# Patient Record
Sex: Female | Born: 1950
Health system: Southern US, Community
[De-identification: ages and names within clinical notes are randomized; demographics above are authoritative.]

## PROBLEM LIST (undated history)

## (undated) DIAGNOSIS — R197 Diarrhea, unspecified: Secondary | ICD-10-CM

## (undated) DIAGNOSIS — Z95828 Presence of other vascular implants and grafts: Secondary | ICD-10-CM

## (undated) DIAGNOSIS — D509 Iron deficiency anemia, unspecified: Secondary | ICD-10-CM

## (undated) DIAGNOSIS — I700 Atherosclerosis of aorta: Secondary | ICD-10-CM

## (undated) DIAGNOSIS — C539 Malignant neoplasm of cervix uteri, unspecified: Secondary | ICD-10-CM

## (undated) DIAGNOSIS — F329 Major depressive disorder, single episode, unspecified: Secondary | ICD-10-CM

## (undated) DIAGNOSIS — M8580 Other specified disorders of bone density and structure, unspecified site: Secondary | ICD-10-CM

## (undated) DIAGNOSIS — N83209 Unspecified ovarian cyst, unspecified side: Secondary | ICD-10-CM

## (undated) DIAGNOSIS — Z923 Personal history of irradiation: Secondary | ICD-10-CM

## (undated) DIAGNOSIS — E876 Hypokalemia: Secondary | ICD-10-CM

## (undated) DIAGNOSIS — R630 Anorexia: Secondary | ICD-10-CM

## (undated) DIAGNOSIS — Z9221 Personal history of antineoplastic chemotherapy: Secondary | ICD-10-CM

## (undated) DIAGNOSIS — D61818 Other pancytopenia: Secondary | ICD-10-CM

## (undated) HISTORY — PX: OTHER SURGICAL HISTORY: SHX169

## (undated) HISTORY — PX: WISDOM TOOTH EXTRACTION: SHX21

---

## 1999-04-15 ENCOUNTER — Encounter: Admission: RE | Admit: 1999-04-15 | Discharge: 1999-04-15 | Payer: Self-pay | Admitting: Obstetrics & Gynecology

## 1999-05-14 ENCOUNTER — Ambulatory Visit (HOSPITAL_COMMUNITY): Admission: RE | Admit: 1999-05-14 | Discharge: 1999-05-14 | Payer: Self-pay | Admitting: Obstetrics

## 2004-09-02 ENCOUNTER — Encounter (INDEPENDENT_AMBULATORY_CARE_PROVIDER_SITE_OTHER): Payer: Self-pay | Admitting: Internal Medicine

## 2004-09-02 LAB — CONVERTED CEMR LAB: Pap Smear: NORMAL

## 2004-10-09 ENCOUNTER — Ambulatory Visit: Payer: Self-pay | Admitting: *Deleted

## 2004-10-09 ENCOUNTER — Ambulatory Visit: Payer: Self-pay | Admitting: Family Medicine

## 2004-10-14 ENCOUNTER — Ambulatory Visit (HOSPITAL_COMMUNITY): Admission: RE | Admit: 2004-10-14 | Discharge: 2004-10-14 | Payer: Self-pay | Admitting: Family Medicine

## 2004-10-14 DIAGNOSIS — N83209 Unspecified ovarian cyst, unspecified side: Secondary | ICD-10-CM | POA: Insufficient documentation

## 2004-10-16 ENCOUNTER — Ambulatory Visit: Payer: Self-pay | Admitting: Family Medicine

## 2004-10-23 ENCOUNTER — Ambulatory Visit: Payer: Self-pay | Admitting: Family Medicine

## 2004-11-20 ENCOUNTER — Ambulatory Visit: Payer: Self-pay | Admitting: Family Medicine

## 2006-11-08 ENCOUNTER — Emergency Department (HOSPITAL_COMMUNITY): Admission: EM | Admit: 2006-11-08 | Discharge: 2006-11-08 | Payer: Self-pay | Admitting: Emergency Medicine

## 2007-04-15 ENCOUNTER — Ambulatory Visit: Payer: Self-pay | Admitting: Internal Medicine

## 2007-04-15 DIAGNOSIS — A5901 Trichomonal vulvovaginitis: Secondary | ICD-10-CM

## 2007-04-16 ENCOUNTER — Encounter (INDEPENDENT_AMBULATORY_CARE_PROVIDER_SITE_OTHER): Payer: Self-pay | Admitting: Internal Medicine

## 2007-04-16 LAB — CONVERTED CEMR LAB: Chlamydia, Swab/Urine, PCR: NEGATIVE

## 2007-04-22 ENCOUNTER — Encounter (INDEPENDENT_AMBULATORY_CARE_PROVIDER_SITE_OTHER): Payer: Self-pay | Admitting: Internal Medicine

## 2007-07-29 ENCOUNTER — Emergency Department (HOSPITAL_COMMUNITY): Admission: EM | Admit: 2007-07-29 | Discharge: 2007-07-30 | Payer: Self-pay | Admitting: Emergency Medicine

## 2008-05-15 ENCOUNTER — Encounter (INDEPENDENT_AMBULATORY_CARE_PROVIDER_SITE_OTHER): Payer: Self-pay | Admitting: Internal Medicine

## 2008-05-24 ENCOUNTER — Ambulatory Visit: Payer: Self-pay | Admitting: Internal Medicine

## 2008-05-24 DIAGNOSIS — K029 Dental caries, unspecified: Secondary | ICD-10-CM | POA: Insufficient documentation

## 2008-05-25 ENCOUNTER — Encounter (INDEPENDENT_AMBULATORY_CARE_PROVIDER_SITE_OTHER): Payer: Self-pay | Admitting: Internal Medicine

## 2008-10-02 ENCOUNTER — Ambulatory Visit: Payer: Self-pay | Admitting: Internal Medicine

## 2008-10-02 DIAGNOSIS — K921 Melena: Secondary | ICD-10-CM

## 2008-10-02 DIAGNOSIS — M4000 Postural kyphosis, site unspecified: Secondary | ICD-10-CM | POA: Insufficient documentation

## 2008-10-02 LAB — CONVERTED CEMR LAB
ALT: 8 units/L (ref 0–35)
AST: 13 units/L (ref 0–37)
Albumin: 4 g/dL (ref 3.5–5.2)
Alkaline Phosphatase: 69 units/L (ref 39–117)
Basophils Absolute: 0 10*3/uL (ref 0.0–0.1)
Basophils Relative: 1 % (ref 0–1)
Bilirubin Urine: NEGATIVE
Blood in Urine, dipstick: NEGATIVE
Calcium: 8.8 mg/dL (ref 8.4–10.5)
Chlamydia, DNA Probe: NEGATIVE
Chloride: 107 meq/L (ref 96–112)
Eosinophils Absolute: 0.1 10*3/uL (ref 0.0–0.7)
Glucose, Urine, Semiquant: NEGATIVE
HDL: 65 mg/dL (ref >39)
KOH Prep: NEGATIVE
LDL Cholesterol: 101 mg/dL — ABNORMAL HIGH (ref 0–99)
MCHC: 31.7 g/dL (ref 30.0–36.0)
MCV: 92 fL (ref 78.0–100.0)
Neutro Abs: 3.1 10*3/uL (ref 1.7–7.7)
Neutrophils Relative %: 55 % (ref 43–77)
Platelets: 204 10*3/uL (ref 150–400)
Potassium: 3.9 meq/L (ref 3.5–5.3)
Protein, U semiquant: NEGATIVE
Sodium: 143 meq/L (ref 135–145)
Specific Gravity, Urine: 1.015
Total Protein: 6.6 g/dL (ref 6.0–8.3)
WBC Urine, dipstick: NEGATIVE
Whiff Test: NEGATIVE

## 2008-10-10 ENCOUNTER — Encounter (INDEPENDENT_AMBULATORY_CARE_PROVIDER_SITE_OTHER): Payer: Self-pay | Admitting: Internal Medicine

## 2008-10-10 ENCOUNTER — Ambulatory Visit (HOSPITAL_COMMUNITY): Admission: RE | Admit: 2008-10-10 | Discharge: 2008-10-10 | Payer: Self-pay | Admitting: Internal Medicine

## 2008-10-10 DIAGNOSIS — M810 Age-related osteoporosis without current pathological fracture: Secondary | ICD-10-CM | POA: Insufficient documentation

## 2009-02-14 ENCOUNTER — Encounter (INDEPENDENT_AMBULATORY_CARE_PROVIDER_SITE_OTHER): Payer: Self-pay | Admitting: Internal Medicine

## 2009-02-14 ENCOUNTER — Telehealth (INDEPENDENT_AMBULATORY_CARE_PROVIDER_SITE_OTHER): Payer: Self-pay | Admitting: Internal Medicine

## 2012-01-25 ENCOUNTER — Encounter (HOSPITAL_COMMUNITY): Payer: Self-pay | Admitting: *Deleted

## 2012-01-25 ENCOUNTER — Emergency Department (HOSPITAL_COMMUNITY): Payer: No Typology Code available for payment source

## 2012-01-25 ENCOUNTER — Emergency Department (HOSPITAL_COMMUNITY)
Admission: EM | Admit: 2012-01-25 | Discharge: 2012-01-25 | Disposition: A | Discharge status: Home / Self Care | Payer: No Typology Code available for payment source | Attending: Emergency Medicine | Admitting: Emergency Medicine

## 2012-01-25 DIAGNOSIS — S301XXA Contusion of abdominal wall, initial encounter: Secondary | ICD-10-CM | POA: Insufficient documentation

## 2012-01-25 DIAGNOSIS — R51 Headache: Secondary | ICD-10-CM | POA: Insufficient documentation

## 2012-01-25 DIAGNOSIS — R079 Chest pain, unspecified: Secondary | ICD-10-CM | POA: Insufficient documentation

## 2012-01-25 MED ORDER — IOHEXOL 300 MG/ML  SOLN
80.0000 mL | Freq: Once | INTRAMUSCULAR | Status: AC | PRN
  Administered 2012-01-25: 80 mL via INTRAVENOUS

## 2012-01-25 MED ORDER — IOHEXOL 300 MG/ML  SOLN: 80.0000 mL | Freq: Once | INTRAMUSCULAR | Status: DC | PRN

## 2012-01-25 MED ORDER — HYDROCODONE-ACETAMINOPHEN 5-325 MG PO TABS: 1.0000 | ORAL_TABLET | ORAL | Status: AC | PRN

## 2012-01-25 MED ORDER — MORPHINE SULFATE 2 MG/ML IJ SOLN
2.0000 mg | Freq: Once | INTRAMUSCULAR | Status: AC
  Administered 2012-01-25: 2 mg via INTRAVENOUS
  Filled 2012-01-25: qty 1
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
## 2012-01-27 NOTE — ED Provider Notes (Signed)
Medical screening examination/treatment/procedure(s) were performed by non-physician practitioner and as supervising physician I was immediately available for consultation/collaboration.  Delvina Mizzell R. Terisa Belardo, MD 01/27/12 1535 

## 2014-01-10 ENCOUNTER — Emergency Department (INDEPENDENT_AMBULATORY_CARE_PROVIDER_SITE_OTHER)
Admission: EM | Admit: 2014-01-10 | Discharge: 2014-01-10 | Disposition: A | Discharge status: Home / Self Care | Payer: Self-pay | Source: Home / Self Care | Attending: Family Medicine | Admitting: Family Medicine

## 2014-01-10 ENCOUNTER — Encounter (HOSPITAL_COMMUNITY): Payer: Self-pay | Admitting: Emergency Medicine

## 2014-01-10 DIAGNOSIS — Z202 Contact with and (suspected) exposure to infections with a predominantly sexual mode of transmission: Secondary | ICD-10-CM

## 2014-01-10 NOTE — ED Provider Notes (Signed)
Erin Kent is a 63 y.o. female who presents to Urgent Care today for exposure to genital warts. Patient's boyfriend was tested positive for genital warts about one week ago. He was negative for HIV, syphilis, gonorrhea and Chlamydia. The patient is completely asymptomatic but would like evaluation for genital warts. She denies any fevers or chills nausea vomiting or diarrhea. She denies any vaginal discharge or pain. She denies any vaginal lesions. She notes her last Pap smear was over 5 years ago.   History reviewed. No pertinent past medical history. History  Substance Use Topics  . Smoking status: Never Smoker   . Smokeless tobacco: Not on file  . Alcohol Use: No   ROS as above Medications: No current facility-administered medications for this encounter.   Current Outpatient Prescriptions  Medication Sig Dispense Refill  . aspirin 325 MG tablet Take 325 mg by mouth daily as needed. headache      . loratadine (CLARITIN) 10 MG tablet Take 10 mg by mouth daily as needed. allergies      . VITAMIN E PO Take 1 tablet by mouth daily.        Exam:  BP 145/68  Pulse 67  Temp(Src) 98.6 F (37 C) (Oral)  Resp 16  SpO2 100% Gen: Well NAD GYN: Normal external genitalia with no lesions present.  No results found for this or any previous visit (from the past 24 hour(s)). No results found.  Assessment and Plan: 63 y.o. female with exposure to genital warts. Reassured patient. Recommend return if she develops symptoms. Recommend HIV and syphilis testing as well as gonorrhea and Chlamydia testing at the health Department if needed.  Discussed warning signs or symptoms. Please see discharge instructions. Patient expresses understanding.    Gregor Hams, MD 01/10/14 1059

## 2014-01-10 NOTE — ED Notes (Signed)
Pt wants to be screened for STD Reports partner is being treated for genital warts She is asymptomatic Alert w/no signs of acute distress.

## 2014-01-10 NOTE — Discharge Instructions (Signed)
Thank you for coming in today. Return if you develop genital warts.  Followup at the health department for HIV and syphilis testing soon.  I recommend a Pap smear later on this year.   Genital Warts Genital warts are a sexually transmitted infection. They may appear as small bumps on the tissues of the genital area. CAUSES  Genital warts are caused by a virus called human papillomavirus (HPV). HPV is the most common sexually transmitted disease (STD) and infection of the sex organs. This infection is spread by having unprotected sex with an infected person. It can be spread by vaginal, anal, and oral sex. Many people do not know they are infected. They may be infected for years without problems. However, even if they do not have problems, they can unknowingly pass the infection to their sexual partners. SYMPTOMS   Itching and irritation in the genital area.  Warts that bleed.  Painful sexual intercourse. DIAGNOSIS  Warts are usually recognized with the naked eye on the vagina, vulva, perineum, anus, and rectum. Certain tests can also diagnose genital warts, such as:  A Pap test.  A tissue sample (biopsy) exam.  Colposcopy. A magnifying tool is used to examine the vagina and cervix. The HPV cells will change color when certain solutions are used. TREATMENT  Warts can be removed by:  Applying certain chemicals, such as cantharidin or podophyllin.  Liquid nitrogen freezing (cryotherapy).  Immunotherapy with candida or trichophyton injections.  Laser treatment.  Burning with an electrified probe (electrocautery).  Interferon injections.  Surgery. PREVENTION  HPV vaccination can help prevent HPV infections that cause genital warts and that cause cancer of the cervix. It is recommended that the vaccination be given to people between the ages 3 to 60 years old. The vaccine might not work as well or might not work at all if you already have HPV. It should not be given to pregnant  women. HOME CARE INSTRUCTIONS   It is important to follow your caregiver's instructions. The warts will not go away without treatment. Repeat treatments are often needed to get rid of warts. Even after it appears that the warts are gone, the normal tissue underneath often remains infected.  Do not try to treat genital warts with medicine used to treat hand warts. This type of medicine is strong and can burn the skin in the genital area, causing more damage.  Tell your past and current sexual partner(s) that you have genital warts. They may be infected also and need treatment.  Avoid sexual contact while being treated.  Do not touch or scratch the warts. The infection may spread to other parts of your body.  Women with genital warts should have a cervical cancer check (Pap test) at least once a year. This type of cancer is slow-growing and can be cured if found early. Chances of developing cervical cancer are increased with HPV.  Inform your obstetrician about your warts in the event of pregnancy. This virus can be passed to the baby's respiratory tract. Discuss this with your caregiver.  Use a condom during sexual intercourse. Following treatment, the use of condoms will help prevent reinfection.  Ask your caregiver about using over-the-counter anti-itch creams. SEEK MEDICAL CARE IF:   Your treated skin becomes red, swollen, or painful.  You have a fever.  You feel generally ill.  You feel little lumps in and around your genital area.  You are bleeding or have painful sexual intercourse. MAKE SURE YOU:   Understand these instructions.  Will watch your condition.  Will get help right away if you are not doing well or get worse. Document Released: 08/28/2000 Document Revised: 11/23/2011 Document Reviewed: 03/09/2011 [facility] Patient Information 2014 [place], [place].

## 2014-01-10 NOTE — ED Notes (Signed)
Patient denies pain and is resting comfortably.  

## 2014-01-10 NOTE — ED Notes (Signed)
Went to d/c pt but was not in exam room Left w/o d/c instructions.

## 2017-04-17 ENCOUNTER — Emergency Department (HOSPITAL_COMMUNITY)
Admission: EM | Admit: 2017-04-17 | Discharge: 2017-04-18 | Disposition: A | Discharge status: Home / Self Care | Payer: Self-pay | Attending: Emergency Medicine | Admitting: Emergency Medicine

## 2017-04-17 ENCOUNTER — Encounter (HOSPITAL_COMMUNITY): Payer: Self-pay

## 2017-04-17 DIAGNOSIS — R42 Dizziness and giddiness: Secondary | ICD-10-CM

## 2017-04-17 DIAGNOSIS — Z7982 Long term (current) use of aspirin: Secondary | ICD-10-CM | POA: Insufficient documentation

## 2017-04-17 DIAGNOSIS — E860 Dehydration: Secondary | ICD-10-CM | POA: Insufficient documentation

## 2017-04-17 DIAGNOSIS — N390 Urinary tract infection, site not specified: Secondary | ICD-10-CM | POA: Insufficient documentation

## 2017-04-17 LAB — URINALYSIS, ROUTINE W REFLEX MICROSCOPIC
BILIRUBIN URINE: NEGATIVE
Glucose, UA: NEGATIVE mg/dL
KETONES UR: 5 mg/dL — AB
Nitrite: NEGATIVE
PROTEIN: 30 mg/dL — AB
SQUAMOUS EPITHELIAL / LPF: NONE SEEN
Specific Gravity, Urine: 1.014 (ref 1.005–1.030)
pH: 6 (ref 5.0–8.0)

## 2017-04-17 LAB — CBC
HEMATOCRIT: 38.3 % (ref 36.0–46.0)
HEMOGLOBIN: 12.6 g/dL (ref 12.0–15.0)
MCH: 28.4 pg (ref 26.0–34.0)
MCHC: 32.9 g/dL (ref 30.0–36.0)
MCV: 86.5 fL (ref 78.0–100.0)
Platelets: 242 10*3/uL (ref 150–400)
RBC: 4.43 MIL/uL (ref 3.87–5.11)
RDW: 12.4 % (ref 11.5–15.5)
WBC: 15.1 10*3/uL — AB (ref 4.0–10.5)

## 2017-04-17 LAB — BASIC METABOLIC PANEL
Anion gap: 9 (ref 5–15)
BUN: 14 mg/dL (ref 6–20)
CO2: 24 mmol/L (ref 22–32)
Calcium: 9.1 mg/dL (ref 8.9–10.3)
Chloride: 105 mmol/L (ref 101–111)
Creatinine, Ser: 0.81 mg/dL (ref 0.44–1.00)
GFR calc non Af Amer: >60 mL/min (ref >60)
Glucose, Bld: 104 mg/dL — ABNORMAL HIGH (ref 65–99)
POTASSIUM: 4.3 mmol/L (ref 3.5–5.1)
SODIUM: 138 mmol/L (ref 135–145)

## 2017-04-17 LAB — I-STAT TROPONIN, ED: TROPONIN I, POC: 0.01 ng/mL (ref 0.00–0.08)

## 2017-04-17 MED ORDER — CEPHALEXIN 500 MG PO CAPS: 500.0000 mg | ORAL_CAPSULE | Freq: Two times a day (BID) | ORAL | 0 refills | Status: AC

## 2017-04-17 MED ORDER — SODIUM CHLORIDE 0.9 % IV BOLUS (SEPSIS)
500.0000 mL | Freq: Once | INTRAVENOUS | Status: AC
  Administered 2017-04-17: 500 mL via INTRAVENOUS

## 2017-04-17 NOTE — Discharge Instructions (Signed)
As discussed, make sure you stay well-hydrated drinking enough fluids to keep your urine clear. Take your entire course of antibiotics even if feeling better.  Follow-up with the [facility]. Return to the emergency department if you experience any worsening dizziness, nausea, vomiting, bad headache, chest pain, shortness of breath or any other new concerning symptoms in the meantime.

## 2017-04-17 NOTE — ED Notes (Signed)
EKG given to EDP,Tegeler,MD., for review. 

## 2017-04-17 NOTE — ED Triage Notes (Signed)
Pt at work.  Started feeling weak and dizzy.  Felt better sitting down.  No chest pain.  No n/v or other symptoms.

## 2017-04-17 NOTE — ED Provider Notes (Signed)
[facility] DEPT Provider Note   CSN: [id number] Arrival date & time: 04/17/17  1726     History   Chief Complaint Chief Complaint  Patient presents with  . Weakness  . Dizziness    HPI Erin Kent is a 66 y.o. female presenting with sudden onset lightheadedness and presyncope while at work today. She reports that she was standing at the counter when she also and felt very hot she went to turn on the fan to higher setting and suddenly felt light headed and as though she was going to pass out. EMS was called and she refused transport. Advised her to be seen in the emergency department. She brought a note from EMS stating her blood pressure was 90/70, heart rate of 110. She was brought to the emergency department by her manager. She reports diarrhea for a day and has not been drinking as much water as she should. She denies fever, chills, nausea, vomiting, diaphoresis, visual changes, headache, chest pain, shortness of breath or any other symptoms. She denies any recurrent symptoms since the episode. She reports feeling well at this time. Denies any past medical history or history of cardiac disease in the family.  HPI  History reviewed. No pertinent past medical history.  Patient Active Problem List   Diagnosis Date Noted  . OSTEOPOROSIS 10/10/2008  . HEMOCCULT POSITIVE STOOL 10/02/2008  . KYPHOSIS 10/02/2008  . DENTAL CARIES 05/24/2008  . 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 OVARIAN CYST, LEFT 10/14/2004    History reviewed. No pertinent surgical history.  OB History    No data available       Home Medications    Prior to Admission medications   Medication Sig Start Date End Date Taking? Authorizing Provider  aspirin 325 MG tablet Take 325 mg by mouth daily as needed. headache    [provider]  cephALEXin (KEFLEX) 500 MG capsule Take 1 capsule (500 mg total) by mouth 2 (two) times daily. 04/17/17 04/24/17  Avie Echevaria B, PA-C  loratadine (CLARITIN) 10  MG tablet Take 10 mg by mouth daily as needed. allergies    [provider]  VITAMIN E PO Take 1 tablet by mouth daily.    [provider]    Family History History reviewed. No pertinent family history.  Social History Social History  Substance Use Topics  . Smoking status: Never Smoker  . Smokeless tobacco: Never Used  . Alcohol use No     Allergies   Patient has no known allergies.   Review of Systems Review of Systems  Constitutional: Negative for chills and fever.  HENT: Negative for tinnitus and trouble swallowing.   Eyes: Negative for pain and visual disturbance.  Respiratory: Negative for cough, choking, chest tightness, shortness of breath, wheezing and stridor.   Cardiovascular: Negative for chest pain, palpitations and leg swelling.  Gastrointestinal: Positive for diarrhea. Negative for abdominal distention, abdominal pain, blood in stool, nausea and vomiting.  Musculoskeletal: Negative for arthralgias, back pain, gait problem, myalgias, neck pain and neck stiffness.  Skin: Negative for color change, pallor and rash.  Neurological: Positive for dizziness, weakness and light-headedness. Negative for tremors, seizures, syncope, facial asymmetry, speech difficulty, numbness and headaches.       She describes the dizziness as more lightheaded as though she was going to pass out.     Physical Exam Updated Vital Signs BP 115/76 (BP Location: Left Arm)   Pulse 75   Temp 98.9 F (37.2 C) (Oral)   Resp 12  Ht 5\' 2"  (1.575 m)   Wt 46.7 kg (103 lb)   SpO2 100%   BMI 18.84 kg/m   Physical Exam  Constitutional: She is oriented to person, place, and time. She appears well-developed and well-nourished. No distress.  She was afebrile, cachectic, nontoxic appearing lying comfortably in bed in no acute distress.  HENT:  Head: Normocephalic and atraumatic.  Right Ear: External ear normal.  Left Ear: External ear normal.  Nose: Nose normal.    Mouth/Throat: Oropharynx is clear and moist. No oropharyngeal exudate.  Eyes: Pupils are equal, round, and reactive to light. Conjunctivae and EOM are normal. Right eye exhibits no discharge. Left eye exhibits no discharge.  Neck: Normal range of motion. Neck supple.  Cardiovascular: Normal rate, regular rhythm, normal heart sounds and intact distal pulses.   No murmur heard. Pulmonary/Chest: Effort normal and breath sounds normal. No stridor. No respiratory distress. She has no wheezes. She has no rales.  Abdominal: Soft. She exhibits no distension. There is no tenderness. There is no guarding.  Musculoskeletal: Normal range of motion. She exhibits no edema, tenderness or deformity.  Neurological: She is alert and oriented to person, place, and time. No cranial nerve deficit or sensory deficit. She exhibits normal muscle tone. Coordination normal.  Neurologic Exam:  - Mental status: Patient is alert and cooperative. Fluent speech and words are clear. Coherent thought processes and insight is good. Patient is oriented x 4 to person, place, time and event.  - Cranial nerves:  CN III, IV, VI: pupils equally round, reactive to light both direct and conscensual. Full extra-ocular movement. CN V: motor temporalis and masseter strength intact. CN VII : muscles of facial expression intact. CN X :  midline uvula. XI strength of sternocleidomastoid and trapezius muscles 5/5, XII: tongue is midline when protruded. - Motor: No involuntary movements. Muscle tone and bulk normal throughout. Muscle strength is 5/5 in bilateral shoulder abduction, elbow flexion and extension, grip, hip extension, flexion, leg flexion and extension, ankle dorsiflexion and plantar flexion.  - Sensory: Proprioception, light tough sensation intact in all extremities.  - Cerebellar: rapid alternating movements and point to point movement intact in upper and lower extremities. Normal stance and gait.  Skin: Skin is warm and dry. No rash  noted. She is not diaphoretic. No erythema. No pallor.  Psychiatric: She has a normal mood and affect.  Nursing note and vitals reviewed.    ED Treatments / Results  Labs (all labs ordered are listed, but only abnormal results are displayed) Labs Reviewed  BASIC METABOLIC PANEL - Abnormal; Notable for the following:       Result Value   Glucose, Bld 104 (*)    All other components within normal limits  CBC - Abnormal; Notable for the following:    WBC 15.1 (*)    All other components within normal limits  URINALYSIS, ROUTINE W REFLEX MICROSCOPIC - Abnormal; Notable for the following:    APPearance CLOUDY (*)    Hgb urine dipstick LARGE (*)    Ketones, ur 5 (*)    Protein, ur 30 (*)    Leukocytes, UA LARGE (*)    Bacteria, UA RARE (*)    All other components within normal limits  URINE CULTURE  I-STAT TROPONIN, ED  CBG MONITORING, ED    EKG  EKG Interpretation  Date/Time:  Saturday April 17 2017 20:58:55 EDT Ventricular Rate:  75 PR Interval:    QRS Duration: 92 QT Interval:  391 QTC Calculation: 437  R Axis:   -53 Text Interpretation:  Sinus rhythm Borderline prolonged PR interval LAD, consider left anterior fascicular block When compared to prior, no significant changes seen.  No STEMI Confirmed by Antony Blackbird [phone]) on 04/17/2017 9:11:02 PM       Radiology No results found.  Procedures Procedures (including critical care time)  Medications Ordered in ED Medications  sodium chloride 0.9 % bolus 500 mL (0 mLs Intravenous Stopped 04/17/17 2346)     Initial Impression / Assessment and Plan / ED Course  I have reviewed the triage vital signs and the nursing notes.  Pertinent labs & imaging results that were available during my care of the patient were reviewed by me and considered in my medical decision making (see chart for details).    Patient presenting with presyncope while at work today. Denies any past medical history. She has been feeling well since  the episode without any recurrence. No headaches, visual disturbances, nausea/ vomiting. Normal neuro exam  EKG unchanged from prior tracing, negative troponin. Heart score: 2, no chest pain or shortness of breath.  Given IV fluids, will reassess.  On reassessment, patient reported significant improvement. Patient did not experience any symptoms and improved throughout her stay in the emergency department. Urine with signs of infection and dehydration. Workup otherwise unremarkable.  Will discharge home with Keflex and close follow-up with the wellness Center. Patient was well-appearing, afebrile, nontoxic and ambulatory in the emergency department without difficulty. Patient was ready go home in stable prior to discharge.  Discussed strict return precautions and advised to return to the emergency department if experiencing any new or worsening symptoms. Instructions were understood and patient agreed with discharge plan.  Final Clinical Impressions(s) / ED Diagnoses   Final diagnoses:  Dehydration  Lightheadedness  Lower urinary tract infectious disease    New Prescriptions Discharge Medication List as of 04/17/2017 11:33 PM    START taking these medications   Details  cephALEXin (KEFLEX) 500 MG capsule Take 1 capsule (500 mg total) by mouth 2 (two) times daily., Starting Sat 04/17/2017, Until Sat 04/24/2017, Print         Emeline General, PA-C 04/18/17 0300    Tegeler, Gwenyth Allegra, MD 04/18/17 [phone]

## 2017-04-19 LAB — URINE CULTURE

## 2018-03-13 ENCOUNTER — Encounter (HOSPITAL_COMMUNITY): Payer: Self-pay | Admitting: Emergency Medicine

## 2018-03-13 ENCOUNTER — Inpatient Hospital Stay (HOSPITAL_COMMUNITY)
Admission: EM | Admit: 2018-03-13 | Discharge: 2018-03-13 | Disposition: A | Discharge status: Home / Self Care | Payer: Medicaid Other | Attending: Obstetrics and Gynecology | Admitting: Obstetrics and Gynecology

## 2018-03-13 ENCOUNTER — Emergency Department (HOSPITAL_COMMUNITY): Payer: Medicaid Other

## 2018-03-13 DIAGNOSIS — N950 Postmenopausal bleeding: Secondary | ICD-10-CM | POA: Diagnosis present

## 2018-03-13 DIAGNOSIS — D62 Acute posthemorrhagic anemia: Secondary | ICD-10-CM

## 2018-03-13 LAB — COMPREHENSIVE METABOLIC PANEL
ALT: 8 U/L (ref 0–44)
AST: 19 U/L (ref 15–41)
Albumin: 3.8 g/dL (ref 3.5–5.0)
Alkaline Phosphatase: 57 U/L (ref 38–126)
Anion gap: 8 (ref 5–15)
BUN: 9 mg/dL (ref 8–23)
CO2: 28 mmol/L (ref 22–32)
Calcium: 9.2 mg/dL (ref 8.9–10.3)
Chloride: 105 mmol/L (ref 98–111)
Creatinine, Ser: 0.71 mg/dL (ref 0.44–1.00)
GFR calc non Af Amer: >60 mL/min (ref >60)
Glucose, Bld: 121 mg/dL — ABNORMAL HIGH (ref 70–99)
Potassium: 3.4 mmol/L — ABNORMAL LOW (ref 3.5–5.1)
SODIUM: 141 mmol/L (ref 135–145)
Total Bilirubin: 0.6 mg/dL (ref 0.3–1.2)
Total Protein: 7.4 g/dL (ref 6.5–8.1)

## 2018-03-13 LAB — CBC WITH DIFFERENTIAL/PLATELET
BASOS PCT: 1 %
Basophils Absolute: 0.1 10*3/uL (ref 0.0–0.1)
Eosinophils Absolute: 0.2 10*3/uL (ref 0.0–0.7)
Eosinophils Relative: 2 %
HEMATOCRIT: 32.7 % — AB (ref 36.0–46.0)
HEMOGLOBIN: 10.3 g/dL — AB (ref 12.0–15.0)
Lymphocytes Relative: 21 %
Lymphs Abs: 2.4 10*3/uL (ref 0.7–4.0)
MCH: 27 pg (ref 26.0–34.0)
MCHC: 31.5 g/dL (ref 30.0–36.0)
MCV: 85.6 fL (ref 78.0–100.0)
MONOS PCT: 7 %
Monocytes Absolute: 0.8 10*3/uL (ref 0.1–1.0)
NEUTROS ABS: 8.1 10*3/uL — AB (ref 1.7–7.7)
NEUTROS PCT: 69 %
Platelets: 317 10*3/uL (ref 150–400)
RBC: 3.82 MIL/uL — ABNORMAL LOW (ref 3.87–5.11)
RDW: 13.4 % (ref 11.5–15.5)
WBC: 11.5 10*3/uL — ABNORMAL HIGH (ref 4.0–10.5)

## 2018-03-13 LAB — TYPE AND SCREEN
ABO/RH(D): O POS
Antibody Screen: NEGATIVE

## 2018-03-13 LAB — ABO/RH: ABO/RH(D): O POS

## 2018-03-13 LAB — PROTIME-INR
INR: 0.91
Prothrombin Time: 12.2 seconds (ref 11.4–15.2)

## 2018-03-13 MED ORDER — SODIUM CHLORIDE 0.9 % IV BOLUS: 1000.0000 mL | Freq: Once | INTRAVENOUS | Status: DC

## 2018-03-13 MED ORDER — SODIUM CHLORIDE 0.9 % IV BOLUS
1000.0000 mL | Freq: Once | INTRAVENOUS | Status: AC
  Administered 2018-03-13: 1000 mL via INTRAVENOUS

## 2018-03-13 MED ORDER — SODIUM CHLORIDE 0.9 % IV SOLN
INTRAVENOUS | Status: DC
  Administered 2018-03-13: 13:00:00 via INTRAVENOUS

## 2018-03-13 MED ORDER — MEGESTROL ACETATE 40 MG PO TABS: ORAL_TABLET | ORAL | 0 refills | Status: DC

## 2018-03-13 MED ORDER — MEGESTROL ACETATE 40 MG PO TABS
160.0000 mg | ORAL_TABLET | Freq: Every day | ORAL | Status: DC
  Administered 2018-03-13: 160 mg via ORAL
  Filled 2018-03-13: qty 4

## 2018-03-13 MED ORDER — MEGESTROL ACETATE 40 MG PO TABS: 40.0000 mg | ORAL_TABLET | Freq: Every day | ORAL | Status: DC

## 2018-03-13 NOTE — Discharge Instructions (Signed)

## 2018-03-13 NOTE — MAU Provider Note (Signed)
Chief Complaint: Vaginal Bleeding   SUBJECTIVE HPI: Erin Kent is a 67 y.o. G0P0000 who presents to MAU from ED for postmenopausal vaginal bleeding.  Patient states that today while she was at church she felt like she had to go to the bathroom.  When she went to the bathroom she had a large amount of bleeding.  Patient denies any trauma.  She is not sexually active.  She denies being on blood thinners.  States that she has never had any postmenopausal bleeding.  Believes her last menstrual period was in her 62s.  Denies any fevers, chills, pain, dysuria or vaginal discharge.  Since bleeding started it has been heavy with large clots.  Patient has soaked through her clothes and pads.  While in the prior ED patient received fluid bolus of 2 L.  Blood work and ultrasound also completed.  Patient denies having a OB/GYN doctor.  Past Medical History:  Diagnosis Date  . Medical history non-contributory    OB History  Gravida Para Term Preterm AB Living  0 0 0 0 0 0  SAB TAB Ectopic Multiple Live Births  0 0 0 0 0   Past Surgical History:  Procedure Laterality Date  . NO PAST SURGERIES     Social History   Socioeconomic History  . Marital status: Single    Spouse name: Not on file  . Number of children: Not on file  . Years of education: Not on file  . Highest education level: Not on file  Occupational History  . Not on file  Social Needs  . Financial resource strain: Not on file  . Food insecurity:    Worry: Not on file    Inability: Not on file  . Transportation needs:    Medical: Not on file    Non-medical: Not on file  Tobacco Use  . Smoking status: Never Smoker  . Smokeless tobacco: Never Used  Substance and Sexual Activity  . Alcohol use: No  . Drug use: No  . Sexual activity: Not on file  Lifestyle  . Physical activity:    Days per week: Not on file    Minutes per session: Not on file  . Stress: Not on file  Relationships  . Social connections:    Talks on  phone: Not on file    Gets together: Not on file    Attends religious service: Not on file    Active member of club or organization: Not on file    Attends meetings of clubs or organizations: Not on file    Relationship status: Not on file  . Intimate partner violence:    Fear of current or ex partner: Not on file    Emotionally abused: Not on file    Physically abused: Not on file    Forced sexual activity: Not on file  Other Topics Concern  . Not on file  Social History Narrative  . Not on file   No current facility-administered medications on file prior to encounter.    Current Outpatient Medications on File Prior to Encounter  Medication Sig Dispense Refill  . Multiple Vitamins-Minerals (CENTRUM SILVER ULTRA WOMENS PO) Take 1 tablet by mouth daily.     No Known Allergies  I have reviewed the past Medical Hx, Surgical Hx, Social Hx, Allergies and Medications.   REVIEW OF SYSTEMS All systems reviewed and are negative for acute change except as noted in the HPI.   OBJECTIVE BP 133/73   Pulse 80  Temp 98.2 F (36.8 C) (Oral)   Resp 18   SpO2 100%    PHYSICAL EXAM Constitutional: Well-developed, well-nourished female in no acute distress.  Cardiovascular: normal rate and rhythm, pulses intact Respiratory: normal rate and effort.  GI: Abd soft, non-tender, non-distended. MS: Extremities nontender, no edema, normal ROM Neurologic: Alert and oriented x 4. No focal deficits Pelvic/SPECULUM EXAM: atrophy of vaginal tissue. Large 7cm clot at vaginal introitus. Unable to tolerate pediatric speculum exam. Able to advance some and see clot in vagina but patient unable to finish exam. Cervix not visualized. Psych: normal mood and affect  LAB RESULTS Results for orders placed or performed during the hospital encounter of 03/13/18 (from the past 24 hour(s))  Comprehensive metabolic panel     Status: Abnormal   Collection Time: 03/13/18 11:25 AM  Result Value Ref Range    Sodium 141 135 - 145 mmol/L   Potassium 3.4 (L) 3.5 - 5.1 mmol/L   Chloride 105 98 - 111 mmol/L   CO2 28 22 - 32 mmol/L   Glucose, Bld 121 (H) 70 - 99 mg/dL   BUN 9 8 - 23 mg/dL   Creatinine, Ser 0.71 0.44 - 1.00 mg/dL   Calcium 9.2 8.9 - 10.3 mg/dL   Total Protein 7.4 6.5 - 8.1 g/dL   Albumin 3.8 3.5 - 5.0 g/dL   AST 19 15 - 41 U/L   ALT 8 0 - 44 U/L   Alkaline Phosphatase 57 38 - 126 U/L   Total Bilirubin 0.6 0.3 - 1.2 mg/dL   GFR calc non Af Amer >60 >60 mL/min   GFR calc Af Amer >60 >60 mL/min   Anion gap 8 5 - 15  CBC WITH DIFFERENTIAL     Status: Abnormal   Collection Time: 03/13/18 11:25 AM  Result Value Ref Range   WBC 11.5 (H) 4.0 - 10.5 K/uL   RBC 3.82 (L) 3.87 - 5.11 MIL/uL   Hemoglobin 10.3 (L) 12.0 - 15.0 g/dL   HCT 32.7 (L) 36.0 - 46.0 %   MCV 85.6 78.0 - 100.0 fL   MCH 27.0 26.0 - 34.0 pg   MCHC 31.5 30.0 - 36.0 g/dL   RDW 13.4 11.5 - 15.5 %   Platelets 317 150 - 400 K/uL   Neutrophils Relative % 69 %   Neutro Abs 8.1 (H) 1.7 - 7.7 K/uL   Lymphocytes Relative 21 %   Lymphs Abs 2.4 0.7 - 4.0 K/uL   Monocytes Relative 7 %   Monocytes Absolute 0.8 0.1 - 1.0 K/uL   Eosinophils Relative 2 %   Eosinophils Absolute 0.2 0.0 - 0.7 K/uL   Basophils Relative 1 %   Basophils Absolute 0.1 0.0 - 0.1 K/uL  Protime-INR     Status: None   Collection Time: 03/13/18 11:25 AM  Result Value Ref Range   Prothrombin Time 12.2 11.4 - 15.2 seconds   INR 0.91   Type and screen [place]     Status: None   Collection Time: 03/13/18 11:26 AM  Result Value Ref Range   ABO/RH(D) O POS    Antibody Screen NEG    Sample Expiration      03/16/2018 Performed at [facility], [place] [address], [place], [place] [address]     IMAGING US Transvaginal Non-ob  Result Date: 03/13/2018 CLINICAL DATA:  Patient with postmenopausal bleeding.  Pain. EXAM: TRANSABDOMINAL AND TRANSVAGINAL ULTRASOUND OF PELVIS TECHNIQUE: Both transabdominal and  transvaginal ultrasound examinations of the  pelvis were performed. Transabdominal technique was performed for global imaging of the pelvis including uterus, ovaries, adnexal regions, and pelvic cul-de-sac. It was necessary to proceed with endovaginal exam following the transabdominal exam to visualize the endometrium. COMPARISON:  01/25/2012 CT CAP FINDINGS: Uterus Measurements: 9.2 x 3.9 x 4.6 cm. No fibroids or other mass visualized. Endometrium The endometrium is not well visualized. On transabdominal imaging, there is suggestion of mobile echogenic foci within the endometrial cavity, suggestive of gas. Right ovary Not visualized. Left ovary Not visualized. Other findings No abnormal free fluid. IMPRESSION: There are mobile echogenic foci within the endometrial cavity suggestive of gas. This is nonspecific in etiology however may be secondary to endometritis. Correlate for history of recent instrumentation. This obscures visualization of the endometrial tissue and therefore a follow-up pelvic ultrasound in 2-4 weeks is recommended after resolution of the acute symptomatology if symptoms persist to further evaluate the endometrium. Electronically Signed   By: Lovey Newcomer M.D.   On: 03/13/2018 14:15   US Pelvis Complete  Result Date: 03/13/2018 CLINICAL DATA:  Patient with postmenopausal bleeding.  Pain. EXAM: TRANSABDOMINAL AND TRANSVAGINAL ULTRASOUND OF PELVIS TECHNIQUE: Both transabdominal and transvaginal ultrasound examinations of the pelvis were performed. Transabdominal technique was performed for global imaging of the pelvis including uterus, ovaries, adnexal regions, and pelvic cul-de-sac. It was necessary to proceed with endovaginal exam following the transabdominal exam to visualize the endometrium. COMPARISON:  01/25/2012 CT CAP FINDINGS: Uterus Measurements: 9.2 x 3.9 x 4.6 cm. No fibroids or other mass visualized. Endometrium The endometrium is not well visualized. On transabdominal imaging, there  is suggestion of mobile echogenic foci within the endometrial cavity, suggestive of gas. Right ovary Not visualized. Left ovary Not visualized. Other findings No abnormal free fluid. IMPRESSION: There are mobile echogenic foci within the endometrial cavity suggestive of gas. This is nonspecific in etiology however may be secondary to endometritis. Correlate for history of recent instrumentation. This obscures visualization of the endometrial tissue and therefore a follow-up pelvic ultrasound in 2-4 weeks is recommended after resolution of the acute symptomatology if symptoms persist to further evaluate the endometrium. Electronically Signed   By: Lovey Newcomer M.D.   On: 03/13/2018 14:15    MAU Management/MDM: Vitals and nursing notes reviewed  VSS. Patient hemodynamically stable. Unable to perform speculum exam due to patient discomfort. Clots appreciated in vaginal introitus.  It is post 2 L normal saline.  Blood work reviewed which showed possible acute posthemorrhagic anemia.  Hemoglobin 10.3 down from 12.6 11 months ago.  PT/INR normal.  CMP unremarkable.  Mild leukocytosis not concerning for infection. [place] without acute pathology. Unable to fully view endometrium so patient needs follow-up US.   We will treat patient's postmenopausal bleeding with large dose of Megace here in the ED.  Patient given Rx for Megace to continue as prescribed.  Patient encouraged to follow-up as outpatient for work-up of postmenopausal bleeding to rule out endometrial cancer.  Plan of care reviewed with patient, including labs and tests ordered and medical treatment.  Consult Dr. Rip Harbour whom agrees with plan.  Treatments in MAU included Megace.   Orders Placed This Encounter  Procedures  . US Pelvis Complete  . US Transvaginal Non-OB  . Comprehensive metabolic panel  . CBC WITH DIFFERENTIAL  . Protime-INR  . Urinalysis, Routine w reflex microscopic  . Diet NPO time specified  . Orthostatic Vital signs  . Pelvic  cart  . Consult to obstetrics / gynecology  . Pulse oximetry, continuous  .  Type and screen [place]  . ABO/Rh  . Discharge patient Discharge disposition: 01-Home or Self Care; Discharge patient date: 03/13/2018    Meds ordered this encounter  Medications  . AND Linked Order Group   . sodium chloride 0.9 % bolus 1,000 mL   . 0.9 %  sodium chloride infusion  . sodium chloride 0.9 % bolus 1,000 mL  . DISCONTD: megestrol (MEGACE) tablet 40 mg  . megestrol (MEGACE) 40 MG tablet    Sig: Take 1 tablet (40 mg total) by mouth 3 (three) times daily for 5 days, THEN 1 tablet (40 mg total) 2 (two) times daily for 5 days, THEN 1 tablet (40 mg total) daily for 20 days.    Dispense:  45 tablet    Refill:  0  . megestrol (MEGACE) tablet 160 mg    ASSESSMENT 1. Post-menopausal bleeding   2. Acute post-hemorrhagic anemia     PLAN Discharge home in stable condition. Pt discharged with strict return precautions. Rx for Megace Information given to schedule appointment in clinic for follow-up Handout given  [place] for [facility] Schedule an appointment as soon as possible for a visit.   Specialty:  Obstetrics and Gynecology Why:  For visit for work-up of bleeding Contact information: [place] [phone]          Allergies as of 03/13/2018   No Known Allergies     Medication List    TAKE these medications   CENTRUM SILVER ULTRA WOMENS PO Take 1 tablet by mouth daily.   megestrol 40 MG tablet Commonly known as:  MEGACE Take 1 tablet (40 mg total) by mouth 3 (three) times daily for 5 days, THEN 1 tablet (40 mg total) 2 (two) times daily for 5 days, THEN 1 tablet (40 mg total) daily for 20 days. Start taking on:  03/13/2018       Luiz Blare, DO OB Fellow [facility], [facility] 03/13/2018, 5:30 PM

## 2018-03-13 NOTE — ED Provider Notes (Signed)
[place] DEPT Provider Note   CSN: [id number] Arrival date & time: 03/13/18  1039     History   Chief Complaint Chief Complaint  Patient presents with  . Vaginal Bleeding    HPI Erin Kent is a 67 y.o. female.  Pt presents to the ED today with vaginal bleeding.  Pt said it started suddenly while at church.  The pt denies any trauma.  She is not sexually active.  She is not on blood thinners.  She has not had any other post-menopausal bleeding.  She denies any pain.     History reviewed. No pertinent past medical history.  Patient Active Problem List   Diagnosis Date Noted  . OSTEOPOROSIS 10/10/2008  . HEMOCCULT POSITIVE STOOL 10/02/2008  . KYPHOSIS 10/02/2008  . DENTAL CARIES 05/24/2008  . 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 OVARIAN CYST, LEFT 10/14/2004    History reviewed. No pertinent surgical history.   OB History   None      Home Medications    Prior to Admission medications   Medication Sig Start Date End Date Taking? Authorizing Provider  Multiple Vitamins-Minerals (CENTRUM SILVER ULTRA WOMENS PO) Take 1 tablet by mouth daily.   Yes [provider]    Family History No family history on file.  Social History Social History   Tobacco Use  . Smoking status: Never Smoker  . Smokeless tobacco: Never Used  Substance Use Topics  . Alcohol use: No  . Drug use: No     Allergies   Patient has no known allergies.   Review of Systems Review of Systems  Genitourinary: Positive for vaginal bleeding.  All other systems reviewed and are negative.    Physical Exam Updated Vital Signs BP 137/74   Pulse 73   Temp 98.2 F (36.8 C) (Oral)   Resp 19   SpO2 100%   Physical Exam  Constitutional: She is oriented to person, place, and time. She appears well-developed and well-nourished.  HENT:  Head: Normocephalic and atraumatic.  Right Ear: External ear normal.  Left Ear: External ear normal.    Nose: Nose normal.  Mouth/Throat: Oropharynx is clear and moist.  Eyes: Pupils are equal, round, and reactive to light. Conjunctivae and EOM are normal.  Neck: Normal range of motion. Neck supple.  Cardiovascular: Normal rate, regular rhythm, normal heart sounds and intact distal pulses.  Pulmonary/Chest: Effort normal and breath sounds normal.  Abdominal: Soft. Bowel sounds are normal.  Genitourinary: There is bleeding in the vagina.  Genitourinary Comments: Pt unable to tolerate speculum exam even with the smallest speculum, so I was unable to see very well.  Large bloody clots removed from vagina.  Musculoskeletal: Normal range of motion.  Neurological: She is alert and oriented to person, place, and time.  Skin: Skin is warm. Capillary refill takes less than 2 seconds.  Psychiatric: She has a normal mood and affect. Her behavior is normal. Judgment and thought content normal.  Nursing note and vitals reviewed.    ED Treatments / Results  Labs (all labs ordered are listed, but only abnormal results are displayed) Labs Reviewed  COMPREHENSIVE METABOLIC PANEL - Abnormal; Notable for the following components:      Result Value   Potassium 3.4 (*)    Glucose, Bld 121 (*)    All other components within normal limits  CBC WITH DIFFERENTIAL/PLATELET - Abnormal; Notable for the following components:   WBC 11.5 (*)    RBC 3.82 (*)  Hemoglobin 10.3 (*)    HCT 32.7 (*)    Neutro Abs 8.1 (*)    All other components within normal limits  PROTIME-INR  TYPE AND SCREEN  ABO/RH    EKG None  Radiology US Transvaginal Non-ob  Result Date: 03/13/2018 CLINICAL DATA:  Patient with postmenopausal bleeding.  Pain. EXAM: TRANSABDOMINAL AND TRANSVAGINAL ULTRASOUND OF PELVIS TECHNIQUE: Both transabdominal and transvaginal ultrasound examinations of the pelvis were performed. Transabdominal technique was performed for global imaging of the pelvis including uterus, ovaries, adnexal regions, and  pelvic cul-de-sac. It was necessary to proceed with endovaginal exam following the transabdominal exam to visualize the endometrium. COMPARISON:  01/25/2012 CT CAP FINDINGS: Uterus Measurements: 9.2 x 3.9 x 4.6 cm. No fibroids or other mass visualized. Endometrium The endometrium is not well visualized. On transabdominal imaging, there is suggestion of mobile echogenic foci within the endometrial cavity, suggestive of gas. Right ovary Not visualized. Left ovary Not visualized. Other findings No abnormal free fluid. IMPRESSION: There are mobile echogenic foci within the endometrial cavity suggestive of gas. This is nonspecific in etiology however may be secondary to endometritis. Correlate for history of recent instrumentation. This obscures visualization of the endometrial tissue and therefore a follow-up pelvic ultrasound in 2-4 weeks is recommended after resolution of the acute symptomatology if symptoms persist to further evaluate the endometrium. Electronically Signed   By: Lovey Newcomer M.D.   On: 03/13/2018 14:15   US Pelvis Complete  Result Date: 03/13/2018 CLINICAL DATA:  Patient with postmenopausal bleeding.  Pain. EXAM: TRANSABDOMINAL AND TRANSVAGINAL ULTRASOUND OF PELVIS TECHNIQUE: Both transabdominal and transvaginal ultrasound examinations of the pelvis were performed. Transabdominal technique was performed for global imaging of the pelvis including uterus, ovaries, adnexal regions, and pelvic cul-de-sac. It was necessary to proceed with endovaginal exam following the transabdominal exam to visualize the endometrium. COMPARISON:  01/25/2012 CT CAP FINDINGS: Uterus Measurements: 9.2 x 3.9 x 4.6 cm. No fibroids or other mass visualized. Endometrium The endometrium is not well visualized. On transabdominal imaging, there is suggestion of mobile echogenic foci within the endometrial cavity, suggestive of gas. Right ovary Not visualized. Left ovary Not visualized. Other findings No abnormal free fluid.  IMPRESSION: There are mobile echogenic foci within the endometrial cavity suggestive of gas. This is nonspecific in etiology however may be secondary to endometritis. Correlate for history of recent instrumentation. This obscures visualization of the endometrial tissue and therefore a follow-up pelvic ultrasound in 2-4 weeks is recommended after resolution of the acute symptomatology if symptoms persist to further evaluate the endometrium. Electronically Signed   By: Lovey Newcomer M.D.   On: 03/13/2018 14:15    Procedures Procedures (including critical care time)  Medications Ordered in ED Medications  sodium chloride 0.9 % bolus 1,000 mL (0 mLs Intravenous Stopped 03/13/18 1240)    And  0.9 %  sodium chloride infusion ( Intravenous New Bag/Given 03/13/18 1241)  sodium chloride 0.9 % bolus 1,000 mL (has no administration in time range)     Initial Impression / Assessment and Plan / ED Course  I have reviewed the triage vital signs and the nursing notes.  Pertinent labs & imaging results that were available during my care of the patient were reviewed by me and considered in my medical decision making (see chart for details).    Pt d/w Dr. Laurena Bering (obgyn) who accepted pt for transfer to [facility] for him to eval as pt continues to pass large clots.   Final Clinical Impressions(s) / ED Diagnoses  Final diagnoses:  Post-menopausal bleeding  Acute post-hemorrhagic anemia    ED Discharge Orders    None       Isla Pence, MD 03/13/18 [phone]

## 2018-03-13 NOTE — ED Triage Notes (Signed)
Patient here from home with complaints sudden onset of vaginal bleeding that started this morning while at church.

## 2018-03-16 ENCOUNTER — Encounter: Payer: Self-pay | Admitting: Obstetrics and Gynecology

## 2018-03-16 ENCOUNTER — Ambulatory Visit (INDEPENDENT_AMBULATORY_CARE_PROVIDER_SITE_OTHER): Payer: Medicaid Other | Admitting: Obstetrics and Gynecology

## 2018-03-16 VITALS — BP 121/79 | HR 92 | Wt 86.1 lb

## 2018-03-16 DIAGNOSIS — N950 Postmenopausal bleeding: Secondary | ICD-10-CM | POA: Diagnosis present

## 2018-03-16 NOTE — Patient Instructions (Signed)
Please contact Etheleen Sia on Monday [phone] to enroll in [facility]

## 2018-03-16 NOTE — Progress Notes (Signed)
67 yo G0 here for the evaluation of postmenopausal vaginal bleeding. Patient reports experiencing vaginal bleeding on 6/30 while in church, soaking through her clothes with passage of clots. She is not sexually active. She has been menopausal for at least 10 years. She denies the use of blood thinners or trauma. She admits that her last pap smear was years ago. She is without any other complaints. She denies any pelvic pain.  Past Medical History:  Diagnosis Date  . Medical history non-contributory    Past Surgical History:  Procedure Laterality Date  . NO PAST SURGERIES     No family history on file. Social History   Tobacco Use  . Smoking status: Never Smoker  . Smokeless tobacco: Never Used  Substance Use Topics  . Alcohol use: No  . Drug use: No   ROS See pertinent in HPI  Blood pressure 121/79, pulse 92, weight 86 lb 1.6 oz (39.1 kg).  GENERAL: Well-developed, well-nourished female in no acute distress.  ABDOMEN: Soft, nontender, nondistended. No organomegaly. PELVIC: Normal external female genitalia. Vagina is atrophic.  Foul smelling discharge. Patient is unable to tolerate exam. Cervix could not be visualized. Patient did not tolerate bi-manual exam but friable cervical mass was appreciated as there was blood and tissue fragments on glove EXTREMITIES: No cyanosis, clubbing, or edema, 2+ distal pulses.  03/13/2018 ultrasound US Transvaginal Non-ob  Result Date: 03/13/2018 CLINICAL DATA:  Patient with postmenopausal bleeding.  Pain. EXAM: TRANSABDOMINAL AND TRANSVAGINAL ULTRASOUND OF PELVIS TECHNIQUE: Both transabdominal and transvaginal ultrasound examinations of the pelvis were performed. Transabdominal technique was performed for global imaging of the pelvis including uterus, ovaries, adnexal regions, and pelvic cul-de-sac. It was necessary to proceed with endovaginal exam following the transabdominal exam to visualize the endometrium. COMPARISON:  01/25/2012 CT CAP FINDINGS:  Uterus Measurements: 9.2 x 3.9 x 4.6 cm. No fibroids or other mass visualized. Endometrium The endometrium is not well visualized. On transabdominal imaging, there is suggestion of mobile echogenic foci within the endometrial cavity, suggestive of gas. Right ovary Not visualized. Left ovary Not visualized. Other findings No abnormal free fluid. IMPRESSION: There are mobile echogenic foci within the endometrial cavity suggestive of gas. This is nonspecific in etiology however may be secondary to endometritis. Correlate for history of recent instrumentation. This obscures visualization of the endometrial tissue and therefore a follow-up pelvic ultrasound in 2-4 weeks is recommended after resolution of the acute symptomatology if symptoms persist to further evaluate the endometrium. Electronically Signed   By: Lovey Newcomer M.D.   On: 03/13/2018 14:15   US Pelvis Complete  Result Date: 03/13/2018 CLINICAL DATA:  Patient with postmenopausal bleeding.  Pain. EXAM: TRANSABDOMINAL AND TRANSVAGINAL ULTRASOUND OF PELVIS TECHNIQUE: Both transabdominal and transvaginal ultrasound examinations of the pelvis were performed. Transabdominal technique was performed for global imaging of the pelvis including uterus, ovaries, adnexal regions, and pelvic cul-de-sac. It was necessary to proceed with endovaginal exam following the transabdominal exam to visualize the endometrium. COMPARISON:  01/25/2012 CT CAP FINDINGS: Uterus Measurements: 9.2 x 3.9 x 4.6 cm. No fibroids or other mass visualized. Endometrium The endometrium is not well visualized. On transabdominal imaging, there is suggestion of mobile echogenic foci within the endometrial cavity, suggestive of gas. Right ovary Not visualized. Left ovary Not visualized. Other findings No abnormal free fluid. IMPRESSION: There are mobile echogenic foci within the endometrial cavity suggestive of gas. This is nonspecific in etiology however may be secondary to endometritis. Correlate  for history of recent instrumentation. This obscures visualization  of the endometrial tissue and therefore a follow-up pelvic ultrasound in 2-4 weeks is recommended after resolution of the acute symptomatology if symptoms persist to further evaluate the endometrium. Electronically Signed   By: Lovey Newcomer M.D.   On: 03/13/2018 14:15    A/P 67 yo with postmenopausal vaginal bleeding - Expressed concerns regarding cervical malignancy  - Plan for exam under anesthesia for pap smear, cervical biopsy and endometrial biopsy if indicated - patient will meet with [facility] team pre-op for potential enrollment - Patient verbalized understanding and all questions were answered

## 2018-03-21 ENCOUNTER — Other Ambulatory Visit: Payer: Self-pay | Admitting: Obstetrics and Gynecology

## 2018-03-21 DIAGNOSIS — Z1231 Encounter for screening mammogram for malignant neoplasm of breast: Secondary | ICD-10-CM

## 2018-03-24 ENCOUNTER — Ambulatory Visit (HOSPITAL_COMMUNITY): Payer: Self-pay

## 2018-03-24 ENCOUNTER — Ambulatory Visit
Admission: RE | Admit: 2018-03-24 | Discharge: 2018-03-24 | Disposition: A | Discharge status: Home / Self Care | Payer: No Typology Code available for payment source | Source: Ambulatory Visit | Attending: Obstetrics and Gynecology | Admitting: Obstetrics and Gynecology

## 2018-03-24 ENCOUNTER — Encounter (HOSPITAL_COMMUNITY): Payer: Self-pay

## 2018-03-24 ENCOUNTER — Ambulatory Visit (HOSPITAL_COMMUNITY)
Admission: RE | Admit: 2018-03-24 | Discharge: 2018-03-24 | Disposition: A | Discharge status: Home / Self Care | Payer: Self-pay | Source: Ambulatory Visit | Attending: Obstetrics and Gynecology | Admitting: Obstetrics and Gynecology

## 2018-03-24 VITALS — BP 110/68 | Ht 61.5 in

## 2018-03-24 DIAGNOSIS — Z1231 Encounter for screening mammogram for malignant neoplasm of breast: Secondary | ICD-10-CM

## 2018-03-24 DIAGNOSIS — Z1239 Encounter for other screening for malignant neoplasm of breast: Secondary | ICD-10-CM

## 2018-03-24 NOTE — Progress Notes (Signed)
Patient referred to [facility] by the [facility] due to cervical mass.  Pap Smear: Pap smear not completed today. Last Pap smear was 09/02/2004 and normal. Per patient has no history of an abnormal Pap smear. Pap smear was attempted at the [facility] for [facility] [place] on 03/16/2018 and patient was unable to tolerate Pap smear due to vaginal atrophy. Referred patient to the [facility] for follow-up. A Pap smear and cervical biopsy are recommended under anesthesia. Last Pap smear result is in Epic.  Physical exam: Breasts Breasts symmetrical. No skin abnormalities bilateral breasts. No nipple retraction bilateral breasts. No nipple discharge bilateral breasts. No lymphadenopathy. No lumps palpated bilateral breasts. No complaints of pain or tenderness on exam. Referred patient to the [facility] for a screening mammogram. Appointment scheduled for Thursday, March 24, 2018 at 1020.        Pelvic/Bimanual No Pap smear completed today since Pap smear and cervical biopsy are recommended under anesthesia.  Smoking History: Patient has never smoked.  Patient Navigation: Patient education provided. Access to services provided for patient through [facility] program.   Colorectal Cancer Screening: Per patient has never had a colonoscopy completed. No complaints today. FIT Test given to patient to complete and return to [facility].  Breast and Cervical Cancer Risk Assessment: Patient has no family history of breast cancer, known genetic mutations, or radiation treatment to the chest before age 18. Patient has no history of cervical dysplasia, immunocompromised, or DES exposure in-utero.  Risk Assessment    Risk Scores      03/24/2018   Last edited by: Armond Hang, LPN   5-year risk: 1.8 %   Lifetime risk: 6.9 %

## 2018-03-24 NOTE — Patient Instructions (Signed)
Explained breast self awareness with Kasheena Sen. Pap smear was attempted at the [facility] on 03/16/2018 and patient was unable to tolerate Pap smear due to vaginal atrophy. Referred patient to the [facility] for follow-up. A Pap smear and cervical biopsy are recommended under anesthesia. Referred patient to the [facility] for a screening mammogram. Appointment scheduled for Thursday, March 24, 2018 at 1020. Let patient know the [facility] will follow up with her within the next couple weeks with results of mammogram by letter or phone. Kyiah Allemand verbalized understanding.  Winifred Bodiford, Arvil Chaco, RN 10:35 AM

## 2018-03-25 ENCOUNTER — Encounter: Payer: Self-pay | Admitting: *Deleted

## 2018-03-25 ENCOUNTER — Encounter (HOSPITAL_COMMUNITY): Payer: Self-pay | Admitting: *Deleted

## 2018-03-28 ENCOUNTER — Encounter (HOSPITAL_COMMUNITY): Payer: Self-pay | Admitting: *Deleted

## 2018-03-28 ENCOUNTER — Encounter (HOSPITAL_COMMUNITY): Payer: Self-pay

## 2018-03-28 ENCOUNTER — Other Ambulatory Visit: Payer: Self-pay

## 2018-04-04 ENCOUNTER — Other Ambulatory Visit: Payer: Self-pay | Admitting: Obstetrics and Gynecology

## 2018-04-12 ENCOUNTER — Ambulatory Visit (HOSPITAL_COMMUNITY): Payer: Medicaid Other | Admitting: Registered Nurse

## 2018-04-12 ENCOUNTER — Telehealth: Payer: Self-pay

## 2018-04-12 ENCOUNTER — Ambulatory Visit (HOSPITAL_COMMUNITY)
Admission: RE | Admit: 2018-04-12 | Discharge: 2018-04-12 | Disposition: A | Discharge status: Home / Self Care | Payer: Medicaid Other | Source: Ambulatory Visit | Attending: Obstetrics and Gynecology | Admitting: Obstetrics and Gynecology

## 2018-04-12 ENCOUNTER — Encounter (HOSPITAL_COMMUNITY): Payer: Self-pay | Admitting: Anesthesiology

## 2018-04-12 ENCOUNTER — Encounter (HOSPITAL_COMMUNITY)
Admission: RE | Disposition: A | Discharge status: Home / Self Care | Payer: Self-pay | Source: Ambulatory Visit | Attending: Obstetrics and Gynecology

## 2018-04-12 DIAGNOSIS — N888 Other specified noninflammatory disorders of cervix uteri: Secondary | ICD-10-CM | POA: Diagnosis present

## 2018-04-12 DIAGNOSIS — C539 Malignant neoplasm of cervix uteri, unspecified: Secondary | ICD-10-CM | POA: Diagnosis not present

## 2018-04-12 DIAGNOSIS — N950 Postmenopausal bleeding: Secondary | ICD-10-CM | POA: Diagnosis present

## 2018-04-12 DIAGNOSIS — M810 Age-related osteoporosis without current pathological fracture: Secondary | ICD-10-CM | POA: Diagnosis not present

## 2018-04-12 LAB — TYPE AND SCREEN
ABO/RH(D): O POS
Antibody Screen: NEGATIVE

## 2018-04-12 LAB — CBC
HCT: 27.8 % — ABNORMAL LOW (ref 36.0–46.0)
Hemoglobin: 8.8 g/dL — ABNORMAL LOW (ref 12.0–15.0)
MCH: 24.9 pg — AB (ref 26.0–34.0)
MCHC: 31.7 g/dL (ref 30.0–36.0)
MCV: 78.8 fL (ref 78.0–100.0)
PLATELETS: 311 10*3/uL (ref 150–400)
RBC: 3.53 MIL/uL — ABNORMAL LOW (ref 3.87–5.11)
RDW: 14.7 % (ref 11.5–15.5)
WBC: 8.1 10*3/uL (ref 4.0–10.5)

## 2018-04-12 LAB — ABO/RH: ABO/RH(D): O POS

## 2018-04-12 SURGERY — EXAM UNDER ANESTHESIA
Anesthesia: General | Site: Vagina

## 2018-04-12 MED ORDER — IBUPROFEN 600 MG PO TABS: 600.0000 mg | ORAL_TABLET | Freq: Four times a day (QID) | ORAL | 3 refills | Status: DC | PRN

## 2018-04-12 MED ORDER — FENTANYL CITRATE (PF) 100 MCG/2ML IJ SOLN
INTRAMUSCULAR | Status: DC | PRN
  Administered 2018-04-12 (×2): 50 ug via INTRAVENOUS

## 2018-04-12 MED ORDER — ONDANSETRON HCL 4 MG/2ML IJ SOLN
INTRAMUSCULAR | Status: DC | PRN
  Administered 2018-04-12: 4 mg via INTRAVENOUS

## 2018-04-12 MED ORDER — FENTANYL CITRATE (PF) 100 MCG/2ML IJ SOLN
INTRAMUSCULAR | Status: AC
  Filled 2018-04-12: qty 2

## 2018-04-12 MED ORDER — PROPOFOL 10 MG/ML IV BOLUS
INTRAVENOUS | Status: AC
  Filled 2018-04-12: qty 20

## 2018-04-12 MED ORDER — OXYCODONE-ACETAMINOPHEN 5-325 MG PO TABS: 1.0000 | ORAL_TABLET | Freq: Four times a day (QID) | ORAL | 0 refills | Status: DC | PRN

## 2018-04-12 MED ORDER — DEXAMETHASONE SODIUM PHOSPHATE 10 MG/ML IJ SOLN
INTRAMUSCULAR | Status: DC | PRN
  Administered 2018-04-12: 5 mg via INTRAVENOUS

## 2018-04-12 MED ORDER — PROPOFOL 10 MG/ML IV BOLUS
INTRAVENOUS | Status: DC | PRN
  Administered 2018-04-12: 100 mg via INTRAVENOUS

## 2018-04-12 MED ORDER — FERRIC SUBSULFATE 259 MG/GM EX SOLN
CUTANEOUS | Status: AC
  Filled 2018-04-12: qty 8

## 2018-04-12 MED ORDER — LACTATED RINGERS IV SOLN
INTRAVENOUS | Status: DC
  Administered 2018-04-12: 10:00:00 via INTRAVENOUS

## 2018-04-12 MED ORDER — LIDOCAINE 2% (20 MG/ML) 5 ML SYRINGE
INTRAMUSCULAR | Status: DC | PRN
  Administered 2018-04-12: 40 mg via INTRAVENOUS

## 2018-04-12 MED ORDER — FERRIC SUBSULFATE 259 MG/GM EX SOLN
CUTANEOUS | Status: DC | PRN
  Administered 2018-04-12: 1 via TOPICAL

## 2018-04-12 MED ORDER — ONDANSETRON HCL 4 MG/2ML IJ SOLN
INTRAMUSCULAR | Status: AC
  Filled 2018-04-12: qty 2

## 2018-04-12 MED ORDER — LIDOCAINE HCL (CARDIAC) PF 100 MG/5ML IV SOSY
PREFILLED_SYRINGE | INTRAVENOUS | Status: AC
  Filled 2018-04-12: qty 5

## 2018-04-12 MED ORDER — MIDAZOLAM HCL 2 MG/2ML IJ SOLN
INTRAMUSCULAR | Status: AC
  Filled 2018-04-12: qty 2

## 2018-04-12 MED ORDER — FERROUS SULFATE 325 (65 FE) MG PO TABS: 325.0000 mg | ORAL_TABLET | Freq: Two times a day (BID) | ORAL | 1 refills | Status: DC

## 2018-04-12 MED ORDER — MIDAZOLAM HCL 5 MG/5ML IJ SOLN
INTRAMUSCULAR | Status: DC | PRN
  Administered 2018-04-12 (×2): 1 mg via INTRAVENOUS

## 2018-04-12 MED ORDER — DOCUSATE SODIUM 100 MG PO CAPS: 100.0000 mg | ORAL_CAPSULE | Freq: Two times a day (BID) | ORAL | 2 refills | Status: DC

## 2018-04-12 MED ORDER — DEXAMETHASONE SODIUM PHOSPHATE 10 MG/ML IJ SOLN
INTRAMUSCULAR | Status: AC
  Filled 2018-04-12: qty 1

## 2018-04-12 SURGICAL SUPPLY — 8 items
COVER BACK TABLE 60X90IN (DRAPES) ×3 IMPLANT
GLOVE BIO SURGEON STRL SZ 6 (GLOVE) ×3 IMPLANT
GLOVE BIOGEL PI IND STRL 6.5 (GLOVE) ×2 IMPLANT
GLOVE BIOGEL PI INDICATOR 6.5 (GLOVE) ×4
GOWN STRL REUS W/TWL LRG LVL3 (GOWN DISPOSABLE) ×6 IMPLANT
PACK VAGINAL MINOR WOMEN LF (CUSTOM PROCEDURE TRAY) ×2 IMPLANT
SCOPETTES 8  STERILE (MISCELLANEOUS) ×4
SCOPETTES 8 STERILE (MISCELLANEOUS) IMPLANT

## 2018-04-12 NOTE — Anesthesia Procedure Notes (Signed)
Procedure Name: LMA Insertion Date/Time: 04/12/2018 10:42 AM Performed by: Talbot Grumbling, CRNA Pre-anesthesia Checklist: Patient identified, Emergency Drugs available, Suction available and Patient being monitored Patient Re-evaluated:Patient Re-evaluated prior to induction Oxygen Delivery Method: Circle system utilized Preoxygenation: Pre-oxygenation with 100% oxygen Induction Type: IV induction Ventilation: Mask ventilation without difficulty LMA: LMA inserted LMA Size: 3.0 Number of attempts: 1 Placement Confirmation: positive ETCO2 and breath sounds checked- equal and bilateral Tube secured with: Tape Dental Injury: Teeth and Oropharynx as per pre-operative assessment

## 2018-04-12 NOTE — Telephone Encounter (Signed)
-----   Message from [name], MD sent at 04/12/2018 11:22 AM EDT ----- Please refer this patient to [facility]. Pap smear and cervical mass biopsy done on 04/12/2018. She has a large cervical mass taking over the entire circumference of the vagina, very friable  Thanks  Peggy

## 2018-04-12 NOTE — Transfer of Care (Signed)
Immediate Anesthesia Transfer of Care Note  Patient: Erin Kent  Procedure(s) Performed: EXAM UNDER ANESTHESIA WITH CERVICAL MASS BIOPSY AND PAP SMEAR (N/A Vagina )  Patient Location: PACU  Anesthesia Type:General  Level of Consciousness: sedated  Airway & Oxygen Therapy: Patient Spontanous Breathing and Patient connected to nasal cannula oxygen  Post-op Assessment: Report given to RN and Post -op Vital signs reviewed and stable  Post vital signs: Reviewed and stable  Last Vitals:  Vitals Value Taken Time  BP 122/73 04/12/2018 11:15 AM  Temp    Pulse 72 04/12/2018 11:17 AM  Resp 9 04/12/2018 11:17 AM  SpO2 100 % 04/12/2018 11:17 AM  Vitals shown include unvalidated device data.  Last Pain:  Vitals:   04/12/18 1000  TempSrc: Oral      Patients Stated Pain Goal: 4 (18/33/58 2518)  Complications: No apparent anesthesia complications

## 2018-04-12 NOTE — Telephone Encounter (Signed)
Patient needs to be referred to gyn oncology. Referrals has been placed but office has not been called due to being close at this time.

## 2018-04-12 NOTE — Anesthesia Preprocedure Evaluation (Signed)
Anesthesia Evaluation  Patient identified by MRN, date of birth, ID band Patient awake    Reviewed: Allergy & Precautions, NPO status , Patient's Chart, lab work & pertinent test results  Airway Mallampati: I       Dental no notable dental hx. (+) Teeth Intact   Pulmonary neg pulmonary ROS,    Pulmonary exam normal breath sounds clear to auscultation       Cardiovascular negative cardio ROS Normal cardiovascular exam Rhythm:Regular Rate:Normal     Neuro/Psych negative neurological ROS  negative psych ROS   GI/Hepatic negative GI ROS, Neg liver ROS,   Endo/Other  negative endocrine ROS  Renal/GU negative Renal ROS  negative genitourinary   Musculoskeletal negative musculoskeletal ROS (+)   Abdominal Normal abdominal exam  (+)   Peds negative pediatric ROS (+)  Hematology negative hematology ROS (+)   Anesthesia Other Findings   Reproductive/Obstetrics negative OB ROS                             Anesthesia Physical Anesthesia Plan  ASA: I  Anesthesia Plan: General   Post-op Pain Management:    Induction: Intravenous  PONV Risk Score and Plan: 4 or greater and Ondansetron, Dexamethasone and Treatment may vary due to age or medical condition  Airway Management Planned: LMA  Additional Equipment:   Intra-op Plan:   Post-operative Plan: Extubation in OR  Informed Consent: I have reviewed the patients History and Physical, chart, labs and discussed the procedure including the risks, benefits and alternatives for the proposed anesthesia with the patient or authorized representative who has indicated his/her understanding and acceptance.   Dental advisory given  Plan Discussed with: CRNA and Surgeon  Anesthesia Plan Comments:         Anesthesia Quick Evaluation

## 2018-04-12 NOTE — H&P (Signed)
Erin Kent is an 67 y.o. female G0 postmenopausal who presented in the office for the evaluation of postmenopausal vaginal bleeding. Patient described the bleeding as heavy, soaking through her clothes. She was unable to tolerate a pelvic exam with speculum in the office. Bimanual exam raised concerns for the presence of a cervical mass. Patient here for exam under anesthesia. Patient admits that she has not had a gynecologic exam in years. Patient is currently without any complaints and denies any abdominal/pelvic pain  Pertinent Gynecological History: Menses: post-menopausal Bleeding: post menopausal bleeding Contraception: none DES exposure: denies Blood transfusions: none Sexually transmitted diseases: no past history Last mammogram: normal Date: 03/24/2018 Last pap: normal Date: 2005 OB History: G0   Menstrual History: No LMP recorded. Patient is postmenopausal.    Past Medical History:  Diagnosis Date  . Osteoporosis   . Postmenopausal bleeding     Past Surgical History:  Procedure Laterality Date  . WISDOM TOOTH EXTRACTION      Family History  Problem Relation Age of Onset  . Hypertension Mother     Social History:  reports that she has never smoked. She has never used smokeless tobacco. She reports that she does not drink alcohol or use drugs.  Allergies: No Known Allergies  Medications Prior to Admission  Medication Sig Dispense Refill Last Dose  . megestrol (MEGACE) 40 MG tablet Take 1 tablet (40 mg total) by mouth 3 (three) times daily for 5 days, THEN 1 tablet (40 mg total) 2 (two) times daily for 5 days, THEN 1 tablet (40 mg total) daily for 20 days. 45 tablet 0 04/11/2018 at Unknown time  . Multiple Vitamins-Minerals (CENTRUM SILVER ULTRA WOMENS PO) Take 1 tablet by mouth daily.   Past Week at Unknown time    ROS See pertinent in HPI Blood pressure 126/73, pulse 79, temperature (!) 97.5 F (36.4 C), temperature source Oral, resp. rate 16, height 5' 1.5"  (1.562 m), weight 90 lb (40.8 kg), SpO2 100 %. Physical Exam GENERAL: Well-developed, well-nourished female in no acute distress.  HEENT: Normocephalic, atraumatic. Sclerae anicteric.  NECK: Supple. Normal thyroid.  LUNGS: Clear to auscultation bilaterally.  HEART: Regular rate and rhythm. ABDOMEN: Soft, nontender, nondistended. No organomegaly. PELVIC: Deferred to OR EXTREMITIES: No cyanosis, clubbing, or edema, 2+ distal pulses.  Results for orders placed or performed during the hospital encounter of 04/12/18 (from the past 24 hour(s))  Type and screen     Status: None (Preliminary result)   Collection Time: 04/12/18  9:40 AM  Result Value Ref Range   ABO/RH(D) O POS    Antibody Screen PENDING    Sample Expiration      04/15/2018 Performed at [facility], [address] [place], [place] [address]   CBC     Status: Abnormal   Collection Time: 04/12/18  9:45 AM  Result Value Ref Range   WBC 8.1 4.0 - 10.5 K/uL   RBC 3.53 (L) 3.87 - 5.11 MIL/uL   Hemoglobin 8.8 (L) 12.0 - 15.0 g/dL   HCT 27.8 (L) 36.0 - 46.0 %   MCV 78.8 78.0 - 100.0 fL   MCH 24.9 (L) 26.0 - 34.0 pg   MCHC 31.7 30.0 - 36.0 g/dL   RDW 14.7 11.5 - 15.5 %   Platelets 311 150 - 400 K/uL    No results found. FINDINGS: Uterus  Measurements: 9.2 x 3.9 x 4.6 cm. No fibroids or other mass visualized.  Endometrium  The endometrium is not well visualized. On  transabdominal imaging, there is suggestion of mobile echogenic foci within the endometrial cavity, suggestive of gas.  Right ovary  Not visualized.  Left ovary  Not visualized.  Other findings  No abnormal free fluid.  IMPRESSION: There are mobile echogenic foci within the endometrial cavity suggestive of gas. This is nonspecific in etiology however may be secondary to endometritis. Correlate for history of recent instrumentation. This obscures visualization of the endometrial tissue and therefore a follow-up pelvic ultrasound  in 2-4 weeks is recommended after resolution of the acute symptomatology if symptoms persist to further evaluate the endometrium.   Electronically Signed   By: Lovey Newcomer M.D.   On: 03/13/2018 14:15  Assessment/Plan: 67 yo G0 with an episode of postmenopausal vaginal bleeding here for exam under anesthesia - Discussed plan for pap smear, possible cervical biospy and/or endometrial biopsy - Risks, benefits and alternatives were explained including but not limited to risks of bleeding, infection, uterine perforation and damage to adjacent organs. Patient verbalized understanding and all questions were answered  Erin Kent 04/12/2018, 10:24 AM

## 2018-04-12 NOTE — Op Note (Signed)
PREOPERATIVE DIAGNOSIS:  Postmenopausal vaginal bleeding. POSTOPERATIVE DIAGNOSIS: The same PROCEDURE: Exam under anesthesia, pap smear, cervical mass biopsy SURGEON:  Dr. [name]   INDICATIONS: 67 y.o. yo [id number] with PMB here for exam under anesthesia.Risks of surgery were discussed with the patient including but not limited to: bleeding which may require transfusion; infection which may require antibiotics; injury to uterus or surrounding organs; need for additional procedures including laparotomy or laparoscopy; and other postoperative/anesthesia complications. Written informed consent was obtained.    FINDINGS:  An 8-week size midline uterus.  No adnexal mass palpable on exam. Normal cervix not visualized. Friable mass seen in involving the vagina at the level of the cervix obliterating the anterior and posterior fornix.  ANESTHESIA:   General INTRAVENOUS FLUIDS:  300 ml of LR ESTIMATED BLOOD LOSS: 20 ml. SPECIMENS: pap smear, cervical mass biopsy COMPLICATIONS:  None immediate.  PROCEDURE DETAILS:  The patient was taken to the operating room where general anesthesia was administered and was found to be adequate.  After an adequate timeout was performed, she was placed in the dorsal lithotomy position and examined; then prepped and draped in the sterile manner.   A petterson speculum was then placed in the patient's vagina with the above findings. A pap smear was performed. Cervical mass biopsies obtained. Monsel was applied over the surface of the mass along with pressure to ensure hemostasis. The vaginal speculum was removed after noting good hemostasis.  The patient tolerated the procedure well and was taken to the recovery area awake, extubated and in stable condition.

## 2018-04-12 NOTE — Telephone Encounter (Signed)
Referral has been placed but appointment needs to be schedule.

## 2018-04-12 NOTE — Anesthesia Postprocedure Evaluation (Signed)
Anesthesia Post Note  Patient: Erin Kent  Procedure(s) Performed: EXAM UNDER ANESTHESIA WITH CERVICAL MASS BIOPSY AND PAP SMEAR (N/A Vagina )     Patient location during evaluation: PACU Anesthesia Type: General Level of consciousness: sedated and awake Pain management: pain level controlled Vital Signs Assessment: post-procedure vital signs reviewed and stable Respiratory status: spontaneous breathing Cardiovascular status: stable Postop Assessment: no apparent nausea or vomiting Anesthetic complications: no    Last Vitals:  Vitals:   04/12/18 1215 04/12/18 1230  BP: 127/65 119/63  Pulse: 81 65  Resp: 14 15  Temp:    SpO2: 100% 100%    Last Pain:  Vitals:   04/12/18 1145  TempSrc:   PainSc: Asleep   Pain Goal: Patients Stated Pain Goal: 4 (04/12/18 1000)               Rickard Kennerly JR,JOHN Mateo Flow

## 2018-04-12 NOTE — Discharge Instructions (Signed)
Cervical Biopsy, Care After Refer to this sheet in the next few weeks. These instructions provide you with information about caring for yourself after your procedure. Your health care provider may also give you more specific instructions. Your treatment has been planned according to current medical practices, but problems sometimes occur. Call your health care provider if you have any problems or questions after your procedure. What can I expect after the procedure? After the procedure, it is common to have:  Cramping or mild pain for a few days.  Slight bleeding from the vagina for a few days.  Dark-colored vaginal discharge for a few days.  Follow these instructions at home:  Take over-the-counter and prescription medicines only as told by your health care provider.  Return to your normal activities as told by your health care provider. Ask your health care provider what activities are safe for you.  Use a sanitary napkin until bleeding and discharge stop.  Do not use tampons until your health care provider approves.  Do not douche until your health care provider approves.  Do not have sex until your health care provider approves.  Keep all follow-up visits as told by your health care provider. This is important. Contact a health care provider if:  You have a fever or chills.  You have bad-smelling vaginal discharge.  You have itching or irritation around the vagina.  You have lower abdominal pain. Get help right away if:  You develop heavy vaginal bleeding that soaks more than one sanitary pad an hour.  You faint.  You have very bad lower abdominal pain. This information is not intended to replace advice given to you by your health care provider. Make sure you discuss any questions you have with your health care provider. Document Released: 05/22/2015 Document Revised: 02/06/2016 Document Reviewed: 01/16/2015 Elsevier Interactive Patient Education  2018 [place] [facility] Care Instructions  Activity: Get plenty of rest for the remainder of the day. A responsible individual must stay with you for 24 hours following the procedure.  For the next 24 hours, DO NOT: -Drive a car -Paediatric nurse -Drink alcoholic beverages -Take any medication unless instructed by your physician -Make any legal decisions or sign important papers.  Meals: Start with liquid foods such as gelatin or soup. Progress to regular foods as tolerated. Avoid greasy, spicy, heavy foods. If nausea and/or vomiting occur, drink only clear liquids until the nausea and/or vomiting subsides. Call your physician if vomiting continues.  Special Instructions/Symptoms: Your throat may feel dry or sore from the anesthesia or the breathing tube placed in your throat during surgery. If this causes discomfort, gargle with warm salt water. The discomfort should disappear within 24 hours.  If you had a scopolamine patch placed behind your ear for the management of post- operative nausea and/or vomiting:  1. The medication in the patch is effective for 72 hours, after which it should be removed.  Wrap patch in a tissue and discard in the trash. Wash hands thoroughly with soap and water. 2. You may remove the patch earlier than 72 hours if you experience unpleasant side effects which may include dry mouth, dizziness or visual disturbances. 3. Avoid touching the patch. Wash your hands with soap and water after contact with the patch.

## 2018-04-13 ENCOUNTER — Telehealth: Payer: Self-pay

## 2018-04-13 LAB — CYTOLOGY - PAP

## 2018-04-13 NOTE — Telephone Encounter (Signed)
Called Pt to advise of Biopsy,which shows she has Cervix Cancer. Advised pt that we are in the process of referring her to [facility] they will answer all question regarding Tx. Pt verbalized understanding.

## 2018-04-14 ENCOUNTER — Telehealth: Payer: Self-pay | Admitting: *Deleted

## 2018-04-14 ENCOUNTER — Telehealth: Payer: Self-pay

## 2018-04-14 NOTE — Telephone Encounter (Signed)
Called pt to advise of GYN Oncology appt on 04/18/18 at 11am & for her to arrive at 10:30am. No answer ,left VM & the centers contact# for questions. Also Gave name of provider that she will be seeing.

## 2018-04-14 NOTE — Telephone Encounter (Signed)
Patient and family called regarding her appt on 8/5. Patient and family stated that "She has not been able to have a pelvic exam without some type of anesthesia."  Explained to the patient/family I will notify the MD.

## 2018-04-18 ENCOUNTER — Inpatient Hospital Stay: Payer: Medicaid Other | Attending: Obstetrics | Admitting: Obstetrics

## 2018-04-18 ENCOUNTER — Encounter: Payer: Self-pay | Admitting: Oncology

## 2018-04-18 ENCOUNTER — Encounter: Payer: Self-pay | Admitting: Obstetrics

## 2018-04-18 VITALS — BP 127/76 | HR 76 | Temp 97.9°F | Resp 20 | Ht 61.5 in | Wt 88.8 lb

## 2018-04-18 DIAGNOSIS — C539 Malignant neoplasm of cervix uteri, unspecified: Secondary | ICD-10-CM | POA: Diagnosis present

## 2018-04-18 NOTE — Patient Instructions (Addendum)
Plan for an examination under anesthesia at [facility] with Dr. Gerarda Fraction on April 28, 2018.  You will have a PET scan in between that time.  If your insurance does not approve the PET scan, we may have to order a CT scan.  You will receive a phone call from the pre-surgical nurse to discuss the instructions.  Please call for any questions or concerns.  Follow up appointment with Dr. Gerarda Fraction on May 02, 2018.

## 2018-04-19 ENCOUNTER — Ambulatory Visit: Payer: No Typology Code available for payment source | Admitting: Obstetrics

## 2018-04-19 ENCOUNTER — Telehealth: Payer: Self-pay | Admitting: Oncology

## 2018-04-19 NOTE — Telephone Encounter (Signed)
Called Tomorrow and spoke to her friend Sales promotion account executive.  Advised her that Zareah's exam with Dr. Gerarda Fraction is being planned for 8/15 and that we will call her back with the exact time.  She verbalized agreement and said they will be dropping of a form today that Bralee needs signed for her work.

## 2018-04-20 NOTE — H&P (View-Only) (Signed)
[place] at [facility] Note: New Patient FIRST VISIT   Consult was requested by Dr. [name] for a cervical biopsy revealing squamous cell carcinoma.  Chief Complaint  Patient presents with  . Malignant neoplasm of cervix, unspecified site [facility])    HPI: [name]  is a nice 67 y.o. P0  The patient presented to Dr. Elly Modena with postmenopausal bleeding.  She is accompanied today by a church friend who she rents a room from.  The friend states the patient came to her in June stating she was bleeding and so appointments were made for follow-up at that time.  At one point she was passing large clots.  The patient has not had a gynecologic exam for many years.  It is difficult to get some of the history from the patient and so both the patient and her friend explained some of the course thus far to me.  The patient has a difficult time with office examination and so she was taken by Dr. Elly Modena for exam under anesthesia Pap smear and was found to have a cervical mass which was biopsied 04/12/2018.  Unfortunately the biopsy shows invasive squamous cell carcinoma and so the patient is referred for recommendations and continued management.  The patient's main complaints relate to the bleeding episode.  She denies pain.  In addition to the biopsy and ultrasound was obtained although the patient states it was quite uncomfortable this revealed a normal uterus although on transabdominal question of echogenic foci within the endometrial cavity it was difficult to complete the exam given the patient's discomfort.  Imported EPIC Oncologic History:   No history exists.    ECOG PERFORMANCE STATUS: 1 - Symptomatic but completely ambulatory  Measurement of disease: .   Radiology: . No results found.  . 03/13/18 TA/TVUS - Uterus Measurements: 9.2 x 3.9 x 4.6 cm. No fibroids or other mass visualized. Endometrium The endometrium is not well  visualized. On transabdominal imaging, there is suggestion of mobile echogenic foci within the endometrial cavity, suggestive of gas. Right ovary Not visualized. Left ovary Not visualized. Other findings No abnormal free fluid. IMPRESSION: There are mobile echogenic foci within the endometrial cavity suggestive of gas. This is nonspecific in etiology however may be secondary to endometritis. Correlate for history of recent instrumentation. This obscures visualization of the endometrial tissue and therefore a follow-up pelvic ultrasound in 2-4 weeks is recommended after resolution of the acute symptomatology if symptoms persist to further evaluate the endometrium.  Outpatient Encounter Medications as of 04/18/2018  Medication Sig  . acetaminophen (TYLENOL) 325 MG tablet Take 650 mg by mouth every 6 (six) hours as needed.  Marland Kitchen ibuprofen (ADVIL,MOTRIN) 600 MG tablet Take 1 tablet (600 mg total) by mouth every 6 (six) hours as needed.  . Multiple Vitamins-Minerals (CENTRUM SILVER ULTRA WOMENS PO) Take 1 tablet by mouth daily.  . ferrous sulfate (FERROUSUL) 325 (65 FE) MG tablet Take 1 tablet (325 mg total) by mouth 2 (two) times daily.  Marland Kitchen oxyCODONE-acetaminophen (PERCOCET/ROXICET) 5-325 MG tablet Take 1-2 tablets by mouth every 6 (six) hours as needed.  . [DISCONTINUED] docusate sodium (COLACE) 100 MG capsule Take 1 capsule (100 mg total) by mouth 2 (two) times daily.   No facility-administered encounter medications on file as of 04/18/2018.    No Known Allergies  Past Medical History:  Diagnosis Date  . Cervical cancer ([place])   . Iron deficiency anemia   . Osteoporosis  Patient states that she does not have this condition   Past Surgical History:  Procedure Laterality Date  . EUA/ PAPSMEAR/  CERVICAL MASS BIOPSY  04-12-2018   dr peggy constant @[facility]   . WISDOM TOOTH EXTRACTION          Past Gynecological History:   GYNECOLOGIC HISTORY:  No LMP recorded. Patient is postmenopausal. . Menarche: 67  years old . P 0 . LMP 89 is old . Contraceptive none . HRT none  . Last Pap many years ago Family Hx:  Family History  Problem Relation Age of Onset  . Hypertension Mother    Social Hx:  Marland Kitchen Tobacco use: Never . Alcohol use: None . Illicit Drug use: None . Illicit IV Drug use: None    Review of Systems: Review of Systems  Genitourinary: Positive for vaginal bleeding.   All other systems reviewed and are negative.  Vitals: Blood pressure 127/76, pulse 76, temperature 97.9 F (36.6 C), temperature source Oral, resp. rate 20, height 5' 1.5" (1.562 m), weight 88 lb 12.8 oz (40.3 kg), SpO2 100 %. Vitals:   04/18/18 1209  Weight: 88 lb 12.8 oz (40.3 kg)  Height: 5' 1.5" (1.562 m)    Body mass index is 16.51 kg/m.   Physical Exam: General :  Thin well developed, 67 y.o., female in no apparent distress HEENT:  Normocephalic/atraumatic, symmetric, EOMI, eyelids normal Neck:   Supple, no masses.  Lymphatics:  No cervical/ submandibular/ supraclavicular/ infraclavicular/ inguinal adenopathy Respiratory:  Respirations unlabored, no use of accessory muscles CV:   Deferred Breast:  Deferred Musculoskeletal: No CVA tenderness, normal muscle strength. Abdomen:   Soft, non-tender and nondistended. No evidence of hernia. No masses. Extremities:  No lymphedema, no erythema, non-tender. Skin:   Normal inspection Neuro/Psych:  No focal motor deficit, no abnormal mental status. Normal gait. Normal affect. Alert and oriented to person, place, and time  Genito Urinary: Vulva: Normal external female genitalia.  Bladder/urethra: Urethral meatus normal in size and location. No lesions or   masses, well supported bladder Refused speculum exam and bimanual exam:  Rectovaginal:  Good tone, no cul de sac nodularity.  Palpable mass of the presumed cervix 5 cm with questionable left parametrial involvement   Assessment  Suspect stage 2B SCCa of the cervix  Plan  1. Data reviewed ? I reviewed  the radiology reports and noted we will need additional imaging ? I placed an order for a CT and PET scan ? I reviewed her referring doctor's office notes and I have summarized in the HPI ? History was obtained from both the patient and her accompanying friend due to some difficulty with recall on the patient's part ? We reviewed the worrisome pathology report and my exam findings 2. Management o We discussed in detail management with surgery and cases in which that would be suitable versus management with chemoradiation and briefly the expected course of treatment should we recommend this path o My suspicion is there is parametrial involvement and patient will likely be delegated to upfront chemoradiation o However I am unable to get an adequate exam in the office today due to the patient's discomfort o Therefore I counseled the patient and her friend that I think the best next course of action besides imaging will be an exam under anesthesia o Ideally I would like to do the exam under anesthesia with our radiation oncologist however per my office on the day we have the patient scheduled the radiation oncologist is on vacation but  we will share with him our findings. 3. In the meantime of ordered imaging with both CT and PET scan hopefully we get both in order to expedite her management following the exam under anesthesia 4. She is to report any heavy vaginal bleeding or hemorrhage to [place] there is always a chance she could require emergent treatment 5. Return to clinic following exam under anesthesia and imaging studies for further treatment planning much of this may be done outside of her office visit and already be set up by the time she returns to see me  Face to face time with patient was 60 minutes. Over 50% of this time was spent on counseling and coordination of care.  Isabel Caprice, MD  04/23/2018, 1:53 PM    Cc: [name], MD (Referring Ob/Gyn) Patient, [name] (PCP)

## 2018-04-20 NOTE — Progress Notes (Signed)
[place] at [facility] Note: New Patient FIRST VISIT   Consult was requested by Dr. [name] for a cervical biopsy revealing squamous cell carcinoma.  Chief Complaint  Patient presents with  . Malignant neoplasm of cervix, unspecified site [facility])    HPI: [name]  is a nice 67 y.o. P0  The patient presented to Dr. Elly Modena with postmenopausal bleeding.  She is accompanied today by a church friend who she rents a room from.  The friend states the patient came to her in June stating she was bleeding and so appointments were made for follow-up at that time.  At one point she was passing large clots.  The patient has not had a gynecologic exam for many years.  It is difficult to get some of the history from the patient and so both the patient and her friend explained some of the course thus far to me.  The patient has a difficult time with office examination and so she was taken by Dr. Elly Modena for exam under anesthesia Pap smear and was found to have a cervical mass which was biopsied 04/12/2018.  Unfortunately the biopsy shows invasive squamous cell carcinoma and so the patient is referred for recommendations and continued management.  The patient's main complaints relate to the bleeding episode.  She denies pain.  In addition to the biopsy and ultrasound was obtained although the patient states it was quite uncomfortable this revealed a normal uterus although on transabdominal question of echogenic foci within the endometrial cavity it was difficult to complete the exam given the patient's discomfort.  Imported EPIC Oncologic History:   No history exists.    ECOG PERFORMANCE STATUS: 1 - Symptomatic but completely ambulatory  Measurement of disease: .   Radiology: . No results found.  . 03/13/18 TA/TVUS - Uterus Measurements: 9.2 x 3.9 x 4.6 cm. No fibroids or other mass visualized. Endometrium The endometrium is not well  visualized. On transabdominal imaging, there is suggestion of mobile echogenic foci within the endometrial cavity, suggestive of gas. Right ovary Not visualized. Left ovary Not visualized. Other findings No abnormal free fluid. IMPRESSION: There are mobile echogenic foci within the endometrial cavity suggestive of gas. This is nonspecific in etiology however may be secondary to endometritis. Correlate for history of recent instrumentation. This obscures visualization of the endometrial tissue and therefore a follow-up pelvic ultrasound in 2-4 weeks is recommended after resolution of the acute symptomatology if symptoms persist to further evaluate the endometrium.  Outpatient Encounter Medications as of 04/18/2018  Medication Sig  . acetaminophen (TYLENOL) 325 MG tablet Take 650 mg by mouth every 6 (six) hours as needed.  Marland Kitchen ibuprofen (ADVIL,MOTRIN) 600 MG tablet Take 1 tablet (600 mg total) by mouth every 6 (six) hours as needed.  . Multiple Vitamins-Minerals (CENTRUM SILVER ULTRA WOMENS PO) Take 1 tablet by mouth daily.  . ferrous sulfate (FERROUSUL) 325 (65 FE) MG tablet Take 1 tablet (325 mg total) by mouth 2 (two) times daily.  Marland Kitchen oxyCODONE-acetaminophen (PERCOCET/ROXICET) 5-325 MG tablet Take 1-2 tablets by mouth every 6 (six) hours as needed.  . [DISCONTINUED] docusate sodium (COLACE) 100 MG capsule Take 1 capsule (100 mg total) by mouth 2 (two) times daily.   No facility-administered encounter medications on file as of 04/18/2018.    No Known Allergies  Past Medical History:  Diagnosis Date  . Cervical cancer ([place])   . Iron deficiency anemia   . Osteoporosis  Patient states that she does not have this condition   Past Surgical History:  Procedure Laterality Date  . EUA/ PAPSMEAR/  CERVICAL MASS BIOPSY  04-12-2018   dr peggy constant @[facility]   . WISDOM TOOTH EXTRACTION          Past Gynecological History:   GYNECOLOGIC HISTORY:  No LMP recorded. Patient is postmenopausal. . Menarche: 67  years old . P 0 . LMP 11 is old . Contraceptive none . HRT none  . Last Pap many years ago Family Hx:  Family History  Problem Relation Age of Onset  . Hypertension Mother    Social Hx:  Marland Kitchen Tobacco use: Never . Alcohol use: None . Illicit Drug use: None . Illicit IV Drug use: None    Review of Systems: Review of Systems  Genitourinary: Positive for vaginal bleeding.   All other systems reviewed and are negative.  Vitals: Blood pressure 127/76, pulse 76, temperature 97.9 F (36.6 C), temperature source Oral, resp. rate 20, height 5' 1.5" (1.562 m), weight 88 lb 12.8 oz (40.3 kg), SpO2 100 %. Vitals:   04/18/18 1209  Weight: 88 lb 12.8 oz (40.3 kg)  Height: 5' 1.5" (1.562 m)    Body mass index is 16.51 kg/m.   Physical Exam: General :  Thin well developed, 67 y.o., female in no apparent distress HEENT:  Normocephalic/atraumatic, symmetric, EOMI, eyelids normal Neck:   Supple, no masses.  Lymphatics:  No cervical/ submandibular/ supraclavicular/ infraclavicular/ inguinal adenopathy Respiratory:  Respirations unlabored, no use of accessory muscles CV:   Deferred Breast:  Deferred Musculoskeletal: No CVA tenderness, normal muscle strength. Abdomen:   Soft, non-tender and nondistended. No evidence of hernia. No masses. Extremities:  No lymphedema, no erythema, non-tender. Skin:   Normal inspection Neuro/Psych:  No focal motor deficit, no abnormal mental status. Normal gait. Normal affect. Alert and oriented to person, place, and time  Genito Urinary: Vulva: Normal external female genitalia.  Bladder/urethra: Urethral meatus normal in size and location. No lesions or   masses, well supported bladder Refused speculum exam and bimanual exam:  Rectovaginal:  Good tone, no cul de sac nodularity.  Palpable mass of the presumed cervix 5 cm with questionable left parametrial involvement   Assessment  Suspect stage 2B SCCa of the cervix  Plan  1. Data reviewed ? I reviewed  the radiology reports and noted we will need additional imaging ? I placed an order for a CT and PET scan ? I reviewed her referring doctor's office notes and I have summarized in the HPI ? History was obtained from both the patient and her accompanying friend due to some difficulty with recall on the patient's part ? We reviewed the worrisome pathology report and my exam findings 2. Management o We discussed in detail management with surgery and cases in which that would be suitable versus management with chemoradiation and briefly the expected course of treatment should we recommend this path o My suspicion is there is parametrial involvement and patient will likely be delegated to upfront chemoradiation o However I am unable to get an adequate exam in the office today due to the patient's discomfort o Therefore I counseled the patient and her friend that I think the best next course of action besides imaging will be an exam under anesthesia o Ideally I would like to do the exam under anesthesia with our radiation oncologist however per my office on the day we have the patient scheduled the radiation oncologist is on vacation but  we will share with him our findings. 3. In the meantime of ordered imaging with both CT and PET scan hopefully we get both in order to expedite her management following the exam under anesthesia 4. She is to report any heavy vaginal bleeding or hemorrhage to [place] there is always a chance she could require emergent treatment 5. Return to clinic following exam under anesthesia and imaging studies for further treatment planning much of this may be done outside of her office visit and already be set up by the time she returns to see me  Face to face time with patient was 60 minutes. Over 50% of this time was spent on counseling and coordination of care.  Isabel Caprice, MD  04/23/2018, 1:53 PM    Cc: [name], MD (Referring Ob/Gyn) Patient, [name] (PCP)

## 2018-04-21 ENCOUNTER — Telehealth: Payer: Self-pay | Admitting: *Deleted

## 2018-04-21 ENCOUNTER — Other Ambulatory Visit: Payer: Self-pay

## 2018-04-21 ENCOUNTER — Encounter (HOSPITAL_BASED_OUTPATIENT_CLINIC_OR_DEPARTMENT_OTHER): Payer: Self-pay | Admitting: *Deleted

## 2018-04-21 NOTE — Progress Notes (Addendum)
Spoke w/ pt and her roommate, fannie bennett (whom does all the paper work), via phone for pre-op interview.  Npo after mn.  Arrive at 0530.  CBC result dated 04-12-2018 w/ chart and in epic.

## 2018-04-21 NOTE — Telephone Encounter (Signed)
Erin Kent from [facility] office called needing addition information for the duration/frequency. Erin Kent APP fill out the paper work and faxed to the ofice. Several attempted made to contact Erin Kent at the office at [phone].

## 2018-04-23 ENCOUNTER — Encounter: Payer: Self-pay | Admitting: Obstetrics

## 2018-04-23 DIAGNOSIS — C539 Malignant neoplasm of cervix uteri, unspecified: Secondary | ICD-10-CM | POA: Insufficient documentation

## 2018-04-25 ENCOUNTER — Ambulatory Visit (HOSPITAL_COMMUNITY)
Admission: RE | Admit: 2018-04-25 | Discharge: 2018-04-25 | Disposition: A | Discharge status: Home / Self Care | Payer: Medicaid Other | Source: Ambulatory Visit | Attending: Obstetrics | Admitting: Obstetrics

## 2018-04-25 DIAGNOSIS — R599 Enlarged lymph nodes, unspecified: Secondary | ICD-10-CM | POA: Diagnosis not present

## 2018-04-25 DIAGNOSIS — C539 Malignant neoplasm of cervix uteri, unspecified: Secondary | ICD-10-CM | POA: Insufficient documentation

## 2018-04-25 DIAGNOSIS — I700 Atherosclerosis of aorta: Secondary | ICD-10-CM | POA: Insufficient documentation

## 2018-04-25 LAB — GLUCOSE, CAPILLARY: Glucose-Capillary: 93 mg/dL (ref 70–99)

## 2018-04-25 MED ORDER — FLUDEOXYGLUCOSE F - 18 (FDG) INJECTION
5.0200 | Freq: Once | INTRAVENOUS | Status: AC | PRN
  Administered 2018-04-25: 5.02 via INTRAVENOUS

## 2018-04-27 ENCOUNTER — Encounter: Payer: Self-pay | Admitting: Oncology

## 2018-04-27 ENCOUNTER — Other Ambulatory Visit: Payer: Self-pay | Admitting: Obstetrics

## 2018-04-27 DIAGNOSIS — C539 Malignant neoplasm of cervix uteri, unspecified: Secondary | ICD-10-CM

## 2018-04-28 ENCOUNTER — Ambulatory Visit (HOSPITAL_BASED_OUTPATIENT_CLINIC_OR_DEPARTMENT_OTHER): Payer: Medicaid Other | Admitting: Anesthesiology

## 2018-04-28 ENCOUNTER — Encounter (HOSPITAL_BASED_OUTPATIENT_CLINIC_OR_DEPARTMENT_OTHER): Payer: Self-pay | Admitting: *Deleted

## 2018-04-28 ENCOUNTER — Ambulatory Visit (HOSPITAL_BASED_OUTPATIENT_CLINIC_OR_DEPARTMENT_OTHER)
Admission: RE | Admit: 2018-04-28 | Discharge: 2018-04-28 | Disposition: A | Discharge status: Home / Self Care | Payer: Medicaid Other | Source: Ambulatory Visit | Attending: Obstetrics | Admitting: Obstetrics

## 2018-04-28 ENCOUNTER — Telehealth: Payer: Self-pay | Admitting: Oncology

## 2018-04-28 ENCOUNTER — Encounter: Payer: Self-pay | Admitting: Radiation Oncology

## 2018-04-28 ENCOUNTER — Encounter (HOSPITAL_BASED_OUTPATIENT_CLINIC_OR_DEPARTMENT_OTHER)
Admission: RE | Disposition: A | Discharge status: Home / Self Care | Payer: Self-pay | Source: Ambulatory Visit | Attending: Obstetrics

## 2018-04-28 DIAGNOSIS — C538 Malignant neoplasm of overlapping sites of cervix uteri: Secondary | ICD-10-CM | POA: Diagnosis not present

## 2018-04-28 DIAGNOSIS — Z8249 Family history of ischemic heart disease and other diseases of the circulatory system: Secondary | ICD-10-CM | POA: Diagnosis not present

## 2018-04-28 DIAGNOSIS — Z79891 Long term (current) use of opiate analgesic: Secondary | ICD-10-CM | POA: Diagnosis not present

## 2018-04-28 DIAGNOSIS — C539 Malignant neoplasm of cervix uteri, unspecified: Secondary | ICD-10-CM | POA: Insufficient documentation

## 2018-04-28 DIAGNOSIS — D509 Iron deficiency anemia, unspecified: Secondary | ICD-10-CM | POA: Insufficient documentation

## 2018-04-28 DIAGNOSIS — D4959 Neoplasm of unspecified behavior of other genitourinary organ: Secondary | ICD-10-CM | POA: Diagnosis present

## 2018-04-28 HISTORY — PX: CYSTOSCOPY: SHX5120

## 2018-04-28 HISTORY — DX: Iron deficiency anemia, unspecified: D50.9

## 2018-04-28 SURGERY — EXAM UNDER ANESTHESIA
Anesthesia: General | Site: Vagina

## 2018-04-28 MED ORDER — MIDAZOLAM HCL 2 MG/2ML IJ SOLN
INTRAMUSCULAR | Status: AC
  Filled 2018-04-28: qty 2

## 2018-04-28 MED ORDER — HYDROMORPHONE HCL 1 MG/ML IJ SOLN
INTRAMUSCULAR | Status: AC
  Filled 2018-04-28: qty 1

## 2018-04-28 MED ORDER — IBUPROFEN 200 MG PO TABS
200.0000 mg | ORAL_TABLET | Freq: Four times a day (QID) | ORAL | Status: DC | PRN
  Filled 2018-04-28: qty 2

## 2018-04-28 MED ORDER — DEXAMETHASONE SODIUM PHOSPHATE 10 MG/ML IJ SOLN
INTRAMUSCULAR | Status: DC | PRN
  Administered 2018-04-28: 10 mg via INTRAVENOUS

## 2018-04-28 MED ORDER — IBUPROFEN 100 MG/5ML PO SUSP
200.0000 mg | Freq: Four times a day (QID) | ORAL | Status: DC | PRN
  Filled 2018-04-28: qty 30

## 2018-04-28 MED ORDER — PROMETHAZINE HCL 25 MG/ML IJ SOLN
6.2500 mg | INTRAMUSCULAR | Status: DC | PRN
  Filled 2018-04-28: qty 1

## 2018-04-28 MED ORDER — FENTANYL CITRATE (PF) 100 MCG/2ML IJ SOLN
INTRAMUSCULAR | Status: DC | PRN
  Administered 2018-04-28: 50 ug via INTRAVENOUS

## 2018-04-28 MED ORDER — HYDROMORPHONE HCL 1 MG/ML IJ SOLN
0.2500 mg | INTRAMUSCULAR | Status: DC | PRN
  Administered 2018-04-28 (×2): 0.5 mg via INTRAVENOUS
  Filled 2018-04-28: qty 0.5

## 2018-04-28 MED ORDER — PROPOFOL 10 MG/ML IV BOLUS
INTRAVENOUS | Status: DC | PRN
  Administered 2018-04-28: 80 mg via INTRAVENOUS

## 2018-04-28 MED ORDER — MEPERIDINE HCL 25 MG/ML IJ SOLN
6.2500 mg | INTRAMUSCULAR | Status: DC | PRN
  Filled 2018-04-28: qty 1

## 2018-04-28 MED ORDER — EPHEDRINE SULFATE-NACL 50-0.9 MG/10ML-% IV SOSY
PREFILLED_SYRINGE | INTRAVENOUS | Status: DC | PRN
  Administered 2018-04-28: 10 mg via INTRAVENOUS

## 2018-04-28 MED ORDER — OXYCODONE HCL 5 MG PO TABS
5.0000 mg | ORAL_TABLET | Freq: Once | ORAL | Status: DC | PRN
  Filled 2018-04-28: qty 1

## 2018-04-28 MED ORDER — WHITE PETROLATUM EX OINT
TOPICAL_OINTMENT | CUTANEOUS | Status: AC
  Filled 2018-04-28: qty 5

## 2018-04-28 MED ORDER — FENTANYL CITRATE (PF) 100 MCG/2ML IJ SOLN
INTRAMUSCULAR | Status: AC
Start: 2018-04-28 — End: ?
  Filled 2018-04-28: qty 2

## 2018-04-28 MED ORDER — FERRIC SUBSULFATE 259 MG/GM EX SOLN
CUTANEOUS | Status: DC | PRN
  Administered 2018-04-28: 1

## 2018-04-28 MED ORDER — LIDOCAINE 2% (20 MG/ML) 5 ML SYRINGE
INTRAMUSCULAR | Status: DC | PRN
  Administered 2018-04-28: 40 mg via INTRAVENOUS

## 2018-04-28 MED ORDER — OXYCODONE HCL 5 MG/5ML PO SOLN
5.0000 mg | Freq: Once | ORAL | Status: DC | PRN
  Filled 2018-04-28: qty 5

## 2018-04-28 MED ORDER — ONDANSETRON HCL 4 MG/2ML IJ SOLN
INTRAMUSCULAR | Status: DC | PRN
  Administered 2018-04-28: 4 mg via INTRAVENOUS

## 2018-04-28 MED ORDER — PROPOFOL 10 MG/ML IV BOLUS
INTRAVENOUS | Status: AC
  Filled 2018-04-28: qty 20

## 2018-04-28 MED ORDER — EPHEDRINE 5 MG/ML INJ
INTRAVENOUS | Status: AC
  Filled 2018-04-28: qty 10

## 2018-04-28 MED ORDER — LACTATED RINGERS IV SOLN
INTRAVENOUS | Status: DC
  Administered 2018-04-28: 1000 mL via INTRAVENOUS
  Filled 2018-04-28: qty 1000

## 2018-04-28 MED ORDER — FENTANYL CITRATE (PF) 100 MCG/2ML IJ SOLN
25.0000 ug | INTRAMUSCULAR | Status: DC | PRN
  Filled 2018-04-28: qty 1

## 2018-04-28 MED ORDER — MIDAZOLAM HCL 2 MG/2ML IJ SOLN
INTRAMUSCULAR | Status: DC | PRN
  Administered 2018-04-28: 1 mg via INTRAVENOUS

## 2018-04-28 MED ORDER — ONDANSETRON HCL 4 MG/2ML IJ SOLN
INTRAMUSCULAR | Status: AC
  Filled 2018-04-28: qty 2

## 2018-04-28 MED ORDER — DEXAMETHASONE SODIUM PHOSPHATE 10 MG/ML IJ SOLN
INTRAMUSCULAR | Status: AC
  Filled 2018-04-28: qty 1

## 2018-04-28 MED ORDER — BUPIVACAINE HCL 0.25 % IJ SOLN
INTRAMUSCULAR | Status: DC | PRN
  Administered 2018-04-28: 1 mL

## 2018-04-28 MED ORDER — ONDANSETRON HCL 4 MG/2ML IJ SOLN
4.0000 mg | Freq: Once | INTRAMUSCULAR | Status: DC | PRN
  Filled 2018-04-28: qty 2

## 2018-04-28 SURGICAL SUPPLY — 32 items
APL SWBSTK 6 STRL LF DISP (MISCELLANEOUS) ×2
APPLICATOR COTTON TIP 6 STRL (MISCELLANEOUS) IMPLANT
APPLICATOR COTTON TIP 6IN STRL (MISCELLANEOUS) ×4
BLADE SURG 11 STRL SS (BLADE) IMPLANT
BLADE SURG 15 STRL LF DISP TIS (BLADE) IMPLANT
BLADE SURG 15 STRL SS (BLADE)
CANISTER SUCT 3000ML PPV (MISCELLANEOUS) ×4 IMPLANT
CATH ROBINSON RED A/P 14FR (CATHETERS) ×2 IMPLANT
GLOVE BIO SURGEON STRL SZ 6 (GLOVE) ×8 IMPLANT
GOWN STRL REUS W/TWL LRG LVL3 (GOWN DISPOSABLE) ×4 IMPLANT
KIT TURNOVER CYSTO (KITS) ×4 IMPLANT
LEGGING LITHOTOMY PAIR STRL (DRAPES) IMPLANT
NDL HYPO 25X1 1.5 SAFETY (NEEDLE) IMPLANT
NEEDLE HYPO 25X1 1.5 SAFETY (NEEDLE) IMPLANT
NS IRRIG 500ML POUR BTL (IV SOLUTION) ×4 IMPLANT
PACK VAGINAL WOMENS (CUSTOM PROCEDURE TRAY) ×4 IMPLANT
PAD OB MATERNITY 4.3X12.25 (PERSONAL CARE ITEMS) ×4 IMPLANT
SCOPETTES 8  STERILE (MISCELLANEOUS) ×2
SCOPETTES 8 STERILE (MISCELLANEOUS) IMPLANT
SET IRRIG Y TYPE TUR BLADDER L (SET/KITS/TRAYS/PACK) ×2 IMPLANT
SUT VIC AB 2-0 CT2 27 (SUTURE) IMPLANT
SUT VIC AB 2-0 SH 27 (SUTURE)
SUT VIC AB 2-0 SH 27XBRD (SUTURE) IMPLANT
SUT VIC AB 3-0 PS2 18 (SUTURE)
SUT VIC AB 3-0 PS2 18XBRD (SUTURE) IMPLANT
SUT VICRYL 2 0 18  UND BR (SUTURE)
SUT VICRYL 2 0 18 UND BR (SUTURE) IMPLANT
SWAB OB GYN 8IN STERILE 2PK (MISCELLANEOUS) IMPLANT
SYR BULB IRRIGATION 50ML (SYRINGE) ×4 IMPLANT
TOWEL OR 17X24 6PK STRL BLUE (TOWEL DISPOSABLE) ×6 IMPLANT
UNDERPAD 30X30 (UNDERPADS AND DIAPERS) ×4 IMPLANT
WATER STERILE IRR 500ML POUR (IV SOLUTION) ×4 IMPLANT

## 2018-04-28 NOTE — Transfer of Care (Signed)
Immediate Anesthesia Transfer of Care Note  Patient: Erin Kent  Procedure(s) Performed: Procedure(s) (LRB): EXAM UNDER ANESTHESIA (N/A) CYSTOSCOPY (N/A)  Patient Location: PACU  Anesthesia Type: General  Level of Consciousness: awake, oriented, sedated and patient cooperative  Airway & Oxygen Therapy: Patient Spontanous Breathing and Patient connected to face mask oxygen  Post-op Assessment: Report given to PACU RN and Post -op Vital signs reviewed and stable  Post vital signs: Reviewed and stable  Complications: No apparent anesthesia complications  Last Vitals:  Vitals Value Taken Time  BP 120/65 04/28/2018  8:23 AM  Temp    Pulse 73 04/28/2018  8:25 AM  Resp 17 04/28/2018  8:25 AM  SpO2 100 % 04/28/2018  8:25 AM  Vitals shown include unvalidated device data.  Last Pain:  Vitals:   04/28/18 0529  TempSrc: Oral

## 2018-04-28 NOTE — Anesthesia Postprocedure Evaluation (Signed)
Anesthesia Post Note  Patient: Santiago Graf Mas  Procedure(s) Performed: EXAM UNDER ANESTHESIA (N/A Vagina ) CYSTOSCOPY (N/A Bladder)     Patient location during evaluation: PACU Anesthesia Type: General Level of consciousness: awake and alert Pain management: pain level controlled Vital Signs Assessment: post-procedure vital signs reviewed and stable Respiratory status: spontaneous breathing, nonlabored ventilation and respiratory function stable Cardiovascular status: blood pressure returned to baseline and stable Postop Assessment: no apparent nausea or vomiting Anesthetic complications: no    Last Vitals:  Vitals:   04/28/18 0845 04/28/18 0900  BP: 117/65 121/61  Pulse: 71 71  Resp: 18 17  Temp:    SpO2: 100% 100%    Last Pain:  Vitals:   04/28/18 0900  TempSrc:   PainSc: Asleep                 Lynda Rainwater

## 2018-04-28 NOTE — Discharge Instructions (Signed)
°  Post Anesthesia Home Care Instructions  Activity: Get plenty of rest for the remainder of the day. A responsible individual must stay with you for 24 hours following the procedure.  For the next 24 hours, DO NOT: -Drive a car -Paediatric nurse -Drink alcoholic beverages -Take any medication unless instructed by your physician -Make any legal decisions or sign important papers.  Meals: Start with liquid foods such as gelatin or soup. Progress to regular foods as tolerated. Avoid greasy, spicy, heavy foods. If nausea and/or vomiting occur, drink only clear liquids until the nausea and/or vomiting subsides. Call your physician if vomiting continues.  Special Instructions/Symptoms: Your throat may feel dry or sore from the anesthesia or the breathing tube placed in your throat during surgery. If this causes discomfort, gargle with warm salt water. The discomfort should disappear within 24 hours.  If you had a scopolamine patch placed behind your ear for the management of post- operative nausea and/or vomiting:  1. The medication in the patch is effective for 72 hours, after which it should be removed.  Wrap patch in a tissue and discard in the trash. Wash hands thoroughly with soap and water. 2. You may remove the patch earlier than 72 hours if you experience unpleasant side effects which may include dry mouth, dizziness or visual disturbances. 3. Avoid touching the patch. Wash your hands with soap and water after contact with the patch.    The following instructions have been prepared to help you care for yourself upon your return home.   Personal hygiene:  Use sanitary pads for vaginal drainage, not tampons.  Shower the day after your procedure.  NO tub baths, pools or Jacuzzis for 2-3 weeks.  Wipe front to back after using the bathroom.  Activity and limitations:  Do NOT drive or operate any equipment for 24 hours. The effects of anesthesia are still present and drowsiness may  result.  Do NOT rest in bed all day.  Walking is encouraged.  Walk up and down stairs slowly.  You may resume your normal activity in one to two days or as indicated by your physician.  Sexual activity: NO intercourse for at least 2 weeks after the procedure, or as indicated by your physician.  Diet: Eat a light meal as desired this evening. You may resume your usual diet tomorrow.  Return to work: You may resume your work activities in one to two days or as indicated by your doctor.  What to expect after your surgery: Expect to have vaginal bleeding/discharge for 2-3 days and spotting for up to 10 days. It is not unusual to have soreness for up to 1-2 weeks. You may have a slight burning sensation when you urinate for the first day. Mild cramps may continue for a couple of days. You may have a regular period in 2-6 weeks.  Call your doctor for any of the following:  Excessive vaginal bleeding, saturating and changing one pad every hour.  Inability to urinate 6 hours after discharge from hospital.  Pain not relieved by pain medication.  Fever of 100.4 F or greater.  Unusual vaginal discharge or odor.

## 2018-04-28 NOTE — Anesthesia Procedure Notes (Signed)
Procedure Name: LMA Insertion Date/Time: 04/28/2018 7:36 AM Performed by: Suan Halter, CRNA Pre-anesthesia Checklist: Patient identified, Emergency Drugs available, Suction available and Patient being monitored Patient Re-evaluated:Patient Re-evaluated prior to induction Oxygen Delivery Method: Circle system utilized Preoxygenation: Pre-oxygenation with 100% oxygen Induction Type: IV induction Ventilation: Mask ventilation without difficulty LMA: LMA inserted LMA Size: 4.0 and 3.0 Number of attempts: 1 Airway Equipment and Method: Bite block Placement Confirmation: positive ETCO2 Tube secured with: Tape Dental Injury: Teeth and Oropharynx as per pre-operative assessment

## 2018-04-28 NOTE — Op Note (Addendum)
OPERATIVE NOTE 04/28/18   Surgeon: Mart Piggs, MD  Assistants: none  Anesthesia: General LMA anesthesia  Pre-operative Diagnosis:  At least Stage 2B SCCa Cervical Cancer  Post-operative Diagnosis: At least clinical Stage 3A SCCa Cervical Cancer  Operation:  Exam under anesthesia due to intolerance for vaginal exam in office Cystoscopy due to concern for extension to posterior bladder (from anterior vaginal wall lesion)  Operative Findings:   1. Parametrial involvement on right  2. Right sided subvaginal tumor burden ~3cm. This approximates the right ureteral insertion into the bladder based on my exam during cystoscopy. 3. Extension of disease down upper half of vagina on lateral and posterior walls. 4. Extension of disease down to lower third of anterior vagina (versus skip lesion) thus making her Stage 3A 5. Cystoscopy revealed patent bilateral ureteral orifices and no bladder mucosa invasion or areas of concern. 6. Rectal exam grossly no disease.  Estimated Blood Loss:  less than 50 mL       Specimens: none          Complications:  None; patient tolerated the procedure well.         Disposition: PACU - hemodynamically stable.  Procedure Details:  The patient was then taken to the operating room and placed in the supine position with SCD hose on. General anesthesia was then induced without difficulty. She was then placed in the dorsolithotomy position. The perineum was prepped with Betadine. The vagina was prepped with Betadine.  There was some bleeding after the prep.  The patient was then draped after the prep was dried.   Timeout was performed the patient, procedure, antibiotic, allergy, and length of procedure.   Palpation of the vaginal wall revealed extension of disease down at least to the upper half of the vagina on most surfaces and then the lower third of the vagina on the anterior surface.  Given my concern for possible bladder wall invasion with the  anterior lesion I proceeded with cystoscopy.  After the cystoscopy was set up by the operating room staff the urethra was entered with the cystoscope.  Clear urine was present in the bladder.  Some fluid was inserted to provide better visualization.  Bilateral ureteral orifice ease were visualized.  The left had immediate efflux.  However the right was somewhat sluggish.  After manipulation per vagina with anticipated stimulation of the ureter I did note that that sub-vaginal lesion felt very close to the estimated insertion site of the distal right ureter into the bladder.  However after manipulation there was brisk efflux of urine seen from the right orifice.  Examination of the trigone revealed no areas of concern, no bullous changes.  Upon exit there was no obvious disease in the urethra.  Bimanual exam performed with findings as above most significant findings being the necrotic tumor that extended down the vagina and then on the patient's right a sub-vaginal lesion.  On rectovaginal the rectal wall was smooth but this right-sided sub-vaginal lesion feels to be about 3 cm.  It is presumed there is and feels like there is some parametrial involvement on the right it is difficult with this a vaginal lesion to be sure.  The left parametria and sidewall feels free of disease.  The vaginal canal was cleansed of any residual blood clot.  No significant hemorrhage seen. Monsel's was then applied.   Counts were correct x2. The patient tolerated the procedure well and was taken recovery room in stable condition.

## 2018-04-28 NOTE — Interval H&P Note (Signed)
History and Physical Interval Note:  04/28/2018 7:24 AM  Erin Kent  has presented today for surgery, with the diagnosis of CERVICAL CANCER  The various methods of treatment have been discussed with the patient and family. After consideration of risks, benefits and other options for treatment, the patient has consented to  Procedure(s): EXAM UNDER ANESTHESIA (N/A) as a surgical intervention .  The patient's history has been reviewed, patient examined, no change in status, stable for surgery.  I have reviewed the patient's chart and labs.  Questions were answered to the patient's satisfaction.     Isabel Caprice

## 2018-04-28 NOTE — Telephone Encounter (Signed)
Called [facility] and discussed check in process for patient's appointments on Wednesday and Thursday.  Advised her that I will meet them in the lobby on Wednesday and show them where the radiation department is.  She verbalized understanding and agreement.

## 2018-04-28 NOTE — Progress Notes (Signed)
GYN Location of Tumor / Histology: Stage 2B SCCa Cervical Cancer  Erin Kent presented with symptoms of: Per Dr. Gerarda Fraction note 04/18/2018: "The friend states the patient came to her in June stating she was bleeding and so appointments were made for follow-up at that time.  At one point she was passing large clots.  The patient has not had a gynecologic exam for many years.  It is difficult to get some of the history from the patient and so both the patient and her friend explained some of the course thus far to me.  The patient has a difficult time with office examination and so she was taken by Dr. Elly Modena for exam under anesthesia Pap smear and was found to have a cervical mass which was biopsied 04/12/2018.  Unfortunately the biopsy shows invasive squamous cell carcinoma and so the patient is referred for recommendations and continued management"   PET: Large cervical mass may invade into the myometrium in down into the vagina.  There is a malignant left external iliac node measuring 1.4 cm in short.  2. Accentuated activity in the cecum and ascending colon is likely physiologic given that it has no CT correlate. Correlation with the patient's colon cancer screening history is recommended. If screening is not up-to-date, appropriate screening should be considered.  Biopsies revealed:  04/12/2018 Diagnosis Cervix, biopsy, mass - INVASIVE SQUAMOUS CELL CARCINOMA - SEE COMMENT  Pap Smear 04/12/2018 Adequacy Satisfactory but limited for evaluation see comment. Partially obscuring inflammation is present.Abnormal    Diagnosis - SQUAMOUS CELL CARCINOMAAbnormal    Diagnosis - SEE COMMENTAbnormal    Material Submitted CervicoVaginal Pap [ThinPrep Imaged]Abnormal      Past/Anticipated interventions by Gyn/Onc surgery, if any:  04/28/2018 Surgeon: Dr. Precious Haws Exam under anesthesia due to intolerance for vaginal exam in office. Cystoscopy due to concern for extension to posterior bladder  (from anterior vaginal wall lesion)  Operative Findings:   1. Parametrial involvement on right  2. Right sided subvaginal tumor burden ~3cm. This approximates the right ureteral insertion into the bladder based on my exam during cystoscopy. 3. Extension of disease down upper half of vagina on lateral and posterior walls. 4. Extension of disease down to lower third of anterior vagina (versus skip lesion) thus making her Stage 3A 5. Cystoscopy revealed patent bilateral ureteral orifices and no bladder mucosa invasion or areas of concern. 6. Rectal exam grossly no disease.  Past/Anticipated interventions by medical oncology, if any: Dr. Alvy Bimler 05/05/2018  Weight changes, if any: No, has lost about 20 pounds over the last year.  Has had a poor diet, eating junk food, loss of appetite.  Bowel/Bladder complaints, if any: No  Nausea/Vomiting, if any: No  Pain issues, if any:  No  BP 116/76 (Patient Position: Sitting)   Pulse 67   Temp 98.3 F (36.8 C) (Oral)   Resp 18   Ht 5\' 1"  (1.549 m)   Wt 81 lb 6.4 oz (36.9 kg)   SpO2 100%   BMI 15.38 kg/m   Wt Readings from Last 3 Encounters:  05/04/18 81 lb 6.4 oz (36.9 kg)  04/28/18 84 lb 1.6 oz (38.1 kg)  04/18/18 88 lb 12.8 oz (40.3 kg)   SAFETY ISSUES:  Prior radiation? No  Pacemaker/ICD? No  Possible current pregnancy? No  Is the patient on methotrexate? No  Current Complaints / other details:

## 2018-04-28 NOTE — Anesthesia Preprocedure Evaluation (Signed)
Anesthesia Evaluation  Patient identified by MRN, date of birth, ID band Patient awake    Reviewed: Allergy & Precautions, NPO status , Patient's Chart, lab work & pertinent test results  Airway Mallampati: I       Dental no notable dental hx. (+) Teeth Intact   Pulmonary neg pulmonary ROS,    Pulmonary exam normal breath sounds clear to auscultation       Cardiovascular negative cardio ROS Normal cardiovascular exam Rhythm:Regular Rate:Normal     Neuro/Psych negative neurological ROS  negative psych ROS   GI/Hepatic negative GI ROS, Neg liver ROS,   Endo/Other  negative endocrine ROS  Renal/GU negative Renal ROS  negative genitourinary   Musculoskeletal negative musculoskeletal ROS (+)   Abdominal Normal abdominal exam  (+)   Peds negative pediatric ROS (+)  Hematology negative hematology ROS (+) anemia ,   Anesthesia Other Findings Cervical Cancer  Reproductive/Obstetrics negative OB ROS                             Anesthesia Physical  Anesthesia Plan  ASA: III  Anesthesia Plan: General   Post-op Pain Management:    Induction: Intravenous  PONV Risk Score and Plan: 3 and Ondansetron, Dexamethasone, Treatment may vary due to age or medical condition and Midazolam  Airway Management Planned: LMA  Additional Equipment:   Intra-op Plan:   Post-operative Plan: Extubation in OR  Informed Consent: I have reviewed the patients History and Physical, chart, labs and discussed the procedure including the risks, benefits and alternatives for the proposed anesthesia with the patient or authorized representative who has indicated his/her understanding and acceptance.   Dental advisory given  Plan Discussed with: CRNA and Surgeon  Anesthesia Plan Comments:         Anesthesia Quick Evaluation

## 2018-04-29 ENCOUNTER — Encounter (HOSPITAL_BASED_OUTPATIENT_CLINIC_OR_DEPARTMENT_OTHER): Payer: Self-pay | Admitting: Obstetrics

## 2018-05-02 ENCOUNTER — Inpatient Hospital Stay: Payer: Medicaid Other | Admitting: Obstetrics

## 2018-05-04 ENCOUNTER — Encounter: Payer: Self-pay | Admitting: Radiation Oncology

## 2018-05-04 ENCOUNTER — Ambulatory Visit
Admission: RE | Admit: 2018-05-04 | Discharge: 2018-05-04 | Disposition: A | Discharge status: Home / Self Care | Payer: Medicaid Other | Source: Ambulatory Visit | Attending: Radiation Oncology | Admitting: Radiation Oncology

## 2018-05-04 ENCOUNTER — Encounter: Payer: Self-pay | Admitting: Oncology

## 2018-05-04 ENCOUNTER — Other Ambulatory Visit: Payer: Self-pay

## 2018-05-04 VITALS — BP 116/76 | HR 67 | Temp 98.3°F | Resp 18 | Ht 61.0 in | Wt 81.4 lb

## 2018-05-04 DIAGNOSIS — C539 Malignant neoplasm of cervix uteri, unspecified: Secondary | ICD-10-CM | POA: Insufficient documentation

## 2018-05-04 NOTE — Progress Notes (Signed)
Radiation Oncology         [phone] ________________________________  Initial Outpatient Consultation  Name: Erin Kent MRN: [id number]  Date: 05/04/2018  DOB: 1950-10-02  LX:[id number], [name]  Isabel Caprice, MD   REFERRING PHYSICIAN: Isabel Caprice, MD  DIAGNOSIS: FIGO Stage IIIC1, invasive squamous cell carcinoma to cervix (FIGO 2018 staging)   HISTORY OF PRESENT ILLNESS::Erin Kent is a 67 y.o. female who is presenting to the office today for evaluation of newly diagnosed cervical cancer. She is accompanied by her roommate, [name]. She initially presented with vaginal bleeding and was evaluated in the Emergency Department on 03/13/2018. She notes that she had trace vaginal bleeding prior to this episode. She is currently having to use a pad to collect the drainage from her vagina. She denies back pain or abdominal pain. She has lost 10 pounds, however, she hasn't weighed herself in the past year. The patient has a lack of appetite and her roommate ensures that she takes a multivitamin, iron supplements, and ensure. 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 She lives in the [place] area.   She has underwent a routine pap smear on 04/12/2018 that showed: squamous cell carcinoma. On pathology it was noted: Cervix, biopsy, mass with invasive squamous cell carcinoma.   She then continued to a PET scan on 04/25/2018 that showed: Large cervical mass may invade into the myometrium in down into the vagina, maximum SUV 21.3. There is a malignant left external iliac node measuring 1.4 cm in short axis with maximum SUV 14.1. Accentuated activity in the cecum and ascending colon is likely physiologic given that it has no CT correlate. Correlation with the patient's colon cancer screening history is recommended. Aortic Atherosclerosis (ICD10-I70.0).  On review of systems, she reports mild vaginal discharge s/p recent procedure. she denies HA, double vision, and  any other symptoms. She denies family hx of breast or ovarian cancer. She doesn't have a PCP at this time, however, her roommate [name] is in the process of obtaining a PCP for her.   PREVIOUS RADIATION THERAPY: No  PAST MEDICAL HISTORY:  has a past medical history of Cervical cancer (HCC) and Iron deficiency anemia.    PAST SURGICAL HISTORY: Past Surgical History:  Procedure Laterality Date  . CYSTOSCOPY N/A 04/28/2018   Procedure: CYSTOSCOPY;  Surgeon: Isabel Caprice, MD;  Location: [facility];  Service: Gynecology;  Laterality: N/A;  . EUA/ PAPSMEAR/  CERVICAL MASS BIOPSY  04-12-2018   dr peggy constant @[facility]   . WISDOM TOOTH EXTRACTION      FAMILY HISTORY: family history includes Hypertension in her mother.  SOCIAL HISTORY:  reports that she has never smoked. She has never used smokeless tobacco. She reports that she does not drink alcohol or use drugs.  ALLERGIES: Patient has no known allergies.  MEDICATIONS:  Current Outpatient Medications  Medication Sig Dispense Refill  . acetaminophen (TYLENOL) 325 MG tablet Take 650 mg by mouth every 6 (six) hours as needed.    . ferrous sulfate (FERROUSUL) 325 (65 FE) MG tablet Take 1 tablet (325 mg total) by mouth 2 (two) times daily. 60 tablet 1  . ibuprofen (ADVIL,MOTRIN) 600 MG tablet Take 1 tablet (600 mg total) by mouth every 6 (six) hours as needed. 60 tablet 3  . Multiple Vitamins-Minerals (CENTRUM SILVER ULTRA WOMENS PO) Take 1 tablet by mouth daily.    Marland Kitchen oxyCODONE-acetaminophen (PERCOCET/ROXICET) 5-325 MG tablet Take 1-2 tablets  by mouth every 6 (six) hours as needed. (Patient not taking: Reported on 05/04/2018) 20 tablet 0   No current facility-administered medications for this encounter.     REVIEW OF SYSTEMS:  A 10+ POINT REVIEW OF SYSTEMS WAS OBTAINED including neurology, dermatology, psychiatry, cardiac, respiratory, lymph, extremities, GI, GU, musculoskeletal, constitutional, reproductive, HEENT. All pertinent  positives are noted in the HPI. All others are negative.    PHYSICAL EXAM:  height is 5\' 1"  (1.549 m) and weight is 81 lb 6.4 oz (36.9 kg). Her oral temperature is 98.3 F (36.8 C). Her blood pressure is 116/76 and her pulse is 67. Her respiration is 18 and oxygen saturation is 100%.   General: Alert and oriented, in no acute distress. Emaciated appearance.  HEENT: Head is normocephalic. Extraocular movements are intact. Oropharynx is clear. Neck: Neck is supple, no palpable cervical or supraclavicular lymphadenopathy. Heart: Regular in rate and rhythm with no murmurs, rubs, or gallops. Chest: Clear to auscultation bilaterally, with no rhonchi, wheezes, or rales. Abdomen: Soft, nontender, nondistended, with no rigidity or guarding. Extremities: No cyanosis or edema. Lymphatics: see Neck Exam Skin: No concerning lesions. Musculoskeletal: symmetric strength and muscle tone throughout. Neurologic: Cranial nerves II through XII are grossly intact. No obvious focalities. Speech is fluent. Coordination is intact. Psychiatric: Judgment and insight are intact. Affect is appropriate. Pelvic: The patient wasn't able to tolerate a pelvic exam due to pain. I was able to place an index finger in the vaginal vault and palpable tumor extended along the anterior vaginal wall to approximately the lower 1/3 of the vagina. No palpable inguinal adenopathy.   Exam under anesthesia by Dr. Gerarda Fraction revealed: Vulva: Normal external female genitalia.  Bladder/urethra: Urethral meatus normal in size and location. No lesions or masses, well supported bladder   Operative Findings:   1. Parametrial involvement on right  2. Right sided subvaginal tumor burden ~3cm. This approximates the right ureteral insertion into the bladder based on my exam during cystoscopy. 3. Extension of disease down upper half of vagina on lateral and posterior walls. 4. Extension of disease down to lower third of anterior vagina (versus skip  lesion) thus making her Stage 3A 5. Cystoscopy revealed patent bilateral ureteral orifices and no bladder mucosa invasion or areas of concern. Rectal exam grossly no disease.    ECOG = 1  0 - Asymptomatic (Fully active, able to carry on all predisease activities without restriction)  1 - Symptomatic but completely ambulatory (Restricted in physically strenuous activity but ambulatory and able to carry out work of a light or sedentary nature. For example, light housework, office work)  2 - Symptomatic, <50% in bed during the day (Ambulatory and capable of all self care but unable to carry out any work activities. Up and about more than 50% of waking hours)  3 - Symptomatic, >50% in bed, but not bedbound (Capable of only limited self-care, confined to bed or chair 50% or more of waking hours)  4 - Bedbound (Completely disabled. Cannot carry on any self-care. Totally confined to bed or chair)  5 - Death   Eustace Pen MM, Creech RH, Tormey DC, et al. [phone]). "Toxicity and response criteria of the [facility]". [place] Oncol. 5 (6): 649-55  LABORATORY DATA:  Lab Results  Component Value Date   WBC 8.1 04/12/2018   HGB 8.8 (L) 04/12/2018   HCT 27.8 (L) 04/12/2018   MCV 78.8 04/12/2018   PLT 311 04/12/2018   NEUTROABS 8.1 (H) 03/13/2018  Lab Results  Component Value Date   NA 141 03/13/2018   K 3.4 (L) 03/13/2018   CL 105 03/13/2018   CO2 28 03/13/2018   GLUCOSE 121 (H) 03/13/2018   CREATININE 0.71 03/13/2018   CALCIUM 9.2 03/13/2018      RADIOGRAPHY: Nm Pet Image Initial (pi) Skull Base To Thigh  Result Date: 04/25/2018 CLINICAL DATA:  Initial treatment strategy for cervical cancer. EXAM: NUCLEAR MEDICINE PET SKULL BASE TO THIGH TECHNIQUE: 5.0 mCi F-18 FDG was injected intravenously. Full-ring PET imaging was performed from the skull base to thigh after the radiotracer. CT data was obtained and used for attenuation correction and anatomic localization.  Fasting blood glucose: 93 mg/dl COMPARISON:  1.9 CT from 01/25/2012 and pelvic ultrasound from 03/13/2018 FINDINGS: Mediastinal blood pool activity: SUV max 2.1 NECK: Subtle asymmetry of the tonsillar tissues, with the maximum SUV on the right and 4.5 and on the left at 2.9. This is most likely to be incidental given the overall clinical scenario. Incidental CT findings: none CHEST: No significant abnormal hypermetabolic activity in this region. Incidental CT findings: Mild descending aortic atherosclerotic calcification. Minimal biapical pleuroparenchymal scarring. Linear subsegmental atelectasis or scarring in the left lower lobe. ABDOMEN/PELVIS: Prominent hypermetabolic activity associated with the cervical mass which appears to extend down towards vagina and vaginal vestibule, and possibly invade up into the myometrium. The upper portion of the mass has a maximum SUV of 21.3 and the lower portion 21.1. A left external iliac node measuring 1.4 cm in short axis on image 154/4 has a maximum SUV of 14.1, compatible with malignant involvement. There high activity in the cecum and ascending colon without definite CT correlate, maximum SUV 14.9, probably physiologic. Incidental CT findings: Aortoiliac atherosclerotic vascular disease. SKELETON: No significant abnormal hypermetabolic activity in this region. Incidental CT findings: none IMPRESSION: 1. Large cervical mass may invade into the myometrium in down into the vagina, maximum SUV 21.3. There is a malignant left external iliac node measuring 1.4 cm in short axis with maximum SUV 14.1. 2. Accentuated activity in the cecum and ascending colon is likely physiologic given that it has no CT correlate. Correlation with the patient's colon cancer screening history is recommended. If screening is not up-to-date, appropriate screening should be considered. 3.  Aortic Atherosclerosis (ICD10-I70.0). Electronically Signed   By: Van Clines M.D.   On: 04/25/2018 17:46        IMPRESSION: FIGO Stage IIIC1 Invasive squamous cell carcinoma to cervix.    Patient will be a good candidate for definitive course of radiation therapy along with radio sensitizing chemotherapy, radiation therapy would consist of 6 weeks external beam radiation therapy and 5 intracavitary brachytherapy treatments.   Today, I talked to the patient and caregiver about the findings and work-up thus far.  We discussed the natural history of cervical cancer and general treatment, highlighting the role of radiotherapy in the management.  We discussed the available radiation techniques, and focused on the details of logistics and delivery.  We reviewed the anticipated acute and late sequelae associated with radiation in this setting.  The patient was encouraged to ask questions that I answered to the best of my ability.  A patient consent form was discussed and signed.  We retained a copy for our records.  The patient would like to proceed with radiation and will be scheduled for CT simulation.   PLAN: Simulation appointment on tomorrow, 05/05/2018 at 3 PM.  Anticipate the patient will start her radiation and chemotherapy the week  of September 3.     ------------------------------------------------  Blair Promise, PhD, MD      This document serves as a record of services personally performed by Gery Pray, MD. It was created on his behalf by [facility], a trained medical scribe. The creation of this record is based on the scribe's personal observations and the provider's statements to them. This document has been checked and approved by the attending provider.

## 2018-05-05 ENCOUNTER — Telehealth: Payer: Self-pay | Admitting: Hematology and Oncology

## 2018-05-05 ENCOUNTER — Inpatient Hospital Stay (HOSPITAL_BASED_OUTPATIENT_CLINIC_OR_DEPARTMENT_OTHER): Payer: Medicaid Other | Admitting: Hematology and Oncology

## 2018-05-05 ENCOUNTER — Encounter: Payer: Self-pay | Admitting: Hematology and Oncology

## 2018-05-05 ENCOUNTER — Ambulatory Visit
Admission: RE | Admit: 2018-05-05 | Discharge: 2018-05-05 | Disposition: A | Discharge status: Home / Self Care | Payer: Medicaid Other | Source: Ambulatory Visit | Attending: Radiation Oncology | Admitting: Radiation Oncology

## 2018-05-05 VITALS — BP 110/70 | HR 86 | Temp 97.7°F | Resp 18 | Ht 61.0 in | Wt 82.1 lb

## 2018-05-05 DIAGNOSIS — R64 Cachexia: Secondary | ICD-10-CM

## 2018-05-05 DIAGNOSIS — D509 Iron deficiency anemia, unspecified: Secondary | ICD-10-CM | POA: Insufficient documentation

## 2018-05-05 DIAGNOSIS — C539 Malignant neoplasm of cervix uteri, unspecified: Secondary | ICD-10-CM

## 2018-05-05 DIAGNOSIS — D500 Iron deficiency anemia secondary to blood loss (chronic): Secondary | ICD-10-CM

## 2018-05-05 DIAGNOSIS — Z510 Encounter for antineoplastic radiation therapy: Secondary | ICD-10-CM | POA: Diagnosis present

## 2018-05-05 NOTE — Assessment & Plan Note (Signed)
She looks malnourished We discussed the importance of frequent small meals and nutritional supplement as tolerated

## 2018-05-05 NOTE — Progress Notes (Signed)
START OFF PATHWAY REGIMEN - Other Dx   [id number]:Cisplatin 40 mg/m2 IV D1 q7 Days + RT:   A cycle is every 7 days:     Cisplatin   **Always confirm dose/schedule in your pharmacy ordering system**  Patient Characteristics: Intent of Therapy: Curative Intent, Discussed with Patient 

## 2018-05-05 NOTE — Assessment & Plan Note (Signed)
I explained to the patient the treatment is for curative intent I explained to her why her disease is not resectable I recommend port placement, chemo education class and chemotherapy consent next week I recommend weekly cisplatin along with radiation therapy x5 weeks to start next week if possible

## 2018-05-05 NOTE — Progress Notes (Signed)
[place] CONSULT NOTE  Patient Care Team: Patient, [name] as PCP - General (General Practice)  ASSESSMENT & PLAN:  Malignant neoplasm of cervix ([place]) I explained to the patient the treatment is for curative intent I explained to her why her disease is not resectable I recommend port placement, chemo education class and chemotherapy consent next week I recommend weekly cisplatin along with radiation therapy x5 weeks to start next week if possible   Iron deficiency anemia She has signs of severe iron deficiency anemia and is taking oral iron supplement I will check iron studies next week and if is not improving, I will prescribe intravenous iron infusion  Cachexia ([name]) She looks malnourished We discussed the importance of frequent small meals and nutritional supplement as tolerated   Orders Placed This Encounter  Procedures  . IR IMAGING GUIDED PORT INSERTION    Standing Status:   Future    Standing Expiration Date:   07/06/2019    Order Specific Question:   Reason for Exam (SYMPTOM  OR DIAGNOSIS REQUIRED)    Answer:   need port for chemo    Order Specific Question:   Preferred Imaging Location?    Answer:   [facility]  . Comprehensive metabolic panel    Standing Status:   Standing    Number of Occurrences:   22    Standing Expiration Date:   05/06/2019  . CBC with Differential/Platelet    Standing Status:   Standing    Number of Occurrences:   22    Standing Expiration Date:   05/06/2019  . Magnesium    Standing Status:   Standing    Number of Occurrences:   22    Standing Expiration Date:   05/06/2019  . Iron and TIBC    Standing Status:   Future    Standing Expiration Date:   06/09/2019  . Ferritin    Standing Status:   Future    Standing Expiration Date:   06/09/2019  . HIV antibody (with reflex)    Standing Status:   Future    Standing Expiration Date:   05/05/2019     CHIEF COMPLAINTS/PURPOSE OF CONSULTATION:  Locally advanced  cervical cancer, for further management  HISTORY OF PRESENTING ILLNESS:  Erin Kent 67 y.o. female is here because of recent diagnosis of cervical cancer.  Her caregiver is present today Her symptoms began due to postmenopausal bleeding leading to multiple investigations and current diagnosis  I have reviewed her chart and materials related to her cancer extensively and collaborated history with the patient. Summary of oncologic history is as follows:   Malignant neoplasm of cervix ([name])   03/13/2018 Imaging    US pelvis There are mobile echogenic foci within the endometrial cavity suggestive of gas. This is nonspecific in etiology however may be secondary to endometritis. Correlate for history of recent instrumentation. This obscures visualization of the endometrial tissue and therefore a follow-up pelvic ultrasound in 2-4 weeks is recommended after resolution of the acute symptomatology if symptoms persist to further evaluate the endometrium.     03/13/2018 Initial Diagnosis    She initially presented with PMB    04/12/2018 Pathology Results    Cervix, biopsy, mass - INVASIVE SQUAMOUS CELL CARCINOMA - SEE COMMENT    04/12/2018 Surgery    PREOPERATIVE DIAGNOSIS:  Postmenopausal vaginal bleeding. POSTOPERATIVE DIAGNOSIS: The same PROCEDURE: Exam under anesthesia, pap smear, cervical mass biopsy SURGEON:  Dr. [name]  INDICATIONS: 66  y.o. yo [id number] with PMB here for exam under anesthesia.Risks of surgery were discussed with the patient including but not limited to: bleeding which may require transfusion; infection which may require antibiotics; injury to uterus or surrounding organs; need for additional procedures including laparotomy or laparoscopy; and other postoperative/anesthesia complications. Written informed consent was obtained.    FINDINGS:  An 8-week size midline uterus.  No adnexal mass palpable on exam. Normal cervix not visualized. Friable mass seen in involving  the vagina at the level of the cervix obliterating the anterior and posterior fornix.  ANESTHESIA:   General INTRAVENOUS FLUIDS:  300 ml of LR ESTIMATED BLOOD LOSS: 20 ml. SPECIMENS: pap smear, cervical mass biopsy COMPLICATIONS:  None immediate.     04/25/2018 PET scan    1. Large cervical mass may invade into the myometrium in down into the vagina, maximum SUV 21.3. There is a malignant left external iliac node measuring 1.4 cm in short axis with maximum SUV 14.1. 2. Accentuated activity in the cecum and ascending colon is likely physiologic given that it has no CT correlate. Correlation with the patient's colon cancer screening history is recommended. If screening is not up-to-date, appropriate screening should be considered. 3.  Aortic Atherosclerosis (ICD10-I70.0).     04/28/2018 Surgery    Pre-operative Diagnosis:  At least Stage 2B SCCa Cervical Cancer  Post-operative Diagnosis: At least clinical Stage 3A SCCa Cervical Cancer  Operation:  Exam under anesthesia due to intolerance for vaginal exam in office Cystoscopy due to concern for extension to posterior bladder (from anterior vaginal wall lesion)  Operative Findings:   1. Parametrial involvement on right  2. Right sided subvaginal tumor burden ~3cm. This approximates the right ureteral insertion into the bladder based on my exam during cystoscopy. 3. Extension of disease down upper half of vagina on lateral and posterior walls. 4. Extension of disease down to lower third of anterior vagina (versus skip lesion) thus making her Stage 3A 5. Cystoscopy revealed patent bilateral ureteral orifices and no bladder mucosa invasion or areas of concern. 6. Rectal exam grossly no disease.     05/04/2018 Cancer Staging    Staging form: Cervix Uteri, AJCC 8th Edition - Clinical: Stage IIIB (cT3b, cN1, cM0) - Signed by [name], MD on 05/04/2018    She denies further bleeding.  Denies pain.  She has lost a lot of weight since  the last few months. Her baseline weight is approximately 100 pounds Her appetite is improving.  Denies recent changes in bowel habits She denies dizziness due to recent anemia  MEDICAL HISTORY:  Past Medical History:  Diagnosis Date  . Cervical cancer ([place])   . Iron deficiency anemia     SURGICAL HISTORY: Past Surgical History:  Procedure Laterality Date  . CYSTOSCOPY N/A 04/28/2018   Procedure: CYSTOSCOPY;  Surgeon: Isabel Caprice, MD;  Location: [facility];  Service: Gynecology;  Laterality: N/A;  . EUA/ PAPSMEAR/  CERVICAL MASS BIOPSY  04-12-2018   dr peggy constant @[facility]   . WISDOM TOOTH EXTRACTION      SOCIAL HISTORY: Social History   Socioeconomic History  . Marital status: Single    Spouse name: Not on file  . Number of children: 0  . Years of education: Not on file  . Highest education level: Not on file  Occupational History  . Not on file  Social Needs  . Financial resource strain: Not on file  . Food insecurity:    Worry: Not on file  Inability: Not on file  . Transportation needs:    Medical: Not on file    Non-medical: Not on file  Tobacco Use  . Smoking status: Never Smoker  . Smokeless tobacco: Never Used  Substance and Sexual Activity  . Alcohol use: No  . Drug use: No  . Sexual activity: Not Currently    Birth control/protection: None  Lifestyle  . Physical activity:    Days per week: Not on file    Minutes per session: Not on file  . Stress: Not on file  Relationships  . Social connections:    Talks on phone: Not on file    Gets together: Not on file    Attends religious service: Not on file    Active member of club or organization: Not on file    Attends meetings of clubs or organizations: Not on file    Relationship status: Not on file  . Intimate partner violence:    Fear of current or ex partner: Not on file    Emotionally abused: Not on file    Physically abused: Not on file    Forced sexual activity: Not on file   Other Topics Concern  . Not on file  Social History Narrative  . Not on file    FAMILY HISTORY: Family History  Problem Relation Age of Onset  . Hypertension Mother     ALLERGIES:  has No Known Allergies.  MEDICATIONS:  Current Outpatient Medications  Medication Sig Dispense Refill  . acetaminophen (TYLENOL) 325 MG tablet Take 650 mg by mouth every 6 (six) hours as needed.    . ferrous sulfate (FERROUSUL) 325 (65 FE) MG tablet Take 1 tablet (325 mg total) by mouth 2 (two) times daily. 60 tablet 1  . ibuprofen (ADVIL,MOTRIN) 600 MG tablet Take 1 tablet (600 mg total) by mouth every 6 (six) hours as needed. 60 tablet 3  . Multiple Vitamins-Minerals (CENTRUM SILVER ULTRA WOMENS PO) Take 1 tablet by mouth daily.    Marland Kitchen oxyCODONE-acetaminophen (PERCOCET/ROXICET) 5-325 MG tablet Take 1-2 tablets by mouth every 6 (six) hours as needed. (Patient not taking: Reported on 05/04/2018) 20 tablet 0   No current facility-administered medications for this visit.     REVIEW OF SYSTEMS:   Constitutional: Denies fevers, chills or abnormal night sweats Eyes: Denies blurriness of vision, double vision or watery eyes Ears, nose, mouth, throat, and face: Denies mucositis or sore throat Respiratory: Denies cough, dyspnea or wheezes Cardiovascular: Denies palpitation, chest discomfort or lower extremity swelling Gastrointestinal:  Denies nausea, heartburn or change in bowel habits Skin: Denies abnormal skin rashes Lymphatics: Denies new lymphadenopathy or easy bruising  Neurological:Denies numbness, tingling or new weaknesses Behavioral/Psych: Mood is stable, no new changes  All other systems were reviewed with the patient and are negative.  PHYSICAL EXAMINATION: ECOG PERFORMANCE STATUS: 1 - Symptomatic but completely ambulatory  Vitals:   05/05/18 1234  BP: 110/70  Pulse: 86  Resp: 18  Temp: 97.7 F (36.5 C)  SpO2: 100%   Filed Weights   05/05/18 1234  Weight: 82 lb 1.6 oz (37.2 kg)     GENERAL:alert, no distress and comfortable.  She looks thin and cachectic SKIN: skin color, texture, turgor are normal, no rashes or significant lesions EYES: normal, conjunctiva are pink and non-injected, sclera clear OROPHARYNX:no exudate, no erythema and lips, buccal mucosa, and tongue normal  NECK: supple, thyroid normal size, non-tender, without nodularity LYMPH:  no palpable lymphadenopathy in the cervical, axillary or inguinal LUNGS:  clear to auscultation and percussion with normal breathing effort HEART: regular rate & rhythm and no murmurs and no lower extremity edema ABDOMEN:abdomen soft, non-tender and normal bowel sounds Musculoskeletal:no cyanosis of digits and no clubbing  PSYCH: alert & oriented x 3 with fluent speech NEURO: no focal motor/sensory deficits  LABORATORY DATA:  I have reviewed the data as listed Lab Results  Component Value Date   WBC 8.1 04/12/2018   HGB 8.8 (L) 04/12/2018   HCT 27.8 (L) 04/12/2018   MCV 78.8 04/12/2018   PLT 311 04/12/2018   Recent Labs    03/13/18 1125  NA 141  K 3.4*  CL 105  CO2 28  GLUCOSE 121*  BUN 9  CREATININE 0.71  CALCIUM 9.2  GFRNONAA >60  GFRAA >60  PROT 7.4  ALBUMIN 3.8  AST 19  ALT 8  ALKPHOS 57  BILITOT 0.6    RADIOGRAPHIC STUDIES: I have personally reviewed the radiological images as listed and agreed with the findings in the report. Nm Pet Image Initial (pi) Skull Base To Thigh  Result Date: 04/25/2018 CLINICAL DATA:  Initial treatment strategy for cervical cancer. EXAM: NUCLEAR MEDICINE PET SKULL BASE TO THIGH TECHNIQUE: 5.0 mCi F-18 FDG was injected intravenously. Full-ring PET imaging was performed from the skull base to thigh after the radiotracer. CT data was obtained and used for attenuation correction and anatomic localization. Fasting blood glucose: 93 mg/dl COMPARISON:  1.9 CT from 01/25/2012 and pelvic ultrasound from 03/13/2018 FINDINGS: Mediastinal blood pool activity: SUV max 2.1 NECK:  Subtle asymmetry of the tonsillar tissues, with the maximum SUV on the right and 4.5 and on the left at 2.9. This is most likely to be incidental given the overall clinical scenario. Incidental CT findings: none CHEST: No significant abnormal hypermetabolic activity in this region. Incidental CT findings: Mild descending aortic atherosclerotic calcification. Minimal biapical pleuroparenchymal scarring. Linear subsegmental atelectasis or scarring in the left lower lobe. ABDOMEN/PELVIS: Prominent hypermetabolic activity associated with the cervical mass which appears to extend down towards vagina and vaginal vestibule, and possibly invade up into the myometrium. The upper portion of the mass has a maximum SUV of 21.3 and the lower portion 21.1. A left external iliac node measuring 1.4 cm in short axis on image 154/4 has a maximum SUV of 14.1, compatible with malignant involvement. There high activity in the cecum and ascending colon without definite CT correlate, maximum SUV 14.9, probably physiologic. Incidental CT findings: Aortoiliac atherosclerotic vascular disease. SKELETON: No significant abnormal hypermetabolic activity in this region. Incidental CT findings: none IMPRESSION: 1. Large cervical mass may invade into the myometrium in down into the vagina, maximum SUV 21.3. There is a malignant left external iliac node measuring 1.4 cm in short axis with maximum SUV 14.1. 2. Accentuated activity in the cecum and ascending colon is likely physiologic given that it has no CT correlate. Correlation with the patient's colon cancer screening history is recommended. If screening is not up-to-date, appropriate screening should be considered. 3.  Aortic Atherosclerosis (ICD10-I70.0). Electronically Signed   By: Van Clines M.D.   On: 04/25/2018 17:46    I spent 55 minutes counseling the patient face to face. The total time spent in the appointment was 60 minutes and more than 50% was on counseling.  All  questions were answered. The patient knows to call the clinic with any problems, questions or concerns.  [name], MD 05/05/2018 2:03 PM

## 2018-05-05 NOTE — Telephone Encounter (Signed)
Waiting for approval for 8/30

## 2018-05-05 NOTE — Assessment & Plan Note (Signed)
She has signs of severe iron deficiency anemia and is taking oral iron supplement I will check iron studies next week and if is not improving, I will prescribe intravenous iron infusion

## 2018-05-05 NOTE — Progress Notes (Signed)
  Radiation Oncology         [phone] ________________________________  Name: Erin Kent MRN: [id number]  Date: 05/05/2018  DOB: [birth year]  SIMULATION AND TREATMENT PLANNING NOTE    ICD-10-CM   1. Malignant neoplasm of cervix, unspecified site [facility]) C53.9     DIAGNOSIS:  Stage IIIC1, invasive squamous cell carcinoma to cervix (FIGO 2018 Staging)  NARRATIVE:  The patient was brought to the [place].  Identity was confirmed.  All relevant records and images related to the planned course of therapy were reviewed.  The patient freely provided informed written consent to proceed with treatment after reviewing the details related to the planned course of therapy. The consent form was witnessed and verified by the simulation staff.  Then, the patient was set-up in a stable reproducible  supine position for radiation therapy.  CT images were obtained.  Surface markings were placed.  The CT images were loaded into the planning software.  Then the target and avoidance structures were contoured.  Treatment planning then occurred.  The radiation prescription was entered and confirmed.  Then, I designed and supervised the construction of a total of 2 medically necessary complex treatment devices.  I have requested : Intensity Modulated Radiotherapy (IMRT) is medically necessary for this case for the following reason:  Small bowel sparing. I have ordered: dose calc.  PLAN:  The patient will receive 45 Gy in 25 fractions. She will then proceed to a boost to the right parametrial area and PET positive left external iliac for 9 Gy in 5 fractions. Then she'll proceed with 5 intracavitary brachytherapy treatments. In addition, the patient will receive radiosensitizing chemotherapy during her IMRT treatments.   -----------------------------------  Blair Promise, PhD, MD  This document serves as a record of services personally performed by Gery Pray, MD. It was created on his behalf  by Wilburn Mylar, a trained medical scribe. The creation of this record is based on the scribe's personal observations and the provider's statements to them. This document has been checked and approved by the attending provider.

## 2018-05-10 ENCOUNTER — Inpatient Hospital Stay: Payer: Medicaid Other

## 2018-05-10 ENCOUNTER — Inpatient Hospital Stay (HOSPITAL_BASED_OUTPATIENT_CLINIC_OR_DEPARTMENT_OTHER): Payer: Medicaid Other | Admitting: Hematology and Oncology

## 2018-05-10 ENCOUNTER — Other Ambulatory Visit: Payer: Self-pay | Admitting: Radiology

## 2018-05-10 ENCOUNTER — Telehealth: Payer: Self-pay | Admitting: Hematology and Oncology

## 2018-05-10 VITALS — BP 112/65 | HR 83 | Temp 98.5°F | Resp 18 | Ht 61.0 in | Wt 85.2 lb

## 2018-05-10 DIAGNOSIS — D500 Iron deficiency anemia secondary to blood loss (chronic): Secondary | ICD-10-CM

## 2018-05-10 DIAGNOSIS — R64 Cachexia: Secondary | ICD-10-CM | POA: Diagnosis not present

## 2018-05-10 DIAGNOSIS — C539 Malignant neoplasm of cervix uteri, unspecified: Secondary | ICD-10-CM | POA: Diagnosis not present

## 2018-05-10 LAB — CBC WITH DIFFERENTIAL/PLATELET
Basophils Absolute: 0 10*3/uL (ref 0.0–0.1)
Basophils Relative: 0 %
EOS ABS: 0.1 10*3/uL (ref 0.0–0.5)
Eosinophils Relative: 1 %
HEMATOCRIT: 30.8 % — AB (ref 34.8–46.6)
HEMOGLOBIN: 9 g/dL — AB (ref 11.6–15.9)
Lymphocytes Relative: 17 %
Lymphs Abs: 1.5 10*3/uL (ref 0.9–3.3)
MCH: 23.4 pg — AB (ref 25.1–34.0)
MCHC: 29.2 g/dL — ABNORMAL LOW (ref 31.5–36.0)
MCV: 80.2 fL (ref 79.5–101.0)
Monocytes Absolute: 0.8 10*3/uL (ref 0.1–0.9)
Monocytes Relative: 9 %
NEUTROS ABS: 6.7 10*3/uL — AB (ref 1.5–6.5)
NEUTROS PCT: 73 %
Platelets: 261 10*3/uL (ref 145–400)
RBC: 3.84 MIL/uL (ref 3.70–5.45)
RDW: 16.4 % — ABNORMAL HIGH (ref 11.2–14.5)
WBC: 9.3 10*3/uL (ref 3.9–10.3)

## 2018-05-10 LAB — COMPREHENSIVE METABOLIC PANEL
ALT: 7 U/L (ref 0–44)
ANION GAP: 6 (ref 5–15)
AST: 10 U/L — ABNORMAL LOW (ref 15–41)
Albumin: 3.4 g/dL — ABNORMAL LOW (ref 3.5–5.0)
Alkaline Phosphatase: 78 U/L (ref 38–126)
BUN: 17 mg/dL (ref 8–23)
CHLORIDE: 104 mmol/L (ref 98–111)
CO2: 29 mmol/L (ref 22–32)
Calcium: 9.1 mg/dL (ref 8.9–10.3)
Creatinine, Ser: 0.75 mg/dL (ref 0.44–1.00)
Glucose, Bld: 97 mg/dL (ref 70–99)
POTASSIUM: 4.6 mmol/L (ref 3.5–5.1)
SODIUM: 139 mmol/L (ref 135–145)
Total Bilirubin: <0.2 mg/dL — ABNORMAL LOW (ref 0.3–1.2)
Total Protein: 7.2 g/dL (ref 6.5–8.1)

## 2018-05-10 LAB — IRON AND TIBC
IRON: 20 ug/dL — AB (ref 41–142)
SATURATION RATIOS: 5 % — AB (ref 21–57)
TIBC: 414 ug/dL (ref 236–444)
UIBC: 393 ug/dL

## 2018-05-10 LAB — MAGNESIUM: MAGNESIUM: 2.1 mg/dL (ref 1.7–2.4)

## 2018-05-10 LAB — FERRITIN: FERRITIN: 7 ng/mL — AB (ref 11–307)

## 2018-05-10 MED ORDER — LIDOCAINE-PRILOCAINE 2.5-2.5 % EX CREA: TOPICAL_CREAM | CUTANEOUS | 3 refills | Status: DC

## 2018-05-10 MED ORDER — ONDANSETRON HCL 8 MG PO TABS: 8.0000 mg | ORAL_TABLET | Freq: Three times a day (TID) | ORAL | 1 refills | Status: DC | PRN

## 2018-05-10 MED ORDER — PROCHLORPERAZINE MALEATE 10 MG PO TABS: 10.0000 mg | ORAL_TABLET | Freq: Four times a day (QID) | ORAL | 1 refills | Status: DC | PRN

## 2018-05-10 NOTE — Telephone Encounter (Signed)
Gave Pt avs and calendar of upcoming appts.

## 2018-05-11 ENCOUNTER — Telehealth: Payer: Self-pay

## 2018-05-11 ENCOUNTER — Other Ambulatory Visit: Payer: Self-pay | Admitting: Radiology

## 2018-05-11 ENCOUNTER — Encounter: Payer: Self-pay | Admitting: Hematology and Oncology

## 2018-05-11 LAB — HIV ANTIBODY (ROUTINE TESTING W REFLEX): HIV SCREEN 4TH GENERATION: NONREACTIVE

## 2018-05-11 NOTE — Progress Notes (Signed)
[facility] OFFICE PROGRESS NOTE  Erin Kent Care Team: Erin Kent, [name] as PCP - General (General Practice)  ASSESSMENT & PLAN:  Malignant neoplasm of cervix ([place]) We discussed the role of concurrent chemoradiation therapy We will start treatment this week  We discussed the role of chemotherapy. The intent is of curative intent.  We discussed some of the risks, benefits, side-effects of cisplatin and its role as chemo sensitizing agent. The plan for weekly cisplatin for x5 doses along with radiation treatment.  Some of the short term side-effects included, though not limited to, including weight loss, life threatening infections, risk of allergic reactions, need for transfusions of blood products, nausea, vomiting, change in bowel habits, loss of hair, admission to hospital for various reasons, and risks of death.   Long term side-effects are also discussed including risks of infertility, permanent damage to nerve function, hearing loss, chronic fatigue, kidney damage with possibility needing hemodialysis, and rare secondary malignancy including bone marrow disorders.  The Erin Kent is aware that the response rates discussed earlier is not guaranteed.  After a long discussion, Erin Kent made an informed decision to proceed with the prescribed plan of care.   Erin Kent education material was dispensed. I will see her on a weekly basis for toxicity review    Iron deficiency anemia She has confirmed iron deficiency anemia I recommend intravenous iron replacement therapy The most likely cause of her anemia is due to chronic blood loss/malabsorption syndrome. We discussed some of the risks, benefits, and alternatives of intravenous iron infusions. The Erin Kent is symptomatic from anemia and the iron level is critically low. She tolerated oral iron supplement poorly and desires to achieved higher levels of iron faster for adequate hematopoesis. Some of the side-effects to be expected  including risks of infusion reactions, phlebitis, headaches, nausea and fatigue.  The Erin Kent is willing to proceed. Erin Kent education material was dispensed.  Goal is to keep ferritin level greater than 50 and normalization of blood count   Cachexia ([place]) She is doing well with additional nutritional supplement She has gained some weight I congratulated her effort and recommend she continues the same   No orders of the defined types were placed in this encounter.   INTERVAL HISTORY: Please see below for problem oriented charting. She returns for further follow-up She has gained some weight since the last time I saw her Denies recent infection, fever or chills Denies recent vaginal bleeding Denies shortness of breath No recent pelvic pain  SUMMARY OF ONCOLOGIC HISTORY:   Malignant neoplasm of cervix ([place])   03/13/2018 Imaging    US pelvis There are mobile echogenic foci within the endometrial cavity suggestive of gas. This is nonspecific in etiology however may be secondary to endometritis. Correlate for history of recent instrumentation. This obscures visualization of the endometrial tissue and therefore a follow-up pelvic ultrasound in 2-4 weeks is recommended after resolution of the acute symptomatology if symptoms persist to further evaluate the endometrium.     03/13/2018 Initial Diagnosis    She initially presented with PMB    04/12/2018 Pathology Results    Cervix, biopsy, mass - INVASIVE SQUAMOUS CELL CARCINOMA - SEE COMMENT    04/12/2018 Surgery    PREOPERATIVE DIAGNOSIS:  Postmenopausal vaginal bleeding. POSTOPERATIVE DIAGNOSIS: The same PROCEDURE: Exam under anesthesia, pap smear, cervical mass biopsy SURGEON:  Dr. [name]  INDICATIONS: 67 y.o. yo [id number] with PMB here for exam under anesthesia.Risks of surgery were discussed with the Erin Kent including but not limited  to: bleeding which may require transfusion; infection which may require antibiotics;  injury to uterus or surrounding organs; need for additional procedures including laparotomy or laparoscopy; and other postoperative/anesthesia complications. Written informed consent was obtained.    FINDINGS:  An 8-week size midline uterus.  No adnexal mass palpable on exam. Normal cervix not visualized. Friable mass seen in involving the vagina at the level of the cervix obliterating the anterior and posterior fornix.  ANESTHESIA:   General INTRAVENOUS FLUIDS:  300 ml of LR ESTIMATED BLOOD LOSS: 20 ml. SPECIMENS: pap smear, cervical mass biopsy COMPLICATIONS:  None immediate.     04/25/2018 PET scan    1. Large cervical mass may invade into the myometrium in down into the vagina, maximum SUV 21.3. There is a malignant left external iliac node measuring 1.4 cm in short axis with maximum SUV 14.1. 2. Accentuated activity in the cecum and ascending colon is likely physiologic given that it has no CT correlate. Correlation with the Erin Kent's colon cancer screening history is recommended. If screening is not up-to-date, appropriate screening should be considered. 3.  Aortic Atherosclerosis (ICD10-I70.0).     04/28/2018 Surgery    Pre-operative Diagnosis:  At least Stage 2B SCCa Cervical Cancer  Post-operative Diagnosis: At least clinical Stage 3A SCCa Cervical Cancer  Operation:  Exam under anesthesia due to intolerance for vaginal exam in office Cystoscopy due to concern for extension to posterior bladder (from anterior vaginal wall lesion)  Operative Findings:   1. Parametrial involvement on right  2. Right sided subvaginal tumor burden ~3cm. This approximates the right ureteral insertion into the bladder based on my exam during cystoscopy. 3. Extension of disease down upper half of vagina on lateral and posterior walls. 4. Extension of disease down to lower third of anterior vagina (versus skip lesion) thus making her Stage 3A 5. Cystoscopy revealed patent bilateral ureteral  orifices and no bladder mucosa invasion or areas of concern. 6. Rectal exam grossly no disease.     05/04/2018 Cancer Staging    Staging form: Cervix Uteri, AJCC 8th Edition - Clinical: Stage IIIB (cT3b, cN1, cM0) - Signed by [name], MD on 05/04/2018     REVIEW OF SYSTEMS:   Constitutional: Denies fevers, chills or abnormal weight loss Eyes: Denies blurriness of vision Ears, nose, mouth, throat, and face: Denies mucositis or sore throat Respiratory: Denies cough, dyspnea or wheezes Cardiovascular: Denies palpitation, chest discomfort or lower extremity swelling Gastrointestinal:  Denies nausea, heartburn or change in bowel habits Skin: Denies abnormal skin rashes Lymphatics: Denies new lymphadenopathy or easy bruising Neurological:Denies numbness, tingling or new weaknesses Behavioral/Psych: Mood is stable, no new changes  All other systems were reviewed with the Erin Kent and are negative.  I have reviewed the past medical history, past surgical history, social history and family history with the Erin Kent and they are unchanged from previous note.  ALLERGIES:  has No Known Allergies.  MEDICATIONS:  Current Outpatient Medications  Medication Sig Dispense Refill  . acetaminophen (TYLENOL) 325 MG tablet Take 650 mg by mouth every 6 (six) hours as needed.    . ferrous sulfate (FERROUSUL) 325 (65 FE) MG tablet Take 1 tablet (325 mg total) by mouth 2 (two) times daily. 60 tablet 1  . ibuprofen (ADVIL,MOTRIN) 600 MG tablet Take 1 tablet (600 mg total) by mouth every 6 (six) hours as needed. 60 tablet 3  . lidocaine-prilocaine (EMLA) cream Apply to affected area once 30 g 3  . Multiple Vitamins-Minerals (CENTRUM SILVER ULTRA WOMENS PO)  Take 1 tablet by mouth daily.    . ondansetron (ZOFRAN) 8 MG tablet Take 1 tablet (8 mg total) by mouth every 8 (eight) hours as needed. Start on the third day after chemotherapy. 30 tablet 1  . oxyCODONE-acetaminophen (PERCOCET/ROXICET) 5-325 MG tablet  Take 1-2 tablets by mouth every 6 (six) hours as needed. (Erin Kent not taking: Reported on 05/04/2018) 20 tablet 0  . prochlorperazine (COMPAZINE) 10 MG tablet Take 1 tablet (10 mg total) by mouth every 6 (six) hours as needed (Nausea or vomiting). 30 tablet 1   No current facility-administered medications for this visit.     PHYSICAL EXAMINATION: ECOG PERFORMANCE STATUS: 1 - Symptomatic but completely ambulatory  Vitals:   05/10/18 0910  BP: 112/65  Pulse: 83  Resp: 18  Temp: 98.5 F (36.9 C)  SpO2: 100%   Filed Weights   05/10/18 0910  Weight: 85 lb 3.2 oz (38.6 kg)    GENERAL:alert, no distress and comfortable.  She looks thin and cachectic SKIN: skin color is pale, texture, turgor are normal, no rashes or significant lesions EYES: normal, Conjunctiva are pale and non-injected, sclera clear OROPHARYNX:no exudate, no erythema and lips, buccal mucosa, and tongue normal  NECK: supple, thyroid normal size, non-tender, without nodularity LYMPH:  no palpable lymphadenopathy in the cervical, axillary or inguinal LUNGS: clear to auscultation and percussion with normal breathing effort HEART: regular rate & rhythm and no murmurs and no lower extremity edema ABDOMEN:abdomen soft, non-tender and normal bowel sounds Musculoskeletal:no cyanosis of digits and no clubbing  NEURO: alert & oriented x 3 with fluent speech, no focal motor/sensory deficits  LABORATORY DATA:  I have reviewed the data as listed    Component Value Date/Time   NA 139 05/10/2018 0842   K 4.6 05/10/2018 0842   CL 104 05/10/2018 0842   CO2 29 05/10/2018 0842   GLUCOSE 97 05/10/2018 0842   BUN 17 05/10/2018 0842   CREATININE 0.75 05/10/2018 0842   CALCIUM 9.1 05/10/2018 0842   PROT 7.2 05/10/2018 0842   ALBUMIN 3.4 (L) 05/10/2018 0842   AST 10 (L) 05/10/2018 0842   ALT 7 05/10/2018 0842   ALKPHOS 78 05/10/2018 0842   BILITOT <0.2 (L) 05/10/2018 0842   GFRNONAA >60 05/10/2018 0842   GFRAA >60 05/10/2018  0842    No results found for: SPEP, UPEP  Lab Results  Component Value Date   WBC 9.3 05/10/2018   NEUTROABS 6.7 (H) 05/10/2018   HGB 9.0 (L) 05/10/2018   HCT 30.8 (L) 05/10/2018   MCV 80.2 05/10/2018   PLT 261 05/10/2018      Chemistry      Component Value Date/Time   NA 139 05/10/2018 0842   K 4.6 05/10/2018 0842   CL 104 05/10/2018 0842   CO2 29 05/10/2018 0842   BUN 17 05/10/2018 0842   CREATININE 0.75 05/10/2018 0842      Component Value Date/Time   CALCIUM 9.1 05/10/2018 0842   ALKPHOS 78 05/10/2018 0842   AST 10 (L) 05/10/2018 0842   ALT 7 05/10/2018 0842   BILITOT <0.2 (L) 05/10/2018 7619       RADIOGRAPHIC STUDIES: I have personally reviewed the radiological images as listed and agreed with the findings in the report. Nm Pet Image Initial (pi) Skull Base To Thigh  Result Date: 04/25/2018 CLINICAL DATA:  Initial treatment strategy for cervical cancer. EXAM: NUCLEAR MEDICINE PET SKULL BASE TO THIGH TECHNIQUE: 5.0 mCi F-18 FDG was injected intravenously. Full-ring PET imaging was  performed from the skull base to thigh after the radiotracer. CT data was obtained and used for attenuation correction and anatomic localization. Fasting blood glucose: 93 mg/dl COMPARISON:  1.9 CT from 01/25/2012 and pelvic ultrasound from 03/13/2018 FINDINGS: Mediastinal blood pool activity: SUV max 2.1 NECK: Subtle asymmetry of the tonsillar tissues, with the maximum SUV on the right and 4.5 and on the left at 2.9. This is most likely to be incidental given the overall clinical scenario. Incidental CT findings: none CHEST: No significant abnormal hypermetabolic activity in this region. Incidental CT findings: Mild descending aortic atherosclerotic calcification. Minimal biapical pleuroparenchymal scarring. Linear subsegmental atelectasis or scarring in the left lower lobe. ABDOMEN/PELVIS: Prominent hypermetabolic activity associated with the cervical mass which appears to extend down towards  vagina and vaginal vestibule, and possibly invade up into the myometrium. The upper portion of the mass has a maximum SUV of 21.3 and the lower portion 21.1. A left external iliac node measuring 1.4 cm in short axis on image 154/4 has a maximum SUV of 14.1, compatible with malignant involvement. There high activity in the cecum and ascending colon without definite CT correlate, maximum SUV 14.9, probably physiologic. Incidental CT findings: Aortoiliac atherosclerotic vascular disease. SKELETON: No significant abnormal hypermetabolic activity in this region. Incidental CT findings: none IMPRESSION: 1. Large cervical mass may invade into the myometrium in down into the vagina, maximum SUV 21.3. There is a malignant left external iliac node measuring 1.4 cm in short axis with maximum SUV 14.1. 2. Accentuated activity in the cecum and ascending colon is likely physiologic given that it has no CT correlate. Correlation with the Erin Kent's colon cancer screening history is recommended. If screening is not up-to-date, appropriate screening should be considered. 3.  Aortic Atherosclerosis (ICD10-I70.0). Electronically Signed   By: Van Clines M.D.   On: 04/25/2018 17:46    All questions were answered. The Erin Kent knows to call the clinic with any problems, questions or concerns. No barriers to learning was detected.  I spent 30 minutes counseling the Erin Kent face to face. The total time spent in the appointment was 40 minutes and more than 50% was on counseling and review of test results  [name], MD 05/11/2018 1:43 PM

## 2018-05-11 NOTE — Assessment & Plan Note (Signed)
She is doing well with additional nutritional supplement She has gained some weight I congratulated her effort and recommend she continues the same

## 2018-05-11 NOTE — Telephone Encounter (Signed)
Pt and caregiver arrived to clinic to inquire if metal fudicial would be a concern with upcoming port placement and chemotherapy infusion. Pt reported that fudicial had been left in at end of CT simulation with the intent of fudicial "falling out on its own". Pt and caregiver reassured by this RN that fudicial would not impair either the port placement or chemotherapy and that as it is small, may have already fallen out unnoticed. Pt and caregiver with much relief. Pt and caregiver verbalized understanding and agreement. Loma Sousa, RN BSN

## 2018-05-11 NOTE — Assessment & Plan Note (Signed)
She has confirmed iron deficiency anemia I recommend intravenous iron replacement therapy The most likely cause of her anemia is due to chronic blood loss/malabsorption syndrome. We discussed some of the risks, benefits, and alternatives of intravenous iron infusions. The patient is symptomatic from anemia and the iron level is critically low. She tolerated oral iron supplement poorly and desires to achieved higher levels of iron faster for adequate hematopoesis. Some of the side-effects to be expected including risks of infusion reactions, phlebitis, headaches, nausea and fatigue.  The patient is willing to proceed. Patient education material was dispensed.  Goal is to keep ferritin level greater than 50 and normalization of blood count

## 2018-05-11 NOTE — Assessment & Plan Note (Signed)
We discussed the role of concurrent chemoradiation therapy We will start treatment this week  We discussed the role of chemotherapy. The intent is of curative intent.  We discussed some of the risks, benefits, side-effects of cisplatin and its role as chemo sensitizing agent. The plan for weekly cisplatin for x5 doses along with radiation treatment.  Some of the short term side-effects included, though not limited to, including weight loss, life threatening infections, risk of allergic reactions, need for transfusions of blood products, nausea, vomiting, change in bowel habits, loss of hair, admission to hospital for various reasons, and risks of death.   Long term side-effects are also discussed including risks of infertility, permanent damage to nerve function, hearing loss, chronic fatigue, kidney damage with possibility needing hemodialysis, and rare secondary malignancy including bone marrow disorders.  The patient is aware that the response rates discussed earlier is not guaranteed.  After a long discussion, patient made an informed decision to proceed with the prescribed plan of care.   Patient education material was dispensed. I will see her on a weekly basis for toxicity review

## 2018-05-12 ENCOUNTER — Ambulatory Visit (HOSPITAL_COMMUNITY)
Admission: RE | Admit: 2018-05-12 | Discharge: 2018-05-12 | Disposition: A | Discharge status: Home / Self Care | Payer: Medicaid Other | Source: Ambulatory Visit | Attending: Radiation Oncology | Admitting: Radiation Oncology

## 2018-05-12 ENCOUNTER — Other Ambulatory Visit: Payer: Self-pay

## 2018-05-12 ENCOUNTER — Encounter (HOSPITAL_COMMUNITY): Payer: Self-pay

## 2018-05-12 ENCOUNTER — Ambulatory Visit (HOSPITAL_COMMUNITY)
Admission: RE | Admit: 2018-05-12 | Discharge: 2018-05-12 | Disposition: A | Discharge status: Home / Self Care | Payer: Medicaid Other | Source: Ambulatory Visit | Attending: Hematology and Oncology | Admitting: Hematology and Oncology

## 2018-05-12 DIAGNOSIS — N950 Postmenopausal bleeding: Secondary | ICD-10-CM | POA: Insufficient documentation

## 2018-05-12 DIAGNOSIS — C539 Malignant neoplasm of cervix uteri, unspecified: Secondary | ICD-10-CM | POA: Insufficient documentation

## 2018-05-12 DIAGNOSIS — Z510 Encounter for antineoplastic radiation therapy: Secondary | ICD-10-CM | POA: Diagnosis not present

## 2018-05-12 HISTORY — PX: IR IMAGING GUIDED PORT INSERTION: IMG5740

## 2018-05-12 LAB — CBC WITH DIFFERENTIAL/PLATELET
BASOS ABS: 0 10*3/uL (ref 0.0–0.1)
BASOS PCT: 0 %
EOS ABS: 0.1 10*3/uL (ref 0.0–0.7)
Eosinophils Relative: 2 %
HEMATOCRIT: 30.3 % — AB (ref 36.0–46.0)
Hemoglobin: 9.1 g/dL — ABNORMAL LOW (ref 12.0–15.0)
Lymphocytes Relative: 30 %
Lymphs Abs: 2.7 10*3/uL (ref 0.7–4.0)
MCH: 23.8 pg — ABNORMAL LOW (ref 26.0–34.0)
MCHC: 30 g/dL (ref 30.0–36.0)
MCV: 79.1 fL (ref 78.0–100.0)
MONO ABS: 0.6 10*3/uL (ref 0.1–1.0)
Monocytes Relative: 6 %
NEUTROS ABS: 5.5 10*3/uL (ref 1.7–7.7)
Neutrophils Relative %: 62 %
PLATELETS: 279 10*3/uL (ref 150–400)
RBC: 3.83 MIL/uL — ABNORMAL LOW (ref 3.87–5.11)
RDW: 16.6 % — AB (ref 11.5–15.5)
WBC: 8.9 10*3/uL (ref 4.0–10.5)

## 2018-05-12 LAB — PROTIME-INR
INR: 0.88
PROTHROMBIN TIME: 11.9 s (ref 11.4–15.2)

## 2018-05-12 MED ORDER — SODIUM CHLORIDE 0.9 % IV SOLN
INTRAVENOUS | Status: DC
  Administered 2018-05-12: 12:00:00 via INTRAVENOUS

## 2018-05-12 MED ORDER — LIDOCAINE HCL 1 % IJ SOLN
INTRAMUSCULAR | Status: AC | PRN
  Administered 2018-05-12: 20 mL

## 2018-05-12 MED ORDER — CEFAZOLIN SODIUM-DEXTROSE 2-4 GM/100ML-% IV SOLN
INTRAVENOUS | Status: AC
  Administered 2018-05-12: 2 g via INTRAVENOUS
  Filled 2018-05-12: qty 100

## 2018-05-12 MED ORDER — HEPARIN SOD (PORK) LOCK FLUSH 100 UNIT/ML IV SOLN
INTRAVENOUS | Status: AC | PRN
  Administered 2018-05-12: 500 [IU] via INTRAVENOUS

## 2018-05-12 MED ORDER — CEFAZOLIN SODIUM-DEXTROSE 2-4 GM/100ML-% IV SOLN
2.0000 g | INTRAVENOUS | Status: AC
  Administered 2018-05-12: 2 g via INTRAVENOUS

## 2018-05-12 MED ORDER — LIDOCAINE HCL 1 % IJ SOLN
INTRAMUSCULAR | Status: AC
  Filled 2018-05-12: qty 20

## 2018-05-12 MED ORDER — HEPARIN SOD (PORK) LOCK FLUSH 100 UNIT/ML IV SOLN
INTRAVENOUS | Status: AC
  Filled 2018-05-12: qty 5

## 2018-05-12 MED ORDER — FENTANYL CITRATE (PF) 100 MCG/2ML IJ SOLN
INTRAMUSCULAR | Status: AC | PRN
  Administered 2018-05-12 (×2): 50 ug via INTRAVENOUS

## 2018-05-12 MED ORDER — MIDAZOLAM HCL 2 MG/2ML IJ SOLN
INTRAMUSCULAR | Status: AC
  Filled 2018-05-12: qty 4

## 2018-05-12 MED ORDER — FENTANYL CITRATE (PF) 100 MCG/2ML IJ SOLN
INTRAMUSCULAR | Status: AC
  Filled 2018-05-12: qty 2

## 2018-05-12 MED ORDER — MIDAZOLAM HCL 2 MG/2ML IJ SOLN
INTRAMUSCULAR | Status: AC | PRN
  Administered 2018-05-12: 0.5 mg via INTRAVENOUS
  Administered 2018-05-12: 1 mg via INTRAVENOUS
  Administered 2018-05-12: 0.5 mg via INTRAVENOUS

## 2018-05-12 NOTE — Discharge Instructions (Signed)
Don't use EMLA cream until port has healed. EMLA cream will dissolve the skin glue     Implanted [facility] Guide An implanted port is a type of central line that is placed under the skin. Central lines are used to provide IV access when treatment or nutrition needs to be given through a persons veins. Implanted ports are used for long-term IV access. An implanted port may be placed because:  You need IV medicine that would be irritating to the small veins in your hands or arms.  You need long-term IV medicines, such as antibiotics.  You need IV nutrition for a long period.  You need frequent blood draws for lab tests.  You need dialysis.  Implanted ports are usually placed in the chest area, but they can also be placed in the upper arm, the abdomen, or the leg. An implanted port has two main parts:  Reservoir. The reservoir is round and will appear as a small, raised area under your skin. The reservoir is the part where a needle is inserted to give medicines or draw blood.  Catheter. The catheter is a thin, flexible tube that extends from the reservoir. The catheter is placed into a large vein. Medicine that is inserted into the reservoir goes into the catheter and then into the vein.  How will I care for my incision site? Do not get the incision site wet. Bathe or shower as directed by your health care provider. How is my port accessed? Special steps must be taken to access the port:  Before the port is accessed, a numbing cream can be placed on the skin. This helps numb the skin over the port site.  Your health care provider uses a sterile technique to access the port. ? Your health care provider must put on a mask and sterile gloves. ? The skin over your port is cleaned carefully with an antiseptic and allowed to dry. ? The port is gently pinched between sterile gloves, and a needle is inserted into the port.  Only "non-coring" port needles should be used to access the port.  Once the port is accessed, a blood return should be checked. This helps ensure that the port is in the vein and is not clogged.  If your port needs to remain accessed for a constant infusion, a clear (transparent) bandage will be placed over the needle site. The bandage and needle will need to be changed every week, or as directed by your health care provider.  Keep the bandage covering the needle clean and dry. Do not get it wet. Follow your health care providers instructions on how to take a shower or bath while the port is accessed.  If your port does not need to stay accessed, no bandage is needed over the port.  What is flushing? Flushing helps keep the port from getting clogged. Follow your health care providers instructions on how and when to flush the port. Ports are usually flushed with saline solution or a medicine called heparin. The need for flushing will depend on how the port is used.  If the port is used for intermittent medicines or blood draws, the port will need to be flushed: ? After medicines have been given. ? After blood has been drawn. ? As part of routine maintenance.  If a constant infusion is running, the port may not need to be flushed.  How long will my port stay implanted? The port can stay in for as long as your health  care provider thinks it is needed. When it is time for the port to come out, surgery will be done to remove it. The procedure is similar to the one performed when the port was put in. When should I seek immediate medical care? When you have an implanted port, you should seek immediate medical care if:  You notice a bad smell coming from the incision site.  You have swelling, redness, or drainage at the incision site.  You have more swelling or pain at the port site or the surrounding area.  You have a fever that is not controlled with medicine.  This information is not intended to replace advice given to you by your health care provider.  Make sure you discuss any questions you have with your health care provider. Document Released: 08/31/2005 Document Revised: 02/06/2016 Document Reviewed: 05/08/2013 Elsevier Interactive Patient Education  2017 [place].     Moderate Conscious Sedation, Adult, Care After These instructions provide you with information about caring for yourself after your procedure. Your health care provider may also give you more specific instructions. Your treatment has been planned according to current medical practices, but problems sometimes occur. Call your health care provider if you have any problems or questions after your procedure. What can I expect after the procedure? After your procedure, it is common:  To feel sleepy for several hours.  To feel clumsy and have poor balance for several hours.  To have poor judgment for several hours.  To vomit if you eat too soon.  Follow these instructions at home: For at least 24 hours after the procedure:   Do not: ? Participate in activities where you could fall or become injured. ? Drive. ? Use heavy machinery. ? Drink alcohol. ? Take sleeping pills or medicines that cause drowsiness. ? Make important decisions or sign legal documents. ? Take care of children on your own.  Rest. Eating and drinking  Follow the diet recommended by your health care provider.  If you vomit: ? Drink water, juice, or soup when you can drink without vomiting. ? Make sure you have little or no nausea before eating solid foods. General instructions  Have a responsible adult stay with you until you are awake and alert.  Take over-the-counter and prescription medicines only as told by your health care provider.  If you smoke, do not smoke without supervision.  Keep all follow-up visits as told by your health care provider. This is important. Contact a health care provider if:  You keep feeling nauseous or you keep vomiting.  You feel  light-headed.  You develop a rash.  You have a fever. Get help right away if:  You have trouble breathing. This information is not intended to replace advice given to you by your health care provider. Make sure you discuss any questions you have with your health care provider. Document Released: 06/21/2013 Document Revised: 02/03/2016 Document Reviewed: 12/21/2015 Elsevier Interactive Patient Education  [employer].

## 2018-05-12 NOTE — Progress Notes (Signed)
Dr. Barbie Banner notified that pt had water to drink intermittently from midnight until 8am. States we are ok to proceed on time.

## 2018-05-12 NOTE — H&P (Signed)
Chief Complaint: Patient was seen in consultation today for malignant neoplasm of the cervix.   Referring Physician(s): [name]  Supervising Physician: Marybelle Killings  Patient Status: [facility] - Out-pt  History of Present Illness: Erin Kent is a 67 y.o. female with a past medical history of iron deficiency anemia and cervical cancer. A few months ago, she presented with postmenopausal bleeding. She underwent a cervical biopsy 04/12/2018 which revealed invasive squamous cell carcinoma. She is planning on starting chemoradiation therapy in the next few weeks.  IR requested by Dr. Alvy Bimler for possible image-guided Port-a-cath insertion. Patient awake and alert laying in bed with no complaints at this time. Accompanied by caregiver/friend. Denies fever, chills, chest pain, dyspnea, abdominal pain, dizziness, or headache.   Past Medical History:  Diagnosis Date  . Cervical cancer ([place])   . Iron deficiency anemia     Past Surgical History:  Procedure Laterality Date  . CYSTOSCOPY N/A 04/28/2018   Procedure: CYSTOSCOPY;  Surgeon: Isabel Caprice, MD;  Location: [facility];  Service: Gynecology;  Laterality: N/A;  . EUA/ PAPSMEAR/  CERVICAL MASS BIOPSY  04-12-2018   dr peggy constant @[facility]   . WISDOM TOOTH EXTRACTION      Allergies: Patient has no known allergies.  Medications: Prior to Admission medications   Medication Sig Start Date End Date Taking? Authorizing Provider  acetaminophen (TYLENOL) 325 MG tablet Take 650 mg by mouth every 6 (six) hours as needed.    [provider]  ferrous sulfate (FERROUSUL) 325 (65 FE) MG tablet Take 1 tablet (325 mg total) by mouth 2 (two) times daily. 04/12/18   Constant, Peggy, MD  ibuprofen (ADVIL,MOTRIN) 600 MG tablet Take 1 tablet (600 mg total) by mouth every 6 (six) hours as needed. 04/12/18   Constant, Peggy, MD  lidocaine-prilocaine (EMLA) cream Apply to affected area once 05/10/18   [name], MD  Multiple  Vitamins-Minerals (CENTRUM SILVER ULTRA WOMENS PO) Take 1 tablet by mouth daily.    [provider]  ondansetron (ZOFRAN) 8 MG tablet Take 1 tablet (8 mg total) by mouth every 8 (eight) hours as needed. Start on the third day after chemotherapy. 05/10/18   [name], MD  oxyCODONE-acetaminophen (PERCOCET/ROXICET) 5-325 MG tablet Take 1-2 tablets by mouth every 6 (six) hours as needed. Patient not taking: Reported on 05/04/2018 04/12/18   Constant, Peggy, MD  prochlorperazine (COMPAZINE) 10 MG tablet Take 1 tablet (10 mg total) by mouth every 6 (six) hours as needed (Nausea or vomiting). 05/10/18   [name], MD     Family History  Problem Relation Age of Onset  . Hypertension Mother     Social History   Socioeconomic History  . Marital status: Single    Spouse name: Not on file  . Number of children: 0  . Years of education: Not on file  . Highest education level: Not on file  Occupational History  . Not on file  Social Needs  . Financial resource strain: Not on file  . Food insecurity:    Worry: Not on file    Inability: Not on file  . Transportation needs:    Medical: Not on file    Non-medical: Not on file  Tobacco Use  . Smoking status: Never Smoker  . Smokeless tobacco: Never Used  Substance and Sexual Activity  . Alcohol use: No  . Drug use: No  . Sexual activity: Not Currently    Birth control/protection: None  Lifestyle  . Physical activity:  Days per week: Not on file    Minutes per session: Not on file  . Stress: Not on file  Relationships  . Social connections:    Talks on phone: Not on file    Gets together: Not on file    Attends religious service: Not on file    Active member of club or organization: Not on file    Attends meetings of clubs or organizations: Not on file    Relationship status: Not on file  Other Topics Concern  . Not on file  Social History Narrative  . Not on file     Review of Systems: A 12 point ROS discussed and  pertinent positives are indicated in the HPI above.  All other systems are negative.  Review of Systems  Constitutional: Negative for chills and fever.  Respiratory: Negative for shortness of breath and wheezing.   Cardiovascular: Negative for chest pain and palpitations.  Gastrointestinal: Negative for abdominal pain.  Neurological: Negative for dizziness and headaches.  Psychiatric/Behavioral: Negative for behavioral problems and confusion.    Vital Signs: BP 122/70 (BP Location: Right Arm)   Pulse 75   Temp 97.8 F (36.6 C) (Oral)   Resp 18   Ht 5\' 1"  (1.549 m)   Wt 85 lb (38.6 kg)   SpO2 97%   BMI 16.06 kg/m   Physical Exam  Constitutional: She is oriented to person, place, and time. She appears well-developed and well-nourished. No distress.  Cardiovascular: Normal rate, regular rhythm and normal heart sounds.  No murmur heard. Pulmonary/Chest: Effort normal and breath sounds normal. No respiratory distress. She has no wheezes.  Neurological: She is alert and oriented to person, place, and time.  Skin: Skin is warm and dry.  Psychiatric: She has a normal mood and affect. Her behavior is normal. Judgment and thought content normal.  Nursing note and vitals reviewed.    MD Evaluation Airway: WNL Heart: WNL Abdomen: WNL Chest/ Lungs: WNL ASA  Classification: 3 Mallampati/Airway Score: One   Imaging: Nm Pet Image Initial (pi) Skull Base To Thigh  Result Date: 04/25/2018 CLINICAL DATA:  Initial treatment strategy for cervical cancer. EXAM: NUCLEAR MEDICINE PET SKULL BASE TO THIGH TECHNIQUE: 5.0 mCi F-18 FDG was injected intravenously. Full-ring PET imaging was performed from the skull base to thigh after the radiotracer. CT data was obtained and used for attenuation correction and anatomic localization. Fasting blood glucose: 93 mg/dl COMPARISON:  1.9 CT from 01/25/2012 and pelvic ultrasound from 03/13/2018 FINDINGS: Mediastinal blood pool activity: SUV max 2.1 NECK:  Subtle asymmetry of the tonsillar tissues, with the maximum SUV on the right and 4.5 and on the left at 2.9. This is most likely to be incidental given the overall clinical scenario. Incidental CT findings: none CHEST: No significant abnormal hypermetabolic activity in this region. Incidental CT findings: Mild descending aortic atherosclerotic calcification. Minimal biapical pleuroparenchymal scarring. Linear subsegmental atelectasis or scarring in the left lower lobe. ABDOMEN/PELVIS: Prominent hypermetabolic activity associated with the cervical mass which appears to extend down towards vagina and vaginal vestibule, and possibly invade up into the myometrium. The upper portion of the mass has a maximum SUV of 21.3 and the lower portion 21.1. A left external iliac node measuring 1.4 cm in short axis on image 154/4 has a maximum SUV of 14.1, compatible with malignant involvement. There high activity in the cecum and ascending colon without definite CT correlate, maximum SUV 14.9, probably physiologic. Incidental CT findings: Aortoiliac atherosclerotic vascular disease. SKELETON: No significant  abnormal hypermetabolic activity in this region. Incidental CT findings: none IMPRESSION: 1. Large cervical mass may invade into the myometrium in down into the vagina, maximum SUV 21.3. There is a malignant left external iliac node measuring 1.4 cm in short axis with maximum SUV 14.1. 2. Accentuated activity in the cecum and ascending colon is likely physiologic given that it has no CT correlate. Correlation with the patient's colon cancer screening history is recommended. If screening is not up-to-date, appropriate screening should be considered. 3.  Aortic Atherosclerosis (ICD10-I70.0). Electronically Signed   By: Van Clines M.D.   On: 04/25/2018 17:46    Labs:  CBC: Recent Labs    03/13/18 1125 04/12/18 0945 05/10/18 0842 05/12/18 1130  WBC 11.5* 8.1 9.3 8.9  HGB 10.3* 8.8* 9.0* 9.1*  HCT 32.7* 27.8*  30.8* 30.3*  PLT 317 311 261 279    COAGS: Recent Labs    03/13/18 1125 05/12/18 1130  INR 0.91 0.88    BMP: Recent Labs    03/13/18 1125 05/10/18 0842  NA 141 139  K 3.4* 4.6  CL 105 104  CO2 28 29  GLUCOSE 121* 97  BUN 9 17  CALCIUM 9.2 9.1  CREATININE 0.71 0.75  GFRNONAA >60 >60  GFRAA >60 >60    LIVER FUNCTION TESTS: Recent Labs    03/13/18 1125 05/10/18 0842  BILITOT 0.6 <0.2*  AST 19 10*  ALT 8 7  ALKPHOS 57 78  PROT 7.4 7.2  ALBUMIN 3.8 3.4*    TUMOR MARKERS: No results for input(s): AFPTM, CEA, CA199, CHROMGRNA in the last 8760 hours.  Assessment and Plan:  Malignant neoplasm of the cervix. Plan for image-guided Port-a-cath insertion today with Dr. Barbie Banner. Patient is NPO. Denies fever and WBCs WNL. She does not take blood thinners. INR 0.88 seconds today.  Risks and benefits of image guided port-a-catheter placement was discussed with the patient including, but not limited to bleeding, infection, pneumothorax, or fibrin sheath development and need for additional procedures. All of the patient's questions were answered, patient is agreeable to proceed. Consent signed and in chart.   Thank you for this interesting consult.  I greatly enjoyed meeting Erin Kent and look forward to participating in their care.  A copy of this report was sent to the requesting provider on this date.  Electronically Signed: Earley Abide, PA-C 05/12/2018, 12:30 PM   I spent a total of 15 Minutes in face to face in clinical consultation, greater than 50% of which was counseling/coordinating care for malignant neoplasm of cervix.

## 2018-05-12 NOTE — Procedures (Signed)
RIJV PAC SVC RA EBL 0 Comp 0 

## 2018-05-13 ENCOUNTER — Inpatient Hospital Stay: Payer: Medicaid Other

## 2018-05-13 VITALS — BP 126/65 | HR 75 | Temp 97.8°F | Resp 17 | Wt 86.8 lb

## 2018-05-13 DIAGNOSIS — C539 Malignant neoplasm of cervix uteri, unspecified: Secondary | ICD-10-CM

## 2018-05-13 MED ORDER — SODIUM CHLORIDE 0.9 % IV SOLN
Freq: Once | INTRAVENOUS | Status: AC
  Administered 2018-05-13: 12:00:00 via INTRAVENOUS
  Filled 2018-05-13: qty 5

## 2018-05-13 MED ORDER — SODIUM CHLORIDE 0.9 % IV SOLN
40.0000 mg/m2 | Freq: Once | INTRAVENOUS | Status: AC
  Administered 2018-05-13: 51 mg via INTRAVENOUS
  Filled 2018-05-13: qty 51

## 2018-05-13 MED ORDER — PALONOSETRON HCL INJECTION 0.25 MG/5ML
INTRAVENOUS | Status: AC
  Filled 2018-05-13: qty 5

## 2018-05-13 MED ORDER — SODIUM CHLORIDE 0.9 % IV SOLN
510.0000 mg | Freq: Once | INTRAVENOUS | Status: AC
  Administered 2018-05-13: 510 mg via INTRAVENOUS
  Filled 2018-05-13: qty 17

## 2018-05-13 MED ORDER — SODIUM CHLORIDE 0.9% FLUSH
10.0000 mL | INTRAVENOUS | Status: DC | PRN
  Administered 2018-05-13: 10 mL
  Filled 2018-05-13: qty 10

## 2018-05-13 MED ORDER — HEPARIN SOD (PORK) LOCK FLUSH 100 UNIT/ML IV SOLN
500.0000 [IU] | Freq: Once | INTRAVENOUS | Status: AC | PRN
  Administered 2018-05-13: 500 [IU]
  Filled 2018-05-13: qty 5

## 2018-05-13 MED ORDER — PALONOSETRON HCL INJECTION 0.25 MG/5ML
0.2500 mg | Freq: Once | INTRAVENOUS | Status: AC
  Administered 2018-05-13: 0.25 mg via INTRAVENOUS

## 2018-05-13 MED ORDER — POTASSIUM CHLORIDE 2 MEQ/ML IV SOLN
Freq: Once | INTRAVENOUS | Status: AC
  Administered 2018-05-13: 10:00:00 via INTRAVENOUS
  Filled 2018-05-13: qty 10

## 2018-05-13 MED ORDER — SODIUM CHLORIDE 0.9 % IV SOLN
Freq: Once | INTRAVENOUS | Status: AC
  Administered 2018-05-13: 09:00:00 via INTRAVENOUS
  Filled 2018-05-13: qty 250

## 2018-05-13 NOTE — Patient Instructions (Signed)
[facility] Discharge Instructions for Patients Receiving Chemotherapy  Today you received the following chemotherapy agents Cisplatin  To help prevent nausea and vomiting after your treatment, we encourage you to take your nausea medication as prescribed.   If you develop nausea and vomiting that is not controlled by your nausea medication, call the clinic.   BELOW ARE SYMPTOMS THAT SHOULD BE REPORTED IMMEDIATELY:  *FEVER GREATER THAN 100.5 F  *CHILLS WITH OR WITHOUT FEVER  NAUSEA AND VOMITING THAT IS NOT CONTROLLED WITH YOUR NAUSEA MEDICATION  *UNUSUAL SHORTNESS OF BREATH  *UNUSUAL BRUISING OR BLEEDING  TENDERNESS IN MOUTH AND THROAT WITH OR WITHOUT PRESENCE OF ULCERS  *URINARY PROBLEMS  *BOWEL PROBLEMS  UNUSUAL RASH Items with * indicate a potential emergency and should be followed up as soon as possible.  Feel free to call the clinic should you have any questions or concerns. The clinic phone number is [phone].  Please show the CHEMO ALERT CARD at check-in to the Emergency Department and triage nurse.  Cisplatin injection What is this medicine? CISPLATIN (SIS pla tin) is a chemotherapy drug. It targets fast dividing cells, like cancer cells, and causes these cells to die. This medicine is used to treat many types of cancer like bladder, ovarian, and testicular cancers. This medicine may be used for other purposes; ask your health care provider or pharmacist if you have questions. COMMON BRAND NAME(S): Platinol, Platinol -AQ What should I tell my health care provider before I take this medicine? They need to know if you have any of these conditions: -blood disorders -hearing problems -kidney disease -recent or ongoing radiation therapy -an unusual or allergic reaction to cisplatin, carboplatin, other chemotherapy, other medicines, foods, dyes, or preservatives -pregnant or trying to get pregnant -breast-feeding How should I use this  medicine? This drug is given as an infusion into a vein. It is administered in a hospital or clinic by a specially trained health care professional. Talk to your pediatrician regarding the use of this medicine in children. Special care may be needed. Overdosage: If you think you have taken too much of this medicine contact a poison control center or emergency room at once. NOTE: This medicine is only for you. Do not share this medicine with others. What if I miss a dose? It is important not to miss a dose. Call your doctor or health care professional if you are unable to keep an appointment. What may interact with this medicine? -dofetilide -foscarnet -medicines for seizures -medicines to increase blood counts like filgrastim, pegfilgrastim, sargramostim -probenecid -pyridoxine used with altretamine -rituximab -some antibiotics like amikacin, gentamicin, neomycin, polymyxin B, streptomycin, tobramycin -sulfinpyrazone -vaccines -zalcitabine Talk to your doctor or health care professional before taking any of these medicines: -acetaminophen -aspirin -ibuprofen -ketoprofen -naproxen This list may not describe all possible interactions. Give your health care provider a list of all the medicines, herbs, non-prescription drugs, or dietary supplements you use. Also tell them if you smoke, drink alcohol, or use illegal drugs. Some items may interact with your medicine. What should I watch for while using this medicine? Your condition will be monitored carefully while you are receiving this medicine. You will need important blood work done while you are taking this medicine. This drug may make you feel generally unwell. This is not uncommon, as chemotherapy can affect healthy cells as well as cancer cells. Report any side effects. Continue your course of treatment even though you feel ill unless your doctor tells you to stop.   In some cases, you may be given additional medicines to help with side  effects. Follow all directions for their use. Call your doctor or health care professional for advice if you get a fever, chills or sore throat, or other symptoms of a cold or flu. Do not treat yourself. This drug decreases your body's ability to fight infections. Try to avoid being around people who are sick. This medicine may increase your risk to bruise or bleed. Call your doctor or health care professional if you notice any unusual bleeding. Be careful brushing and flossing your teeth or using a toothpick because you may get an infection or bleed more easily. If you have any dental work done, tell your dentist you are receiving this medicine. Avoid taking products that contain aspirin, acetaminophen, ibuprofen, naproxen, or ketoprofen unless instructed by your doctor. These medicines may hide a fever. Do not become pregnant while taking this medicine. Women should inform their doctor if they wish to become pregnant or think they might be pregnant. There is a potential for serious side effects to an unborn child. Talk to your health care professional or pharmacist for more information. Do not breast-feed an infant while taking this medicine. Drink fluids as directed while you are taking this medicine. This will help protect your kidneys. Call your doctor or health care professional if you get diarrhea. Do not treat yourself. What side effects may I notice from receiving this medicine? Side effects that you should report to your doctor or health care professional as soon as possible: -allergic reactions like skin rash, itching or hives, swelling of the face, lips, or tongue -signs of infection - fever or chills, cough, sore throat, pain or difficulty passing urine -signs of decreased platelets or bleeding - bruising, pinpoint red spots on the skin, black, tarry stools, nosebleeds -signs of decreased red blood cells - unusually weak or tired, fainting spells, lightheadedness -breathing  problems -changes in hearing -gout pain -low blood counts - This drug may decrease the number of white blood cells, red blood cells and platelets. You may be at increased risk for infections and bleeding. -nausea and vomiting -pain, swelling, redness or irritation at the injection site -pain, tingling, numbness in the hands or feet -problems with balance, movement -trouble passing urine or change in the amount of urine Side effects that usually do not require medical attention (report to your doctor or health care professional if they continue or are bothersome): -changes in vision -loss of appetite -metallic taste in the mouth or changes in taste This list may not describe all possible side effects. Call your doctor for medical advice about side effects. You may report side effects to FDA at 1-800-FDA-1088. Where should I keep my medicine? This drug is given in a hospital or clinic and will not be stored at home. NOTE: This sheet is a summary. It may not cover all possible information. If you have questions about this medicine, talk to your doctor, pharmacist, or health care provider.  2018 Elsevier/Gold Standard (2007-12-06 14:40:54)  

## 2018-05-17 ENCOUNTER — Encounter: Payer: Self-pay | Admitting: Oncology

## 2018-05-17 ENCOUNTER — Ambulatory Visit
Admission: RE | Admit: 2018-05-17 | Discharge: 2018-05-17 | Disposition: A | Discharge status: Home / Self Care | Payer: Medicaid Other | Source: Ambulatory Visit | Attending: Radiation Oncology | Admitting: Radiation Oncology

## 2018-05-17 DIAGNOSIS — C539 Malignant neoplasm of cervix uteri, unspecified: Secondary | ICD-10-CM | POA: Insufficient documentation

## 2018-05-17 DIAGNOSIS — Z510 Encounter for antineoplastic radiation therapy: Secondary | ICD-10-CM | POA: Diagnosis present

## 2018-05-17 NOTE — Progress Notes (Signed)
  Radiation Oncology         [phone] ________________________________  Name: Erin Kent MRN: [id number]  Date: 05/17/2018  DOB: July 14, 1951  Simulation Verification Note    ICD-10-CM   1. Malignant neoplasm of cervix, unspecified site [facility]) C53.9     Status: outpatient  NARRATIVE: The patient was brought to the treatment unit and placed in the planned treatment position. The clinical setup was verified. Then port films were obtained and uploaded to the radiation oncology medical record software.  The treatment beams were carefully compared against the planned radiation fields. The position location and shape of the radiation fields was reviewed. They targeted volume of tissue appears to be appropriately covered by the radiation beams. Organs at risk appear to be excluded as planned.  Based on my personal review, I approved the simulation verification. The patient's treatment will proceed as planned.  -----------------------------------  Blair Promise, PhD, MD

## 2018-05-18 ENCOUNTER — Inpatient Hospital Stay: Payer: Medicaid Other | Attending: Obstetrics

## 2018-05-18 ENCOUNTER — Ambulatory Visit
Admission: RE | Admit: 2018-05-18 | Discharge: 2018-05-18 | Disposition: A | Discharge status: Home / Self Care | Payer: Medicaid Other | Source: Ambulatory Visit | Attending: Radiation Oncology | Admitting: Radiation Oncology

## 2018-05-18 ENCOUNTER — Telehealth: Payer: Self-pay | Admitting: *Deleted

## 2018-05-18 ENCOUNTER — Ambulatory Visit (HOSPITAL_COMMUNITY): Payer: Medicaid Other

## 2018-05-18 DIAGNOSIS — I700 Atherosclerosis of aorta: Secondary | ICD-10-CM | POA: Insufficient documentation

## 2018-05-18 DIAGNOSIS — R6881 Early satiety: Secondary | ICD-10-CM | POA: Insufficient documentation

## 2018-05-18 DIAGNOSIS — C539 Malignant neoplasm of cervix uteri, unspecified: Secondary | ICD-10-CM | POA: Diagnosis not present

## 2018-05-18 DIAGNOSIS — D509 Iron deficiency anemia, unspecified: Secondary | ICD-10-CM | POA: Insufficient documentation

## 2018-05-18 DIAGNOSIS — F321 Major depressive disorder, single episode, moderate: Secondary | ICD-10-CM | POA: Insufficient documentation

## 2018-05-18 DIAGNOSIS — D61818 Other pancytopenia: Secondary | ICD-10-CM | POA: Insufficient documentation

## 2018-05-18 DIAGNOSIS — R400 Somnolence: Secondary | ICD-10-CM | POA: Insufficient documentation

## 2018-05-18 DIAGNOSIS — Z79899 Other long term (current) drug therapy: Secondary | ICD-10-CM | POA: Diagnosis not present

## 2018-05-18 DIAGNOSIS — E860 Dehydration: Secondary | ICD-10-CM | POA: Insufficient documentation

## 2018-05-18 DIAGNOSIS — R197 Diarrhea, unspecified: Secondary | ICD-10-CM | POA: Diagnosis not present

## 2018-05-18 DIAGNOSIS — Z510 Encounter for antineoplastic radiation therapy: Secondary | ICD-10-CM | POA: Diagnosis not present

## 2018-05-18 DIAGNOSIS — K5900 Constipation, unspecified: Secondary | ICD-10-CM | POA: Insufficient documentation

## 2018-05-18 DIAGNOSIS — R64 Cachexia: Secondary | ICD-10-CM | POA: Insufficient documentation

## 2018-05-18 DIAGNOSIS — M810 Age-related osteoporosis without current pathological fracture: Secondary | ICD-10-CM | POA: Diagnosis not present

## 2018-05-18 DIAGNOSIS — E876 Hypokalemia: Secondary | ICD-10-CM | POA: Diagnosis not present

## 2018-05-18 DIAGNOSIS — Z5111 Encounter for antineoplastic chemotherapy: Secondary | ICD-10-CM | POA: Diagnosis present

## 2018-05-18 LAB — CBC WITH DIFFERENTIAL/PLATELET
Basophils Absolute: 0 10*3/uL (ref 0.0–0.1)
Basophils Relative: 0 %
EOS PCT: 1 %
Eosinophils Absolute: 0.1 10*3/uL (ref 0.0–0.5)
HEMATOCRIT: 31.3 % — AB (ref 34.8–46.6)
Hemoglobin: 9.4 g/dL — ABNORMAL LOW (ref 11.6–15.9)
LYMPHS ABS: 1.9 10*3/uL (ref 0.9–3.3)
Lymphocytes Relative: 22 %
MCH: 23.5 pg — AB (ref 25.1–34.0)
MCHC: 30 g/dL — ABNORMAL LOW (ref 31.5–36.0)
MCV: 78.3 fL — ABNORMAL LOW (ref 79.5–101.0)
Monocytes Absolute: 0.8 10*3/uL (ref 0.1–0.9)
Monocytes Relative: 10 %
NEUTROS ABS: 5.8 10*3/uL (ref 1.5–6.5)
Neutrophils Relative %: 67 %
PLATELETS: 249 10*3/uL (ref 145–400)
RBC: 4 MIL/uL (ref 3.70–5.45)
RDW: 16.7 % — ABNORMAL HIGH (ref 11.2–14.5)
WBC: 8.6 10*3/uL (ref 3.9–10.3)

## 2018-05-18 LAB — COMPREHENSIVE METABOLIC PANEL
ALT: 27 U/L (ref 0–44)
AST: 11 U/L — ABNORMAL LOW (ref 15–41)
Albumin: 3.5 g/dL (ref 3.5–5.0)
Alkaline Phosphatase: 83 U/L (ref 38–126)
Anion gap: 9 (ref 5–15)
BUN: 24 mg/dL — ABNORMAL HIGH (ref 8–23)
CO2: 28 mmol/L (ref 22–32)
Calcium: 9.3 mg/dL (ref 8.9–10.3)
Chloride: 102 mmol/L (ref 98–111)
Creatinine, Ser: 0.78 mg/dL (ref 0.44–1.00)
GFR calc Af Amer: >60 mL/min (ref >60)
GFR calc non Af Amer: >60 mL/min (ref >60)
Glucose, Bld: 114 mg/dL — ABNORMAL HIGH (ref 70–99)
Potassium: 4.3 mmol/L (ref 3.5–5.1)
Sodium: 139 mmol/L (ref 135–145)
Total Bilirubin: <0.2 mg/dL — ABNORMAL LOW (ref 0.3–1.2)
Total Protein: 7.2 g/dL (ref 6.5–8.1)

## 2018-05-18 LAB — MAGNESIUM: Magnesium: 2.2 mg/dL (ref 1.7–2.4)

## 2018-05-18 NOTE — Telephone Encounter (Signed)
-----   Message from Thomasene Lot, RN sent at 05/13/2018  4:20 PM EDT ----- Regarding: Dr Alvy Bimler 1st tx f/u call 1st tx f/u call

## 2018-05-18 NOTE — Telephone Encounter (Signed)
Called & left message for pt to return call to discuss how she did with her chemotherapy.

## 2018-05-18 NOTE — Telephone Encounter (Signed)
Pt returned call & states she did well, no n/v, no diarrhea, but bowels a little sluggish.  Discussed increased fluids & roughage foods.

## 2018-05-19 ENCOUNTER — Ambulatory Visit
Admission: RE | Admit: 2018-05-19 | Discharge: 2018-05-19 | Disposition: A | Discharge status: Home / Self Care | Payer: Medicaid Other | Source: Ambulatory Visit | Attending: Radiation Oncology | Admitting: Radiation Oncology

## 2018-05-19 ENCOUNTER — Telehealth: Payer: Self-pay

## 2018-05-19 ENCOUNTER — Inpatient Hospital Stay (HOSPITAL_BASED_OUTPATIENT_CLINIC_OR_DEPARTMENT_OTHER): Payer: Medicaid Other | Admitting: Hematology and Oncology

## 2018-05-19 ENCOUNTER — Encounter: Payer: Self-pay | Admitting: Hematology and Oncology

## 2018-05-19 DIAGNOSIS — Z79899 Other long term (current) drug therapy: Secondary | ICD-10-CM | POA: Diagnosis not present

## 2018-05-19 DIAGNOSIS — K5900 Constipation, unspecified: Secondary | ICD-10-CM

## 2018-05-19 DIAGNOSIS — D500 Iron deficiency anemia secondary to blood loss (chronic): Secondary | ICD-10-CM

## 2018-05-19 DIAGNOSIS — Z5111 Encounter for antineoplastic chemotherapy: Secondary | ICD-10-CM

## 2018-05-19 DIAGNOSIS — R64 Cachexia: Secondary | ICD-10-CM

## 2018-05-19 DIAGNOSIS — D509 Iron deficiency anemia, unspecified: Secondary | ICD-10-CM

## 2018-05-19 DIAGNOSIS — Z510 Encounter for antineoplastic radiation therapy: Secondary | ICD-10-CM | POA: Diagnosis not present

## 2018-05-19 DIAGNOSIS — C539 Malignant neoplasm of cervix uteri, unspecified: Secondary | ICD-10-CM | POA: Diagnosis not present

## 2018-05-19 DIAGNOSIS — K5909 Other constipation: Secondary | ICD-10-CM | POA: Insufficient documentation

## 2018-05-19 DIAGNOSIS — I700 Atherosclerosis of aorta: Secondary | ICD-10-CM

## 2018-05-19 NOTE — Assessment & Plan Note (Signed)
She had recent constipation, resolved with laxative I encouraged her to continue the same

## 2018-05-19 NOTE — Telephone Encounter (Signed)
Caregiver stopped into clinic to review pt's labs with this RN. Caregiver most concerned about BUN. Caregiver states that pt is not "getting even 5 glasses of water, much less 8-10 a day". Conveyed to caregiver that this RN would follow up with pt on Tuesday during pt's undertreatment visit with Dr. Sondra Come and encourage pt to increase fluid intake. This RN conveyed to caregiver that Dr. Sondra Come would also be made aware of need for reinforcement with pt re: fluid intake. Caregiver verbalized understanding and agreement. Loma Sousa, RN BSN

## 2018-05-19 NOTE — Assessment & Plan Note (Signed)
She tolerated treatment well except for significant fatigue and mild constipation She has lost some weight due to reduced oral intake of food I will proceed with treatment tomorrow as schedule without dose adjustment and plan to see her every week for supportive care

## 2018-05-19 NOTE — Assessment & Plan Note (Signed)
She is doing well with additional nutritional supplement but lost some weight last week due to lack of motivation Encourage frequent small meals as tolerated.

## 2018-05-19 NOTE — Progress Notes (Signed)
[facility] OFFICE PROGRESS NOTE  Patient Care Team: Patient, [name] as PCP - General (General Practice)  ASSESSMENT & PLAN:  Malignant neoplasm of cervix ([place]) She tolerated treatment well except for significant fatigue and mild constipation She has lost some weight due to reduced oral intake of food I will proceed with treatment tomorrow as schedule without dose adjustment and plan to see her every week for supportive care  Iron deficiency anemia She has persistent iron deficiency anemia and has received 1 dose of intravenous iron last week We will proceed with second dose tomorrow  Cachexia ([place]) She is doing well with additional nutritional supplement but lost some weight last week due to lack of motivation Encourage frequent small meals as tolerated.  Other constipation She had recent constipation, resolved with laxative I encouraged her to continue the same   No orders of the defined types were placed in this encounter.   INTERVAL HISTORY: Please see below for problem oriented charting. She returns for further follow-up with her caregiver She received first dose of chemotherapy last week and has started radiation therapy She has lost some weight due to lack of motivation to eat frequently Denies nausea She has some mild constipation, resolved with laxatives Denies peripheral neuropathy No further vaginal bleeding  SUMMARY OF ONCOLOGIC HISTORY:   Malignant neoplasm of cervix ([place])   03/13/2018 Imaging    US pelvis There are mobile echogenic foci within the endometrial cavity suggestive of gas. This is nonspecific in etiology however may be secondary to endometritis. Correlate for history of recent instrumentation. This obscures visualization of the endometrial tissue and therefore a follow-up pelvic ultrasound in 2-4 weeks is recommended after resolution of the acute symptomatology if symptoms persist to further evaluate the endometrium.      03/13/2018 Initial Diagnosis    She initially presented with PMB    04/12/2018 Pathology Results    Cervix, biopsy, mass - INVASIVE SQUAMOUS CELL CARCINOMA - SEE COMMENT    04/12/2018 Surgery    PREOPERATIVE DIAGNOSIS:  Postmenopausal vaginal bleeding. POSTOPERATIVE DIAGNOSIS: The same PROCEDURE: Exam under anesthesia, pap smear, cervical mass biopsy SURGEON:  Dr. [name]  INDICATIONS: 67 y.o. yo [id number] with PMB here for exam under anesthesia.Risks of surgery were discussed with the patient including but not limited to: bleeding which may require transfusion; infection which may require antibiotics; injury to uterus or surrounding organs; need for additional procedures including laparotomy or laparoscopy; and other postoperative/anesthesia complications. Written informed consent was obtained.    FINDINGS:  An 8-week size midline uterus.  No adnexal mass palpable on exam. Normal cervix not visualized. Friable mass seen in involving the vagina at the level of the cervix obliterating the anterior and posterior fornix.  ANESTHESIA:   General INTRAVENOUS FLUIDS:  300 ml of LR ESTIMATED BLOOD LOSS: 20 ml. SPECIMENS: pap smear, cervical mass biopsy COMPLICATIONS:  None immediate.     04/25/2018 PET scan    1. Large cervical mass may invade into the myometrium in down into the vagina, maximum SUV 21.3. There is a malignant left external iliac node measuring 1.4 cm in short axis with maximum SUV 14.1. 2. Accentuated activity in the cecum and ascending colon is likely physiologic given that it has no CT correlate. Correlation with the patient's colon cancer screening history is recommended. If screening is not up-to-date, appropriate screening should be considered. 3.  Aortic Atherosclerosis (ICD10-I70.0).     04/28/2018 Surgery    Pre-operative Diagnosis:  At least  Stage 2B SCCa Cervical Cancer  Post-operative Diagnosis: At least clinical Stage 3A SCCa Cervical Cancer  Operation:   Exam under anesthesia due to intolerance for vaginal exam in office Cystoscopy due to concern for extension to posterior bladder (from anterior vaginal wall lesion)  Operative Findings:   1. Parametrial involvement on right  2. Right sided subvaginal tumor burden ~3cm. This approximates the right ureteral insertion into the bladder based on my exam during cystoscopy. 3. Extension of disease down upper half of vagina on lateral and posterior walls. 4. Extension of disease down to lower third of anterior vagina (versus skip lesion) thus making her Stage 3A 5. Cystoscopy revealed patent bilateral ureteral orifices and no bladder mucosa invasion or areas of concern. 6. Rectal exam grossly no disease.     05/04/2018 Cancer Staging    Staging form: Cervix Uteri, AJCC 8th Edition - Clinical: Stage IIIB (cT3b, cN1, cM0) - Signed by [name], MD on 05/04/2018    05/12/2018 Procedure    Successful 8 French right internal jugular vein power port placement with its tip at the SVC/RA junction     REVIEW OF SYSTEMS:   Constitutional: Denies fevers, chills or abnormal weight loss Eyes: Denies blurriness of vision Ears, nose, mouth, throat, and face: Denies mucositis or sore throat Respiratory: Denies cough, dyspnea or wheezes Cardiovascular: Denies palpitation, chest discomfort or lower extremity swelling Skin: Denies abnormal skin rashes Lymphatics: Denies new lymphadenopathy or easy bruising Neurological:Denies numbness, tingling or new weaknesses Behavioral/Psych: Mood is stable, no new changes  All other systems were reviewed with the patient and are negative.  I have reviewed the past medical history, past surgical history, social history and family history with the patient and they are unchanged from previous note.  ALLERGIES:  has No Known Allergies.  MEDICATIONS:  Current Outpatient Medications  Medication Sig Dispense Refill  . acetaminophen (TYLENOL) 325 MG tablet Take 650 mg  by mouth every 6 (six) hours as needed.    . ferrous sulfate (FERROUSUL) 325 (65 FE) MG tablet Take 1 tablet (325 mg total) by mouth 2 (two) times daily. 60 tablet 1  . ibuprofen (ADVIL,MOTRIN) 600 MG tablet Take 1 tablet (600 mg total) by mouth every 6 (six) hours as needed. 60 tablet 3  . lidocaine-prilocaine (EMLA) cream Apply to affected area once 30 g 3  . Multiple Vitamins-Minerals (CENTRUM SILVER ULTRA WOMENS PO) Take 1 tablet by mouth daily.    . ondansetron (ZOFRAN) 8 MG tablet Take 1 tablet (8 mg total) by mouth every 8 (eight) hours as needed. Start on the third day after chemotherapy. 30 tablet 1  . oxyCODONE-acetaminophen (PERCOCET/ROXICET) 5-325 MG tablet Take 1-2 tablets by mouth every 6 (six) hours as needed. (Patient not taking: Reported on 05/04/2018) 20 tablet 0  . prochlorperazine (COMPAZINE) 10 MG tablet Take 1 tablet (10 mg total) by mouth every 6 (six) hours as needed (Nausea or vomiting). 30 tablet 1   No current facility-administered medications for this visit.     PHYSICAL EXAMINATION: ECOG PERFORMANCE STATUS: 1 - Symptomatic but completely ambulatory  Vitals:   05/19/18 1535  BP: 111/71  Pulse: 95  Resp: 18  Temp: 98.9 F (37.2 C)  SpO2: 100%   Filed Weights   05/19/18 1535  Weight: 83 lb 3.2 oz (37.7 kg)    GENERAL:alert, no distress and comfortable.  She looks thin and cachectic SKIN: skin color, texture, turgor are normal, no rashes or significant lesions EYES: normal, Conjunctiva are pink and  non-injected, sclera clear OROPHARYNX:no exudate, no erythema and lips, buccal mucosa, and tongue normal  NECK: supple, thyroid normal size, non-tender, without nodularity LYMPH:  no palpable lymphadenopathy in the cervical, axillary or inguinal LUNGS: clear to auscultation and percussion with normal breathing effort HEART: regular rate & rhythm and no murmurs and no lower extremity edema ABDOMEN:abdomen soft, non-tender and normal bowel  sounds Musculoskeletal:no cyanosis of digits and no clubbing  NEURO: alert & oriented x 3 with fluent speech, no focal motor/sensory deficits  LABORATORY DATA:  I have reviewed the data as listed    Component Value Date/Time   NA 139 05/18/2018 1545   K 4.3 05/18/2018 1545   CL 102 05/18/2018 1545   CO2 28 05/18/2018 1545   GLUCOSE 114 (H) 05/18/2018 1545   BUN 24 (H) 05/18/2018 1545   CREATININE 0.78 05/18/2018 1545   CALCIUM 9.3 05/18/2018 1545   PROT 7.2 05/18/2018 1545   ALBUMIN 3.5 05/18/2018 1545   AST 11 (L) 05/18/2018 1545   ALT 27 05/18/2018 1545   ALKPHOS 83 05/18/2018 1545   BILITOT <0.2 (L) 05/18/2018 1545   GFRNONAA >60 05/18/2018 1545   GFRAA >60 05/18/2018 1545    No results found for: SPEP, UPEP  Lab Results  Component Value Date   WBC 8.6 05/18/2018   NEUTROABS 5.8 05/18/2018   HGB 9.4 (L) 05/18/2018   HCT 31.3 (L) 05/18/2018   MCV 78.3 (L) 05/18/2018   PLT 249 05/18/2018      Chemistry      Component Value Date/Time   NA 139 05/18/2018 1545   K 4.3 05/18/2018 1545   CL 102 05/18/2018 1545   CO2 28 05/18/2018 1545   BUN 24 (H) 05/18/2018 1545   CREATININE 0.78 05/18/2018 1545      Component Value Date/Time   CALCIUM 9.3 05/18/2018 1545   ALKPHOS 83 05/18/2018 1545   AST 11 (L) 05/18/2018 1545   ALT 27 05/18/2018 1545   BILITOT <0.2 (L) 05/18/2018 1545       RADIOGRAPHIC STUDIES: I have personally reviewed the radiological images as listed and agreed with the findings in the report. Nm Pet Image Initial (pi) Skull Base To Thigh  Result Date: 04/25/2018 CLINICAL DATA:  Initial treatment strategy for cervical cancer. EXAM: NUCLEAR MEDICINE PET SKULL BASE TO THIGH TECHNIQUE: 5.0 mCi F-18 FDG was injected intravenously. Full-ring PET imaging was performed from the skull base to thigh after the radiotracer. CT data was obtained and used for attenuation correction and anatomic localization. Fasting blood glucose: 93 mg/dl COMPARISON:  1.9 CT  from 01/25/2012 and pelvic ultrasound from 03/13/2018 FINDINGS: Mediastinal blood pool activity: SUV max 2.1 NECK: Subtle asymmetry of the tonsillar tissues, with the maximum SUV on the right and 4.5 and on the left at 2.9. This is most likely to be incidental given the overall clinical scenario. Incidental CT findings: none CHEST: No significant abnormal hypermetabolic activity in this region. Incidental CT findings: Mild descending aortic atherosclerotic calcification. Minimal biapical pleuroparenchymal scarring. Linear subsegmental atelectasis or scarring in the left lower lobe. ABDOMEN/PELVIS: Prominent hypermetabolic activity associated with the cervical mass which appears to extend down towards vagina and vaginal vestibule, and possibly invade up into the myometrium. The upper portion of the mass has a maximum SUV of 21.3 and the lower portion 21.1. A left external iliac node measuring 1.4 cm in short axis on image 154/4 has a maximum SUV of 14.1, compatible with malignant involvement. There high activity in the cecum and  ascending colon without definite CT correlate, maximum SUV 14.9, probably physiologic. Incidental CT findings: Aortoiliac atherosclerotic vascular disease. SKELETON: No significant abnormal hypermetabolic activity in this region. Incidental CT findings: none IMPRESSION: 1. Large cervical mass may invade into the myometrium in down into the vagina, maximum SUV 21.3. There is a malignant left external iliac node measuring 1.4 cm in short axis with maximum SUV 14.1. 2. Accentuated activity in the cecum and ascending colon is likely physiologic given that it has no CT correlate. Correlation with the patient's colon cancer screening history is recommended. If screening is not up-to-date, appropriate screening should be considered. 3.  Aortic Atherosclerosis (ICD10-I70.0). Electronically Signed   By: Van Clines M.D.   On: 04/25/2018 17:46   Ir Imaging Guided Port Insertion  Result Date:  05/12/2018 CLINICAL DATA:  Cervical cancer EXAM: TUNNEL POWER PORT PLACEMENT WITH SUBCUTANEOUS POCKET UTILIZING ULTRASOUND & FLOUROSCOPY FLUOROSCOPY TIME:  54 seconds.  Three mGy. MEDICATIONS AND MEDICAL HISTORY: Versed 2 mg, Fentanyl 100 mcg. Additional Medications: Ancef 2 g. Antibiotics were given within 2 hours of the procedure. ANESTHESIA/SEDATION: Moderate sedation time: 34 minutes. Nursing monitored the the patient during the procedure. PROCEDURE: After written informed consent was obtained, patient was placed in the supine position on angiographic table. The right neck and chest was prepped and draped in a sterile fashion. Lidocaine was utilized for local anesthesia. The right jugular vein was noted to be patent initially with ultrasound. Under sonographic guidance, a micropuncture needle was inserted into the right IJ vein (Ultrasound and fluoroscopic image documentation was performed). The needle was removed over an 018 wire which was exchanged for a Amplatz. This was advanced into the IVC. An 8-French dilator was advanced over the Amplatz. A small incision was made in the right upper chest over the anterior right second rib. Utilizing blunt dissection, a subcutaneous pocket was created in the caudal direction. The pocket was irrigated with a copious amount of sterile normal saline. The port catheter was tunneled from the chest incision, and out the neck incision. The reservoir was inserted into the subcutaneous pocket and secured with two 3-0 Ethilon stitches. A peel-away sheath was advanced over the Amplatz wire. The port catheter was cut to measure length and inserted through the peel-away sheath. The peel-away sheath was removed. The chest incision was closed with 3-0 Vicryl interrupted stitches for the subcutaneous tissue and a running of 4-0 Vicryl subcuticular stitch for the skin. The neck incision was closed with a 4-0 Vicryl subcuticular stitch. Derma-bond was applied to both surgical incisions.  The port reservoir was flushed and instilled with heparinized saline. No complications. FINDINGS: A right IJ vein Port-A-Cath is in place with its tip at the cavoatrial junction. COMPLICATIONS: None IMPRESSION: Successful 8 French right internal jugular vein power port placement with its tip at the SVC/RA junction. Electronically Signed   By: Marybelle Killings M.D.   On: 05/12/2018 15:08    All questions were answered. The patient knows to call the clinic with any problems, questions or concerns. No barriers to learning was detected.  I spent 15 minutes counseling the patient face to face. The total time spent in the appointment was 20 minutes and more than 50% was on counseling and review of test results  [name], MD 05/19/2018 3:57 PM

## 2018-05-19 NOTE — Assessment & Plan Note (Signed)
She has persistent iron deficiency anemia and has received 1 dose of intravenous iron last week We will proceed with second dose tomorrow

## 2018-05-20 ENCOUNTER — Inpatient Hospital Stay: Payer: Medicaid Other

## 2018-05-20 ENCOUNTER — Ambulatory Visit: Payer: Medicaid Other | Admitting: Obstetrics

## 2018-05-20 ENCOUNTER — Ambulatory Visit
Admission: RE | Admit: 2018-05-20 | Discharge: 2018-05-20 | Disposition: A | Discharge status: Home / Self Care | Payer: Medicaid Other | Source: Ambulatory Visit | Attending: Radiation Oncology | Admitting: Radiation Oncology

## 2018-05-20 VITALS — BP 113/62 | HR 88 | Temp 98.2°F | Resp 18

## 2018-05-20 DIAGNOSIS — Z510 Encounter for antineoplastic radiation therapy: Secondary | ICD-10-CM | POA: Diagnosis not present

## 2018-05-20 DIAGNOSIS — Z5111 Encounter for antineoplastic chemotherapy: Secondary | ICD-10-CM | POA: Diagnosis not present

## 2018-05-20 DIAGNOSIS — C539 Malignant neoplasm of cervix uteri, unspecified: Secondary | ICD-10-CM

## 2018-05-20 MED ORDER — SODIUM CHLORIDE 0.9 % IV SOLN
Freq: Once | INTRAVENOUS | Status: AC
  Administered 2018-05-20: 12:00:00 via INTRAVENOUS
  Filled 2018-05-20: qty 5

## 2018-05-20 MED ORDER — PALONOSETRON HCL INJECTION 0.25 MG/5ML
0.2500 mg | Freq: Once | INTRAVENOUS | Status: AC
  Administered 2018-05-20: 0.25 mg via INTRAVENOUS

## 2018-05-20 MED ORDER — HEPARIN SOD (PORK) LOCK FLUSH 100 UNIT/ML IV SOLN
500.0000 [IU] | Freq: Once | INTRAVENOUS | Status: AC | PRN
  Administered 2018-05-20: 500 [IU]
  Filled 2018-05-20: qty 5

## 2018-05-20 MED ORDER — PALONOSETRON HCL INJECTION 0.25 MG/5ML
INTRAVENOUS | Status: AC
  Filled 2018-05-20: qty 5

## 2018-05-20 MED ORDER — POTASSIUM CHLORIDE 2 MEQ/ML IV SOLN
Freq: Once | INTRAVENOUS | Status: AC
  Administered 2018-05-20: 10:00:00 via INTRAVENOUS
  Filled 2018-05-20: qty 10

## 2018-05-20 MED ORDER — SODIUM CHLORIDE 0.9 % IV SOLN
Freq: Once | INTRAVENOUS | Status: AC
  Administered 2018-05-20: 10:00:00 via INTRAVENOUS
  Filled 2018-05-20: qty 250

## 2018-05-20 MED ORDER — SODIUM CHLORIDE 0.9 % IV SOLN
40.0000 mg/m2 | Freq: Once | INTRAVENOUS | Status: AC
  Administered 2018-05-20: 51 mg via INTRAVENOUS
  Filled 2018-05-20: qty 51

## 2018-05-20 MED ORDER — SODIUM CHLORIDE 0.9% FLUSH
10.0000 mL | INTRAVENOUS | Status: DC | PRN
  Administered 2018-05-20: 10 mL
  Filled 2018-05-20: qty 10

## 2018-05-20 NOTE — Patient Instructions (Signed)
[facility] Discharge Instructions for Patients Receiving Chemotherapy  Today you received the following chemotherapy agents Cisplatin  To help prevent nausea and vomiting after your treatment, we encourage you to take your nausea medication as prescribed.   If you develop nausea and vomiting that is not controlled by your nausea medication, call the clinic.   BELOW ARE SYMPTOMS THAT SHOULD BE REPORTED IMMEDIATELY:  *FEVER GREATER THAN 100.5 F  *CHILLS WITH OR WITHOUT FEVER  NAUSEA AND VOMITING THAT IS NOT CONTROLLED WITH YOUR NAUSEA MEDICATION  *UNUSUAL SHORTNESS OF BREATH  *UNUSUAL BRUISING OR BLEEDING  TENDERNESS IN MOUTH AND THROAT WITH OR WITHOUT PRESENCE OF ULCERS  *URINARY PROBLEMS  *BOWEL PROBLEMS  UNUSUAL RASH Items with * indicate a potential emergency and should be followed up as soon as possible.  Feel free to call the clinic should you have any questions or concerns. The clinic phone number is [phone].  Please show the CHEMO ALERT CARD at check-in to the Emergency Department and triage nurse.  Cisplatin injection What is this medicine? CISPLATIN (SIS pla tin) is a chemotherapy drug. It targets fast dividing cells, like cancer cells, and causes these cells to die. This medicine is used to treat many types of cancer like bladder, ovarian, and testicular cancers. This medicine may be used for other purposes; ask your health care provider or pharmacist if you have questions. COMMON BRAND NAME(S): Platinol, Platinol -AQ What should I tell my health care provider before I take this medicine? They need to know if you have any of these conditions: -blood disorders -hearing problems -kidney disease -recent or ongoing radiation therapy -an unusual or allergic reaction to cisplatin, carboplatin, other chemotherapy, other medicines, foods, dyes, or preservatives -pregnant or trying to get pregnant -breast-feeding How should I use this  medicine? This drug is given as an infusion into a vein. It is administered in a hospital or clinic by a specially trained health care professional. Talk to your pediatrician regarding the use of this medicine in children. Special care may be needed. Overdosage: If you think you have taken too much of this medicine contact a poison control center or emergency room at once. NOTE: This medicine is only for you. Do not share this medicine with others. What if I miss a dose? It is important not to miss a dose. Call your doctor or health care professional if you are unable to keep an appointment. What may interact with this medicine? -dofetilide -foscarnet -medicines for seizures -medicines to increase blood counts like filgrastim, pegfilgrastim, sargramostim -probenecid -pyridoxine used with altretamine -rituximab -some antibiotics like amikacin, gentamicin, neomycin, polymyxin B, streptomycin, tobramycin -sulfinpyrazone -vaccines -zalcitabine Talk to your doctor or health care professional before taking any of these medicines: -acetaminophen -aspirin -ibuprofen -ketoprofen -naproxen This list may not describe all possible interactions. Give your health care provider a list of all the medicines, herbs, non-prescription drugs, or dietary supplements you use. Also tell them if you smoke, drink alcohol, or use illegal drugs. Some items may interact with your medicine. What should I watch for while using this medicine? Your condition will be monitored carefully while you are receiving this medicine. You will need important blood work done while you are taking this medicine. This drug may make you feel generally unwell. This is not uncommon, as chemotherapy can affect healthy cells as well as cancer cells. Report any side effects. Continue your course of treatment even though you feel ill unless your doctor tells you to stop.   In some cases, you may be given additional medicines to help with side  effects. Follow all directions for their use. Call your doctor or health care professional for advice if you get a fever, chills or sore throat, or other symptoms of a cold or flu. Do not treat yourself. This drug decreases your body's ability to fight infections. Try to avoid being around people who are sick. This medicine may increase your risk to bruise or bleed. Call your doctor or health care professional if you notice any unusual bleeding. Be careful brushing and flossing your teeth or using a toothpick because you may get an infection or bleed more easily. If you have any dental work done, tell your dentist you are receiving this medicine. Avoid taking products that contain aspirin, acetaminophen, ibuprofen, naproxen, or ketoprofen unless instructed by your doctor. These medicines may hide a fever. Do not become pregnant while taking this medicine. Women should inform their doctor if they wish to become pregnant or think they might be pregnant. There is a potential for serious side effects to an unborn child. Talk to your health care professional or pharmacist for more information. Do not breast-feed an infant while taking this medicine. Drink fluids as directed while you are taking this medicine. This will help protect your kidneys. Call your doctor or health care professional if you get diarrhea. Do not treat yourself. What side effects may I notice from receiving this medicine? Side effects that you should report to your doctor or health care professional as soon as possible: -allergic reactions like skin rash, itching or hives, swelling of the face, lips, or tongue -signs of infection - fever or chills, cough, sore throat, pain or difficulty passing urine -signs of decreased platelets or bleeding - bruising, pinpoint red spots on the skin, black, tarry stools, nosebleeds -signs of decreased red blood cells - unusually weak or tired, fainting spells, lightheadedness -breathing  problems -changes in hearing -gout pain -low blood counts - This drug may decrease the number of white blood cells, red blood cells and platelets. You may be at increased risk for infections and bleeding. -nausea and vomiting -pain, swelling, redness or irritation at the injection site -pain, tingling, numbness in the hands or feet -problems with balance, movement -trouble passing urine or change in the amount of urine Side effects that usually do not require medical attention (report to your doctor or health care professional if they continue or are bothersome): -changes in vision -loss of appetite -metallic taste in the mouth or changes in taste This list may not describe all possible side effects. Call your doctor for medical advice about side effects. You may report side effects to FDA at 1-800-FDA-1088. Where should I keep my medicine? This drug is given in a hospital or clinic and will not be stored at home. NOTE: This sheet is a summary. It may not cover all possible information. If you have questions about this medicine, talk to your doctor, pharmacist, or health care provider.  2018 Elsevier/Gold Standard (2007-12-06 14:40:54)  

## 2018-05-23 ENCOUNTER — Ambulatory Visit
Admission: RE | Admit: 2018-05-23 | Discharge: 2018-05-23 | Disposition: A | Discharge status: Home / Self Care | Payer: Medicaid Other | Source: Ambulatory Visit | Attending: Radiation Oncology | Admitting: Radiation Oncology

## 2018-05-23 DIAGNOSIS — Z510 Encounter for antineoplastic radiation therapy: Secondary | ICD-10-CM | POA: Diagnosis not present

## 2018-05-24 ENCOUNTER — Ambulatory Visit
Admission: RE | Admit: 2018-05-24 | Discharge: 2018-05-24 | Disposition: A | Discharge status: Home / Self Care | Payer: Medicaid Other | Source: Ambulatory Visit | Attending: Radiation Oncology | Admitting: Radiation Oncology

## 2018-05-24 DIAGNOSIS — Z510 Encounter for antineoplastic radiation therapy: Secondary | ICD-10-CM | POA: Diagnosis not present

## 2018-05-25 ENCOUNTER — Inpatient Hospital Stay: Payer: Medicaid Other

## 2018-05-25 ENCOUNTER — Ambulatory Visit
Admission: RE | Admit: 2018-05-25 | Discharge: 2018-05-25 | Disposition: A | Discharge status: Home / Self Care | Payer: Medicaid Other | Source: Ambulatory Visit | Attending: Radiation Oncology | Admitting: Radiation Oncology

## 2018-05-25 DIAGNOSIS — C539 Malignant neoplasm of cervix uteri, unspecified: Secondary | ICD-10-CM

## 2018-05-25 DIAGNOSIS — Z510 Encounter for antineoplastic radiation therapy: Secondary | ICD-10-CM | POA: Diagnosis not present

## 2018-05-25 DIAGNOSIS — Z5111 Encounter for antineoplastic chemotherapy: Secondary | ICD-10-CM | POA: Diagnosis not present

## 2018-05-25 LAB — CBC WITH DIFFERENTIAL/PLATELET
BASOS ABS: 0 10*3/uL (ref 0.0–0.1)
Basophils Relative: 0 %
EOS ABS: 0.1 10*3/uL (ref 0.0–0.5)
EOS PCT: 3 %
HCT: 30.7 % — ABNORMAL LOW (ref 34.8–46.6)
Hemoglobin: 9.6 g/dL — ABNORMAL LOW (ref 11.6–15.9)
Lymphocytes Relative: 9 %
Lymphs Abs: 0.5 10*3/uL — ABNORMAL LOW (ref 0.9–3.3)
MCH: 23.9 pg — ABNORMAL LOW (ref 25.1–34.0)
MCHC: 31.3 g/dL — AB (ref 31.5–36.0)
MCV: 76.2 fL — ABNORMAL LOW (ref 79.5–101.0)
MONO ABS: 0.4 10*3/uL (ref 0.1–0.9)
MONOS PCT: 8 %
NEUTROS ABS: 4.2 10*3/uL (ref 1.5–6.5)
Neutrophils Relative %: 80 %
PLATELETS: 185 10*3/uL (ref 145–400)
RBC: 4.03 MIL/uL (ref 3.70–5.45)
RDW: 18.8 % — AB (ref 11.2–14.5)
WBC: 5.3 10*3/uL (ref 3.9–10.3)

## 2018-05-25 LAB — COMPREHENSIVE METABOLIC PANEL
ALBUMIN: 3.6 g/dL (ref 3.5–5.0)
ALT: 10 U/L (ref 0–44)
ANION GAP: 11 (ref 5–15)
AST: 11 U/L — AB (ref 15–41)
Alkaline Phosphatase: 80 U/L (ref 38–126)
BUN: 15 mg/dL (ref 8–23)
CALCIUM: 9.1 mg/dL (ref 8.9–10.3)
CHLORIDE: 102 mmol/L (ref 98–111)
CO2: 24 mmol/L (ref 22–32)
Creatinine, Ser: 0.8 mg/dL (ref 0.44–1.00)
GFR calc Af Amer: >60 mL/min (ref >60)
GFR calc non Af Amer: >60 mL/min (ref >60)
GLUCOSE: 106 mg/dL — AB (ref 70–99)
POTASSIUM: 3.1 mmol/L — AB (ref 3.5–5.1)
Sodium: 137 mmol/L (ref 135–145)
Total Bilirubin: <0.2 mg/dL — ABNORMAL LOW (ref 0.3–1.2)
Total Protein: 7.1 g/dL (ref 6.5–8.1)

## 2018-05-25 LAB — MAGNESIUM: MAGNESIUM: 1.9 mg/dL (ref 1.7–2.4)

## 2018-05-26 ENCOUNTER — Inpatient Hospital Stay (HOSPITAL_BASED_OUTPATIENT_CLINIC_OR_DEPARTMENT_OTHER): Payer: Medicaid Other | Admitting: Hematology and Oncology

## 2018-05-26 ENCOUNTER — Ambulatory Visit
Admission: RE | Admit: 2018-05-26 | Discharge: 2018-05-26 | Disposition: A | Discharge status: Home / Self Care | Payer: Medicaid Other | Source: Ambulatory Visit | Attending: Radiation Oncology | Admitting: Radiation Oncology

## 2018-05-26 ENCOUNTER — Encounter: Payer: Self-pay | Admitting: Hematology and Oncology

## 2018-05-26 DIAGNOSIS — D509 Iron deficiency anemia, unspecified: Secondary | ICD-10-CM

## 2018-05-26 DIAGNOSIS — Z79899 Other long term (current) drug therapy: Secondary | ICD-10-CM | POA: Diagnosis not present

## 2018-05-26 DIAGNOSIS — C539 Malignant neoplasm of cervix uteri, unspecified: Secondary | ICD-10-CM

## 2018-05-26 DIAGNOSIS — R64 Cachexia: Secondary | ICD-10-CM

## 2018-05-26 DIAGNOSIS — Z510 Encounter for antineoplastic radiation therapy: Secondary | ICD-10-CM | POA: Diagnosis not present

## 2018-05-26 DIAGNOSIS — D500 Iron deficiency anemia secondary to blood loss (chronic): Secondary | ICD-10-CM

## 2018-05-26 DIAGNOSIS — E876 Hypokalemia: Secondary | ICD-10-CM | POA: Insufficient documentation

## 2018-05-26 DIAGNOSIS — R197 Diarrhea, unspecified: Secondary | ICD-10-CM

## 2018-05-26 DIAGNOSIS — Z5111 Encounter for antineoplastic chemotherapy: Secondary | ICD-10-CM

## 2018-05-26 DIAGNOSIS — K5900 Constipation, unspecified: Secondary | ICD-10-CM

## 2018-05-26 DIAGNOSIS — I700 Atherosclerosis of aorta: Secondary | ICD-10-CM

## 2018-05-26 NOTE — Assessment & Plan Note (Signed)
She tolerated treatment well except for mild diarrhea and poor oral intake. She has lost some weight due to reduced oral intake of food I will proceed with treatment tomorrow as schedule without dose adjustment and plan to see her every week for supportive care

## 2018-05-26 NOTE — Progress Notes (Signed)
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  The type of ins pt has her treatment should be paid at 100%.  I informed her of the [place] and [employer].  Pt is not working right now so she provided a letter of support and was approved for the $400 [place] and $400 [employer] grants.  She has my card for any questions or concerns she may have in the future.

## 2018-05-26 NOTE — Progress Notes (Signed)
[name] OFFICE PROGRESS NOTE  Patient Care Team: Patient, [name] as PCP - General (General Practice)  ASSESSMENT & PLAN:  Malignant neoplasm of cervix ([place]) She tolerated treatment well except for mild diarrhea and poor oral intake. She has lost some weight due to reduced oral intake of food I will proceed with treatment tomorrow as schedule without dose adjustment and plan to see her every week for supportive care  Iron deficiency anemia She has received recent intravenous iron infusion with improvement of her blood count.  We will continue close observation for now  Acute hypokalemia This is likely due to poor oral intake, recent diarrhea and side effects of chemotherapy Hypokalemia is mild I recommend potassium rich diet only instead of potassium replacement therapy She is given patient education handout on this  Cachexia ([place]) Encourage frequent small meals as tolerated.   No orders of the defined types were placed in this encounter.   INTERVAL HISTORY: Please see below for problem oriented charting. She returns for further follow-up She has lost some weight since last time I saw her She attributed that to recent diarrhea which is self-limiting She complained of mild vaginal discharge but denies pain No nausea, vomiting or mucositis Denies peripheral neuropathy.  SUMMARY OF ONCOLOGIC HISTORY:   Malignant neoplasm of cervix ([place])   03/13/2018 Imaging    US pelvis There are mobile echogenic foci within the endometrial cavity suggestive of gas. This is nonspecific in etiology however may be secondary to endometritis. Correlate for history of recent instrumentation. This obscures visualization of the endometrial tissue and therefore a follow-up pelvic ultrasound in 2-4 weeks is recommended after resolution of the acute symptomatology if symptoms persist to further evaluate the endometrium.     03/13/2018 Initial Diagnosis    She initially presented  with PMB    04/12/2018 Pathology Results    Cervix, biopsy, mass - INVASIVE SQUAMOUS CELL CARCINOMA - SEE COMMENT    04/12/2018 Surgery    PREOPERATIVE DIAGNOSIS:  Postmenopausal vaginal bleeding. POSTOPERATIVE DIAGNOSIS: The same PROCEDURE: Exam under anesthesia, pap smear, cervical mass biopsy SURGEON:  Dr. [name]  INDICATIONS: 67 y.o. yo [id number] with PMB here for exam under anesthesia.Risks of surgery were discussed with the patient including but not limited to: bleeding which may require transfusion; infection which may require antibiotics; injury to uterus or surrounding organs; need for additional procedures including laparotomy or laparoscopy; and other postoperative/anesthesia complications. Written informed consent was obtained.    FINDINGS:  An 8-week size midline uterus.  No adnexal mass palpable on exam. Normal cervix not visualized. Friable mass seen in involving the vagina at the level of the cervix obliterating the anterior and posterior fornix.  ANESTHESIA:   General INTRAVENOUS FLUIDS:  300 ml of LR ESTIMATED BLOOD LOSS: 20 ml. SPECIMENS: pap smear, cervical mass biopsy COMPLICATIONS:  None immediate.     04/25/2018 PET scan    1. Large cervical mass may invade into the myometrium in down into the vagina, maximum SUV 21.3. There is a malignant left external iliac node measuring 1.4 cm in short axis with maximum SUV 14.1. 2. Accentuated activity in the cecum and ascending colon is likely physiologic given that it has no CT correlate. Correlation with the patient's colon cancer screening history is recommended. If screening is not up-to-date, appropriate screening should be considered. 3.  Aortic Atherosclerosis (ICD10-I70.0).     04/28/2018 Surgery    Pre-operative Diagnosis:  At least Stage 2B SCCa Cervical Cancer  Post-operative Diagnosis: At least clinical Stage 3A SCCa Cervical Cancer  Operation:  Exam under anesthesia due to intolerance for vaginal  exam in office Cystoscopy due to concern for extension to posterior bladder (from anterior vaginal wall lesion)  Operative Findings:   1. Parametrial involvement on right  2. Right sided subvaginal tumor burden ~3cm. This approximates the right ureteral insertion into the bladder based on my exam during cystoscopy. 3. Extension of disease down upper half of vagina on lateral and posterior walls. 4. Extension of disease down to lower third of anterior vagina (versus skip lesion) thus making her Stage 3A 5. Cystoscopy revealed patent bilateral ureteral orifices and no bladder mucosa invasion or areas of concern. 6. Rectal exam grossly no disease.     05/04/2018 Cancer Staging    Staging form: Cervix Uteri, AJCC 8th Edition - Clinical: Stage IIIB (cT3b, cN1, cM0) - Signed by [name], MD on 05/04/2018    05/12/2018 Procedure    Successful 8 French right internal jugular vein power port placement with its tip at the SVC/RA junction     REVIEW OF SYSTEMS:   Constitutional: Denies fevers, chills  Eyes: Denies blurriness of vision Ears, nose, mouth, throat, and face: Denies mucositis or sore throat Respiratory: Denies cough, dyspnea or wheezes Cardiovascular: Denies palpitation, chest discomfort or lower extremity swelling Skin: Denies abnormal skin rashes Lymphatics: Denies new lymphadenopathy or easy bruising Neurological:Denies numbness, tingling or new weaknesses Behavioral/Psych: Mood is stable, no new changes  All other systems were reviewed with the patient and are negative.  I have reviewed the past medical history, past surgical history, social history and family history with the patient and they are unchanged from previous note.  ALLERGIES:  has No Known Allergies.  MEDICATIONS:  Current Outpatient Medications  Medication Sig Dispense Refill  . acetaminophen (TYLENOL) 325 MG tablet Take 650 mg by mouth every 6 (six) hours as needed.    . ferrous sulfate (FERROUSUL) 325  (65 FE) MG tablet Take 1 tablet (325 mg total) by mouth 2 (two) times daily. 60 tablet 1  . ibuprofen (ADVIL,MOTRIN) 600 MG tablet Take 1 tablet (600 mg total) by mouth every 6 (six) hours as needed. 60 tablet 3  . lidocaine-prilocaine (EMLA) cream Apply to affected area once 30 g 3  . Multiple Vitamins-Minerals (CENTRUM SILVER ULTRA WOMENS PO) Take 1 tablet by mouth daily.    . ondansetron (ZOFRAN) 8 MG tablet Take 1 tablet (8 mg total) by mouth every 8 (eight) hours as needed. Start on the third day after chemotherapy. 30 tablet 1  . oxyCODONE-acetaminophen (PERCOCET/ROXICET) 5-325 MG tablet Take 1-2 tablets by mouth every 6 (six) hours as needed. (Patient not taking: Reported on 05/04/2018) 20 tablet 0  . prochlorperazine (COMPAZINE) 10 MG tablet Take 1 tablet (10 mg total) by mouth every 6 (six) hours as needed (Nausea or vomiting). 30 tablet 1   No current facility-administered medications for this visit.     PHYSICAL EXAMINATION: ECOG PERFORMANCE STATUS: 1 - Symptomatic but completely ambulatory  Vitals:   05/26/18 1532  BP: 111/66  Pulse: 88  Resp: 18  Temp: 98.6 F (37 C)  SpO2: 100%   Filed Weights   05/26/18 1532  Weight: 82 lb 12.8 oz (37.6 kg)    GENERAL:alert, no distress and comfortable.  She looks thin and cachectic SKIN: skin color, texture, turgor are normal, no rashes or significant lesions EYES: normal, Conjunctiva are pink and non-injected, sclera clear OROPHARYNX:no exudate, no erythema and lips,  buccal mucosa, and tongue normal  NECK: supple, thyroid normal size, non-tender, without nodularity LYMPH:  no palpable lymphadenopathy in the cervical, axillary or inguinal LUNGS: clear to auscultation and percussion with normal breathing effort HEART: regular rate & rhythm and no murmurs and no lower extremity edema ABDOMEN:abdomen soft, non-tender and normal bowel sounds Musculoskeletal:no cyanosis of digits and no clubbing  NEURO: alert & oriented x 3 with fluent  speech, no focal motor/sensory deficits  LABORATORY DATA:  I have reviewed the data as listed    Component Value Date/Time   NA 137 05/25/2018 1542   K 3.1 (L) 05/25/2018 1542   CL 102 05/25/2018 1542   CO2 24 05/25/2018 1542   GLUCOSE 106 (H) 05/25/2018 1542   BUN 15 05/25/2018 1542   CREATININE 0.80 05/25/2018 1542   CALCIUM 9.1 05/25/2018 1542   PROT 7.1 05/25/2018 1542   ALBUMIN 3.6 05/25/2018 1542   AST 11 (L) 05/25/2018 1542   ALT 10 05/25/2018 1542   ALKPHOS 80 05/25/2018 1542   BILITOT <0.2 (L) 05/25/2018 1542   GFRNONAA >60 05/25/2018 1542   GFRAA >60 05/25/2018 1542    No results found for: SPEP, UPEP  Lab Results  Component Value Date   WBC 5.3 05/25/2018   NEUTROABS 4.2 05/25/2018   HGB 9.6 (L) 05/25/2018   HCT 30.7 (L) 05/25/2018   MCV 76.2 (L) 05/25/2018   PLT 185 05/25/2018      Chemistry      Component Value Date/Time   NA 137 05/25/2018 1542   K 3.1 (L) 05/25/2018 1542   CL 102 05/25/2018 1542   CO2 24 05/25/2018 1542   BUN 15 05/25/2018 1542   CREATININE 0.80 05/25/2018 1542      Component Value Date/Time   CALCIUM 9.1 05/25/2018 1542   ALKPHOS 80 05/25/2018 1542   AST 11 (L) 05/25/2018 1542   ALT 10 05/25/2018 1542   BILITOT <0.2 (L) 05/25/2018 1542       RADIOGRAPHIC STUDIES: I have personally reviewed the radiological images as listed and agreed with the findings in the report. Ir Imaging Guided Port Insertion  Result Date: 05/12/2018 CLINICAL DATA:  Cervical cancer EXAM: TUNNEL POWER PORT PLACEMENT WITH SUBCUTANEOUS POCKET UTILIZING ULTRASOUND & FLOUROSCOPY FLUOROSCOPY TIME:  54 seconds.  Three mGy. MEDICATIONS AND MEDICAL HISTORY: Versed 2 mg, Fentanyl 100 mcg. Additional Medications: Ancef 2 g. Antibiotics were given within 2 hours of the procedure. ANESTHESIA/SEDATION: Moderate sedation time: 34 minutes. Nursing monitored the the patient during the procedure. PROCEDURE: After written informed consent was obtained, patient was placed  in the supine position on angiographic table. The right neck and chest was prepped and draped in a sterile fashion. Lidocaine was utilized for local anesthesia. The right jugular vein was noted to be patent initially with ultrasound. Under sonographic guidance, a micropuncture needle was inserted into the right IJ vein (Ultrasound and fluoroscopic image documentation was performed). The needle was removed over an 018 wire which was exchanged for a Amplatz. This was advanced into the IVC. An 8-French dilator was advanced over the Amplatz. A small incision was made in the right upper chest over the anterior right second rib. Utilizing blunt dissection, a subcutaneous pocket was created in the caudal direction. The pocket was irrigated with a copious amount of sterile normal saline. The port catheter was tunneled from the chest incision, and out the neck incision. The reservoir was inserted into the subcutaneous pocket and secured with two 3-0 Ethilon stitches. A peel-away sheath  was advanced over the Amplatz wire. The port catheter was cut to measure length and inserted through the peel-away sheath. The peel-away sheath was removed. The chest incision was closed with 3-0 Vicryl interrupted stitches for the subcutaneous tissue and a running of 4-0 Vicryl subcuticular stitch for the skin. The neck incision was closed with a 4-0 Vicryl subcuticular stitch. Derma-bond was applied to both surgical incisions. The port reservoir was flushed and instilled with heparinized saline. No complications. FINDINGS: A right IJ vein Port-A-Cath is in place with its tip at the cavoatrial junction. COMPLICATIONS: None IMPRESSION: Successful 8 French right internal jugular vein power port placement with its tip at the SVC/RA junction. Electronically Signed   By: Marybelle Killings M.D.   On: 05/12/2018 15:08    All questions were answered. The patient knows to call the clinic with any problems, questions or concerns. No barriers to learning  was detected.  I spent 15 minutes counseling the patient face to face. The total time spent in the appointment was 20 minutes and more than 50% was on counseling and review of test results  [name], MD 05/26/2018 3:50 PM

## 2018-05-26 NOTE — Assessment & Plan Note (Signed)
Encourage frequent small meals as tolerated.

## 2018-05-26 NOTE — Assessment & Plan Note (Signed)
She has received recent intravenous iron infusion with improvement of her blood count.  We will continue close observation for now

## 2018-05-26 NOTE — Assessment & Plan Note (Signed)
This is likely due to poor oral intake, recent diarrhea and side effects of chemotherapy Hypokalemia is mild I recommend potassium rich diet only instead of potassium replacement therapy She is given patient education handout on this

## 2018-05-27 ENCOUNTER — Ambulatory Visit
Admission: RE | Admit: 2018-05-27 | Discharge: 2018-05-27 | Disposition: A | Discharge status: Home / Self Care | Payer: Medicaid Other | Source: Ambulatory Visit | Attending: Radiation Oncology | Admitting: Radiation Oncology

## 2018-05-27 ENCOUNTER — Inpatient Hospital Stay: Payer: Medicaid Other

## 2018-05-27 VITALS — BP 115/73 | HR 82 | Temp 98.0°F | Resp 17 | Wt 82.8 lb

## 2018-05-27 DIAGNOSIS — Z510 Encounter for antineoplastic radiation therapy: Secondary | ICD-10-CM | POA: Diagnosis not present

## 2018-05-27 DIAGNOSIS — C539 Malignant neoplasm of cervix uteri, unspecified: Secondary | ICD-10-CM

## 2018-05-27 DIAGNOSIS — Z5111 Encounter for antineoplastic chemotherapy: Secondary | ICD-10-CM | POA: Diagnosis not present

## 2018-05-27 MED ORDER — HEPARIN SOD (PORK) LOCK FLUSH 100 UNIT/ML IV SOLN
500.0000 [IU] | Freq: Once | INTRAVENOUS | Status: AC | PRN
  Administered 2018-05-27: 500 [IU]
  Filled 2018-05-27: qty 5

## 2018-05-27 MED ORDER — PALONOSETRON HCL INJECTION 0.25 MG/5ML
INTRAVENOUS | Status: AC
  Filled 2018-05-27: qty 5

## 2018-05-27 MED ORDER — SODIUM CHLORIDE 0.9 % IV SOLN
40.0000 mg/m2 | Freq: Once | INTRAVENOUS | Status: AC
  Administered 2018-05-27: 51 mg via INTRAVENOUS
  Filled 2018-05-27: qty 51

## 2018-05-27 MED ORDER — POTASSIUM CHLORIDE 2 MEQ/ML IV SOLN
Freq: Once | INTRAVENOUS | Status: AC
  Administered 2018-05-27: 10:00:00 via INTRAVENOUS
  Filled 2018-05-27: qty 10

## 2018-05-27 MED ORDER — SODIUM CHLORIDE 0.9 % IV SOLN
Freq: Once | INTRAVENOUS | Status: AC
  Administered 2018-05-27: 09:00:00 via INTRAVENOUS
  Filled 2018-05-27: qty 250

## 2018-05-27 MED ORDER — SODIUM CHLORIDE 0.9 % IV SOLN
Freq: Once | INTRAVENOUS | Status: AC
  Administered 2018-05-27: 12:00:00 via INTRAVENOUS
  Filled 2018-05-27: qty 5

## 2018-05-27 MED ORDER — SODIUM CHLORIDE 0.9% FLUSH
10.0000 mL | INTRAVENOUS | Status: DC | PRN
  Administered 2018-05-27: 10 mL
  Filled 2018-05-27: qty 10

## 2018-05-27 MED ORDER — PALONOSETRON HCL INJECTION 0.25 MG/5ML
0.2500 mg | Freq: Once | INTRAVENOUS | Status: AC
  Administered 2018-05-27: 0.25 mg via INTRAVENOUS

## 2018-05-27 NOTE — Patient Instructions (Signed)
[facility] Discharge Instructions for Patients Receiving Chemotherapy  Today you received the following chemotherapy agents Cisplatin  To help prevent nausea and vomiting after your treatment, we encourage you to take your nausea medication as prescribed.   If you develop nausea and vomiting that is not controlled by your nausea medication, call the clinic.   BELOW ARE SYMPTOMS THAT SHOULD BE REPORTED IMMEDIATELY:  *FEVER GREATER THAN 100.5 F  *CHILLS WITH OR WITHOUT FEVER  NAUSEA AND VOMITING THAT IS NOT CONTROLLED WITH YOUR NAUSEA MEDICATION  *UNUSUAL SHORTNESS OF BREATH  *UNUSUAL BRUISING OR BLEEDING  TENDERNESS IN MOUTH AND THROAT WITH OR WITHOUT PRESENCE OF ULCERS  *URINARY PROBLEMS  *BOWEL PROBLEMS  UNUSUAL RASH Items with * indicate a potential emergency and should be followed up as soon as possible.  Feel free to call the clinic should you have any questions or concerns. The clinic phone number is [phone].  Please show the CHEMO ALERT CARD at check-in to the Emergency Department and triage nurse.  Cisplatin injection What is this medicine? CISPLATIN (SIS pla tin) is a chemotherapy drug. It targets fast dividing cells, like cancer cells, and causes these cells to die. This medicine is used to treat many types of cancer like bladder, ovarian, and testicular cancers. This medicine may be used for other purposes; ask your health care provider or pharmacist if you have questions. COMMON BRAND NAME(S): Platinol, Platinol -AQ What should I tell my health care provider before I take this medicine? They need to know if you have any of these conditions: -blood disorders -hearing problems -kidney disease -recent or ongoing radiation therapy -an unusual or allergic reaction to cisplatin, carboplatin, other chemotherapy, other medicines, foods, dyes, or preservatives -pregnant or trying to get pregnant -breast-feeding How should I use this  medicine? This drug is given as an infusion into a vein. It is administered in a hospital or clinic by a specially trained health care professional. Talk to your pediatrician regarding the use of this medicine in children. Special care may be needed. Overdosage: If you think you have taken too much of this medicine contact a poison control center or emergency room at once. NOTE: This medicine is only for you. Do not share this medicine with others. What if I miss a dose? It is important not to miss a dose. Call your doctor or health care professional if you are unable to keep an appointment. What may interact with this medicine? -dofetilide -foscarnet -medicines for seizures -medicines to increase blood counts like filgrastim, pegfilgrastim, sargramostim -probenecid -pyridoxine used with altretamine -rituximab -some antibiotics like amikacin, gentamicin, neomycin, polymyxin B, streptomycin, tobramycin -sulfinpyrazone -vaccines -zalcitabine Talk to your doctor or health care professional before taking any of these medicines: -acetaminophen -aspirin -ibuprofen -ketoprofen -naproxen This list may not describe all possible interactions. Give your health care provider a list of all the medicines, herbs, non-prescription drugs, or dietary supplements you use. Also tell them if you smoke, drink alcohol, or use illegal drugs. Some items may interact with your medicine. What should I watch for while using this medicine? Your condition will be monitored carefully while you are receiving this medicine. You will need important blood work done while you are taking this medicine. This drug may make you feel generally unwell. This is not uncommon, as chemotherapy can affect healthy cells as well as cancer cells. Report any side effects. Continue your course of treatment even though you feel ill unless your doctor tells you to stop.   In some cases, you may be given additional medicines to help with side  effects. Follow all directions for their use. Call your doctor or health care professional for advice if you get a fever, chills or sore throat, or other symptoms of a cold or flu. Do not treat yourself. This drug decreases your body's ability to fight infections. Try to avoid being around people who are sick. This medicine may increase your risk to bruise or bleed. Call your doctor or health care professional if you notice any unusual bleeding. Be careful brushing and flossing your teeth or using a toothpick because you may get an infection or bleed more easily. If you have any dental work done, tell your dentist you are receiving this medicine. Avoid taking products that contain aspirin, acetaminophen, ibuprofen, naproxen, or ketoprofen unless instructed by your doctor. These medicines may hide a fever. Do not become pregnant while taking this medicine. Women should inform their doctor if they wish to become pregnant or think they might be pregnant. There is a potential for serious side effects to an unborn child. Talk to your health care professional or pharmacist for more information. Do not breast-feed an infant while taking this medicine. Drink fluids as directed while you are taking this medicine. This will help protect your kidneys. Call your doctor or health care professional if you get diarrhea. Do not treat yourself. What side effects may I notice from receiving this medicine? Side effects that you should report to your doctor or health care professional as soon as possible: -allergic reactions like skin rash, itching or hives, swelling of the face, lips, or tongue -signs of infection - fever or chills, cough, sore throat, pain or difficulty passing urine -signs of decreased platelets or bleeding - bruising, pinpoint red spots on the skin, black, tarry stools, nosebleeds -signs of decreased red blood cells - unusually weak or tired, fainting spells, lightheadedness -breathing  problems -changes in hearing -gout pain -low blood counts - This drug may decrease the number of white blood cells, red blood cells and platelets. You may be at increased risk for infections and bleeding. -nausea and vomiting -pain, swelling, redness or irritation at the injection site -pain, tingling, numbness in the hands or feet -problems with balance, movement -trouble passing urine or change in the amount of urine Side effects that usually do not require medical attention (report to your doctor or health care professional if they continue or are bothersome): -changes in vision -loss of appetite -metallic taste in the mouth or changes in taste This list may not describe all possible side effects. Call your doctor for medical advice about side effects. You may report side effects to FDA at 1-800-FDA-1088. Where should I keep my medicine? This drug is given in a hospital or clinic and will not be stored at home. NOTE: This sheet is a summary. It may not cover all possible information. If you have questions about this medicine, talk to your doctor, pharmacist, or health care provider.  2018 Elsevier/Gold Standard (2007-12-06 14:40:54)  

## 2018-05-29 ENCOUNTER — Other Ambulatory Visit: Payer: Self-pay

## 2018-05-29 ENCOUNTER — Encounter (HOSPITAL_COMMUNITY): Payer: Self-pay | Admitting: Emergency Medicine

## 2018-05-29 ENCOUNTER — Emergency Department (HOSPITAL_COMMUNITY)
Admission: EM | Admit: 2018-05-29 | Discharge: 2018-05-29 | Disposition: A | Discharge status: Home / Self Care | Payer: Medicaid Other | Attending: Emergency Medicine | Admitting: Emergency Medicine

## 2018-05-29 DIAGNOSIS — R5381 Other malaise: Secondary | ICD-10-CM | POA: Diagnosis not present

## 2018-05-29 DIAGNOSIS — R197 Diarrhea, unspecified: Secondary | ICD-10-CM | POA: Diagnosis present

## 2018-05-29 DIAGNOSIS — C539 Malignant neoplasm of cervix uteri, unspecified: Secondary | ICD-10-CM

## 2018-05-29 LAB — COMPREHENSIVE METABOLIC PANEL
ALBUMIN: 3.1 g/dL — AB (ref 3.5–5.0)
ALT: 14 U/L (ref 0–44)
AST: 15 U/L (ref 15–41)
Alkaline Phosphatase: 48 U/L (ref 38–126)
Anion gap: 7 (ref 5–15)
BUN: 13 mg/dL (ref 8–23)
CHLORIDE: 104 mmol/L (ref 98–111)
CO2: 26 mmol/L (ref 22–32)
CREATININE: 0.58 mg/dL (ref 0.44–1.00)
Calcium: 8.2 mg/dL — ABNORMAL LOW (ref 8.9–10.3)
GFR calc Af Amer: >60 mL/min (ref >60)
GFR calc non Af Amer: >60 mL/min (ref >60)
GLUCOSE: 87 mg/dL (ref 70–99)
POTASSIUM: 3.3 mmol/L — AB (ref 3.5–5.1)
Sodium: 137 mmol/L (ref 135–145)
Total Bilirubin: 0.4 mg/dL (ref 0.3–1.2)
Total Protein: 5.8 g/dL — ABNORMAL LOW (ref 6.5–8.1)

## 2018-05-29 LAB — CBC WITH DIFFERENTIAL/PLATELET
Basophils Absolute: 0 10*3/uL (ref 0.0–0.1)
Basophils Relative: 0 %
EOS ABS: 0.1 10*3/uL (ref 0.0–0.7)
Eosinophils Relative: 3 %
HEMATOCRIT: 25.4 % — AB (ref 36.0–46.0)
HEMOGLOBIN: 8 g/dL — AB (ref 12.0–15.0)
LYMPHS ABS: 0.5 10*3/uL — AB (ref 0.7–4.0)
Lymphocytes Relative: 13 %
MCH: 25 pg — AB (ref 26.0–34.0)
MCHC: 31.5 g/dL (ref 30.0–36.0)
MCV: 79.4 fL (ref 78.0–100.0)
MONOS PCT: 10 %
Monocytes Absolute: 0.3 10*3/uL (ref 0.1–1.0)
NEUTROS ABS: 2.6 10*3/uL (ref 1.7–7.7)
NEUTROS PCT: 74 %
Platelets: 128 10*3/uL — ABNORMAL LOW (ref 150–400)
RBC: 3.2 MIL/uL — ABNORMAL LOW (ref 3.87–5.11)
RDW: 19 % — ABNORMAL HIGH (ref 11.5–15.5)
WBC: 3.5 10*3/uL — ABNORMAL LOW (ref 4.0–10.5)

## 2018-05-29 MED ORDER — HEPARIN SOD (PORK) LOCK FLUSH 100 UNIT/ML IV SOLN
500.0000 [IU] | Freq: Once | INTRAVENOUS | Status: AC
  Administered 2018-05-29: 500 [IU]
  Filled 2018-05-29: qty 5

## 2018-05-29 MED ORDER — BOOST / RESOURCE BREEZE PO LIQD CUSTOM
1.0000 | Freq: Three times a day (TID) | ORAL | Status: DC
  Administered 2018-05-29: 1 via ORAL
  Filled 2018-05-29 (×2): qty 1

## 2018-05-29 MED ORDER — PRO-STAT SUGAR FREE PO LIQD
30.0000 mL | Freq: Two times a day (BID) | ORAL | Status: DC
  Administered 2018-05-29: 30 mL via ORAL
  Filled 2018-05-29: qty 30

## 2018-05-29 MED ORDER — SODIUM CHLORIDE 0.9 % IV BOLUS
2000.0000 mL | Freq: Once | INTRAVENOUS | Status: AC
  Administered 2018-05-29: 2000 mL via INTRAVENOUS

## 2018-05-29 NOTE — ED Triage Notes (Signed)
Pt c/o poor appetite and diarrhea x several days. Pt with recent chemo and radiation.

## 2018-05-29 NOTE — Discharge Instructions (Addendum)
Make sure you are getting plenty of rest and try to drink 1 to 2 L of water each day.  To help your nutritional status try to drink 2 or 3 cans of Ensure or boost every day.  Follow-up with your primary care doctor and oncologist, as needed.

## 2018-05-29 NOTE — ED Provider Notes (Signed)
[place] DEPT Provider Note   CSN: [id number] Arrival date & time: 05/29/18  8315     History   Chief Complaint Chief Complaint  Patient presents with  . Diarrhea    HPI Erin Kent is a 67 y.o. female.  HPI   She is here for evaluation of diarrhea, with recent chemotherapy infusion, 2 days ago.  She is being treated for metastatic cervical cancer.  She has had poor oral intake and diarrhea with prior episodes of chemotherapy.  This is been complicated by hypokalemia.  She has received iron infusions for anemia.  Symptoms are ongoing, and she does not have any exacerbation.  She is here with her caregiver who states she has trying to get the patient to eat and drink but the patient tends to avoid it because it causes more diarrhea.  Patient also stopped taking antidiarrheal medication, and does not like protein supplements which she has tried.  She has not had any fever, chills, cough, shortness of breath, chest pain, focal weakness or paresthesia.  There are no other no modifying factors.  Past Medical History:  Diagnosis Date  . Cervical cancer ([place])   . Iron deficiency anemia     Patient Active Problem List   Diagnosis Date Noted  . Acute hypokalemia 05/26/2018  . Other constipation 05/19/2018  . Iron deficiency anemia 05/05/2018  . Cachexia ([place]) 05/05/2018  . Vaginal tumor   . Malignant neoplasm of cervix ([place]) 04/23/2018  . OSTEOPOROSIS 10/10/2008  . HEMOCCULT POSITIVE STOOL 10/02/2008  . KYPHOSIS 10/02/2008  . DENTAL CARIES 05/24/2008  . 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 OVARIAN CYST, LEFT 10/14/2004    Past Surgical History:  Procedure Laterality Date  . CYSTOSCOPY N/A 04/28/2018   Procedure: CYSTOSCOPY;  Surgeon: Isabel Caprice, MD;  Location: [facility];  Service: Gynecology;  Laterality: N/A;  . EUA/ PAPSMEAR/  CERVICAL MASS BIOPSY  04-12-2018   dr peggy constant @[facility]   . IR IMAGING GUIDED PORT  INSERTION  05/12/2018  . WISDOM TOOTH EXTRACTION       OB History    Gravida  0   Para  0   Term  0   Preterm  0   AB  0   Living  0     SAB  0   TAB  0   Ectopic  0   Multiple  0   Live Births  0            Home Medications    Prior to Admission medications   Medication Sig Start Date End Date Taking? Authorizing Provider  lidocaine-prilocaine (EMLA) cream Apply to affected area once Patient taking differently: Apply 1 application topically as needed (for port acces).  05/10/18  Yes Gorsuch, Ni, MD  loperamide (IMODIUM A-D) 2 MG tablet Take 2 mg by mouth as needed for diarrhea or loose stools.   Yes [provider]  ondansetron (ZOFRAN) 8 MG tablet Take 1 tablet (8 mg total) by mouth every 8 (eight) hours as needed. Start on the third day after chemotherapy. 05/10/18  Yes [name], MD  prochlorperazine (COMPAZINE) 10 MG tablet Take 1 tablet (10 mg total) by mouth every 6 (six) hours as needed (Nausea or vomiting). 05/10/18  Yes Gorsuch, Ni, MD  ferrous sulfate (FERROUSUL) 325 (65 FE) MG tablet Take 1 tablet (325 mg total) by mouth 2 (two) times daily. Patient not taking: Reported on 05/29/2018 04/12/18   Constant, Vickii Chafe, MD  ibuprofen (ADVIL,MOTRIN) 600 MG tablet Take 1 tablet (600 mg total) by mouth every 6 (six) hours as needed. Patient not taking: Reported on 05/29/2018 04/12/18   Constant, Peggy, MD  oxyCODONE-acetaminophen (PERCOCET/ROXICET) 5-325 MG tablet Take 1-2 tablets by mouth every 6 (six) hours as needed. Patient not taking: Reported on 05/04/2018 04/12/18   Constant, Peggy, MD    Family History Family History  Problem Relation Age of Onset  . Hypertension Mother     Social History Social History   Tobacco Use  . Smoking status: Never Smoker  . Smokeless tobacco: Never Used  Substance Use Topics  . Alcohol use: No  . Drug use: No     Allergies   Patient has no known allergies.   Review of Systems Review of Systems  All other  systems reviewed and are negative.    Physical Exam Updated Vital Signs BP 126/65   Pulse 71   Temp 98.4 F (36.9 C) (Oral)   Resp 17   SpO2 100%   Physical Exam  Constitutional: She is oriented to person, place, and time. She appears well-developed.  Frail, appears older than stated age.  HENT:  Head: Normocephalic and atraumatic.  Eyes: Pupils are equal, round, and reactive to light. Conjunctivae and EOM are normal.  Neck: Normal range of motion and phonation normal. Neck supple.  Cardiovascular: Normal rate and regular rhythm.  Pulmonary/Chest: Effort normal and breath sounds normal. No stridor. No respiratory distress. She exhibits no tenderness.  Abdominal: Soft. She exhibits no distension. There is no tenderness. There is no guarding.  Musculoskeletal: Normal range of motion.  Neurological: She is alert and oriented to person, place, and time. She exhibits normal muscle tone.  Skin: Skin is warm and dry.  Psychiatric: She has a normal mood and affect. Her behavior is normal. Judgment and thought content normal.  Nursing note and vitals reviewed.    ED Treatments / Results  Labs (all labs ordered are listed, but only abnormal results are displayed) Labs Reviewed  COMPREHENSIVE METABOLIC PANEL - Abnormal; Notable for the following components:      Result Value   Potassium 3.3 (*)    Calcium 8.2 (*)    Total Protein 5.8 (*)    Albumin 3.1 (*)    All other components within normal limits  CBC WITH DIFFERENTIAL/PLATELET - Abnormal; Notable for the following components:   WBC 3.5 (*)    RBC 3.20 (*)    Hemoglobin 8.0 (*)    HCT 25.4 (*)    MCH 25.0 (*)    RDW 19.0 (*)    Platelets 128 (*)    Lymphs Abs 0.5 (*)    All other components within normal limits    EKG None  Radiology No results found.  Procedures Procedures (including critical care time)  Medications Ordered in ED Medications  feeding supplement (BOOST / RESOURCE BREEZE) liquid 1 Container (1  Container Oral Given 05/29/18 1016)  feeding supplement (PRO-STAT SUGAR FREE 64) liquid 30 mL (30 mLs Oral Given 05/29/18 1016)  sodium chloride 0.9 % bolus 2,000 mL (2,000 mLs Intravenous New Bag/Given 05/29/18 0933)     Initial Impression / Assessment and Plan / ED Course  I have reviewed the triage vital signs and the nursing notes.  Pertinent labs & imaging results that were available during my care of the patient were reviewed by me and considered in my medical decision making (see chart for details).  Clinical Course as of May 30 1199  Sun May 29, 2018  1115 Normal except potassium low, calcium low, total protein low, albumin low  Comprehensive metabolic panel(!) [EW]  5284 Normal except white count low, hemoglobin low, platelets low  CBC with Differential(!) [EW]    Clinical Course User Index [EW] Daleen Bo, MD     Patient Vitals for the past 24 hrs:  BP Temp Temp src Pulse Resp SpO2  05/29/18 1144 126/65 - - 71 17 100 %  05/29/18 1100 125/69 - - 68 16 100 %  05/29/18 1017 121/70 - - 73 18 100 %  05/29/18 0757 (!) 111/59 98.4 F (36.9 C) Oral 63 16 100 %    11:17 AM Reevaluation with update and discussion. After initial assessment and treatment, an updated evaluation reveals patient is returning from the bathroom now after voiding, walking briskly and looking bright.  She has tolerated oral nutritional supplements here.  She has no further complaints.  Findings discussed with patient and her caregiver, all questions were answered. Daleen Bo   Medical Decision Making: Malaise with anorexia, currently being treated for metastatic cervical cancer.  Mild suppression of blood cell lines, without frank pancytopenia.  No sign for focus of infection.  CRITICAL CARE-no Performed by: Daleen Bo Nursing Notes Reviewed/ Care Coordinated Applicable Imaging Reviewed Interpretation of Laboratory Data incorporated into ED treatment  The patient appears reasonably screened  and/or stabilized for discharge and I doubt any other medical condition or other [facility] requiring further screening, evaluation, or treatment in the ED at this time prior to discharge.  Plan: Home Medications-continue current medications; Home Treatments-increase oral nutrition and hydration; return here if the recommended treatment, does not improve the symptoms; Recommended follow up-PCP, PRN.  Continue current oncologic treatment   Final Clinical Impressions(s) / ED Diagnoses   Final diagnoses:  Malaise  Diarrhea, unspecified type  Malignant neoplasm of cervix, unspecified site [facility])    ED Discharge Orders    None       Daleen Bo, MD 05/29/18 1200

## 2018-05-30 ENCOUNTER — Encounter: Payer: Self-pay | Admitting: Hematology and Oncology

## 2018-05-30 ENCOUNTER — Telehealth: Payer: Self-pay

## 2018-05-30 ENCOUNTER — Ambulatory Visit
Admission: RE | Admit: 2018-05-30 | Discharge: 2018-05-30 | Disposition: A | Discharge status: Home / Self Care | Payer: Medicaid Other | Source: Ambulatory Visit | Attending: Radiation Oncology | Admitting: Radiation Oncology

## 2018-05-30 ENCOUNTER — Inpatient Hospital Stay (HOSPITAL_BASED_OUTPATIENT_CLINIC_OR_DEPARTMENT_OTHER): Payer: Medicaid Other | Admitting: Hematology and Oncology

## 2018-05-30 ENCOUNTER — Other Ambulatory Visit: Payer: Self-pay | Admitting: Hematology and Oncology

## 2018-05-30 ENCOUNTER — Inpatient Hospital Stay: Payer: Medicaid Other

## 2018-05-30 DIAGNOSIS — D509 Iron deficiency anemia, unspecified: Secondary | ICD-10-CM | POA: Diagnosis not present

## 2018-05-30 DIAGNOSIS — K5900 Constipation, unspecified: Secondary | ICD-10-CM

## 2018-05-30 DIAGNOSIS — C539 Malignant neoplasm of cervix uteri, unspecified: Secondary | ICD-10-CM

## 2018-05-30 DIAGNOSIS — E860 Dehydration: Secondary | ICD-10-CM

## 2018-05-30 DIAGNOSIS — E876 Hypokalemia: Secondary | ICD-10-CM

## 2018-05-30 DIAGNOSIS — Z79899 Other long term (current) drug therapy: Secondary | ICD-10-CM | POA: Diagnosis not present

## 2018-05-30 DIAGNOSIS — Z5111 Encounter for antineoplastic chemotherapy: Secondary | ICD-10-CM

## 2018-05-30 DIAGNOSIS — R6881 Early satiety: Secondary | ICD-10-CM

## 2018-05-30 DIAGNOSIS — D500 Iron deficiency anemia secondary to blood loss (chronic): Secondary | ICD-10-CM

## 2018-05-30 DIAGNOSIS — R64 Cachexia: Secondary | ICD-10-CM

## 2018-05-30 DIAGNOSIS — Z510 Encounter for antineoplastic radiation therapy: Secondary | ICD-10-CM | POA: Diagnosis not present

## 2018-05-30 DIAGNOSIS — I700 Atherosclerosis of aorta: Secondary | ICD-10-CM

## 2018-05-30 DIAGNOSIS — R197 Diarrhea, unspecified: Secondary | ICD-10-CM | POA: Insufficient documentation

## 2018-05-30 LAB — COMPREHENSIVE METABOLIC PANEL
ALBUMIN: 3.5 g/dL (ref 3.5–5.0)
ALT: 10 U/L (ref 0–44)
AST: 13 U/L — AB (ref 15–41)
Alkaline Phosphatase: 67 U/L (ref 38–126)
Anion gap: 10 (ref 5–15)
BUN: 8 mg/dL (ref 8–23)
CHLORIDE: 101 mmol/L (ref 98–111)
CO2: 28 mmol/L (ref 22–32)
Calcium: 8.8 mg/dL — ABNORMAL LOW (ref 8.9–10.3)
Creatinine, Ser: 0.7 mg/dL (ref 0.44–1.00)
GFR calc Af Amer: >60 mL/min (ref >60)
GFR calc non Af Amer: >60 mL/min (ref >60)
GLUCOSE: 93 mg/dL (ref 70–99)
POTASSIUM: 2.9 mmol/L — AB (ref 3.5–5.1)
Sodium: 139 mmol/L (ref 135–145)
Total Bilirubin: 0.2 mg/dL — ABNORMAL LOW (ref 0.3–1.2)
Total Protein: 6.5 g/dL (ref 6.5–8.1)

## 2018-05-30 LAB — CBC WITH DIFFERENTIAL/PLATELET
BASOS ABS: 0 10*3/uL (ref 0.0–0.1)
BASOS PCT: 0 %
EOS PCT: 4 %
Eosinophils Absolute: 0.2 10*3/uL (ref 0.0–0.5)
HCT: 29 % — ABNORMAL LOW (ref 34.8–46.6)
Hemoglobin: 9 g/dL — ABNORMAL LOW (ref 11.6–15.9)
Lymphocytes Relative: 14 %
Lymphs Abs: 0.7 10*3/uL — ABNORMAL LOW (ref 0.9–3.3)
MCH: 24.7 pg — ABNORMAL LOW (ref 25.1–34.0)
MCHC: 31 g/dL — ABNORMAL LOW (ref 31.5–36.0)
MCV: 79.7 fL (ref 79.5–101.0)
MONO ABS: 0.5 10*3/uL (ref 0.1–0.9)
Monocytes Relative: 11 %
Neutro Abs: 3.2 10*3/uL (ref 1.5–6.5)
Neutrophils Relative %: 71 %
PLATELETS: 123 10*3/uL — AB (ref 145–400)
RBC: 3.64 MIL/uL — ABNORMAL LOW (ref 3.70–5.45)
RDW: 19.6 % — AB (ref 11.2–14.5)
WBC: 4.5 10*3/uL (ref 3.9–10.3)

## 2018-05-30 LAB — SAMPLE TO BLOOD BANK

## 2018-05-30 LAB — MAGNESIUM: MAGNESIUM: 1.7 mg/dL (ref 1.7–2.4)

## 2018-05-30 LAB — PREPARE RBC (CROSSMATCH)

## 2018-05-30 LAB — ABO/RH: ABO/RH(D): O POS

## 2018-05-30 MED ORDER — DEXAMETHASONE 4 MG PO TABS: 4.0000 mg | ORAL_TABLET | Freq: Every day | ORAL | 0 refills | Status: DC

## 2018-05-30 NOTE — Assessment & Plan Note (Signed)
She has severe diarrhea likely due to side effects of treatment I recommend scheduled Imodium 4 mg 4 times a day and will reassess next week

## 2018-05-30 NOTE — Assessment & Plan Note (Addendum)
She has severe hypokalemia due to poor oral intake I recommend IV fluid support for the patient and I encouraged her to eat potassium rich diet I will try to add IV potassium when she presented for IV fluid hydration therapy daily

## 2018-05-30 NOTE — Assessment & Plan Note (Signed)
She has malignant cachexia with early satiety I recommend daily dexamethasone to stimulate appetite and IV fluid hydration to support her oral fluid intake

## 2018-05-30 NOTE — Telephone Encounter (Signed)
Called and talked with Erin Kent. Given appt for 0730 tomorrow for blood transfusion and do not remove blood bracelet. Instructed to go to scheduling tomorrow and get appts for IV fluids. She verbalized understanding.

## 2018-05-30 NOTE — Progress Notes (Signed)
[place] OFFICE PROGRESS NOTE  Patient Care Team: Patient, [name] as PCP - General (General Practice)  ASSESSMENT & PLAN:  Malignant neoplasm of cervix ([place]) She tolerated treatment poorly due to fatigue, progressive anemia, poor appetite and progressive weight loss I recommend aggressive supportive care including blood transfusion. I will continue to see her every week.  She will continue her treatment this week as scheduled  Iron deficiency anemia We discussed some of the risks, benefits, and alternatives of blood transfusions. The patient is symptomatic from anemia and the hemoglobin level is critically low.  Some of the side-effects to be expected including risks of transfusion reactions, chills, infection, syndrome of volume overload and risk of hospitalization from various reasons and the patient is willing to proceed and went ahead to sign consent today. Given the repeat CBC show mild improvement, overall, I felt that she is dehydrated and hence the hemoglobin appears somewhat higher. She has symptomatic anemia with profound fatigue and appears pale I will give her a unit of blood tomorrow. She will receive intravenous iron at the end of the week along with her chemotherapy.  Cachexia ([place]) She has malignant cachexia with early satiety I recommend daily dexamethasone to stimulate appetite and IV fluid hydration to support her oral fluid intake  Acute hypokalemia She has severe hypokalemia due to poor oral intake I recommend IV fluid support for the patient and I encouraged her to eat potassium rich diet I will try to add IV potassium when she presented for IV fluid hydration therapy daily  Diarrhea She has severe diarrhea likely due to side effects of treatment I recommend scheduled Imodium 4 mg 4 times a day and will reassess next week   Orders Placed This Encounter  Procedures  . Sample to Blood Bank    Standing Status:   Standing    Number of  Occurrences:   33    Standing Expiration Date:   05/31/2019  . Type and screen    Standing Status:   Future    Standing Expiration Date:   05/31/2019  . Prepare RBC    Standing Status:   Standing    Number of Occurrences:   1    Order Specific Question:   # of Units    Answer:   1 unit    Order Specific Question:   Transfusion Indications    Answer:   Symptomatic Anemia    Order Specific Question:   Special Requirements    Answer:   Irradiated    Order Specific Question:   If emergent release call blood bank    Answer:   Not emergent release  . Type and screen    INTERVAL HISTORY: Please see below for problem oriented charting. She returns with caregiver for further follow-up She complained of fatigue, poor oral intake and severe diarrhea. The patient denies any recent signs or symptoms of bleeding such as spontaneous epistaxis, hematuria or hematochezia. She denies nausea or vomiting. Even though she has not lost weight, her partner thought it could be related to her receiving 2 L of IV fluids in the emergency room She has not been taking Imodium scheduled. She denies recent fever, chills or cough. No peripheral neuropathy from treatment  SUMMARY OF ONCOLOGIC HISTORY:   Malignant neoplasm of cervix ([place])   03/13/2018 Imaging    US pelvis There are mobile echogenic foci within the endometrial cavity suggestive of gas. This is nonspecific in etiology however may be secondary  to endometritis. Correlate for history of recent instrumentation. This obscures visualization of the endometrial tissue and therefore a follow-up pelvic ultrasound in 2-4 weeks is recommended after resolution of the acute symptomatology if symptoms persist to further evaluate the endometrium.     03/13/2018 Initial Diagnosis    She initially presented with PMB    04/12/2018 Pathology Results    Cervix, biopsy, mass - INVASIVE SQUAMOUS CELL CARCINOMA - SEE COMMENT    04/12/2018 Surgery    PREOPERATIVE  DIAGNOSIS:  Postmenopausal vaginal bleeding. POSTOPERATIVE DIAGNOSIS: The same PROCEDURE: Exam under anesthesia, pap smear, cervical mass biopsy SURGEON:  Dr. [name]  INDICATIONS: 67 y.o. yo [id number] with PMB here for exam under anesthesia.Risks of surgery were discussed with the patient including but not limited to: bleeding which may require transfusion; infection which may require antibiotics; injury to uterus or surrounding organs; need for additional procedures including laparotomy or laparoscopy; and other postoperative/anesthesia complications. Written informed consent was obtained.    FINDINGS:  An 8-week size midline uterus.  No adnexal mass palpable on exam. Normal cervix not visualized. Friable mass seen in involving the vagina at the level of the cervix obliterating the anterior and posterior fornix.  ANESTHESIA:   General INTRAVENOUS FLUIDS:  300 ml of LR ESTIMATED BLOOD LOSS: 20 ml. SPECIMENS: pap smear, cervical mass biopsy COMPLICATIONS:  None immediate.     04/25/2018 PET scan    1. Large cervical mass may invade into the myometrium in down into the vagina, maximum SUV 21.3. There is a malignant left external iliac node measuring 1.4 cm in short axis with maximum SUV 14.1. 2. Accentuated activity in the cecum and ascending colon is likely physiologic given that it has no CT correlate. Correlation with the patient's colon cancer screening history is recommended. If screening is not up-to-date, appropriate screening should be considered. 3.  Aortic Atherosclerosis (ICD10-I70.0).     04/28/2018 Surgery    Pre-operative Diagnosis:  At least Stage 2B SCCa Cervical Cancer  Post-operative Diagnosis: At least clinical Stage 3A SCCa Cervical Cancer  Operation:  Exam under anesthesia due to intolerance for vaginal exam in office Cystoscopy due to concern for extension to posterior bladder (from anterior vaginal wall lesion)  Operative Findings:   1. Parametrial  involvement on right  2. Right sided subvaginal tumor burden ~3cm. This approximates the right ureteral insertion into the bladder based on my exam during cystoscopy. 3. Extension of disease down upper half of vagina on lateral and posterior walls. 4. Extension of disease down to lower third of anterior vagina (versus skip lesion) thus making her Stage 3A 5. Cystoscopy revealed patent bilateral ureteral orifices and no bladder mucosa invasion or areas of concern. 6. Rectal exam grossly no disease.     05/04/2018 Cancer Staging    Staging form: Cervix Uteri, AJCC 8th Edition - Clinical: Stage IIIB (cT3b, cN1, cM0) - Signed by [name], MD on 05/04/2018    05/12/2018 Procedure    Successful 8 French right internal jugular vein power port placement with its tip at the SVC/RA junction     REVIEW OF SYSTEMS:   Constitutional: Denies fevers, chills or abnormal weight loss Eyes: Denies blurriness of vision Ears, nose, mouth, throat, and face: Denies mucositis or sore throat Respiratory: Denies cough, dyspnea or wheezes Cardiovascular: Denies palpitation, chest discomfort or lower extremity swelling Skin: Denies abnormal skin rashes Lymphatics: Denies new lymphadenopathy or easy bruising Neurological:Denies numbness, tingling or new weaknesses Behavioral/Psych: Mood is stable, no new changes  All other systems were reviewed with the patient and are negative.  I have reviewed the past medical history, past surgical history, social history and family history with the patient and they are unchanged from previous note.  ALLERGIES:  has No Known Allergies.  MEDICATIONS:  Current Outpatient Medications  Medication Sig Dispense Refill  . dexamethasone (DECADRON) 4 MG tablet Take 1 tablet (4 mg total) by mouth daily. 30 tablet 0  . ferrous sulfate (FERROUSUL) 325 (65 FE) MG tablet Take 1 tablet (325 mg total) by mouth 2 (two) times daily. (Patient not taking: Reported on 05/29/2018) 60 tablet 1   . ibuprofen (ADVIL,MOTRIN) 600 MG tablet Take 1 tablet (600 mg total) by mouth every 6 (six) hours as needed. (Patient not taking: Reported on 05/29/2018) 60 tablet 3  . lidocaine-prilocaine (EMLA) cream Apply to affected area once (Patient taking differently: Apply 1 application topically as needed (for port acces). ) 30 g 3  . loperamide (IMODIUM A-D) 2 MG tablet Take 2 mg by mouth as needed for diarrhea or loose stools.    . ondansetron (ZOFRAN) 8 MG tablet Take 1 tablet (8 mg total) by mouth every 8 (eight) hours as needed. Start on the third day after chemotherapy. 30 tablet 1  . oxyCODONE-acetaminophen (PERCOCET/ROXICET) 5-325 MG tablet Take 1-2 tablets by mouth every 6 (six) hours as needed. (Patient not taking: Reported on 05/04/2018) 20 tablet 0  . prochlorperazine (COMPAZINE) 10 MG tablet Take 1 tablet (10 mg total) by mouth every 6 (six) hours as needed (Nausea or vomiting). 30 tablet 1   No current facility-administered medications for this visit.     PHYSICAL EXAMINATION: ECOG PERFORMANCE STATUS: 2 - Symptomatic, <50% confined to bed  Vitals:   05/30/18 1437  BP: 122/61  Pulse: 73  Resp: 18  Temp: 97.9 F (36.6 C)  SpO2: 100%   Filed Weights   05/30/18 1437  Weight: 85 lb 3.2 oz (38.6 kg)    GENERAL:alert, no distress and comfortable.  She looks thin and cachectic SKIN: skin color is pale, texture, turgor are normal, no rashes or significant lesions EYES: normal, Conjunctiva are pale and non-injected, sclera clear OROPHARYNX:no exudate, no erythema and lips, buccal mucosa, and tongue normal  NECK: supple, thyroid normal size, non-tender, without nodularity LYMPH:  no palpable lymphadenopathy in the cervical, axillary or inguinal LUNGS: clear to auscultation and percussion with normal breathing effort HEART: regular rate & rhythm and no murmurs and no lower extremity edema ABDOMEN:abdomen soft, non-tender and normal bowel sounds Musculoskeletal:no cyanosis of digits  and no clubbing  NEURO: alert & oriented x 3 with fluent speech, no focal motor/sensory deficits  LABORATORY DATA:  I have reviewed the data as listed    Component Value Date/Time   NA 139 05/30/2018 1545   K 2.9 (LL) 05/30/2018 1545   CL 101 05/30/2018 1545   CO2 28 05/30/2018 1545   GLUCOSE 93 05/30/2018 1545   BUN 8 05/30/2018 1545   CREATININE 0.70 05/30/2018 1545   CALCIUM 8.8 (L) 05/30/2018 1545   PROT 6.5 05/30/2018 1545   ALBUMIN 3.5 05/30/2018 1545   AST 13 (L) 05/30/2018 1545   ALT 10 05/30/2018 1545   ALKPHOS 67 05/30/2018 1545   BILITOT 0.2 (L) 05/30/2018 1545   GFRNONAA >60 05/30/2018 1545   GFRAA >60 05/30/2018 1545    No results found for: SPEP, UPEP  Lab Results  Component Value Date   WBC 4.5 05/30/2018   NEUTROABS 3.2 05/30/2018   HGB  9.0 (L) 05/30/2018   HCT 29.0 (L) 05/30/2018   MCV 79.7 05/30/2018   PLT 123 (L) 05/30/2018      Chemistry      Component Value Date/Time   NA 139 05/30/2018 1545   K 2.9 (LL) 05/30/2018 1545   CL 101 05/30/2018 1545   CO2 28 05/30/2018 1545   BUN 8 05/30/2018 1545   CREATININE 0.70 05/30/2018 1545      Component Value Date/Time   CALCIUM 8.8 (L) 05/30/2018 1545   ALKPHOS 67 05/30/2018 1545   AST 13 (L) 05/30/2018 1545   ALT 10 05/30/2018 1545   BILITOT 0.2 (L) 05/30/2018 1545       RADIOGRAPHIC STUDIES: I have personally reviewed the radiological images as listed and agreed with the findings in the report. Ir Imaging Guided Port Insertion  Result Date: 05/12/2018 CLINICAL DATA:  Cervical cancer EXAM: TUNNEL POWER PORT PLACEMENT WITH SUBCUTANEOUS POCKET UTILIZING ULTRASOUND & FLOUROSCOPY FLUOROSCOPY TIME:  54 seconds.  Three mGy. MEDICATIONS AND MEDICAL HISTORY: Versed 2 mg, Fentanyl 100 mcg. Additional Medications: Ancef 2 g. Antibiotics were given within 2 hours of the procedure. ANESTHESIA/SEDATION: Moderate sedation time: 34 minutes. Nursing monitored the the patient during the procedure. PROCEDURE:  After written informed consent was obtained, patient was placed in the supine position on angiographic table. The right neck and chest was prepped and draped in a sterile fashion. Lidocaine was utilized for local anesthesia. The right jugular vein was noted to be patent initially with ultrasound. Under sonographic guidance, a micropuncture needle was inserted into the right IJ vein (Ultrasound and fluoroscopic image documentation was performed). The needle was removed over an 018 wire which was exchanged for a Amplatz. This was advanced into the IVC. An 8-French dilator was advanced over the Amplatz. A small incision was made in the right upper chest over the anterior right second rib. Utilizing blunt dissection, a subcutaneous pocket was created in the caudal direction. The pocket was irrigated with a copious amount of sterile normal saline. The port catheter was tunneled from the chest incision, and out the neck incision. The reservoir was inserted into the subcutaneous pocket and secured with two 3-0 Ethilon stitches. A peel-away sheath was advanced over the Amplatz wire. The port catheter was cut to measure length and inserted through the peel-away sheath. The peel-away sheath was removed. The chest incision was closed with 3-0 Vicryl interrupted stitches for the subcutaneous tissue and a running of 4-0 Vicryl subcuticular stitch for the skin. The neck incision was closed with a 4-0 Vicryl subcuticular stitch. Derma-bond was applied to both surgical incisions. The port reservoir was flushed and instilled with heparinized saline. No complications. FINDINGS: A right IJ vein Port-A-Cath is in place with its tip at the cavoatrial junction. COMPLICATIONS: None IMPRESSION: Successful 8 French right internal jugular vein power port placement with its tip at the SVC/RA junction. Electronically Signed   By: Marybelle Killings M.D.   On: 05/12/2018 15:08    All questions were answered. The patient knows to call the clinic  with any problems, questions or concerns. No barriers to learning was detected.  I spent 30 minutes counseling the patient face to face. The total time spent in the appointment was 40 minutes and more than 50% was on counseling and review of test results  [name], MD 05/30/2018 5:00 PM

## 2018-05-30 NOTE — Assessment & Plan Note (Signed)
She tolerated treatment poorly due to fatigue, progressive anemia, poor appetite and progressive weight loss I recommend aggressive supportive care including blood transfusion. I will continue to see her every week.  She will continue her treatment this week as scheduled

## 2018-05-30 NOTE — Telephone Encounter (Signed)
Left VM for caregiver that pt does not need to see Dr. Sondra Come for weekly undertreatment visit until Wednesday. This RN's direct number left on VM. Loma Sousa, RN BSN

## 2018-05-30 NOTE — Telephone Encounter (Signed)
Called and left a message.

## 2018-05-30 NOTE — Assessment & Plan Note (Signed)
We discussed some of the risks, benefits, and alternatives of blood transfusions. The patient is symptomatic from anemia and the hemoglobin level is critically low.  Some of the side-effects to be expected including risks of transfusion reactions, chills, infection, syndrome of volume overload and risk of hospitalization from various reasons and the patient is willing to proceed and went ahead to sign consent today. Given the repeat CBC show mild improvement, overall, I felt that she is dehydrated and hence the hemoglobin appears somewhat higher. She has symptomatic anemia with profound fatigue and appears pale I will give her a unit of blood tomorrow. She will receive intravenous iron at the end of the week along with her chemotherapy.

## 2018-05-31 ENCOUNTER — Inpatient Hospital Stay: Payer: Medicaid Other

## 2018-05-31 ENCOUNTER — Ambulatory Visit
Admission: RE | Admit: 2018-05-31 | Discharge: 2018-05-31 | Disposition: A | Discharge status: Home / Self Care | Payer: Medicaid Other | Source: Ambulatory Visit | Attending: Radiation Oncology | Admitting: Radiation Oncology

## 2018-05-31 VITALS — BP 119/71 | HR 63 | Temp 98.0°F | Resp 16

## 2018-05-31 DIAGNOSIS — Z510 Encounter for antineoplastic radiation therapy: Secondary | ICD-10-CM | POA: Diagnosis not present

## 2018-05-31 DIAGNOSIS — C539 Malignant neoplasm of cervix uteri, unspecified: Secondary | ICD-10-CM

## 2018-05-31 DIAGNOSIS — D500 Iron deficiency anemia secondary to blood loss (chronic): Secondary | ICD-10-CM

## 2018-05-31 DIAGNOSIS — Z5111 Encounter for antineoplastic chemotherapy: Secondary | ICD-10-CM | POA: Diagnosis not present

## 2018-05-31 MED ORDER — DIPHENHYDRAMINE HCL 25 MG PO CAPS
ORAL_CAPSULE | ORAL | Status: AC
  Filled 2018-05-31: qty 1

## 2018-05-31 MED ORDER — SODIUM CHLORIDE 0.9% FLUSH
10.0000 mL | INTRAVENOUS | Status: AC | PRN
  Administered 2018-05-31: 10 mL
  Filled 2018-05-31: qty 10

## 2018-05-31 MED ORDER — ACETAMINOPHEN 325 MG PO TABS
650.0000 mg | ORAL_TABLET | Freq: Once | ORAL | Status: AC
  Administered 2018-05-31: 650 mg via ORAL

## 2018-05-31 MED ORDER — SODIUM CHLORIDE 0.9% IV SOLUTION
250.0000 mL | Freq: Once | INTRAVENOUS | Status: AC
  Administered 2018-05-31: 250 mL via INTRAVENOUS
  Filled 2018-05-31: qty 250

## 2018-05-31 MED ORDER — HEPARIN SOD (PORK) LOCK FLUSH 100 UNIT/ML IV SOLN
250.0000 [IU] | INTRAVENOUS | Status: DC | PRN
  Filled 2018-05-31: qty 5

## 2018-05-31 MED ORDER — HEPARIN SOD (PORK) LOCK FLUSH 100 UNIT/ML IV SOLN
500.0000 [IU] | Freq: Once | INTRAVENOUS | Status: AC
  Administered 2018-05-31: 500 [IU]
  Filled 2018-05-31: qty 5

## 2018-05-31 MED ORDER — ACETAMINOPHEN 325 MG PO TABS
ORAL_TABLET | ORAL | Status: AC
  Filled 2018-05-31: qty 2

## 2018-05-31 MED ORDER — DIPHENHYDRAMINE HCL 25 MG PO CAPS
25.0000 mg | ORAL_CAPSULE | Freq: Once | ORAL | Status: AC
  Administered 2018-05-31: 25 mg via ORAL

## 2018-05-31 NOTE — Patient Instructions (Signed)

## 2018-06-01 ENCOUNTER — Inpatient Hospital Stay: Payer: Medicaid Other

## 2018-06-01 ENCOUNTER — Ambulatory Visit
Admission: RE | Admit: 2018-06-01 | Discharge: 2018-06-01 | Disposition: A | Discharge status: Home / Self Care | Payer: Medicaid Other | Source: Ambulatory Visit | Attending: Radiation Oncology | Admitting: Radiation Oncology

## 2018-06-01 VITALS — BP 119/53 | HR 79 | Temp 97.9°F | Resp 16

## 2018-06-01 DIAGNOSIS — Z510 Encounter for antineoplastic radiation therapy: Secondary | ICD-10-CM | POA: Diagnosis not present

## 2018-06-01 DIAGNOSIS — Z5111 Encounter for antineoplastic chemotherapy: Secondary | ICD-10-CM | POA: Diagnosis not present

## 2018-06-01 DIAGNOSIS — C539 Malignant neoplasm of cervix uteri, unspecified: Secondary | ICD-10-CM

## 2018-06-01 LAB — BPAM RBC
BLOOD PRODUCT EXPIRATION DATE: 201910142359
ISSUE DATE / TIME: 201909170824
Unit Type and Rh: 5100

## 2018-06-01 LAB — TYPE AND SCREEN
ABO/RH(D): O POS
Antibody Screen: NEGATIVE
UNIT DIVISION: 0

## 2018-06-01 MED ORDER — SODIUM CHLORIDE 0.9% FLUSH
10.0000 mL | Freq: Once | INTRAVENOUS | Status: AC | PRN
  Administered 2018-06-01: 10 mL
  Filled 2018-06-01: qty 10

## 2018-06-01 MED ORDER — HEPARIN SOD (PORK) LOCK FLUSH 100 UNIT/ML IV SOLN
500.0000 [IU] | Freq: Once | INTRAVENOUS | Status: AC | PRN
  Administered 2018-06-01: 500 [IU]
  Filled 2018-06-01: qty 5

## 2018-06-01 MED ORDER — SODIUM CHLORIDE 0.9 % IV SOLN
Freq: Once | INTRAVENOUS | Status: AC
  Administered 2018-06-01: 08:00:00 via INTRAVENOUS
  Filled 2018-06-01: qty 1000

## 2018-06-01 NOTE — Patient Instructions (Signed)
Dehydration, Adult Dehydration is a condition in which there is not enough fluid or water in the body. This happens when you lose more fluids than you take in. Important organs, such as the kidneys, brain, and heart, cannot function without a proper amount of fluids. Any loss of fluids from the body can lead to dehydration. Dehydration can range from mild to severe. This condition should be treated right away to prevent it from becoming severe. What are the causes? This condition may be caused by:  Vomiting.  Diarrhea.  Excessive sweating, such as from heat exposure or exercise.  Not drinking enough fluid, especially: ? When ill. ? While doing activity that requires a lot of energy.  Excessive urination.  Fever.  Infection.  Certain medicines, such as medicines that cause the body to lose excess fluid (diuretics).  Inability to access safe drinking water.  Reduced physical ability to get adequate water and food.  What increases the risk? This condition is more likely to develop in people:  Who have a poorly controlled long-term (chronic) illness, such as diabetes, heart disease, or kidney disease.  Who are age 65 or older.  Who are disabled.  Who live in a place with high altitude.  Who play endurance sports.  What are the signs or symptoms? Symptoms of mild dehydration may include:  Thirst.  Dry lips.  Slightly dry mouth.  Dry, warm skin.  Dizziness. Symptoms of moderate dehydration may include:  Very dry mouth.  Muscle cramps.  Dark urine. Urine may be the color of tea.  Decreased urine production.  Decreased tear production.  Heartbeat that is irregular or faster than normal (palpitations).  Headache.  Light-headedness, especially when you stand up from a sitting position.  Fainting (syncope). Symptoms of severe dehydration may include:  Changes in skin, such as: ? Cold and clammy skin. ? Blotchy (mottled) or pale skin. ? Skin that does  not quickly return to normal after being lightly pinched and released (poor skin turgor).  Changes in body fluids, such as: ? Extreme thirst. ? No tear production. ? Inability to sweat when body temperature is high, such as in hot weather. ? Very little urine production.  Changes in vital signs, such as: ? Weak pulse. ? Pulse that is more than 100 beats a minute when sitting still. ? Rapid breathing. ? Low blood pressure.  Other changes, such as: ? Sunken eyes. ? Cold hands and feet. ? Confusion. ? Lack of energy (lethargy). ? Difficulty waking up from sleep. ? Short-term weight loss. ? Unconsciousness. How is this diagnosed? This condition is diagnosed based on your symptoms and a physical exam. Blood and urine tests may be done to help confirm the diagnosis. How is this treated? Treatment for this condition depends on the severity. Mild or moderate dehydration can often be treated at home. Treatment should be started right away. Do not wait until dehydration becomes severe. Severe dehydration is an emergency and it needs to be treated in a hospital. Treatment for mild dehydration may include:  Drinking more fluids.  Replacing salts and minerals in your blood (electrolytes) that you may have lost. Treatment for moderate dehydration may include:  Drinking an oral rehydration solution (ORS). This is a drink that helps you replace fluids and electrolytes (rehydrate). It can be found at pharmacies and retail stores. Treatment for severe dehydration may include:  Receiving fluids through an IV tube.  Receiving an electrolyte solution through a feeding tube that is passed through your nose   and into your stomach (nasogastric tube, or NG tube).  Correcting any abnormalities in electrolytes.  Treating the underlying cause of dehydration. Follow these instructions at home:  If directed by your health care provider, drink an ORS: ? Make an ORS by following instructions on the  package. ? Start by drinking small amounts, about  cup (120 mL) every 5-10 minutes. ? Slowly increase how much you drink until you have taken the amount recommended by your health care provider.  Drink enough clear fluid to keep your urine clear or pale yellow. If you were told to drink an ORS, finish the ORS first, then start slowly drinking other clear fluids. Drink fluids such as: ? Water. Do not drink only water. Doing that can lead to having too little salt (sodium) in the body (hyponatremia). ? Ice chips. ? Fruit juice that you have added water to (diluted fruit juice). ? Low-calorie sports drinks.  Avoid: ? Alcohol. ? Drinks that contain a lot of sugar. These include high-calorie sports drinks, fruit juice that is not diluted, and soda. ? Caffeine. ? Foods that are greasy or contain a lot of fat or sugar.  Take over-the-counter and prescription medicines only as told by your health care provider.  Do not take sodium tablets. This can lead to having too much sodium in the body (hypernatremia).  Eat foods that contain a healthy balance of electrolytes, such as bananas, oranges, potatoes, tomatoes, and spinach.  Keep all follow-up visits as told by your health care provider. This is important. Contact a health care provider if:  You have abdominal pain that: ? Gets worse. ? Stays in one area (localizes).  You have a rash.  You have a stiff neck.  You are more irritable than usual.  You are sleepier or more difficult to wake up than usual.  You feel weak or dizzy.  You feel very thirsty.  You have urinated only a small amount of very dark urine over 6-8 hours. Get help right away if:  You have symptoms of severe dehydration.  You cannot drink fluids without vomiting.  Your symptoms get worse with treatment.  You have a fever.  You have a severe headache.  You have vomiting or diarrhea that: ? Gets worse. ? Does not go away.  You have blood or green matter  (bile) in your vomit.  You have blood in your stool. This may cause stool to look black and tarry.  You have not urinated in 6-8 hours.  You faint.  Your heart rate while sitting still is over 100 beats a minute.  You have trouble breathing. This information is not intended to replace advice given to you by your health care provider. Make sure you discuss any questions you have with your health care provider. Document Released: 08/31/2005 Document Revised: 03/27/2016 Document Reviewed: 10/25/2015 Elsevier Interactive Patient Education  2018 [employer]  

## 2018-06-02 ENCOUNTER — Inpatient Hospital Stay: Payer: Medicaid Other

## 2018-06-02 ENCOUNTER — Ambulatory Visit
Admission: RE | Admit: 2018-06-02 | Discharge: 2018-06-02 | Disposition: A | Discharge status: Home / Self Care | Payer: Medicaid Other | Source: Ambulatory Visit | Attending: Radiation Oncology | Admitting: Radiation Oncology

## 2018-06-02 VITALS — BP 132/56 | HR 59 | Temp 97.8°F | Resp 17

## 2018-06-02 DIAGNOSIS — Z510 Encounter for antineoplastic radiation therapy: Secondary | ICD-10-CM | POA: Diagnosis not present

## 2018-06-02 DIAGNOSIS — Z5111 Encounter for antineoplastic chemotherapy: Secondary | ICD-10-CM | POA: Diagnosis not present

## 2018-06-02 DIAGNOSIS — C539 Malignant neoplasm of cervix uteri, unspecified: Secondary | ICD-10-CM

## 2018-06-02 DIAGNOSIS — D500 Iron deficiency anemia secondary to blood loss (chronic): Secondary | ICD-10-CM

## 2018-06-02 LAB — CBC WITH DIFFERENTIAL/PLATELET
BASOS ABS: 0 10*3/uL (ref 0.0–0.1)
BASOS PCT: 0 %
Eosinophils Absolute: 0.3 10*3/uL (ref 0.0–0.5)
Eosinophils Relative: 8 %
HEMATOCRIT: 32.3 % — AB (ref 34.8–46.6)
Hemoglobin: 10.5 g/dL — ABNORMAL LOW (ref 11.6–15.9)
Lymphocytes Relative: 9 %
Lymphs Abs: 0.4 10*3/uL — ABNORMAL LOW (ref 0.9–3.3)
MCH: 25.7 pg (ref 25.1–34.0)
MCHC: 32.5 g/dL (ref 31.5–36.0)
MCV: 79.1 fL — ABNORMAL LOW (ref 79.5–101.0)
MONO ABS: 0.5 10*3/uL (ref 0.1–0.9)
Monocytes Relative: 12 %
NEUTROS ABS: 2.9 10*3/uL (ref 1.5–6.5)
NEUTROS PCT: 71 %
Platelets: 116 10*3/uL — ABNORMAL LOW (ref 145–400)
RBC: 4.08 MIL/uL (ref 3.70–5.45)
RDW: 22.3 % — AB (ref 11.2–14.5)
WBC: 4.1 10*3/uL (ref 3.9–10.3)

## 2018-06-02 LAB — COMPREHENSIVE METABOLIC PANEL
ALBUMIN: 3.3 g/dL — AB (ref 3.5–5.0)
ALK PHOS: 59 U/L (ref 38–126)
ALT: 7 U/L (ref 0–44)
ANION GAP: 8 (ref 5–15)
AST: 11 U/L — ABNORMAL LOW (ref 15–41)
BILIRUBIN TOTAL: 0.3 mg/dL (ref 0.3–1.2)
BUN: 10 mg/dL (ref 8–23)
CALCIUM: 8.6 mg/dL — AB (ref 8.9–10.3)
CO2: 28 mmol/L (ref 22–32)
Chloride: 104 mmol/L (ref 98–111)
Creatinine, Ser: 0.66 mg/dL (ref 0.44–1.00)
GLUCOSE: 83 mg/dL (ref 70–99)
POTASSIUM: 3.2 mmol/L — AB (ref 3.5–5.1)
Sodium: 140 mmol/L (ref 135–145)
TOTAL PROTEIN: 6.1 g/dL — AB (ref 6.5–8.1)

## 2018-06-02 LAB — MAGNESIUM: MAGNESIUM: 1.7 mg/dL (ref 1.7–2.4)

## 2018-06-02 LAB — SAMPLE TO BLOOD BANK

## 2018-06-02 MED ORDER — HEPARIN SOD (PORK) LOCK FLUSH 100 UNIT/ML IV SOLN
500.0000 [IU] | Freq: Once | INTRAVENOUS | Status: AC | PRN
  Administered 2018-06-02: 500 [IU]
  Filled 2018-06-02: qty 5

## 2018-06-02 MED ORDER — SODIUM CHLORIDE 0.9% FLUSH
10.0000 mL | Freq: Once | INTRAVENOUS | Status: AC | PRN
  Administered 2018-06-02: 10 mL
  Filled 2018-06-02: qty 10

## 2018-06-02 MED ORDER — SODIUM CHLORIDE 0.9 % IV SOLN
Freq: Once | INTRAVENOUS | Status: AC
  Administered 2018-06-02: 15:00:00 via INTRAVENOUS
  Filled 2018-06-02: qty 250

## 2018-06-02 MED ORDER — SODIUM CHLORIDE 0.9 % IV SOLN
Freq: Once | INTRAVENOUS | Status: AC
  Administered 2018-06-02: 15:00:00 via INTRAVENOUS
  Filled 2018-06-02: qty 1000

## 2018-06-02 NOTE — Patient Instructions (Addendum)
Dehydration, Adult Dehydration is when there is not enough fluid or water in your body. This happens when you lose more fluids than you take in. Dehydration can range from mild to very bad. It should be treated right away to keep it from getting very bad. Symptoms of mild dehydration may include:  Thirst.  Dry lips.  Slightly dry mouth.  Dry, warm skin.  Dizziness. Symptoms of moderate dehydration may include:  Very dry mouth.  Muscle cramps.  Dark pee (urine). Pee may be the color of tea.  Your body making less pee.  Your eyes making fewer tears.  Heartbeat that is uneven or faster than normal (palpitations).  Headache.  Light-headedness, especially when you stand up from sitting.  Fainting (syncope). Symptoms of very bad dehydration may include:  Changes in skin, such as: ? Cold and clammy skin. ? Blotchy (mottled) or pale skin. ? Skin that does not quickly return to normal after being lightly pinched and let go (poor skin turgor).  Changes in body fluids, such as: ? Feeling very thirsty. ? Your eyes making fewer tears. ? Not sweating when body temperature is high, such as in hot weather. ? Your body making very little pee.  Changes in vital signs, such as: ? Weak pulse. ? Pulse that is more than 100 beats a minute when you are sitting still. ? Fast breathing. ? Low blood pressure.  Other changes, such as: ? Sunken eyes. ? Cold hands and feet. ? Confusion. ? Lack of energy (lethargy). ? Trouble waking up from sleep. ? Short-term weight loss. ? Unconsciousness. Follow these instructions at home:  If told by your doctor, drink an ORS: ? Make an ORS by using instructions on the package. ? Start by drinking small amounts, about  cup (120 mL) every 5-10 minutes. ? Slowly drink more until you have had the amount that your doctor said to have.  Drink enough clear fluid to keep your pee clear or pale yellow. If you were told to drink an ORS, finish the ORS  first, then start slowly drinking clear fluids. Drink fluids such as: ? Water. Do not drink only water by itself. Doing that can make the salt (sodium) level in your body get too low (hyponatremia). ? Ice chips. ? Fruit juice that you have added water to (diluted). ? Low-calorie sports drinks.  Avoid: ? Alcohol. ? Drinks that have a lot of sugar. These include high-calorie sports drinks, fruit juice that does not have water added, and soda. ? Caffeine. ? Foods that are greasy or have a lot of fat or sugar.  Take over-the-counter and prescription medicines only as told by your doctor.  Do not take salt tablets. Doing that can make the salt level in your body get too high (hypernatremia).  Eat foods that have minerals (electrolytes). Examples include bananas, oranges, potatoes, tomatoes, and spinach.  Keep all follow-up visits as told by your doctor. This is important. Contact a doctor if:  You have belly (abdominal) pain that: ? Gets worse. ? Stays in one area (localizes).  You have a rash.  You have a stiff neck.  You get angry or annoyed more easily than normal (irritability).  You are more sleepy than normal.  You have a harder time waking up than normal.  You feel: ? Weak. ? Dizzy. ? Very thirsty.  You have peed (urinated) only a small amount of very dark pee during 6-8 hours. Get help right away if:  You have symptoms of   very bad dehydration.  You cannot drink fluids without throwing up (vomiting).  Your symptoms get worse with treatment.  You have a fever.  You have a very bad headache.  You are throwing up or having watery poop (diarrhea) and it: ? Gets worse. ? Does not go away.  You have blood or something green (bile) in your throw-up.  You have blood in your poop (stool). This may cause poop to look black and tarry.  You have not peed in 6-8 hours.  You pass out (faint).  Your heart rate when you are sitting still is more than 100 beats a  minute.  You have trouble breathing. This information is not intended to replace advice given to you by your health care provider. Make sure you discuss any questions you have with your health care provider. Document Released: 06/27/2009 Document Revised: 03/20/2016 Document Reviewed: 10/25/2015 Elsevier Interactive Patient Education  2018 [employer]  Dehydration, Adult Dehydration is when there is not enough fluid or water in your body. This happens when you lose more fluids than you take in. Dehydration can range from mild to very bad. It should be treated right away to keep it from getting very bad. Symptoms of mild dehydration may include:  Thirst.  Dry lips.  Slightly dry mouth.  Dry, warm skin.  Dizziness. Symptoms of moderate dehydration may include:  Very dry mouth.  Muscle cramps.  Dark pee (urine). Pee may be the color of tea.  Your body making less pee.  Your eyes making fewer tears.  Heartbeat that is uneven or faster than normal (palpitations).  Headache.  Light-headedness, especially when you stand up from sitting.  Fainting (syncope). Symptoms of very bad dehydration may include:  Changes in skin, such as: ? Cold and clammy skin. ? Blotchy (mottled) or pale skin. ? Skin that does not quickly return to normal after being lightly pinched and let go (poor skin turgor).  Changes in body fluids, such as: ? Feeling very thirsty. ? Your eyes making fewer tears. ? Not sweating when body temperature is high, such as in hot weather. ? Your body making very little pee.  Changes in vital signs, such as: ? Weak pulse. ? Pulse that is more than 100 beats a minute when you are sitting still. ? Fast breathing. ? Low blood pressure.  Other changes, such as: ? Sunken eyes. ? Cold hands and feet. ? Confusion. ? Lack of energy (lethargy). ? Trouble waking up from sleep. ? Short-term weight loss. ? Unconsciousness. Follow these instructions at  home:  If told by your doctor, drink an ORS: ? Make an ORS by using instructions on the package. ? Start by drinking small amounts, about  cup (120 mL) every 5-10 minutes. ? Slowly drink more until you have had the amount that your doctor said to have.  Drink enough clear fluid to keep your pee clear or pale yellow. If you were told to drink an ORS, finish the ORS first, then start slowly drinking clear fluids. Drink fluids such as: ? Water. Do not drink only water by itself. Doing that can make the salt (sodium) level in your body get too low (hyponatremia). ? Ice chips. ? Fruit juice that you have added water to (diluted). ? Low-calorie sports drinks.  Avoid: ? Alcohol. ? Drinks that have a lot of sugar. These include high-calorie sports drinks, fruit juice that does not have water added, and soda. ? Caffeine. ? Foods that are greasy or have   a lot of fat or sugar.  Take over-the-counter and prescription medicines only as told by your doctor.  Do not take salt tablets. Doing that can make the salt level in your body get too high (hypernatremia).  Eat foods that have minerals (electrolytes). Examples include bananas, oranges, potatoes, tomatoes, and spinach.  Keep all follow-up visits as told by your doctor. This is important. Contact a doctor if:  You have belly (abdominal) pain that: ? Gets worse. ? Stays in one area (localizes).  You have a rash.  You have a stiff neck.  You get angry or annoyed more easily than normal (irritability).  You are more sleepy than normal.  You have a harder time waking up than normal.  You feel: ? Weak. ? Dizzy. ? Very thirsty.  You have peed (urinated) only a small amount of very dark pee during 6-8 hours. Get help right away if:  You have symptoms of very bad dehydration.  You cannot drink fluids without throwing up (vomiting).  Your symptoms get worse with treatment.  You have a fever.  You have a very bad headache.  You  are throwing up or having watery poop (diarrhea) and it: ? Gets worse. ? Does not go away.  You have blood or something green (bile) in your throw-up.  You have blood in your poop (stool). This may cause poop to look black and tarry.  You have not peed in 6-8 hours.  You pass out (faint).  Your heart rate when you are sitting still is more than 100 beats a minute.  You have trouble breathing. This information is not intended to replace advice given to you by your health care provider. Make sure you discuss any questions you have with your health care provider. Document Released: 06/27/2009 Document Revised: 03/20/2016 Document Reviewed: 10/25/2015 Elsevier Interactive Patient Education  2018 [employer]  

## 2018-06-03 ENCOUNTER — Emergency Department (HOSPITAL_COMMUNITY)
Admission: EM | Admit: 2018-06-03 | Discharge: 2018-06-05 | Disposition: A | Discharge status: Home / Self Care | Payer: Medicaid Other | Attending: Emergency Medicine | Admitting: Emergency Medicine

## 2018-06-03 ENCOUNTER — Ambulatory Visit
Admission: RE | Admit: 2018-06-03 | Discharge: 2018-06-03 | Disposition: A | Discharge status: Home / Self Care | Payer: Medicaid Other | Source: Ambulatory Visit | Attending: Radiation Oncology | Admitting: Radiation Oncology

## 2018-06-03 ENCOUNTER — Other Ambulatory Visit: Payer: Self-pay

## 2018-06-03 ENCOUNTER — Encounter: Payer: Self-pay | Admitting: General Practice

## 2018-06-03 ENCOUNTER — Inpatient Hospital Stay: Payer: Medicaid Other

## 2018-06-03 ENCOUNTER — Ambulatory Visit: Payer: Medicaid Other

## 2018-06-03 ENCOUNTER — Encounter: Payer: Self-pay | Admitting: Adult Health

## 2018-06-03 ENCOUNTER — Encounter (HOSPITAL_COMMUNITY): Payer: Self-pay | Admitting: Emergency Medicine

## 2018-06-03 ENCOUNTER — Inpatient Hospital Stay (HOSPITAL_BASED_OUTPATIENT_CLINIC_OR_DEPARTMENT_OTHER): Payer: Medicaid Other | Admitting: Adult Health

## 2018-06-03 VITALS — BP 138/74 | HR 60 | Temp 97.8°F | Resp 18

## 2018-06-03 DIAGNOSIS — Y9389 Activity, other specified: Secondary | ICD-10-CM | POA: Diagnosis not present

## 2018-06-03 DIAGNOSIS — Z5111 Encounter for antineoplastic chemotherapy: Secondary | ICD-10-CM

## 2018-06-03 DIAGNOSIS — C539 Malignant neoplasm of cervix uteri, unspecified: Secondary | ICD-10-CM

## 2018-06-03 DIAGNOSIS — F321 Major depressive disorder, single episode, moderate: Secondary | ICD-10-CM | POA: Diagnosis not present

## 2018-06-03 DIAGNOSIS — M810 Age-related osteoporosis without current pathological fracture: Secondary | ICD-10-CM

## 2018-06-03 DIAGNOSIS — Z7289 Other problems related to lifestyle: Secondary | ICD-10-CM

## 2018-06-03 DIAGNOSIS — E876 Hypokalemia: Secondary | ICD-10-CM

## 2018-06-03 DIAGNOSIS — R6881 Early satiety: Secondary | ICD-10-CM

## 2018-06-03 DIAGNOSIS — Z79899 Other long term (current) drug therapy: Secondary | ICD-10-CM | POA: Insufficient documentation

## 2018-06-03 DIAGNOSIS — X788XXA Intentional self-harm by other sharp object, initial encounter: Secondary | ICD-10-CM | POA: Diagnosis not present

## 2018-06-03 DIAGNOSIS — F4325 Adjustment disorder with mixed disturbance of emotions and conduct: Secondary | ICD-10-CM | POA: Diagnosis not present

## 2018-06-03 DIAGNOSIS — Y999 Unspecified external cause status: Secondary | ICD-10-CM | POA: Diagnosis not present

## 2018-06-03 DIAGNOSIS — R45851 Suicidal ideations: Secondary | ICD-10-CM | POA: Insufficient documentation

## 2018-06-03 DIAGNOSIS — I700 Atherosclerosis of aorta: Secondary | ICD-10-CM

## 2018-06-03 DIAGNOSIS — R197 Diarrhea, unspecified: Secondary | ICD-10-CM

## 2018-06-03 DIAGNOSIS — R627 Adult failure to thrive: Secondary | ICD-10-CM | POA: Diagnosis present

## 2018-06-03 DIAGNOSIS — R64 Cachexia: Secondary | ICD-10-CM

## 2018-06-03 DIAGNOSIS — K5900 Constipation, unspecified: Secondary | ICD-10-CM

## 2018-06-03 DIAGNOSIS — D509 Iron deficiency anemia, unspecified: Secondary | ICD-10-CM | POA: Diagnosis not present

## 2018-06-03 DIAGNOSIS — Z510 Encounter for antineoplastic radiation therapy: Secondary | ICD-10-CM | POA: Diagnosis not present

## 2018-06-03 DIAGNOSIS — E860 Dehydration: Secondary | ICD-10-CM

## 2018-06-03 DIAGNOSIS — F489 Nonpsychotic mental disorder, unspecified: Secondary | ICD-10-CM | POA: Insufficient documentation

## 2018-06-03 DIAGNOSIS — R400 Somnolence: Secondary | ICD-10-CM

## 2018-06-03 DIAGNOSIS — Y92018 Other place in single-family (private) house as the place of occurrence of the external cause: Secondary | ICD-10-CM | POA: Diagnosis not present

## 2018-06-03 LAB — ACETAMINOPHEN LEVEL

## 2018-06-03 LAB — BASIC METABOLIC PANEL
Anion gap: 7 (ref 5–15)
BUN: 8 mg/dL (ref 8–23)
CALCIUM: 8.4 mg/dL — AB (ref 8.9–10.3)
CO2: 25 mmol/L (ref 22–32)
CREATININE: 0.49 mg/dL (ref 0.44–1.00)
Chloride: 107 mmol/L (ref 98–111)
Glucose, Bld: 70 mg/dL (ref 70–99)
Potassium: 3.1 mmol/L — ABNORMAL LOW (ref 3.5–5.1)
SODIUM: 139 mmol/L (ref 135–145)

## 2018-06-03 LAB — CBC
HCT: 31 % — ABNORMAL LOW (ref 36.0–46.0)
Hemoglobin: 10 g/dL — ABNORMAL LOW (ref 12.0–15.0)
MCH: 25.8 pg — ABNORMAL LOW (ref 26.0–34.0)
MCHC: 32.3 g/dL (ref 30.0–36.0)
MCV: 80.1 fL (ref 78.0–100.0)
PLATELETS: 96 10*3/uL — AB (ref 150–400)
RBC: 3.87 MIL/uL (ref 3.87–5.11)
RDW: 19.5 % — AB (ref 11.5–15.5)
WBC: 3.2 10*3/uL — AB (ref 4.0–10.5)

## 2018-06-03 LAB — SALICYLATE LEVEL: Salicylate Lvl: <7 mg/dL (ref 2.8–30.0)

## 2018-06-03 MED ORDER — TRAZODONE HCL 50 MG PO TABS
50.0000 mg | ORAL_TABLET | Freq: Every day | ORAL | Status: DC
  Administered 2018-06-03 – 2018-06-04 (×2): 50 mg via ORAL
  Filled 2018-06-03 (×2): qty 1

## 2018-06-03 MED ORDER — POTASSIUM CHLORIDE 2 MEQ/ML IV SOLN
Freq: Once | INTRAVENOUS | Status: AC
  Administered 2018-06-03: 11:00:00 via INTRAVENOUS
  Filled 2018-06-03: qty 10

## 2018-06-03 MED ORDER — SODIUM CHLORIDE 0.9% FLUSH
10.0000 mL | INTRAVENOUS | Status: DC | PRN
  Filled 2018-06-03: qty 10

## 2018-06-03 MED ORDER — HEPARIN SOD (PORK) LOCK FLUSH 100 UNIT/ML IV SOLN
500.0000 [IU] | Freq: Once | INTRAVENOUS | Status: DC | PRN
  Filled 2018-06-03: qty 5

## 2018-06-03 MED ORDER — HYDROXYZINE HCL 25 MG PO TABS: 25.0000 mg | ORAL_TABLET | Freq: Three times a day (TID) | ORAL | Status: DC | PRN

## 2018-06-03 MED ORDER — SODIUM CHLORIDE 0.9 % IV SOLN
Freq: Once | INTRAVENOUS | Status: AC
  Administered 2018-06-03: 10:00:00 via INTRAVENOUS
  Filled 2018-06-03: qty 250

## 2018-06-03 MED ORDER — POTASSIUM CHLORIDE CRYS ER 20 MEQ PO TBCR
40.0000 meq | EXTENDED_RELEASE_TABLET | Freq: Once | ORAL | Status: AC
  Administered 2018-06-03: 40 meq via ORAL
  Filled 2018-06-03: qty 2

## 2018-06-03 MED ORDER — SERTRALINE HCL 20 MG/ML PO CONC
25.0000 mg | Freq: Every day | ORAL | Status: DC
  Administered 2018-06-03 – 2018-06-05 (×3): 25 mg via ORAL
  Filled 2018-06-03 (×3): qty 1.25

## 2018-06-03 NOTE — ED Triage Notes (Signed)
Per patient, states she doesn't know why when she eats or drinks her bowels "react"-states she cut her right forearm because she doesn't know what's going on with her body-denies SI currently

## 2018-06-03 NOTE — BH Assessment (Signed)
Assessment Note  Erin Kent is an 67 y.o. female that presents this date with S/I. Patient denies any H/I or AVH. Patient reports thoughts of self harm and "cut herself" with her shaving razor earlier this date although patient denies that she was trying to take her life. Patient denies any previous attempts/gestures at self harm. Patient reports she was diagnosed with cervical cancer three months ago and has been depressed since since with symptoms to include: excessive fatigue, guilt and anhedonia. Patient denies any previous mental health issues or prior inpatient admissions associated with self harm. Patient speaks in a low soft voice and is noted to be tearful at times during the assessment. Patient reports she resides with her caretaker and lacks any other social support system. Per notes, "Patient presents to the ED with her caregiver after intentional self-injury that occurred this morning. Patient's caregiver states patient informed her this morning that she had cut herself with her razor on her right forearm. Patient states she cut herself because she was thinking about her ongoing diarrhea and feels like her body is starving her. She has been having difficulty with controlling diarrhea, which is due to her current cancer treatment. Patient presents to the ED with thoughts of ending her life, and is unsure if she will try to harm herself again in the future". Patient denies any history of SA use and is oriented x 4. Patient is requesting to be evaluated for medication management to assist with symptoms. Case was staffed with Romilda Garret FNP who recommended patient be observed and monitored for safety. Patient will also be evaluated for possible medication interventions. Patient will be seen by psychiatry in the a.m.       Diagnosis: F43.25 Adjustment disorder, With mixed disturbance of emotions and conduct.   Past Medical History:  Past Medical History:  Diagnosis Date  . Cervical cancer ([place])   .  Iron deficiency anemia     Past Surgical History:  Procedure Laterality Date  . CYSTOSCOPY N/A 04/28/2018   Procedure: CYSTOSCOPY;  Surgeon: Isabel Caprice, MD;  Location: [facility];  Service: Gynecology;  Laterality: N/A;  . EUA/ PAPSMEAR/  CERVICAL MASS BIOPSY  04-12-2018   dr peggy constant @[facility]   . IR IMAGING GUIDED PORT INSERTION  05/12/2018  . WISDOM TOOTH EXTRACTION      Family History:  Family History  Problem Relation Age of Onset  . Hypertension Mother     Social History:  reports that she has never smoked. She has never used smokeless tobacco. She reports that she does not drink alcohol or use drugs.  Additional Social History:  Alcohol / Drug Use Pain Medications: See MAR Prescriptions: See MAR Over the Counter: See MAR History of alcohol / drug use?: No history of alcohol / drug abuse Longest period of sobriety (when/how long): NA Negative Consequences of Use: (NA) Withdrawal Symptoms: (NA)  CIWA: CIWA-Ar BP: (!) 161/67 Pulse Rate: (!) 57 COWS:    Allergies: No Known Allergies  Home Medications:  (Not in a hospital admission)  OB/GYN Status:  No LMP recorded. Patient is postmenopausal.  General Assessment Data [facility] of Assessment: [facility] TTS Assessment: In system Is this a Tele or Face-to-Face Assessment?: Face-to-Face Is this an Initial Assessment or a Re-assessment for this encounter?: Initial Assessment Patient Accompanied by:: (NA) Language Other than English: No Living Arrangements: (with caregiver) What gender do you identify as?: Female Marital status: Single Maiden name: Cappelletti Pregnancy Status: No Living Arrangements: Other (Comment)(with  caregiver) Can pt return to current living arrangement?: Yes Admission Status: Voluntary Is patient capable of signing voluntary admission?: Yes Referral Source: Self/Family/Friend Insurance type: Medicaid  Medical Screening Exam ([place]) Medical Exam completed:  Yes  Crisis Care Plan Living Arrangements: Other (Comment)(with caregiver) Legal Guardian: (NA) Name of Psychiatrist: None Name of Therapist: None  Education Status Is patient currently in school?: No Is the patient employed, unemployed or receiving disability?: Unemployed  Risk to self with the past 6 months Suicidal Ideation: Yes-Currently Present Has patient been a risk to self within the past 6 months prior to admission? : No Suicidal Intent: Yes-Currently Present Has patient had any suicidal intent within the past 6 months prior to admission? : No Is patient at risk for suicide?: Yes Suicidal Plan?: No Has patient had any suicidal plan within the past 6 months prior to admission? : No Access to Means: No What has been your use of drugs/alcohol within the last 12 months?: Denies Previous Attempts/Gestures: No How many times?: 0 Other Self Harm Risks: (NA) Triggers for Past Attempts: Unknown Intentional Self Injurious Behavior: None Family Suicide History: No Recent stressful life event(s): (Recent health issues ) Persecutory voices/beliefs?: No Depression: Yes Depression Symptoms: Fatigue, Loss of interest in usual pleasures Substance abuse history and/or treatment for substance abuse?: No Suicide prevention information given to non-admitted patients: Not applicable  Risk to Others within the past 6 months Homicidal Ideation: No Does patient have any lifetime risk of violence toward others beyond the six months prior to admission? : No Thoughts of Harm to Others: No Current Homicidal Intent: No Current Homicidal Plan: No Access to Homicidal Means: No Identified Victim: NA History of harm to others?: No Assessment of Violence: None Noted Violent Behavior Description: NA Does patient have access to weapons?: No Criminal Charges Pending?: No Does patient have a court date: No Is patient on probation?: No  Psychosis Hallucinations: None noted Delusions: None  noted  Mental Status Report Appearance/Hygiene: In scrubs Eye Contact: Fair Motor Activity: Freedom of movement Speech: Soft, Slow Level of Consciousness: Drowsy Mood: Depressed Affect: Sad Anxiety Level: Moderate Thought Processes: Coherent, Relevant Judgement: Partial Orientation: Person, Place, Time Obsessive Compulsive Thoughts/Behaviors: None  Cognitive Functioning Concentration: Good Memory: Recent Intact, Remote Intact Is patient IDD: No Insight: Good Impulse Control: Fair Appetite: Fair Have you had any weight changes? : No Change Sleep: Decreased Total Hours of Sleep: 4 Vegetative Symptoms: None  ADLScreening [facility] Assessment Services) Patient's cognitive ability adequate to safely complete daily activities?: Yes Patient able to express need for assistance with ADLs?: Yes Independently performs ADLs?: Yes (appropriate for developmental age)  Prior Inpatient Therapy Prior Inpatient Therapy: No  Prior Outpatient Therapy Prior Outpatient Therapy: No Does patient have an ACCT team?: No Does patient have Intensive In-House Services?  : No Does patient have [facility] services? : No Does patient have P4CC services?: No  ADL Screening (condition at time of admission) Patient's cognitive ability adequate to safely complete daily activities?: Yes Is the patient deaf or have difficulty hearing?: No Does the patient have difficulty seeing, even when wearing glasses/contacts?: No Does the patient have difficulty concentrating, remembering, or making decisions?: No Patient able to express need for assistance with ADLs?: Yes Does the patient have difficulty dressing or bathing?: No Independently performs ADLs?: Yes (appropriate for developmental age) Does the patient have difficulty walking or climbing stairs?: No Weakness of Legs: None Weakness of Arms/Hands: None  Home Assistive Devices/Equipment Home Assistive Devices/Equipment: None  Therapy  Consults (therapy  consults require a physician order) PT Evaluation Needed: No OT Evalulation Needed: No SLP Evaluation Needed: No Abuse/Neglect Assessment (Assessment to be complete while patient is alone) Physical Abuse: Denies Verbal Abuse: Denies Sexual Abuse: Denies Exploitation of patient/patient's resources: Denies Self-Neglect: Denies Values / Beliefs Cultural Requests During Hospitalization: None Spiritual Requests During Hospitalization: None Consults Spiritual Care Consult Needed: No Social Work Consult Needed: No Regulatory affairs officer (For Healthcare) Does Patient Have a Medical Advance Directive?: No Would patient like information on creating a medical advance directive?: No - Patient declined          Disposition:  Case was staffed with Romilda Garret FNP who recommended patient be observed and monitored for safety. Patient will also be evaluated for possible medication interventions. Patient will be seen by psychiatry in the a.m. Disposition Initial Assessment Completed for this Encounter: Yes Disposition of Patient: (Observe and monitor) Patient refused recommended treatment: No Mode of transportation if patient is discharged?: (Unk)  On Site Evaluation by:   Reviewed with Physician:    Mamie Nick 06/03/2018 5:04 PM

## 2018-06-03 NOTE — Progress Notes (Signed)
[facility] Spiritual Care Note  Followed up with Erin Kent in [place] to bring her a handwritten birthday card and the latest copy of her favorite magazine, which she lamented not getting to purchase in order to enjoy on her birthday yesterday. Visited with caregiver/housemate Allen Norris [phone]) at bedside, as well. [facility] Support Team will follow up next week, but please page [facility] chaplain if needs arise during the weekend. Thank you.   [place], [place], [facility] M-F daytime pager [phone] [facility] 24/7 pager [phone] Voicemail [phone]

## 2018-06-03 NOTE — ED Notes (Signed)
Per cancer center, states patient finished her infusion and staff noticed some superficial lacerations-confronted patient and patient stated she was having suicidal thoughts

## 2018-06-03 NOTE — Progress Notes (Signed)
[facility] Spiritual Care Note  Today Ms Erin Kent's caregiver/housemate Fannie Bennett ([phone]) presented to Support Center to report that she found that pt had superficial razor blade cuts on her right forearm this morning. Per caregiver Ms Bennett, she sanitized and dressed pt's wounds and brought her to [facility] for radiation and chemo treatments as scheduled. Ms Bennett wanted to bring the self-harm injury to the attention of [facility] staff for further support.   Met also with Ms Koenig in infusion to assess what clinical interventions were needed. Pt states that she does "NOTwant to die," but that her "morale just got really bad" yesterday due the pressures of treatment and disease--which also compromised her ability/opportunity to celebrate her birthday yesterday. Pt cites housemate's four cats as a significant protective factor, because she loves to watch and play with them, and two came to check on her in the bathroom (where she had the razor) to show their care and concern.   Pt is agreeable to proceeding directly to [facility] following chemo treatment for further SI assessment, psych support, and resources for coping and mood stabilization. Pt has 1:1 NT sitter during current infusion per arrangement by Melanie Rogers/RN, assistant nursing director in Infusion.  Consulted also with Lauren Somers/LCSW, Lindsey Causey/NP, and Chelsea/RN (pt's chemo nurse) to coordinate assessment and care per protocol. Consulted with Kinsie/LCSW in [facility] for referral; she plans to notify the second-shift LCSW re planned ED visit ca 4pm this afternoon in order to promote continuity of care.  [facility] Support Team plans to f/u with Ms Mcclaran next week to develop rapport and assess needs that [facility] Support Center can address.  [facility] Chaplain  , MDiv, [facility] M-F daytime pager [phone] [facility] 24/7 pager [phone] Voicemail [phone]     Chaplain  , MDiv, [facility] Pager [phone] Voicemail [phone] 

## 2018-06-03 NOTE — Assessment & Plan Note (Signed)
Erin Kent is a 67 year old woman who I am evaluating in the treatment room due to self harm.  Based on her vague answers to my questions while talking to her, the history of self harm this morning, her decreased urine output, and her increased somnolence, it is very challenging to determine what is going on with her.  Due to this, we will hold treatment today, and go ahead and send her to ER for full psych evaluation and work up.     The above was reviewed with Dr. Lindi Adie in detail and he is in agreement.

## 2018-06-03 NOTE — Progress Notes (Signed)
Upon assessment of patient this morning, this RN noted several large band-aids on patient's right anterior forearm. When asked, pt stated "I have some scratches there." This nurse asked to change the dressing on pt's arm and pt agreeable. Upon removal of band-aids, this RN noted two vertical lacerations. When asked what happened, pt stated "I was shaving the hair on my arms and cut it." RN noted the lacerations did appear to be made with a razor. RN notified Assistant Director Laurence Compton and Dr. Calton Dach nurse May, RN. Lorrin Jackson to infusion room to speak with RN in lieu of social work. Patient verbalized understanding of the conversations between this RN, Baltazar Apo, and May, RN. Lattie Haw in to speak with patient. Safety sitter 1:1 provided to patient for remainder of day. Pt agreeable to go to the Emergency Department post treatment for evaluation and safety per Lattie Haw.   Will continue to monitor.    1245; per Dr. Lindi Adie, pt to be transferred to ED r/t decreased urine output and increased solemnance at this time and tx canceled. Pt vocalized understanding and agreement. Caregiver [name] called by NT [place] and informed pt on the way to the ED; per Talbot Grumbling will meet pt in ED.

## 2018-06-03 NOTE — BH Assessment (Signed)
[facility] Assessment Progress Note  Case was staffed with Romilda Garret FNP who recommended patient be observed and monitored for safety. Patient will also be evaluated for possible medication interventions. Patient will be seen by psychiatry in the a.m.

## 2018-06-03 NOTE — Progress Notes (Signed)
[place] Cancer Follow up:    Patient, [name] No address on file   DIAGNOSIS: Cancer Staging Malignant neoplasm of cervix ([place]) Staging form: Cervix Uteri, AJCC 8th Edition - Clinical: Stage IIIB (cT3b, cN1, cM0) - Signed by [name], MD on 05/04/2018   SUMMARY OF ONCOLOGIC HISTORY:   Malignant neoplasm of cervix ([place])   03/13/2018 Imaging    US pelvis There are mobile echogenic foci within the endometrial cavity suggestive of gas. This is nonspecific in etiology however may be secondary to endometritis. Correlate for history of recent instrumentation. This obscures visualization of the endometrial tissue and therefore a follow-up pelvic ultrasound in 2-4 weeks is recommended after resolution of the acute symptomatology if symptoms persist to further evaluate the endometrium.     03/13/2018 Initial Diagnosis    She initially presented with PMB    04/12/2018 Pathology Results    Cervix, biopsy, mass - INVASIVE SQUAMOUS CELL CARCINOMA - SEE COMMENT    04/12/2018 Surgery    PREOPERATIVE DIAGNOSIS:  Postmenopausal vaginal bleeding. POSTOPERATIVE DIAGNOSIS: The same PROCEDURE: Exam under anesthesia, pap smear, cervical mass biopsy SURGEON:  Dr. [name]  INDICATIONS: 67 y.o. yo [id number] with PMB here for exam under anesthesia.Risks of surgery were discussed with the patient including but not limited to: bleeding which may require transfusion; infection which may require antibiotics; injury to uterus or surrounding organs; need for additional procedures including laparotomy or laparoscopy; and other postoperative/anesthesia complications. Written informed consent was obtained.    FINDINGS:  An 8-week size midline uterus.  No adnexal mass palpable on exam. Normal cervix not visualized. Friable mass seen in involving the vagina at the level of the cervix obliterating the anterior and posterior fornix.  ANESTHESIA:   General INTRAVENOUS FLUIDS:  300 ml of  LR ESTIMATED BLOOD LOSS: 20 ml. SPECIMENS: pap smear, cervical mass biopsy COMPLICATIONS:  None immediate.     04/25/2018 PET scan    1. Large cervical mass may invade into the myometrium in down into the vagina, maximum SUV 21.3. There is a malignant left external iliac node measuring 1.4 cm in short axis with maximum SUV 14.1. 2. Accentuated activity in the cecum and ascending colon is likely physiologic given that it has no CT correlate. Correlation with the patient's colon cancer screening history is recommended. If screening is not up-to-date, appropriate screening should be considered. 3.  Aortic Atherosclerosis (ICD10-I70.0).     04/28/2018 Surgery    Pre-operative Diagnosis:  At least Stage 2B SCCa Cervical Cancer  Post-operative Diagnosis: At least clinical Stage 3A SCCa Cervical Cancer  Operation:  Exam under anesthesia due to intolerance for vaginal exam in office Cystoscopy due to concern for extension to posterior bladder (from anterior vaginal wall lesion)  Operative Findings:   1. Parametrial involvement on right  2. Right sided subvaginal tumor burden ~3cm. This approximates the right ureteral insertion into the bladder based on my exam during cystoscopy. 3. Extension of disease down upper half of vagina on lateral and posterior walls. 4. Extension of disease down to lower third of anterior vagina (versus skip lesion) thus making her Stage 3A 5. Cystoscopy revealed patent bilateral ureteral orifices and no bladder mucosa invasion or areas of concern. 6. Rectal exam grossly no disease.     05/04/2018 Cancer Staging    Staging form: Cervix Uteri, AJCC 8th Edition - Clinical: Stage IIIB (cT3b, cN1, cM0) - Signed by [name], MD on 05/04/2018    05/12/2018 Procedure  Successful 8 French right internal jugular vein power port placement with its tip at the SVC/RA junction     CURRENT THERAPY: Cisplatin weekly  INTERVAL HISTORY: Charda Janis Sherr 67 y.o. female  returns today to receive her chemotherapy.  Her nurse noted razor cuts on her arm.  I am seeing her today in Dr. Calton Dach absence.  Patient has been seen by our chaplain Lorrin Jackson, as Clinical Social Work is not on site today.  Leticia and I talked for about 30 minutes.  I asked her several direct questions about depression, to determine her level of depression.  These were directly from the [place].  She was very vague in her answers and would not give me any definite responses.  She does tell me that she has been down since her cervical cancer diagnosis and that she lives at home with her friend fannie who helps take care of her.  [place] tells me that she was thinking negative thoughts and then cut her arm with a razor for no particular reason.  She kept her eyes closed during our conversation, and was very sluggish to respond.  She tells me that she has not taken any medications to harm herself.  Nursing reports that after starting fluids, that her urine output is minimal, and is not where it needs to be to start chemotherapy.     Patient Active Problem List   Diagnosis Date Noted  . Diarrhea 05/30/2018  . Acute hypokalemia 05/26/2018  . Other constipation 05/19/2018  . Iron deficiency anemia 05/05/2018  . Cachexia ([place]) 05/05/2018  . Vaginal tumor   . Malignant neoplasm of cervix ([place]) 04/23/2018  . OSTEOPOROSIS 10/10/2008  . HEMOCCULT POSITIVE STOOL 10/02/2008  . KYPHOSIS 10/02/2008  . DENTAL CARIES 05/24/2008  . 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 OVARIAN CYST, LEFT 10/14/2004    has No Known Allergies.  MEDICAL HISTORY: Past Medical History:  Diagnosis Date  . Cervical cancer ([place])   . Iron deficiency anemia     SURGICAL HISTORY: Past Surgical History:  Procedure Laterality Date  . CYSTOSCOPY N/A 04/28/2018   Procedure: CYSTOSCOPY;  Surgeon: Isabel Caprice, MD;  Location: [facility];  Service: Gynecology;  Laterality: N/A;  . EUA/ PAPSMEAR/   CERVICAL MASS BIOPSY  04-12-2018   dr peggy constant @[facility]   . IR IMAGING GUIDED PORT INSERTION  05/12/2018  . WISDOM TOOTH EXTRACTION      SOCIAL HISTORY: Social History   Socioeconomic History  . Marital status: Single    Spouse name: Not on file  . Number of children: 0  . Years of education: Not on file  . Highest education level: Not on file  Occupational History  . Not on file  Social Needs  . Financial resource strain: Not on file  . Food insecurity:    Worry: Not on file    Inability: Not on file  . Transportation needs:    Medical: Not on file    Non-medical: Not on file  Tobacco Use  . Smoking status: Never Smoker  . Smokeless tobacco: Never Used  Substance and Sexual Activity  . Alcohol use: No  . Drug use: No  . Sexual activity: Not Currently    Birth control/protection: None  Lifestyle  . Physical activity:    Days per week: Not on file    Minutes per session: Not on file  . Stress: Not on file  Relationships  . Social connections:    Talks on phone: Not  on file    Gets together: Not on file    Attends religious service: Not on file    Active member of club or organization: Not on file    Attends meetings of clubs or organizations: Not on file    Relationship status: Not on file  . Intimate partner violence:    Fear of current or ex partner: Not on file    Emotionally abused: Not on file    Physically abused: Not on file    Forced sexual activity: Not on file  Other Topics Concern  . Not on file  Social History Narrative  . Not on file    FAMILY HISTORY: Family History  Problem Relation Age of Onset  . Hypertension Mother     ROS: Unable to assess due to increased somnolence/lethargy   PHYSICAL EXAMINATION  ECOG PERFORMANCE STATUS: 2 - Symptomatic, <50% confined to bed  There were no vitals filed for this visit. See [facility] for vitals  Physical Exam  Constitutional: She appears well-developed and well-nourished.  HENT:  Head:  Normocephalic and atraumatic.  Mouth/Throat: Oropharynx is clear and moist. No oropharyngeal exudate.  Eyes: No scleral icterus.  Cardiovascular: Normal rate, regular rhythm and normal heart sounds.  Pulmonary/Chest: Effort normal and breath sounds normal.  Abdominal: Soft. Bowel sounds are normal.  Lymphadenopathy:    She has no cervical adenopathy.  Skin: Skin is warm and dry.  Psychiatric:  Patient somnolent drifting off during conversation    LABORATORY DATA:  CBC    Component Value Date/Time   WBC 4.1 06/02/2018 1201   RBC 4.08 06/02/2018 1201   HGB 10.5 (L) 06/02/2018 1201   HCT 32.3 (L) 06/02/2018 1201   PLT 116 (L) 06/02/2018 1201   MCV 79.1 (L) 06/02/2018 1201   MCH 25.7 06/02/2018 1201   MCHC 32.5 06/02/2018 1201   RDW 22.3 (H) 06/02/2018 1201   LYMPHSABS 0.4 (L) 06/02/2018 1201   MONOABS 0.5 06/02/2018 1201   EOSABS 0.3 06/02/2018 1201   BASOSABS 0.0 06/02/2018 1201    CMP     Component Value Date/Time   NA 140 06/02/2018 1201   K 3.2 (L) 06/02/2018 1201   CL 104 06/02/2018 1201   CO2 28 06/02/2018 1201   GLUCOSE 83 06/02/2018 1201   BUN 10 06/02/2018 1201   CREATININE 0.66 06/02/2018 1201   CALCIUM 8.6 (L) 06/02/2018 1201   PROT 6.1 (L) 06/02/2018 1201   ALBUMIN 3.3 (L) 06/02/2018 1201   AST 11 (L) 06/02/2018 1201   ALT 7 06/02/2018 1201   ALKPHOS 59 06/02/2018 1201   BILITOT 0.3 06/02/2018 1201   GFRNONAA >60 06/02/2018 1201   GFRAA >60 06/02/2018 1201      ASSESSMENT and THERAPY PLAN:   Malignant neoplasm of cervix ([place]) Lucrezia is a 67 year old woman who I am evaluating in the treatment room due to self harm.  Based on her vague answers to my questions while talking to her, the history of self harm this morning, her decreased urine output, and her increased somnolence, it is very challenging to determine what is going on with her.  Due to this, we will hold treatment today, and go ahead and send her to ER for full psych evaluation and work up.      The above was reviewed with Dr. Lindi Adie in detail and he is in agreement.    All questions were answered. The patient knows to call the clinic with any problems, questions or concerns. We  can certainly see the patient much sooner if necessary.  A total of (30) minutes of face-to-face time was spent with this patient with greater than 50% of that time in counseling and care-coordination.  This note was electronically signed. Scot Dock, NP 06/03/2018

## 2018-06-03 NOTE — ED Provider Notes (Signed)
[facility] DEPT Provider Note   CSN: [id number] Arrival date & time: 06/03/18  1322     History   Chief Complaint Chief Complaint  Patient presents with  . Suicidal  . Failure To Thrive    HPI Erin Kent is a 67 y.o. female w PMHx metastatic cervical cancer currently undergoing rad/chemo therapy, presenting to the ED with her caregiver after intentional self-injury that occurred this morning. Pt's caregiver states pt informed her this morning that she had cut herself with her razor on her right forearm. Pt states she cut herself because she was thinking about her ongoing diarrhea and feels like her body is starving her. She has been having difficulty with controlling diarrhea, which is due to her current cancer treatment. She has been taking imodium without relief. She states that anything she eats or drinks causes her to have diarrhea. This has caused her to feel down. She denies active thoughts in the ED of ending her life, and is unsure if she will try to harm herself again in the future. She denies abdominal pain, nausea, vomiting, fever.  Recent labs drawn yesterday.  The history is provided by the patient and a caregiver.    Past Medical History:  Diagnosis Date  . Cervical cancer ([place])   . Iron deficiency anemia     Patient Active Problem List   Diagnosis Date Noted  . Diarrhea 05/30/2018  . Acute hypokalemia 05/26/2018  . Other constipation 05/19/2018  . Iron deficiency anemia 05/05/2018  . Cachexia ([place]) 05/05/2018  . Vaginal tumor   . Malignant neoplasm of cervix ([place]) 04/23/2018  . OSTEOPOROSIS 10/10/2008  . HEMOCCULT POSITIVE STOOL 10/02/2008  . KYPHOSIS 10/02/2008  . DENTAL CARIES 05/24/2008  . 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 OVARIAN CYST, LEFT 10/14/2004    Past Surgical History:  Procedure Laterality Date  . CYSTOSCOPY N/A 04/28/2018   Procedure: CYSTOSCOPY;  Surgeon: Isabel Caprice, MD;  Location: [facility];  Service: Gynecology;  Laterality: N/A;  . EUA/ PAPSMEAR/  CERVICAL MASS BIOPSY  04-12-2018   dr peggy constant @[facility]   . IR IMAGING GUIDED PORT INSERTION  05/12/2018  . WISDOM TOOTH EXTRACTION       OB History    Gravida  0   Para  0   Term  0   Preterm  0   AB  0   Living  0     SAB  0   TAB  0   Ectopic  0   Multiple  0   Live Births  0            Home Medications    Prior to Admission medications   Medication Sig Start Date End Date Taking? Authorizing Provider  loperamide (IMODIUM A-D) 2 MG tablet Take 2 mg by mouth as needed for diarrhea or loose stools.   Yes [provider]  dexamethasone (DECADRON) 4 MG tablet Take 1 tablet (4 mg total) by mouth daily. 05/30/18   [name], MD  ferrous sulfate (FERROUSUL) 325 (65 FE) MG tablet Take 1 tablet (325 mg total) by mouth 2 (two) times daily. Patient not taking: Reported on 05/29/2018 04/12/18   Constant, Peggy, MD  ibuprofen (ADVIL,MOTRIN) 600 MG tablet Take 1 tablet (600 mg total) by mouth every 6 (six) hours as needed. Patient not taking: Reported on 05/29/2018 04/12/18   Constant, Peggy, MD  lidocaine-prilocaine (EMLA) cream Apply to affected area once Patient taking differently: Apply 1  application topically as needed (for port acces).  05/10/18   [name], MD  ondansetron (ZOFRAN) 8 MG tablet Take 1 tablet (8 mg total) by mouth every 8 (eight) hours as needed. Start on the third day after chemotherapy. 05/10/18   [name], MD  oxyCODONE-acetaminophen (PERCOCET/ROXICET) 5-325 MG tablet Take 1-2 tablets by mouth every 6 (six) hours as needed. Patient not taking: Reported on 05/04/2018 04/12/18   Constant, Peggy, MD  prochlorperazine (COMPAZINE) 10 MG tablet Take 1 tablet (10 mg total) by mouth every 6 (six) hours as needed (Nausea or vomiting). Patient not taking: Reported on 06/03/2018 05/10/18   [name], MD    Family History Family History  Problem Relation Age of Onset  .  Hypertension Mother     Social History Social History   Tobacco Use  . Smoking status: Never Smoker  . Smokeless tobacco: Never Used  Substance Use Topics  . Alcohol use: No  . Drug use: No     Allergies   Patient has no known allergies.   Review of Systems Review of Systems  Constitutional: Negative for fever.  Gastrointestinal: Positive for diarrhea. Negative for abdominal pain, constipation, nausea and vomiting.  Allergic/Immunologic: Positive for immunocompromised state.  Psychiatric/Behavioral: Positive for dysphoric mood, self-injury and suicidal ideas. The patient is not nervous/anxious.   All other systems reviewed and are negative.    Physical Exam Updated Vital Signs BP (!) 117/58 (BP Location: Right Arm)   Pulse 64   Temp 98.2 F (36.8 C) (Oral)   Resp 18   SpO2 100%   Physical Exam  Constitutional: She appears well-developed and well-nourished. No distress.  Thin-appearing female.  HENT:  Head: Normocephalic and atraumatic.  Eyes: Conjunctivae are normal.  Cardiovascular: Regular rhythm and normal heart sounds.  Slightly bradycardic  Pulmonary/Chest: Effort normal and breath sounds normal.  Abdominal: Soft. Bowel sounds are normal. She exhibits no distension. There is no tenderness. There is no rebound and no guarding.  Neurological: She is alert.  Skin: Skin is warm.  Psychiatric: Her speech is normal and behavior is normal. She expresses impulsivity. She expresses no homicidal and no suicidal (denies SI in ED) ideation.  Pt calm and cooperative. Flat affect.  Nursing note and vitals reviewed.    ED Treatments / Results  Labs  Results for orders placed or performed during the hospital encounter of 98/33/82  Basic metabolic panel  Result Value Ref Range   Sodium 139 135 - 145 mmol/L   Potassium 3.1 (L) 3.5 - 5.1 mmol/L   Chloride 107 98 - 111 mmol/L   CO2 25 22 - 32 mmol/L   Glucose, Bld 70 70 - 99 mg/dL   BUN 8 8 - 23 mg/dL    Creatinine, Ser 0.49 0.44 - 1.00 mg/dL   Calcium 8.4 (L) 8.9 - 10.3 mg/dL   GFR calc non Af Amer >60 >60 mL/min   GFR calc Af Amer >60 >60 mL/min   Anion gap 7 5 - 15  CBC  Result Value Ref Range   WBC 3.2 (L) 4.0 - 10.5 K/uL   RBC 3.87 3.87 - 5.11 MIL/uL   Hemoglobin 10.0 (L) 12.0 - 15.0 g/dL   HCT 31.0 (L) 36.0 - 46.0 %   MCV 80.1 78.0 - 100.0 fL   MCH 25.8 (L) 26.0 - 34.0 pg   MCHC 32.3 30.0 - 36.0 g/dL   RDW 19.5 (H) 11.5 - 15.5 %   Platelets 96 (L) 150 - 400 K/uL  Acetaminophen level  Result Value Ref Range   Acetaminophen (Tylenol), Serum <10 (L) 10 - 30 ug/mL  Salicylate level  Result Value Ref Range   Salicylate Lvl <4.7 2.8 - 30.0 mg/dL   EKG None  Radiology No results found.  Procedures Procedures (including critical care time)  Medications Ordered in ED Medications  sertraline (ZOLOFT) 20 MG/ML concentrated solution 25 mg (has no administration in time range)  traZODone (DESYREL) tablet 50 mg (has no administration in time range)  hydrOXYzine (ATARAX/VISTARIL) tablet 25 mg (has no administration in time range)  potassium chloride SA (K-DUR,KLOR-CON) CR tablet 40 mEq (has no administration in time range)     Initial Impression / Assessment and Plan / ED Course  I have reviewed the triage vital signs and the nursing notes.  Pertinent labs & imaging results that were available during my care of the patient were reviewed by me and considered in my medical decision making (see chart for details).    Patient presented to the ED with caregiver for suicidal ideation and self injury by cutting her wrists this morning.  She is been having trouble with controlling diarrhea secondary to chemoradiation therapy for her metastatic cervical cancer.  She states she feels as though her body is starving her she is unable to keep any fluids or food and because it immediately causes diarrhea.  No medical complaints today.  Labs today with mild hypokalemia of 3.1, orally replaced.   TTS evaluated and recommending observation overnight with psych eval in the morning.  Final Clinical Impressions(s) / ED Diagnoses   Final diagnoses:  Self-injurious behavior    ED Discharge Orders    None       Robinson, [place] N, PA-C 06/03/18 1954    Duffy Bruce, MD 06/04/18 [phone]

## 2018-06-03 NOTE — ED Notes (Signed)
Bed: WA31 Expected date:  Expected time:  Means of arrival:  Comments: 

## 2018-06-04 ENCOUNTER — Ambulatory Visit: Payer: Medicaid Other

## 2018-06-04 DIAGNOSIS — F321 Major depressive disorder, single episode, moderate: Secondary | ICD-10-CM

## 2018-06-04 DIAGNOSIS — C539 Malignant neoplasm of cervix uteri, unspecified: Secondary | ICD-10-CM

## 2018-06-04 DIAGNOSIS — R45851 Suicidal ideations: Secondary | ICD-10-CM

## 2018-06-04 DIAGNOSIS — Z7289 Other problems related to lifestyle: Secondary | ICD-10-CM | POA: Insufficient documentation

## 2018-06-04 DIAGNOSIS — F489 Nonpsychotic mental disorder, unspecified: Secondary | ICD-10-CM

## 2018-06-04 MED ORDER — LOPERAMIDE HCL 2 MG PO CAPS
2.0000 mg | ORAL_CAPSULE | Freq: Four times a day (QID) | ORAL | Status: DC | PRN
  Administered 2018-06-04: 2 mg via ORAL
  Filled 2018-06-04: qty 1

## 2018-06-04 MED ORDER — POTASSIUM CHLORIDE IN NACL 20-0.9 MEQ/L-% IV SOLN
Freq: Every day | INTRAVENOUS | Status: DC
  Administered 2018-06-05: 11:00:00 via INTRAVENOUS
  Filled 2018-06-04 (×2): qty 1000

## 2018-06-04 MED ORDER — LOPERAMIDE HCL 2 MG PO CAPS
4.0000 mg | ORAL_CAPSULE | Freq: Once | ORAL | Status: AC
  Administered 2018-06-04: 4 mg via ORAL
  Filled 2018-06-04: qty 2

## 2018-06-04 MED ORDER — LOPERAMIDE HCL 2 MG PO CAPS
2.0000 mg | ORAL_CAPSULE | Freq: Once | ORAL | Status: AC
  Administered 2018-06-04: 2 mg via ORAL
  Filled 2018-06-04: qty 1

## 2018-06-04 MED ORDER — POTASSIUM CHLORIDE IN NACL 20-0.9 MEQ/L-% IV SOLN
Freq: Once | INTRAVENOUS | Status: AC
  Administered 2018-06-04: 13:00:00 via INTRAVENOUS
  Filled 2018-06-04: qty 1000

## 2018-06-04 MED ORDER — POTASSIUM CHLORIDE CRYS ER 10 MEQ PO TBCR
10.0000 meq | EXTENDED_RELEASE_TABLET | Freq: Every day | ORAL | Status: DC
  Administered 2018-06-04 – 2018-06-05 (×2): 10 meq via ORAL
  Filled 2018-06-04 (×2): qty 1

## 2018-06-04 NOTE — Consult Note (Addendum)
[facility] Psychiatry Consult   Reason for Consult:  Depression with suicidal ideation Referring Physician:  EDP Patient Identification: Erin Kent MRN:  [id number] Principal Diagnosis: Major depressive disorder, single episode, moderate ([place]) Diagnosis:   Patient Active Problem List   Diagnosis Date Noted  . Major depressive disorder, single episode, moderate (Erin Kent) [F32.1] 06/04/2018  . Self-injurious behavior [F48.9]   . Diarrhea [R19.7] 05/30/2018  . Acute hypokalemia [E87.6] 05/26/2018  . Other constipation [K59.09] 05/19/2018  . Iron deficiency anemia [D50.9] 05/05/2018  . Cachexia ([place]) [R64] 05/05/2018  . Vaginal tumor [D49.59]   . Malignant neoplasm of cervix ([place]) [C53.9] 04/23/2018  . OSTEOPOROSIS [M81.0] 10/10/2008  . HEMOCCULT POSITIVE STOOL [K92.1] 10/02/2008  . KYPHOSIS [M40.00] 10/02/2008  . DENTAL CARIES [K02.9] 05/24/2008  . TRICHOMONAL VULVOVAGINITIS [A59.01] 04/15/2007  . OVARIAN CYST, LEFT [N83.209] 10/14/2004    Total Time spent with patient: 45 minutes  Subjective:   Erin Kent is a 67 y.o. female patient admitted with suicidal ideation.  HPI:  Pt was seen and chart reviewed with treatment team and Dr Darleene Cleaver. Pt was not able to contract for safety today. She was diagnosed with cervical cancer and has been ruminating on what is happening inside her body. She also is having some bowel issues with diarrhea and is requiring IV fluids on a regular basis. Pt was despondent and placed a few superficial cuts to her forearm. Pt stated she just is having difficulty with her diagnosis. She is depressed and has never had any treatment for depression. She was started on an antidepressant and will be monitored in the emergency room for medication side effects and safety. Will consider discharge on 06-05-2018.   Past Psychiatric History: As above  Risk to Self: Suicidal Ideation: Yes-Currently Present Suicidal Intent: Yes-Currently Present Is patient at risk for  suicide?: Yes Suicidal Plan?: No Access to Means: No What has been your use of drugs/alcohol within the last 12 months?: Denies How many times?: 0 Other Self Harm Risks: (NA) Triggers for Past Attempts: Unknown Intentional Self Injurious Behavior: None Risk to Others: Homicidal Ideation: No Thoughts of Harm to Others: No Current Homicidal Intent: No Current Homicidal Plan: No Access to Homicidal Means: No Identified Victim: NA History of harm to others?: No Assessment of Violence: None Noted Violent Behavior Description: NA Does patient have access to weapons?: No Criminal Charges Pending?: No Does patient have a court date: No Prior Inpatient Therapy: Prior Inpatient Therapy: No Prior Outpatient Therapy: Prior Outpatient Therapy: No Does patient have an ACCT team?: No Does patient have Intensive In-House Services?  : No Does patient have [facility] services? : No Does patient have P4CC services?: No  Past Medical History:  Past Medical History:  Diagnosis Date  . Cervical cancer (Erin Kent)   . Iron deficiency anemia     Past Surgical History:  Procedure Laterality Date  . CYSTOSCOPY N/A 04/28/2018   Procedure: CYSTOSCOPY;  Surgeon: Isabel Caprice, MD;  Location: [facility];  Service: Gynecology;  Laterality: N/A;  . EUA/ PAPSMEAR/  CERVICAL MASS BIOPSY  04-12-2018   dr peggy constant '@[facility]   . IR IMAGING GUIDED PORT INSERTION  05/12/2018  . WISDOM TOOTH EXTRACTION     Family History:  Family History  Problem Relation Age of Onset  . Hypertension Mother    Family Psychiatric  History: Unknown Social History:  Social History   Substance and Sexual Activity  Alcohol Use No     Social History   Substance  and Sexual Activity  Drug Use No    Social History   Socioeconomic History  . Marital status: Single    Spouse name: Not on file  . Number of children: 0  . Years of education: Not on file  . Highest education level: Not on file  Occupational  History  . Not on file  Social Needs  . Financial resource strain: Not on file  . Food insecurity:    Worry: Not on file    Inability: Not on file  . Transportation needs:    Medical: Not on file    Non-medical: Not on file  Tobacco Use  . Smoking status: Never Smoker  . Smokeless tobacco: Never Used  Substance and Sexual Activity  . Alcohol use: No  . Drug use: No  . Sexual activity: Not Currently    Birth control/protection: None  Lifestyle  . Physical activity:    Days per week: Not on file    Minutes per session: Not on file  . Stress: Not on file  Relationships  . Social connections:    Talks on phone: Not on file    Gets together: Not on file    Attends religious service: Not on file    Active member of club or organization: Not on file    Attends meetings of clubs or organizations: Not on file    Relationship status: Not on file  Other Topics Concern  . Not on file  Social History Narrative  . Not on file   Additional Social History:    Allergies:  No Known Allergies  Labs:  Results for orders placed or performed during the hospital encounter of 06/03/18 (from the past 48 hour(s))  Basic metabolic panel     Status: Abnormal   Collection Time: 06/03/18  3:09 PM  Result Value Ref Range   Sodium 139 135 - 145 mmol/L   Potassium 3.1 (L) 3.5 - 5.1 mmol/L   Chloride 107 98 - 111 mmol/L   CO2 25 22 - 32 mmol/L   Glucose, Bld 70 70 - 99 mg/dL   BUN 8 8 - 23 mg/dL   Creatinine, Ser 0.49 0.44 - 1.00 mg/dL   Calcium 8.4 (L) 8.9 - 10.3 mg/dL   GFR calc non Af Amer >60 >60 mL/min   GFR calc Af Amer >60 >60 mL/min    Comment: (NOTE) The eGFR has been calculated using the CKD EPI equation. This calculation has not been validated in all clinical situations. eGFR's persistently <60 mL/min signify possible Chronic Kidney Disease.    Anion gap 7 5 - 15    Comment: Performed at [facility], [place] [address] [place], [place] [address]  CBC      Status: Abnormal   Collection Time: 06/03/18  3:09 PM  Result Value Ref Range   WBC 3.2 (L) 4.0 - 10.5 K/uL   RBC 3.87 3.87 - 5.11 MIL/uL   Hemoglobin 10.0 (L) 12.0 - 15.0 g/dL   HCT 31.0 (L) 36.0 - 46.0 %   MCV 80.1 78.0 - 100.0 fL   MCH 25.8 (L) 26.0 - 34.0 pg   MCHC 32.3 30.0 - 36.0 g/dL   RDW 19.5 (H) 11.5 - 15.5 %   Platelets 96 (L) 150 - 400 K/uL    Comment: REPEATED TO VERIFY SPECIMEN CHECKED FOR CLOTS PLATELET COUNT CONFIRMED BY SMEAR Performed at [place] [address], [place], [place] [address]   Acetaminophen level  Status: Abnormal   Collection Time: 06/03/18  3:13 PM  Result Value Ref Range   Acetaminophen (Tylenol), Serum <10 (L) 10 - 30 ug/mL    Comment: (NOTE) Therapeutic concentrations vary significantly. A range of 10-30 ug/mL  may be an effective concentration for many patients. However, some  are best treated at concentrations outside of this range. Acetaminophen concentrations >150 ug/mL at 4 hours after ingestion  and >50 ug/mL at 12 hours after ingestion are often associated with  toxic reactions. Performed at [facility], [place] [address], [place], [place] [address]   Salicylate level     Status: None   Collection Time: 06/03/18  3:13 PM  Result Value Ref Range   Salicylate Lvl <9.7 2.8 - 30.0 mg/dL    Comment: Performed at [facility], [place] [address], [place], [place] [address]    Current Facility-Administered Medications  Medication Dose Route Frequency Provider Last Rate Last Dose  . [START ON 06/05/2018] 0.9 % NaCl with KCl 20 mEq/ L  infusion   Intravenous Daily Milton Ferguson, MD      . hydrOXYzine (ATARAX/VISTARIL) tablet 25 mg  25 mg Oral TID PRN Ethelene Hal, NP      . sertraline (ZOLOFT) 20 MG/ML concentrated solution 25 mg  25 mg Oral Daily Ethelene Hal, NP   25 mg at 06/04/18 0933  . traZODone (DESYREL) tablet 50 mg  50 mg Oral QHS Ethelene Hal,  NP   50 mg at 06/03/18 2058   Current Outpatient Medications  Medication Sig Dispense Refill  . loperamide (IMODIUM A-D) 2 MG tablet Take 2 mg by mouth as needed for diarrhea or loose stools.    Marland Kitchen dexamethasone (DECADRON) 4 MG tablet Take 1 tablet (4 mg total) by mouth daily. 30 tablet 0  . ferrous sulfate (FERROUSUL) 325 (65 FE) MG tablet Take 1 tablet (325 mg total) by mouth 2 (two) times daily. (Patient not taking: Reported on 05/29/2018) 60 tablet 1  . ibuprofen (ADVIL,MOTRIN) 600 MG tablet Take 1 tablet (600 mg total) by mouth every 6 (six) hours as needed. (Patient not taking: Reported on 05/29/2018) 60 tablet 3  . lidocaine-prilocaine (EMLA) cream Apply to affected area once (Patient taking differently: Apply 1 application topically as needed (for port acces). ) 30 g 3  . ondansetron (ZOFRAN) 8 MG tablet Take 1 tablet (8 mg total) by mouth every 8 (eight) hours as needed. Start on the third day after chemotherapy. 30 tablet 1  . oxyCODONE-acetaminophen (PERCOCET/ROXICET) 5-325 MG tablet Take 1-2 tablets by mouth every 6 (six) hours as needed. (Patient not taking: Reported on 05/04/2018) 20 tablet 0  . prochlorperazine (COMPAZINE) 10 MG tablet Take 1 tablet (10 mg total) by mouth every 6 (six) hours as needed (Nausea or vomiting). (Patient not taking: Reported on 06/03/2018) 30 tablet 1    Musculoskeletal: Strength & Muscle Tone: within normal limits Gait & Station: normal Patient leans: N/A  Psychiatric Specialty Exam: Physical Exam  Constitutional: She is oriented to person, place, and time.  HENT:  Head: Normocephalic.  Respiratory: Effort normal.  Neurological: She is alert and oriented to person, place, and time.  Psychiatric: Her speech is normal and behavior is normal. Thought content normal. Cognition and memory are normal. She expresses impulsivity. She exhibits a depressed mood.    Review of Systems  Psychiatric/Behavioral: Positive for depression and suicidal ideas. Negative  for hallucinations, memory loss and substance abuse. The patient is not nervous/anxious and  does not have insomnia.   All other systems reviewed and are negative.   Blood pressure 111/62, pulse 61, temperature 98.5 F (36.9 C), temperature source Oral, resp. rate 12, SpO2 100 %.There is no height or weight on file to calculate BMI.  General Appearance: Casual  Eye Contact:  Good  Speech:  Clear and Coherent and Normal Rate  Volume:  Normal  Mood:  Anxious and Depressed  Affect:  Congruent and Depressed  Thought Process:  Coherent, Goal Directed and Linear  Orientation:  Full (Time, Place, and Person)  Thought Content:  Logical  Suicidal Thoughts:  Yes.  with intent/plan  Homicidal Thoughts:  No  Memory:  Immediate;   Good Recent;   Fair Remote;   Fair  Judgement:  Fair  Insight:  Fair  Psychomotor Activity:  Decreased  Concentration:  Concentration: Good and Attention Span: Good  Recall:  Good  Fund of Knowledge:  Good  Language:  Good  Akathisia:  No  Handed:  Right  AIMS (if indicated):     Assets:  Agricultural consultant Housing Social Support  ADL's:  Intact  Cognition:  WNL  Sleep:        Treatment Plan Summary: Daily contact with patient to assess and evaluate symptoms and progress in treatment, Medication management and Plan Pt will be observed overnight for medication efficacy and safety.   Disposition: Pt was started on antidepressant medication. She will be monitored overnight for medication efficacy and safety. Will l ook at discharge on 06-05-2018  Ethelene Hal, NP 06/04/2018 1:24 PM  Patient seen face-to-face for psychiatric evaluation, chart reviewed and case discussed with the physician extender and developed treatment plan. Reviewed the information documented and agree with the treatment plan. Corena Pilgrim, MD

## 2018-06-05 LAB — RAPID URINE DRUG SCREEN, HOSP PERFORMED
Amphetamines: NOT DETECTED
Barbiturates: NOT DETECTED
Benzodiazepines: NOT DETECTED
Cocaine: NOT DETECTED
OPIATES: NOT DETECTED
Tetrahydrocannabinol: NOT DETECTED

## 2018-06-05 LAB — URINALYSIS, ROUTINE W REFLEX MICROSCOPIC
BILIRUBIN URINE: NEGATIVE
GLUCOSE, UA: NEGATIVE mg/dL
KETONES UR: 20 mg/dL — AB
NITRITE: NEGATIVE
PROTEIN: NEGATIVE mg/dL
Specific Gravity, Urine: 1.009 (ref 1.005–1.030)
pH: 6 (ref 5.0–8.0)

## 2018-06-05 MED ORDER — HYDROXYZINE HCL 25 MG PO TABS: 25.0000 mg | ORAL_TABLET | Freq: Three times a day (TID) | ORAL | 0 refills | Status: DC | PRN

## 2018-06-05 MED ORDER — SERTRALINE HCL 25 MG PO TABS: 25.0000 mg | ORAL_TABLET | Freq: Every day | ORAL | 0 refills | Status: DC

## 2018-06-05 MED ORDER — TRAZODONE HCL 50 MG PO TABS: 50.0000 mg | ORAL_TABLET | Freq: Every day | ORAL | 0 refills | Status: DC

## 2018-06-05 MED ORDER — HEPARIN SOD (PORK) LOCK FLUSH 100 UNIT/ML IV SOLN
500.0000 [IU] | Freq: Once | INTRAVENOUS | Status: DC
  Filled 2018-06-05 (×2): qty 5

## 2018-06-05 MED ORDER — SERTRALINE HCL 50 MG PO TABS: 25.0000 mg | ORAL_TABLET | Freq: Every day | ORAL | Status: DC

## 2018-06-05 NOTE — ED Notes (Addendum)
Patient denies pain and is resting comfortably. Sitting up in bed eating breakfast.

## 2018-06-05 NOTE — ED Notes (Signed)
Pt discharged to home. DC instructions given with caregiver Fannie at bedside. Prescriptions x 3 meds also given. No concerns voiced. Pt left unit ambulatory and stable accompanied by personal caregiver.

## 2018-06-05 NOTE — Consult Note (Addendum)
[facility] Consult   Reason for Consult:  Depression with suicidal ideation Referring Physician:  EDP Patient Identification: Erin Kent MRN:  [id number] Principal Diagnosis: Major depressive disorder, single episode, moderate ([place]) Diagnosis:   Patient Active Problem List   Diagnosis Date Noted  . Major depressive disorder, single episode, moderate ([place]) [F32.1] 06/04/2018  . Self-injurious behavior [F48.9]   . Diarrhea [R19.7] 05/30/2018  . Acute hypokalemia [E87.6] 05/26/2018  . Other constipation [K59.09] 05/19/2018  . Iron deficiency anemia [D50.9] 05/05/2018  . Cachexia ([place]) [R64] 05/05/2018  . Vaginal tumor [D49.59]   . Malignant neoplasm of cervix ([place]) [C53.9] 04/23/2018  . OSTEOPOROSIS [M81.0] 10/10/2008  . HEMOCCULT POSITIVE STOOL [K92.1] 10/02/2008  . KYPHOSIS [M40.00] 10/02/2008  . DENTAL CARIES [K02.9] 05/24/2008  . TRICHOMONAL VULVOVAGINITIS [A59.01] 04/15/2007  . OVARIAN CYST, LEFT [N83.209] 10/14/2004    Total Time spent with patient: 45 minutes  Subjective:   Erin Kent is a 67 y.o. female patient admitted with suicidal ideation.  HPI:  Pt was seen and chart reviewed with treatment team and Dr Darleene Cleaver. Pt was not able to contract for safety today. She was diagnosed with cervical cancer and has been ruminating on what is happening inside her body. She also is having some bowel issues with diarrhea and is requiring IV fluids on a regular basis. Pt was despondent and placed a few superficial cuts to her forearm. Pt stated she just is having difficulty with her diagnosis. She is depressed and has never had any treatment for depression. She was started on an antidepressant and will be monitored in the emergency room for medication side effects and safety.  Per nursing staff:Pt has remained calm and cooperative in the [facility]. Pt slept well last night and ate a small breakfast. Pt was in the bathroom during rounds this morning and is receiving IV fluids  per cancer center orders. Pt will follow up with her team at the cancer center and will be given outpatient resources for therapy and medication management. Pt will be provided a prescription for the Zoloft, anti depressant, she was started on in the emergency room. Pt is stable and psychiatrically clear for discharge.     Past Psychiatric History: As above  Risk to Self: Suicidal Ideation: Yes-Currently Present Suicidal Intent: Yes-Currently Present Is patient at risk for suicide?: Yes Suicidal Plan?: No Access to Means: No What has been your use of drugs/alcohol within the last 12 months?: Denies How many times?: 0 Other Self Harm Risks: (NA) Triggers for Past Attempts: Unknown Intentional Self Injurious Behavior: None Risk to Others: Homicidal Ideation: No Thoughts of Harm to Others: No Current Homicidal Intent: No Current Homicidal Plan: No Access to Homicidal Means: No Identified Victim: NA History of harm to others?: No Assessment of Violence: None Noted Violent Behavior Description: NA Does patient have access to weapons?: No Criminal Charges Pending?: No Does patient have a court date: No Prior Inpatient Therapy: Prior Inpatient Therapy: No Prior Outpatient Therapy: Prior Outpatient Therapy: No Does patient have an ACCT team?: No Does patient have Intensive In-House Services?  : No Does patient have [facility] services? : No Does patient have P4CC services?: No  Past Medical History:  Past Medical History:  Diagnosis Date  . Cervical cancer ([place])   . Iron deficiency anemia     Past Surgical History:  Procedure Laterality Date  . CYSTOSCOPY N/A 04/28/2018   Procedure: CYSTOSCOPY;  Surgeon: Isabel Caprice, MD;  Location: [facility];  Service: Gynecology;  Laterality: N/A;  . EUA/ PAPSMEAR/  CERVICAL MASS BIOPSY  04-12-2018   dr peggy constant '@[facility]'$   . IR IMAGING GUIDED PORT INSERTION  05/12/2018  . WISDOM TOOTH EXTRACTION     Family History:  Family  History  Problem Relation Age of Onset  . Hypertension Mother    Family Psychiatric  History: Unknown Social History:  Social History   Substance and Sexual Activity  Alcohol Use No     Social History   Substance and Sexual Activity  Drug Use No    Social History   Socioeconomic History  . Marital status: Single    Spouse name: Not on file  . Number of children: 0  . Years of education: Not on file  . Highest education level: Not on file  Occupational History  . Not on file  Social Needs  . Financial resource strain: Not on file  . Food insecurity:    Worry: Not on file    Inability: Not on file  . Transportation needs:    Medical: Not on file    Non-medical: Not on file  Tobacco Use  . Smoking status: Never Smoker  . Smokeless tobacco: Never Used  Substance and Sexual Activity  . Alcohol use: No  . Drug use: No  . Sexual activity: Not Currently    Birth control/protection: None  Lifestyle  . Physical activity:    Days per week: Not on file    Minutes per session: Not on file  . Stress: Not on file  Relationships  . Social connections:    Talks on phone: Not on file    Gets together: Not on file    Attends religious service: Not on file    Active member of club or organization: Not on file    Attends meetings of clubs or organizations: Not on file    Relationship status: Not on file  Other Topics Concern  . Not on file  Social History Narrative  . Not on file   Additional Social History:    Allergies:  No Known Allergies  Labs:  Results for orders placed or performed during the hospital encounter of 06/03/18 (from the past 48 hour(s))  Basic metabolic panel     Status: Abnormal   Collection Time: 06/03/18  3:09 PM  Result Value Ref Range   Sodium 139 135 - 145 mmol/L   Potassium 3.1 (L) 3.5 - 5.1 mmol/L   Chloride 107 98 - 111 mmol/L   CO2 25 22 - 32 mmol/L   Glucose, Bld 70 70 - 99 mg/dL   BUN 8 8 - 23 mg/dL   Creatinine, Ser 0.49 0.44 -  1.00 mg/dL   Calcium 8.4 (L) 8.9 - 10.3 mg/dL   GFR calc non Af Amer >60 >60 mL/min   GFR calc Af Amer >60 >60 mL/min    Comment: (NOTE) The eGFR has been calculated using the CKD EPI equation. This calculation has not been validated in all clinical situations. eGFR's persistently <60 mL/min signify possible Chronic Kidney Disease.    Anion gap 7 5 - 15    Comment: Performed at [facility], [place] [address], [place], [place] [address]  CBC     Status: Abnormal   Collection Time: 06/03/18  3:09 PM  Result Value Ref Range   WBC 3.2 (L) 4.0 - 10.5 K/uL   RBC 3.87 3.87 - 5.11 MIL/uL   Hemoglobin 10.0 (L) 12.0 - 15.0 g/dL  HCT 31.0 (L) 36.0 - 46.0 %   MCV 80.1 78.0 - 100.0 fL   MCH 25.8 (L) 26.0 - 34.0 pg   MCHC 32.3 30.0 - 36.0 g/dL   RDW 19.5 (H) 11.5 - 15.5 %   Platelets 96 (L) 150 - 400 K/uL    Comment: REPEATED TO VERIFY SPECIMEN CHECKED FOR CLOTS PLATELET COUNT CONFIRMED BY SMEAR Performed at [facility], [place] [address], [place], [place] [address]   Acetaminophen level     Status: Abnormal   Collection Time: 06/03/18  3:13 PM  Result Value Ref Range   Acetaminophen (Tylenol), Serum <10 (L) 10 - 30 ug/mL    Comment: (NOTE) Therapeutic concentrations vary significantly. A range of 10-30 ug/mL  may be an effective concentration for many patients. However, some  are best treated at concentrations outside of this range. Acetaminophen concentrations >150 ug/mL at 4 hours after ingestion  and >50 ug/mL at 12 hours after ingestion are often associated with  toxic reactions. Performed at [facility], [place] [address], [place], [place] [address]   Salicylate level     Status: None   Collection Time: 06/03/18  3:13 PM  Result Value Ref Range   Salicylate Lvl <8.9 2.8 - 30.0 mg/dL    Comment: Performed at [facility], [place] [address] [place], [place] [address]  Rapid urine drug screen (hospital  performed)     Status: None   Collection Time: 06/05/18  7:45 AM  Result Value Ref Range   Opiates NONE DETECTED NONE DETECTED   Cocaine NONE DETECTED NONE DETECTED   Benzodiazepines NONE DETECTED NONE DETECTED   Amphetamines NONE DETECTED NONE DETECTED   Tetrahydrocannabinol NONE DETECTED NONE DETECTED   Barbiturates NONE DETECTED NONE DETECTED    Comment: (NOTE) DRUG SCREEN FOR MEDICAL PURPOSES ONLY.  IF CONFIRMATION IS NEEDED FOR ANY PURPOSE, NOTIFY LAB WITHIN 5 DAYS. LOWEST DETECTABLE LIMITS FOR URINE DRUG SCREEN Drug Class                     Cutoff (ng/mL) Amphetamine and metabolites    1000 Barbiturate and metabolites    200 Benzodiazepine                 174 Tricyclics and metabolites     300 Opiates and metabolites        300 Cocaine and metabolites        300 THC                            50 Performed at [facility], [place] [address] [place], [place] [address]   Urinalysis, Routine w reflex microscopic     Status: Abnormal   Collection Time: 06/05/18  7:45 AM  Result Value Ref Range   Color, Urine YELLOW (A) YELLOW   APPearance CLEAR CLEAR   Specific Gravity, Urine 1.009 1.005 - 1.030   pH 6.0 5.0 - 8.0   Glucose, UA NEGATIVE NEGATIVE mg/dL   Hgb urine dipstick MODERATE (A) NEGATIVE   Bilirubin Urine NEGATIVE NEGATIVE   Ketones, ur 20 (A) NEGATIVE mg/dL   Protein, ur NEGATIVE NEGATIVE mg/dL   Nitrite NEGATIVE NEGATIVE   Leukocytes, UA LARGE (A) NEGATIVE   RBC / HPF 11-20 0 - 5 RBC/hpf   WBC, UA 21-50 0 - 5 WBC/hpf   Bacteria, UA FEW (A) NONE SEEN   Squamous Epithelial / LPF 0-5 0 -  5    Comment: Performed at [facility], [place] [address], [place], [place] [address]    Current Facility-Administered Medications  Medication Dose Route Frequency Provider Last Rate Last Dose  . 0.9 % NaCl with KCl 20 mEq/ L  infusion   Intravenous Daily Milton Ferguson, MD 125 mL/hr at 06/05/18 1110    . hydrOXYzine (ATARAX/VISTARIL) tablet  25 mg  25 mg Oral TID PRN Ethelene Hal, NP      . loperamide (IMODIUM) capsule 2 mg  2 mg Oral QID PRN Fredia Sorrow, MD   2 mg at 06/04/18 1705  . potassium chloride (K-DUR,KLOR-CON) CR tablet 10 mEq  10 mEq Oral Daily Fredia Sorrow, MD   10 mEq at 06/05/18 1106  . sertraline (ZOLOFT) 20 MG/ML concentrated solution 25 mg  25 mg Oral Daily Ethelene Hal, NP   25 mg at 06/05/18 1107  . traZODone (DESYREL) tablet 50 mg  50 mg Oral QHS Ethelene Hal, NP   50 mg at 06/04/18 2142   Current Outpatient Medications  Medication Sig Dispense Refill  . loperamide (IMODIUM A-D) 2 MG tablet Take 2 mg by mouth as needed for diarrhea or loose stools.    Marland Kitchen dexamethasone (DECADRON) 4 MG tablet Take 1 tablet (4 mg total) by mouth daily. 30 tablet 0  . ferrous sulfate (FERROUSUL) 325 (65 FE) MG tablet Take 1 tablet (325 mg total) by mouth 2 (two) times daily. (Patient not taking: Reported on 05/29/2018) 60 tablet 1  . ibuprofen (ADVIL,MOTRIN) 600 MG tablet Take 1 tablet (600 mg total) by mouth every 6 (six) hours as needed. (Patient not taking: Reported on 05/29/2018) 60 tablet 3  . lidocaine-prilocaine (EMLA) cream Apply to affected area once (Patient taking differently: Apply 1 application topically as needed (for port acces). ) 30 g 3  . ondansetron (ZOFRAN) 8 MG tablet Take 1 tablet (8 mg total) by mouth every 8 (eight) hours as needed. Start on the third day after chemotherapy. 30 tablet 1  . oxyCODONE-acetaminophen (PERCOCET/ROXICET) 5-325 MG tablet Take 1-2 tablets by mouth every 6 (six) hours as needed. (Patient not taking: Reported on 05/04/2018) 20 tablet 0  . prochlorperazine (COMPAZINE) 10 MG tablet Take 1 tablet (10 mg total) by mouth every 6 (six) hours as needed (Nausea or vomiting). (Patient not taking: Reported on 06/03/2018) 30 tablet 1    Musculoskeletal: Strength & Muscle Tone: within normal limits Gait & Station: normal Patient leans: N/A  Psychiatric Specialty  Exam: Physical Exam  Constitutional: She is oriented to person, place, and time. She appears well-developed.  HENT:  Head: Normocephalic.  Respiratory: Effort normal.  Neurological: She is alert and oriented to person, place, and time.  Psychiatric: Her speech is normal and behavior is normal. Thought content normal. Cognition and memory are normal. She expresses impulsivity. She exhibits a depressed mood.    Review of Systems  Psychiatric/Behavioral: Positive for depression and suicidal ideas. Negative for hallucinations, memory loss and substance abuse. The patient is not nervous/anxious and does not have insomnia.   All other systems reviewed and are negative.   Blood pressure 135/75, pulse 63, temperature 98.1 F (36.7 C), temperature source Oral, resp. rate 18, SpO2 99 %.There is no height or weight on file to calculate BMI.  General Appearance: Casual  Eye Contact:  Good  Speech:  Clear and Coherent and Normal Rate  Volume:  Normal  Mood:  Anxious and Depressed  Affect:  Congruent and Depressed  Thought  Process:  Coherent, Goal Directed and Linear  Orientation:  Full (Time, Place, and Person)  Thought Content:  Logical  Suicidal Thoughts:  Yes.  with intent/plan  Homicidal Thoughts:  No  Memory:  Immediate;   Good Recent;   Fair Remote;   Fair  Judgement:  Fair  Insight:  Fair  Psychomotor Activity:  Decreased  Concentration:  Concentration: Good and Attention Span: Good  Recall:  Good  Fund of Knowledge:  Good  Language:  Good  Akathisia:  No  Handed:  Right  AIMS (if indicated):     Assets:  Agricultural consultant Housing Social Support  ADL's:  Intact  Cognition:  WNL  Sleep:   Fair     Treatment Plan Summary: Plan Discharge Home  Follow up with your providers and social work team at the cancer center Follow up with the outpatient resources provided to you in your discharge paperwork Take all medications as prescribed. You  were started on an anti-depressant in the emergency room, Zoloft 25 mg daily. Your were provided with a prescription for this. Please follow up with your outpatient provider for medication management.    Disposition: No evidence of imminent risk to self or others at present.   Patient does not meet criteria for psychiatric inpatient admission. Supportive therapy provided about ongoing stressors. Discussed crisis plan, support from social network, calling 911, coming to the Emergency Department, and calling Suicide Hotline.  Ethelene Hal, NP 06/05/2018 11:47 AM  Patient seen face-to-face for psychiatric evaluation, chart reviewed and case discussed with the physician extender and developed treatment plan. Reviewed the information documented and agree with the treatment plan. Corena Pilgrim, MD

## 2018-06-05 NOTE — Discharge Instructions (Signed)
For your mental health needs, please follow up with:  [facility]. [address] [place], [place] [address] [phone]  Or  [address] [place], [place] [address] [phone]  You may also find it helpful to speak with, Hospice to explore any benefits they may have for you. They have support groups and social workers you can speak with.   [facility] [address] [place], [place] [address] [phone]

## 2018-06-06 ENCOUNTER — Inpatient Hospital Stay: Payer: Medicaid Other

## 2018-06-06 ENCOUNTER — Ambulatory Visit
Admission: RE | Admit: 2018-06-06 | Discharge: 2018-06-06 | Disposition: A | Discharge status: Home / Self Care | Payer: Medicaid Other | Source: Ambulatory Visit | Attending: Radiation Oncology | Admitting: Radiation Oncology

## 2018-06-06 ENCOUNTER — Encounter: Payer: Self-pay | Admitting: Oncology

## 2018-06-06 VITALS — BP 107/66 | HR 69 | Temp 97.6°F | Resp 18

## 2018-06-06 DIAGNOSIS — C539 Malignant neoplasm of cervix uteri, unspecified: Secondary | ICD-10-CM

## 2018-06-06 DIAGNOSIS — Z5111 Encounter for antineoplastic chemotherapy: Secondary | ICD-10-CM | POA: Diagnosis not present

## 2018-06-06 DIAGNOSIS — Z510 Encounter for antineoplastic radiation therapy: Secondary | ICD-10-CM | POA: Diagnosis not present

## 2018-06-06 MED ORDER — SODIUM CHLORIDE 0.9% FLUSH
10.0000 mL | Freq: Once | INTRAVENOUS | Status: AC | PRN
  Administered 2018-06-06: 10 mL
  Filled 2018-06-06: qty 10

## 2018-06-06 MED ORDER — SODIUM CHLORIDE 0.9 % IV SOLN
Freq: Once | INTRAVENOUS | Status: AC
  Administered 2018-06-06: 11:00:00 via INTRAVENOUS
  Filled 2018-06-06: qty 250

## 2018-06-06 MED ORDER — HEPARIN SOD (PORK) LOCK FLUSH 100 UNIT/ML IV SOLN
500.0000 [IU] | Freq: Once | INTRAVENOUS | Status: AC | PRN
  Administered 2018-06-06: 500 [IU]
  Filled 2018-06-06: qty 5

## 2018-06-06 MED ORDER — SODIUM CHLORIDE 0.9 % IV SOLN
Freq: Once | INTRAVENOUS | Status: AC
  Administered 2018-06-06: 12:00:00 via INTRAVENOUS
  Filled 2018-06-06: qty 1000

## 2018-06-06 NOTE — Patient Instructions (Signed)
Dehydration, Adult Dehydration is a condition in which there is not enough fluid or water in the body. This happens when you lose more fluids than you take in. Important organs, such as the kidneys, brain, and heart, cannot function without a proper amount of fluids. Any loss of fluids from the body can lead to dehydration. Dehydration can range from mild to severe. This condition should be treated right away to prevent it from becoming severe. What are the causes? This condition may be caused by:  Vomiting.  Diarrhea.  Excessive sweating, such as from heat exposure or exercise.  Not drinking enough fluid, especially: ? When ill. ? While doing activity that requires a lot of energy.  Excessive urination.  Fever.  Infection.  Certain medicines, such as medicines that cause the body to lose excess fluid (diuretics).  Inability to access safe drinking water.  Reduced physical ability to get adequate water and food.  What increases the risk? This condition is more likely to develop in people:  Who have a poorly controlled long-term (chronic) illness, such as diabetes, heart disease, or kidney disease.  Who are age 65 or older.  Who are disabled.  Who live in a place with high altitude.  Who play endurance sports.  What are the signs or symptoms? Symptoms of mild dehydration may include:  Thirst.  Dry lips.  Slightly dry mouth.  Dry, warm skin.  Dizziness. Symptoms of moderate dehydration may include:  Very dry mouth.  Muscle cramps.  Dark urine. Urine may be the color of tea.  Decreased urine production.  Decreased tear production.  Heartbeat that is irregular or faster than normal (palpitations).  Headache.  Light-headedness, especially when you stand up from a sitting position.  Fainting (syncope). Symptoms of severe dehydration may include:  Changes in skin, such as: ? Cold and clammy skin. ? Blotchy (mottled) or pale skin. ? Skin that does  not quickly return to normal after being lightly pinched and released (poor skin turgor).  Changes in body fluids, such as: ? Extreme thirst. ? No tear production. ? Inability to sweat when body temperature is high, such as in hot weather. ? Very little urine production.  Changes in vital signs, such as: ? Weak pulse. ? Pulse that is more than 100 beats a minute when sitting still. ? Rapid breathing. ? Low blood pressure.  Other changes, such as: ? Sunken eyes. ? Cold hands and feet. ? Confusion. ? Lack of energy (lethargy). ? Difficulty waking up from sleep. ? Short-term weight loss. ? Unconsciousness. How is this diagnosed? This condition is diagnosed based on your symptoms and a physical exam. Blood and urine tests may be done to help confirm the diagnosis. How is this treated? Treatment for this condition depends on the severity. Mild or moderate dehydration can often be treated at home. Treatment should be started right away. Do not wait until dehydration becomes severe. Severe dehydration is an emergency and it needs to be treated in a hospital. Treatment for mild dehydration may include:  Drinking more fluids.  Replacing salts and minerals in your blood (electrolytes) that you may have lost. Treatment for moderate dehydration may include:  Drinking an oral rehydration solution (ORS). This is a drink that helps you replace fluids and electrolytes (rehydrate). It can be found at pharmacies and retail stores. Treatment for severe dehydration may include:  Receiving fluids through an IV tube.  Receiving an electrolyte solution through a feeding tube that is passed through your nose   and into your stomach (nasogastric tube, or NG tube).  Correcting any abnormalities in electrolytes.  Treating the underlying cause of dehydration. Follow these instructions at home:  If directed by your health care provider, drink an ORS: ? Make an ORS by following instructions on the  package. ? Start by drinking small amounts, about  cup (120 mL) every 5-10 minutes. ? Slowly increase how much you drink until you have taken the amount recommended by your health care provider.  Drink enough clear fluid to keep your urine clear or pale yellow. If you were told to drink an ORS, finish the ORS first, then start slowly drinking other clear fluids. Drink fluids such as: ? Water. Do not drink only water. Doing that can lead to having too little salt (sodium) in the body (hyponatremia). ? Ice chips. ? Fruit juice that you have added water to (diluted fruit juice). ? Low-calorie sports drinks.  Avoid: ? Alcohol. ? Drinks that contain a lot of sugar. These include high-calorie sports drinks, fruit juice that is not diluted, and soda. ? Caffeine. ? Foods that are greasy or contain a lot of fat or sugar.  Take over-the-counter and prescription medicines only as told by your health care provider.  Do not take sodium tablets. This can lead to having too much sodium in the body (hypernatremia).  Eat foods that contain a healthy balance of electrolytes, such as bananas, oranges, potatoes, tomatoes, and spinach.  Keep all follow-up visits as told by your health care provider. This is important. Contact a health care provider if:  You have abdominal pain that: ? Gets worse. ? Stays in one area (localizes).  You have a rash.  You have a stiff neck.  You are more irritable than usual.  You are sleepier or more difficult to wake up than usual.  You feel weak or dizzy.  You feel very thirsty.  You have urinated only a small amount of very dark urine over 6-8 hours. Get help right away if:  You have symptoms of severe dehydration.  You cannot drink fluids without vomiting.  Your symptoms get worse with treatment.  You have a fever.  You have a severe headache.  You have vomiting or diarrhea that: ? Gets worse. ? Does not go away.  You have blood or green matter  (bile) in your vomit.  You have blood in your stool. This may cause stool to look black and tarry.  You have not urinated in 6-8 hours.  You faint.  Your heart rate while sitting still is over 100 beats a minute.  You have trouble breathing. This information is not intended to replace advice given to you by your health care provider. Make sure you discuss any questions you have with your health care provider. Document Released: 08/31/2005 Document Revised: 03/27/2016 Document Reviewed: 10/25/2015 Elsevier Interactive Patient Education  2018 [employer]  

## 2018-06-06 NOTE — Progress Notes (Signed)
Met with patient's caregiver, Erin Kent, and discussed what happened with patient on Friday.  Per Erin Kent is having trouble handling the diarrhea and also was upset because she received something in her IV on Thursday that was yellow in color.  The patient thought it was urine and was scared.  Erin Kent is requesting that all treatments are explained thoroughly to [name].  She also said the skin on Erin Kent's vaginal/rectal area is very red and irritated.  Advised her that I will notify Dr. Sondra Come and check to see if she can be seen today after radiation.  Erin Kent is also requesting a nutrition consult.  She said they do not have a follow up appointment with psychology but do have a number they can call to set one up.  Also left a message with [name], CSW.  Also meet with [name] in the infusion room.  Discussed why she is having diarrhea and how to take Imodium.  Also discussed that Dr. Sondra Come will be notified about her skin irritation.  She did not have any further questions.

## 2018-06-07 ENCOUNTER — Telehealth: Payer: Self-pay | Admitting: Oncology

## 2018-06-07 ENCOUNTER — Inpatient Hospital Stay: Payer: Medicaid Other

## 2018-06-07 ENCOUNTER — Ambulatory Visit
Admission: RE | Admit: 2018-06-07 | Discharge: 2018-06-07 | Disposition: A | Discharge status: Home / Self Care | Payer: Medicaid Other | Source: Ambulatory Visit | Attending: Radiation Oncology | Admitting: Radiation Oncology

## 2018-06-07 VITALS — BP 130/67 | HR 74 | Temp 98.0°F | Resp 16

## 2018-06-07 DIAGNOSIS — Z510 Encounter for antineoplastic radiation therapy: Secondary | ICD-10-CM | POA: Diagnosis not present

## 2018-06-07 DIAGNOSIS — Z5111 Encounter for antineoplastic chemotherapy: Secondary | ICD-10-CM | POA: Diagnosis not present

## 2018-06-07 DIAGNOSIS — C539 Malignant neoplasm of cervix uteri, unspecified: Secondary | ICD-10-CM

## 2018-06-07 MED ORDER — SODIUM CHLORIDE 0.9 % IV SOLN
Freq: Once | INTRAVENOUS | Status: AC
  Administered 2018-06-07: 20 mL via INTRAVENOUS
  Filled 2018-06-07: qty 250

## 2018-06-07 MED ORDER — HEPARIN SOD (PORK) LOCK FLUSH 100 UNIT/ML IV SOLN
500.0000 [IU] | Freq: Once | INTRAVENOUS | Status: AC | PRN
  Administered 2018-06-07: 500 [IU]
  Filled 2018-06-07: qty 5

## 2018-06-07 MED ORDER — SODIUM CHLORIDE 0.9 % IV SOLN
INTRAVENOUS | Status: DC
  Administered 2018-06-07: 11:00:00 via INTRAVENOUS
  Filled 2018-06-07 (×2): qty 1000

## 2018-06-07 MED ORDER — SODIUM CHLORIDE 0.9% FLUSH
10.0000 mL | Freq: Once | INTRAVENOUS | Status: AC | PRN
  Administered 2018-06-07: 10 mL
  Filled 2018-06-07: qty 10

## 2018-06-07 NOTE — Telephone Encounter (Signed)
Left a message for [facility] with appointment for Nutrition consult on 06/09/18 at 1:45 pm.  Requested a return call.

## 2018-06-07 NOTE — Patient Instructions (Signed)
Dehydration, Adult Dehydration is when there is not enough fluid or water in your body. This happens when you lose more fluids than you take in. Dehydration can range from mild to very bad. It should be treated right away to keep it from getting very bad. Symptoms of mild dehydration may include:  Thirst.  Dry lips.  Slightly dry mouth.  Dry, warm skin.  Dizziness. Symptoms of moderate dehydration may include:  Very dry mouth.  Muscle cramps.  Dark pee (urine). Pee may be the color of tea.  Your body making less pee.  Your eyes making fewer tears.  Heartbeat that is uneven or faster than normal (palpitations).  Headache.  Light-headedness, especially when you stand up from sitting.  Fainting (syncope). Symptoms of very bad dehydration may include:  Changes in skin, such as: ? Cold and clammy skin. ? Blotchy (mottled) or pale skin. ? Skin that does not quickly return to normal after being lightly pinched and let go (poor skin turgor).  Changes in body fluids, such as: ? Feeling very thirsty. ? Your eyes making fewer tears. ? Not sweating when body temperature is high, such as in hot weather. ? Your body making very little pee.  Changes in vital signs, such as: ? Weak pulse. ? Pulse that is more than 100 beats a minute when you are sitting still. ? Fast breathing. ? Low blood pressure.  Other changes, such as: ? Sunken eyes. ? Cold hands and feet. ? Confusion. ? Lack of energy (lethargy). ? Trouble waking up from sleep. ? Short-term weight loss. ? Unconsciousness. Follow these instructions at home:  If told by your doctor, drink an ORS: ? Make an ORS by using instructions on the package. ? Start by drinking small amounts, about  cup (120 mL) every 5-10 minutes. ? Slowly drink more until you have had the amount that your doctor said to have.  Drink enough clear fluid to keep your pee clear or pale yellow. If you were told to drink an ORS, finish the ORS  first, then start slowly drinking clear fluids. Drink fluids such as: ? Water. Do not drink only water by itself. Doing that can make the salt (sodium) level in your body get too low (hyponatremia). ? Ice chips. ? Fruit juice that you have added water to (diluted). ? Low-calorie sports drinks.  Avoid: ? Alcohol. ? Drinks that have a lot of sugar. These include high-calorie sports drinks, fruit juice that does not have water added, and soda. ? Caffeine. ? Foods that are greasy or have a lot of fat or sugar.  Take over-the-counter and prescription medicines only as told by your doctor.  Do not take salt tablets. Doing that can make the salt level in your body get too high (hypernatremia).  Eat foods that have minerals (electrolytes). Examples include bananas, oranges, potatoes, tomatoes, and spinach.  Keep all follow-up visits as told by your doctor. This is important. Contact a doctor if:  You have belly (abdominal) pain that: ? Gets worse. ? Stays in one area (localizes).  You have a rash.  You have a stiff neck.  You get angry or annoyed more easily than normal (irritability).  You are more sleepy than normal.  You have a harder time waking up than normal.  You feel: ? Weak. ? Dizzy. ? Very thirsty.  You have peed (urinated) only a small amount of very dark pee during 6-8 hours. Get help right away if:  You have symptoms of   very bad dehydration.  You cannot drink fluids without throwing up (vomiting).  Your symptoms get worse with treatment.  You have a fever.  You have a very bad headache.  You are throwing up or having watery poop (diarrhea) and it: ? Gets worse. ? Does not go away.  You have blood or something green (bile) in your throw-up.  You have blood in your poop (stool). This may cause poop to look black and tarry.  You have not peed in 6-8 hours.  You pass out (faint).  Your heart rate when you are sitting still is more than 100 beats a  minute.  You have trouble breathing. This information is not intended to replace advice given to you by your health care provider. Make sure you discuss any questions you have with your health care provider. Document Released: 06/27/2009 Document Revised: 03/20/2016 Document Reviewed: 10/25/2015 Elsevier Interactive Patient Education  2018 Elsevier Inc.  

## 2018-06-08 ENCOUNTER — Inpatient Hospital Stay: Payer: Medicaid Other

## 2018-06-08 ENCOUNTER — Encounter: Payer: Self-pay | Admitting: Oncology

## 2018-06-08 ENCOUNTER — Ambulatory Visit
Admission: RE | Admit: 2018-06-08 | Discharge: 2018-06-08 | Disposition: A | Discharge status: Home / Self Care | Payer: Medicaid Other | Source: Ambulatory Visit | Attending: Radiation Oncology | Admitting: Radiation Oncology

## 2018-06-08 ENCOUNTER — Encounter: Payer: Self-pay | Admitting: General Practice

## 2018-06-08 VITALS — BP 116/71 | HR 81 | Temp 97.8°F | Resp 18

## 2018-06-08 DIAGNOSIS — C539 Malignant neoplasm of cervix uteri, unspecified: Secondary | ICD-10-CM

## 2018-06-08 DIAGNOSIS — D500 Iron deficiency anemia secondary to blood loss (chronic): Secondary | ICD-10-CM

## 2018-06-08 DIAGNOSIS — Z5111 Encounter for antineoplastic chemotherapy: Secondary | ICD-10-CM | POA: Diagnosis not present

## 2018-06-08 DIAGNOSIS — Z510 Encounter for antineoplastic radiation therapy: Secondary | ICD-10-CM | POA: Diagnosis not present

## 2018-06-08 DIAGNOSIS — F321 Major depressive disorder, single episode, moderate: Secondary | ICD-10-CM

## 2018-06-08 LAB — COMPREHENSIVE METABOLIC PANEL
ALK PHOS: 58 U/L (ref 38–126)
ALT: 12 U/L (ref 0–44)
AST: 13 U/L — AB (ref 15–41)
Albumin: 3.5 g/dL (ref 3.5–5.0)
Anion gap: 8 (ref 5–15)
BILIRUBIN TOTAL: 0.2 mg/dL — AB (ref 0.3–1.2)
BUN: 5 mg/dL — AB (ref 8–23)
CALCIUM: 8.3 mg/dL — AB (ref 8.9–10.3)
CO2: 28 mmol/L (ref 22–32)
CREATININE: 0.63 mg/dL (ref 0.44–1.00)
Chloride: 105 mmol/L (ref 98–111)
GFR calc Af Amer: >60 mL/min (ref >60)
GLUCOSE: 89 mg/dL (ref 70–99)
POTASSIUM: 3 mmol/L — AB (ref 3.5–5.1)
Sodium: 141 mmol/L (ref 135–145)
TOTAL PROTEIN: 6.5 g/dL (ref 6.5–8.1)

## 2018-06-08 LAB — SAMPLE TO BLOOD BANK

## 2018-06-08 LAB — CBC WITH DIFFERENTIAL/PLATELET
BASOS ABS: 0 10*3/uL (ref 0.0–0.1)
Basophils Relative: 0 %
Eosinophils Absolute: 0.1 10*3/uL (ref 0.0–0.5)
Eosinophils Relative: 3 %
HEMATOCRIT: 33.2 % — AB (ref 34.8–46.6)
HEMOGLOBIN: 10.8 g/dL — AB (ref 11.6–15.9)
LYMPHS PCT: 6 %
Lymphs Abs: 0.2 10*3/uL — ABNORMAL LOW (ref 0.9–3.3)
MCH: 26 pg (ref 25.1–34.0)
MCHC: 32.4 g/dL (ref 31.5–36.0)
MCV: 80.2 fL (ref 79.5–101.0)
MONO ABS: 0.5 10*3/uL (ref 0.1–0.9)
MONOS PCT: 14 %
NEUTROS ABS: 2.9 10*3/uL (ref 1.5–6.5)
NEUTROS PCT: 77 %
Platelets: 93 10*3/uL — ABNORMAL LOW (ref 145–400)
RBC: 4.14 MIL/uL (ref 3.70–5.45)
RDW: 26.1 % — AB (ref 11.2–14.5)
WBC: 3.7 10*3/uL — ABNORMAL LOW (ref 3.9–10.3)

## 2018-06-08 LAB — MAGNESIUM: Magnesium: 1.7 mg/dL (ref 1.7–2.4)

## 2018-06-08 MED ORDER — SODIUM CHLORIDE 0.9% FLUSH
10.0000 mL | Freq: Once | INTRAVENOUS | Status: AC
  Administered 2018-06-08: 10 mL
  Filled 2018-06-08: qty 10

## 2018-06-08 MED ORDER — SODIUM CHLORIDE 0.9 % IV SOLN
510.0000 mg | Freq: Once | INTRAVENOUS | Status: AC
  Administered 2018-06-08: 510 mg via INTRAVENOUS
  Filled 2018-06-08: qty 17

## 2018-06-08 MED ORDER — HEPARIN SOD (PORK) LOCK FLUSH 100 UNIT/ML IV SOLN
500.0000 [IU] | Freq: Once | INTRAVENOUS | Status: AC
  Administered 2018-06-08: 500 [IU]
  Filled 2018-06-08: qty 5

## 2018-06-08 MED ORDER — SODIUM CHLORIDE 0.9 % IV SOLN
Freq: Once | INTRAVENOUS | Status: AC
  Administered 2018-06-08: 11:00:00 via INTRAVENOUS
  Filled 2018-06-08: qty 1000

## 2018-06-08 NOTE — Progress Notes (Signed)
[facility] Spiritual Care Note  Followed up with Addalie and caregiver/housemate Fannie in infusion. Appreciate navigator Santiago Glad Hess/RN's assessment and consultation with Dr Alvy Bimler re Fannie's report of pt's confusion. During my encounter with Erin Kent today, she did not answer questions or show that she was processing what I was asking, which was a change from our encounters on Friday. Encouraged Fannie to consult with RN and MD whenever questions arise, particularly to seek medical advice about her assistance with pt's med management. Plan to f/u in infusion on Friday.   [place], [place], [facility] Pager [phone] Voicemail [phone]

## 2018-06-08 NOTE — Progress Notes (Signed)
Roselie's caregiver, [name], said that patient has been confused since she started taking the new medications (Zoloft, hydroxyzine and trazodone).  She has to give her direction on everything including dressing and eating.  She also said [name] was confused about where to go today for her infusion appointment.  She did not give her the medications this morning.    Advised Dr. Alvy Bimler of patient's condition and she recommended holding the hydroxyzine.  Advised Fannie and Mckenze to stop taking the hydroxyzine and that referral will be placed for an appointment with the [facility].  They verbalized understanding and agreement.

## 2018-06-08 NOTE — Addendum Note (Signed)
Addended by: Elmo Putt R on: 06/08/2018 01:04 PM   Modules accepted: Orders

## 2018-06-08 NOTE — Patient Instructions (Signed)

## 2018-06-09 ENCOUNTER — Ambulatory Visit
Admission: RE | Admit: 2018-06-09 | Discharge: 2018-06-09 | Disposition: A | Discharge status: Home / Self Care | Payer: Medicaid Other | Source: Ambulatory Visit | Attending: Radiation Oncology | Admitting: Radiation Oncology

## 2018-06-09 ENCOUNTER — Encounter: Payer: Self-pay | Admitting: Oncology

## 2018-06-09 ENCOUNTER — Inpatient Hospital Stay (HOSPITAL_BASED_OUTPATIENT_CLINIC_OR_DEPARTMENT_OTHER): Payer: Medicaid Other | Admitting: Hematology and Oncology

## 2018-06-09 ENCOUNTER — Inpatient Hospital Stay: Payer: Medicaid Other | Admitting: Nutrition

## 2018-06-09 ENCOUNTER — Encounter: Payer: Self-pay | Admitting: *Deleted

## 2018-06-09 ENCOUNTER — Inpatient Hospital Stay: Payer: Medicaid Other

## 2018-06-09 ENCOUNTER — Telehealth: Payer: Self-pay | Admitting: Hematology and Oncology

## 2018-06-09 ENCOUNTER — Other Ambulatory Visit: Payer: Self-pay | Admitting: Hematology and Oncology

## 2018-06-09 VITALS — BP 143/83 | HR 68 | Resp 18 | Ht 61.0 in | Wt 83.0 lb

## 2018-06-09 DIAGNOSIS — D509 Iron deficiency anemia, unspecified: Secondary | ICD-10-CM

## 2018-06-09 DIAGNOSIS — F321 Major depressive disorder, single episode, moderate: Secondary | ICD-10-CM

## 2018-06-09 DIAGNOSIS — D500 Iron deficiency anemia secondary to blood loss (chronic): Secondary | ICD-10-CM

## 2018-06-09 DIAGNOSIS — Z5111 Encounter for antineoplastic chemotherapy: Secondary | ICD-10-CM | POA: Diagnosis not present

## 2018-06-09 DIAGNOSIS — D61818 Other pancytopenia: Secondary | ICD-10-CM

## 2018-06-09 DIAGNOSIS — I700 Atherosclerosis of aorta: Secondary | ICD-10-CM

## 2018-06-09 DIAGNOSIS — Z79899 Other long term (current) drug therapy: Secondary | ICD-10-CM | POA: Diagnosis not present

## 2018-06-09 DIAGNOSIS — R400 Somnolence: Secondary | ICD-10-CM

## 2018-06-09 DIAGNOSIS — R197 Diarrhea, unspecified: Secondary | ICD-10-CM

## 2018-06-09 DIAGNOSIS — E860 Dehydration: Secondary | ICD-10-CM

## 2018-06-09 DIAGNOSIS — E876 Hypokalemia: Secondary | ICD-10-CM

## 2018-06-09 DIAGNOSIS — R6881 Early satiety: Secondary | ICD-10-CM

## 2018-06-09 DIAGNOSIS — Z510 Encounter for antineoplastic radiation therapy: Secondary | ICD-10-CM | POA: Diagnosis not present

## 2018-06-09 DIAGNOSIS — M810 Age-related osteoporosis without current pathological fracture: Secondary | ICD-10-CM

## 2018-06-09 DIAGNOSIS — C539 Malignant neoplasm of cervix uteri, unspecified: Secondary | ICD-10-CM

## 2018-06-09 DIAGNOSIS — K5900 Constipation, unspecified: Secondary | ICD-10-CM

## 2018-06-09 DIAGNOSIS — R64 Cachexia: Secondary | ICD-10-CM

## 2018-06-09 MED ORDER — SODIUM CHLORIDE 0.9 % IV SOLN
INTRAVENOUS | Status: DC
  Administered 2018-06-09: 12:00:00 via INTRAVENOUS
  Filled 2018-06-09 (×2): qty 1000

## 2018-06-09 MED ORDER — HEPARIN SOD (PORK) LOCK FLUSH 100 UNIT/ML IV SOLN
500.0000 [IU] | Freq: Once | INTRAVENOUS | Status: AC | PRN
  Administered 2018-06-09: 500 [IU]
  Filled 2018-06-09: qty 5

## 2018-06-09 MED ORDER — SODIUM CHLORIDE 0.9 % IV SOLN
Freq: Once | INTRAVENOUS | Status: AC
  Administered 2018-06-09: 12:00:00 via INTRAVENOUS
  Filled 2018-06-09: qty 250

## 2018-06-09 MED ORDER — SODIUM CHLORIDE 0.9% FLUSH
10.0000 mL | Freq: Once | INTRAVENOUS | Status: AC | PRN
  Administered 2018-06-09: 10 mL
  Filled 2018-06-09: qty 10

## 2018-06-09 NOTE — Telephone Encounter (Signed)
Gave pt avs and calendar with appts per 9/26 los.  °

## 2018-06-09 NOTE — Progress Notes (Signed)
[place] Work  Clinical Social Work was referred by [employer] for assessment of psychosocial needs.  Clinical Social Worker met with patient and caregiver, [name], to offer support and assess for needs.  CSW explored concerns, fears, risk factors for self harm, and supportive strengths.  Patient answered questions and responded with short answers.  Patient's "caregiver" (friend from [employer] under[employer]) spoke on behalf of patient throughout session.  Ms. Hazell has no family support in the area.  She worked at [employer] prior to cancer diagnosis.  She enjoys animals and loved walking her landlords dog prior to getting sick. CSW strongly encouraged patient keep any appointments with [facility] for supportive counseling/medication management.  CSW provided information regarding support services and encouraged them to call as needed.      [name], LCSW  Clinical Social Worker [facility]

## 2018-06-09 NOTE — Patient Instructions (Signed)
Dehydration, Adult Dehydration is when there is not enough fluid or water in your body. This happens when you lose more fluids than you take in. Dehydration can range from mild to very bad. It should be treated right away to keep it from getting very bad. Symptoms of mild dehydration may include:  Thirst.  Dry lips.  Slightly dry mouth.  Dry, warm skin.  Dizziness. Symptoms of moderate dehydration may include:  Very dry mouth.  Muscle cramps.  Dark pee (urine). Pee may be the color of tea.  Your body making less pee.  Your eyes making fewer tears.  Heartbeat that is uneven or faster than normal (palpitations).  Headache.  Light-headedness, especially when you stand up from sitting.  Fainting (syncope). Symptoms of very bad dehydration may include:  Changes in skin, such as: ? Cold and clammy skin. ? Blotchy (mottled) or pale skin. ? Skin that does not quickly return to normal after being lightly pinched and let go (poor skin turgor).  Changes in body fluids, such as: ? Feeling very thirsty. ? Your eyes making fewer tears. ? Not sweating when body temperature is high, such as in hot weather. ? Your body making very little pee.  Changes in vital signs, such as: ? Weak pulse. ? Pulse that is more than 100 beats a minute when you are sitting still. ? Fast breathing. ? Low blood pressure.  Other changes, such as: ? Sunken eyes. ? Cold hands and feet. ? Confusion. ? Lack of energy (lethargy). ? Trouble waking up from sleep. ? Short-term weight loss. ? Unconsciousness. Follow these instructions at home:  If told by your doctor, drink an ORS: ? Make an ORS by using instructions on the package. ? Start by drinking small amounts, about  cup (120 mL) every 5-10 minutes. ? Slowly drink more until you have had the amount that your doctor said to have.  Drink enough clear fluid to keep your pee clear or pale yellow. If you were told to drink an ORS, finish the ORS  first, then start slowly drinking clear fluids. Drink fluids such as: ? Water. Do not drink only water by itself. Doing that can make the salt (sodium) level in your body get too low (hyponatremia). ? Ice chips. ? Fruit juice that you have added water to (diluted). ? Low-calorie sports drinks.  Avoid: ? Alcohol. ? Drinks that have a lot of sugar. These include high-calorie sports drinks, fruit juice that does not have water added, and soda. ? Caffeine. ? Foods that are greasy or have a lot of fat or sugar.  Take over-the-counter and prescription medicines only as told by your doctor.  Do not take salt tablets. Doing that can make the salt level in your body get too high (hypernatremia).  Eat foods that have minerals (electrolytes). Examples include bananas, oranges, potatoes, tomatoes, and spinach.  Keep all follow-up visits as told by your doctor. This is important. Contact a doctor if:  You have belly (abdominal) pain that: ? Gets worse. ? Stays in one area (localizes).  You have a rash.  You have a stiff neck.  You get angry or annoyed more easily than normal (irritability).  You are more sleepy than normal.  You have a harder time waking up than normal.  You feel: ? Weak. ? Dizzy. ? Very thirsty.  You have peed (urinated) only a small amount of very dark pee during 6-8 hours. Get help right away if:  You have symptoms of   very bad dehydration.  You cannot drink fluids without throwing up (vomiting).  Your symptoms get worse with treatment.  You have a fever.  You have a very bad headache.  You are throwing up or having watery poop (diarrhea) and it: ? Gets worse. ? Does not go away.  You have blood or something green (bile) in your throw-up.  You have blood in your poop (stool). This may cause poop to look black and tarry.  You have not peed in 6-8 hours.  You pass out (faint).  Your heart rate when you are sitting still is more than 100 beats a  minute.  You have trouble breathing. This information is not intended to replace advice given to you by your health care provider. Make sure you discuss any questions you have with your health care provider. Document Released: 06/27/2009 Document Revised: 03/20/2016 Document Reviewed: 10/25/2015 Elsevier Interactive Patient Education  2018 Elsevier Inc.  

## 2018-06-09 NOTE — Progress Notes (Signed)
67 year old female diagnosed with cervical cancer.   She is a patient of Dr. Alvy Bimler.  Past medical history includes iron deficiency anemia.  Medications include Decadron, Imodium, Zofran, Compazine, and Zoloft.  Height: 5 feet 1 inch. Weight: 83 pounds. Usual body weight: 100 pounds.  Weight recorded as 90 pounds April 12, 2018. BMI: 15.68.  I met with both patient and caregiver, [name] in symptom management.   Patient is scheduled to get IV fluids today. Patient has not had much of an appetite however that has improved over the last day or so. Diarrhea also somewhat improved on Imodium and Metamucil. Caregiver reports 3 soft stools daily. Patient denies nausea. Nutrition focused physical exam reveals severe depletion of body fat, muscle mass, and ankle edema.  Nutrition diagnosis: Severe malnutrition in the context of chronic illness related to severe depletion of body fat, muscle mass, and edema.  Patient is also underweight with BMI of 15.68.  Intervention: I educated patient to try to increase calories, protein, and fluids in small amounts throughout the day. Patient tried Ensure clear and is able to drink it. Have also educated patient she can try Ensure Plus or boost plus because they are lactose-free.  Recommended she drink slowly. Educated patient and caregiver on high-protein foods. Encourage patient to try to eat the best she can. Fact sheets were given.  Questions were answered.  Teach back method used.  Contact information given.  Monitoring, evaluation, goals: Patient will tolerate increased calories and protein to minimize further weight loss.  Next visit: To be scheduled as needed.  **Disclaimer: This note was dictated with voice recognition software. Similar sounding words can inadvertently be transcribed and this note may contain transcription errors which may not have been corrected upon publication of note.**

## 2018-06-10 ENCOUNTER — Other Ambulatory Visit: Payer: Self-pay

## 2018-06-10 ENCOUNTER — Inpatient Hospital Stay: Payer: Medicaid Other

## 2018-06-10 ENCOUNTER — Emergency Department (HOSPITAL_COMMUNITY)
Admission: EM | Admit: 2018-06-10 | Discharge: 2018-06-11 | Disposition: A | Discharge status: Home / Self Care | Payer: Medicaid Other | Attending: Emergency Medicine | Admitting: Emergency Medicine

## 2018-06-10 ENCOUNTER — Ambulatory Visit
Admission: RE | Admit: 2018-06-10 | Discharge: 2018-06-10 | Disposition: A | Discharge status: Home / Self Care | Payer: Medicaid Other | Source: Ambulatory Visit | Attending: Radiation Oncology | Admitting: Radiation Oncology

## 2018-06-10 ENCOUNTER — Encounter (HOSPITAL_COMMUNITY): Payer: Self-pay | Admitting: *Deleted

## 2018-06-10 ENCOUNTER — Telehealth: Payer: Self-pay

## 2018-06-10 ENCOUNTER — Encounter: Payer: Self-pay | Admitting: General Practice

## 2018-06-10 ENCOUNTER — Encounter: Payer: Self-pay | Admitting: Hematology and Oncology

## 2018-06-10 ENCOUNTER — Telehealth: Payer: Self-pay | Admitting: Hematology and Oncology

## 2018-06-10 VITALS — BP 133/76 | HR 66 | Temp 98.1°F | Resp 18

## 2018-06-10 DIAGNOSIS — Z79899 Other long term (current) drug therapy: Secondary | ICD-10-CM | POA: Insufficient documentation

## 2018-06-10 DIAGNOSIS — F321 Major depressive disorder, single episode, moderate: Secondary | ICD-10-CM | POA: Diagnosis not present

## 2018-06-10 DIAGNOSIS — R443 Hallucinations, unspecified: Secondary | ICD-10-CM | POA: Diagnosis present

## 2018-06-10 DIAGNOSIS — Z510 Encounter for antineoplastic radiation therapy: Secondary | ICD-10-CM | POA: Diagnosis not present

## 2018-06-10 DIAGNOSIS — D61818 Other pancytopenia: Secondary | ICD-10-CM | POA: Insufficient documentation

## 2018-06-10 DIAGNOSIS — C539 Malignant neoplasm of cervix uteri, unspecified: Secondary | ICD-10-CM

## 2018-06-10 DIAGNOSIS — F29 Unspecified psychosis not due to a substance or known physiological condition: Secondary | ICD-10-CM | POA: Insufficient documentation

## 2018-06-10 DIAGNOSIS — Z5111 Encounter for antineoplastic chemotherapy: Secondary | ICD-10-CM | POA: Diagnosis not present

## 2018-06-10 LAB — COMPREHENSIVE METABOLIC PANEL
ALT: 15 U/L (ref 0–44)
ANION GAP: 10 (ref 5–15)
AST: 19 U/L (ref 15–41)
Albumin: 3.4 g/dL — ABNORMAL LOW (ref 3.5–5.0)
Alkaline Phosphatase: 49 U/L (ref 38–126)
BUN: 13 mg/dL (ref 8–23)
CHLORIDE: 104 mmol/L (ref 98–111)
CO2: 25 mmol/L (ref 22–32)
CREATININE: 0.58 mg/dL (ref 0.44–1.00)
Calcium: 9 mg/dL (ref 8.9–10.3)
GFR calc non Af Amer: >60 mL/min (ref >60)
Glucose, Bld: 104 mg/dL — ABNORMAL HIGH (ref 70–99)
POTASSIUM: 4.2 mmol/L (ref 3.5–5.1)
SODIUM: 139 mmol/L (ref 135–145)
Total Bilirubin: 0.7 mg/dL (ref 0.3–1.2)
Total Protein: 6.3 g/dL — ABNORMAL LOW (ref 6.5–8.1)

## 2018-06-10 LAB — RAPID URINE DRUG SCREEN, HOSP PERFORMED
Amphetamines: NOT DETECTED
Barbiturates: NOT DETECTED
Benzodiazepines: NOT DETECTED
Cocaine: NOT DETECTED
Opiates: NOT DETECTED
Tetrahydrocannabinol: NOT DETECTED

## 2018-06-10 LAB — ETHANOL

## 2018-06-10 LAB — CBC
HEMATOCRIT: 33.5 % — AB (ref 36.0–46.0)
Hemoglobin: 10.7 g/dL — ABNORMAL LOW (ref 12.0–15.0)
MCH: 26.4 pg (ref 26.0–34.0)
MCHC: 31.9 g/dL (ref 30.0–36.0)
MCV: 82.5 fL (ref 78.0–100.0)
Platelets: 121 10*3/uL — ABNORMAL LOW (ref 150–400)
RBC: 4.06 MIL/uL (ref 3.87–5.11)
RDW: 23 % — ABNORMAL HIGH (ref 11.5–15.5)
WBC: 2.8 10*3/uL — AB (ref 4.0–10.5)

## 2018-06-10 LAB — URINALYSIS, ROUTINE W REFLEX MICROSCOPIC
BILIRUBIN URINE: NEGATIVE
Bacteria, UA: NONE SEEN
Glucose, UA: NEGATIVE mg/dL
KETONES UR: NEGATIVE mg/dL
NITRITE: NEGATIVE
PH: 6 (ref 5.0–8.0)
Protein, ur: NEGATIVE mg/dL
Specific Gravity, Urine: 1.014 (ref 1.005–1.030)

## 2018-06-10 MED ORDER — LORAZEPAM 1 MG PO TABS: 1.0000 mg | ORAL_TABLET | Freq: Four times a day (QID) | ORAL | Status: DC | PRN

## 2018-06-10 MED ORDER — HEPARIN SOD (PORK) LOCK FLUSH 100 UNIT/ML IV SOLN
250.0000 [IU] | Freq: Once | INTRAVENOUS | Status: DC | PRN
  Filled 2018-06-10: qty 5

## 2018-06-10 MED ORDER — SODIUM CHLORIDE 0.9 % IV SOLN
Freq: Once | INTRAVENOUS | Status: AC
  Administered 2018-06-10: 09:00:00 via INTRAVENOUS
  Filled 2018-06-10: qty 250

## 2018-06-10 MED ORDER — LORAZEPAM 2 MG/ML IJ SOLN: 1.0000 mg | Freq: Four times a day (QID) | INTRAMUSCULAR | Status: DC | PRN

## 2018-06-10 MED ORDER — SODIUM CHLORIDE 0.9 % IV SOLN
Freq: Once | INTRAVENOUS | Status: AC
  Administered 2018-06-10: 11:00:00 via INTRAVENOUS
  Filled 2018-06-10: qty 1000

## 2018-06-10 MED ORDER — HALOPERIDOL LACTATE 5 MG/ML IJ SOLN: 5.0000 mg | Freq: Four times a day (QID) | INTRAMUSCULAR | Status: DC | PRN

## 2018-06-10 MED ORDER — SODIUM CHLORIDE 0.9% FLUSH
10.0000 mL | Freq: Once | INTRAVENOUS | Status: AC | PRN
  Administered 2018-06-10: 10 mL
  Filled 2018-06-10: qty 10

## 2018-06-10 MED ORDER — HALOPERIDOL 5 MG PO TABS: 5.0000 mg | ORAL_TABLET | Freq: Four times a day (QID) | ORAL | Status: DC | PRN

## 2018-06-10 MED ORDER — TRAZODONE HCL 50 MG PO TABS: 50.0000 mg | ORAL_TABLET | Freq: Every evening | ORAL | Status: DC | PRN

## 2018-06-10 MED ORDER — HEPARIN SOD (PORK) LOCK FLUSH 100 UNIT/ML IV SOLN
500.0000 [IU] | Freq: Once | INTRAVENOUS | Status: AC
  Administered 2018-06-10: 500 [IU]
  Filled 2018-06-10: qty 5

## 2018-06-10 NOTE — ED Notes (Signed)
TTS assessment in progress. 

## 2018-06-10 NOTE — ED Notes (Signed)
Bed: [facility] Expected date:  Expected time:  Means of arrival:  Comments: 

## 2018-06-10 NOTE — Assessment & Plan Note (Signed)
She has significant cachexia According to the caregiver, the patient lacked the motivation to eat She has preserved appetite I recommend her to take dexamethasone as appetite stimulant and to continue on nutritional supplement as tolerated

## 2018-06-10 NOTE — ED Triage Notes (Signed)
Pt arrives with her caregiver. Caregiver states that after therapy oncology treatment today, the pt was having hallucinations about snakes in the couch and toilet. Caregiver says the patient has been very anxious and hard to redirect. She was seen last week by psych after cutting her wrist with a razor blade. She was started on Zoloft, trazodone, and hydroxyzine (has since been stopped), caregiver has noticed increased hallucinations since starting these medications.

## 2018-06-10 NOTE — ED Triage Notes (Signed)
Patient appropriately responding to orientation questions, cooperative in triage. She denies SI, HI, or hallucinations. When asked why she is here "well of course I know, I tried to tell them what was going to happen to me, but they just will not listen." Poor eye contact, focusing and fidgeting with patient labels in her hand. She is ambulatory to restroom with steady gait.

## 2018-06-10 NOTE — Assessment & Plan Note (Signed)
She has progressive pancytopenia due to recent weight loss and poor oral intake I will hold her treatment today and continue supportive care For now, she does not need transfusion support

## 2018-06-10 NOTE — Telephone Encounter (Signed)
Per patient request added a port for labs. Per 9/27 walk in.

## 2018-06-10 NOTE — Assessment & Plan Note (Signed)
She had recent diarrhea I recommend scheduled Imodium and hydration support

## 2018-06-10 NOTE — ED Provider Notes (Signed)
[facility] DEPT Provider Note  CSN: [id number] Arrival date & time: 06/10/18  1901    History   Chief Complaint Chief Complaint  Patient presents with  . Hallucinations    HPI Erin Kent is a 67 y.o. female with a medical history of cervical cancer and psychiatric history of MDD who presented to the ED for psychosis. Patient states that she has a snake in her left groin area that she got while receiving infusion treatments. She states "I know what is in there and it's eventually going to show itself." Denies SI, HI or AVH. She has no specific physical complaints. Denies fever, chills, chest pain, SOB, abdominal pain, N/V or urinary complaints. She states that her cancer treatments have been going well. Patient reports positive sleep and appetite.  Additional history obtained by patient's caregiver. Caregiver states that over the last 2 weeks patient has been responding to internal stimuli, making illogical statements and has been behaving bizarrely. She states that patient has not been sleeping as much lately. There is no specific stressor or precipitating event, but states that it has gradually become more consistent. Caregiver is unaware of any past psychiatric issues with the patient. Patient cut her wrist with a razor blade last week and brought her to the ED for this, but denies that patient has had any other self-harming behaviors.  Past Medical History:  Diagnosis Date  . Cervical cancer ([place])   . Iron deficiency anemia     Patient Active Problem List   Diagnosis Date Noted  . Acquired pancytopenia ([place]) 06/10/2018  . Major depressive disorder, single episode, moderate ([place]) 06/04/2018  . Self-injurious behavior   . Diarrhea 05/30/2018  . Acute hypokalemia 05/26/2018  . Other constipation 05/19/2018  . Iron deficiency anemia 05/05/2018  . Cachexia ([place]) 05/05/2018  . Vaginal tumor   . Malignant neoplasm of cervix ([place]) 04/23/2018  .  OSTEOPOROSIS 10/10/2008  . HEMOCCULT POSITIVE STOOL 10/02/2008  . KYPHOSIS 10/02/2008  . DENTAL CARIES 05/24/2008  . 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 OVARIAN CYST, LEFT 10/14/2004    Past Surgical History:  Procedure Laterality Date  . CYSTOSCOPY N/A 04/28/2018   Procedure: CYSTOSCOPY;  Surgeon: Isabel Caprice, MD;  Location: [facility];  Service: Gynecology;  Laterality: N/A;  . EUA/ PAPSMEAR/  CERVICAL MASS BIOPSY  04-12-2018   dr peggy constant @[facility]   . IR IMAGING GUIDED PORT INSERTION  05/12/2018  . WISDOM TOOTH EXTRACTION       OB History    Gravida  0   Para  0   Term  0   Preterm  0   AB  0   Living  0     SAB  0   TAB  0   Ectopic  0   Multiple  0   Live Births  0            Home Medications    Prior to Admission medications   Medication Sig Start Date End Date Taking? Authorizing Provider  dexamethasone (DECADRON) 4 MG tablet Take 1 tablet (4 mg total) by mouth daily. 05/30/18   [name], MD  ibuprofen (ADVIL,MOTRIN) 600 MG tablet Take 1 tablet (600 mg total) by mouth every 6 (six) hours as needed. Patient not taking: Reported on 05/29/2018 04/12/18   Constant, Peggy, MD  lidocaine-prilocaine (EMLA) cream Apply to affected area once Patient taking differently: Apply 1 application topically as needed (for port acces).  05/10/18   Alvy Bimler,  Ni, MD  loperamide (IMODIUM A-D) 2 MG tablet Take 2 mg by mouth as needed for diarrhea or loose stools.    [provider]  ondansetron (ZOFRAN) 8 MG tablet Take 1 tablet (8 mg total) by mouth every 8 (eight) hours as needed. Start on the third day after chemotherapy. 05/10/18   [name], MD  oxyCODONE-acetaminophen (PERCOCET/ROXICET) 5-325 MG tablet Take 1-2 tablets by mouth every 6 (six) hours as needed. Patient not taking: Reported on 05/04/2018 04/12/18   Constant, Peggy, MD  prochlorperazine (COMPAZINE) 10 MG tablet Take 1 tablet (10 mg total) by mouth every 6 (six) hours as  needed (Nausea or vomiting). Patient not taking: Reported on 06/03/2018 05/10/18   [name], MD  sertraline (ZOLOFT) 25 MG tablet Take 1 tablet (25 mg total) by mouth daily. 06/06/18   Ethelene Hal, NP  traZODone (DESYREL) 50 MG tablet Take 1 tablet (50 mg total) by mouth at bedtime. 06/05/18   Ethelene Hal, NP    Family History Family History  Problem Relation Age of Onset  . Hypertension Mother     Social History Social History   Tobacco Use  . Smoking status: Never Smoker  . Smokeless tobacco: Never Used  Substance Use Topics  . Alcohol use: No  . Drug use: No     Allergies   Patient has no known allergies.   Review of Systems Review of Systems  Constitutional: Negative for chills and fever.  HENT: Negative.   Eyes: Negative.   Respiratory: Negative.   Cardiovascular: Negative.   Gastrointestinal: Negative.   Genitourinary: Negative.   Musculoskeletal: Negative.   Skin: Negative.   Neurological: Negative.   Psychiatric/Behavioral: Positive for hallucinations. Negative for sleep disturbance.     Physical Exam Updated Vital Signs BP 123/84 (BP Location: Left Arm)   Pulse 72   Temp 98 F (36.7 C) (Oral)   Resp 18   Ht 5\' 1"  (1.549 m)   Wt 37.6 kg   SpO2 96%   BMI 15.68 kg/m   Physical Exam  Constitutional: She is oriented to person, place, and time. Vital signs are normal. She is cooperative. No distress.  Very thin.  HENT:  Head: Normocephalic and atraumatic.  Mouth/Throat: Uvula is midline, oropharynx is clear and moist and mucous membranes are normal.  Eyes: Pupils are equal, round, and reactive to light. Conjunctivae, EOM and lids are normal.  Neck: Normal range of motion and full passive range of motion without pain. Neck supple.  Cardiovascular: Normal rate, regular rhythm and normal heart sounds.  No murmur heard. Pulmonary/Chest: Effort normal and breath sounds normal.  Abdominal: Soft. Normal appearance and bowel sounds are  normal. There is no tenderness.  Musculoskeletal:  Full ROM of upper and lower extremities bilaterally with 5/5 strength. No left groin abnormalities seen where patient is endorsing "snakes."  Neurological: She is alert and oriented to person, place, and time. She has normal strength. No cranial nerve deficit or sensory deficit. She exhibits normal muscle tone. Coordination and gait normal.  Reflex Scores:      Tricep reflexes are 2+ on the right side and 2+ on the left side.      Bicep reflexes are 2+ on the right side and 2+ on the left side.      Brachioradialis reflexes are 2+ on the right side and 2+ on the left side.      Patellar reflexes are 2+ on the right side and 2+ on the left side.  Achilles reflexes are 2+ on the right side and 2+ on the left side. Skin: Skin is warm and intact. Capillary refill takes 2 to 3 seconds.  Psychiatric: She has a normal mood and affect. Her speech is normal. She is actively hallucinating. Thought content is paranoid and delusional. Cognition and memory are normal.  Normal speech. Frequently looks around the room as if she is internally stimulated. Does not exhibit thought blocking or responds openly to internal stimuli. She is inattentive.  Nursing note and vitals reviewed.   ED Treatments / Results  Labs (all labs ordered are listed, but only abnormal results are displayed) Labs Reviewed  COMPREHENSIVE METABOLIC PANEL - Abnormal; Notable for the following components:      Result Value   Glucose, Bld 104 (*)    Total Protein 6.3 (*)    Albumin 3.4 (*)    All other components within normal limits  CBC - Abnormal; Notable for the following components:   WBC 2.8 (*)    Hemoglobin 10.7 (*)    HCT 33.5 (*)    RDW 23.0 (*)    Platelets 121 (*)    All other components within normal limits  URINALYSIS, ROUTINE W REFLEX MICROSCOPIC - Abnormal; Notable for the following components:   Hgb urine dipstick SMALL (*)    Leukocytes, UA MODERATE (*)     All other components within normal limits  ETHANOL  RAPID URINE DRUG SCREEN, HOSP PERFORMED    EKG None  Radiology No results found.  Procedures Procedures (including critical care time)  Medications Ordered in ED Medications - No data to display   Initial Impression / Assessment and Plan / ED Course  Triage vital signs and the nursing notes have been reviewed.  Pertinent labs & imaging results that were available during care of the patient were reviewed and considered in medical decision making (see chart for details).  Patient presents to the ED via her caregiver with concerns for psychosis. Per collateral from caregiver, patient has been behaving more bizarrely, responding to internal stimuli and has more disorganized thoughts over the last 2 weeks. Caregiver is unaware of patient's psychiatric history which makes it difficult to delineate if this behavior is new or part of an underlying psychotic disorder.  Patient denies AVH, but endorses tactile hallucination and delusion that she has "snakes in her leg" from her infusion treatments. No previous documentation to support a primary psychotic disorder or mood disorder with psychosis. Physical exam is normal and there are no focal neuro deficits which raises concern for a primary intracranial source to these new hallucinations. Will order basic blood work necessary prior TTS evaluation.  Clinical Course as of Jun 10 2214  Fri Jun 10, 2018  2040 Leukpenic which is expected given pt's current cancer treatment. Remaining lab work unremarkable. From a medical perspective, patient is cleared. Will consult TTS for further evaluation.   [GM]  2213 TTS provider called and stated inpatient psychatric treatment is recommended for this patient.   [GM]    Clinical Course User Index [GM] Bernardo Brayman, Jonelle Sports, PA-C   Final Clinical Impressions(s) / ED Diagnoses  1. Psychosis. Consult placed to TTS for further evaluation. PRN medications for  sleep and agitation ordered.  Dispo: TTS recommends inpatient psychiatric hospitalization.  Final diagnoses:  Psychosis, unspecified psychosis type [facility])    ED Discharge Orders    None        Junita Push 06/10/18 2215    Carmin Muskrat, MD 06/11/18  0017  

## 2018-06-10 NOTE — Telephone Encounter (Signed)
Per 9/26 los - left message for patient with IVF appt added on 10/3

## 2018-06-10 NOTE — BH Assessment (Addendum)
Tele Assessment Note   Patient Name: Erin Kent MRN: [id number] Referring Physician: PA Maury Dus Location of Patient: [facility] ED Location of Provider: [place] is an 67 y.o. female.  The pt came in due to having hallucinations of seeing snakes.  When asked about the snakes by TTS, the pt would not discuss her hallucinations. According to previous notes, the pt was seeing snakes in the couch and toilet.  The pt told the PA there were snakes in her legs where she gets her infusion.  The pt cut herself on the arm about a week and a half ago on purpose.  The pt denies wanting to kill herself.  She stated she is stressed about living with cancer.  The pt denies ever seeing a counselor or psychiatrist in the past.  The pt has never been inpatient for mental health reasons in the past.    The pt lives with a caregiver.  She denies SI, HI, legal issues, and history of abuse.  The pt stated she is sleeping well and has a good appetite.  She denies symptoms of depression and SA.  Pt is dressed in scrubs. She is alert and oriented x4. Pt speaks in a clear tone, at moderate volume and normal pace. Eye contact is good. Pt's affect is flat. Thought process is coherent and relevant. There is no indication Pt is currently responding to internal stimuli or experiencing delusional thought content.?Pt was cooperative throughout assessment.      Diagnosis: F23 Brief psychotic disorder  Past Medical History:  Past Medical History:  Diagnosis Date  . Cervical cancer ([place])   . Iron deficiency anemia     Past Surgical History:  Procedure Laterality Date  . CYSTOSCOPY N/A 04/28/2018   Procedure: CYSTOSCOPY;  Surgeon: Isabel Caprice, MD;  Location[facility];  Service: Gynecology;  Laterality: N/A;  . EUA/ PAPSMEAR/  CERVICAL MASS BIOPSY  04-12-2018   dr peggy constant @[facility]   . IR IMAGING GUIDED PORT INSERTION  05/12/2018  . WISDOM TOOTH EXTRACTION       Family History:  Family History  Problem Relation Age of Onset  . Hypertension Mother     Social History:  reports that she has never smoked. She has never used smokeless tobacco. She reports that she does not drink alcohol or use drugs.  Additional Social History:  Alcohol / Drug Use Pain Medications: See MAR Prescriptions: See MAR Over the Counter: See MAR History of alcohol / drug use?: No history of alcohol / drug abuse Longest period of sobriety (when/how long): NA  CIWA: CIWA-Ar BP: 123/84 Pulse Rate: 72 COWS:    Allergies: No Known Allergies  Home Medications:  (Not in a hospital admission)  OB/GYN Status:  No LMP recorded. Patient is postmenopausal.  General Assessment Data [facility] of Assessment: [facility] TTS Assessment: In system Is this a Tele or Face-to-Face Assessment?: Face-to-Face Is this an Initial Assessment or a Re-assessment for this encounter?: Initial Assessment Patient Accompanied by:: N/A Language Other than English: No Living Arrangements: Other (Comment)(at home) What gender do you identify as?: Female Marital status: Single Maiden name: [place] Pregnancy Status: No Living Arrangements: Other (Comment)(care giver) Can pt return to current living arrangement?: Yes Admission Status: Voluntary Is patient capable of signing voluntary admission?: Yes Referral Source: Self/Family/Friend Insurance type: Medicaid     Crisis Care Plan Living Arrangements: Other (Comment)(care giver) Legal Guardian: Other:(Self) Name of Psychiatrist: None Name of Therapist:  None  Education Status Is patient currently in school?: No Is the patient employed, unemployed or receiving disability?: Unemployed  Risk to self with the past 6 months Suicidal Ideation: No Has patient been a risk to self within the past 6 months prior to admission? : No Suicidal Intent: No Has patient had any suicidal intent within the past 6 months prior to admission? : No Is  patient at risk for suicide?: No Suicidal Plan?: No Has patient had any suicidal plan within the past 6 months prior to admission? : No Access to Means: No What has been your use of drugs/alcohol within the last 12 months?: none Previous Attempts/Gestures: No How many times?: 0 Other Self Harm Risks: none Triggers for Past Attempts: None known Intentional Self Injurious Behavior: Cutting Comment - Self Injurious Behavior: cut self for the first time a week and a half ago Family Suicide History: No Recent stressful life event(s): Trauma (Comment)(has cervical cancer) Persecutory voices/beliefs?: No Depression: No Substance abuse history and/or treatment for substance abuse?: No Suicide prevention information given to non-admitted patients: Yes  Risk to Others within the past 6 months Homicidal Ideation: No Does patient have any lifetime risk of violence toward others beyond the six months prior to admission? : No Thoughts of Harm to Others: No Current Homicidal Intent: No Current Homicidal Plan: No Access to Homicidal Means: No Identified Victim: none History of harm to others?: No Assessment of Violence: None Noted Violent Behavior Description: none Does patient have access to weapons?: No Criminal Charges Pending?: No Does patient have a court date: No Is patient on probation?: No  Psychosis Hallucinations: Visual Delusions: None noted  Mental Status Report Appearance/Hygiene: In scrubs Eye Contact: Good Motor Activity: Unable to assess Speech: Soft, Slow Level of Consciousness: Alert Mood: Depressed Affect: Sad Anxiety Level: None Thought Processes: Coherent, Relevant Judgement: Partial Orientation: Person, Place, Time, Situation Obsessive Compulsive Thoughts/Behaviors: None  Cognitive Functioning Concentration: Normal Memory: Recent Intact, Remote Intact Is patient IDD: No Insight: Fair Impulse Control: Fair Appetite: Good Have you had any weight changes?  : No Change Sleep: No Change Total Hours of Sleep: 8 Vegetative Symptoms: None  ADLScreening [facility] Assessment Services) Patient's cognitive ability adequate to safely complete daily activities?: Yes Patient able to express need for assistance with ADLs?: Yes Independently performs ADLs?: Yes (appropriate for developmental age)  Prior Inpatient Therapy Prior Inpatient Therapy: No  Prior Outpatient Therapy Prior Outpatient Therapy: No Does patient have an ACCT team?: No Does patient have Intensive In-House Services?  : No Does patient have [facility] services? : No Does patient have P4CC services?: No  ADL Screening (condition at time of admission) Patient's cognitive ability adequate to safely complete daily activities?: Yes Patient able to express need for assistance with ADLs?: Yes Independently performs ADLs?: Yes (appropriate for developmental age)       Abuse/Neglect Assessment (Assessment to be complete while patient is alone) Abuse/Neglect Assessment Can Be Completed: Yes Physical Abuse: Denies Verbal Abuse: Denies Sexual Abuse: Denies Exploitation of patient/patient's resources: Denies Self-Neglect: Denies Values / Beliefs Cultural Requests During Hospitalization: None Spiritual Requests During Hospitalization: None Consults Spiritual Care Consult Needed: No Social Work Consult Needed: No Regulatory affairs officer (For Healthcare) Does Patient Have a Medical Advance Directive?: No Would patient like information on creating a medical advance directive?: No - Patient declined          Disposition:  Disposition Initial Assessment Completed for this Encounter: Yes Patient referred to: Other (Comment)([facility] psych)   PA Frederico Hamman  Simon recommends inpatient treatment.  RN Margaretha Sheffield and PA Alvie Heidelberg were made aware of the recommendation.  This service was provided via telemedicine using a 2-way, interactive audio and video technology.  Names of all persons participating in  this telemedicine service and their role in this encounter. Name: Gennie Eisinger Role: pt  Name: Virgina Organ Role: TTS  Name:  Role:   Name:  Role:     Enzo Montgomery 06/10/2018 10:47 PM

## 2018-06-10 NOTE — Progress Notes (Signed)
[place] OFFICE PROGRESS NOTE  Patient Care Team: Patient, [name] as PCP - General (General Practice)  ASSESSMENT & PLAN:  Malignant neoplasm of cervix ([place]) She has difficulties tolerating treatment She has progressive pancytopenia, loss of appetite and weight loss I will hold treatment this week and reschedule We will continue aggressive supportive care.  Acute hypokalemia This is due to recent diarrhea and poor oral intake I will give her potassium replacement therapy and recommend potassium rich diet  Acquired pancytopenia ([place]) She has progressive pancytopenia due to recent weight loss and poor oral intake I will hold her treatment today and continue supportive care For now, she does not need transfusion support  Major depressive disorder, single episode, moderate ([place]) She is started on antidepressant Her mood has stabilized She denies suicidal ideation. We will arrange for outpatient psychiatry visit.  Iron deficiency anemia She has received 2 doses of intravenous iron Her anemia is stable I plan to recheck iron studies next week  Diarrhea She had recent diarrhea I recommend scheduled Imodium and hydration support  Cachexia ([place]) She has significant cachexia According to the caregiver, the patient lacked the motivation to eat She has preserved appetite I recommend her to take dexamethasone as appetite stimulant and to continue on nutritional supplement as tolerated   No orders of the defined types were placed in this encounter.   INTERVAL HISTORY: Please see below for problem oriented charting. She returns with caregiver for further follow-up She missed her treatment last week due to severe depression She was started on antidepressant along with hydroxyzine and developed sedation.  Hydroxyzine was discontinued According to her caregiver, her mood has improved The patient denies suicidal ideation and would like to continue on  treatment She has some mild diarrhea with treatment but is not taking Imodium consistently She felt the IV fluid is helping her to keep her hydration status She has declined home infusion therapy She denies nausea No further vaginal bleeding. She denies recent infection, fever or chills  SUMMARY OF ONCOLOGIC HISTORY:   Malignant neoplasm of cervix ([place])   03/13/2018 Imaging    US pelvis There are mobile echogenic foci within the endometrial cavity suggestive of gas. This is nonspecific in etiology however may be secondary to endometritis. Correlate for history of recent instrumentation. This obscures visualization of the endometrial tissue and therefore a follow-up pelvic ultrasound in 2-4 weeks is recommended after resolution of the acute symptomatology if symptoms persist to further evaluate the endometrium.     03/13/2018 Initial Diagnosis    She initially presented with PMB    04/12/2018 Pathology Results    Cervix, biopsy, mass - INVASIVE SQUAMOUS CELL CARCINOMA - SEE COMMENT    04/12/2018 Surgery    PREOPERATIVE DIAGNOSIS:  Postmenopausal vaginal bleeding. POSTOPERATIVE DIAGNOSIS: The same PROCEDURE: Exam under anesthesia, pap smear, cervical mass biopsy SURGEON:  Dr. [name]  INDICATIONS: 67 y.o. yo [id number] with PMB here for exam under anesthesia.Risks of surgery were discussed with the patient including but not limited to: bleeding which may require transfusion; infection which may require antibiotics; injury to uterus or surrounding organs; need for additional procedures including laparotomy or laparoscopy; and other postoperative/anesthesia complications. Written informed consent was obtained.    FINDINGS:  An 8-week size midline uterus.  No adnexal mass palpable on exam. Normal cervix not visualized. Friable mass seen in involving the vagina at the level of the cervix obliterating the anterior and posterior fornix.  ANESTHESIA:   General INTRAVENOUS  FLUIDS:  300  ml of LR ESTIMATED BLOOD LOSS: 20 ml. SPECIMENS: pap smear, cervical mass biopsy COMPLICATIONS:  None immediate.     04/25/2018 PET scan    1. Large cervical mass may invade into the myometrium in down into the vagina, maximum SUV 21.3. There is a malignant left external iliac node measuring 1.4 cm in short axis with maximum SUV 14.1. 2. Accentuated activity in the cecum and ascending colon is likely physiologic given that it has no CT correlate. Correlation with the patient's colon cancer screening history is recommended. If screening is not up-to-date, appropriate screening should be considered. 3.  Aortic Atherosclerosis (ICD10-I70.0).     04/28/2018 Surgery    Pre-operative Diagnosis:  At least Stage 2B SCCa Cervical Cancer  Post-operative Diagnosis: At least clinical Stage 3A SCCa Cervical Cancer  Operation:  Exam under anesthesia due to intolerance for vaginal exam in office Cystoscopy due to concern for extension to posterior bladder (from anterior vaginal wall lesion)  Operative Findings:   1. Parametrial involvement on right  2. Right sided subvaginal tumor burden ~3cm. This approximates the right ureteral insertion into the bladder based on my exam during cystoscopy. 3. Extension of disease down upper half of vagina on lateral and posterior walls. 4. Extension of disease down to lower third of anterior vagina (versus skip lesion) thus making her Stage 3A 5. Cystoscopy revealed patent bilateral ureteral orifices and no bladder mucosa invasion or areas of concern. 6. Rectal exam grossly no disease.     05/04/2018 Cancer Staging    Staging form: Cervix Uteri, AJCC 8th Edition - Clinical: Stage IIIB (cT3b, cN1, cM0) - Signed by [name], MD on 05/04/2018    05/12/2018 Procedure    Successful 8 French right internal jugular vein power port placement with its tip at the SVC/RA junction    05/13/2018 -  Chemotherapy    The patient had weekly cisplatin given with concurrent  radiation     06/03/2018 Adverse Reaction    She missed her chemo due to severe depression and was sent to the ER for suicidal ideation.  She was seen by psychiatrist.     REVIEW OF SYSTEMS:   Constitutional: Denies fevers, chills or abnormal weight loss Eyes: Denies blurriness of vision Ears, nose, mouth, throat, and face: Denies mucositis or sore throat Respiratory: Denies cough, dyspnea or wheezes Cardiovascular: Denies palpitation, chest discomfort or lower extremity swellinghabits Skin: Denies abnormal skin rashes Lymphatics: Denies new lymphadenopathy or easy bruising All other systems were reviewed with the patient and are negative.  I have reviewed the past medical history, past surgical history, social history and family history with the patient and they are unchanged from previous note.  ALLERGIES:  has No Known Allergies.  MEDICATIONS:  Current Outpatient Medications  Medication Sig Dispense Refill  . dexamethasone (DECADRON) 4 MG tablet Take 1 tablet (4 mg total) by mouth daily. 30 tablet 0  . ibuprofen (ADVIL,MOTRIN) 600 MG tablet Take 1 tablet (600 mg total) by mouth every 6 (six) hours as needed. (Patient not taking: Reported on 05/29/2018) 60 tablet 3  . lidocaine-prilocaine (EMLA) cream Apply to affected area once (Patient taking differently: Apply 1 application topically as needed (for port acces). ) 30 g 3  . loperamide (IMODIUM A-D) 2 MG tablet Take 2 mg by mouth as needed for diarrhea or loose stools.    . ondansetron (ZOFRAN) 8 MG tablet Take 1 tablet (8 mg total) by mouth every 8 (eight) hours as needed.  Start on the third day after chemotherapy. 30 tablet 1  . oxyCODONE-acetaminophen (PERCOCET/ROXICET) 5-325 MG tablet Take 1-2 tablets by mouth every 6 (six) hours as needed. (Patient not taking: Reported on 05/04/2018) 20 tablet 0  . prochlorperazine (COMPAZINE) 10 MG tablet Take 1 tablet (10 mg total) by mouth every 6 (six) hours as needed (Nausea or vomiting).  (Patient not taking: Reported on 06/03/2018) 30 tablet 1  . sertraline (ZOLOFT) 25 MG tablet Take 1 tablet (25 mg total) by mouth daily. 30 tablet 0  . traZODone (DESYREL) 50 MG tablet Take 1 tablet (50 mg total) by mouth at bedtime. 30 tablet 0   No current facility-administered medications for this visit.    Facility-Administered Medications Ordered in Other Visits  Medication Dose Route Frequency Provider Last Rate Last Dose  . heparin lock flush 100 unit/mL  250 Units Intracatheter Once PRN Alvy Bimler, Abdul Beirne, MD      . sodium chloride 0.9 % 1,000 mL with potassium chloride 20 mEq infusion   Intravenous Continuous [name], MD   Stopped at 06/09/18 1406  . sodium chloride 0.9 % 1,000 mL with potassium chloride 40 mEq infusion   Intravenous Continuous Javian Nudd, MD      . sodium chloride flush (NS) 0.9 % injection 10 mL  10 mL Intracatheter Once PRN Alvy Bimler, Quana Chamberlain, MD        PHYSICAL EXAMINATION: ECOG PERFORMANCE STATUS: 2 - Symptomatic, <50% confined to bed  Vitals:   06/09/18 1431  BP: (!) 133/99   There were no vitals filed for this visit.  GENERAL:alert, no distress and comfortable.  She looks thin and cachectic  sKIN: skin color, texture, turgor are normal, no rashes or significant lesions EYES: normal, Conjunctiva are pink and non-injected, sclera clear OROPHARYNX:no exudate, no erythema and lips, buccal mucosa, and tongue normal  NECK: supple, thyroid normal size, non-tender, without nodularity LYMPH:  no palpable lymphadenopathy in the cervical, axillary or inguinal LUNGS: clear to auscultation and percussion with normal breathing effort HEART: regular rate & rhythm and no murmurs and no lower extremity edema ABDOMEN:abdomen soft, non-tender and normal bowel sounds Musculoskeletal:no cyanosis of digits and no clubbing  NEURO: alert & oriented x 3 with fluent speech, no focal motor/sensory deficits  LABORATORY DATA:  I have reviewed the data as listed    Component Value  Date/Time   NA 141 06/08/2018 1452   K 3.0 (LL) 06/08/2018 1452   CL 105 06/08/2018 1452   CO2 28 06/08/2018 1452   GLUCOSE 89 06/08/2018 1452   BUN 5 (L) 06/08/2018 1452   CREATININE 0.63 06/08/2018 1452   CALCIUM 8.3 (L) 06/08/2018 1452   PROT 6.5 06/08/2018 1452   ALBUMIN 3.5 06/08/2018 1452   AST 13 (L) 06/08/2018 1452   ALT 12 06/08/2018 1452   ALKPHOS 58 06/08/2018 1452   BILITOT 0.2 (L) 06/08/2018 1452   GFRNONAA >60 06/08/2018 1452   GFRAA >60 06/08/2018 1452    No results found for: SPEP, UPEP  Lab Results  Component Value Date   WBC 3.7 (L) 06/08/2018   NEUTROABS 2.9 06/08/2018   HGB 10.8 (L) 06/08/2018   HCT 33.2 (L) 06/08/2018   MCV 80.2 06/08/2018   PLT 93 (L) 06/08/2018      Chemistry      Component Value Date/Time   NA 141 06/08/2018 1452   K 3.0 (LL) 06/08/2018 1452   CL 105 06/08/2018 1452   CO2 28 06/08/2018 1452   BUN 5 (L) 06/08/2018 1452  CREATININE 0.63 06/08/2018 1452      Component Value Date/Time   CALCIUM 8.3 (L) 06/08/2018 1452   ALKPHOS 58 06/08/2018 1452   AST 13 (L) 06/08/2018 1452   ALT 12 06/08/2018 1452   BILITOT 0.2 (L) 06/08/2018 1452       RADIOGRAPHIC STUDIES: I have personally reviewed the radiological images as listed and agreed with the findings in the report. Ir Imaging Guided Port Insertion  Result Date: 05/12/2018 CLINICAL DATA:  Cervical cancer EXAM: TUNNEL POWER PORT PLACEMENT WITH SUBCUTANEOUS POCKET UTILIZING ULTRASOUND & FLOUROSCOPY FLUOROSCOPY TIME:  54 seconds.  Three mGy. MEDICATIONS AND MEDICAL HISTORY: Versed 2 mg, Fentanyl 100 mcg. Additional Medications: Ancef 2 g. Antibiotics were given within 2 hours of the procedure. ANESTHESIA/SEDATION: Moderate sedation time: 34 minutes. Nursing monitored the the patient during the procedure. PROCEDURE: After written informed consent was obtained, patient was placed in the supine position on angiographic table. The right neck and chest was prepped and draped in a  sterile fashion. Lidocaine was utilized for local anesthesia. The right jugular vein was noted to be patent initially with ultrasound. Under sonographic guidance, a micropuncture needle was inserted into the right IJ vein (Ultrasound and fluoroscopic image documentation was performed). The needle was removed over an 018 wire which was exchanged for a Amplatz. This was advanced into the IVC. An 8-French dilator was advanced over the Amplatz. A small incision was made in the right upper chest over the anterior right second rib. Utilizing blunt dissection, a subcutaneous pocket was created in the caudal direction. The pocket was irrigated with a copious amount of sterile normal saline. The port catheter was tunneled from the chest incision, and out the neck incision. The reservoir was inserted into the subcutaneous pocket and secured with two 3-0 Ethilon stitches. A peel-away sheath was advanced over the Amplatz wire. The port catheter was cut to measure length and inserted through the peel-away sheath. The peel-away sheath was removed. The chest incision was closed with 3-0 Vicryl interrupted stitches for the subcutaneous tissue and a running of 4-0 Vicryl subcuticular stitch for the skin. The neck incision was closed with a 4-0 Vicryl subcuticular stitch. Derma-bond was applied to both surgical incisions. The port reservoir was flushed and instilled with heparinized saline. No complications. FINDINGS: A right IJ vein Port-A-Cath is in place with its tip at the cavoatrial junction. COMPLICATIONS: None IMPRESSION: Successful 8 French right internal jugular vein power port placement with its tip at the SVC/RA junction. Electronically Signed   By: Marybelle Killings M.D.   On: 05/12/2018 15:08    All questions were answered. The patient knows to call the clinic with any problems, questions or concerns. No barriers to learning was detected.  I spent 30 minutes counseling the patient face to face. The total time spent in  the appointment was 40 minutes and more than 50% was on counseling and review of test results  [name], MD 06/10/2018 9:23 AM

## 2018-06-10 NOTE — Assessment & Plan Note (Signed)
This is due to recent diarrhea and poor oral intake I will give her potassium replacement therapy and recommend potassium rich diet

## 2018-06-10 NOTE — Assessment & Plan Note (Signed)
She has received 2 doses of intravenous iron Her anemia is stable I plan to recheck iron studies next week

## 2018-06-10 NOTE — Assessment & Plan Note (Signed)
She is started on antidepressant Her mood has stabilized She denies suicidal ideation. We will arrange for outpatient psychiatry visit.

## 2018-06-10 NOTE — Progress Notes (Signed)
[place] Spiritual Care Note  Ms Batty engaged in conversation today in significant contrast to our last encounter. She reports feeling very uplifted by many prayers and cards from members of her church. Per pt, no other needs at this time.   [place], [place], [facility] Pager [phone] Voicemail [phone]

## 2018-06-10 NOTE — Patient Instructions (Addendum)
Dehydration, Adult Dehydration is when there is not enough fluid or water in your body. This happens when you lose more fluids than you take in. Dehydration can range from mild to very bad. It should be treated right away to keep it from getting very bad. Symptoms of mild dehydration may include:  Thirst.  Dry lips.  Slightly dry mouth.  Dry, warm skin.  Dizziness. Symptoms of moderate dehydration may include:  Very dry mouth.  Muscle cramps.  Dark pee (urine). Pee may be the color of tea.  Your body making less pee.  Your eyes making fewer tears.  Heartbeat that is uneven or faster than normal (palpitations).  Headache.  Light-headedness, especially when you stand up from sitting.  Fainting (syncope). Symptoms of very bad dehydration may include:  Changes in skin, such as: ? Cold and clammy skin. ? Blotchy (mottled) or pale skin. ? Skin that does not quickly return to normal after being lightly pinched and let go (poor skin turgor).  Changes in body fluids, such as: ? Feeling very thirsty. ? Your eyes making fewer tears. ? Not sweating when body temperature is high, such as in hot weather. ? Your body making very little pee.  Changes in vital signs, such as: ? Weak pulse. ? Pulse that is more than 100 beats a minute when you are sitting still. ? Fast breathing. ? Low blood pressure.  Other changes, such as: ? Sunken eyes. ? Cold hands and feet. ? Confusion. ? Lack of energy (lethargy). ? Trouble waking up from sleep. ? Short-term weight loss. ? Unconsciousness. Follow these instructions at home:  If told by your doctor, drink an ORS: ? Make an ORS by using instructions on the package. ? Start by drinking small amounts, about  cup (120 mL) every 5-10 minutes. ? Slowly drink more until you have had the amount that your doctor said to have.  Drink enough clear fluid to keep your pee clear or pale yellow. If you were told to drink an ORS, finish the ORS  first, then start slowly drinking clear fluids. Drink fluids such as: ? Water. Do not drink only water by itself. Doing that can make the salt (sodium) level in your body get too low (hyponatremia). ? Ice chips. ? Fruit juice that you have added water to (diluted). ? Low-calorie sports drinks.  Avoid: ? Alcohol. ? Drinks that have a lot of sugar. These include high-calorie sports drinks, fruit juice that does not have water added, and soda. ? Caffeine. ? Foods that are greasy or have a lot of fat or sugar.  Take over-the-counter and prescription medicines only as told by your doctor.  Do not take salt tablets. Doing that can make the salt level in your body get too high (hypernatremia).  Eat foods that have minerals (electrolytes). Examples include bananas, oranges, potatoes, tomatoes, and spinach.  Keep all follow-up visits as told by your doctor. This is important. Contact a doctor if:  You have belly (abdominal) pain that: ? Gets worse. ? Stays in one area (localizes).  You have a rash.  You have a stiff neck.  You get angry or annoyed more easily than normal (irritability).  You are more sleepy than normal.  You have a harder time waking up than normal.  You feel: ? Weak. ? Dizzy. ? Very thirsty.  You have peed (urinated) only a small amount of very dark pee during 6-8 hours. Get help right away if:  You have symptoms of   very bad dehydration.  You cannot drink fluids without throwing up (vomiting).  Your symptoms get worse with treatment.  You have a fever.  You have a very bad headache.  You are throwing up or having watery poop (diarrhea) and it: ? Gets worse. ? Does not go away.  You have blood or something green (bile) in your throw-up.  You have blood in your poop (stool). This may cause poop to look black and tarry.  You have not peed in 6-8 hours.  You pass out (faint).  Your heart rate when you are sitting still is more than 100 beats a  minute.  You have trouble breathing. This information is not intended to replace advice given to you by your health care provider. Make sure you discuss any questions you have with your health care provider. Document Released: 06/27/2009 Document Revised: 03/20/2016 Document Reviewed: 10/25/2015 Elsevier Interactive Patient Education  2018 Elsevier Inc.   Hypokalemia Hypokalemia means that the amount of potassium in the blood is lower than normal.Potassium is a chemical that helps regulate the amount of fluid in the body (electrolyte). It also stimulates muscle tightening (contraction) and helps nerves work properly.Normally, most of the body's potassium is inside of cells, and only a very small amount is in the blood. Because the amount in the blood is so small, minor changes to potassium levels in the blood can be life-threatening. What are the causes? This condition may be caused by:  Antibiotic medicine.  Diarrhea or vomiting. Taking too much of a medicine that helps you have a bowel movement (laxative) can cause diarrhea and lead to hypokalemia.  Chronic kidney disease (CKD).  Medicines that help the body get rid of excess fluid (diuretics).  Eating disorders, such as bulimia.  Low magnesium levels in the body.  Sweating a lot.  What are the signs or symptoms? Symptoms of this condition include:  Weakness.  Constipation.  Fatigue.  Muscle cramps.  Mental confusion.  Skipped heartbeats or irregular heartbeat (palpitations).  Tingling or numbness.  How is this diagnosed? This condition is diagnosed with a blood test. How is this treated? Hypokalemia can be treated by taking potassium supplements by mouth or adjusting the medicines that you take. Treatment may also include eating more foods that contain a lot of potassium. If your potassium level is very low, you may need to get potassium through an IV tube in one of your veins and be monitored in the  hospital. Follow these instructions at home:  Take over-the-counter and prescription medicines only as told by your health care provider. This includes vitamins and supplements.  Eat a healthy diet. A healthy diet includes fresh fruits and vegetables, whole grains, healthy fats, and lean proteins.  If instructed, eat more foods that contain a lot of potassium, such as: ? Nuts, such as peanuts and pistachios. ? Seeds, such as sunflower seeds and pumpkin seeds. ? Peas, lentils, and lima beans. ? Whole grain and bran cereals and breads. ? Fresh fruits and vegetables, such as apricots, avocado, bananas, cantaloupe, kiwi, oranges, tomatoes, asparagus, and potatoes. ? Orange juice. ? Tomato juice. ? Red meats. ? Yogurt.  Keep all follow-up visits as told by your health care provider. This is important. Contact a health care provider if:  You have weakness that gets worse.  You feel your heart pounding or racing.  You vomit.  You have diarrhea.  You have diabetes (diabetes mellitus) and you have trouble keeping your blood sugar (glucose) in your   target range. Get help right away if:  You have chest pain.  You have shortness of breath.  You have vomiting or diarrhea that lasts for more than 2 days.  You faint. This information is not intended to replace advice given to you by your health care provider. Make sure you discuss any questions you have with your health care provider. Document Released: 08/31/2005 Document Revised: 04/18/2016 Document Reviewed: 04/18/2016 Elsevier Interactive Patient Education  2018 Elsevier Inc.   

## 2018-06-10 NOTE — ED Notes (Signed)
Pt denies SI/HI 

## 2018-06-10 NOTE — Assessment & Plan Note (Signed)
She has difficulties tolerating treatment She has progressive pancytopenia, loss of appetite and weight loss I will hold treatment this week and reschedule We will continue aggressive supportive care.

## 2018-06-10 NOTE — ED Notes (Signed)
ED Provider at bedside. 

## 2018-06-11 ENCOUNTER — Inpatient Hospital Stay: Payer: Medicaid Other

## 2018-06-11 DIAGNOSIS — F321 Major depressive disorder, single episode, moderate: Secondary | ICD-10-CM

## 2018-06-11 MED ORDER — QUETIAPINE FUMARATE 25 MG PO TABS: 25.0000 mg | ORAL_TABLET | Freq: Every day | ORAL | Status: DC

## 2018-06-11 MED ORDER — SODIUM CHLORIDE 0.9 % IV BOLUS
1000.0000 mL | Freq: Once | INTRAVENOUS | Status: AC
  Administered 2018-06-11: 1000 mL via INTRAVENOUS

## 2018-06-11 MED ORDER — FAMOTIDINE IN NACL 20-0.9 MG/50ML-% IV SOLN
INTRAVENOUS | Status: AC
  Filled 2018-06-11: qty 50

## 2018-06-11 NOTE — ED Notes (Signed)
CAREGIVER PRESENT STATES PT TO HAVE INFUSION TODAY AT [facility]. CARE GIVER COULD NOT VERIFY TYPE OF INFUSION. THIS WRITER UNSURE OF INFUSION. CALLED [facility] TO VERIFY ORDERS. [facility] RN GIVEN INFORMATION AND INFORMED TO CALL EDP B [facility]OOK WITH ORDERS IF APPLICABLE TO THE EMERGENCY DEPARTMENT OTHERWISE TO GIVE INSTRUCTIONS FOR PLAN OF CARE. EDP [facility]OOK UPDATED OF PT'S CURRENT STATUS AND AWAITING CALL. CHARGE SUSAN C RN UPDATED AND DISCHARGED CANCELLED AT THIS TIME

## 2018-06-11 NOTE — ED Notes (Signed)
ED Provider at bedside. EDP COOK

## 2018-06-11 NOTE — ED Notes (Signed)
ED Provider at bedside. COOK. OK FOR DISCHARGE

## 2018-06-11 NOTE — ED Notes (Signed)
TTS Provider at bedside. 

## 2018-06-11 NOTE — Consult Note (Signed)
[facility] Psych ED Discharge  06/11/2018 10:41 AM Erin Kent  MRN:  [id number] Principal Problem: Major depressive disorder, single episode, moderate ([place]) Discharge Diagnoses:  Patient Active Problem List   Diagnosis Date Noted  . Acquired pancytopenia ([place]) [B01.751] 06/10/2018  . Major depressive disorder, single episode, moderate ([place]) [F32.1] 06/04/2018  . Self-injurious behavior [F48.9]   . Diarrhea [R19.7] 05/30/2018  . Acute hypokalemia [E87.6] 05/26/2018  . Other constipation [K59.09] 05/19/2018  . Iron deficiency anemia [D50.9] 05/05/2018  . Cachexia ([place]) [R64] 05/05/2018  . Vaginal tumor [D49.59]   . Malignant neoplasm of cervix ([place]) [C53.9] 04/23/2018  . OSTEOPOROSIS [M81.0] 10/10/2008  . HEMOCCULT POSITIVE STOOL [K92.1] 10/02/2008  . KYPHOSIS [M40.00] 10/02/2008  . DENTAL CARIES [K02.9] 05/24/2008  . TRICHOMONAL VULVOVAGINITIS [A59.01] 04/15/2007  . OVARIAN CYST, LEFT [N83.209] 10/14/2004    Subjective: Pt was seen and chart reviewed with treatment team and Dr Dwyane Dee. Pt denies suicidal/homicidal ideation, denies auditory/visual hallucinations and does not appear to be responding to internal stimuli. Pt stated she was doing her IV therapy at the cancer center and was watching something on TV that had a snake on it and she got scared. She is calm and cooperative, her mood is euthymic and her thought processes are in tact. Her UDS and BAL are negative, her remaining blood work shows a decreased WBC of 2.8 which correlates to her chemotherapy treatments.   She has been having some depression for the past week or two and is ruminating about her cervical cancer diagnosis. She has daily IV fluid infusions due to her constant dehydration from her chemo treatments and she has lost weight in relation to her cancer diagnosis. She was seen in the [facility] on 9-20 through 9-22 and started on Zoloft for her depression. She lives with a 24/7 caregiver. She is able to contract for safety on  discharge. She will be referred to [facility] for medication management and therapy. She will follow up with her Cancer doctors for her ongoing chronic illness of cervical cancer. She is psychiatrically clear for discharge home.   Total Time spent with patient: 45 minutes  Past Psychiatric History: As above  Past Medical History:  Past Medical History:  Diagnosis Date  . Cervical cancer ([place])   . Iron deficiency anemia    Past Surgical History:  Procedure Laterality Date  . CYSTOSCOPY N/A 04/28/2018   Procedure: CYSTOSCOPY;  Surgeon: Isabel Caprice, MD;  Location: [facility];  Service: Gynecology;  Laterality: N/A;  . EUA/ PAPSMEAR/  CERVICAL MASS BIOPSY  04-12-2018   dr peggy constant @[facility]   . IR IMAGING GUIDED PORT INSERTION  05/12/2018  . WISDOM TOOTH EXTRACTION     Family History:  Family History  Problem Relation Age of Onset  . Hypertension Mother    Family Psychiatric  History: Unable to be determined, Pt did not have this information Social History:  Social History   Substance and Sexual Activity  Alcohol Use No    Social History   Substance and Sexual Activity  Drug Use No   Social History   Socioeconomic History  . Marital status: Single    Spouse name: Not on file  . Number of children: 0  . Years of education: Not on file  . Highest education level: Not on file  Occupational History  . Not on file  Social Needs  . Financial resource strain: Not on file  . Food insecurity:    Worry: Not on file  Inability: Not on file  . Transportation needs:    Medical: Not on file    Non-medical: Not on file  Tobacco Use  . Smoking status: Never Smoker  . Smokeless tobacco: Never Used  Substance and Sexual Activity  . Alcohol use: No  . Drug use: No  . Sexual activity: Not Currently    Birth control/protection: None  Lifestyle  . Physical activity:    Days per week: Not on file    Minutes per session: Not on file  . Stress: Not on file   Relationships  . Social connections:    Talks on phone: Not on file    Gets together: Not on file    Attends religious service: Not on file    Active member of club or organization: Not on file    Attends meetings of clubs or organizations: Not on file    Relationship status: Not on file  Other Topics Concern  . Not on file  Social History Narrative  . Not on file    Has this patient used any form of tobacco in the last 30 days? (Cigarettes, Smokeless Tobacco, Cigars, and/or Pipes) Prescription not provided because: Pt does not smoke  Current Medications: Current Facility-Administered Medications  Medication Dose Route Frequency Provider Last Rate Last Dose  . haloperidol lactate (HALDOL) injection 5 mg  5 mg Intramuscular Q6H PRN Mortis, Gabrielle I, PA-C       Or  . haloperidol (HALDOL) tablet 5 mg  5 mg Oral Q6H PRN Mortis, Gabrielle I, PA-C      . LORazepam (ATIVAN) injection 1 mg  1 mg Intramuscular Q6H PRN Mortis, Alvie Heidelberg I, PA-C       Or  . LORazepam (ATIVAN) tablet 1 mg  1 mg Oral Q6H PRN Mortis, Jonelle Sports, PA-C       Current Outpatient Medications  Medication Sig Dispense Refill  . dexamethasone (DECADRON) 4 MG tablet Take 1 tablet (4 mg total) by mouth daily. 30 tablet 0  . hydrOXYzine (ATARAX/VISTARIL) 25 MG tablet Take 25 mg by mouth 3 (three) times daily as needed for anxiety.    . lidocaine-prilocaine (EMLA) cream Apply to affected area once (Patient taking differently: Apply 1 application topically as needed (for port acces). ) 30 g 3  . sertraline (ZOLOFT) 25 MG tablet Take 1 tablet (25 mg total) by mouth daily. 30 tablet 0  . traZODone (DESYREL) 50 MG tablet Take 1 tablet (50 mg total) by mouth at bedtime. 30 tablet 0  . ondansetron (ZOFRAN) 8 MG tablet Take 1 tablet (8 mg total) by mouth every 8 (eight) hours as needed. Start on the third day after chemotherapy. (Patient not taking: Reported on 06/10/2018) 30 tablet 1  . oxyCODONE-acetaminophen  (PERCOCET/ROXICET) 5-325 MG tablet Take 1-2 tablets by mouth every 6 (six) hours as needed. (Patient not taking: Reported on 05/04/2018) 20 tablet 0  . prochlorperazine (COMPAZINE) 10 MG tablet Take 1 tablet (10 mg total) by mouth every 6 (six) hours as needed (Nausea or vomiting). (Patient not taking: Reported on 06/03/2018) 30 tablet 1   Facility-Administered Medications Ordered in Other Encounters  Medication Dose Route Frequency Provider Last Rate Last Dose  . sodium chloride 0.9 % 1,000 mL with potassium chloride 20 mEq infusion   Intravenous Continuous [name], MD   Stopped at 06/09/18 1406    Musculoskeletal: Strength & Muscle Tone: within normal limits Gait & Station: normal Patient leans: N/A  Psychiatric Specialty Exam: Physical Exam  Constitutional: She  is oriented to person, place, and time. She appears well-developed.  Respiratory: Effort normal.  Neurological: She is alert and oriented to person, place, and time.  Psychiatric: She expresses impulsivity. She exhibits a depressed mood.    Review of Systems  Psychiatric/Behavioral: Positive for depression.  All other systems reviewed and are negative.   Blood pressure 118/69, pulse 68, temperature 98.1 F (36.7 C), temperature source Oral, resp. rate 13, height 5\' 1"  (1.549 m), weight 37.6 kg, SpO2 100 %.Body mass index is 15.68 kg/m.  General Appearance: Casual  Eye Contact:  Good  Speech:  Clear and Coherent and Normal Rate  Volume:  Normal  Mood:  Euthymic  Affect:  Congruent  Thought Process:  Coherent, Goal Directed and Linear  Orientation:  Full (Time, Place, and Person)  Thought Content:  Logical  Suicidal Thoughts:  No  Homicidal Thoughts:  No  Memory:  Immediate;   Good Recent;   Fair Remote;   Fair  Judgement:  Fair  Insight:  Fair  Psychomotor Activity:  Normal  Concentration:  Concentration: Good and Attention Span: Good  Recall:  Good  Fund of Knowledge:  Good  Language:  Good  Akathisia:  No   Handed:  Right  AIMS (if indicated):     Assets:  Agricultural consultant Housing Social Support  ADL's:  Intact  Cognition:  WNL  Sleep:        Demographic Factors:  Caucasian, Low socioeconomic status and Unemployed  Loss Factors: Decline in physical health  Historical Factors: Impulsivity  Risk Reduction Factors:   Living with another person, especially a relative  Continued Clinical Symptoms:  Depression:   Impulsivity Medical Diagnoses and Treatments/Surgeries  Cognitive Features That Contribute To Risk:  Closed-mindedness    Suicide Risk:  Minimal: No identifiable suicidal ideation.  Patients presenting with no risk factors but with morbid ruminations; may be classified as minimal risk based on the severity of the depressive symptoms    Disposition: Treatment recommendations:   Take all medications as prescribed by your outpatient providers.    You are followed at the Cancer center for your cervical cancer, please contact them to let them know you were evaluated in the emergency room and you are psychiatrically clear.   Follow up with [facility] for management of the Zoloft you were started on in the emergency room last week, 05-16-2018. Follow up at [facility] for therapy related to your cancer diagnosis and your depression.  Keep all follow-up appointments as scheduled with your PCP cancer care doctors.   Do not consume alcohol or use illegal drugs while on prescription medications.  Report any adverse effects from your medications to your primary care or cancer care  provider promptly.   In the event of recurrent symptoms or worsening symptoms, call 911, a crisis hotline, or go to the nearest emergency department for evaluation.   Ethelene Hal, NP 06/11/2018, 10:41 AM

## 2018-06-11 NOTE — Discharge Instructions (Signed)
For your mental health needs, please follow up for therapy at:   [facility] [address] [place], [place] [address] [phone]

## 2018-06-13 ENCOUNTER — Ambulatory Visit
Admission: RE | Admit: 2018-06-13 | Discharge: 2018-06-13 | Disposition: A | Discharge status: Home / Self Care | Payer: Medicaid Other | Source: Ambulatory Visit | Attending: Radiation Oncology | Admitting: Radiation Oncology

## 2018-06-13 ENCOUNTER — Other Ambulatory Visit: Payer: Self-pay | Admitting: Hematology and Oncology

## 2018-06-13 ENCOUNTER — Inpatient Hospital Stay: Payer: Medicaid Other

## 2018-06-13 ENCOUNTER — Encounter: Payer: Self-pay | Admitting: Oncology

## 2018-06-13 VITALS — BP 129/69 | HR 70 | Temp 98.0°F | Resp 15

## 2018-06-13 DIAGNOSIS — Z5111 Encounter for antineoplastic chemotherapy: Secondary | ICD-10-CM | POA: Diagnosis not present

## 2018-06-13 DIAGNOSIS — Z510 Encounter for antineoplastic radiation therapy: Secondary | ICD-10-CM | POA: Diagnosis not present

## 2018-06-13 DIAGNOSIS — C539 Malignant neoplasm of cervix uteri, unspecified: Secondary | ICD-10-CM

## 2018-06-13 MED ORDER — HEPARIN SOD (PORK) LOCK FLUSH 100 UNIT/ML IV SOLN
500.0000 [IU] | Freq: Once | INTRAVENOUS | Status: AC | PRN
  Administered 2018-06-13: 500 [IU]
  Filled 2018-06-13: qty 5

## 2018-06-13 MED ORDER — SODIUM CHLORIDE 0.9% FLUSH
10.0000 mL | Freq: Once | INTRAVENOUS | Status: AC | PRN
  Administered 2018-06-13: 10 mL
  Filled 2018-06-13: qty 10

## 2018-06-13 MED ORDER — SODIUM CHLORIDE 0.9 % IV SOLN
Freq: Once | INTRAVENOUS | Status: AC
  Administered 2018-06-13: 14:00:00 via INTRAVENOUS
  Filled 2018-06-13: qty 250

## 2018-06-13 NOTE — Patient Instructions (Signed)
Dehydration, Adult Dehydration is when there is not enough fluid or water in your body. This happens when you lose more fluids than you take in. Dehydration can range from mild to very bad. It should be treated right away to keep it from getting very bad. Symptoms of mild dehydration may include:  Thirst.  Dry lips.  Slightly dry mouth.  Dry, warm skin.  Dizziness. Symptoms of moderate dehydration may include:  Very dry mouth.  Muscle cramps.  Dark pee (urine). Pee may be the color of tea.  Your body making less pee.  Your eyes making fewer tears.  Heartbeat that is uneven or faster than normal (palpitations).  Headache.  Light-headedness, especially when you stand up from sitting.  Fainting (syncope). Symptoms of very bad dehydration may include:  Changes in skin, such as: ? Cold and clammy skin. ? Blotchy (mottled) or pale skin. ? Skin that does not quickly return to normal after being lightly pinched and let go (poor skin turgor).  Changes in body fluids, such as: ? Feeling very thirsty. ? Your eyes making fewer tears. ? Not sweating when body temperature is high, such as in hot weather. ? Your body making very little pee.  Changes in vital signs, such as: ? Weak pulse. ? Pulse that is more than 100 beats a minute when you are sitting still. ? Fast breathing. ? Low blood pressure.  Other changes, such as: ? Sunken eyes. ? Cold hands and feet. ? Confusion. ? Lack of energy (lethargy). ? Trouble waking up from sleep. ? Short-term weight loss. ? Unconsciousness. Follow these instructions at home:  If told by your doctor, drink an ORS: ? Make an ORS by using instructions on the package. ? Start by drinking small amounts, about  cup (120 mL) every 5-10 minutes. ? Slowly drink more until you have had the amount that your doctor said to have.  Drink enough clear fluid to keep your pee clear or pale yellow. If you were told to drink an ORS, finish the ORS  first, then start slowly drinking clear fluids. Drink fluids such as: ? Water. Do not drink only water by itself. Doing that can make the salt (sodium) level in your body get too low (hyponatremia). ? Ice chips. ? Fruit juice that you have added water to (diluted). ? Low-calorie sports drinks.  Avoid: ? Alcohol. ? Drinks that have a lot of sugar. These include high-calorie sports drinks, fruit juice that does not have water added, and soda. ? Caffeine. ? Foods that are greasy or have a lot of fat or sugar.  Take over-the-counter and prescription medicines only as told by your doctor.  Do not take salt tablets. Doing that can make the salt level in your body get too high (hypernatremia).  Eat foods that have minerals (electrolytes). Examples include bananas, oranges, potatoes, tomatoes, and spinach.  Keep all follow-up visits as told by your doctor. This is important. Contact a doctor if:  You have belly (abdominal) pain that: ? Gets worse. ? Stays in one area (localizes).  You have a rash.  You have a stiff neck.  You get angry or annoyed more easily than normal (irritability).  You are more sleepy than normal.  You have a harder time waking up than normal.  You feel: ? Weak. ? Dizzy. ? Very thirsty.  You have peed (urinated) only a small amount of very dark pee during 6-8 hours. Get help right away if:  You have symptoms of   very bad dehydration.  You cannot drink fluids without throwing up (vomiting).  Your symptoms get worse with treatment.  You have a fever.  You have a very bad headache.  You are throwing up or having watery poop (diarrhea) and it: ? Gets worse. ? Does not go away.  You have blood or something green (bile) in your throw-up.  You have blood in your poop (stool). This may cause poop to look black and tarry.  You have not peed in 6-8 hours.  You pass out (faint).  Your heart rate when you are sitting still is more than 100 beats a  minute.  You have trouble breathing. This information is not intended to replace advice given to you by your health care provider. Make sure you discuss any questions you have with your health care provider. Document Released: 06/27/2009 Document Revised: 03/20/2016 Document Reviewed: 10/25/2015 Elsevier Interactive Patient Education  2018 Elsevier Inc.  

## 2018-06-13 NOTE — Progress Notes (Signed)
Talked with Erin Kent and her caregiver, Erin Kent, about her hospitalization this weekend.  Erin Kent is giving Erin Kent her medications and has stopped giving her Zoloft and trazodone.  She thinks they may have been causing the hallucinations and has noticed that Erin Kent is clearer now that she has not taken them for 2 days.  She is also eating better and slept last night.

## 2018-06-14 ENCOUNTER — Inpatient Hospital Stay: Payer: Medicaid Other | Attending: Obstetrics

## 2018-06-14 ENCOUNTER — Ambulatory Visit
Admission: RE | Admit: 2018-06-14 | Discharge: 2018-06-14 | Disposition: A | Discharge status: Home / Self Care | Payer: Medicaid Other | Source: Ambulatory Visit | Attending: Radiation Oncology | Admitting: Radiation Oncology

## 2018-06-14 DIAGNOSIS — Z510 Encounter for antineoplastic radiation therapy: Secondary | ICD-10-CM | POA: Insufficient documentation

## 2018-06-14 DIAGNOSIS — R64 Cachexia: Secondary | ICD-10-CM | POA: Diagnosis not present

## 2018-06-14 DIAGNOSIS — F321 Major depressive disorder, single episode, moderate: Secondary | ICD-10-CM | POA: Diagnosis not present

## 2018-06-14 DIAGNOSIS — R634 Abnormal weight loss: Secondary | ICD-10-CM | POA: Insufficient documentation

## 2018-06-14 DIAGNOSIS — C539 Malignant neoplasm of cervix uteri, unspecified: Secondary | ICD-10-CM | POA: Insufficient documentation

## 2018-06-14 DIAGNOSIS — Z5111 Encounter for antineoplastic chemotherapy: Secondary | ICD-10-CM | POA: Diagnosis not present

## 2018-06-14 DIAGNOSIS — Z79899 Other long term (current) drug therapy: Secondary | ICD-10-CM | POA: Insufficient documentation

## 2018-06-14 DIAGNOSIS — D61818 Other pancytopenia: Secondary | ICD-10-CM | POA: Insufficient documentation

## 2018-06-14 DIAGNOSIS — E876 Hypokalemia: Secondary | ICD-10-CM | POA: Diagnosis not present

## 2018-06-14 MED ORDER — SODIUM CHLORIDE 0.9 % IV SOLN
Freq: Once | INTRAVENOUS | Status: AC
  Administered 2018-06-14: 14:00:00 via INTRAVENOUS
  Filled 2018-06-14: qty 250

## 2018-06-14 MED ORDER — HEPARIN SOD (PORK) LOCK FLUSH 100 UNIT/ML IV SOLN
500.0000 [IU] | Freq: Once | INTRAVENOUS | Status: AC | PRN
  Administered 2018-06-14: 500 [IU]
  Filled 2018-06-14: qty 5

## 2018-06-14 MED ORDER — SODIUM CHLORIDE 0.9% FLUSH
10.0000 mL | Freq: Once | INTRAVENOUS | Status: AC | PRN
  Administered 2018-06-14: 10 mL
  Filled 2018-06-14: qty 10

## 2018-06-14 NOTE — Patient Instructions (Signed)
Dehydration, Adult Dehydration is when there is not enough fluid or water in your body. This happens when you lose more fluids than you take in. Dehydration can range from mild to very bad. It should be treated right away to keep it from getting very bad. Symptoms of mild dehydration may include:  Thirst.  Dry lips.  Slightly dry mouth.  Dry, warm skin.  Dizziness. Symptoms of moderate dehydration may include:  Very dry mouth.  Muscle cramps.  Dark pee (urine). Pee may be the color of tea.  Your body making less pee.  Your eyes making fewer tears.  Heartbeat that is uneven or faster than normal (palpitations).  Headache.  Light-headedness, especially when you stand up from sitting.  Fainting (syncope). Symptoms of very bad dehydration may include:  Changes in skin, such as: ? Cold and clammy skin. ? Blotchy (mottled) or pale skin. ? Skin that does not quickly return to normal after being lightly pinched and let go (poor skin turgor).  Changes in body fluids, such as: ? Feeling very thirsty. ? Your eyes making fewer tears. ? Not sweating when body temperature is high, such as in hot weather. ? Your body making very little pee.  Changes in vital signs, such as: ? Weak pulse. ? Pulse that is more than 100 beats a minute when you are sitting still. ? Fast breathing. ? Low blood pressure.  Other changes, such as: ? Sunken eyes. ? Cold hands and feet. ? Confusion. ? Lack of energy (lethargy). ? Trouble waking up from sleep. ? Short-term weight loss. ? Unconsciousness. Follow these instructions at home:  If told by your doctor, drink an ORS: ? Make an ORS by using instructions on the package. ? Start by drinking small amounts, about  cup (120 mL) every 5-10 minutes. ? Slowly drink more until you have had the amount that your doctor said to have.  Drink enough clear fluid to keep your pee clear or pale yellow. If you were told to drink an ORS, finish the ORS  first, then start slowly drinking clear fluids. Drink fluids such as: ? Water. Do not drink only water by itself. Doing that can make the salt (sodium) level in your body get too low (hyponatremia). ? Ice chips. ? Fruit juice that you have added water to (diluted). ? Low-calorie sports drinks.  Avoid: ? Alcohol. ? Drinks that have a lot of sugar. These include high-calorie sports drinks, fruit juice that does not have water added, and soda. ? Caffeine. ? Foods that are greasy or have a lot of fat or sugar.  Take over-the-counter and prescription medicines only as told by your doctor.  Do not take salt tablets. Doing that can make the salt level in your body get too high (hypernatremia).  Eat foods that have minerals (electrolytes). Examples include bananas, oranges, potatoes, tomatoes, and spinach.  Keep all follow-up visits as told by your doctor. This is important. Contact a doctor if:  You have belly (abdominal) pain that: ? Gets worse. ? Stays in one area (localizes).  You have a rash.  You have a stiff neck.  You get angry or annoyed more easily than normal (irritability).  You are more sleepy than normal.  You have a harder time waking up than normal.  You feel: ? Weak. ? Dizzy. ? Very thirsty.  You have peed (urinated) only a small amount of very dark pee during 6-8 hours. Get help right away if:  You have symptoms of   very bad dehydration.  You cannot drink fluids without throwing up (vomiting).  Your symptoms get worse with treatment.  You have a fever.  You have a very bad headache.  You are throwing up or having watery poop (diarrhea) and it: ? Gets worse. ? Does not go away.  You have blood or something green (bile) in your throw-up.  You have blood in your poop (stool). This may cause poop to look black and tarry.  You have not peed in 6-8 hours.  You pass out (faint).  Your heart rate when you are sitting still is more than 100 beats a  minute.  You have trouble breathing. This information is not intended to replace advice given to you by your health care provider. Make sure you discuss any questions you have with your health care provider. Document Released: 06/27/2009 Document Revised: 03/20/2016 Document Reviewed: 10/25/2015 Elsevier Interactive Patient Education  2018 Elsevier Inc.  

## 2018-06-15 ENCOUNTER — Other Ambulatory Visit: Payer: Medicaid Other

## 2018-06-15 ENCOUNTER — Other Ambulatory Visit: Payer: Self-pay | Admitting: Hematology and Oncology

## 2018-06-15 ENCOUNTER — Inpatient Hospital Stay: Payer: Medicaid Other

## 2018-06-15 ENCOUNTER — Ambulatory Visit
Admission: RE | Admit: 2018-06-15 | Discharge: 2018-06-15 | Disposition: A | Discharge status: Home / Self Care | Payer: Medicaid Other | Source: Ambulatory Visit | Attending: Radiation Oncology | Admitting: Radiation Oncology

## 2018-06-15 ENCOUNTER — Encounter: Payer: Self-pay | Admitting: General Practice

## 2018-06-15 VITALS — BP 140/75 | HR 70 | Temp 98.0°F | Resp 16

## 2018-06-15 DIAGNOSIS — D500 Iron deficiency anemia secondary to blood loss (chronic): Secondary | ICD-10-CM

## 2018-06-15 DIAGNOSIS — Z510 Encounter for antineoplastic radiation therapy: Secondary | ICD-10-CM | POA: Diagnosis not present

## 2018-06-15 DIAGNOSIS — C539 Malignant neoplasm of cervix uteri, unspecified: Secondary | ICD-10-CM

## 2018-06-15 DIAGNOSIS — C801 Malignant (primary) neoplasm, unspecified: Secondary | ICD-10-CM

## 2018-06-15 DIAGNOSIS — Z5111 Encounter for antineoplastic chemotherapy: Secondary | ICD-10-CM | POA: Diagnosis not present

## 2018-06-15 LAB — COMPREHENSIVE METABOLIC PANEL
ALBUMIN: 3.3 g/dL — AB (ref 3.5–5.0)
ALT: 11 U/L (ref 0–44)
AST: 12 U/L — ABNORMAL LOW (ref 15–41)
Alkaline Phosphatase: 60 U/L (ref 38–126)
Anion gap: 5 (ref 5–15)
BUN: 9 mg/dL (ref 8–23)
CO2: 31 mmol/L (ref 22–32)
Calcium: 8.7 mg/dL — ABNORMAL LOW (ref 8.9–10.3)
Chloride: 103 mmol/L (ref 98–111)
Creatinine, Ser: 0.6 mg/dL (ref 0.44–1.00)
GFR calc Af Amer: >60 mL/min (ref >60)
GFR calc non Af Amer: >60 mL/min (ref >60)
GLUCOSE: 112 mg/dL — AB (ref 70–99)
POTASSIUM: 2.9 mmol/L — AB (ref 3.5–5.1)
SODIUM: 139 mmol/L (ref 135–145)
Total Bilirubin: <0.2 mg/dL — ABNORMAL LOW (ref 0.3–1.2)
Total Protein: 6.1 g/dL — ABNORMAL LOW (ref 6.5–8.1)

## 2018-06-15 LAB — CBC WITH DIFFERENTIAL/PLATELET
BASOS PCT: 0 %
Basophils Absolute: 0 10*3/uL (ref 0.0–0.1)
EOS ABS: 0.1 10*3/uL (ref 0.0–0.5)
EOS PCT: 2 %
HCT: 30.8 % — ABNORMAL LOW (ref 34.8–46.6)
HEMOGLOBIN: 9.8 g/dL — AB (ref 11.6–15.9)
Lymphocytes Relative: 11 %
Lymphs Abs: 0.3 10*3/uL — ABNORMAL LOW (ref 0.9–3.3)
MCH: 26.5 pg (ref 25.1–34.0)
MCHC: 31.8 g/dL (ref 31.5–36.0)
MCV: 83.2 fL (ref 79.5–101.0)
MONO ABS: 0.4 10*3/uL (ref 0.1–0.9)
MONOS PCT: 15 %
NEUTROS PCT: 72 %
Neutro Abs: 2.1 10*3/uL (ref 1.5–6.5)
PLATELETS: 114 10*3/uL — AB (ref 145–400)
RBC: 3.7 MIL/uL (ref 3.70–5.45)
RDW: 23 % — AB (ref 11.2–14.5)
WBC: 2.9 10*3/uL — ABNORMAL LOW (ref 3.9–10.3)

## 2018-06-15 LAB — IRON AND TIBC
Iron: 63 ug/dL (ref 41–142)
SATURATION RATIOS: 35 % (ref 21–57)
TIBC: 180 ug/dL — ABNORMAL LOW (ref 236–444)
UIBC: 117 ug/dL

## 2018-06-15 LAB — FERRITIN: FERRITIN: 834 ng/mL — AB (ref 11–307)

## 2018-06-15 LAB — MAGNESIUM: Magnesium: 1.8 mg/dL (ref 1.7–2.4)

## 2018-06-15 MED ORDER — HEPARIN SOD (PORK) LOCK FLUSH 100 UNIT/ML IV SOLN
500.0000 [IU] | Freq: Once | INTRAVENOUS | Status: AC | PRN
  Administered 2018-06-15: 500 [IU]
  Filled 2018-06-15: qty 5

## 2018-06-15 MED ORDER — SODIUM CHLORIDE 0.9 % IV SOLN
Freq: Once | INTRAVENOUS | Status: AC
  Administered 2018-06-15: 13:00:00 via INTRAVENOUS
  Filled 2018-06-15: qty 1000

## 2018-06-15 MED ORDER — SODIUM CHLORIDE 0.9% FLUSH
10.0000 mL | Freq: Once | INTRAVENOUS | Status: AC
  Administered 2018-06-15: 10 mL
  Filled 2018-06-15: qty 10

## 2018-06-15 MED ORDER — SODIUM CHLORIDE 0.9% FLUSH
10.0000 mL | Freq: Once | INTRAVENOUS | Status: AC | PRN
  Administered 2018-06-15: 10 mL
  Filled 2018-06-15: qty 10

## 2018-06-15 NOTE — Patient Instructions (Signed)
Dehydration, Adult Dehydration is when there is not enough fluid or water in your body. This happens when you lose more fluids than you take in. Dehydration can range from mild to very bad. It should be treated right away to keep it from getting very bad. Symptoms of mild dehydration may include:  Thirst.  Dry lips.  Slightly dry mouth.  Dry, warm skin.  Dizziness. Symptoms of moderate dehydration may include:  Very dry mouth.  Muscle cramps.  Dark pee (urine). Pee may be the color of tea.  Your body making less pee.  Your eyes making fewer tears.  Heartbeat that is uneven or faster than normal (palpitations).  Headache.  Light-headedness, especially when you stand up from sitting.  Fainting (syncope). Symptoms of very bad dehydration may include:  Changes in skin, such as: ? Cold and clammy skin. ? Blotchy (mottled) or pale skin. ? Skin that does not quickly return to normal after being lightly pinched and let go (poor skin turgor).  Changes in body fluids, such as: ? Feeling very thirsty. ? Your eyes making fewer tears. ? Not sweating when body temperature is high, such as in hot weather. ? Your body making very little pee.  Changes in vital signs, such as: ? Weak pulse. ? Pulse that is more than 100 beats a minute when you are sitting still. ? Fast breathing. ? Low blood pressure.  Other changes, such as: ? Sunken eyes. ? Cold hands and feet. ? Confusion. ? Lack of energy (lethargy). ? Trouble waking up from sleep. ? Short-term weight loss. ? Unconsciousness. Follow these instructions at home:  If told by your doctor, drink an ORS: ? Make an ORS by using instructions on the package. ? Start by drinking small amounts, about  cup (120 mL) every 5-10 minutes. ? Slowly drink more until you have had the amount that your doctor said to have.  Drink enough clear fluid to keep your pee clear or pale yellow. If you were told to drink an ORS, finish the ORS  first, then start slowly drinking clear fluids. Drink fluids such as: ? Water. Do not drink only water by itself. Doing that can make the salt (sodium) level in your body get too low (hyponatremia). ? Ice chips. ? Fruit juice that you have added water to (diluted). ? Low-calorie sports drinks.  Avoid: ? Alcohol. ? Drinks that have a lot of sugar. These include high-calorie sports drinks, fruit juice that does not have water added, and soda. ? Caffeine. ? Foods that are greasy or have a lot of fat or sugar.  Take over-the-counter and prescription medicines only as told by your doctor.  Do not take salt tablets. Doing that can make the salt level in your body get too high (hypernatremia).  Eat foods that have minerals (electrolytes). Examples include bananas, oranges, potatoes, tomatoes, and spinach.  Keep all follow-up visits as told by your doctor. This is important. Contact a doctor if:  You have belly (abdominal) pain that: ? Gets worse. ? Stays in one area (localizes).  You have a rash.  You have a stiff neck.  You get angry or annoyed more easily than normal (irritability).  You are more sleepy than normal.  You have a harder time waking up than normal.  You feel: ? Weak. ? Dizzy. ? Very thirsty.  You have peed (urinated) only a small amount of very dark pee during 6-8 hours. Get help right away if:  You have symptoms of   very bad dehydration.  You cannot drink fluids without throwing up (vomiting).  Your symptoms get worse with treatment.  You have a fever.  You have a very bad headache.  You are throwing up or having watery poop (diarrhea) and it: ? Gets worse. ? Does not go away.  You have blood or something green (bile) in your throw-up.  You have blood in your poop (stool). This may cause poop to look black and tarry.  You have not peed in 6-8 hours.  You pass out (faint).  Your heart rate when you are sitting still is more than 100 beats a  minute.  You have trouble breathing. This information is not intended to replace advice given to you by your health care provider. Make sure you discuss any questions you have with your health care provider. Document Released: 06/27/2009 Document Revised: 03/20/2016 Document Reviewed: 10/25/2015 Elsevier Interactive Patient Education  2018 Elsevier Inc.  

## 2018-06-15 NOTE — Progress Notes (Signed)
[facility] Spiritual Care Note  Followed up with Mel Almond and caregiver Fannie in infusion. Fannie was feeling grateful and relieved at her new hypothesis that, even with regular IVF, Gracia's recent lethargy and confusion ("She's just been so zombie-like") seem to be associated with dehydration. Fannie plans to discuss this with RN today. For her, this potential clarity is an answer to prayer.  Both Fannie and Trinitee are aware of ongoing chaplain availability. Please page as needed/desired. Thank you.   [place], [place], [facility] Pager [phone] Voicemail [phone]

## 2018-06-16 ENCOUNTER — Telehealth: Payer: Self-pay | Admitting: Hematology and Oncology

## 2018-06-16 ENCOUNTER — Inpatient Hospital Stay: Payer: Medicaid Other

## 2018-06-16 ENCOUNTER — Other Ambulatory Visit: Payer: Self-pay | Admitting: Hematology and Oncology

## 2018-06-16 ENCOUNTER — Inpatient Hospital Stay (HOSPITAL_BASED_OUTPATIENT_CLINIC_OR_DEPARTMENT_OTHER): Payer: Medicaid Other | Admitting: Hematology and Oncology

## 2018-06-16 ENCOUNTER — Encounter: Payer: Self-pay | Admitting: Hematology and Oncology

## 2018-06-16 ENCOUNTER — Ambulatory Visit
Admission: RE | Admit: 2018-06-16 | Discharge: 2018-06-16 | Disposition: A | Discharge status: Home / Self Care | Payer: Medicaid Other | Source: Ambulatory Visit | Attending: Radiation Oncology | Admitting: Radiation Oncology

## 2018-06-16 VITALS — BP 128/59 | HR 72 | Temp 98.8°F | Resp 20

## 2018-06-16 DIAGNOSIS — Z79899 Other long term (current) drug therapy: Secondary | ICD-10-CM

## 2018-06-16 DIAGNOSIS — Z5111 Encounter for antineoplastic chemotherapy: Secondary | ICD-10-CM

## 2018-06-16 DIAGNOSIS — C539 Malignant neoplasm of cervix uteri, unspecified: Secondary | ICD-10-CM

## 2018-06-16 DIAGNOSIS — D61818 Other pancytopenia: Secondary | ICD-10-CM

## 2018-06-16 DIAGNOSIS — D500 Iron deficiency anemia secondary to blood loss (chronic): Secondary | ICD-10-CM

## 2018-06-16 DIAGNOSIS — F329 Major depressive disorder, single episode, unspecified: Secondary | ICD-10-CM

## 2018-06-16 DIAGNOSIS — R634 Abnormal weight loss: Secondary | ICD-10-CM

## 2018-06-16 DIAGNOSIS — Z510 Encounter for antineoplastic radiation therapy: Secondary | ICD-10-CM | POA: Diagnosis not present

## 2018-06-16 DIAGNOSIS — R64 Cachexia: Secondary | ICD-10-CM

## 2018-06-16 DIAGNOSIS — E876 Hypokalemia: Secondary | ICD-10-CM

## 2018-06-16 MED ORDER — SODIUM CHLORIDE 0.9% FLUSH
10.0000 mL | Freq: Once | INTRAVENOUS | Status: DC | PRN
  Filled 2018-06-16: qty 10

## 2018-06-16 MED ORDER — SODIUM CHLORIDE 0.9 % IV SOLN
Freq: Once | INTRAVENOUS | Status: AC
  Administered 2018-06-16: 10:00:00 via INTRAVENOUS
  Filled 2018-06-16: qty 250

## 2018-06-16 MED ORDER — SODIUM CHLORIDE 0.9% FLUSH
10.0000 mL | Freq: Once | INTRAVENOUS | Status: AC
  Administered 2018-06-16: 10 mL
  Filled 2018-06-16: qty 10

## 2018-06-16 MED ORDER — HEPARIN SOD (PORK) LOCK FLUSH 100 UNIT/ML IV SOLN
500.0000 [IU] | Freq: Once | INTRAVENOUS | Status: AC
  Administered 2018-06-16: 500 [IU]
  Filled 2018-06-16: qty 5

## 2018-06-16 MED ORDER — HEPARIN SOD (PORK) LOCK FLUSH 100 UNIT/ML IV SOLN
250.0000 [IU] | Freq: Once | INTRAVENOUS | Status: DC | PRN
  Filled 2018-06-16: qty 5

## 2018-06-16 MED ORDER — SODIUM CHLORIDE 0.9 % IV SOLN
Freq: Once | INTRAVENOUS | Status: AC
  Administered 2018-06-16: 11:00:00 via INTRAVENOUS
  Filled 2018-06-16: qty 1000

## 2018-06-16 NOTE — Assessment & Plan Note (Signed)
She has significant cachexia According to the caregiver, the patient lacked the motivation to eat She has preserved appetite and appears to be eating better I recommend her to take dexamethasone as appetite stimulant and to continue on nutritional supplement as tolerated

## 2018-06-16 NOTE — Assessment & Plan Note (Signed)
She has progressive pancytopenia due to recent weight loss and poor oral intake We will resume treatment this week For now, she does not need transfusion support

## 2018-06-16 NOTE — Assessment & Plan Note (Signed)
She has difficulties tolerating treatment She had recent delay of treatment due to multiple complications including depression, pancytopenia, electrolyte imbalance and progressive weight loss She is improving slowly with aggressive supportive care We will resume chemotherapy treatment today and I plan to reschedule her last dose of treatment to be given next week of the week after She will continue aggressive IV fluid hydration daily

## 2018-06-16 NOTE — Assessment & Plan Note (Signed)
She had recurrent hypokalemia She will receive aggressive IV potassium replacement therapy We discussed potassium rich diet

## 2018-06-16 NOTE — Progress Notes (Signed)
[facility] OFFICE PROGRESS NOTE  Patient Care Team: Patient, [name] as PCP - General (General Practice)  ASSESSMENT & PLAN:  Malignant neoplasm of cervix ([place]) She has difficulties tolerating treatment She had recent delay of treatment due to multiple complications including depression, pancytopenia, electrolyte imbalance and progressive weight loss She is improving slowly with aggressive supportive care We will resume chemotherapy treatment today and I plan to reschedule her last dose of treatment to be given next week of the week after She will continue aggressive IV fluid hydration daily  Acquired pancytopenia ([place]) She has progressive pancytopenia due to recent weight loss and poor oral intake We will resume treatment this week For now, she does not need transfusion support  Iron deficiency anemia Recent repeat iron study showed resolution of iron deficiency.  We will monitor carefully  Acute hypokalemia She had recurrent hypokalemia She will receive aggressive IV potassium replacement therapy We discussed potassium rich diet  Cachexia ([place]) She has significant cachexia According to the caregiver, the patient lacked the motivation to eat She has preserved appetite and appears to be eating better I recommend her to take dexamethasone as appetite stimulant and to continue on nutritional supplement as tolerated   No orders of the defined types were placed in this encounter.   INTERVAL HISTORY: Please see below for problem oriented charting. She is seen in the infusion room with her caregiver She is eating better this week She had recurrent major depression but has stopped all her antidepressant due to side effects of treatment Denies nausea She has intermittent diarrhea, resolved with Imodium She felt a bit stronger since discontinuation of treatment recently No recent infection, fever or chills The patient denies any recent signs or symptoms of  bleeding such as spontaneous epistaxis, hematuria or hematochezia.   SUMMARY OF ONCOLOGIC HISTORY:   Malignant neoplasm of cervix ([place])   03/13/2018 Imaging    US pelvis There are mobile echogenic foci within the endometrial cavity suggestive of gas. This is nonspecific in etiology however may be secondary to endometritis. Correlate for history of recent instrumentation. This obscures visualization of the endometrial tissue and therefore a follow-up pelvic ultrasound in 2-4 weeks is recommended after resolution of the acute symptomatology if symptoms persist to further evaluate the endometrium.     03/13/2018 Initial Diagnosis    She initially presented with PMB    04/12/2018 Pathology Results    Cervix, biopsy, mass - INVASIVE SQUAMOUS CELL CARCINOMA - SEE COMMENT    04/12/2018 Surgery    PREOPERATIVE DIAGNOSIS:  Postmenopausal vaginal bleeding. POSTOPERATIVE DIAGNOSIS: The same PROCEDURE: Exam under anesthesia, pap smear, cervical mass biopsy SURGEON:  Dr. [name]  INDICATIONS: 67 y.o. yo G0P0000 with PMB here for exam under anesthesia.Risks of surgery were discussed with the patient including but not limited to: bleeding which may require transfusion; infection which may require antibiotics; injury to uterus or surrounding organs; need for additional procedures including laparotomy or laparoscopy; and other postoperative/anesthesia complications. Written informed consent was obtained.    FINDINGS:  An 8-week size midline uterus.  No adnexal mass palpable on exam. Normal cervix not visualized. Friable mass seen in involving the vagina at the level of the cervix obliterating the anterior and posterior fornix.  ANESTHESIA:   General INTRAVENOUS FLUIDS:  300 ml of LR ESTIMATED BLOOD LOSS: 20 ml. SPECIMENS: pap smear, cervical mass biopsy COMPLICATIONS:  None immediate.     04/25/2018 PET scan    1. Large cervical mass may  invade into the myometrium in down into the vagina,  maximum SUV 21.3. There is a malignant left external iliac node measuring 1.4 cm in short axis with maximum SUV 14.1. 2. Accentuated activity in the cecum and ascending colon is likely physiologic given that it has no CT correlate. Correlation with the patient's colon cancer screening history is recommended. If screening is not up-to-date, appropriate screening should be considered. 3.  Aortic Atherosclerosis (ICD10-I70.0).     04/28/2018 Surgery    Pre-operative Diagnosis:  At least Stage 2B SCCa Cervical Cancer  Post-operative Diagnosis: At least clinical Stage 3A SCCa Cervical Cancer  Operation:  Exam under anesthesia due to intolerance for vaginal exam in office Cystoscopy due to concern for extension to posterior bladder (from anterior vaginal wall lesion)  Operative Findings:   1. Parametrial involvement on right  2. Right sided subvaginal tumor burden ~3cm. This approximates the right ureteral insertion into the bladder based on my exam during cystoscopy. 3. Extension of disease down upper half of vagina on lateral and posterior walls. 4. Extension of disease down to lower third of anterior vagina (versus skip lesion) thus making her Stage 3A 5. Cystoscopy revealed patent bilateral ureteral orifices and no bladder mucosa invasion or areas of concern. 6. Rectal exam grossly no disease.     05/04/2018 Cancer Staging    Staging form: Cervix Uteri, AJCC 8th Edition - Clinical: Stage IIIB (cT3b, cN1, cM0) - Signed by [name], MD on 05/04/2018    05/12/2018 Procedure    Successful 8 French right internal jugular vein power port placement with its tip at the SVC/RA junction    05/13/2018 -  Chemotherapy    The patient had weekly cisplatin given with concurrent radiation     06/03/2018 Adverse Reaction    She missed her chemo due to severe depression and was sent to the ER for suicidal ideation.  She was seen by psychiatrist.     REVIEW OF SYSTEMS:   Constitutional: Denies  fevers, chills  Eyes: Denies blurriness of vision Ears, nose, mouth, throat, and face: Denies mucositis or sore throat Respiratory: Denies cough, dyspnea or wheezes Cardiovascular: Denies palpitation, chest discomfort or lower extremity swelling Skin: Denies abnormal skin rashes Lymphatics: Denies new lymphadenopathy or easy bruising Behavioral/Psych: Mood is stable, no new changes  All other systems were reviewed with the patient and are negative.  I have reviewed the past medical history, past surgical history, social history and family history with the patient and they are unchanged from previous note.  ALLERGIES:  has No Known Allergies.  MEDICATIONS:  Current Outpatient Medications  Medication Sig Dispense Refill  . dexamethasone (DECADRON) 4 MG tablet Take 1 tablet (4 mg total) by mouth daily. 30 tablet 0  . hydrOXYzine (ATARAX/VISTARIL) 25 MG tablet Take 25 mg by mouth 3 (three) times daily as needed for anxiety.    . lidocaine-prilocaine (EMLA) cream Apply to affected area once (Patient taking differently: Apply 1 application topically as needed (for port acces). ) 30 g 3  . ondansetron (ZOFRAN) 8 MG tablet Take 1 tablet (8 mg total) by mouth every 8 (eight) hours as needed. Start on the third day after chemotherapy. (Patient not taking: Reported on 06/10/2018) 30 tablet 1  . oxyCODONE-acetaminophen (PERCOCET/ROXICET) 5-325 MG tablet Take 1-2 tablets by mouth every 6 (six) hours as needed. (Patient not taking: Reported on 05/04/2018) 20 tablet 0  . prochlorperazine (COMPAZINE) 10 MG tablet Take 1 tablet (10 mg total) by mouth every 6 (six)  hours as needed (Nausea or vomiting). (Patient not taking: Reported on 06/03/2018) 30 tablet 1  . sertraline (ZOLOFT) 25 MG tablet Take 1 tablet (25 mg total) by mouth daily. 30 tablet 0  . traZODone (DESYREL) 50 MG tablet Take 1 tablet (50 mg total) by mouth at bedtime. 30 tablet 0   No current facility-administered medications for this visit.     Facility-Administered Medications Ordered in Other Visits  Medication Dose Route Frequency Provider Last Rate Last Dose  . heparin lock flush 100 unit/mL  500 Units Intracatheter Once Alvy Bimler, Beryl Hornberger, MD      . heparin lock flush 100 unit/mL  250 Units Intracatheter Once PRN Alvy Bimler, Admire Bunnell, MD      . sodium chloride 0.9 % 1,000 mL with potassium chloride 20 mEq infusion   Intravenous Continuous [name], MD   Stopped at 06/09/18 1406  . sodium chloride 0.9 % 1,000 mL with potassium chloride 20 mEq infusion   Intravenous Once [name], MD 500 mL/hr at 06/16/18 1054    . sodium chloride flush (NS) 0.9 % injection 10 mL  10 mL Intracatheter Once Dwanda Tufano, MD      . sodium chloride flush (NS) 0.9 % injection 10 mL  10 mL Intracatheter Once PRN Alvy Bimler, Zade Falkner, MD        PHYSICAL EXAMINATION: ECOG PERFORMANCE STATUS: 1 - Symptomatic but completely ambulatory GENERAL:alert, no distress and comfortable.  She looks thin and cachectic SKIN: skin color, texture, turgor are normal, no rashes or significant lesions EYES: normal, Conjunctiva are pink and non-injected, sclera clear OROPHARYNX:no exudate, no erythema and lips, buccal mucosa, and tongue normal  NECK: supple, thyroid normal size, non-tender, without nodularity LYMPH:  no palpable lymphadenopathy in the cervical, axillary or inguinal LUNGS: clear to auscultation and percussion with normal breathing effort HEART: regular rate & rhythm and no murmurs and no lower extremity edema ABDOMEN:abdomen soft, non-tender and normal bowel sounds Musculoskeletal:no cyanosis of digits and no clubbing  NEURO: alert & oriented x 3 with fluent speech, no focal motor/sensory deficits  LABORATORY DATA:  I have reviewed the data as listed    Component Value Date/Time   NA 139 06/15/2018 1020   K 2.9 (LL) 06/15/2018 1020   CL 103 06/15/2018 1020   CO2 31 06/15/2018 1020   GLUCOSE 112 (H) 06/15/2018 1020   BUN 9 06/15/2018 1020   CREATININE 0.60  06/15/2018 1020   CALCIUM 8.7 (L) 06/15/2018 1020   PROT 6.1 (L) 06/15/2018 1020   ALBUMIN 3.3 (L) 06/15/2018 1020   AST 12 (L) 06/15/2018 1020   ALT 11 06/15/2018 1020   ALKPHOS 60 06/15/2018 1020   BILITOT <0.2 (L) 06/15/2018 1020   GFRNONAA >60 06/15/2018 1020   GFRAA >60 06/15/2018 1020    No results found for: SPEP, UPEP  Lab Results  Component Value Date   WBC 2.9 (L) 06/15/2018   NEUTROABS 2.1 06/15/2018   HGB 9.8 (L) 06/15/2018   HCT 30.8 (L) 06/15/2018   MCV 83.2 06/15/2018   PLT 114 (L) 06/15/2018      Chemistry      Component Value Date/Time   NA 139 06/15/2018 1020   K 2.9 (LL) 06/15/2018 1020   CL 103 06/15/2018 1020   CO2 31 06/15/2018 1020   BUN 9 06/15/2018 1020   CREATININE 0.60 06/15/2018 1020      Component Value Date/Time   CALCIUM 8.7 (L) 06/15/2018 1020   ALKPHOS 60 06/15/2018 1020   AST 12 (L)  06/15/2018 1020   ALT 11 06/15/2018 1020   BILITOT <0.2 (L) 06/15/2018 1020       All questions were answered. The patient knows to call the clinic with any problems, questions or concerns. No barriers to learning was detected.  I spent 20 minutes counseling the patient face to face. The total time spent in the appointment was 30 minutes and more than 50% was on counseling and review of test results  [name], MD 06/16/2018 11:52 AM

## 2018-06-16 NOTE — Telephone Encounter (Signed)
Scheduled appt per 10/3 sch message - left message for patient with appt date and time   

## 2018-06-16 NOTE — Patient Instructions (Signed)
Dehydration, Adult Dehydration is when there is not enough fluid or water in your body. This happens when you lose more fluids than you take in. Dehydration can range from mild to very bad. It should be treated right away to keep it from getting very bad. Symptoms of mild dehydration may include:  Thirst.  Dry lips.  Slightly dry mouth.  Dry, warm skin.  Dizziness. Symptoms of moderate dehydration may include:  Very dry mouth.  Muscle cramps.  Dark pee (urine). Pee may be the color of tea.  Your body making less pee.  Your eyes making fewer tears.  Heartbeat that is uneven or faster than normal (palpitations).  Headache.  Light-headedness, especially when you stand up from sitting.  Fainting (syncope). Symptoms of very bad dehydration may include:  Changes in skin, such as: ? Cold and clammy skin. ? Blotchy (mottled) or pale skin. ? Skin that does not quickly return to normal after being lightly pinched and let go (poor skin turgor).  Changes in body fluids, such as: ? Feeling very thirsty. ? Your eyes making fewer tears. ? Not sweating when body temperature is high, such as in hot weather. ? Your body making very little pee.  Changes in vital signs, such as: ? Weak pulse. ? Pulse that is more than 100 beats a minute when you are sitting still. ? Fast breathing. ? Low blood pressure.  Other changes, such as: ? Sunken eyes. ? Cold hands and feet. ? Confusion. ? Lack of energy (lethargy). ? Trouble waking up from sleep. ? Short-term weight loss. ? Unconsciousness. Follow these instructions at home:  If told by your doctor, drink an ORS: ? Make an ORS by using instructions on the package. ? Start by drinking small amounts, about  cup (120 mL) every 5-10 minutes. ? Slowly drink more until you have had the amount that your doctor said to have.  Drink enough clear fluid to keep your pee clear or pale yellow. If you were told to drink an ORS, finish the ORS  first, then start slowly drinking clear fluids. Drink fluids such as: ? Water. Do not drink only water by itself. Doing that can make the salt (sodium) level in your body get too low (hyponatremia). ? Ice chips. ? Fruit juice that you have added water to (diluted). ? Low-calorie sports drinks.  Avoid: ? Alcohol. ? Drinks that have a lot of sugar. These include high-calorie sports drinks, fruit juice that does not have water added, and soda. ? Caffeine. ? Foods that are greasy or have a lot of fat or sugar.  Take over-the-counter and prescription medicines only as told by your doctor.  Do not take salt tablets. Doing that can make the salt level in your body get too high (hypernatremia).  Eat foods that have minerals (electrolytes). Examples include bananas, oranges, potatoes, tomatoes, and spinach.  Keep all follow-up visits as told by your doctor. This is important. Contact a doctor if:  You have belly (abdominal) pain that: ? Gets worse. ? Stays in one area (localizes).  You have a rash.  You have a stiff neck.  You get angry or annoyed more easily than normal (irritability).  You are more sleepy than normal.  You have a harder time waking up than normal.  You feel: ? Weak. ? Dizzy. ? Very thirsty.  You have peed (urinated) only a small amount of very dark pee during 6-8 hours. Get help right away if:  You have symptoms of   very bad dehydration.  You cannot drink fluids without throwing up (vomiting).  Your symptoms get worse with treatment.  You have a fever.  You have a very bad headache.  You are throwing up or having watery poop (diarrhea) and it: ? Gets worse. ? Does not go away.  You have blood or something green (bile) in your throw-up.  You have blood in your poop (stool). This may cause poop to look black and tarry.  You have not peed in 6-8 hours.  You pass out (faint).  Your heart rate when you are sitting still is more than 100 beats a  minute.  You have trouble breathing. This information is not intended to replace advice given to you by your health care provider. Make sure you discuss any questions you have with your health care provider. Document Released: 06/27/2009 Document Revised: 03/20/2016 Document Reviewed: 10/25/2015 Elsevier Interactive Patient Education  2018 Elsevier Inc.  

## 2018-06-16 NOTE — Assessment & Plan Note (Signed)
Recent repeat iron study showed resolution of iron deficiency.  We will monitor carefully

## 2018-06-17 ENCOUNTER — Inpatient Hospital Stay: Payer: Medicaid Other

## 2018-06-17 ENCOUNTER — Ambulatory Visit
Admission: RE | Admit: 2018-06-17 | Discharge: 2018-06-17 | Disposition: A | Discharge status: Home / Self Care | Payer: Medicaid Other | Source: Ambulatory Visit | Attending: Radiation Oncology | Admitting: Radiation Oncology

## 2018-06-17 VITALS — BP 124/64 | HR 75 | Temp 98.1°F | Resp 16 | Wt 84.5 lb

## 2018-06-17 DIAGNOSIS — C539 Malignant neoplasm of cervix uteri, unspecified: Secondary | ICD-10-CM

## 2018-06-17 DIAGNOSIS — Z5111 Encounter for antineoplastic chemotherapy: Secondary | ICD-10-CM | POA: Diagnosis not present

## 2018-06-17 DIAGNOSIS — Z510 Encounter for antineoplastic radiation therapy: Secondary | ICD-10-CM | POA: Diagnosis not present

## 2018-06-17 MED ORDER — SODIUM CHLORIDE 0.9% FLUSH
3.0000 mL | Freq: Once | INTRAVENOUS | Status: DC | PRN
  Filled 2018-06-17: qty 10

## 2018-06-17 MED ORDER — POTASSIUM CHLORIDE 2 MEQ/ML IV SOLN
Freq: Once | INTRAVENOUS | Status: AC
  Administered 2018-06-17: 09:00:00 via INTRAVENOUS
  Filled 2018-06-17: qty 10

## 2018-06-17 MED ORDER — SODIUM CHLORIDE 0.9 % IV SOLN
Freq: Once | INTRAVENOUS | Status: AC
  Administered 2018-06-18: 10:00:00 via INTRAVENOUS
  Filled 2018-06-17: qty 1000

## 2018-06-17 MED ORDER — PALONOSETRON HCL INJECTION 0.25 MG/5ML
INTRAVENOUS | Status: AC
  Filled 2018-06-17: qty 5

## 2018-06-17 MED ORDER — SODIUM CHLORIDE 0.9 % IV SOLN
Freq: Once | INTRAVENOUS | Status: AC
  Administered 2018-06-17: 12:00:00 via INTRAVENOUS
  Filled 2018-06-17: qty 5

## 2018-06-17 MED ORDER — HEPARIN SOD (PORK) LOCK FLUSH 100 UNIT/ML IV SOLN
500.0000 [IU] | Freq: Once | INTRAVENOUS | Status: AC | PRN
  Administered 2018-06-17: 500 [IU]
  Filled 2018-06-17: qty 5

## 2018-06-17 MED ORDER — PALONOSETRON HCL INJECTION 0.25 MG/5ML
0.2500 mg | Freq: Once | INTRAVENOUS | Status: AC
  Administered 2018-06-17: 0.25 mg via INTRAVENOUS

## 2018-06-17 MED ORDER — SODIUM CHLORIDE 0.9 % IV SOLN
36.0000 mg/m2 | Freq: Once | INTRAVENOUS | Status: AC
  Administered 2018-06-17: 46 mg via INTRAVENOUS
  Filled 2018-06-17: qty 46

## 2018-06-17 MED ORDER — SODIUM CHLORIDE 0.9% FLUSH
10.0000 mL | INTRAVENOUS | Status: DC | PRN
  Administered 2018-06-17: 10 mL
  Filled 2018-06-17: qty 10

## 2018-06-17 MED ORDER — ALTEPLASE 2 MG IJ SOLR
2.0000 mg | Freq: Once | INTRAMUSCULAR | Status: DC | PRN
  Filled 2018-06-17: qty 2

## 2018-06-17 MED ORDER — SODIUM CHLORIDE 0.9 % IV SOLN
Freq: Once | INTRAVENOUS | Status: AC
  Administered 2018-06-17: 09:00:00 via INTRAVENOUS
  Filled 2018-06-17: qty 250

## 2018-06-17 NOTE — Progress Notes (Addendum)
After 2 hr of pre-hydration IVFs, pt had only voided 125 mL. Received an OK to release pre-meds and Cisplatin from Dr. Alvy Bimler despite low output. Will continue to monitor urine output and update MD/pharmacy accordingly if needed.   Upon returning from radiation, pt's care giver Erin Kent) stated she had a large incontinent episode in the changing room. Was not able to measure the amount since she was off the department, but from Erin Kent's account it appears she voided well over the 125 mL needed to reach the total 250 mL minimum output requirement. Upon assessment, also appeared that Erin Kent feet and ankles were more swollen than earlier to today. Will make Dr. Maurilio Lovely aware.

## 2018-06-17 NOTE — Patient Instructions (Signed)
[facility] Discharge Instructions for Patients Receiving Chemotherapy  Today you received the following chemotherapy agents Cisplatin (Platinol).  To help prevent nausea and vomiting after your treatment, we encourage you to take your nausea medication as prescribed.   If you develop nausea and vomiting that is not controlled by your nausea medication, call the clinic.   BELOW ARE SYMPTOMS THAT SHOULD BE REPORTED IMMEDIATELY:  *FEVER GREATER THAN 100.5 F  *CHILLS WITH OR WITHOUT FEVER  NAUSEA AND VOMITING THAT IS NOT CONTROLLED WITH YOUR NAUSEA MEDICATION  *UNUSUAL SHORTNESS OF BREATH  *UNUSUAL BRUISING OR BLEEDING  TENDERNESS IN MOUTH AND THROAT WITH OR WITHOUT PRESENCE OF ULCERS  *URINARY PROBLEMS  *BOWEL PROBLEMS  UNUSUAL RASH Items with * indicate a potential emergency and should be followed up as soon as possible.  Feel free to call the clinic should you have any questions or concerns. The clinic phone number is [phone].  Please show the [facility] at check-in to the Emergency Department and triage nurse.

## 2018-06-18 ENCOUNTER — Inpatient Hospital Stay: Payer: Medicaid Other

## 2018-06-18 VITALS — BP 110/82 | HR 57 | Temp 98.3°F | Resp 16

## 2018-06-18 DIAGNOSIS — Z5111 Encounter for antineoplastic chemotherapy: Secondary | ICD-10-CM | POA: Diagnosis not present

## 2018-06-18 DIAGNOSIS — C539 Malignant neoplasm of cervix uteri, unspecified: Secondary | ICD-10-CM

## 2018-06-18 MED ORDER — SODIUM CHLORIDE 0.9% FLUSH
10.0000 mL | Freq: Once | INTRAVENOUS | Status: AC
  Administered 2018-06-18: 10 mL
  Filled 2018-06-18: qty 10

## 2018-06-18 MED ORDER — HEPARIN SOD (PORK) LOCK FLUSH 100 UNIT/ML IV SOLN
500.0000 [IU] | Freq: Once | INTRAVENOUS | Status: AC
  Administered 2018-06-18: 500 [IU]
  Filled 2018-06-18: qty 5

## 2018-06-18 NOTE — Patient Instructions (Signed)
Hypokalemia Hypokalemia means that the amount of potassium in the blood is lower than normal.Potassium is a chemical that helps regulate the amount of fluid in the body (electrolyte). It also stimulates muscle tightening (contraction) and helps nerves work properly.Normally, most of the body's potassium is inside of cells, and only a very small amount is in the blood. Because the amount in the blood is so small, minor changes to potassium levels in the blood can be life-threatening. What are the causes? This condition may be caused by:  Antibiotic medicine.  Diarrhea or vomiting. Taking too much of a medicine that helps you have a bowel movement (laxative) can cause diarrhea and lead to hypokalemia.  Chronic kidney disease (CKD).  Medicines that help the body get rid of excess fluid (diuretics).  Eating disorders, such as bulimia.  Low magnesium levels in the body.  Sweating a lot.  What are the signs or symptoms? Symptoms of this condition include:  Weakness.  Constipation.  Fatigue.  Muscle cramps.  Mental confusion.  Skipped heartbeats or irregular heartbeat (palpitations).  Tingling or numbness.  How is this diagnosed? This condition is diagnosed with a blood test. How is this treated? Hypokalemia can be treated by taking potassium supplements by mouth or adjusting the medicines that you take. Treatment may also include eating more foods that contain a lot of potassium. If your potassium level is very low, you may need to get potassium through an IV tube in one of your veins and be monitored in the hospital. Follow these instructions at home:  Take over-the-counter and prescription medicines only as told by your health care provider. This includes vitamins and supplements.  Eat a healthy diet. A healthy diet includes fresh fruits and vegetables, whole grains, healthy fats, and lean proteins.  If instructed, eat more foods that contain a lot of potassium, such  as: ? Nuts, such as peanuts and pistachios. ? Seeds, such as sunflower seeds and pumpkin seeds. ? Peas, lentils, and lima beans. ? Whole grain and bran cereals and breads. ? Fresh fruits and vegetables, such as apricots, avocado, bananas, cantaloupe, kiwi, oranges, tomatoes, asparagus, and potatoes. ? Orange juice. ? Tomato juice. ? Red meats. ? Yogurt.  Keep all follow-up visits as told by your health care provider. This is important. Contact a health care provider if:  You have weakness that gets worse.  You feel your heart pounding or racing.  You vomit.  You have diarrhea.  You have diabetes (diabetes mellitus) and you have trouble keeping your blood sugar (glucose) in your target range. Get help right away if:  You have chest pain.  You have shortness of breath.  You have vomiting or diarrhea that lasts for more than 2 days.  You faint. This information is not intended to replace advice given to you by your health care provider. Make sure you discuss any questions you have with your health care provider. Document Released: 08/31/2005 Document Revised: 04/18/2016 Document Reviewed: 04/18/2016 Elsevier Interactive Patient Education  2018 [employer].   Dehydration, Adult Dehydration is a condition in which there is not enough fluid or water in the body. This happens when you lose more fluids than you take in. Important organs, such as the kidneys, brain, and heart, cannot function without a proper amount of fluids. Any loss of fluids from the body can lead to dehydration. Dehydration can range from mild to severe. This condition should be treated right away to prevent it from becoming severe. What are the  causes? This condition may be caused by:  Vomiting.  Diarrhea.  Excessive sweating, such as from heat exposure or exercise.  Not drinking enough fluid, especially: ? When ill. ? While doing activity that requires a lot of energy.  Excessive  urination.  Fever.  Infection.  Certain medicines, such as medicines that cause the body to lose excess fluid (diuretics).  Inability to access safe drinking water.  Reduced physical ability to get adequate water and food.  What increases the risk? This condition is more likely to develop in people:  Who have a poorly controlled long-term (chronic) illness, such as diabetes, heart disease, or kidney disease.  Who are age 69 or older.  Who are disabled.  Who live in a place with high altitude.  Who play endurance sports.  What are the signs or symptoms? Symptoms of mild dehydration may include:  Thirst.  Dry lips.  Slightly dry mouth.  Dry, warm skin.  Dizziness. Symptoms of moderate dehydration may include:  Very dry mouth.  Muscle cramps.  Dark urine. Urine may be the color of tea.  Decreased urine production.  Decreased tear production.  Heartbeat that is irregular or faster than normal (palpitations).  Headache.  Light-headedness, especially when you stand up from a sitting position.  Fainting (syncope). Symptoms of severe dehydration may include:  Changes in skin, such as: ? Cold and clammy skin. ? Blotchy (mottled) or pale skin. ? Skin that does not quickly return to normal after being lightly pinched and released (poor skin turgor).  Changes in body fluids, such as: ? Extreme thirst. ? No tear production. ? Inability to sweat when body temperature is high, such as in hot weather. ? Very little urine production.  Changes in vital signs, such as: ? Weak pulse. ? Pulse that is more than 100 beats a minute when sitting still. ? Rapid breathing. ? Low blood pressure.  Other changes, such as: ? Sunken eyes. ? Cold hands and feet. ? Confusion. ? Lack of energy (lethargy). ? Difficulty waking up from sleep. ? Short-term weight loss. ? Unconsciousness. How is this diagnosed? This condition is diagnosed based on your symptoms and a  physical exam. Blood and urine tests may be done to help confirm the diagnosis. How is this treated? Treatment for this condition depends on the severity. Mild or moderate dehydration can often be treated at home. Treatment should be started right away. Do not wait until dehydration becomes severe. Severe dehydration is an emergency and it needs to be treated in a hospital. Treatment for mild dehydration may include:  Drinking more fluids.  Replacing salts and minerals in your blood (electrolytes) that you may have lost. Treatment for moderate dehydration may include:  Drinking an oral rehydration solution (ORS). This is a drink that helps you replace fluids and electrolytes (rehydrate). It can be found at pharmacies and retail stores. Treatment for severe dehydration may include:  Receiving fluids through an IV tube.  Receiving an electrolyte solution through a feeding tube that is passed through your nose and into your stomach (nasogastric tube, or NG tube).  Correcting any abnormalities in electrolytes.  Treating the underlying cause of dehydration. Follow these instructions at home:  If directed by your health care provider, drink an ORS: ? Make an ORS by following instructions on the package. ? Start by drinking small amounts, about  cup (120 mL) every 5-10 minutes. ? Slowly increase how much you drink until you have taken the amount recommended by your health  care provider.  Drink enough clear fluid to keep your urine clear or pale yellow. If you were told to drink an ORS, finish the ORS first, then start slowly drinking other clear fluids. Drink fluids such as: ? Water. Do not drink only water. Doing that can lead to having too little salt (sodium) in the body (hyponatremia). ? Ice chips. ? Fruit juice that you have added water to (diluted fruit juice). ? Low-calorie sports drinks.  Avoid: ? Alcohol. ? Drinks that contain a lot of sugar. These include high-calorie sports  drinks, fruit juice that is not diluted, and soda. ? Caffeine. ? Foods that are greasy or contain a lot of fat or sugar.  Take over-the-counter and prescription medicines only as told by your health care provider.  Do not take sodium tablets. This can lead to having too much sodium in the body (hypernatremia).  Eat foods that contain a healthy balance of electrolytes, such as bananas, oranges, potatoes, tomatoes, and spinach.  Keep all follow-up visits as told by your health care provider. This is important. Contact a health care provider if:  You have abdominal pain that: ? Gets worse. ? Stays in one area (localizes).  You have a rash.  You have a stiff neck.  You are more irritable than usual.  You are sleepier or more difficult to wake up than usual.  You feel weak or dizzy.  You feel very thirsty.  You have urinated only a small amount of very dark urine over 6-8 hours. Get help right away if:  You have symptoms of severe dehydration.  You cannot drink fluids without vomiting.  Your symptoms get worse with treatment.  You have a fever.  You have a severe headache.  You have vomiting or diarrhea that: ? Gets worse. ? Does not go away.  You have blood or green matter (bile) in your vomit.  You have blood in your stool. This may cause stool to look black and tarry.  You have not urinated in 6-8 hours.  You faint.  Your heart rate while sitting still is over 100 beats a minute.  You have trouble breathing. This information is not intended to replace advice given to you by your health care provider. Make sure you discuss any questions you have with your health care provider. Document Released: 08/31/2005 Document Revised: 03/27/2016 Document Reviewed: 10/25/2015 Elsevier Interactive Patient Education  [employer].

## 2018-06-20 ENCOUNTER — Ambulatory Visit
Admission: RE | Admit: 2018-06-20 | Discharge: 2018-06-20 | Disposition: A | Discharge status: Home / Self Care | Payer: Medicaid Other | Source: Ambulatory Visit | Attending: Radiation Oncology | Admitting: Radiation Oncology

## 2018-06-20 ENCOUNTER — Inpatient Hospital Stay: Payer: Medicaid Other

## 2018-06-20 ENCOUNTER — Other Ambulatory Visit: Payer: Self-pay | Admitting: Hematology and Oncology

## 2018-06-20 VITALS — BP 138/81 | HR 60 | Temp 97.7°F | Resp 16

## 2018-06-20 DIAGNOSIS — C539 Malignant neoplasm of cervix uteri, unspecified: Secondary | ICD-10-CM

## 2018-06-20 DIAGNOSIS — Z5111 Encounter for antineoplastic chemotherapy: Secondary | ICD-10-CM | POA: Diagnosis not present

## 2018-06-20 DIAGNOSIS — Z510 Encounter for antineoplastic radiation therapy: Secondary | ICD-10-CM | POA: Diagnosis not present

## 2018-06-20 MED ORDER — SODIUM CHLORIDE 0.9% FLUSH
10.0000 mL | Freq: Once | INTRAVENOUS | Status: AC
  Administered 2018-06-20: 10 mL
  Filled 2018-06-20: qty 10

## 2018-06-20 MED ORDER — SODIUM CHLORIDE 0.9% FLUSH
3.0000 mL | Freq: Once | INTRAVENOUS | Status: DC | PRN
  Filled 2018-06-20: qty 10

## 2018-06-20 MED ORDER — SODIUM CHLORIDE 0.9 % IV SOLN
Freq: Once | INTRAVENOUS | Status: AC
  Administered 2018-06-20: 13:00:00 via INTRAVENOUS
  Filled 2018-06-20: qty 250

## 2018-06-20 MED ORDER — SODIUM CHLORIDE 0.45 % IV SOLN
Freq: Once | INTRAVENOUS | Status: AC
  Administered 2018-06-20: 14:00:00 via INTRAVENOUS
  Filled 2018-06-20: qty 750

## 2018-06-20 MED ORDER — ALTEPLASE 2 MG IJ SOLR
2.0000 mg | Freq: Once | INTRAMUSCULAR | Status: DC | PRN
  Filled 2018-06-20: qty 2

## 2018-06-20 MED ORDER — HEPARIN SOD (PORK) LOCK FLUSH 100 UNIT/ML IV SOLN
500.0000 [IU] | Freq: Once | INTRAVENOUS | Status: AC
  Administered 2018-06-20: 500 [IU]
  Filled 2018-06-20: qty 5

## 2018-06-20 MED ORDER — HEPARIN SOD (PORK) LOCK FLUSH 100 UNIT/ML IV SOLN
250.0000 [IU] | Freq: Once | INTRAVENOUS | Status: DC | PRN
  Filled 2018-06-20: qty 5

## 2018-06-20 NOTE — Patient Instructions (Signed)
Hypokalemia Hypokalemia means that the amount of potassium in the blood is lower than normal.Potassium is a chemical that helps regulate the amount of fluid in the body (electrolyte). It also stimulates muscle tightening (contraction) and helps nerves work properly.Normally, most of the body's potassium is inside of cells, and only a very small amount is in the blood. Because the amount in the blood is so small, minor changes to potassium levels in the blood can be life-threatening. What are the causes? This condition may be caused by:  Antibiotic medicine.  Diarrhea or vomiting. Taking too much of a medicine that helps you have a bowel movement (laxative) can cause diarrhea and lead to hypokalemia.  Chronic kidney disease (CKD).  Medicines that help the body get rid of excess fluid (diuretics).  Eating disorders, such as bulimia.  Low magnesium levels in the body.  Sweating a lot.  What are the signs or symptoms? Symptoms of this condition include:  Weakness.  Constipation.  Fatigue.  Muscle cramps.  Mental confusion.  Skipped heartbeats or irregular heartbeat (palpitations).  Tingling or numbness.  How is this diagnosed? This condition is diagnosed with a blood test. How is this treated? Hypokalemia can be treated by taking potassium supplements by mouth or adjusting the medicines that you take. Treatment may also include eating more foods that contain a lot of potassium. If your potassium level is very low, you may need to get potassium through an IV tube in one of your veins and be monitored in the hospital. Follow these instructions at home:  Take over-the-counter and prescription medicines only as told by your health care provider. This includes vitamins and supplements.  Eat a healthy diet. A healthy diet includes fresh fruits and vegetables, whole grains, healthy fats, and lean proteins.  If instructed, eat more foods that contain a lot of potassium, such  as: ? Nuts, such as peanuts and pistachios. ? Seeds, such as sunflower seeds and pumpkin seeds. ? Peas, lentils, and lima beans. ? Whole grain and bran cereals and breads. ? Fresh fruits and vegetables, such as apricots, avocado, bananas, cantaloupe, kiwi, oranges, tomatoes, asparagus, and potatoes. ? Orange juice. ? Tomato juice. ? Red meats. ? Yogurt.  Keep all follow-up visits as told by your health care provider. This is important. Contact a health care provider if:  You have weakness that gets worse.  You feel your heart pounding or racing.  You vomit.  You have diarrhea.  You have diabetes (diabetes mellitus) and you have trouble keeping your blood sugar (glucose) in your target range. Get help right away if:  You have chest pain.  You have shortness of breath.  You have vomiting or diarrhea that lasts for more than 2 days.  You faint. This information is not intended to replace advice given to you by your health care provider. Make sure you discuss any questions you have with your health care provider. Document Released: 08/31/2005 Document Revised: 04/18/2016 Document Reviewed: 04/18/2016 Elsevier Interactive Patient Education  2018 [employer].   Dehydration, Adult Dehydration is a condition in which there is not enough fluid or water in the body. This happens when you lose more fluids than you take in. Important organs, such as the kidneys, brain, and heart, cannot function without a proper amount of fluids. Any loss of fluids from the body can lead to dehydration. Dehydration can range from mild to severe. This condition should be treated right away to prevent it from becoming severe. What are the  causes? This condition may be caused by:  Vomiting.  Diarrhea.  Excessive sweating, such as from heat exposure or exercise.  Not drinking enough fluid, especially: ? When ill. ? While doing activity that requires a lot of energy.  Excessive  urination.  Fever.  Infection.  Certain medicines, such as medicines that cause the body to lose excess fluid (diuretics).  Inability to access safe drinking water.  Reduced physical ability to get adequate water and food.  What increases the risk? This condition is more likely to develop in people:  Who have a poorly controlled long-term (chronic) illness, such as diabetes, heart disease, or kidney disease.  Who are age 75 or older.  Who are disabled.  Who live in a place with high altitude.  Who play endurance sports.  What are the signs or symptoms? Symptoms of mild dehydration may include:  Thirst.  Dry lips.  Slightly dry mouth.  Dry, warm skin.  Dizziness. Symptoms of moderate dehydration may include:  Very dry mouth.  Muscle cramps.  Dark urine. Urine may be the color of tea.  Decreased urine production.  Decreased tear production.  Heartbeat that is irregular or faster than normal (palpitations).  Headache.  Light-headedness, especially when you stand up from a sitting position.  Fainting (syncope). Symptoms of severe dehydration may include:  Changes in skin, such as: ? Cold and clammy skin. ? Blotchy (mottled) or pale skin. ? Skin that does not quickly return to normal after being lightly pinched and released (poor skin turgor).  Changes in body fluids, such as: ? Extreme thirst. ? No tear production. ? Inability to sweat when body temperature is high, such as in hot weather. ? Very little urine production.  Changes in vital signs, such as: ? Weak pulse. ? Pulse that is more than 100 beats a minute when sitting still. ? Rapid breathing. ? Low blood pressure.  Other changes, such as: ? Sunken eyes. ? Cold hands and feet. ? Confusion. ? Lack of energy (lethargy). ? Difficulty waking up from sleep. ? Short-term weight loss. ? Unconsciousness. How is this diagnosed? This condition is diagnosed based on your symptoms and a  physical exam. Blood and urine tests may be done to help confirm the diagnosis. How is this treated? Treatment for this condition depends on the severity. Mild or moderate dehydration can often be treated at home. Treatment should be started right away. Do not wait until dehydration becomes severe. Severe dehydration is an emergency and it needs to be treated in a hospital. Treatment for mild dehydration may include:  Drinking more fluids.  Replacing salts and minerals in your blood (electrolytes) that you may have lost. Treatment for moderate dehydration may include:  Drinking an oral rehydration solution (ORS). This is a drink that helps you replace fluids and electrolytes (rehydrate). It can be found at pharmacies and retail stores. Treatment for severe dehydration may include:  Receiving fluids through an IV tube.  Receiving an electrolyte solution through a feeding tube that is passed through your nose and into your stomach (nasogastric tube, or NG tube).  Correcting any abnormalities in electrolytes.  Treating the underlying cause of dehydration. Follow these instructions at home:  If directed by your health care provider, drink an ORS: ? Make an ORS by following instructions on the package. ? Start by drinking small amounts, about  cup (120 mL) every 5-10 minutes. ? Slowly increase how much you drink until you have taken the amount recommended by your health  care provider.  Drink enough clear fluid to keep your urine clear or pale yellow. If you were told to drink an ORS, finish the ORS first, then start slowly drinking other clear fluids. Drink fluids such as: ? Water. Do not drink only water. Doing that can lead to having too little salt (sodium) in the body (hyponatremia). ? Ice chips. ? Fruit juice that you have added water to (diluted fruit juice). ? Low-calorie sports drinks.  Avoid: ? Alcohol. ? Drinks that contain a lot of sugar. These include high-calorie sports  drinks, fruit juice that is not diluted, and soda. ? Caffeine. ? Foods that are greasy or contain a lot of fat or sugar.  Take over-the-counter and prescription medicines only as told by your health care provider.  Do not take sodium tablets. This can lead to having too much sodium in the body (hypernatremia).  Eat foods that contain a healthy balance of electrolytes, such as bananas, oranges, potatoes, tomatoes, and spinach.  Keep all follow-up visits as told by your health care provider. This is important. Contact a health care provider if:  You have abdominal pain that: ? Gets worse. ? Stays in one area (localizes).  You have a rash.  You have a stiff neck.  You are more irritable than usual.  You are sleepier or more difficult to wake up than usual.  You feel weak or dizzy.  You feel very thirsty.  You have urinated only a small amount of very dark urine over 6-8 hours. Get help right away if:  You have symptoms of severe dehydration.  You cannot drink fluids without vomiting.  Your symptoms get worse with treatment.  You have a fever.  You have a severe headache.  You have vomiting or diarrhea that: ? Gets worse. ? Does not go away.  You have blood or green matter (bile) in your vomit.  You have blood in your stool. This may cause stool to look black and tarry.  You have not urinated in 6-8 hours.  You faint.  Your heart rate while sitting still is over 100 beats a minute.  You have trouble breathing. This information is not intended to replace advice given to you by your health care provider. Make sure you discuss any questions you have with your health care provider. Document Released: 08/31/2005 Document Revised: 03/27/2016 Document Reviewed: 10/25/2015 Elsevier Interactive Patient Education  [employer].

## 2018-06-21 ENCOUNTER — Ambulatory Visit
Admission: RE | Admit: 2018-06-21 | Discharge: 2018-06-21 | Disposition: A | Discharge status: Home / Self Care | Payer: Medicaid Other | Source: Ambulatory Visit | Attending: Radiation Oncology | Admitting: Radiation Oncology

## 2018-06-21 ENCOUNTER — Inpatient Hospital Stay: Payer: Medicaid Other

## 2018-06-21 VITALS — BP 124/71 | HR 72 | Temp 97.7°F | Resp 16

## 2018-06-21 DIAGNOSIS — Z5111 Encounter for antineoplastic chemotherapy: Secondary | ICD-10-CM | POA: Diagnosis not present

## 2018-06-21 DIAGNOSIS — C539 Malignant neoplasm of cervix uteri, unspecified: Secondary | ICD-10-CM

## 2018-06-21 DIAGNOSIS — Z510 Encounter for antineoplastic radiation therapy: Secondary | ICD-10-CM | POA: Diagnosis not present

## 2018-06-21 MED ORDER — SODIUM CHLORIDE 0.9 % IV SOLN
Freq: Once | INTRAVENOUS | Status: AC
  Administered 2018-06-21: 14:00:00 via INTRAVENOUS
  Filled 2018-06-21: qty 250

## 2018-06-21 MED ORDER — HEPARIN SOD (PORK) LOCK FLUSH 100 UNIT/ML IV SOLN
500.0000 [IU] | Freq: Once | INTRAVENOUS | Status: AC
  Administered 2018-06-21: 500 [IU]
  Filled 2018-06-21: qty 5

## 2018-06-21 MED ORDER — SODIUM CHLORIDE 0.9% FLUSH
10.0000 mL | Freq: Once | INTRAVENOUS | Status: AC
  Administered 2018-06-21: 10 mL
  Filled 2018-06-21: qty 10

## 2018-06-21 MED ORDER — SODIUM CHLORIDE 0.45 % IV SOLN
Freq: Once | INTRAVENOUS | Status: AC
  Administered 2018-06-21: 14:00:00 via INTRAVENOUS
  Filled 2018-06-21: qty 750

## 2018-06-21 NOTE — Patient Instructions (Signed)
Hypokalemia Hypokalemia means that the amount of potassium in the blood is lower than normal.Potassium is a chemical that helps regulate the amount of fluid in the body (electrolyte). It also stimulates muscle tightening (contraction) and helps nerves work properly.Normally, most of the body's potassium is inside of cells, and only a very small amount is in the blood. Because the amount in the blood is so small, minor changes to potassium levels in the blood can be life-threatening. What are the causes? This condition may be caused by:  Antibiotic medicine.  Diarrhea or vomiting. Taking too much of a medicine that helps you have a bowel movement (laxative) can cause diarrhea and lead to hypokalemia.  Chronic kidney disease (CKD).  Medicines that help the body get rid of excess fluid (diuretics).  Eating disorders, such as bulimia.  Low magnesium levels in the body.  Sweating a lot.  What are the signs or symptoms? Symptoms of this condition include:  Weakness.  Constipation.  Fatigue.  Muscle cramps.  Mental confusion.  Skipped heartbeats or irregular heartbeat (palpitations).  Tingling or numbness.  How is this diagnosed? This condition is diagnosed with a blood test. How is this treated? Hypokalemia can be treated by taking potassium supplements by mouth or adjusting the medicines that you take. Treatment may also include eating more foods that contain a lot of potassium. If your potassium level is very low, you may need to get potassium through an IV tube in one of your veins and be monitored in the hospital. Follow these instructions at home:  Take over-the-counter and prescription medicines only as told by your health care provider. This includes vitamins and supplements.  Eat a healthy diet. A healthy diet includes fresh fruits and vegetables, whole grains, healthy fats, and lean proteins.  If instructed, eat more foods that contain a lot of potassium, such  as: ? Nuts, such as peanuts and pistachios. ? Seeds, such as sunflower seeds and pumpkin seeds. ? Peas, lentils, and lima beans. ? Whole grain and bran cereals and breads. ? Fresh fruits and vegetables, such as apricots, avocado, bananas, cantaloupe, kiwi, oranges, tomatoes, asparagus, and potatoes. ? Orange juice. ? Tomato juice. ? Red meats. ? Yogurt.  Keep all follow-up visits as told by your health care provider. This is important. Contact a health care provider if:  You have weakness that gets worse.  You feel your heart pounding or racing.  You vomit.  You have diarrhea.  You have diabetes (diabetes mellitus) and you have trouble keeping your blood sugar (glucose) in your target range. Get help right away if:  You have chest pain.  You have shortness of breath.  You have vomiting or diarrhea that lasts for more than 2 days.  You faint. This information is not intended to replace advice given to you by your health care provider. Make sure you discuss any questions you have with your health care provider. Document Released: 08/31/2005 Document Revised: 04/18/2016 Document Reviewed: 04/18/2016 Elsevier Interactive Patient Education  2018 [employer].   Dehydration, Adult Dehydration is a condition in which there is not enough fluid or water in the body. This happens when you lose more fluids than you take in. Important organs, such as the kidneys, brain, and heart, cannot function without a proper amount of fluids. Any loss of fluids from the body can lead to dehydration. Dehydration can range from mild to severe. This condition should be treated right away to prevent it from becoming severe. What are the  causes? This condition may be caused by:  Vomiting.  Diarrhea.  Excessive sweating, such as from heat exposure or exercise.  Not drinking enough fluid, especially: ? When ill. ? While doing activity that requires a lot of energy.  Excessive  urination.  Fever.  Infection.  Certain medicines, such as medicines that cause the body to lose excess fluid (diuretics).  Inability to access safe drinking water.  Reduced physical ability to get adequate water and food.  What increases the risk? This condition is more likely to develop in people:  Who have a poorly controlled long-term (chronic) illness, such as diabetes, heart disease, or kidney disease.  Who are age 64 or older.  Who are disabled.  Who live in a place with high altitude.  Who play endurance sports.  What are the signs or symptoms? Symptoms of mild dehydration may include:  Thirst.  Dry lips.  Slightly dry mouth.  Dry, warm skin.  Dizziness. Symptoms of moderate dehydration may include:  Very dry mouth.  Muscle cramps.  Dark urine. Urine may be the color of tea.  Decreased urine production.  Decreased tear production.  Heartbeat that is irregular or faster than normal (palpitations).  Headache.  Light-headedness, especially when you stand up from a sitting position.  Fainting (syncope). Symptoms of severe dehydration may include:  Changes in skin, such as: ? Cold and clammy skin. ? Blotchy (mottled) or pale skin. ? Skin that does not quickly return to normal after being lightly pinched and released (poor skin turgor).  Changes in body fluids, such as: ? Extreme thirst. ? No tear production. ? Inability to sweat when body temperature is high, such as in hot weather. ? Very little urine production.  Changes in vital signs, such as: ? Weak pulse. ? Pulse that is more than 100 beats a minute when sitting still. ? Rapid breathing. ? Low blood pressure.  Other changes, such as: ? Sunken eyes. ? Cold hands and feet. ? Confusion. ? Lack of energy (lethargy). ? Difficulty waking up from sleep. ? Short-term weight loss. ? Unconsciousness. How is this diagnosed? This condition is diagnosed based on your symptoms and a  physical exam. Blood and urine tests may be done to help confirm the diagnosis. How is this treated? Treatment for this condition depends on the severity. Mild or moderate dehydration can often be treated at home. Treatment should be started right away. Do not wait until dehydration becomes severe. Severe dehydration is an emergency and it needs to be treated in a hospital. Treatment for mild dehydration may include:  Drinking more fluids.  Replacing salts and minerals in your blood (electrolytes) that you may have lost. Treatment for moderate dehydration may include:  Drinking an oral rehydration solution (ORS). This is a drink that helps you replace fluids and electrolytes (rehydrate). It can be found at pharmacies and retail stores. Treatment for severe dehydration may include:  Receiving fluids through an IV tube.  Receiving an electrolyte solution through a feeding tube that is passed through your nose and into your stomach (nasogastric tube, or NG tube).  Correcting any abnormalities in electrolytes.  Treating the underlying cause of dehydration. Follow these instructions at home:  If directed by your health care provider, drink an ORS: ? Make an ORS by following instructions on the package. ? Start by drinking small amounts, about  cup (120 mL) every 5-10 minutes. ? Slowly increase how much you drink until you have taken the amount recommended by your health  care provider.  Drink enough clear fluid to keep your urine clear or pale yellow. If you were told to drink an ORS, finish the ORS first, then start slowly drinking other clear fluids. Drink fluids such as: ? Water. Do not drink only water. Doing that can lead to having too little salt (sodium) in the body (hyponatremia). ? Ice chips. ? Fruit juice that you have added water to (diluted fruit juice). ? Low-calorie sports drinks.  Avoid: ? Alcohol. ? Drinks that contain a lot of sugar. These include high-calorie sports  drinks, fruit juice that is not diluted, and soda. ? Caffeine. ? Foods that are greasy or contain a lot of fat or sugar.  Take over-the-counter and prescription medicines only as told by your health care provider.  Do not take sodium tablets. This can lead to having too much sodium in the body (hypernatremia).  Eat foods that contain a healthy balance of electrolytes, such as bananas, oranges, potatoes, tomatoes, and spinach.  Keep all follow-up visits as told by your health care provider. This is important. Contact a health care provider if:  You have abdominal pain that: ? Gets worse. ? Stays in one area (localizes).  You have a rash.  You have a stiff neck.  You are more irritable than usual.  You are sleepier or more difficult to wake up than usual.  You feel weak or dizzy.  You feel very thirsty.  You have urinated only a small amount of very dark urine over 6-8 hours. Get help right away if:  You have symptoms of severe dehydration.  You cannot drink fluids without vomiting.  Your symptoms get worse with treatment.  You have a fever.  You have a severe headache.  You have vomiting or diarrhea that: ? Gets worse. ? Does not go away.  You have blood or green matter (bile) in your vomit.  You have blood in your stool. This may cause stool to look black and tarry.  You have not urinated in 6-8 hours.  You faint.  Your heart rate while sitting still is over 100 beats a minute.  You have trouble breathing. This information is not intended to replace advice given to you by your health care provider. Make sure you discuss any questions you have with your health care provider. Document Released: 08/31/2005 Document Revised: 03/27/2016 Document Reviewed: 10/25/2015 Elsevier Interactive Patient Education  [employer].

## 2018-06-22 ENCOUNTER — Encounter (HOSPITAL_BASED_OUTPATIENT_CLINIC_OR_DEPARTMENT_OTHER): Payer: Self-pay

## 2018-06-22 ENCOUNTER — Other Ambulatory Visit: Payer: Self-pay

## 2018-06-22 ENCOUNTER — Inpatient Hospital Stay: Payer: Medicaid Other

## 2018-06-22 ENCOUNTER — Ambulatory Visit
Admission: RE | Admit: 2018-06-22 | Discharge: 2018-06-22 | Disposition: A | Discharge status: Home / Self Care | Payer: Medicaid Other | Source: Ambulatory Visit | Attending: Radiation Oncology | Admitting: Radiation Oncology

## 2018-06-22 VITALS — BP 101/69 | HR 79 | Temp 98.0°F | Resp 16

## 2018-06-22 DIAGNOSIS — C539 Malignant neoplasm of cervix uteri, unspecified: Secondary | ICD-10-CM

## 2018-06-22 DIAGNOSIS — Z5111 Encounter for antineoplastic chemotherapy: Secondary | ICD-10-CM | POA: Diagnosis not present

## 2018-06-22 DIAGNOSIS — Z510 Encounter for antineoplastic radiation therapy: Secondary | ICD-10-CM | POA: Diagnosis not present

## 2018-06-22 LAB — COMPREHENSIVE METABOLIC PANEL
ALT: 11 U/L (ref 0–44)
ANION GAP: 9 (ref 5–15)
AST: 12 U/L — AB (ref 15–41)
Albumin: 3.4 g/dL — ABNORMAL LOW (ref 3.5–5.0)
Alkaline Phosphatase: 60 U/L (ref 38–126)
BILIRUBIN TOTAL: 0.2 mg/dL — AB (ref 0.3–1.2)
BUN: 13 mg/dL (ref 8–23)
CALCIUM: 8.8 mg/dL — AB (ref 8.9–10.3)
CO2: 30 mmol/L (ref 22–32)
Chloride: 100 mmol/L (ref 98–111)
Creatinine, Ser: 0.82 mg/dL (ref 0.44–1.00)
GFR calc Af Amer: >60 mL/min (ref >60)
GFR calc non Af Amer: >60 mL/min (ref >60)
GLUCOSE: 86 mg/dL (ref 70–99)
POTASSIUM: 3.6 mmol/L (ref 3.5–5.1)
SODIUM: 139 mmol/L (ref 135–145)
TOTAL PROTEIN: 6.1 g/dL — AB (ref 6.5–8.1)

## 2018-06-22 LAB — CBC WITH DIFFERENTIAL/PLATELET
Abs Immature Granulocytes: 0.03 10*3/uL (ref 0.00–0.07)
BASOS ABS: 0 10*3/uL (ref 0.0–0.1)
Basophils Relative: 0 %
EOS ABS: 0.1 10*3/uL (ref 0.0–0.5)
EOS PCT: 1 %
HEMATOCRIT: 31.6 % — AB (ref 36.0–46.0)
HEMOGLOBIN: 10 g/dL — AB (ref 12.0–15.0)
IMMATURE GRANULOCYTES: 1 %
LYMPHS ABS: 0.2 10*3/uL — AB (ref 0.7–4.0)
LYMPHS PCT: 4 %
MCH: 26.6 pg (ref 26.0–34.0)
MCHC: 31.6 g/dL (ref 30.0–36.0)
MCV: 84 fL (ref 80.0–100.0)
Monocytes Absolute: 0.4 10*3/uL (ref 0.1–1.0)
Monocytes Relative: 10 %
NEUTROS PCT: 84 %
Neutro Abs: 3.5 10*3/uL (ref 1.7–7.7)
Platelets: 181 10*3/uL (ref 150–400)
RBC: 3.76 MIL/uL — AB (ref 3.87–5.11)
RDW: 24.5 % — AB (ref 11.5–15.5)
WBC: 4.1 10*3/uL (ref 4.0–10.5)
nRBC: 0 % (ref 0.0–0.2)

## 2018-06-22 LAB — MAGNESIUM: Magnesium: 1.8 mg/dL (ref 1.7–2.4)

## 2018-06-22 MED ORDER — SODIUM CHLORIDE 0.9% FLUSH
10.0000 mL | Freq: Once | INTRAVENOUS | Status: AC | PRN
  Administered 2018-06-22: 10 mL
  Filled 2018-06-22: qty 10

## 2018-06-22 MED ORDER — SODIUM CHLORIDE 0.45 % IV SOLN
Freq: Once | INTRAVENOUS | Status: AC
  Administered 2018-06-22: 13:00:00 via INTRAVENOUS
  Filled 2018-06-22: qty 750

## 2018-06-22 MED ORDER — HEPARIN SOD (PORK) LOCK FLUSH 100 UNIT/ML IV SOLN
500.0000 [IU] | Freq: Once | INTRAVENOUS | Status: AC | PRN
  Administered 2018-06-22: 500 [IU]
  Filled 2018-06-22: qty 5

## 2018-06-22 NOTE — Patient Instructions (Signed)
Hypokalemia Hypokalemia means that the amount of potassium in the blood is lower than normal.Potassium is a chemical that helps regulate the amount of fluid in the body (electrolyte). It also stimulates muscle tightening (contraction) and helps nerves work properly.Normally, most of the body's potassium is inside of cells, and only a very small amount is in the blood. Because the amount in the blood is so small, minor changes to potassium levels in the blood can be life-threatening. What are the causes? This condition may be caused by:  Antibiotic medicine.  Diarrhea or vomiting. Taking too much of a medicine that helps you have a bowel movement (laxative) can cause diarrhea and lead to hypokalemia.  Chronic kidney disease (CKD).  Medicines that help the body get rid of excess fluid (diuretics).  Eating disorders, such as bulimia.  Low magnesium levels in the body.  Sweating a lot.  What are the signs or symptoms? Symptoms of this condition include:  Weakness.  Constipation.  Fatigue.  Muscle cramps.  Mental confusion.  Skipped heartbeats or irregular heartbeat (palpitations).  Tingling or numbness.  How is this diagnosed? This condition is diagnosed with a blood test. How is this treated? Hypokalemia can be treated by taking potassium supplements by mouth or adjusting the medicines that you take. Treatment may also include eating more foods that contain a lot of potassium. If your potassium level is very low, you may need to get potassium through an IV tube in one of your veins and be monitored in the hospital. Follow these instructions at home:  Take over-the-counter and prescription medicines only as told by your health care provider. This includes vitamins and supplements.  Eat a healthy diet. A healthy diet includes fresh fruits and vegetables, whole grains, healthy fats, and lean proteins.  If instructed, eat more foods that contain a lot of potassium, such  as: ? Nuts, such as peanuts and pistachios. ? Seeds, such as sunflower seeds and pumpkin seeds. ? Peas, lentils, and lima beans. ? Whole grain and bran cereals and breads. ? Fresh fruits and vegetables, such as apricots, avocado, bananas, cantaloupe, kiwi, oranges, tomatoes, asparagus, and potatoes. ? Orange juice. ? Tomato juice. ? Red meats. ? Yogurt.  Keep all follow-up visits as told by your health care provider. This is important. Contact a health care provider if:  You have weakness that gets worse.  You feel your heart pounding or racing.  You vomit.  You have diarrhea.  You have diabetes (diabetes mellitus) and you have trouble keeping your blood sugar (glucose) in your target range. Get help right away if:  You have chest pain.  You have shortness of breath.  You have vomiting or diarrhea that lasts for more than 2 days.  You faint. This information is not intended to replace advice given to you by your health care provider. Make sure you discuss any questions you have with your health care provider. Document Released: 08/31/2005 Document Revised: 04/18/2016 Document Reviewed: 04/18/2016 Elsevier Interactive Patient Education  2018 [employer].   Dehydration, Adult Dehydration is a condition in which there is not enough fluid or water in the body. This happens when you lose more fluids than you take in. Important organs, such as the kidneys, brain, and heart, cannot function without a proper amount of fluids. Any loss of fluids from the body can lead to dehydration. Dehydration can range from mild to severe. This condition should be treated right away to prevent it from becoming severe. What are the  causes? This condition may be caused by:  Vomiting.  Diarrhea.  Excessive sweating, such as from heat exposure or exercise.  Not drinking enough fluid, especially: ? When ill. ? While doing activity that requires a lot of energy.  Excessive  urination.  Fever.  Infection.  Certain medicines, such as medicines that cause the body to lose excess fluid (diuretics).  Inability to access safe drinking water.  Reduced physical ability to get adequate water and food.  What increases the risk? This condition is more likely to develop in people:  Who have a poorly controlled long-term (chronic) illness, such as diabetes, heart disease, or kidney disease.  Who are age 66 or older.  Who are disabled.  Who live in a place with high altitude.  Who play endurance sports.  What are the signs or symptoms? Symptoms of mild dehydration may include:  Thirst.  Dry lips.  Slightly dry mouth.  Dry, warm skin.  Dizziness. Symptoms of moderate dehydration may include:  Very dry mouth.  Muscle cramps.  Dark urine. Urine may be the color of tea.  Decreased urine production.  Decreased tear production.  Heartbeat that is irregular or faster than normal (palpitations).  Headache.  Light-headedness, especially when you stand up from a sitting position.  Fainting (syncope). Symptoms of severe dehydration may include:  Changes in skin, such as: ? Cold and clammy skin. ? Blotchy (mottled) or pale skin. ? Skin that does not quickly return to normal after being lightly pinched and released (poor skin turgor).  Changes in body fluids, such as: ? Extreme thirst. ? No tear production. ? Inability to sweat when body temperature is high, such as in hot weather. ? Very little urine production.  Changes in vital signs, such as: ? Weak pulse. ? Pulse that is more than 100 beats a minute when sitting still. ? Rapid breathing. ? Low blood pressure.  Other changes, such as: ? Sunken eyes. ? Cold hands and feet. ? Confusion. ? Lack of energy (lethargy). ? Difficulty waking up from sleep. ? Short-term weight loss. ? Unconsciousness. How is this diagnosed? This condition is diagnosed based on your symptoms and a  physical exam. Blood and urine tests may be done to help confirm the diagnosis. How is this treated? Treatment for this condition depends on the severity. Mild or moderate dehydration can often be treated at home. Treatment should be started right away. Do not wait until dehydration becomes severe. Severe dehydration is an emergency and it needs to be treated in a hospital. Treatment for mild dehydration may include:  Drinking more fluids.  Replacing salts and minerals in your blood (electrolytes) that you may have lost. Treatment for moderate dehydration may include:  Drinking an oral rehydration solution (ORS). This is a drink that helps you replace fluids and electrolytes (rehydrate). It can be found at pharmacies and retail stores. Treatment for severe dehydration may include:  Receiving fluids through an IV tube.  Receiving an electrolyte solution through a feeding tube that is passed through your nose and into your stomach (nasogastric tube, or NG tube).  Correcting any abnormalities in electrolytes.  Treating the underlying cause of dehydration. Follow these instructions at home:  If directed by your health care provider, drink an ORS: ? Make an ORS by following instructions on the package. ? Start by drinking small amounts, about  cup (120 mL) every 5-10 minutes. ? Slowly increase how much you drink until you have taken the amount recommended by your health  care provider.  Drink enough clear fluid to keep your urine clear or pale yellow. If you were told to drink an ORS, finish the ORS first, then start slowly drinking other clear fluids. Drink fluids such as: ? Water. Do not drink only water. Doing that can lead to having too little salt (sodium) in the body (hyponatremia). ? Ice chips. ? Fruit juice that you have added water to (diluted fruit juice). ? Low-calorie sports drinks.  Avoid: ? Alcohol. ? Drinks that contain a lot of sugar. These include high-calorie sports  drinks, fruit juice that is not diluted, and soda. ? Caffeine. ? Foods that are greasy or contain a lot of fat or sugar.  Take over-the-counter and prescription medicines only as told by your health care provider.  Do not take sodium tablets. This can lead to having too much sodium in the body (hypernatremia).  Eat foods that contain a healthy balance of electrolytes, such as bananas, oranges, potatoes, tomatoes, and spinach.  Keep all follow-up visits as told by your health care provider. This is important. Contact a health care provider if:  You have abdominal pain that: ? Gets worse. ? Stays in one area (localizes).  You have a rash.  You have a stiff neck.  You are more irritable than usual.  You are sleepier or more difficult to wake up than usual.  You feel weak or dizzy.  You feel very thirsty.  You have urinated only a small amount of very dark urine over 6-8 hours. Get help right away if:  You have symptoms of severe dehydration.  You cannot drink fluids without vomiting.  Your symptoms get worse with treatment.  You have a fever.  You have a severe headache.  You have vomiting or diarrhea that: ? Gets worse. ? Does not go away.  You have blood or green matter (bile) in your vomit.  You have blood in your stool. This may cause stool to look black and tarry.  You have not urinated in 6-8 hours.  You faint.  Your heart rate while sitting still is over 100 beats a minute.  You have trouble breathing. This information is not intended to replace advice given to you by your health care provider. Make sure you discuss any questions you have with your health care provider. Document Released: 08/31/2005 Document Revised: 03/27/2016 Document Reviewed: 10/25/2015 Elsevier Interactive Patient Education  [employer].

## 2018-06-22 NOTE — Progress Notes (Signed)
Spoke with: Dara NPO:  After Midnight, no gum, candy, or mints   Arrival time:  0530AM Labs: Istat 4 AM medications: None Pre op orders: Yes Ride home:  Fannie (caretaker/friend) [phone]

## 2018-06-22 NOTE — Patient Instructions (Signed)
Implanted Port Home Guide An implanted port is a type of central line that is placed under the skin. Central lines are used to provide IV access when treatment or nutrition needs to be given through a person's veins. Implanted ports are used for long-term IV access. An implanted port may be placed because:  You need IV medicine that would be irritating to the small veins in your hands or arms.  You need long-term IV medicines, such as antibiotics.  You need IV nutrition for a long period.  You need frequent blood draws for lab tests.  You need dialysis.  Implanted ports are usually placed in the chest area, but they can also be placed in the upper arm, the abdomen, or the leg. An implanted port has two main parts:  Reservoir. The reservoir is round and will appear as a small, raised area under your skin. The reservoir is the part where a needle is inserted to give medicines or draw blood.  Catheter. The catheter is a thin, flexible tube that extends from the reservoir. The catheter is placed into a large vein. Medicine that is inserted into the reservoir goes into the catheter and then into the vein.  How will I care for my incision site? Do not get the incision site wet. Bathe or shower as directed by your health care provider. How is my port accessed? Special steps must be taken to access the port:  Before the port is accessed, a numbing cream can be placed on the skin. This helps numb the skin over the port site.  Your health care provider uses a sterile technique to access the port. ? Your health care provider must put on a mask and sterile gloves. ? The skin over your port is cleaned carefully with an antiseptic and allowed to dry. ? The port is gently pinched between sterile gloves, and a needle is inserted into the port.  Only "non-coring" port needles should be used to access the port. Once the port is accessed, a blood return should be checked. This helps ensure that the port  is in the vein and is not clogged.  If your port needs to remain accessed for a constant infusion, a clear (transparent) bandage will be placed over the needle site. The bandage and needle will need to be changed every week, or as directed by your health care provider.  Keep the bandage covering the needle clean and dry. Do not get it wet. Follow your health care provider's instructions on how to take a shower or bath while the port is accessed.  If your port does not need to stay accessed, no bandage is needed over the port.  What is flushing? Flushing helps keep the port from getting clogged. Follow your health care provider's instructions on how and when to flush the port. Ports are usually flushed with saline solution or a medicine called heparin. The need for flushing will depend on how the port is used.  If the port is used for intermittent medicines or blood draws, the port will need to be flushed: ? After medicines have been given. ? After blood has been drawn. ? As part of routine maintenance.  If a constant infusion is running, the port may not need to be flushed.  How long will my port stay implanted? The port can stay in for as long as your health care provider thinks it is needed. When it is time for the port to come out, surgery will be   done to remove it. The procedure is similar to the one performed when the port was put in. When should I seek immediate medical care? When you have an implanted port, you should seek immediate medical care if:  You notice a bad smell coming from the incision site.  You have swelling, redness, or drainage at the incision site.  You have more swelling or pain at the port site or the surrounding area.  You have a fever that is not controlled with medicine.  This information is not intended to replace advice given to you by your health care provider. Make sure you discuss any questions you have with your health care provider. Document  Released: 08/31/2005 Document Revised: 02/06/2016 Document Reviewed: 05/08/2013 Elsevier Interactive Patient Education  2017 [employer]  

## 2018-06-23 ENCOUNTER — Telehealth: Payer: Self-pay | Admitting: Hematology and Oncology

## 2018-06-23 ENCOUNTER — Ambulatory Visit
Admission: RE | Admit: 2018-06-23 | Discharge: 2018-06-23 | Disposition: A | Discharge status: Home / Self Care | Payer: Medicaid Other | Source: Ambulatory Visit | Attending: Radiation Oncology | Admitting: Radiation Oncology

## 2018-06-23 ENCOUNTER — Encounter: Payer: Self-pay | Admitting: Hematology and Oncology

## 2018-06-23 ENCOUNTER — Inpatient Hospital Stay: Payer: Medicaid Other

## 2018-06-23 ENCOUNTER — Inpatient Hospital Stay (HOSPITAL_BASED_OUTPATIENT_CLINIC_OR_DEPARTMENT_OTHER): Payer: Medicaid Other | Admitting: Hematology and Oncology

## 2018-06-23 ENCOUNTER — Other Ambulatory Visit: Payer: Self-pay | Admitting: Hematology and Oncology

## 2018-06-23 VITALS — BP 118/59 | HR 74 | Temp 97.5°F | Resp 18

## 2018-06-23 DIAGNOSIS — F329 Major depressive disorder, single episode, unspecified: Secondary | ICD-10-CM

## 2018-06-23 DIAGNOSIS — Z79899 Other long term (current) drug therapy: Secondary | ICD-10-CM

## 2018-06-23 DIAGNOSIS — D61818 Other pancytopenia: Secondary | ICD-10-CM | POA: Diagnosis not present

## 2018-06-23 DIAGNOSIS — C539 Malignant neoplasm of cervix uteri, unspecified: Secondary | ICD-10-CM

## 2018-06-23 DIAGNOSIS — E876 Hypokalemia: Secondary | ICD-10-CM

## 2018-06-23 DIAGNOSIS — Z5111 Encounter for antineoplastic chemotherapy: Secondary | ICD-10-CM

## 2018-06-23 DIAGNOSIS — F321 Major depressive disorder, single episode, moderate: Secondary | ICD-10-CM

## 2018-06-23 DIAGNOSIS — R64 Cachexia: Secondary | ICD-10-CM

## 2018-06-23 DIAGNOSIS — Z510 Encounter for antineoplastic radiation therapy: Secondary | ICD-10-CM | POA: Diagnosis not present

## 2018-06-23 DIAGNOSIS — R634 Abnormal weight loss: Secondary | ICD-10-CM

## 2018-06-23 MED ORDER — SODIUM CHLORIDE 0.9 % IV SOLN
Freq: Once | INTRAVENOUS | Status: DC
  Filled 2018-06-23: qty 250

## 2018-06-23 MED ORDER — HEPARIN SOD (PORK) LOCK FLUSH 100 UNIT/ML IV SOLN
500.0000 [IU] | Freq: Once | INTRAVENOUS | Status: AC
  Administered 2018-06-23: 500 [IU]
  Filled 2018-06-23: qty 5

## 2018-06-23 MED ORDER — SODIUM CHLORIDE 0.9 % IV SOLN
Freq: Once | INTRAVENOUS | Status: AC
  Administered 2018-06-23: 14:00:00 via INTRAVENOUS
  Filled 2018-06-23: qty 250

## 2018-06-23 MED ORDER — SODIUM CHLORIDE 0.9% FLUSH
10.0000 mL | Freq: Once | INTRAVENOUS | Status: AC
  Administered 2018-06-23: 10 mL
  Filled 2018-06-23: qty 10

## 2018-06-23 NOTE — Assessment & Plan Note (Signed)
She denies significant recurrent depression or suicidal ideation.  She did not tolerate antidepressant well.  She will continue follow-up with behavioral health in the future.

## 2018-06-23 NOTE — Patient Instructions (Signed)
Dehydration, Adult Dehydration is when there is not enough fluid or water in your body. This happens when you lose more fluids than you take in. Dehydration can range from mild to very bad. It should be treated right away to keep it from getting very bad. Symptoms of mild dehydration may include:  Thirst.  Dry lips.  Slightly dry mouth.  Dry, warm skin.  Dizziness. Symptoms of moderate dehydration may include:  Very dry mouth.  Muscle cramps.  Dark pee (urine). Pee may be the color of tea.  Your body making less pee.  Your eyes making fewer tears.  Heartbeat that is uneven or faster than normal (palpitations).  Headache.  Light-headedness, especially when you stand up from sitting.  Fainting (syncope). Symptoms of very bad dehydration may include:  Changes in skin, such as: ? Cold and clammy skin. ? Blotchy (mottled) or pale skin. ? Skin that does not quickly return to normal after being lightly pinched and let go (poor skin turgor).  Changes in body fluids, such as: ? Feeling very thirsty. ? Your eyes making fewer tears. ? Not sweating when body temperature is high, such as in hot weather. ? Your body making very little pee.  Changes in vital signs, such as: ? Weak pulse. ? Pulse that is more than 100 beats a minute when you are sitting still. ? Fast breathing. ? Low blood pressure.  Other changes, such as: ? Sunken eyes. ? Cold hands and feet. ? Confusion. ? Lack of energy (lethargy). ? Trouble waking up from sleep. ? Short-term weight loss. ? Unconsciousness. Follow these instructions at home:  If told by your doctor, drink an ORS: ? Make an ORS by using instructions on the package. ? Start by drinking small amounts, about  cup (120 mL) every 5-10 minutes. ? Slowly drink more until you have had the amount that your doctor said to have.  Drink enough clear fluid to keep your pee clear or pale yellow. If you were told to drink an ORS, finish the ORS  first, then start slowly drinking clear fluids. Drink fluids such as: ? Water. Do not drink only water by itself. Doing that can make the salt (sodium) level in your body get too low (hyponatremia). ? Ice chips. ? Fruit juice that you have added water to (diluted). ? Low-calorie sports drinks.  Avoid: ? Alcohol. ? Drinks that have a lot of sugar. These include high-calorie sports drinks, fruit juice that does not have water added, and soda. ? Caffeine. ? Foods that are greasy or have a lot of fat or sugar.  Take over-the-counter and prescription medicines only as told by your doctor.  Do not take salt tablets. Doing that can make the salt level in your body get too high (hypernatremia).  Eat foods that have minerals (electrolytes). Examples include bananas, oranges, potatoes, tomatoes, and spinach.  Keep all follow-up visits as told by your doctor. This is important. Contact a doctor if:  You have belly (abdominal) pain that: ? Gets worse. ? Stays in one area (localizes).  You have a rash.  You have a stiff neck.  You get angry or annoyed more easily than normal (irritability).  You are more sleepy than normal.  You have a harder time waking up than normal.  You feel: ? Weak. ? Dizzy. ? Very thirsty.  You have peed (urinated) only a small amount of very dark pee during 6-8 hours. Get help right away if:  You have symptoms of   very bad dehydration.  You cannot drink fluids without throwing up (vomiting).  Your symptoms get worse with treatment.  You have a fever.  You have a very bad headache.  You are throwing up or having watery poop (diarrhea) and it: ? Gets worse. ? Does not go away.  You have blood or something green (bile) in your throw-up.  You have blood in your poop (stool). This may cause poop to look black and tarry.  You have not peed in 6-8 hours.  You pass out (faint).  Your heart rate when you are sitting still is more than 100 beats a  minute.  You have trouble breathing. This information is not intended to replace advice given to you by your health care provider. Make sure you discuss any questions you have with your health care provider. Document Released: 06/27/2009 Document Revised: 03/20/2016 Document Reviewed: 10/25/2015 Elsevier Interactive Patient Education  2018 Elsevier Inc.  

## 2018-06-23 NOTE — Assessment & Plan Note (Signed)
She appears to tolerate reduced dose chemotherapy better We will continue treatment as schedule, last dose of chemotherapy tomorrow She will continue IV fluid hydration therapy today, this Saturday and next week If her repeat blood work next week is satisfactory, I will schedule lab/flush follow-up every 6 to 8 weeks until PET CT scan to be done around end of February, 3 months away from radiation therapy She will need follow-up with GYN oncologist after she finished radiation treatment for further follow-up

## 2018-06-23 NOTE — Assessment & Plan Note (Signed)
She has mild persistent anemia, stable She will continue reduced dose treatment I plan to recheck iron studies again in her next blood draw next month

## 2018-06-23 NOTE — Progress Notes (Signed)
TC to Dr. Alvy Bimler inquiring about fluids with potassium 66meq, Pt.'s potassium today was 3.6. Per Dr. Alvy Bimler give Pt. 500 ml of normal saline over 1 hour.

## 2018-06-23 NOTE — Progress Notes (Signed)
[facility] OFFICE PROGRESS NOTE  Patient Care Team: Patient, [name] as PCP - General (General Practice)  ASSESSMENT & PLAN:  Malignant neoplasm of cervix ([place]) She appears to tolerate reduced dose chemotherapy better We will continue treatment as schedule, last dose of chemotherapy tomorrow She will continue IV fluid hydration therapy today, this Saturday and next week If her repeat blood work next week is satisfactory, I will schedule lab/flush follow-up every 6 to 8 weeks until PET CT scan to be done around end of February, 3 months away from radiation therapy She will need follow-up with GYN oncologist after she finished radiation treatment for further follow-up  Acquired pancytopenia ([place]) She has mild persistent anemia, stable She will continue reduced dose treatment I plan to recheck iron studies again in her next blood draw next month  Cachexia [facility]) She has significant cachexia According to the caregiver, the patient is now eating better I recommend her to take dexamethasone as appetite stimulant and to continue on nutritional supplement as tolerated  Major depressive disorder, single episode, moderate ([place]) She denies significant recurrent depression or suicidal ideation.  She did not tolerate antidepressant well.  She will continue follow-up with behavioral health in the future.   No orders of the defined types were placed in this encounter.   INTERVAL HISTORY: Please see below for problem oriented charting. She returns with her caregiver for further follow-up She feels better this week No recent infection, fever or chills No neuropathy Denies nausea She continues to have intermittent diarrhea, stable Denies pain Denies depression or suicidal ideation Leg swelling has resolved  SUMMARY OF ONCOLOGIC HISTORY:   Malignant neoplasm of cervix ([place])   03/13/2018 Imaging    US pelvis There are mobile echogenic foci within the endometrial cavity  suggestive of gas. This is nonspecific in etiology however may be secondary to endometritis. Correlate for history of recent instrumentation. This obscures visualization of the endometrial tissue and therefore a follow-up pelvic ultrasound in 2-4 weeks is recommended after resolution of the acute symptomatology if symptoms persist to further evaluate the endometrium.     03/13/2018 Initial Diagnosis    She initially presented with PMB    04/12/2018 Pathology Results    Cervix, biopsy, mass - INVASIVE SQUAMOUS CELL CARCINOMA - SEE COMMENT    04/12/2018 Surgery    PREOPERATIVE DIAGNOSIS:  Postmenopausal vaginal bleeding. POSTOPERATIVE DIAGNOSIS: The same PROCEDURE: Exam under anesthesia, pap smear, cervical mass biopsy SURGEON:  Dr. [name]  INDICATIONS: 67 y.o. yo [id number] with PMB here for exam under anesthesia.Risks of surgery were discussed with the patient including but not limited to: bleeding which may require transfusion; infection which may require antibiotics; injury to uterus or surrounding organs; need for additional procedures including laparotomy or laparoscopy; and other postoperative/anesthesia complications. Written informed consent was obtained.    FINDINGS:  An 8-week size midline uterus.  No adnexal mass palpable on exam. Normal cervix not visualized. Friable mass seen in involving the vagina at the level of the cervix obliterating the anterior and posterior fornix.  ANESTHESIA:   General INTRAVENOUS FLUIDS:  300 ml of LR ESTIMATED BLOOD LOSS: 20 ml. SPECIMENS: pap smear, cervical mass biopsy COMPLICATIONS:  None immediate.     04/25/2018 PET scan    1. Large cervical mass may invade into the myometrium in down into the vagina, maximum SUV 21.3. There is a malignant left external iliac node measuring 1.4 cm in short axis with maximum SUV 14.1. 2. Accentuated activity  in the cecum and ascending colon is likely physiologic given that it has no CT correlate.  Correlation with the patient's colon cancer screening history is recommended. If screening is not up-to-date, appropriate screening should be considered. 3.  Aortic Atherosclerosis (ICD10-I70.0).     04/28/2018 Surgery    Pre-operative Diagnosis:  At least Stage 2B SCCa Cervical Cancer  Post-operative Diagnosis: At least clinical Stage 3A SCCa Cervical Cancer  Operation:  Exam under anesthesia due to intolerance for vaginal exam in office Cystoscopy due to concern for extension to posterior bladder (from anterior vaginal wall lesion)  Operative Findings:   1. Parametrial involvement on right  2. Right sided subvaginal tumor burden ~3cm. This approximates the right ureteral insertion into the bladder based on my exam during cystoscopy. 3. Extension of disease down upper half of vagina on lateral and posterior walls. 4. Extension of disease down to lower third of anterior vagina (versus skip lesion) thus making her Stage 3A 5. Cystoscopy revealed patent bilateral ureteral orifices and no bladder mucosa invasion or areas of concern. 6. Rectal exam grossly no disease.     05/04/2018 Cancer Staging    Staging form: Cervix Uteri, AJCC 8th Edition - Clinical: Stage IIIB (cT3b, cN1, cM0) - Signed by [name], MD on 05/04/2018    05/12/2018 Procedure    Successful 8 French right internal jugular vein power port placement with its tip at the SVC/RA junction    05/13/2018 -  Chemotherapy    The patient had weekly cisplatin given with concurrent radiation     06/03/2018 Adverse Reaction    She missed her chemo due to severe depression and was sent to the ER for suicidal ideation.  She was seen by psychiatrist.     REVIEW OF SYSTEMS:   Constitutional: Denies fevers, chills or abnormal weight loss Eyes: Denies blurriness of vision Ears, nose, mouth, throat, and face: Denies mucositis or sore throat Respiratory: Denies cough, dyspnea or wheezes Cardiovascular: Denies palpitation, chest  discomfort or lower extremity swelling Skin: Denies abnormal skin rashes Lymphatics: Denies new lymphadenopathy or easy bruising Neurological:Denies numbness, tingling or new weaknesses Behavioral/Psych: Mood is stable, no new changes  All other systems were reviewed with the patient and are negative.  I have reviewed the past medical history, past surgical history, social history and family history with the patient and they are unchanged from previous note.  ALLERGIES:  has No Known Allergies.  MEDICATIONS:  Current Outpatient Medications  Medication Sig Dispense Refill  . dexamethasone (DECADRON) 4 MG tablet Take 1 tablet (4 mg total) by mouth daily. 30 tablet 0  . hydrOXYzine (ATARAX/VISTARIL) 25 MG tablet Take 25 mg by mouth 3 (three) times daily as needed for anxiety.    . lidocaine-prilocaine (EMLA) cream Apply to affected area once (Patient taking differently: Apply 1 application topically as needed (for port acces). ) 30 g 3  . ondansetron (ZOFRAN) 8 MG tablet Take 1 tablet (8 mg total) by mouth every 8 (eight) hours as needed. Start on the third day after chemotherapy. (Patient not taking: Reported on 06/10/2018) 30 tablet 1  . oxyCODONE-acetaminophen (PERCOCET/ROXICET) 5-325 MG tablet Take 1-2 tablets by mouth every 6 (six) hours as needed. (Patient not taking: Reported on 05/04/2018) 20 tablet 0  . prochlorperazine (COMPAZINE) 10 MG tablet Take 1 tablet (10 mg total) by mouth every 6 (six) hours as needed (Nausea or vomiting). (Patient not taking: Reported on 06/03/2018) 30 tablet 1  . sertraline (ZOLOFT) 25 MG tablet Take 1  tablet (25 mg total) by mouth daily. 30 tablet 0  . traZODone (DESYREL) 50 MG tablet Take 1 tablet (50 mg total) by mouth at bedtime. 30 tablet 0   No current facility-administered medications for this visit.    Facility-Administered Medications Ordered in Other Visits  Medication Dose Route Frequency Provider Last Rate Last Dose  . 0.9 %  sodium chloride  infusion   Intravenous Once Alvy Bimler, Manvir Prabhu, MD      . sodium chloride 0.9 % 1,000 mL with potassium chloride 20 mEq infusion   Intravenous Continuous [name], MD   Stopped at 06/09/18 1406    PHYSICAL EXAMINATION: ECOG PERFORMANCE STATUS: 1 - Symptomatic but completely ambulatory  Vitals:   06/23/18 1232  BP: 104/63  Pulse: 74  Resp: 18  Temp: (!) 97.5 F (36.4 C)  SpO2: 100%   Filed Weights   06/23/18 1232  Weight: 80 lb 12.8 oz (36.7 kg)    GENERAL:alert, no distress and comfortable SKIN: skin color, texture, turgor are normal, no rashes or significant lesions EYES: normal, Conjunctiva are pink and non-injected, sclera clear OROPHARYNX:no exudate, no erythema and lips, buccal mucosa, and tongue normal  NECK: supple, thyroid normal size, non-tender, without nodularity LYMPH:  no palpable lymphadenopathy in the cervical, axillary or inguinal LUNGS: clear to auscultation and percussion with normal breathing effort HEART: regular rate & rhythm and no murmurs and no lower extremity edema ABDOMEN:abdomen soft, non-tender and normal bowel sounds Musculoskeletal:no cyanosis of digits and no clubbing  NEURO: alert & oriented x 3 with fluent speech, no focal motor/sensory deficits  LABORATORY DATA:  I have reviewed the data as listed    Component Value Date/Time   NA 139 06/22/2018 1109   K 3.6 06/22/2018 1109   CL 100 06/22/2018 1109   CO2 30 06/22/2018 1109   GLUCOSE 86 06/22/2018 1109   BUN 13 06/22/2018 1109   CREATININE 0.82 06/22/2018 1109   CALCIUM 8.8 (L) 06/22/2018 1109   PROT 6.1 (L) 06/22/2018 1109   ALBUMIN 3.4 (L) 06/22/2018 1109   AST 12 (L) 06/22/2018 1109   ALT 11 06/22/2018 1109   ALKPHOS 60 06/22/2018 1109   BILITOT 0.2 (L) 06/22/2018 1109   GFRNONAA >60 06/22/2018 1109   GFRAA >60 06/22/2018 1109    No results found for: SPEP, UPEP  Lab Results  Component Value Date   WBC 4.1 06/22/2018   NEUTROABS 3.5 06/22/2018   HGB 10.0 (L) 06/22/2018    HCT 31.6 (L) 06/22/2018   MCV 84.0 06/22/2018   PLT 181 06/22/2018      Chemistry      Component Value Date/Time   NA 139 06/22/2018 1109   K 3.6 06/22/2018 1109   CL 100 06/22/2018 1109   CO2 30 06/22/2018 1109   BUN 13 06/22/2018 1109   CREATININE 0.82 06/22/2018 1109      Component Value Date/Time   CALCIUM 8.8 (L) 06/22/2018 1109   ALKPHOS 60 06/22/2018 1109   AST 12 (L) 06/22/2018 1109   ALT 11 06/22/2018 1109   BILITOT 0.2 (L) 06/22/2018 1109      All questions were answered. The patient knows to call the clinic with any problems, questions or concerns. No barriers to learning was detected.  I spent 15 minutes counseling the patient face to face. The total time spent in the appointment was 20 minutes and more than 50% was on counseling and review of test results  [name], MD 06/23/2018 1:25 PM

## 2018-06-23 NOTE — Assessment & Plan Note (Signed)
She has significant cachexia According to the caregiver, the patient is now eating better I recommend her to take dexamethasone as appetite stimulant and to continue on nutritional supplement as tolerated

## 2018-06-23 NOTE — Telephone Encounter (Signed)
Gave patient avs and calendar.   °

## 2018-06-24 ENCOUNTER — Ambulatory Visit
Admission: RE | Admit: 2018-06-24 | Discharge: 2018-06-24 | Disposition: A | Discharge status: Home / Self Care | Payer: Medicaid Other | Source: Ambulatory Visit | Attending: Radiation Oncology | Admitting: Radiation Oncology

## 2018-06-24 ENCOUNTER — Ambulatory Visit: Payer: Self-pay

## 2018-06-24 ENCOUNTER — Inpatient Hospital Stay: Payer: Medicaid Other

## 2018-06-24 VITALS — BP 109/76 | HR 73 | Temp 98.0°F | Resp 18

## 2018-06-24 DIAGNOSIS — Z510 Encounter for antineoplastic radiation therapy: Secondary | ICD-10-CM | POA: Diagnosis not present

## 2018-06-24 DIAGNOSIS — C539 Malignant neoplasm of cervix uteri, unspecified: Secondary | ICD-10-CM

## 2018-06-24 DIAGNOSIS — Z5111 Encounter for antineoplastic chemotherapy: Secondary | ICD-10-CM | POA: Diagnosis not present

## 2018-06-24 MED ORDER — SODIUM CHLORIDE 0.9 % IV SOLN
Freq: Once | INTRAVENOUS | Status: AC
  Administered 2018-06-24: 09:00:00 via INTRAVENOUS
  Filled 2018-06-24: qty 250

## 2018-06-24 MED ORDER — HEPARIN SOD (PORK) LOCK FLUSH 100 UNIT/ML IV SOLN
500.0000 [IU] | Freq: Once | INTRAVENOUS | Status: AC | PRN
  Administered 2018-06-24: 500 [IU]
  Filled 2018-06-24: qty 5

## 2018-06-24 MED ORDER — POTASSIUM CHLORIDE 2 MEQ/ML IV SOLN
Freq: Once | INTRAVENOUS | Status: AC
  Administered 2018-06-24: 09:00:00 via INTRAVENOUS
  Filled 2018-06-24: qty 10

## 2018-06-24 MED ORDER — PALONOSETRON HCL INJECTION 0.25 MG/5ML
INTRAVENOUS | Status: AC
  Filled 2018-06-24: qty 5

## 2018-06-24 MED ORDER — PALONOSETRON HCL INJECTION 0.25 MG/5ML
0.2500 mg | Freq: Once | INTRAVENOUS | Status: AC
  Administered 2018-06-24: 0.25 mg via INTRAVENOUS

## 2018-06-24 MED ORDER — SODIUM CHLORIDE 0.9% FLUSH
10.0000 mL | INTRAVENOUS | Status: DC | PRN
  Administered 2018-06-24: 10 mL
  Filled 2018-06-24: qty 10

## 2018-06-24 MED ORDER — SODIUM CHLORIDE 0.9 % IV SOLN
Freq: Once | INTRAVENOUS | Status: AC
  Administered 2018-06-24: 12:00:00 via INTRAVENOUS
  Filled 2018-06-24: qty 5

## 2018-06-24 MED ORDER — SODIUM CHLORIDE 0.9 % IV SOLN
36.0000 mg/m2 | Freq: Once | INTRAVENOUS | Status: AC
  Administered 2018-06-24: 46 mg via INTRAVENOUS
  Filled 2018-06-24: qty 46

## 2018-06-24 NOTE — Progress Notes (Signed)
Ok to treat with urine output at only 126mL. Please continue to monitor and document output.

## 2018-06-24 NOTE — Patient Instructions (Signed)
[facility] Discharge Instructions for Patients Receiving Chemotherapy  Today you received the following chemotherapy agents Cisplatin (Platinol).  To help prevent nausea and vomiting after your treatment, we encourage you to take your nausea medication as prescribed.   If you develop nausea and vomiting that is not controlled by your nausea medication, call the clinic.   BELOW ARE SYMPTOMS THAT SHOULD BE REPORTED IMMEDIATELY:  *FEVER GREATER THAN 100.5 F  *CHILLS WITH OR WITHOUT FEVER  NAUSEA AND VOMITING THAT IS NOT CONTROLLED WITH YOUR NAUSEA MEDICATION  *UNUSUAL SHORTNESS OF BREATH  *UNUSUAL BRUISING OR BLEEDING  TENDERNESS IN MOUTH AND THROAT WITH OR WITHOUT PRESENCE OF ULCERS  *URINARY PROBLEMS  *BOWEL PROBLEMS  UNUSUAL RASH Items with * indicate a potential emergency and should be followed up as soon as possible.  Feel free to call the clinic should you have any questions or concerns. The clinic phone number is [phone].  Please show the [facility] at check-in to the Emergency Department and triage nurse.

## 2018-06-25 ENCOUNTER — Inpatient Hospital Stay: Payer: Medicaid Other

## 2018-06-25 VITALS — BP 117/64 | HR 71 | Temp 97.9°F | Resp 17

## 2018-06-25 DIAGNOSIS — C539 Malignant neoplasm of cervix uteri, unspecified: Secondary | ICD-10-CM

## 2018-06-25 DIAGNOSIS — Z5111 Encounter for antineoplastic chemotherapy: Secondary | ICD-10-CM | POA: Diagnosis not present

## 2018-06-25 MED ORDER — HEPARIN SOD (PORK) LOCK FLUSH 100 UNIT/ML IV SOLN
250.0000 [IU] | Freq: Once | INTRAVENOUS | Status: DC
  Filled 2018-06-25: qty 5

## 2018-06-25 MED ORDER — HEPARIN SOD (PORK) LOCK FLUSH 100 UNIT/ML IV SOLN
500.0000 [IU] | Freq: Once | INTRAVENOUS | Status: AC
  Administered 2018-06-25: 500 [IU]
  Filled 2018-06-25: qty 5

## 2018-06-25 MED ORDER — SODIUM CHLORIDE 0.9% FLUSH
10.0000 mL | Freq: Once | INTRAVENOUS | Status: AC
  Administered 2018-06-25: 10 mL
  Filled 2018-06-25: qty 10

## 2018-06-25 MED ORDER — SODIUM CHLORIDE 0.9 % IV SOLN
Freq: Once | INTRAVENOUS | Status: AC
  Administered 2018-06-25: 10:00:00 via INTRAVENOUS
  Filled 2018-06-25: qty 250

## 2018-06-25 NOTE — Patient Instructions (Addendum)
Dehydration, Adult Dehydration is when there is not enough fluid or water in your body. This happens when you lose more fluids than you take in. Dehydration can range from mild to very bad. It should be treated right away to keep it from getting very bad. Symptoms of mild dehydration may include:  Thirst.  Dry lips.  Slightly dry mouth.  Dry, warm skin.  Dizziness. Symptoms of moderate dehydration may include:  Very dry mouth.  Muscle cramps.  Dark pee (urine). Pee may be the color of tea.  Your body making less pee.  Your eyes making fewer tears.  Heartbeat that is uneven or faster than normal (palpitations).  Headache.  Light-headedness, especially when you stand up from sitting.  Fainting (syncope). Symptoms of very bad dehydration may include:  Changes in skin, such as: ? Cold and clammy skin. ? Blotchy (mottled) or pale skin. ? Skin that does not quickly return to normal after being lightly pinched and let go (poor skin turgor).  Changes in body fluids, such as: ? Feeling very thirsty. ? Your eyes making fewer tears. ? Not sweating when body temperature is high, such as in hot weather. ? Your body making very little pee.  Changes in vital signs, such as: ? Weak pulse. ? Pulse that is more than 100 beats a minute when you are sitting still. ? Fast breathing. ? Low blood pressure.  Other changes, such as: ? Sunken eyes. ? Cold hands and feet. ? Confusion. ? Lack of energy (lethargy). ? Trouble waking up from sleep. ? Short-term weight loss. ? Unconsciousness. Follow these instructions at home:  If told by your doctor, drink an ORS: ? Make an ORS by using instructions on the package. ? Start by drinking small amounts, about  cup (120 mL) every 5-10 minutes. ? Slowly drink more until you have had the amount that your doctor said to have.  Drink enough clear fluid to keep your pee clear or pale yellow. If you were told to drink an ORS, finish the ORS  first, then start slowly drinking clear fluids. Drink fluids such as: ? Water. Do not drink only water by itself. Doing that can make the salt (sodium) level in your body get too low (hyponatremia). ? Ice chips. ? Fruit juice that you have added water to (diluted). ? Low-calorie sports drinks.  Avoid: ? Alcohol. ? Drinks that have a lot of sugar. These include high-calorie sports drinks, fruit juice that does not have water added, and soda. ? Caffeine. ? Foods that are greasy or have a lot of fat or sugar.  Take over-the-counter and prescription medicines only as told by your doctor.  Do not take salt tablets. Doing that can make the salt level in your body get too high (hypernatremia).  Eat foods that have minerals (electrolytes). Examples include bananas, oranges, potatoes, tomatoes, and spinach.  Keep all follow-up visits as told by your doctor. This is important. Contact a doctor if:  You have belly (abdominal) pain that: ? Gets worse. ? Stays in one area (localizes).  You have a rash.  You have a stiff neck.  You get angry or annoyed more easily than normal (irritability).  You are more sleepy than normal.  You have a harder time waking up than normal.  You feel: ? Weak. ? Dizzy. ? Very thirsty.  You have peed (urinated) only a small amount of very dark pee during 6-8 hours. Get help right away if:  You have symptoms of   very bad dehydration.  You cannot drink fluids without throwing up (vomiting).  Your symptoms get worse with treatment.  You have a fever.  You have a very bad headache.  You are throwing up or having watery poop (diarrhea) and it: ? Gets worse. ? Does not go away.  You have blood or something green (bile) in your throw-up.  You have blood in your poop (stool). This may cause poop to look black and tarry.  You have not peed in 6-8 hours.  You pass out (faint).  Your heart rate when you are sitting still is more than 100 beats a  minute.  You have trouble breathing. This information is not intended to replace advice given to you by your health care provider. Make sure you discuss any questions you have with your health care provider. Document Released: 06/27/2009 Document Revised: 03/20/2016 Document Reviewed: 10/25/2015 Elsevier Interactive Patient Education  2018 [employer]  Dehydration, Adult Dehydration is when there is not enough fluid or water in your body. This happens when you lose more fluids than you take in. Dehydration can range from mild to very bad. It should be treated right away to keep it from getting very bad. Symptoms of mild dehydration may include:  Thirst.  Dry lips.  Slightly dry mouth.  Dry, warm skin.  Dizziness. Symptoms of moderate dehydration may include:  Very dry mouth.  Muscle cramps.  Dark pee (urine). Pee may be the color of tea.  Your body making less pee.  Your eyes making fewer tears.  Heartbeat that is uneven or faster than normal (palpitations).  Headache.  Light-headedness, especially when you stand up from sitting.  Fainting (syncope). Symptoms of very bad dehydration may include:  Changes in skin, such as: ? Cold and clammy skin. ? Blotchy (mottled) or pale skin. ? Skin that does not quickly return to normal after being lightly pinched and let go (poor skin turgor).  Changes in body fluids, such as: ? Feeling very thirsty. ? Your eyes making fewer tears. ? Not sweating when body temperature is high, such as in hot weather. ? Your body making very little pee.  Changes in vital signs, such as: ? Weak pulse. ? Pulse that is more than 100 beats a minute when you are sitting still. ? Fast breathing. ? Low blood pressure.  Other changes, such as: ? Sunken eyes. ? Cold hands and feet. ? Confusion. ? Lack of energy (lethargy). ? Trouble waking up from sleep. ? Short-term weight loss. ? Unconsciousness. Follow these instructions at  home:  If told by your doctor, drink an ORS: ? Make an ORS by using instructions on the package. ? Start by drinking small amounts, about  cup (120 mL) every 5-10 minutes. ? Slowly drink more until you have had the amount that your doctor said to have.  Drink enough clear fluid to keep your pee clear or pale yellow. If you were told to drink an ORS, finish the ORS first, then start slowly drinking clear fluids. Drink fluids such as: ? Water. Do not drink only water by itself. Doing that can make the salt (sodium) level in your body get too low (hyponatremia). ? Ice chips. ? Fruit juice that you have added water to (diluted). ? Low-calorie sports drinks.  Avoid: ? Alcohol. ? Drinks that have a lot of sugar. These include high-calorie sports drinks, fruit juice that does not have water added, and soda. ? Caffeine. ? Foods that are greasy or have   a lot of fat or sugar.  Take over-the-counter and prescription medicines only as told by your doctor.  Do not take salt tablets. Doing that can make the salt level in your body get too high (hypernatremia).  Eat foods that have minerals (electrolytes). Examples include bananas, oranges, potatoes, tomatoes, and spinach.  Keep all follow-up visits as told by your doctor. This is important. Contact a doctor if:  You have belly (abdominal) pain that: ? Gets worse. ? Stays in one area (localizes).  You have a rash.  You have a stiff neck.  You get angry or annoyed more easily than normal (irritability).  You are more sleepy than normal.  You have a harder time waking up than normal.  You feel: ? Weak. ? Dizzy. ? Very thirsty.  You have peed (urinated) only a small amount of very dark pee during 6-8 hours. Get help right away if:  You have symptoms of very bad dehydration.  You cannot drink fluids without throwing up (vomiting).  Your symptoms get worse with treatment.  You have a fever.  You have a very bad headache.  You  are throwing up or having watery poop (diarrhea) and it: ? Gets worse. ? Does not go away.  You have blood or something green (bile) in your throw-up.  You have blood in your poop (stool). This may cause poop to look black and tarry.  You have not peed in 6-8 hours.  You pass out (faint).  Your heart rate when you are sitting still is more than 100 beats a minute.  You have trouble breathing. This information is not intended to replace advice given to you by your health care provider. Make sure you discuss any questions you have with your health care provider. Document Released: 06/27/2009 Document Revised: 03/20/2016 Document Reviewed: 10/25/2015 Elsevier Interactive Patient Education  2018 [employer]  

## 2018-06-26 NOTE — H&P (View-Only) (Signed)
Radiation Oncology         [phone] ________________________________  History and physical examination  Name: Erin Kent MRN: [id number]  Date: 06/26/18  DOB: 18-Nov-1950    REFERRING PHYSICIAN: Precious Haws  DIAGNOSIS: FIGO Stage IIIC1, invasive squamous cell carcinoma to cervix (FIGO 2018 staging)   HISTORY OF PRESENT ILLNESS::Erin Kent is a 67 y.o. female who is presenting to the office today for evaluation of newly diagnosed cervical cancer. She is accompanied by her roommate, Erin Kent. She initially presented with vaginal bleeding and was evaluated in the Emergency Department on 03/13/2018. She notes that she had trace vaginal bleeding prior to this episode. She is currently having to use a pad to collect the drainage from her vagina. She denies back pain or abdominal pain. She has lost 10 pounds, however, she hasn't weighed herself in the past year. The patient has a lack of appetite and her roommate ensures that she takes a multivitamin, iron supplements, and ensure. 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 She lives in the [place] area.   She has underwent a routine pap smear on 04/12/2018 that showed: squamous cell carcinoma. On pathology it was noted: Cervix, biopsy, mass with invasive squamous cell carcinoma.   She then continued to a PET scan on 04/25/2018 that showed: Large cervical mass may invade into the myometrium in down into the vagina, maximum SUV 21.3. There is a malignant left external iliac node measuring 1.4 cm in short axis with maximum SUV 14.1. Accentuated activity in the cecum and ascending colon is likely physiologic given that it has no CT correlate. Correlation with the patient's colon cancer screening history is recommended. Aortic Atherosclerosis (ICD10-I70.0).  On review of systems, she reports mild vaginal discharge s/p recent procedure. she denies HA, double vision, and any other symptoms. She denies family hx of breast  or ovarian cancer. She doesn't have a PCP at this time, however, her roommate Erin Kent is in the process of obtaining a PCP for her.  The patient has completed most of her external beam radiation therapy and radiosensitizing chemotherapy. She now presents to the operating room for placement of brachytherapy equipment in preparation for her first high-dose-rate treatment.   PREVIOUS RADIATION THERAPY: No  PAST MEDICAL HISTORY:  has a past medical history of Aortic atherosclerosis ([place]), Cervical cancer ([place]), Depression, Diarrhea, Hypokalemia, Iron deficiency anemia, Osteopenia, Ovarian cyst, Pancytopenia ([place]) (2019), and Port-A-Cath in place.    PAST SURGICAL HISTORY: Past Surgical History:  Procedure Laterality Date  . CYSTOSCOPY N/A 04/28/2018   Procedure: CYSTOSCOPY;  Surgeon: Isabel Caprice, MD;  Location: [facility];  Service: Gynecology;  Laterality: N/A;  . EUA/ PAPSMEAR/  CERVICAL MASS BIOPSY  04-12-2018   dr peggy constant @[facility]   . IR IMAGING GUIDED PORT INSERTION  05/12/2018  . WISDOM TOOTH EXTRACTION      FAMILY HISTORY: family history includes Hypertension in her mother.  SOCIAL HISTORY:  reports that she has never smoked. She has never used smokeless tobacco. She reports that she does not drink alcohol or use drugs.  ALLERGIES: Patient has no known allergies.  MEDICATIONS:  No current facility-administered medications for this encounter.    Current Outpatient Medications  Medication Sig Dispense Refill  . dexamethasone (DECADRON) 4 MG tablet Take 1 tablet (4 mg total) by mouth daily. 30 tablet 0  . hydrOXYzine (ATARAX/VISTARIL) 25 MG tablet Take 25 mg by mouth 3 (three) times daily as needed for  anxiety.    . lidocaine-prilocaine (EMLA) cream Apply to affected area once (Patient taking differently: Apply 1 application topically as needed (for port acces). ) 30 g 3  . ondansetron (ZOFRAN) 8 MG tablet Take 1 tablet (8 mg total) by mouth every 8 (eight) hours as  needed. Start on the third day after chemotherapy. (Patient not taking: Reported on 06/10/2018) 30 tablet 1  . oxyCODONE-acetaminophen (PERCOCET/ROXICET) 5-325 MG tablet Take 1-2 tablets by mouth every 6 (six) hours as needed. (Patient not taking: Reported on 05/04/2018) 20 tablet 0  . prochlorperazine (COMPAZINE) 10 MG tablet Take 1 tablet (10 mg total) by mouth every 6 (six) hours as needed (Nausea or vomiting). (Patient not taking: Reported on 06/03/2018) 30 tablet 1  . sertraline (ZOLOFT) 25 MG tablet Take 1 tablet (25 mg total) by mouth daily. 30 tablet 0  . traZODone (DESYREL) 50 MG tablet Take 1 tablet (50 mg total) by mouth at bedtime. 30 tablet 0   Facility-Administered Medications Ordered in Other Encounters  Medication Dose Route Frequency Provider Last Rate Last Dose  . sodium chloride 0.9 % 1,000 mL with potassium chloride 20 mEq infusion   Intravenous Continuous [name], MD   Stopped at 06/09/18 1406    REVIEW OF SYSTEMS:  A 10+ POINT REVIEW OF SYSTEMS WAS OBTAINED including neurology, dermatology, psychiatry, cardiac, respiratory, lymph, extremities, GI, GU, musculoskeletal, constitutional, reproductive, HEENT. All pertinent positives are noted in the HPI. All others are negative.    PHYSICAL EXAM:  height is 5\' 1"  (1.549 m) and weight is 80 lb (36.3 kg).   General: Alert and oriented, in no acute distress. Emaciated appearance.  HEENT: Head is normocephalic. Extraocular movements are intact. Oropharynx is clear. Neck: Neck is supple, no palpable cervical or supraclavicular lymphadenopathy. Heart: Regular in rate and rhythm with no murmurs, rubs, or gallops. Chest: Clear to auscultation bilaterally, with no rhonchi, wheezes, or rales. Abdomen: Soft, nontender, nondistended, with no rigidity or guarding. Extremities: No cyanosis or edema. Lymphatics: see Neck Exam Skin: No concerning lesions. Musculoskeletal: symmetric strength and muscle tone throughout. Neurologic: Cranial  nerves II through XII are grossly intact. No obvious focalities. Speech is fluent. Coordination is intact. Psychiatric: Judgment and insight are intact. Affect is appropriate. Pelvic: The patient wasn't able to tolerate a pelvic exam due to pain. I was able to place an index finger in the vaginal vault and palpable tumor extended along the anterior vaginal wall to approximately the lower 1/3 of the vagina. No palpable inguinal adenopathy.   Exam under anesthesia by Dr. Gerarda Fraction revealed: Vulva: Normal external female genitalia.  Bladder/urethra: Urethral meatus normal in size and location. No lesions or masses, well supported bladder   Operative Findings:   1. Parametrial involvement on right  2. Right sided subvaginal tumor burden ~3cm. This approximates the right ureteral insertion into the bladder based on my exam during cystoscopy. 3. Extension of disease down upper half of vagina on lateral and posterior walls. 4. Extension of disease down to lower third of anterior vagina (versus skip lesion) thus making her Stage 3A 5. Cystoscopy revealed patent bilateral ureteral orifices and no bladder mucosa invasion or areas of concern. Rectal exam grossly no disease.    ECOG = 1  0 - Asymptomatic (Fully active, able to carry on all predisease activities without restriction)  1 - Symptomatic but completely ambulatory (Restricted in physically strenuous activity but ambulatory and able to carry out work of a light or sedentary nature. For example, light housework,  office work)  2 - Symptomatic, <50% in bed during the day (Ambulatory and capable of all self care but unable to carry out any work activities. Up and about more than 50% of waking hours)  3 - Symptomatic, >50% in bed, but not bedbound (Capable of only limited self-care, confined to bed or chair 50% or more of waking hours)  4 - Bedbound (Completely disabled. Cannot carry on any self-care. Totally confined to bed or chair)  5 - Death    Eustace Pen MM, Creech RH, Tormey DC, et al. [phone]). "Toxicity and response criteria of the [facility]". [place] Oncol. 5 (6): 649-55  LABORATORY DATA:  Lab Results  Component Value Date   WBC 4.1 06/22/2018   HGB 10.0 (L) 06/22/2018   HCT 31.6 (L) 06/22/2018   MCV 84.0 06/22/2018   PLT 181 06/22/2018   NEUTROABS 3.5 06/22/2018   Lab Results  Component Value Date   NA 139 06/22/2018   K 3.6 06/22/2018   CL 100 06/22/2018   CO2 30 06/22/2018   GLUCOSE 86 06/22/2018   CREATININE 0.82 06/22/2018   CALCIUM 8.8 (L) 06/22/2018      RADIOGRAPHY: No results found.    IMPRESSION: FIGO Stage IIIC1 Invasive squamous cell carcinoma to cervix.    Patient will be a good candidate for definitive course of radiation therapy along with radio sensitizing chemotherapy, radiation therapy would consist of 6 weeks external beam radiation therapy and 5 intracavitary brachytherapy treatments. patient is now ready to proceed with her brachytherapy treatments.    PLAN: she will be taken to the operating room on October 14 at 7:30 AM for exam under anesthesia and placement of brachytherapy equipment in preparation for high-dose rate radiation therapy using iridium 192 as the high-dose-rate source. Depending on the operative findings and the patient's anatomy she will have placement of a tandem ring apparatus or a tandem cylinder apparatus.    ------------------------------------------------  Blair Promise, PhD, MD      This document serves as a record of services personally performed by Gery Pray, MD. It was created on his behalf by Steva Colder, a trained medical scribe. The creation of this record is based on the scribe's personal observations and the provider's statements to them. This document has been checked and approved by the attending provider.

## 2018-06-26 NOTE — H&P (Signed)
Radiation Oncology         [phone] ________________________________  History and physical examination  Name: Erin Kent MRN: [id number]  Date: 06/26/18  DOB: 07/23/1951    REFERRING PHYSICIAN: Precious Haws  DIAGNOSIS: FIGO Stage IIIC1, invasive squamous cell carcinoma to cervix (FIGO 2018 staging)   HISTORY OF PRESENT ILLNESS::Erin Kent is a 67 y.o. female who is presenting to the office today for evaluation of newly diagnosed cervical cancer. She is accompanied by her roommate, Erin Kent. She initially presented with vaginal bleeding and was evaluated in the Emergency Department on 03/13/2018. She notes that she had trace vaginal bleeding prior to this episode. She is currently having to use a pad to collect the drainage from her vagina. She denies back pain or abdominal pain. She has lost 10 pounds, however, she hasn't weighed herself in the past year. The patient has a lack of appetite and her roommate ensures that she takes a multivitamin, iron supplements, and ensure. 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 She lives in the [place] area.   She has underwent a routine pap smear on 04/12/2018 that showed: squamous cell carcinoma. On pathology it was noted: Cervix, biopsy, mass with invasive squamous cell carcinoma.   She then continued to a PET scan on 04/25/2018 that showed: Large cervical mass may invade into the myometrium in down into the vagina, maximum SUV 21.3. There is a malignant left external iliac node measuring 1.4 cm in short axis with maximum SUV 14.1. Accentuated activity in the cecum and ascending colon is likely physiologic given that it has no CT correlate. Correlation with the patient's colon cancer screening history is recommended. Aortic Atherosclerosis (ICD10-I70.0).  On review of systems, she reports mild vaginal discharge s/p recent procedure. she denies HA, double vision, and any other symptoms. She denies family hx of breast  or ovarian cancer. She doesn't have a PCP at this time, however, her roommate Erin Kent is in the process of obtaining a PCP for her.  The patient has completed most of her external beam radiation therapy and radiosensitizing chemotherapy. She now presents to the operating room for placement of brachytherapy equipment in preparation for her first high-dose-rate treatment.   PREVIOUS RADIATION THERAPY: No  PAST MEDICAL HISTORY:  has a past medical history of Aortic atherosclerosis ([place]), Cervical cancer ([place]), Depression, Diarrhea, Hypokalemia, Iron deficiency anemia, Osteopenia, Ovarian cyst, Pancytopenia ([place]) (2019), and Port-A-Cath in place.    PAST SURGICAL HISTORY: Past Surgical History:  Procedure Laterality Date  . CYSTOSCOPY N/A 04/28/2018   Procedure: CYSTOSCOPY;  Surgeon: Isabel Caprice, MD;  Location: [facility];  Service: Gynecology;  Laterality: N/A;  . EUA/ PAPSMEAR/  CERVICAL MASS BIOPSY  04-12-2018   dr peggy constant @[facility]   . IR IMAGING GUIDED PORT INSERTION  05/12/2018  . WISDOM TOOTH EXTRACTION      FAMILY HISTORY: family history includes Hypertension in her mother.  SOCIAL HISTORY:  reports that she has never smoked. She has never used smokeless tobacco. She reports that she does not drink alcohol or use drugs.  ALLERGIES: Patient has no known allergies.  MEDICATIONS:  No current facility-administered medications for this encounter.    Current Outpatient Medications  Medication Sig Dispense Refill  . dexamethasone (DECADRON) 4 MG tablet Take 1 tablet (4 mg total) by mouth daily. 30 tablet 0  . hydrOXYzine (ATARAX/VISTARIL) 25 MG tablet Take 25 mg by mouth 3 (three) times daily as needed for  anxiety.    . lidocaine-prilocaine (EMLA) cream Apply to affected area once (Patient taking differently: Apply 1 application topically as needed (for port acces). ) 30 g 3  . ondansetron (ZOFRAN) 8 MG tablet Take 1 tablet (8 mg total) by mouth every 8 (eight) hours as  needed. Start on the third day after chemotherapy. (Patient not taking: Reported on 06/10/2018) 30 tablet 1  . oxyCODONE-acetaminophen (PERCOCET/ROXICET) 5-325 MG tablet Take 1-2 tablets by mouth every 6 (six) hours as needed. (Patient not taking: Reported on 05/04/2018) 20 tablet 0  . prochlorperazine (COMPAZINE) 10 MG tablet Take 1 tablet (10 mg total) by mouth every 6 (six) hours as needed (Nausea or vomiting). (Patient not taking: Reported on 06/03/2018) 30 tablet 1  . sertraline (ZOLOFT) 25 MG tablet Take 1 tablet (25 mg total) by mouth daily. 30 tablet 0  . traZODone (DESYREL) 50 MG tablet Take 1 tablet (50 mg total) by mouth at bedtime. 30 tablet 0   Facility-Administered Medications Ordered in Other Encounters  Medication Dose Route Frequency Provider Last Rate Last Dose  . sodium chloride 0.9 % 1,000 mL with potassium chloride 20 mEq infusion   Intravenous Continuous [name], MD   Stopped at 06/09/18 1406    REVIEW OF SYSTEMS:  A 10+ POINT REVIEW OF SYSTEMS WAS OBTAINED including neurology, dermatology, psychiatry, cardiac, respiratory, lymph, extremities, GI, GU, musculoskeletal, constitutional, reproductive, HEENT. All pertinent positives are noted in the HPI. All others are negative.    PHYSICAL EXAM:  height is 5\' 1"  (1.549 m) and weight is 80 lb (36.3 kg).   General: Alert and oriented, in no acute distress. Emaciated appearance.  HEENT: Head is normocephalic. Extraocular movements are intact. Oropharynx is clear. Neck: Neck is supple, no palpable cervical or supraclavicular lymphadenopathy. Heart: Regular in rate and rhythm with no murmurs, rubs, or gallops. Chest: Clear to auscultation bilaterally, with no rhonchi, wheezes, or rales. Abdomen: Soft, nontender, nondistended, with no rigidity or guarding. Extremities: No cyanosis or edema. Lymphatics: see Neck Exam Skin: No concerning lesions. Musculoskeletal: symmetric strength and muscle tone throughout. Neurologic: Cranial  nerves II through XII are grossly intact. No obvious focalities. Speech is fluent. Coordination is intact. Psychiatric: Judgment and insight are intact. Affect is appropriate. Pelvic: The patient wasn't able to tolerate a pelvic exam due to pain. I was able to place an index finger in the vaginal vault and palpable tumor extended along the anterior vaginal wall to approximately the lower 1/3 of the vagina. No palpable inguinal adenopathy.   Exam under anesthesia by Dr. Gerarda Fraction revealed: Vulva: Normal external female genitalia.  Bladder/urethra: Urethral meatus normal in size and location. No lesions or masses, well supported bladder   Operative Findings:   1. Parametrial involvement on right  2. Right sided subvaginal tumor burden ~3cm. This approximates the right ureteral insertion into the bladder based on my exam during cystoscopy. 3. Extension of disease down upper half of vagina on lateral and posterior walls. 4. Extension of disease down to lower third of anterior vagina (versus skip lesion) thus making her Stage 3A 5. Cystoscopy revealed patent bilateral ureteral orifices and no bladder mucosa invasion or areas of concern. Rectal exam grossly no disease.    ECOG = 1  0 - Asymptomatic (Fully active, able to carry on all predisease activities without restriction)  1 - Symptomatic but completely ambulatory (Restricted in physically strenuous activity but ambulatory and able to carry out work of a light or sedentary nature. For example, light housework,  office work)  2 - Symptomatic, <50% in bed during the day (Ambulatory and capable of all self care but unable to carry out any work activities. Up and about more than 50% of waking hours)  3 - Symptomatic, >50% in bed, but not bedbound (Capable of only limited self-care, confined to bed or chair 50% or more of waking hours)  4 - Bedbound (Completely disabled. Cannot carry on any self-care. Totally confined to bed or chair)  5 - Death    Eustace Pen MM, Creech RH, Tormey DC, et al. [phone]). "Toxicity and response criteria of the [facility]". [place] Oncol. 5 (6): 649-55  LABORATORY DATA:  Lab Results  Component Value Date   WBC 4.1 06/22/2018   HGB 10.0 (L) 06/22/2018   HCT 31.6 (L) 06/22/2018   MCV 84.0 06/22/2018   PLT 181 06/22/2018   NEUTROABS 3.5 06/22/2018   Lab Results  Component Value Date   NA 139 06/22/2018   K 3.6 06/22/2018   CL 100 06/22/2018   CO2 30 06/22/2018   GLUCOSE 86 06/22/2018   CREATININE 0.82 06/22/2018   CALCIUM 8.8 (L) 06/22/2018      RADIOGRAPHY: No results found.    IMPRESSION: FIGO Stage IIIC1 Invasive squamous cell carcinoma to cervix.    Patient will be a good candidate for definitive course of radiation therapy along with radio sensitizing chemotherapy, radiation therapy would consist of 6 weeks external beam radiation therapy and 5 intracavitary brachytherapy treatments. patient is now ready to proceed with her brachytherapy treatments.    PLAN: she will be taken to the operating room on October 14 at 7:30 AM for exam under anesthesia and placement of brachytherapy equipment in preparation for high-dose rate radiation therapy using iridium 192 as the high-dose-rate source. Depending on the operative findings and the patient's anatomy she will have placement of a tandem ring apparatus or a tandem cylinder apparatus.    ------------------------------------------------  Blair Promise, PhD, MD      This document serves as a record of services personally performed by Gery Pray, MD. It was created on his behalf by Steva Colder, a trained medical scribe. The creation of this record is based on the scribe's personal observations and the provider's statements to them. This document has been checked and approved by the attending provider.

## 2018-06-26 NOTE — H&P (View-Only) (Signed)
Radiation Oncology         (336) [phone] ________________________________  History and physical examination  Name: Erin Kent MRN: [id number]  Date: 06/26/18  DOB: Oct 28, 1950    REFERRING PHYSICIAN: Precious Haws  DIAGNOSIS: FIGO Stage IIIC1, invasive squamous cell carcinoma to cervix (FIGO 2018 staging)   HISTORY OF PRESENT ILLNESS::Erin Kent is a 67 y.o. female who is presenting to the office today for evaluation of newly diagnosed cervical cancer. She is accompanied by her roommate, [name]. She initially presented with vaginal bleeding and was evaluated in the Emergency Department on 03/13/2018. She notes that she had trace vaginal bleeding prior to this episode. She is currently having to use a pad to collect the drainage from her vagina. She denies back pain or abdominal pain. She has lost 10 pounds, however, she hasn't weighed herself in the past year. The patient has a lack of appetite and her roommate ensures that she takes a multivitamin, iron supplements, and ensure. 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 She lives in the [place] area.   She has underwent a routine pap smear on 04/12/2018 that showed: squamous cell carcinoma. On pathology it was noted: Cervix, biopsy, mass with invasive squamous cell carcinoma.   She then continued to a PET scan on 04/25/2018 that showed: Large cervical mass may invade into the myometrium in down into the vagina, maximum SUV 21.3. There is a malignant left external iliac node measuring 1.4 cm in short axis with maximum SUV 14.1. Accentuated activity in the cecum and ascending colon is likely physiologic given that it has no CT correlate. Correlation with the patient's colon cancer screening history is recommended. Aortic Atherosclerosis (ICD10-I70.0).  On review of systems, she reports mild vaginal discharge s/p recent procedure. she denies HA, double vision, and any other symptoms. She denies family hx of breast  or ovarian cancer. She doesn't have a PCP at this time, however, her roommate [name] is in the process of obtaining a PCP for her.  The patient has completed most of her external beam radiation therapy and radiosensitizing chemotherapy. She now presents to the operating room for placement of brachytherapy equipment in preparation for her first high-dose-rate treatment.   PREVIOUS RADIATION THERAPY: No  PAST MEDICAL HISTORY:  has a past medical history of Aortic atherosclerosis ([place]), Cervical cancer ([place]), Depression, Diarrhea, Hypokalemia, Iron deficiency anemia, Osteopenia, Ovarian cyst, Pancytopenia ([place]) (2019), and Port-A-Cath in place.    PAST SURGICAL HISTORY: Past Surgical History:  Procedure Laterality Date  . CYSTOSCOPY N/A 04/28/2018   Procedure: CYSTOSCOPY;  Surgeon: Isabel Caprice, MD;  Location: [facility];  Service: Gynecology;  Laterality: N/A;  . EUA/ PAPSMEAR/  CERVICAL MASS BIOPSY  04-12-2018   dr peggy constant @[facility]   . IR IMAGING GUIDED PORT INSERTION  05/12/2018  . WISDOM TOOTH EXTRACTION      FAMILY HISTORY: family history includes Hypertension in her mother.  SOCIAL HISTORY:  reports that she has never smoked. She has never used smokeless tobacco. She reports that she does not drink alcohol or use drugs.  ALLERGIES: Patient has no known allergies.  MEDICATIONS:  No current facility-administered medications for this encounter.    Current Outpatient Medications  Medication Sig Dispense Refill  . dexamethasone (DECADRON) 4 MG tablet Take 1 tablet (4 mg total) by mouth daily. 30 tablet 0  . hydrOXYzine (ATARAX/VISTARIL) 25 MG tablet Take 25 mg by mouth 3 (three) times daily as needed for  anxiety.    . lidocaine-prilocaine (EMLA) cream Apply to affected area once (Patient taking differently: Apply 1 application topically as needed (for port acces). ) 30 g 3  . ondansetron (ZOFRAN) 8 MG tablet Take 1 tablet (8 mg total) by mouth every 8 (eight) hours as  needed. Start on the third day after chemotherapy. (Patient not taking: Reported on 06/10/2018) 30 tablet 1  . oxyCODONE-acetaminophen (PERCOCET/ROXICET) 5-325 MG tablet Take 1-2 tablets by mouth every 6 (six) hours as needed. (Patient not taking: Reported on 05/04/2018) 20 tablet 0  . prochlorperazine (COMPAZINE) 10 MG tablet Take 1 tablet (10 mg total) by mouth every 6 (six) hours as needed (Nausea or vomiting). (Patient not taking: Reported on 06/03/2018) 30 tablet 1  . sertraline (ZOLOFT) 25 MG tablet Take 1 tablet (25 mg total) by mouth daily. 30 tablet 0  . traZODone (DESYREL) 50 MG tablet Take 1 tablet (50 mg total) by mouth at bedtime. 30 tablet 0   Facility-Administered Medications Ordered in Other Encounters  Medication Dose Route Frequency Provider Last Rate Last Dose  . sodium chloride 0.9 % 1,000 mL with potassium chloride 20 mEq infusion   Intravenous Continuous [name], MD   Stopped at 06/09/18 1406    REVIEW OF SYSTEMS:  A 10+ POINT REVIEW OF SYSTEMS WAS OBTAINED including neurology, dermatology, psychiatry, cardiac, respiratory, lymph, extremities, GI, GU, musculoskeletal, constitutional, reproductive, HEENT. All pertinent positives are noted in the HPI. All others are negative.    PHYSICAL EXAM:  height is 5\' 1"  (1.549 m) and weight is 80 lb (36.3 kg).   General: Alert and oriented, in no acute distress. Emaciated appearance.  HEENT: Head is normocephalic. Extraocular movements are intact. Oropharynx is clear. Neck: Neck is supple, no palpable cervical or supraclavicular lymphadenopathy. Heart: Regular in rate and rhythm with no murmurs, rubs, or gallops. Chest: Clear to auscultation bilaterally, with no rhonchi, wheezes, or rales. Abdomen: Soft, nontender, nondistended, with no rigidity or guarding. Extremities: No cyanosis or edema. Lymphatics: see Neck Exam Skin: No concerning lesions. Musculoskeletal: symmetric strength and muscle tone throughout. Neurologic: Cranial  nerves II through XII are grossly intact. No obvious focalities. Speech is fluent. Coordination is intact. Psychiatric: Judgment and insight are intact. Affect is appropriate. Pelvic: The patient wasn't able to tolerate a pelvic exam due to pain. I was able to place an index finger in the vaginal vault and palpable tumor extended along the anterior vaginal wall to approximately the lower 1/3 of the vagina. No palpable inguinal adenopathy.   Exam under anesthesia by Dr. Gerarda Fraction revealed: Vulva: Normal external female genitalia.  Bladder/urethra: Urethral meatus normal in size and location. No lesions or masses, well supported bladder   Operative Findings:   1. Parametrial involvement on right  2. Right sided subvaginal tumor burden ~3cm. This approximates the right ureteral insertion into the bladder based on my exam during cystoscopy. 3. Extension of disease down upper half of vagina on lateral and posterior walls. 4. Extension of disease down to lower third of anterior vagina (versus skip lesion) thus making her Stage 3A 5. Cystoscopy revealed patent bilateral ureteral orifices and no bladder mucosa invasion or areas of concern. Rectal exam grossly no disease.    ECOG = 1  0 - Asymptomatic (Fully active, able to carry on all predisease activities without restriction)  1 - Symptomatic but completely ambulatory (Restricted in physically strenuous activity but ambulatory and able to carry out work of a light or sedentary nature. For example, light housework,  office work)  2 - Symptomatic, <50% in bed during the day (Ambulatory and capable of all self care but unable to carry out any work activities. Up and about more than 50% of waking hours)  3 - Symptomatic, >50% in bed, but not bedbound (Capable of only limited self-care, confined to bed or chair 50% or more of waking hours)  4 - Bedbound (Completely disabled. Cannot carry on any self-care. Totally confined to bed or chair)  5 - Death    Eustace Pen MM, Creech RH, Tormey DC, et al. [phone]). "Toxicity and response criteria of the [facility]". [place] Oncol. 5 (6): 649-55  LABORATORY DATA:  Lab Results  Component Value Date   WBC 4.1 06/22/2018   HGB 10.0 (L) 06/22/2018   HCT 31.6 (L) 06/22/2018   MCV 84.0 06/22/2018   PLT 181 06/22/2018   NEUTROABS 3.5 06/22/2018   Lab Results  Component Value Date   NA 139 06/22/2018   K 3.6 06/22/2018   CL 100 06/22/2018   CO2 30 06/22/2018   GLUCOSE 86 06/22/2018   CREATININE 0.82 06/22/2018   CALCIUM 8.8 (L) 06/22/2018      RADIOGRAPHY: No results found.    IMPRESSION: FIGO Stage IIIC1 Invasive squamous cell carcinoma to cervix.    Patient will be a good candidate for definitive course of radiation therapy along with radio sensitizing chemotherapy, radiation therapy would consist of 6 weeks external beam radiation therapy and 5 intracavitary brachytherapy treatments. patient is now ready to proceed with her brachytherapy treatments.    PLAN: she will be taken to the operating room on October 14 at 7:30 AM for exam under anesthesia and placement of brachytherapy equipment in preparation for high-dose rate radiation therapy using iridium 192 as the high-dose-rate source. Depending on the operative findings and the patient's anatomy she will have placement of a tandem ring apparatus or a tandem cylinder apparatus.    ------------------------------------------------  Blair Promise, PhD, MD      This document serves as a record of services personally performed by Gery Pray, MD. It was created on his behalf by Steva Colder, a trained medical scribe. The creation of this record is based on the scribe's personal observations and the provider's statements to them. This document has been checked and approved by the attending provider.

## 2018-06-26 NOTE — Anesthesia Preprocedure Evaluation (Addendum)
Anesthesia Evaluation  Patient identified by MRN, date of birth, ID band Patient awake    Reviewed: Allergy & Precautions, NPO status , Patient's Chart, lab work & pertinent test results  Airway Mallampati: II  TM Distance: >3 FB Neck ROM: Full    Dental no notable dental hx.    Pulmonary neg pulmonary ROS,    Pulmonary exam normal breath sounds clear to auscultation       Cardiovascular negative cardio ROS Normal cardiovascular exam Rhythm:Regular Rate:Normal     Neuro/Psych PSYCHIATRIC DISORDERS Depression negative neurological ROS     GI/Hepatic negative GI ROS, Neg liver ROS,   Endo/Other  On chemo and radiation  Renal/GU negative Renal ROS     Musculoskeletal negative musculoskeletal ROS (+)   Abdominal   Peds  Hematology  (+) anemia ,   Anesthesia Other Findings Day of surgery medications reviewed with the patient.    Cervical cancer  Reproductive/Obstetrics                            Anesthesia Physical Anesthesia Plan  ASA: III  Anesthesia Plan: General   Post-op Pain Management:    Induction: Intravenous  PONV Risk Score and Plan: 3 and Ondansetron, Dexamethasone and Treatment may vary due to age or medical condition  Airway Management Planned: LMA  Additional Equipment:   Intra-op Plan:   Post-operative Plan: Extubation in OR  Informed Consent: I have reviewed the patients History and Physical, chart, labs and discussed the procedure including the risks, benefits and alternatives for the proposed anesthesia with the patient or authorized representative who has indicated his/her understanding and acceptance.   Dental advisory given  Plan Discussed with: CRNA  Anesthesia Plan Comments:        Anesthesia Quick Evaluation

## 2018-06-26 NOTE — H&P (View-Only) (Signed)
Radiation Oncology         [phone] ________________________________  History and physical examination  Name: Erin Kent MRN: [id number]  Date: 06/26/18  DOB: 10/26/1950    REFERRING PHYSICIAN: Precious Haws  DIAGNOSIS: FIGO Stage IIIC1, invasive squamous cell carcinoma to cervix (FIGO 2018 staging)   HISTORY OF PRESENT ILLNESS::Erin Kent is a 67 y.o. female who is presenting to the office today for evaluation of newly diagnosed cervical cancer. She is accompanied by her roommate, [name]. She initially presented with vaginal bleeding and was evaluated in the Emergency Department on 03/13/2018. She notes that she had trace vaginal bleeding prior to this episode. She is currently having to use a pad to collect the drainage from her vagina. She denies back pain or abdominal pain. She has lost 10 pounds, however, she hasn't weighed herself in the past year. The patient has a lack of appetite and her roommate ensures that she takes a multivitamin, iron supplements, and ensure. 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 She lives in the [place] area.   She has underwent a routine pap smear on 04/12/2018 that showed: squamous cell carcinoma. On pathology it was noted: Cervix, biopsy, mass with invasive squamous cell carcinoma.   She then continued to a PET scan on 04/25/2018 that showed: Large cervical mass may invade into the myometrium in down into the vagina, maximum SUV 21.3. There is a malignant left external iliac node measuring 1.4 cm in short axis with maximum SUV 14.1. Accentuated activity in the cecum and ascending colon is likely physiologic given that it has no CT correlate. Correlation with the patient's colon cancer screening history is recommended. Aortic Atherosclerosis (ICD10-I70.0).  On review of systems, she reports mild vaginal discharge s/p recent procedure. she denies HA, double vision, and any other symptoms. She denies family hx of breast  or ovarian cancer. She doesn't have a PCP at this time, however, her roommate [name] is in the process of obtaining a PCP for her.  The patient has completed most of her external beam radiation therapy and radiosensitizing chemotherapy. She now presents to the operating room for placement of brachytherapy equipment in preparation for her first high-dose-rate treatment.   PREVIOUS RADIATION THERAPY: No  PAST MEDICAL HISTORY:  has a past medical history of Aortic atherosclerosis ([place]), Cervical cancer ([place]), Depression, Diarrhea, Hypokalemia, Iron deficiency anemia, Osteopenia, Ovarian cyst, Pancytopenia ([place]) (2019), and Port-A-Cath in place.    PAST SURGICAL HISTORY: Past Surgical History:  Procedure Laterality Date  . CYSTOSCOPY N/A 04/28/2018   Procedure: CYSTOSCOPY;  Surgeon: Isabel Caprice, MD;  Location: [facility];  Service: Gynecology;  Laterality: N/A;  . EUA/ PAPSMEAR/  CERVICAL MASS BIOPSY  04-12-2018   dr peggy constant @[facility]   . IR IMAGING GUIDED PORT INSERTION  05/12/2018  . WISDOM TOOTH EXTRACTION      FAMILY HISTORY: family history includes Hypertension in her mother.  SOCIAL HISTORY:  reports that she has never smoked. She has never used smokeless tobacco. She reports that she does not drink alcohol or use drugs.  ALLERGIES: Patient has no known allergies.  MEDICATIONS:  No current facility-administered medications for this encounter.    Current Outpatient Medications  Medication Sig Dispense Refill  . dexamethasone (DECADRON) 4 MG tablet Take 1 tablet (4 mg total) by mouth daily. 30 tablet 0  . hydrOXYzine (ATARAX/VISTARIL) 25 MG tablet Take 25 mg by mouth 3 (three) times daily as needed for  anxiety.    . lidocaine-prilocaine (EMLA) cream Apply to affected area once (Patient taking differently: Apply 1 application topically as needed (for port acces). ) 30 g 3  . ondansetron (ZOFRAN) 8 MG tablet Take 1 tablet (8 mg total) by mouth every 8 (eight) hours as  needed. Start on the third day after chemotherapy. (Patient not taking: Reported on 06/10/2018) 30 tablet 1  . oxyCODONE-acetaminophen (PERCOCET/ROXICET) 5-325 MG tablet Take 1-2 tablets by mouth every 6 (six) hours as needed. (Patient not taking: Reported on 05/04/2018) 20 tablet 0  . prochlorperazine (COMPAZINE) 10 MG tablet Take 1 tablet (10 mg total) by mouth every 6 (six) hours as needed (Nausea or vomiting). (Patient not taking: Reported on 06/03/2018) 30 tablet 1  . sertraline (ZOLOFT) 25 MG tablet Take 1 tablet (25 mg total) by mouth daily. 30 tablet 0  . traZODone (DESYREL) 50 MG tablet Take 1 tablet (50 mg total) by mouth at bedtime. 30 tablet 0   Facility-Administered Medications Ordered in Other Encounters  Medication Dose Route Frequency Provider Last Rate Last Dose  . sodium chloride 0.9 % 1,000 mL with potassium chloride 20 mEq infusion   Intravenous Continuous [name], MD   Stopped at 06/09/18 1406    REVIEW OF SYSTEMS:  A 10+ POINT REVIEW OF SYSTEMS WAS OBTAINED including neurology, dermatology, psychiatry, cardiac, respiratory, lymph, extremities, GI, GU, musculoskeletal, constitutional, reproductive, HEENT. All pertinent positives are noted in the HPI. All others are negative.    PHYSICAL EXAM:  height is 5\' 1"  (1.549 m) and weight is 80 lb (36.3 kg).   General: Alert and oriented, in no acute distress. Emaciated appearance.  HEENT: Head is normocephalic. Extraocular movements are intact. Oropharynx is clear. Neck: Neck is supple, no palpable cervical or supraclavicular lymphadenopathy. Heart: Regular in rate and rhythm with no murmurs, rubs, or gallops. Chest: Clear to auscultation bilaterally, with no rhonchi, wheezes, or rales. Abdomen: Soft, nontender, nondistended, with no rigidity or guarding. Extremities: No cyanosis or edema. Lymphatics: see Neck Exam Skin: No concerning lesions. Musculoskeletal: symmetric strength and muscle tone throughout. Neurologic: Cranial  nerves II through XII are grossly intact. No obvious focalities. Speech is fluent. Coordination is intact. Psychiatric: Judgment and insight are intact. Affect is appropriate. Pelvic: The patient wasn't able to tolerate a pelvic exam due to pain. I was able to place an index finger in the vaginal vault and palpable tumor extended along the anterior vaginal wall to approximately the lower 1/3 of the vagina. No palpable inguinal adenopathy.   Exam under anesthesia by Dr. Gerarda Fraction revealed: Vulva: Normal external female genitalia.  Bladder/urethra: Urethral meatus normal in size and location. No lesions or masses, well supported bladder   Operative Findings:   1. Parametrial involvement on right  2. Right sided subvaginal tumor burden ~3cm. This approximates the right ureteral insertion into the bladder based on my exam during cystoscopy. 3. Extension of disease down upper half of vagina on lateral and posterior walls. 4. Extension of disease down to lower third of anterior vagina (versus skip lesion) thus making her Stage 3A 5. Cystoscopy revealed patent bilateral ureteral orifices and no bladder mucosa invasion or areas of concern. Rectal exam grossly no disease.    ECOG = 1  0 - Asymptomatic (Fully active, able to carry on all predisease activities without restriction)  1 - Symptomatic but completely ambulatory (Restricted in physically strenuous activity but ambulatory and able to carry out work of a light or sedentary nature. For example, light housework,  office work)  2 - Symptomatic, <50% in bed during the day (Ambulatory and capable of all self care but unable to carry out any work activities. Up and about more than 50% of waking hours)  3 - Symptomatic, >50% in bed, but not bedbound (Capable of only limited self-care, confined to bed or chair 50% or more of waking hours)  4 - Bedbound (Completely disabled. Cannot carry on any self-care. Totally confined to bed or chair)  5 - Death    Eustace Pen MM, Creech RH, Tormey DC, et al. [phone]). "Toxicity and response criteria of the [facility]". [place] Oncol. 5 (6): 649-55  LABORATORY DATA:  Lab Results  Component Value Date   WBC 4.1 06/22/2018   HGB 10.0 (L) 06/22/2018   HCT 31.6 (L) 06/22/2018   MCV 84.0 06/22/2018   PLT 181 06/22/2018   NEUTROABS 3.5 06/22/2018   Lab Results  Component Value Date   NA 139 06/22/2018   K 3.6 06/22/2018   CL 100 06/22/2018   CO2 30 06/22/2018   GLUCOSE 86 06/22/2018   CREATININE 0.82 06/22/2018   CALCIUM 8.8 (L) 06/22/2018      RADIOGRAPHY: No results found.    IMPRESSION: FIGO Stage IIIC1 Invasive squamous cell carcinoma to cervix.    Patient will be a good candidate for definitive course of radiation therapy along with radio sensitizing chemotherapy, radiation therapy would consist of 6 weeks external beam radiation therapy and 5 intracavitary brachytherapy treatments. patient is now ready to proceed with her brachytherapy treatments.    PLAN: she will be taken to the operating room on October 14 at 7:30 AM for exam under anesthesia and placement of brachytherapy equipment in preparation for high-dose rate radiation therapy using iridium 192 as the high-dose-rate source. Depending on the operative findings and the patient's anatomy she will have placement of a tandem ring apparatus or a tandem cylinder apparatus.    ------------------------------------------------  Blair Promise, PhD, MD      This document serves as a record of services personally performed by Gery Pray, MD. It was created on his behalf by Steva Colder, a trained medical scribe. The creation of this record is based on the scribe's personal observations and the provider's statements to them. This document has been checked and approved by the attending provider.

## 2018-06-27 ENCOUNTER — Ambulatory Visit (HOSPITAL_BASED_OUTPATIENT_CLINIC_OR_DEPARTMENT_OTHER): Payer: Medicaid Other | Admitting: Anesthesiology

## 2018-06-27 ENCOUNTER — Encounter (HOSPITAL_BASED_OUTPATIENT_CLINIC_OR_DEPARTMENT_OTHER)
Admission: RE | Disposition: A | Discharge status: Other Acute Inpatient Hospital | Payer: Self-pay | Source: Ambulatory Visit | Attending: Radiation Oncology

## 2018-06-27 ENCOUNTER — Encounter (HOSPITAL_BASED_OUTPATIENT_CLINIC_OR_DEPARTMENT_OTHER): Payer: Self-pay

## 2018-06-27 ENCOUNTER — Ambulatory Visit
Admission: RE | Admit: 2018-06-27 | Discharge: 2018-06-27 | Disposition: A | Discharge status: Home / Self Care | Payer: Medicaid Other | Source: Ambulatory Visit | Attending: Radiation Oncology | Admitting: Radiation Oncology

## 2018-06-27 ENCOUNTER — Ambulatory Visit: Payer: Medicaid Other

## 2018-06-27 ENCOUNTER — Ambulatory Visit (HOSPITAL_BASED_OUTPATIENT_CLINIC_OR_DEPARTMENT_OTHER)
Admission: RE | Admit: 2018-06-27 | Discharge: 2018-06-27 | Disposition: A | Discharge status: Other Acute Inpatient Hospital | Payer: Medicaid Other | Source: Ambulatory Visit | Attending: Radiation Oncology | Admitting: Radiation Oncology

## 2018-06-27 ENCOUNTER — Other Ambulatory Visit (HOSPITAL_BASED_OUTPATIENT_CLINIC_OR_DEPARTMENT_OTHER): Payer: Self-pay | Admitting: Radiation Oncology

## 2018-06-27 ENCOUNTER — Encounter: Payer: Self-pay | Admitting: Oncology

## 2018-06-27 ENCOUNTER — Ambulatory Visit (HOSPITAL_COMMUNITY)
Admission: RE | Admit: 2018-06-27 | Discharge: 2018-06-27 | Disposition: A | Discharge status: Home / Self Care | Payer: Medicaid Other | Source: Ambulatory Visit | Attending: Hematology and Oncology | Admitting: Hematology and Oncology

## 2018-06-27 VITALS — BP 140/77 | HR 58 | Resp 18

## 2018-06-27 DIAGNOSIS — C539 Malignant neoplasm of cervix uteri, unspecified: Secondary | ICD-10-CM

## 2018-06-27 DIAGNOSIS — I700 Atherosclerosis of aorta: Secondary | ICD-10-CM | POA: Diagnosis not present

## 2018-06-27 DIAGNOSIS — F329 Major depressive disorder, single episode, unspecified: Secondary | ICD-10-CM | POA: Diagnosis not present

## 2018-06-27 DIAGNOSIS — M8580 Other specified disorders of bone density and structure, unspecified site: Secondary | ICD-10-CM | POA: Diagnosis not present

## 2018-06-27 DIAGNOSIS — Z510 Encounter for antineoplastic radiation therapy: Secondary | ICD-10-CM | POA: Diagnosis not present

## 2018-06-27 HISTORY — DX: Hypokalemia: E87.6

## 2018-06-27 HISTORY — DX: Unspecified ovarian cyst, unspecified side: N83.209

## 2018-06-27 HISTORY — DX: Diarrhea, unspecified: R19.7

## 2018-06-27 HISTORY — DX: Other specified disorders of bone density and structure, unspecified site: M85.80

## 2018-06-27 HISTORY — DX: Presence of other vascular implants and grafts: Z95.828

## 2018-06-27 HISTORY — PX: TANDEM RING INSERTION: SHX6199

## 2018-06-27 HISTORY — DX: Atherosclerosis of aorta: I70.0

## 2018-06-27 LAB — POCT I-STAT 4, (NA,K, GLUC, HGB,HCT)
GLUCOSE: 81 mg/dL (ref 70–99)
HEMATOCRIT: 31 % — AB (ref 36.0–46.0)
HEMOGLOBIN: 10.5 g/dL — AB (ref 12.0–15.0)
Potassium: 3.5 mmol/L (ref 3.5–5.1)
SODIUM: 137 mmol/L (ref 135–145)

## 2018-06-27 SURGERY — INSERTION, UTERINE TANDEM AND RING OR CYLINDER, FOR BRACHYTHERAPY
Anesthesia: General | Site: Vagina

## 2018-06-27 MED ORDER — OXYTOCIN 10 UNIT/ML IJ SOLN
INTRAMUSCULAR | Status: AC
  Filled 2018-06-27: qty 2

## 2018-06-27 MED ORDER — ONDANSETRON HCL 4 MG/2ML IJ SOLN
INTRAMUSCULAR | Status: DC | PRN
  Administered 2018-06-27: 4 mg via INTRAVENOUS

## 2018-06-27 MED ORDER — LIDOCAINE 2% (20 MG/ML) 5 ML SYRINGE
INTRAMUSCULAR | Status: AC
  Filled 2018-06-27: qty 5

## 2018-06-27 MED ORDER — LACTATED RINGERS IV SOLN
INTRAVENOUS | Status: DC
  Administered 2018-06-27: 10:00:00 via INTRAVENOUS
  Filled 2018-06-27 (×2): qty 250

## 2018-06-27 MED ORDER — PROPOFOL 10 MG/ML IV BOLUS
INTRAVENOUS | Status: AC
  Filled 2018-06-27: qty 20

## 2018-06-27 MED ORDER — PROPOFOL 10 MG/ML IV BOLUS
INTRAVENOUS | Status: DC | PRN
  Administered 2018-06-27: 80 mg via INTRAVENOUS

## 2018-06-27 MED ORDER — MIDAZOLAM HCL 2 MG/2ML IJ SOLN
INTRAMUSCULAR | Status: AC
  Filled 2018-06-27: qty 2

## 2018-06-27 MED ORDER — FENTANYL CITRATE (PF) 100 MCG/2ML IJ SOLN
INTRAMUSCULAR | Status: DC | PRN
  Administered 2018-06-27: 50 ug via INTRAVENOUS
  Administered 2018-06-27: 25 ug via INTRAVENOUS

## 2018-06-27 MED ORDER — DEXAMETHASONE SODIUM PHOSPHATE 4 MG/ML IJ SOLN
INTRAMUSCULAR | Status: DC | PRN
  Administered 2018-06-27: 5 mg via INTRAVENOUS

## 2018-06-27 MED ORDER — LACTATED RINGERS IV SOLN
INTRAVENOUS | Status: DC
  Administered 2018-06-27: 07:00:00 via INTRAVENOUS
  Filled 2018-06-27: qty 1000

## 2018-06-27 MED ORDER — ONDANSETRON HCL 4 MG/2ML IJ SOLN
4.0000 mg | Freq: Once | INTRAMUSCULAR | Status: DC | PRN
  Filled 2018-06-27: qty 2

## 2018-06-27 MED ORDER — ONDANSETRON HCL 4 MG/2ML IJ SOLN
INTRAMUSCULAR | Status: AC
  Filled 2018-06-27: qty 2

## 2018-06-27 MED ORDER — DEXAMETHASONE SODIUM PHOSPHATE 10 MG/ML IJ SOLN
INTRAMUSCULAR | Status: AC
  Filled 2018-06-27: qty 1

## 2018-06-27 MED ORDER — ARTIFICIAL TEARS OPHTHALMIC OINT
TOPICAL_OINTMENT | OPHTHALMIC | Status: AC
  Filled 2018-06-27: qty 3.5

## 2018-06-27 MED ORDER — LIDOCAINE 2% (20 MG/ML) 5 ML SYRINGE
INTRAMUSCULAR | Status: DC | PRN
  Administered 2018-06-27: 40 mg via INTRAVENOUS

## 2018-06-27 MED ORDER — FENTANYL CITRATE (PF) 100 MCG/2ML IJ SOLN
25.0000 ug | INTRAMUSCULAR | Status: DC | PRN
  Filled 2018-06-27: qty 1

## 2018-06-27 MED ORDER — PHENYLEPHRINE 40 MCG/ML (10ML) SYRINGE FOR IV PUSH (FOR BLOOD PRESSURE SUPPORT)
PREFILLED_SYRINGE | INTRAVENOUS | Status: DC | PRN
  Administered 2018-06-27: 120 ug via INTRAVENOUS

## 2018-06-27 MED ORDER — SODIUM CHLORIDE 0.9 % IR SOLN
Status: DC | PRN
  Administered 2018-06-27: 1000 mL

## 2018-06-27 MED ORDER — FENTANYL CITRATE (PF) 100 MCG/2ML IJ SOLN
INTRAMUSCULAR | Status: AC
  Filled 2018-06-27: qty 2

## 2018-06-27 SURGICAL SUPPLY — 23 items
BAG URINE DRAINAGE (UROLOGICAL SUPPLIES) ×3 IMPLANT
BNDG CONFORM 2 STRL LF (GAUZE/BANDAGES/DRESSINGS) IMPLANT
CATH FOLEY 2WAY SLVR  5CC 16FR (CATHETERS) ×2
CATH FOLEY 2WAY SLVR 5CC 16FR (CATHETERS) ×1 IMPLANT
DILATOR CANAL MILEX (MISCELLANEOUS) IMPLANT
GAUZE 4X4 16PLY RFD (DISPOSABLE) ×3 IMPLANT
GLOVE BIO SURGEON STRL SZ7 (GLOVE) ×2 IMPLANT
GLOVE BIO SURGEON STRL SZ7.5 (GLOVE) ×6 IMPLANT
GLOVE BIOGEL PI IND STRL 7.5 (GLOVE) IMPLANT
GLOVE BIOGEL PI INDICATOR 7.5 (GLOVE) ×2
GOWN STRL REUS W/TWL LRG LVL3 (GOWN DISPOSABLE) ×5 IMPLANT
HOLDER FOLEY CATH W/STRAP (MISCELLANEOUS) ×3 IMPLANT
HOVERMATT SINGLE USE (MISCELLANEOUS) ×3 IMPLANT
IV NS 1000ML (IV SOLUTION) ×3
IV NS 1000ML BAXH (IV SOLUTION) ×1 IMPLANT
KIT TURNOVER CYSTO (KITS) ×3 IMPLANT
PACK VAGINAL MINOR WOMEN LF (CUSTOM PROCEDURE TRAY) ×3 IMPLANT
PACKING VAGINAL (PACKING) IMPLANT
PAD ABD 8X10 STRL (GAUZE/BANDAGES/DRESSINGS) ×3 IMPLANT
PAD OB MATERNITY 4.3X12.25 (PERSONAL CARE ITEMS) IMPLANT
SET IRRIG Y TYPE TUR BLADDER L (SET/KITS/TRAYS/PACK) ×3 IMPLANT
TOWEL OR 17X24 6PK STRL BLUE (TOWEL DISPOSABLE) ×6 IMPLANT
WATER STERILE IRR 500ML POUR (IV SOLUTION) ×3 IMPLANT

## 2018-06-27 NOTE — Transfer of Care (Signed)
Last Vitals:  Vitals Value Taken Time  BP 141/67 06/27/2018  8:42 AM  Temp    Pulse 71 06/27/2018  8:43 AM  Resp 12 06/27/2018  8:43 AM  SpO2 100 % 06/27/2018  8:43 AM  Vitals shown include unvalidated device data.  Last Pain:  Vitals:   06/27/18 0622  TempSrc:   PainSc: 0-No pain      Patients Stated Pain Goal: 3 (06/27/18 0962)  Immediate Anesthesia Transfer of Care Note  Patient: Erin Kent  Procedure(s) Performed: Procedure(s) (LRB): TANDEM RING INSERTION (N/A)  Patient Location: PACU  Anesthesia Type: General  Level of Consciousness: awake, alert  and oriented  Airway & Oxygen Therapy: Patient Spontanous Breathing and Patient connected to nasal cannula oxygen  Post-op Assessment: Report given to PACU RN and Post -op Vital signs reviewed and stable  Post vital signs: Reviewed and stable  Complications: No apparent anesthesia complications

## 2018-06-27 NOTE — Op Note (Signed)
06/27/2018  12:59 PM  PATIENT:  Erin Kent  67 y.o. female  PRE-OPERATIVE DIAGNOSIS:  cervical cancer  POST-OPERATIVE DIAGNOSIS:  cervical cancer  PROCEDURE:  Procedure(s): TANDEM RING INSERTION (N/A)  SURGEON:  Surgeon(s) and Role:    * Gery Pray, MD - Primary  PHYSICIAN ASSISTANT:   ASSISTANTS: none   ANESTHESIA:   general  EBL:  5 mL   BLOOD ADMINISTERED:none  DRAINS: Urinary Catheter (Foley)   LOCAL MEDICATIONS USED:  NONE  SPECIMEN:  No Specimen  DISPOSITION OF SPECIMEN:  N/A  COUNTS:  YES  TOURNIQUET:  * No tourniquets in log *  DICTATION: the patient was taken to outpatient room #3. A general anesthetic was applied. Patient was prepped and draped in the usual sterile fashion and placed in the dorsolithotomy position. A time out from the procedure,  anticipated length of the procedure and preoperative medications was performed prior to initiation of the procedure. Exam under anesthesia showed excellent response to the patient's prior external beam and radiosensitizing chemotherapy. The cervix was essentially normal in size. The patient did bleed easily during prep and during the procedure. Review of the vaginal vault revealed no obvious residual tumor along the anterior distal vaginal cavity. Patient did have a Foley catheter placed and this was used for ultrasound imaging. The Foley catheter was back filled with approximately 200 mL of sterile water. Excellent imaging was obtained. The uterus was noted to be quite small and mildly anteverted. The uterus was estimated to be approximately 5-6 cm in length. Patient proceeded to undergo dilation of the cervical os. Given the narrowed vaginal cavity patient could not have placement of the standard ring for high-dose rate radiation therapy. Was decided that point that the patient should be treated with a tandem cylinder apparatus. Patient had a 15 40 mm tandem placed within the cavity showing accurate placement within  the endometrial canal. Patient then had a 2.5 cm diameter cylinder attached to the tandem. Patient tolerated the procedure well and subsequently transported to the recovery room in stable condition. Later today the patient will undergo planning and her first high-dose-rate treatment directed at the cervix.  PLAN OF CARE: transferred to radiation oncology for planning and treatment  PATIENT DISPOSITION:  PACU - hemodynamically stable.   Delay start of Pharmacological VTE agent (>24hrs) due to surgical blood loss or risk of bleeding: not applicable

## 2018-06-27 NOTE — Discharge Instructions (Signed)

## 2018-06-27 NOTE — Progress Notes (Signed)
  Radiation Oncology         [phone] ________________________________  Name: SHAMYAH STANTZ MRN: [id number]  Date: 06/27/2018  DOB: 06/22/1951  SIMULATION AND TREATMENT PLANNING NOTE HDR BRACHYTHERAPY  DIAGNOSIS:  Stage IIIC1,invasive squamous cell carcinoma to cervix(FIGO 2018 Staging)  NARRATIVE:  The patient was brought to the [facility].  Identity was confirmed.  All relevant records and images related to the planned course of therapy were reviewed.  The patient freely provided informed written consent to proceed with treatment after reviewing the details related to the planned course of therapy. The consent form was witnessed and verified by the simulation staff.  Then, the patient was set-up in a stable reproducible  supine position for radiation therapy.  CT images were obtained.  Surface markings were placed.  The CT images were loaded into the planning software.  Then the target and avoidance structures were contoured.  Treatment planning then occurred.  The radiation prescription was entered and confirmed.   I have requested : Brachytherapy Isodose Plan and Dosimetry Calculations to plan the radiation distribution.    PLAN:  The patient will receive 5.5 Gy in 1 fraction. tHe patient will be treated with the tandem cylinder system. Iridium 192 will be the high-dose-rate source    ________________________________  Blair Promise, PhD, MD  This document serves as a record of services personally performed by Gery Pray, MD. It was created on his behalf by Wilburn Mylar, a trained medical scribe. The creation of this record is based on the scribe's personal observations and the provider's statements to them. This document has been checked and approved by the attending provider.

## 2018-06-27 NOTE — Anesthesia Procedure Notes (Signed)
Procedure Name: LMA Insertion Date/Time: 06/27/2018 7:37 AM Performed by: Murvin Natal, MD Pre-anesthesia Checklist: Patient identified, Emergency Drugs available, Suction available and Patient being monitored Patient Re-evaluated:Patient Re-evaluated prior to induction Oxygen Delivery Method: Circle system utilized Preoxygenation: Pre-oxygenation with 100% oxygen Induction Type: IV induction Ventilation: Mask ventilation without difficulty LMA: LMA inserted LMA Size: 3.0 Number of attempts: 1 Airway Equipment and Method: Bite block Placement Confirmation: positive ETCO2 Tube secured with: Tape Dental Injury: Teeth and Oropharynx as per pre-operative assessment

## 2018-06-27 NOTE — Interval H&P Note (Signed)
History and Physical Interval Note:  06/27/2018 7:19 AM  Erin Kent  has presented today for surgery, with the diagnosis of cervical cancer  The various methods of treatment have been discussed with the patient and family. After consideration of risks, benefits and other options for treatment, the patient has consented to  Procedure(s): TANDEM RING INSERTION (N/A) as a surgical intervention .  The patient's history has been reviewed, patient examined, no change in status, stable for surgery.  I have reviewed the patient's chart and labs.  Questions were answered to the patient's satisfaction.     Gery Pray

## 2018-06-27 NOTE — Progress Notes (Signed)
  Radiation Oncology         [phone] ________________________________  Name: Erin Kent MRN: [id number]  Date: 06/27/2018  DOB: [birth year]  CC: Patient, [name]  Isabel Caprice, MD  HDR BRACHYTHERAPY NOTE  DIAGNOSIS: Stage IIIC1,invasive squamous cell carcinoma to cervix(FIGO 2018 Staging)  NARRATIVE: The patient was brought to the [place] suite. Identity was confirmed. All relevant records and images related to the planned course of therapy were reviewed. The patient freely provided informed written consent to proceed with treatment after reviewing the details related to the planned course of therapy. The consent form was witnessed and verified by the simulation staff. Then, the patient was set-up in a stable reproducible supine position for radiation therapy. The tandem cylinder system was accessed and fiducial markers were placed within the tandem and cylinder.   Simple treatment device note: On the operating room the patient had construction of her custom tandem cylinder system. She will be treated with a 15 tandem/cylinder system. The patient had placement of a 2.5 cm cylinder.   Verification simulation note: An AP and lateral film was obtained through the pelvis area. This was compared to the patient's planning films documenting accurate position of the tandem/cylinder system for treatment.  High-dose-rate brachytherapy treatment note:  The remote afterloading device was accessed through catheter system and attached to the tandem cylinder system. Patient then proceeded to undergo her first high-dose-rate treatment directed at the cervix. The patient was prescribed a dose of 5.5 gray to be delivered to the [place]. Patient was treated with 1 channels using 14 dwell positions. Treatment time was 466.4 seconds. The patient tolerated the procedure well. After completion of her therapy, a radiation survey was performed documenting return of the iridium source into the [employer] safe.  The patient was then transferred to the nursing suite. She then had removal of  the tandem and cylinder system. The patient tolerated the removal well.  PLAN: Patient will return next week for her second high-dose-rate treatment. ________________________________  Blair Promise, PhD, MD  This document serves as a record of services personally performed by Gery Pray, MD. It was created on his behalf by Wilburn Mylar, a trained medical scribe. The creation of this record is based on the scribe's personal observations and the provider's statements to them. This document has been checked and approved by the attending provider.

## 2018-06-27 NOTE — Anesthesia Postprocedure Evaluation (Signed)
Anesthesia Post Note  Patient: Aubreigh Fuerte [place]  Procedure(s) Performed: TANDEM RING INSERTION (N/A Vagina )     Patient location during evaluation: PACU Anesthesia Type: General Level of consciousness: awake Pain management: pain level controlled Vital Signs Assessment: post-procedure vital signs reviewed and stable Respiratory status: spontaneous breathing, nonlabored ventilation, respiratory function stable and patient connected to nasal cannula oxygen Cardiovascular status: blood pressure returned to baseline and stable Postop Assessment: no apparent nausea or vomiting Anesthetic complications: no    Last Vitals:  Vitals:   06/27/18 0915 06/27/18 0930  BP: (!) 143/66   Pulse: 67 64  Resp: 15 12  Temp:    SpO2: 98% 98%    Last Pain:  Vitals:   06/27/18 0915  TempSrc:   PainSc: 0-No pain                 Ryan P Ellender

## 2018-06-28 ENCOUNTER — Inpatient Hospital Stay: Payer: Medicaid Other

## 2018-06-28 ENCOUNTER — Encounter (HOSPITAL_BASED_OUTPATIENT_CLINIC_OR_DEPARTMENT_OTHER): Payer: Self-pay | Admitting: Radiation Oncology

## 2018-06-28 ENCOUNTER — Telehealth: Payer: Self-pay

## 2018-06-28 ENCOUNTER — Ambulatory Visit
Admission: RE | Admit: 2018-06-28 | Discharge: 2018-06-28 | Disposition: A | Discharge status: Home / Self Care | Payer: Medicaid Other | Source: Ambulatory Visit | Attending: Radiation Oncology | Admitting: Radiation Oncology

## 2018-06-28 VITALS — BP 117/74 | HR 78 | Temp 98.6°F | Resp 17

## 2018-06-28 DIAGNOSIS — C539 Malignant neoplasm of cervix uteri, unspecified: Secondary | ICD-10-CM

## 2018-06-28 DIAGNOSIS — Z510 Encounter for antineoplastic radiation therapy: Secondary | ICD-10-CM | POA: Diagnosis not present

## 2018-06-28 DIAGNOSIS — Z5111 Encounter for antineoplastic chemotherapy: Secondary | ICD-10-CM | POA: Diagnosis not present

## 2018-06-28 LAB — CBC WITH DIFFERENTIAL/PLATELET
ABS IMMATURE GRANULOCYTES: 0.04 10*3/uL (ref 0.00–0.07)
BASOS ABS: 0 10*3/uL (ref 0.0–0.1)
Basophils Relative: 0 %
EOS PCT: 0 %
Eosinophils Absolute: 0 10*3/uL (ref 0.0–0.5)
HEMATOCRIT: 31.2 % — AB (ref 36.0–46.0)
Hemoglobin: 9.8 g/dL — ABNORMAL LOW (ref 12.0–15.0)
IMMATURE GRANULOCYTES: 1 %
LYMPHS ABS: 0.2 10*3/uL — AB (ref 0.7–4.0)
LYMPHS PCT: 3 %
MCH: 27.1 pg (ref 26.0–34.0)
MCHC: 31.4 g/dL (ref 30.0–36.0)
MCV: 86.2 fL (ref 80.0–100.0)
Monocytes Absolute: 0.7 10*3/uL (ref 0.1–1.0)
Monocytes Relative: 9 %
NEUTROS ABS: 6.1 10*3/uL (ref 1.7–7.7)
NEUTROS PCT: 87 %
NRBC: 0 % (ref 0.0–0.2)
Platelets: 172 10*3/uL (ref 150–400)
RBC: 3.62 MIL/uL — AB (ref 3.87–5.11)
RDW: 25.5 % — ABNORMAL HIGH (ref 11.5–15.5)
WBC: 7 10*3/uL (ref 4.0–10.5)

## 2018-06-28 LAB — COMPREHENSIVE METABOLIC PANEL
ALBUMIN: 3.3 g/dL — AB (ref 3.5–5.0)
ALK PHOS: 56 U/L (ref 38–126)
ALT: 11 U/L (ref 0–44)
AST: 12 U/L — AB (ref 15–41)
Anion gap: 7 (ref 5–15)
BILIRUBIN TOTAL: 0.3 mg/dL (ref 0.3–1.2)
BUN: 14 mg/dL (ref 8–23)
CALCIUM: 8.7 mg/dL — AB (ref 8.9–10.3)
CO2: 33 mmol/L — ABNORMAL HIGH (ref 22–32)
Chloride: 98 mmol/L (ref 98–111)
Creatinine, Ser: 0.78 mg/dL (ref 0.44–1.00)
GFR calc Af Amer: >60 mL/min (ref >60)
GFR calc non Af Amer: >60 mL/min (ref >60)
GLUCOSE: 90 mg/dL (ref 70–99)
Potassium: 3.2 mmol/L — ABNORMAL LOW (ref 3.5–5.1)
Sodium: 138 mmol/L (ref 135–145)
TOTAL PROTEIN: 6.2 g/dL — AB (ref 6.5–8.1)

## 2018-06-28 LAB — MAGNESIUM: Magnesium: 1.6 mg/dL — ABNORMAL LOW (ref 1.7–2.4)

## 2018-06-28 MED ORDER — SODIUM CHLORIDE 0.9% FLUSH
10.0000 mL | Freq: Once | INTRAVENOUS | Status: AC
  Administered 2018-06-28: 10 mL
  Filled 2018-06-28: qty 10

## 2018-06-28 MED ORDER — SODIUM CHLORIDE 0.9 % IV SOLN
Freq: Once | INTRAVENOUS | Status: AC
  Administered 2018-06-28: 15:00:00 via INTRAVENOUS
  Filled 2018-06-28: qty 250

## 2018-06-28 MED ORDER — HEPARIN SOD (PORK) LOCK FLUSH 100 UNIT/ML IV SOLN
500.0000 [IU] | Freq: Once | INTRAVENOUS | Status: AC
  Administered 2018-06-28: 500 [IU]
  Filled 2018-06-28: qty 5

## 2018-06-28 NOTE — Patient Instructions (Signed)
Dehydration, Adult Dehydration is when there is not enough fluid or water in your body. This happens when you lose more fluids than you take in. Dehydration can range from mild to very bad. It should be treated right away to keep it from getting very bad. Symptoms of mild dehydration may include:  Thirst.  Dry lips.  Slightly dry mouth.  Dry, warm skin.  Dizziness. Symptoms of moderate dehydration may include:  Very dry mouth.  Muscle cramps.  Dark pee (urine). Pee may be the color of tea.  Your body making less pee.  Your eyes making fewer tears.  Heartbeat that is uneven or faster than normal (palpitations).  Headache.  Light-headedness, especially when you stand up from sitting.  Fainting (syncope). Symptoms of very bad dehydration may include:  Changes in skin, such as: ? Cold and clammy skin. ? Blotchy (mottled) or pale skin. ? Skin that does not quickly return to normal after being lightly pinched and let go (poor skin turgor).  Changes in body fluids, such as: ? Feeling very thirsty. ? Your eyes making fewer tears. ? Not sweating when body temperature is high, such as in hot weather. ? Your body making very little pee.  Changes in vital signs, such as: ? Weak pulse. ? Pulse that is more than 100 beats a minute when you are sitting still. ? Fast breathing. ? Low blood pressure.  Other changes, such as: ? Sunken eyes. ? Cold hands and feet. ? Confusion. ? Lack of energy (lethargy). ? Trouble waking up from sleep. ? Short-term weight loss. ? Unconsciousness. Follow these instructions at home:  If told by your doctor, drink an ORS: ? Make an ORS by using instructions on the package. ? Start by drinking small amounts, about  cup (120 mL) every 5-10 minutes. ? Slowly drink more until you have had the amount that your doctor said to have.  Drink enough clear fluid to keep your pee clear or pale yellow. If you were told to drink an ORS, finish the ORS  first, then start slowly drinking clear fluids. Drink fluids such as: ? Water. Do not drink only water by itself. Doing that can make the salt (sodium) level in your body get too low (hyponatremia). ? Ice chips. ? Fruit juice that you have added water to (diluted). ? Low-calorie sports drinks.  Avoid: ? Alcohol. ? Drinks that have a lot of sugar. These include high-calorie sports drinks, fruit juice that does not have water added, and soda. ? Caffeine. ? Foods that are greasy or have a lot of fat or sugar.  Take over-the-counter and prescription medicines only as told by your doctor.  Do not take salt tablets. Doing that can make the salt level in your body get too high (hypernatremia).  Eat foods that have minerals (electrolytes). Examples include bananas, oranges, potatoes, tomatoes, and spinach.  Keep all follow-up visits as told by your doctor. This is important. Contact a doctor if:  You have belly (abdominal) pain that: ? Gets worse. ? Stays in one area (localizes).  You have a rash.  You have a stiff neck.  You get angry or annoyed more easily than normal (irritability).  You are more sleepy than normal.  You have a harder time waking up than normal.  You feel: ? Weak. ? Dizzy. ? Very thirsty.  You have peed (urinated) only a small amount of very dark pee during 6-8 hours. Get help right away if:  You have symptoms of   very bad dehydration.  You cannot drink fluids without throwing up (vomiting).  Your symptoms get worse with treatment.  You have a fever.  You have a very bad headache.  You are throwing up or having watery poop (diarrhea) and it: ? Gets worse. ? Does not go away.  You have blood or something green (bile) in your throw-up.  You have blood in your poop (stool). This may cause poop to look black and tarry.  You have not peed in 6-8 hours.  You pass out (faint).  Your heart rate when you are sitting still is more than 100 beats a  minute.  You have trouble breathing. This information is not intended to replace advice given to you by your health care provider. Make sure you discuss any questions you have with your health care provider. Document Released: 06/27/2009 Document Revised: 03/20/2016 Document Reviewed: 10/25/2015 Elsevier Interactive Patient Education  2018 Elsevier Inc.  

## 2018-06-28 NOTE — Telephone Encounter (Signed)
Attempted to call Erin Kent. Voice mail not set up.

## 2018-06-29 ENCOUNTER — Other Ambulatory Visit: Payer: Self-pay

## 2018-06-29 ENCOUNTER — Encounter (HOSPITAL_BASED_OUTPATIENT_CLINIC_OR_DEPARTMENT_OTHER): Payer: Self-pay | Admitting: *Deleted

## 2018-06-29 ENCOUNTER — Telehealth: Payer: Self-pay | Admitting: Hematology and Oncology

## 2018-06-29 ENCOUNTER — Ambulatory Visit
Admission: RE | Admit: 2018-06-29 | Discharge: 2018-06-29 | Disposition: A | Discharge status: Home / Self Care | Payer: Medicaid Other | Source: Ambulatory Visit | Attending: Radiation Oncology | Admitting: Radiation Oncology

## 2018-06-29 DIAGNOSIS — Z510 Encounter for antineoplastic radiation therapy: Secondary | ICD-10-CM | POA: Diagnosis not present

## 2018-06-29 MED ORDER — MAGNESIUM OXIDE 400 (241.3 MG) MG PO TABS: 400.0000 mg | ORAL_TABLET | Freq: Two times a day (BID) | ORAL | 1 refills | Status: DC

## 2018-06-29 NOTE — Progress Notes (Signed)
Spoke w/ pt caregiver/friend, fannie, via phone for pre-op interview.  Npo after mn.  Arrive at 0530.  Will take imodium am dos w/ sips of water.  Current lab results dated 06-28-2018 in chart and epic.

## 2018-06-29 NOTE — Telephone Encounter (Signed)
Appts scheduled LMVM with date/time per 10/16 sch msg

## 2018-06-29 NOTE — Telephone Encounter (Signed)
Called Erin Kent. Per Dr. Alvy Bimler, labs are okay. Instructed to take Magnesium 400 mg 2 x a day. She verbalized understanding. [name] will stop by the office this morning to discuss appts.

## 2018-06-29 NOTE — Telephone Encounter (Signed)
Erin Kent to office. She would like IVF's every Tuesday and Friday starting next week. Scheduling message sent for labs and IVF's on Tuesday and Friday and labs every Tuesday for 4 weeks.

## 2018-06-30 ENCOUNTER — Ambulatory Visit
Admission: RE | Admit: 2018-06-30 | Discharge: 2018-06-30 | Disposition: A | Discharge status: Home / Self Care | Payer: Medicaid Other | Source: Ambulatory Visit | Attending: Radiation Oncology | Admitting: Radiation Oncology

## 2018-06-30 DIAGNOSIS — Z510 Encounter for antineoplastic radiation therapy: Secondary | ICD-10-CM | POA: Diagnosis not present

## 2018-07-01 ENCOUNTER — Ambulatory Visit
Admission: RE | Admit: 2018-07-01 | Discharge: 2018-07-01 | Disposition: A | Discharge status: Home / Self Care | Payer: Medicaid Other | Source: Ambulatory Visit | Attending: Radiation Oncology | Admitting: Radiation Oncology

## 2018-07-01 DIAGNOSIS — Z510 Encounter for antineoplastic radiation therapy: Secondary | ICD-10-CM | POA: Diagnosis not present

## 2018-07-03 NOTE — Anesthesia Preprocedure Evaluation (Addendum)
Anesthesia Evaluation  Patient identified by MRN, date of birth, ID band Patient awake    Reviewed: Allergy & Precautions, H&P , NPO status , Patient's Chart, lab work & pertinent test results  Airway Mallampati: I  TM Distance: >3 FB Neck ROM: Full    Dental no notable dental hx. (+) Teeth Intact, Dental Advisory Given   Pulmonary neg pulmonary ROS,    Pulmonary exam normal breath sounds clear to auscultation       Cardiovascular negative cardio ROS Normal cardiovascular exam Rhythm:Regular Rate:Normal     Neuro/Psych Depression negative neurological ROS     GI/Hepatic negative GI ROS, Neg liver ROS, cachexia   Endo/Other  negative endocrine ROS  Renal/GU negative Renal ROS     Musculoskeletal negative musculoskeletal ROS (+)   Abdominal   Peds  Hematology  (+) Blood dyscrasia, anemia ,   Anesthesia Other Findings   Reproductive/Obstetrics                          Lab Results  Component Value Date   WBC 7.0 06/28/2018   HGB 9.8 (L) 06/28/2018   HCT 31.2 (L) 06/28/2018   MCV 86.2 06/28/2018   PLT 172 06/28/2018    Anesthesia Physical Anesthesia Plan  ASA: III  Anesthesia Plan: General   Post-op Pain Management:    Induction: Intravenous  PONV Risk Score and Plan: 3 and Treatment may vary due to age or medical condition and Ondansetron  Airway Management Planned: LMA  Additional Equipment:   Intra-op Plan:   Post-operative Plan:   Informed Consent: I have reviewed the patients History and Physical, chart, labs and discussed the procedure including the risks, benefits and alternatives for the proposed anesthesia with the patient or authorized representative who has indicated his/her understanding and acceptance.   Dental advisory given  Plan Discussed with: CRNA  Anesthesia Plan Comments:        Anesthesia Quick Evaluation

## 2018-07-04 ENCOUNTER — Encounter (HOSPITAL_BASED_OUTPATIENT_CLINIC_OR_DEPARTMENT_OTHER)
Admission: RE | Disposition: A | Discharge status: Other Acute Inpatient Hospital | Payer: Self-pay | Source: Ambulatory Visit | Attending: Radiation Oncology

## 2018-07-04 ENCOUNTER — Ambulatory Visit (HOSPITAL_COMMUNITY)
Admission: RE | Admit: 2018-07-04 | Discharge: 2018-07-04 | Disposition: A | Discharge status: Home / Self Care | Payer: Medicaid Other | Source: Ambulatory Visit | Attending: Hematology and Oncology | Admitting: Hematology and Oncology

## 2018-07-04 ENCOUNTER — Ambulatory Visit (HOSPITAL_BASED_OUTPATIENT_CLINIC_OR_DEPARTMENT_OTHER)
Admission: RE | Admit: 2018-07-04 | Discharge: 2018-07-04 | Disposition: A | Discharge status: Other Acute Inpatient Hospital | Payer: Medicaid Other | Source: Ambulatory Visit | Attending: Radiation Oncology | Admitting: Radiation Oncology

## 2018-07-04 ENCOUNTER — Ambulatory Visit (HOSPITAL_BASED_OUTPATIENT_CLINIC_OR_DEPARTMENT_OTHER): Payer: Medicaid Other | Admitting: Anesthesiology

## 2018-07-04 ENCOUNTER — Other Ambulatory Visit (HOSPITAL_BASED_OUTPATIENT_CLINIC_OR_DEPARTMENT_OTHER): Payer: Self-pay | Admitting: Radiation Oncology

## 2018-07-04 ENCOUNTER — Ambulatory Visit
Admission: RE | Admit: 2018-07-04 | Discharge: 2018-07-04 | Disposition: A | Discharge status: Home / Self Care | Payer: Medicaid Other | Source: Ambulatory Visit | Attending: Radiation Oncology | Admitting: Radiation Oncology

## 2018-07-04 ENCOUNTER — Encounter (HOSPITAL_BASED_OUTPATIENT_CLINIC_OR_DEPARTMENT_OTHER): Payer: Self-pay

## 2018-07-04 DIAGNOSIS — M8580 Other specified disorders of bone density and structure, unspecified site: Secondary | ICD-10-CM | POA: Insufficient documentation

## 2018-07-04 DIAGNOSIS — N898 Other specified noninflammatory disorders of vagina: Secondary | ICD-10-CM | POA: Diagnosis not present

## 2018-07-04 DIAGNOSIS — F329 Major depressive disorder, single episode, unspecified: Secondary | ICD-10-CM | POA: Insufficient documentation

## 2018-07-04 DIAGNOSIS — C539 Malignant neoplasm of cervix uteri, unspecified: Secondary | ICD-10-CM | POA: Insufficient documentation

## 2018-07-04 DIAGNOSIS — C801 Malignant (primary) neoplasm, unspecified: Secondary | ICD-10-CM

## 2018-07-04 HISTORY — DX: Hypomagnesemia: E83.42

## 2018-07-04 HISTORY — DX: Major depressive disorder, single episode, unspecified: F32.9

## 2018-07-04 HISTORY — DX: Other pancytopenia: D61.818

## 2018-07-04 HISTORY — DX: Personal history of antineoplastic chemotherapy: Z92.21

## 2018-07-04 HISTORY — DX: Personal history of irradiation: Z92.3

## 2018-07-04 HISTORY — DX: Anorexia: R63.0

## 2018-07-04 HISTORY — PX: TANDEM RING INSERTION: SHX6199

## 2018-07-04 HISTORY — DX: Malignant neoplasm of cervix uteri, unspecified: C53.9

## 2018-07-04 SURGERY — INSERTION, UTERINE TANDEM AND RING OR CYLINDER, FOR BRACHYTHERAPY
Anesthesia: General | Site: Vagina

## 2018-07-04 MED ORDER — SODIUM CHLORIDE 0.9 % IR SOLN
Status: DC | PRN
  Administered 2018-07-04: 200 mL via INTRAVESICAL

## 2018-07-04 MED ORDER — KETOROLAC TROMETHAMINE 15 MG/ML IJ SOLN
15.0000 mg | Freq: Once | INTRAMUSCULAR | Status: DC | PRN
  Filled 2018-07-04: qty 1

## 2018-07-04 MED ORDER — NALOXONE HCL 0.4 MG/ML IJ SOLN
INTRAMUSCULAR | Status: AC
  Filled 2018-07-04: qty 1

## 2018-07-04 MED ORDER — LIDOCAINE 2% (20 MG/ML) 5 ML SYRINGE
INTRAMUSCULAR | Status: DC | PRN
  Administered 2018-07-04: 100 mg via INTRAVENOUS

## 2018-07-04 MED ORDER — ACETAMINOPHEN 500 MG PO TABS
ORAL_TABLET | ORAL | Status: AC
  Filled 2018-07-04: qty 1

## 2018-07-04 MED ORDER — GABAPENTIN 100 MG PO CAPS
200.0000 mg | ORAL_CAPSULE | Freq: Once | ORAL | Status: AC
  Administered 2018-07-04: 200 mg via ORAL
  Filled 2018-07-04 (×2): qty 2

## 2018-07-04 MED ORDER — PROPOFOL 10 MG/ML IV BOLUS
INTRAVENOUS | Status: DC | PRN
  Administered 2018-07-04: 40 mg via INTRAVENOUS
  Administered 2018-07-04: 50 mg via INTRAVENOUS

## 2018-07-04 MED ORDER — ACETAMINOPHEN 500 MG PO TABS
500.0000 mg | ORAL_TABLET | Freq: Once | ORAL | Status: AC
  Administered 2018-07-04: 500 mg via ORAL
  Filled 2018-07-04: qty 1

## 2018-07-04 MED ORDER — DEXAMETHASONE SODIUM PHOSPHATE 10 MG/ML IJ SOLN
INTRAMUSCULAR | Status: AC
  Filled 2018-07-04: qty 1

## 2018-07-04 MED ORDER — LIDOCAINE 2% (20 MG/ML) 5 ML SYRINGE
INTRAMUSCULAR | Status: AC
  Filled 2018-07-04: qty 5

## 2018-07-04 MED ORDER — KETOROLAC TROMETHAMINE 30 MG/ML IJ SOLN
INTRAMUSCULAR | Status: DC | PRN
  Administered 2018-07-04: 30 mg via INTRAVENOUS

## 2018-07-04 MED ORDER — FENTANYL CITRATE (PF) 100 MCG/2ML IJ SOLN
25.0000 ug | INTRAMUSCULAR | Status: DC | PRN
  Filled 2018-07-04: qty 1

## 2018-07-04 MED ORDER — ONDANSETRON HCL 4 MG/2ML IJ SOLN
4.0000 mg | Freq: Once | INTRAMUSCULAR | Status: DC | PRN
  Filled 2018-07-04: qty 2

## 2018-07-04 MED ORDER — FENTANYL CITRATE (PF) 100 MCG/2ML IJ SOLN
INTRAMUSCULAR | Status: DC | PRN
  Administered 2018-07-04 (×2): 25 ug via INTRAVENOUS

## 2018-07-04 MED ORDER — LACTATED RINGERS IV SOLN
INTRAVENOUS | Status: DC
  Administered 2018-07-04 (×2): via INTRAVENOUS
  Filled 2018-07-04: qty 1000

## 2018-07-04 MED ORDER — KETOROLAC TROMETHAMINE 30 MG/ML IJ SOLN
INTRAMUSCULAR | Status: AC
  Filled 2018-07-04: qty 1

## 2018-07-04 MED ORDER — ONDANSETRON HCL 4 MG/2ML IJ SOLN
INTRAMUSCULAR | Status: DC | PRN
  Administered 2018-07-04: 4 mg via INTRAVENOUS

## 2018-07-04 MED ORDER — LACTATED RINGERS IV SOLN
INTRAVENOUS | Status: DC
  Administered 2018-07-04: 10:00:00 via INTRAVENOUS
  Filled 2018-07-04 (×2): qty 250

## 2018-07-04 MED ORDER — DEXAMETHASONE SODIUM PHOSPHATE 10 MG/ML IJ SOLN
INTRAMUSCULAR | Status: DC | PRN
  Administered 2018-07-04: 5 mg via INTRAVENOUS

## 2018-07-04 MED ORDER — MIDAZOLAM HCL 2 MG/2ML IJ SOLN
INTRAMUSCULAR | Status: AC
  Filled 2018-07-04: qty 2

## 2018-07-04 MED ORDER — ARTIFICIAL TEARS OPHTHALMIC OINT
TOPICAL_OINTMENT | OPHTHALMIC | Status: AC
  Filled 2018-07-04: qty 3.5

## 2018-07-04 MED ORDER — ONDANSETRON HCL 4 MG/2ML IJ SOLN
INTRAMUSCULAR | Status: AC
  Filled 2018-07-04: qty 2

## 2018-07-04 MED ORDER — NALOXONE HCL 0.4 MG/ML IJ SOLN
INTRAMUSCULAR | Status: DC | PRN
  Administered 2018-07-04 (×2): 80 ug via INTRAVENOUS
  Administered 2018-07-04: 40 ug via INTRAVENOUS

## 2018-07-04 MED ORDER — PROPOFOL 10 MG/ML IV BOLUS
INTRAVENOUS | Status: AC
  Filled 2018-07-04: qty 20

## 2018-07-04 MED ORDER — FENTANYL CITRATE (PF) 100 MCG/2ML IJ SOLN
INTRAMUSCULAR | Status: AC
  Filled 2018-07-04: qty 2

## 2018-07-04 SURGICAL SUPPLY — 20 items
BAG URINE DRAINAGE (UROLOGICAL SUPPLIES) ×3 IMPLANT
BNDG CONFORM 2 STRL LF (GAUZE/BANDAGES/DRESSINGS) IMPLANT
CATH FOLEY 2WAY SLVR  5CC 16FR (CATHETERS) ×2
CATH FOLEY 2WAY SLVR 5CC 16FR (CATHETERS) ×1 IMPLANT
DILATOR CANAL MILEX (MISCELLANEOUS) IMPLANT
GAUZE 4X4 16PLY RFD (DISPOSABLE) ×3 IMPLANT
GLOVE BIO SURGEON STRL SZ7.5 (GLOVE) ×6 IMPLANT
GOWN STRL REUS W/TWL LRG LVL3 (GOWN DISPOSABLE) ×3 IMPLANT
HOLDER FOLEY CATH W/STRAP (MISCELLANEOUS) ×3 IMPLANT
HOVERMATT SINGLE USE (MISCELLANEOUS) ×3 IMPLANT
IV NS 1000ML (IV SOLUTION) ×3
IV NS 1000ML BAXH (IV SOLUTION) ×1 IMPLANT
KIT TURNOVER CYSTO (KITS) ×3 IMPLANT
PACK VAGINAL MINOR WOMEN LF (CUSTOM PROCEDURE TRAY) ×3 IMPLANT
PACKING VAGINAL (PACKING) IMPLANT
PAD ABD 8X10 STRL (GAUZE/BANDAGES/DRESSINGS) ×3 IMPLANT
PAD OB MATERNITY 4.3X12.25 (PERSONAL CARE ITEMS) IMPLANT
SET IRRIG Y TYPE TUR BLADDER L (SET/KITS/TRAYS/PACK) ×3 IMPLANT
TOWEL OR 17X24 6PK STRL BLUE (TOWEL DISPOSABLE) ×6 IMPLANT
WATER STERILE IRR 500ML POUR (IV SOLUTION) ×3 IMPLANT

## 2018-07-04 NOTE — Anesthesia Procedure Notes (Signed)
Procedure Name: LMA Insertion Date/Time: 07/04/2018 7:37 AM Performed by: Wanita Chamberlain, CRNA Pre-anesthesia Checklist: Patient identified, Emergency Drugs available, Suction available, Patient being monitored and Timeout performed Patient Re-evaluated:Patient Re-evaluated prior to induction Oxygen Delivery Method: Circle system utilized Preoxygenation: Pre-oxygenation with 100% oxygen Induction Type: IV induction Ventilation: Mask ventilation without difficulty LMA: LMA inserted LMA Size: 3.0 Number of attempts: 1 Placement Confirmation: breath sounds checked- equal and bilateral,  CO2 detector and positive ETCO2 Tube secured with: Tape Dental Injury: Teeth and Oropharynx as per pre-operative assessment

## 2018-07-04 NOTE — Op Note (Signed)
07/04/2018  9:56 AM  PATIENT:  Erin Kent  67 y.o. female  PRE-OPERATIVE DIAGNOSIS:  cervical cancer  POST-OPERATIVE DIAGNOSIS:  cervical cancer  PROCEDURE:  Procedure(s): TANDEM RING INSERTION (N/A)  SURGEON:  Surgeon(s) and Role:    * Gery Pray, MD - Primary  PHYSICIAN ASSISTANT:   ASSISTANTS: none   ANESTHESIA:   general  EBL:  0 mL   BLOOD ADMINISTERED:none  DRAINS: Urinary Catheter (Foley)   LOCAL MEDICATIONS USED:  NONE  SPECIMEN:  No Specimen  DISPOSITION OF SPECIMEN:  N/A  COUNTS:  YES  TOURNIQUET:  * No tourniquets in log *  DICTATION: the patient was taken to outpatient room #1. A general anesthetic was applied. Patient was prepped and draped in the usual sterile fashion and placed in the dorsolithotomy position. A time out for the procedure,  anticipated length of the procedure and preoperative medications was performed prior to initiation of the procedure. Exam under anesthesia showed excellent response to the patient's prior external beam and radiosensitizing chemotherapy. The cervix was essentially normal in size. Very little bleeding with the procedure today. Review of the vaginal vault revealed no obvious residual tumor along the anterior distal vaginal cavity. Patient did have a Foley catheter placed and this was used for ultrasound imaging. The Foley catheter was back filled with approximately 200 mL of sterile water. Good imaging was obtained. The uterus was noted to be quite small and mildly anteverted. The uterus was estimated to be approximately 5 cm in length. Patient proceeded to undergo dilation of the cervical os. Given the narrowed vaginal cavity patient could not have placement of the standard ring for high-dose rate radiation therapy. Was decided that point that the patient should be treated with a tandem cylinder apparatus. Patient had a 15 40 mm tandem placed within the cavity showing accurate placement within the endometrial canal. Patient  then had a 2.0 cm diameter cylinder attached to the tandem. Initially a 2.5 cm diameter cylinder was attempted to be placed but given the narrowed vaginal vault this did not achieve good position within the vaginal cavity. Patient tolerated the procedure well and subsequently transported to the recovery room in stable condition. Later today the patient will undergo planning and her second high-dose-rate treatment directed at the cervix.  PLAN OF CARE: transferred to radiation oncology for planning and treatment  PATIENT DISPOSITION:  PACU - hemodynamically stable.   Delay start of Pharmacological VTE agent (>24hrs) due to surgical blood loss or risk of bleeding: not applicable

## 2018-07-04 NOTE — Transfer of Care (Signed)
Immediate Anesthesia Transfer of Care Note  Patient: Erin Kent  Procedure(s) Performed: TANDEM RING INSERTION (N/A Vagina )  Patient Location: PACU  Anesthesia Type:General  Level of Consciousness: awake, alert , oriented and patient cooperative  Airway & Oxygen Therapy: Patient Spontanous Breathing and Patient connected to nasal cannula oxygen  Post-op Assessment: Report given to RN and Post -op Vital signs reviewed and stable  Post vital signs: Reviewed and stable  Last Vitals:  Vitals Value Taken Time  BP 137/73 07/04/2018  9:00 AM  Temp    Pulse 59 07/04/2018  9:10 AM  Resp 20 07/04/2018  9:10 AM  SpO2 97 % 07/04/2018  9:10 AM  Vitals shown include unvalidated device data.  Last Pain:  Vitals:   07/04/18 0900  TempSrc:   PainSc: 0-No pain      Patients Stated Pain Goal: 3 (80/16/55 3748)  Complications: No apparent anesthesia complications

## 2018-07-04 NOTE — Progress Notes (Signed)
PIV removed from [facility] without difficulty, catheter tip intact. Pt tolerated well. Foley catheter removed per Dr. Sondra Come, pt tolerated well. Segmented cervical applicator device removed per Dr. Sondra Come with good tolerance. Pt assisted with dressing. Pt transported to front door in [place]. Pt discharged to caregiver, [name], in no apparent distress.  Loma Sousa, RN BSN

## 2018-07-04 NOTE — Progress Notes (Signed)
  Radiation Oncology         [phone]) [phone] ________________________________  Name: Erin Kent MRN: [id number]  Date: 07/04/2018  DOB: 11/29/1950  SIMULATION AND TREATMENT PLANNING NOTE HDR BRACHYTHERAPY  DIAGNOSIS:  Stage IIIC1,invasive squamous cell carcinoma to cervix(FIGO 2018 Staging)  NARRATIVE:  The patient was brought to the [place].  Identity was confirmed.  All relevant records and images related to the planned course of therapy were reviewed.  The patient freely provided informed written consent to proceed with treatment after reviewing the details related to the planned course of therapy. The consent form was witnessed and verified by the simulation staff.  Then, the patient was set-up in a stable reproducible  supine position for radiation therapy.  CT images were obtained.  Surface markings were placed.  The CT images were loaded into the planning software.  Then the target and avoidance structures were contoured.  Treatment planning then occurred.  The radiation prescription was entered and confirmed.   I have requested : Brachytherapy Isodose Plan and Dosimetry Calculations to plan the radiation distribution.    PLAN:  The patient will receive 5.5 Gy in 1 fraction. tHe patient will be treated with the tandem cylinder system. Iridium 192 will be the high-dose-rate source    ________________________________  Blair Promise, PhD, MD  This document serves as a record of services personally performed by Gery Pray, MD. It was created on his behalf by Mary-Margaret Loma Messing, a trained medical scribe. The creation of this record is based on the scribe's personal observations and the provider's statements to them. This document has been checked and approved by the attending provider.

## 2018-07-04 NOTE — Addendum Note (Signed)
Encounter addended by: Loma Sousa, RN on: 07/04/2018 10:39 AM  Actions taken: Sign clinical note

## 2018-07-04 NOTE — Addendum Note (Signed)
Encounter addended by: Loma Sousa, RN on: 07/04/2018 2:46 PM  Actions taken: LDA properties accepted, MAR administration edited, Sign clinical note

## 2018-07-04 NOTE — Anesthesia Postprocedure Evaluation (Signed)
Anesthesia Post Note  Patient: Erin Kent  Procedure(s) Performed: TANDEM RING INSERTION (N/A Vagina )     Patient location during evaluation: PACU Anesthesia Type: General Level of consciousness: awake and alert Pain management: pain level controlled Vital Signs Assessment: post-procedure vital signs reviewed and stable Respiratory status: spontaneous breathing, nonlabored ventilation, respiratory function stable and patient connected to nasal cannula oxygen Cardiovascular status: blood pressure returned to baseline and stable Postop Assessment: no apparent nausea or vomiting Anesthetic complications: no    Last Vitals:  Vitals:   07/04/18 0900 07/04/18 0915  BP: 137/73 (!) 141/78  Pulse: (!) 59 (!) 59  Resp: 16 14  Temp:  (!) 35.6 C  SpO2: 100% 97%    Last Pain:  Vitals:   07/04/18 0930  TempSrc:   PainSc: 0-No pain                 Barnet Glasgow

## 2018-07-04 NOTE — Interval H&P Note (Signed)
History and Physical Interval Note:  07/04/2018 7:24 AM  Erin Kent  has presented today for surgery, with the diagnosis of cervical cancer  The various methods of treatment have been discussed with the patient and family. After consideration of risks, benefits and other options for treatment, the patient has consented to  Procedure(s): TANDEM RING INSERTION (N/A) as a surgical intervention .  The patient's history has been reviewed, patient examined, no change in status, stable for surgery.  I have reviewed the patient's chart and labs.  Questions were answered to the patient's satisfaction.     Gery Pray

## 2018-07-04 NOTE — Progress Notes (Signed)
  Radiation Oncology         [phone] ________________________________  Name: Erin Kent MRN: [id number]  Date: 07/04/2018  DOB: [birth year]  CC: Patient, [name]  Isabel Caprice, MD  HDR BRACHYTHERAPY NOTE  DIAGNOSIS: Stage IIIC1,invasive squamous cell carcinoma to cervix(FIGO 2018 Staging)  NARRATIVE: The patient was brought to the [place] suite. Identity was confirmed. All relevant records and images related to the planned course of therapy were reviewed. The patient freely provided informed written consent to proceed with treatment after reviewing the details related to the planned course of therapy. The consent form was witnessed and verified by the simulation staff. Then, the patient was set-up in a stable reproducible supine position for radiation therapy. The tandem cylinder system was accessed and fiducial markers were placed within the tandem and cylinder.   Simple treatment device note: On the operating room the patient had construction of her custom tandem cylinder system. She will be treated with a 15 tandem/cylinder system. The patient had placement of a 2.0 cm cylinder.   Verification simulation note: An AP and lateral film was obtained through the pelvis area. This was compared to the patient's planning films documenting accurate position of the tandem/cylinder system for treatment.  High-dose-rate brachytherapy treatment note:  The remote afterloading device was accessed through catheter system and attached to the tandem cylinder system. Patient then proceeded to undergo her second high-dose-rate treatment directed at the cervix. The patient was prescribed a dose of 5.5 gray to be delivered to the [place]. Patient was treated with 1 channels using 14 dwell positions. Treatment time was 128.90 seconds. The patient tolerated the procedure well. After completion of her therapy, a radiation survey was performed documenting return of the iridium source into the GammaMed  safe. The patient was then transferred to the nursing suite. She then had removal of  the tandem and cylinder system. The patient tolerated the removal well.  PLAN: Patient will return next week for her third high-dose-rate treatment. ________________________________  Blair Promise, PhD, MD  This document serves as a record of services personally performed by Gery Pray, MD. It was created on his behalf by [facility], a trained medical scribe. The creation of this record is based on the scribe's personal observations and the provider's statements to them. This document has been checked and approved by the attending provider.

## 2018-07-04 NOTE — Progress Notes (Signed)
Pt transported from [facility] OP PACU to CT simulation. Pt talkative and comfortable. Pt denies c/o pain at this time. Pt received from CT simulation without c/o pain. Pt consumed 100% breakfast. IVF infusing via PIV in LAC. VSS. Foley patent with pale yellow urine. Pt resting without any s/s distress noted. Loma Sousa, RN BSN

## 2018-07-04 NOTE — Discharge Instructions (Signed)
NO ADVIL, ALEVE, MOTRIN, IBUPROFEN UNTIL 2 PM TODAY    Post Anesthesia Home Care Instructions  Activity: Get plenty of rest for the remainder of the day. A responsible adult should stay with you for 24 hours following the procedure.  For the next 24 hours, DO NOT: -Drive a car -Paediatric nurse -Drink alcoholic beverages -Take any medication unless instructed by your physician -Make any legal decisions or sign important papers.  Meals: Start with liquid foods such as gelatin or soup. Progress to regular foods as tolerated. Avoid greasy, spicy, heavy foods. If nausea and/or vomiting occur, drink only clear liquids until the nausea and/or vomiting subsides. Call your physician if vomiting continues.  Special Instructions/Symptoms: Your throat may feel dry or sore from the anesthesia or the breathing tube placed in your throat during surgery. If this causes discomfort, gargle with warm salt water. The discomfort should disappear within 24 hours.  If you had a scopolamine patch placed behind your ear for the management of post- operative nausea and/or vomiting:  1. The medication in the patch is effective for 72 hours, after which it should be removed.  Wrap patch in a tissue and discard in the trash. Wash hands thoroughly with soap and water. 2. You may remove the patch earlier than 72 hours if you experience unpleasant side effects which may include dry mouth, dizziness or visual disturbances. 3. Avoid touching the patch. Wash your hands with soap and water after contact with the patch.   Call your surgeon if you experience:   1.  Fever over 101.0. 2.  Inability to urinate. 3.  Nausea and/or vomiting. 4.  Extreme swelling or bruising at the surgical site. 5.  Continued bleeding from the incision. 6.  Increased pain, redness or drainage from the incision. 7.  Problems related to your pain medication. 8.  Any problems and/or concerns

## 2018-07-05 ENCOUNTER — Inpatient Hospital Stay: Payer: Medicaid Other

## 2018-07-05 ENCOUNTER — Encounter (HOSPITAL_BASED_OUTPATIENT_CLINIC_OR_DEPARTMENT_OTHER): Payer: Self-pay | Admitting: Radiation Oncology

## 2018-07-05 ENCOUNTER — Telehealth: Payer: Self-pay

## 2018-07-05 VITALS — BP 124/70 | HR 66 | Temp 97.6°F | Resp 16

## 2018-07-05 DIAGNOSIS — C539 Malignant neoplasm of cervix uteri, unspecified: Secondary | ICD-10-CM

## 2018-07-05 DIAGNOSIS — Z5111 Encounter for antineoplastic chemotherapy: Secondary | ICD-10-CM | POA: Diagnosis not present

## 2018-07-05 LAB — COMPREHENSIVE METABOLIC PANEL
ALBUMIN: 3.2 g/dL — AB (ref 3.5–5.0)
ALT: 10 U/L (ref 0–44)
ANION GAP: 8 (ref 5–15)
AST: 12 U/L — ABNORMAL LOW (ref 15–41)
Alkaline Phosphatase: 65 U/L (ref 38–126)
BUN: 14 mg/dL (ref 8–23)
CO2: 33 mmol/L — AB (ref 22–32)
Calcium: 8.6 mg/dL — ABNORMAL LOW (ref 8.9–10.3)
Chloride: 102 mmol/L (ref 98–111)
Creatinine, Ser: 0.71 mg/dL (ref 0.44–1.00)
GFR calc Af Amer: >60 mL/min (ref >60)
GFR calc non Af Amer: >60 mL/min (ref >60)
GLUCOSE: 88 mg/dL (ref 70–99)
POTASSIUM: 3.3 mmol/L — AB (ref 3.5–5.1)
SODIUM: 143 mmol/L (ref 135–145)
TOTAL PROTEIN: 5.9 g/dL — AB (ref 6.5–8.1)
Total Bilirubin: <0.2 mg/dL — ABNORMAL LOW (ref 0.3–1.2)

## 2018-07-05 LAB — CBC WITH DIFFERENTIAL/PLATELET
Abs Immature Granulocytes: 0.11 10*3/uL — ABNORMAL HIGH (ref 0.00–0.07)
BASOS ABS: 0 10*3/uL (ref 0.0–0.1)
Basophils Relative: 0 %
EOS ABS: 0 10*3/uL (ref 0.0–0.5)
EOS PCT: 0 %
HCT: 29 % — ABNORMAL LOW (ref 36.0–46.0)
Hemoglobin: 9.5 g/dL — ABNORMAL LOW (ref 12.0–15.0)
Immature Granulocytes: 3 %
LYMPHS PCT: 5 %
Lymphs Abs: 0.2 10*3/uL — ABNORMAL LOW (ref 0.7–4.0)
MCH: 28.3 pg (ref 26.0–34.0)
MCHC: 32.8 g/dL (ref 30.0–36.0)
MCV: 86.3 fL (ref 80.0–100.0)
Monocytes Absolute: 0.6 10*3/uL (ref 0.1–1.0)
Monocytes Relative: 15 %
NRBC: 0 % (ref 0.0–0.2)
Neutro Abs: 3.3 10*3/uL (ref 1.7–7.7)
Neutrophils Relative %: 77 %
Platelets: 158 10*3/uL (ref 150–400)
RBC: 3.36 MIL/uL — AB (ref 3.87–5.11)
RDW: 26 % — AB (ref 11.5–15.5)
WBC: 4.3 10*3/uL (ref 4.0–10.5)

## 2018-07-05 LAB — MAGNESIUM: Magnesium: 1.9 mg/dL (ref 1.7–2.4)

## 2018-07-05 MED ORDER — HEPARIN SOD (PORK) LOCK FLUSH 100 UNIT/ML IV SOLN
500.0000 [IU] | Freq: Once | INTRAVENOUS | Status: AC
  Administered 2018-07-05: 500 [IU]
  Filled 2018-07-05: qty 5

## 2018-07-05 MED ORDER — SODIUM CHLORIDE 0.9 % IV SOLN
Freq: Once | INTRAVENOUS | Status: AC
  Administered 2018-07-05: 13:00:00 via INTRAVENOUS
  Filled 2018-07-05: qty 250

## 2018-07-05 MED ORDER — SODIUM CHLORIDE 0.9% FLUSH
10.0000 mL | Freq: Once | INTRAVENOUS | Status: AC
  Administered 2018-07-05: 10 mL
  Filled 2018-07-05: qty 10

## 2018-07-05 NOTE — Telephone Encounter (Signed)
-----   Message from [name], MD sent at 07/05/2018  1:22 PM EDT ----- Regarding: labs are better pls call patient. Labs are stable/better ----- Message ----- From: Buel Ream, Lab In [place] Sent: 07/05/2018  12:29 PM EDT To: [name], MD

## 2018-07-05 NOTE — Telephone Encounter (Signed)
Attempted to leave message

## 2018-07-05 NOTE — Patient Instructions (Signed)
Dehydration, Adult Dehydration is when there is not enough fluid or water in your body. This happens when you lose more fluids than you take in. Dehydration can range from mild to very bad. It should be treated right away to keep it from getting very bad. Symptoms of mild dehydration may include:  Thirst.  Dry lips.  Slightly dry mouth.  Dry, warm skin.  Dizziness. Symptoms of moderate dehydration may include:  Very dry mouth.  Muscle cramps.  Dark pee (urine). Pee may be the color of tea.  Your body making less pee.  Your eyes making fewer tears.  Heartbeat that is uneven or faster than normal (palpitations).  Headache.  Light-headedness, especially when you stand up from sitting.  Fainting (syncope). Symptoms of very bad dehydration may include:  Changes in skin, such as: ? Cold and clammy skin. ? Blotchy (mottled) or pale skin. ? Skin that does not quickly return to normal after being lightly pinched and let go (poor skin turgor).  Changes in body fluids, such as: ? Feeling very thirsty. ? Your eyes making fewer tears. ? Not sweating when body temperature is high, such as in hot weather. ? Your body making very little pee.  Changes in vital signs, such as: ? Weak pulse. ? Pulse that is more than 100 beats a minute when you are sitting still. ? Fast breathing. ? Low blood pressure.  Other changes, such as: ? Sunken eyes. ? Cold hands and feet. ? Confusion. ? Lack of energy (lethargy). ? Trouble waking up from sleep. ? Short-term weight loss. ? Unconsciousness. Follow these instructions at home:  If told by your doctor, drink an ORS: ? Make an ORS by using instructions on the package. ? Start by drinking small amounts, about  cup (120 mL) every 5-10 minutes. ? Slowly drink more until you have had the amount that your doctor said to have.  Drink enough clear fluid to keep your pee clear or pale yellow. If you were told to drink an ORS, finish the ORS  first, then start slowly drinking clear fluids. Drink fluids such as: ? Water. Do not drink only water by itself. Doing that can make the salt (sodium) level in your body get too low (hyponatremia). ? Ice chips. ? Fruit juice that you have added water to (diluted). ? Low-calorie sports drinks.  Avoid: ? Alcohol. ? Drinks that have a lot of sugar. These include high-calorie sports drinks, fruit juice that does not have water added, and soda. ? Caffeine. ? Foods that are greasy or have a lot of fat or sugar.  Take over-the-counter and prescription medicines only as told by your doctor.  Do not take salt tablets. Doing that can make the salt level in your body get too high (hypernatremia).  Eat foods that have minerals (electrolytes). Examples include bananas, oranges, potatoes, tomatoes, and spinach.  Keep all follow-up visits as told by your doctor. This is important. Contact a doctor if:  You have belly (abdominal) pain that: ? Gets worse. ? Stays in one area (localizes).  You have a rash.  You have a stiff neck.  You get angry or annoyed more easily than normal (irritability).  You are more sleepy than normal.  You have a harder time waking up than normal.  You feel: ? Weak. ? Dizzy. ? Very thirsty.  You have peed (urinated) only a small amount of very dark pee during 6-8 hours. Get help right away if:  You have symptoms of   very bad dehydration.  You cannot drink fluids without throwing up (vomiting).  Your symptoms get worse with treatment.  You have a fever.  You have a very bad headache.  You are throwing up or having watery poop (diarrhea) and it: ? Gets worse. ? Does not go away.  You have blood or something green (bile) in your throw-up.  You have blood in your poop (stool). This may cause poop to look black and tarry.  You have not peed in 6-8 hours.  You pass out (faint).  Your heart rate when you are sitting still is more than 100 beats a  minute.  You have trouble breathing. This information is not intended to replace advice given to you by your health care provider. Make sure you discuss any questions you have with your health care provider. Document Released: 06/27/2009 Document Revised: 03/20/2016 Document Reviewed: 10/25/2015 Elsevier Interactive Patient Education  2018 Elsevier Inc.  

## 2018-07-07 ENCOUNTER — Encounter (HOSPITAL_BASED_OUTPATIENT_CLINIC_OR_DEPARTMENT_OTHER): Payer: Self-pay | Admitting: *Deleted

## 2018-07-07 ENCOUNTER — Other Ambulatory Visit: Payer: Self-pay

## 2018-07-07 NOTE — Telephone Encounter (Signed)
Called and given below message. She verbalized understanding. 

## 2018-07-07 NOTE — Progress Notes (Signed)
Spoke with patient caregiver [name]) for pre op interview. NPO after MN. No medications AM of surgery. Arrival time 0530.

## 2018-07-08 ENCOUNTER — Other Ambulatory Visit: Payer: Self-pay | Admitting: Hematology and Oncology

## 2018-07-08 ENCOUNTER — Inpatient Hospital Stay: Payer: Medicaid Other

## 2018-07-08 DIAGNOSIS — C801 Malignant (primary) neoplasm, unspecified: Secondary | ICD-10-CM

## 2018-07-08 DIAGNOSIS — C539 Malignant neoplasm of cervix uteri, unspecified: Secondary | ICD-10-CM

## 2018-07-08 DIAGNOSIS — Z5111 Encounter for antineoplastic chemotherapy: Secondary | ICD-10-CM | POA: Diagnosis not present

## 2018-07-08 MED ORDER — SODIUM CHLORIDE 0.9 % IV SOLN
Freq: Once | INTRAVENOUS | Status: AC
  Administered 2018-07-08: 11:00:00 via INTRAVENOUS
  Filled 2018-07-08: qty 250

## 2018-07-08 NOTE — Patient Instructions (Signed)
Dehydration, Adult Dehydration is when there is not enough fluid or water in your body. This happens when you lose more fluids than you take in. Dehydration can range from mild to very bad. It should be treated right away to keep it from getting very bad. Symptoms of mild dehydration may include:  Thirst.  Dry lips.  Slightly dry mouth.  Dry, warm skin.  Dizziness. Symptoms of moderate dehydration may include:  Very dry mouth.  Muscle cramps.  Dark pee (urine). Pee may be the color of tea.  Your body making less pee.  Your eyes making fewer tears.  Heartbeat that is uneven or faster than normal (palpitations).  Headache.  Light-headedness, especially when you stand up from sitting.  Fainting (syncope). Symptoms of very bad dehydration may include:  Changes in skin, such as: ? Cold and clammy skin. ? Blotchy (mottled) or pale skin. ? Skin that does not quickly return to normal after being lightly pinched and let go (poor skin turgor).  Changes in body fluids, such as: ? Feeling very thirsty. ? Your eyes making fewer tears. ? Not sweating when body temperature is high, such as in hot weather. ? Your body making very little pee.  Changes in vital signs, such as: ? Weak pulse. ? Pulse that is more than 100 beats a minute when you are sitting still. ? Fast breathing. ? Low blood pressure.  Other changes, such as: ? Sunken eyes. ? Cold hands and feet. ? Confusion. ? Lack of energy (lethargy). ? Trouble waking up from sleep. ? Short-term weight loss. ? Unconsciousness. Follow these instructions at home:  If told by your doctor, drink an ORS: ? Make an ORS by using instructions on the package. ? Start by drinking small amounts, about  cup (120 mL) every 5-10 minutes. ? Slowly drink more until you have had the amount that your doctor said to have.  Drink enough clear fluid to keep your pee clear or pale yellow. If you were told to drink an ORS, finish the ORS  first, then start slowly drinking clear fluids. Drink fluids such as: ? Water. Do not drink only water by itself. Doing that can make the salt (sodium) level in your body get too low (hyponatremia). ? Ice chips. ? Fruit juice that you have added water to (diluted). ? Low-calorie sports drinks.  Avoid: ? Alcohol. ? Drinks that have a lot of sugar. These include high-calorie sports drinks, fruit juice that does not have water added, and soda. ? Caffeine. ? Foods that are greasy or have a lot of fat or sugar.  Take over-the-counter and prescription medicines only as told by your doctor.  Do not take salt tablets. Doing that can make the salt level in your body get too high (hypernatremia).  Eat foods that have minerals (electrolytes). Examples include bananas, oranges, potatoes, tomatoes, and spinach.  Keep all follow-up visits as told by your doctor. This is important. Contact a doctor if:  You have belly (abdominal) pain that: ? Gets worse. ? Stays in one area (localizes).  You have a rash.  You have a stiff neck.  You get angry or annoyed more easily than normal (irritability).  You are more sleepy than normal.  You have a harder time waking up than normal.  You feel: ? Weak. ? Dizzy. ? Very thirsty.  You have peed (urinated) only a small amount of very dark pee during 6-8 hours. Get help right away if:  You have symptoms of   very bad dehydration.  You cannot drink fluids without throwing up (vomiting).  Your symptoms get worse with treatment.  You have a fever.  You have a very bad headache.  You are throwing up or having watery poop (diarrhea) and it: ? Gets worse. ? Does not go away.  You have blood or something green (bile) in your throw-up.  You have blood in your poop (stool). This may cause poop to look black and tarry.  You have not peed in 6-8 hours.  You pass out (faint).  Your heart rate when you are sitting still is more than 100 beats a  minute.  You have trouble breathing. This information is not intended to replace advice given to you by your health care provider. Make sure you discuss any questions you have with your health care provider. Document Released: 06/27/2009 Document Revised: 03/20/2016 Document Reviewed: 10/25/2015 Elsevier Interactive Patient Education  2018 Elsevier Inc.  

## 2018-07-11 ENCOUNTER — Ambulatory Visit: Payer: Medicaid Other | Admitting: Radiation Oncology

## 2018-07-12 ENCOUNTER — Inpatient Hospital Stay: Payer: Medicaid Other

## 2018-07-12 ENCOUNTER — Encounter: Payer: Self-pay | Admitting: Nutrition

## 2018-07-12 ENCOUNTER — Telehealth: Payer: Self-pay | Admitting: Hematology and Oncology

## 2018-07-12 DIAGNOSIS — C539 Malignant neoplasm of cervix uteri, unspecified: Secondary | ICD-10-CM

## 2018-07-12 DIAGNOSIS — Z5111 Encounter for antineoplastic chemotherapy: Secondary | ICD-10-CM | POA: Diagnosis not present

## 2018-07-12 LAB — CBC WITH DIFFERENTIAL/PLATELET
Abs Immature Granulocytes: 0.07 10*3/uL (ref 0.00–0.07)
BASOS ABS: 0 10*3/uL (ref 0.0–0.1)
BASOS PCT: 0 %
EOS PCT: 0 %
Eosinophils Absolute: 0 10*3/uL (ref 0.0–0.5)
HCT: 32.4 % — ABNORMAL LOW (ref 36.0–46.0)
Hemoglobin: 10.6 g/dL — ABNORMAL LOW (ref 12.0–15.0)
Immature Granulocytes: 1 %
LYMPHS PCT: 3 %
Lymphs Abs: 0.2 10*3/uL — ABNORMAL LOW (ref 0.7–4.0)
MCH: 28.8 pg (ref 26.0–34.0)
MCHC: 32.7 g/dL (ref 30.0–36.0)
MCV: 88 fL (ref 80.0–100.0)
MONO ABS: 0.9 10*3/uL (ref 0.1–1.0)
Monocytes Relative: 15 %
NEUTROS ABS: 5.1 10*3/uL (ref 1.7–7.7)
NRBC: 0 % (ref 0.0–0.2)
Neutrophils Relative %: 81 %
PLATELETS: 103 10*3/uL — AB (ref 150–400)
RBC: 3.68 MIL/uL — AB (ref 3.87–5.11)
RDW: 25.8 % — ABNORMAL HIGH (ref 11.5–15.5)
WBC: 6.3 10*3/uL (ref 4.0–10.5)

## 2018-07-12 LAB — COMPREHENSIVE METABOLIC PANEL
ALT: 13 U/L (ref 0–44)
ANION GAP: 9 (ref 5–15)
AST: 13 U/L — ABNORMAL LOW (ref 15–41)
Albumin: 3.7 g/dL (ref 3.5–5.0)
Alkaline Phosphatase: 71 U/L (ref 38–126)
BUN: 14 mg/dL (ref 8–23)
CALCIUM: 9.1 mg/dL (ref 8.9–10.3)
CHLORIDE: 101 mmol/L (ref 98–111)
CO2: 32 mmol/L (ref 22–32)
Creatinine, Ser: 0.8 mg/dL (ref 0.44–1.00)
GFR calc non Af Amer: >60 mL/min (ref >60)
GLUCOSE: 110 mg/dL — AB (ref 70–99)
POTASSIUM: 3 mmol/L — AB (ref 3.5–5.1)
SODIUM: 142 mmol/L (ref 135–145)
Total Bilirubin: 0.3 mg/dL (ref 0.3–1.2)
Total Protein: 6.9 g/dL (ref 6.5–8.1)

## 2018-07-12 LAB — MAGNESIUM: MAGNESIUM: 2.1 mg/dL (ref 1.7–2.4)

## 2018-07-12 MED ORDER — HEPARIN SOD (PORK) LOCK FLUSH 100 UNIT/ML IV SOLN
500.0000 [IU] | Freq: Once | INTRAVENOUS | Status: AC
  Administered 2018-07-12: 500 [IU]
  Filled 2018-07-12: qty 5

## 2018-07-12 MED ORDER — SODIUM CHLORIDE 0.9% FLUSH
3.0000 mL | Freq: Once | INTRAVENOUS | Status: DC | PRN
  Filled 2018-07-12: qty 10

## 2018-07-12 MED ORDER — ALTEPLASE 2 MG IJ SOLR
2.0000 mg | Freq: Once | INTRAMUSCULAR | Status: DC | PRN
  Filled 2018-07-12: qty 2

## 2018-07-12 MED ORDER — SODIUM CHLORIDE 0.9% FLUSH
10.0000 mL | Freq: Once | INTRAVENOUS | Status: DC
  Filled 2018-07-12: qty 10

## 2018-07-12 MED ORDER — SODIUM CHLORIDE 0.9 % IV SOLN
Freq: Once | INTRAVENOUS | Status: DC
  Filled 2018-07-12: qty 250

## 2018-07-12 MED ORDER — SODIUM CHLORIDE 0.9% FLUSH
10.0000 mL | Freq: Once | INTRAVENOUS | Status: AC | PRN
  Administered 2018-07-12: 10 mL
  Filled 2018-07-12: qty 10

## 2018-07-12 MED ORDER — SODIUM CHLORIDE 0.9 % IV SOLN
Freq: Once | INTRAVENOUS | Status: AC
  Administered 2018-07-12: 15:00:00 via INTRAVENOUS
  Filled 2018-07-12: qty 250

## 2018-07-12 MED ORDER — HEPARIN SOD (PORK) LOCK FLUSH 100 UNIT/ML IV SOLN
500.0000 [IU] | Freq: Once | INTRAVENOUS | Status: DC | PRN
  Filled 2018-07-12: qty 5

## 2018-07-12 MED ORDER — HEPARIN SOD (PORK) LOCK FLUSH 100 UNIT/ML IV SOLN
250.0000 [IU] | Freq: Once | INTRAVENOUS | Status: DC | PRN
  Filled 2018-07-12: qty 5

## 2018-07-12 NOTE — Progress Notes (Signed)
For 40 minutes, nurses and volunteers have walked the building looking for this patient for her appt that she is arrived to. She went to lab but is not in lobby, bathrooms, subway or side hall ways. Attempted only # listed for her and her caregiver friend. I can only assume she is NOT here in the building any more.

## 2018-07-12 NOTE — Telephone Encounter (Signed)
Left message for Erin Kent per 10/29 sch message - scheduled appt per 10/29 sch message.

## 2018-07-13 NOTE — Anesthesia Preprocedure Evaluation (Addendum)
Anesthesia Evaluation  Patient identified by MRN, date of birth, ID bandGeneral Assessment Comment:Pt sleepy, yet answers appropriately  History of Anesthesia Complications Negative for: history of anesthetic complications  Airway Mallampati: II  TM Distance: >3 FB Neck ROM: Full    Dental  (+) Poor Dentition, Chipped, Loose, Caps, Dental Advisory Given   Pulmonary neg pulmonary ROS,    breath sounds clear to auscultation       Cardiovascular negative cardio ROS   Rhythm:Regular Rate:Normal     Neuro/Psych PSYCHIATRIC DISORDERS Depression negative neurological ROS     GI/Hepatic negative GI ROS, Neg liver ROS,   Endo/Other  negative endocrine ROS  Renal/GU    Cervical cancer: chemo, XRT    Musculoskeletal   Abdominal   Peds  Hematology  (+) Blood dyscrasia (Hb 10.6, plt 103k), anemia ,   Anesthesia Other Findings   Reproductive/Obstetrics                            Anesthesia Physical Anesthesia Plan  ASA: III  Anesthesia Plan: General   Post-op Pain Management:    Induction: Intravenous  PONV Risk Score and Plan: 3 and Ondansetron and Dexamethasone  Airway Management Planned: LMA  Additional Equipment:   Intra-op Plan:   Post-operative Plan:   Informed Consent: I have reviewed the patients History and Physical, chart, labs and discussed the procedure including the risks, benefits and alternatives for the proposed anesthesia with the patient or authorized representative who has indicated his/her understanding and acceptance.   Dental advisory given  Plan Discussed with: CRNA and Surgeon  Anesthesia Plan Comments: (Plan routine monitors, GA- LMA oK)        Anesthesia Quick Evaluation

## 2018-07-14 ENCOUNTER — Ambulatory Visit (HOSPITAL_BASED_OUTPATIENT_CLINIC_OR_DEPARTMENT_OTHER): Payer: Medicaid Other | Admitting: Anesthesiology

## 2018-07-14 ENCOUNTER — Ambulatory Visit (HOSPITAL_COMMUNITY)
Admission: RE | Admit: 2018-07-14 | Discharge: 2018-07-14 | Disposition: A | Discharge status: Home / Self Care | Payer: Medicaid Other | Source: Ambulatory Visit | Attending: Hematology and Oncology | Admitting: Hematology and Oncology

## 2018-07-14 ENCOUNTER — Ambulatory Visit (HOSPITAL_BASED_OUTPATIENT_CLINIC_OR_DEPARTMENT_OTHER)
Admission: RE | Admit: 2018-07-14 | Discharge: 2018-07-14 | Disposition: A | Discharge status: Home / Self Care | Payer: Medicaid Other | Source: Ambulatory Visit | Attending: Radiation Oncology | Admitting: Radiation Oncology

## 2018-07-14 ENCOUNTER — Encounter (HOSPITAL_BASED_OUTPATIENT_CLINIC_OR_DEPARTMENT_OTHER): Payer: Self-pay | Admitting: *Deleted

## 2018-07-14 ENCOUNTER — Ambulatory Visit
Admission: RE | Admit: 2018-07-14 | Discharge: 2018-07-14 | Disposition: A | Discharge status: Home / Self Care | Payer: Medicaid Other | Source: Ambulatory Visit | Attending: Radiation Oncology | Admitting: Radiation Oncology

## 2018-07-14 ENCOUNTER — Encounter (HOSPITAL_BASED_OUTPATIENT_CLINIC_OR_DEPARTMENT_OTHER)
Admission: RE | Disposition: A | Discharge status: Home / Self Care | Payer: Self-pay | Source: Ambulatory Visit | Attending: Radiation Oncology

## 2018-07-14 ENCOUNTER — Other Ambulatory Visit: Payer: Self-pay

## 2018-07-14 VITALS — BP 123/71 | HR 60 | Temp 98.1°F | Resp 18

## 2018-07-14 DIAGNOSIS — C539 Malignant neoplasm of cervix uteri, unspecified: Secondary | ICD-10-CM

## 2018-07-14 DIAGNOSIS — Z79899 Other long term (current) drug therapy: Secondary | ICD-10-CM | POA: Insufficient documentation

## 2018-07-14 DIAGNOSIS — N898 Other specified noninflammatory disorders of vagina: Secondary | ICD-10-CM | POA: Diagnosis not present

## 2018-07-14 DIAGNOSIS — F329 Major depressive disorder, single episode, unspecified: Secondary | ICD-10-CM | POA: Insufficient documentation

## 2018-07-14 DIAGNOSIS — C801 Malignant (primary) neoplasm, unspecified: Secondary | ICD-10-CM

## 2018-07-14 DIAGNOSIS — M8580 Other specified disorders of bone density and structure, unspecified site: Secondary | ICD-10-CM | POA: Insufficient documentation

## 2018-07-14 HISTORY — PX: TANDEM RING INSERTION: SHX6199

## 2018-07-14 LAB — POCT HEMOGLOBIN-HEMACUE: HEMOGLOBIN: 10.5 g/dL — AB (ref 12.0–15.0)

## 2018-07-14 SURGERY — INSERTION, UTERINE TANDEM AND RING OR CYLINDER, FOR BRACHYTHERAPY
Anesthesia: General

## 2018-07-14 MED ORDER — FENTANYL CITRATE (PF) 100 MCG/2ML IJ SOLN
INTRAMUSCULAR | Status: AC
  Filled 2018-07-14: qty 2

## 2018-07-14 MED ORDER — DEXAMETHASONE SODIUM PHOSPHATE 10 MG/ML IJ SOLN
INTRAMUSCULAR | Status: AC
  Filled 2018-07-14: qty 1

## 2018-07-14 MED ORDER — MEPERIDINE HCL 25 MG/ML IJ SOLN
6.2500 mg | INTRAMUSCULAR | Status: DC | PRN
  Filled 2018-07-14: qty 1

## 2018-07-14 MED ORDER — LACTATED RINGERS IV SOLN
INTRAVENOUS | Status: DC
  Administered 2018-07-14: 10:00:00 via INTRAVENOUS
  Filled 2018-07-14 (×2): qty 250

## 2018-07-14 MED ORDER — MIDAZOLAM HCL 2 MG/2ML IJ SOLN
0.5000 mg | Freq: Once | INTRAMUSCULAR | Status: DC | PRN
  Filled 2018-07-14: qty 2

## 2018-07-14 MED ORDER — LACTATED RINGERS IV SOLN
INTRAVENOUS | Status: DC
  Administered 2018-07-14: 06:00:00 via INTRAVENOUS
  Filled 2018-07-14: qty 1000

## 2018-07-14 MED ORDER — SODIUM CHLORIDE 0.9 % IR SOLN
Status: DC | PRN
  Administered 2018-07-14: 1000 mL via INTRAVESICAL

## 2018-07-14 MED ORDER — LIDOCAINE 2% (20 MG/ML) 5 ML SYRINGE
INTRAMUSCULAR | Status: AC
  Filled 2018-07-14: qty 5

## 2018-07-14 MED ORDER — ONDANSETRON HCL 4 MG/2ML IJ SOLN
INTRAMUSCULAR | Status: AC
  Filled 2018-07-14: qty 2

## 2018-07-14 MED ORDER — SCOPOLAMINE 1 MG/3DAYS TD PT72
1.0000 | MEDICATED_PATCH | TRANSDERMAL | Status: DC
  Filled 2018-07-14: qty 1

## 2018-07-14 MED ORDER — PROPOFOL 10 MG/ML IV BOLUS
INTRAVENOUS | Status: AC
  Filled 2018-07-14: qty 20

## 2018-07-14 MED ORDER — PROMETHAZINE HCL 25 MG/ML IJ SOLN
6.2500 mg | INTRAMUSCULAR | Status: DC | PRN
  Filled 2018-07-14: qty 1

## 2018-07-14 MED ORDER — EPHEDRINE 5 MG/ML INJ
INTRAVENOUS | Status: AC
  Filled 2018-07-14: qty 10

## 2018-07-14 MED ORDER — SCOPOLAMINE 1 MG/3DAYS TD PT72
MEDICATED_PATCH | TRANSDERMAL | Status: AC
  Filled 2018-07-14: qty 1

## 2018-07-14 MED ORDER — FENTANYL CITRATE (PF) 100 MCG/2ML IJ SOLN
INTRAMUSCULAR | Status: DC | PRN
  Administered 2018-07-14 (×2): 25 ug via INTRAVENOUS

## 2018-07-14 MED ORDER — MIDAZOLAM HCL 2 MG/2ML IJ SOLN
INTRAMUSCULAR | Status: AC
  Filled 2018-07-14: qty 2

## 2018-07-14 MED ORDER — PROPOFOL 10 MG/ML IV BOLUS
INTRAVENOUS | Status: DC | PRN
  Administered 2018-07-14: 120 mg via INTRAVENOUS

## 2018-07-14 MED ORDER — ONDANSETRON HCL 4 MG/2ML IJ SOLN
INTRAMUSCULAR | Status: DC | PRN
  Administered 2018-07-14: 4 mg via INTRAVENOUS

## 2018-07-14 MED ORDER — LIDOCAINE 2% (20 MG/ML) 5 ML SYRINGE
INTRAMUSCULAR | Status: DC | PRN
  Administered 2018-07-14: 30 mg via INTRAVENOUS

## 2018-07-14 MED ORDER — DEXAMETHASONE SODIUM PHOSPHATE 10 MG/ML IJ SOLN
INTRAMUSCULAR | Status: DC | PRN
  Administered 2018-07-14: 4 mg via INTRAVENOUS

## 2018-07-14 MED ORDER — EPHEDRINE SULFATE-NACL 50-0.9 MG/10ML-% IV SOSY
PREFILLED_SYRINGE | INTRAVENOUS | Status: DC | PRN
  Administered 2018-07-14: 5 mg via INTRAVENOUS
  Administered 2018-07-14: 10 mg via INTRAVENOUS

## 2018-07-14 MED ORDER — HYDROMORPHONE HCL 1 MG/ML IJ SOLN
0.2500 mg | INTRAMUSCULAR | Status: DC | PRN
  Filled 2018-07-14: qty 0.5

## 2018-07-14 SURGICAL SUPPLY — 22 items
BAG URINE DRAINAGE (UROLOGICAL SUPPLIES) ×3 IMPLANT
BNDG CONFORM 2 STRL LF (GAUZE/BANDAGES/DRESSINGS) IMPLANT
CATH FOLEY 2WAY SLVR  5CC 16FR (CATHETERS) ×2
CATH FOLEY 2WAY SLVR 5CC 16FR (CATHETERS) ×1 IMPLANT
COVER WAND RF STERILE (DRAPES) ×6 IMPLANT
DILATOR CANAL MILEX (MISCELLANEOUS) IMPLANT
DRSG PAD ABDOMINAL 8X10 ST (GAUZE/BANDAGES/DRESSINGS) ×2 IMPLANT
GAUZE 4X4 16PLY RFD (DISPOSABLE) ×3 IMPLANT
GLOVE BIO SURGEON STRL SZ7.5 (GLOVE) ×6 IMPLANT
GOWN STRL REUS W/TWL LRG LVL3 (GOWN DISPOSABLE) ×3 IMPLANT
HOLDER FOLEY CATH W/STRAP (MISCELLANEOUS) ×3 IMPLANT
HOVERMATT SINGLE USE (MISCELLANEOUS) ×3 IMPLANT
IV NS 1000ML (IV SOLUTION) ×3
IV NS 1000ML BAXH (IV SOLUTION) ×1 IMPLANT
KIT TURNOVER CYSTO (KITS) ×3 IMPLANT
PACK VAGINAL MINOR WOMEN LF (CUSTOM PROCEDURE TRAY) ×3 IMPLANT
PACKING VAGINAL (PACKING) IMPLANT
PAD ABD 8X10 STRL (GAUZE/BANDAGES/DRESSINGS) ×3 IMPLANT
PAD OB MATERNITY 4.3X12.25 (PERSONAL CARE ITEMS) IMPLANT
SET IRRIG Y TYPE TUR BLADDER L (SET/KITS/TRAYS/PACK) ×3 IMPLANT
TOWEL OR 17X24 6PK STRL BLUE (TOWEL DISPOSABLE) ×6 IMPLANT
WATER STERILE IRR 500ML POUR (IV SOLUTION) ×3 IMPLANT

## 2018-07-14 NOTE — Transfer of Care (Signed)
Immediate Anesthesia Transfer of Care Note  Patient: Erin Kent  Procedure(s) Performed: Procedure(s) (LRB): TANDEM RING INSERTION (N/A)  Patient Location: PACU  Anesthesia Type: General  Level of Consciousness: awake, oriented, sedated and patient cooperative  Airway & Oxygen Therapy: Patient Spontanous Breathing and Patient connected to face mask oxygen  Post-op Assessment: Report given to PACU RN and Post -op Vital signs reviewed and stable  Post vital signs: Reviewed and stable  Complications: No apparent anesthesia complications Last Vitals:  Vitals Value Taken Time  BP 148/70 07/14/2018  8:17 AM  Temp    Pulse 61 07/14/2018  8:19 AM  Resp 9 07/14/2018  8:19 AM  SpO2 100 % 07/14/2018  8:19 AM  Vitals shown include unvalidated device data.  Last Pain:  Vitals:   07/14/18 0617  TempSrc:   PainSc: 0-No pain      Patients Stated Pain Goal: 3 (07/14/18 0617)

## 2018-07-14 NOTE — Interval H&P Note (Signed)
History and Physical Interval Note:  07/14/2018 7:29 AM  Erin Kent  has presented today for surgery, with the diagnosis of cervical cancer  The various methods of treatment have been discussed with the patient and family. After consideration of risks, benefits and other options for treatment, the patient has consented to  Procedure(s): TANDEM RING INSERTION (N/A) as a surgical intervention .  The patient's history has been reviewed, patient examined, no change in status, stable for surgery.  I have reviewed the patient's chart and labs.  Questions were answered to the patient's satisfaction.     Gery Pray

## 2018-07-14 NOTE — Anesthesia Procedure Notes (Signed)
Procedure Name: LMA Insertion Date/Time: 07/14/2018 7:40 AM Performed by: Suan Halter, CRNA Pre-anesthesia Checklist: Patient identified, Emergency Drugs available, Suction available and Patient being monitored Patient Re-evaluated:Patient Re-evaluated prior to induction Oxygen Delivery Method: Circle system utilized Preoxygenation: Pre-oxygenation with 100% oxygen Induction Type: IV induction Ventilation: Mask ventilation without difficulty LMA: LMA inserted LMA Size: 3.0 Number of attempts: 1 Airway Equipment and Method: Bite block Placement Confirmation: positive ETCO2 Tube secured with: Tape Dental Injury: Teeth and Oropharynx as per pre-operative assessment

## 2018-07-14 NOTE — Anesthesia Postprocedure Evaluation (Signed)
Anesthesia Post Note  Patient: Erin Kent  Procedure(s) Performed: TANDEM RING INSERTION (N/A )     Patient location during evaluation: PACU Anesthesia Type: General Level of consciousness: oriented, patient cooperative and sedated Pain management: pain level controlled Vital Signs Assessment: post-procedure vital signs reviewed and stable Respiratory status: spontaneous breathing, nonlabored ventilation, respiratory function stable and patient connected to nasal cannula oxygen Cardiovascular status: blood pressure returned to baseline and stable Postop Assessment: no apparent nausea or vomiting Anesthetic complications: no    Last Vitals:  Vitals:   07/14/18 0900 07/14/18 0915  BP: 127/69 116/71  Pulse: (!) 58 (!) 59  Resp: 12 19  Temp:  (!) 36.3 C  SpO2: 100% 100%    Last Pain:  Vitals:   07/14/18 0817  TempSrc:   PainSc: 0-No pain                 Erin Kent,Erin Kent

## 2018-07-14 NOTE — Op Note (Signed)
07/14/2018  9:30 AM  PATIENT:  Erin Kent  67 y.o. female  PRE-OPERATIVE DIAGNOSIS:  cervical cancer  POST-OPERATIVE DIAGNOSIS:  cervical cancer  PROCEDURE:  Procedure(s): TANDEM RING INSERTION (N/A)  SURGEON:  Surgeon(s) and Role:    * Gery Pray, MD - Primary  PHYSICIAN ASSISTANT:   ASSISTANTS: none   ANESTHESIA:   general  EBL:  0 mL   BLOOD ADMINISTERED:none  DRAINS: Urinary Catheter (Foley)   LOCAL MEDICATIONS USED:  NONE  SPECIMEN:  No Specimen  DISPOSITION OF SPECIMEN:  N/A  COUNTS:  YES  TOURNIQUET:  * No tourniquets in log *  DICTATION: the patient was taken tooutpatient room#1. A general anesthetic was applied. Patient was prepped and draped in the usual sterile fashion and placed in the dorsolithotomy position. A time out for the procedure,anticipated length of the procedure and preoperative medications was performed prior to initiation of the procedure. Exam under anesthesia showed excellent response to the patient's prior external beam and radiosensitizing chemotherapy. The cervix was essentially normal in size. Very little bleeding with the procedure today. Review of the vaginal vault revealed no obvious residual tumor along the anterior distal vaginal cavity. Patient did have a Foley catheter placed and this was used for ultrasound imaging. The Foley catheter was back filled with approximately 200 mL of sterile water. Good imaging was obtained. The uterus was noted to be quite small and mildly anteverted. The uterus was estimated to be approximately 5 cm in length. Patient proceeded to undergo dilation of the cervical os. Given the narrowed vaginal cavity patient could not have placement of the standard ring for high-dose rate radiation therapy. Was decided that point that the patient should be treated with a tandem cylinder apparatus. Patient had a 15 60 mm tandem placed within the cavity showing accurate placement within the endometrial canal. Patient  then had a 2.0 cm diametercylinder attached to the tandem.  Patient tolerated the procedure well and subsequentlytransported to the recovery room in stable condition. Later today the patient will undergo planning and her third high-dose-rate treatment directed at the cervix.  PLAN OF CARE: transferred to radiation oncology for planning and treatment  PATIENT DISPOSITION:  PACU - hemodynamically stable.   Delay start of Pharmacological VTE agent (>24hrs) due to surgical blood loss or risk of bleeding: not applicable

## 2018-07-14 NOTE — Progress Notes (Signed)
  Radiation Oncology         [phone] ________________________________  Name: Erin Kent MRN: [id number]  Date: 07/14/2018  DOB: [birth year]  CC: Patient, [name]  Isabel Caprice, MD  HDR BRACHYTHERAPY NOTE  DIAGNOSIS: Stage IIIC1,invasive squamous cell carcinoma to cervix(FIGO 2018 Staging)  NARRATIVE: The patient was brought to the [place] suite. Identity was confirmed. All relevant records and images related to the planned course of therapy were reviewed. The patient freely provided informed written consent to proceed with treatment after reviewing the details related to the planned course of therapy. The consent form was witnessed and verified by the simulation staff. Then, the patient was set-up in a stable reproducible supine position for radiation therapy. The tandem cylinder system was accessed and fiducial markers were placed within the tandem and cylinder.   Simple treatment device note: On the operating room the patient had construction of her custom tandem cylinder system. She will be treated with a 15 tandem/cylinder system. The patient had placement of a 2.0 cm cylinder.   Verification simulation note: An AP and lateral film was obtained through the pelvis area. This was compared to the patient's planning films documenting accurate position of the tandem/cylinder system for treatment.  High-dose-rate brachytherapy treatment note:  The remote afterloading device was accessed through catheter system and attached to the tandem cylinder system. Patient then proceeded to undergo her third high-dose-rate treatment directed at the cervix. The patient was prescribed a dose of 5.5 gray to be delivered to the [place]. Patient was treated with 1 channel using 19 dwell positions. Treatment time was 220.6 seconds. The patient tolerated the procedure well. After completion of her therapy, a radiation survey was performed documenting return of the iridium source into the GammaMed safe.  The patient was then transferred to the nursing suite. She then had removal of  the tandem and cylinder system. The patient tolerated the removal well.  PLAN: Patient will return next week for her fourth high-dose-rate treatment. ________________________________   Blair Promise, PhD, MD  This document serves as a record of services personally performed by Gery Pray, MD. It was created on his behalf by Rae Lips, a trained medical scribe. The creation of this record is based on the scribe's personal observations and the provider's statements to them. This document has been checked and approved by the attending provider.

## 2018-07-14 NOTE — Progress Notes (Signed)
Pt received from [facility] outpatient surgery center PACU. Pt transported to CT simulation in no apparent distress.   Pt received into room one from [place]. IVF infusing via PIV without difficulty. Foley catheter patent, intact, draining pale yellow urine. Pt denies c/o pain at this time. VSS. Breakfast tray ordered. Loma Sousa, RN BSN

## 2018-07-14 NOTE — Progress Notes (Signed)
  Radiation Oncology         [phone]) [phone] ________________________________  Name: Erin Kent MRN: [id number]  Date: 07/14/2018  DOB: Feb 07, 1951  SIMULATION AND TREATMENT PLANNING NOTE HDR BRACHYTHERAPY  DIAGNOSIS:  67 y.o. female with Stage IIIC1,invasive squamous cell carcinoma to cervix(FIGO 2018 Staging)  NARRATIVE:  The patient was brought to the [place].  Identity was confirmed.  All relevant records and images related to the planned course of therapy were reviewed.  The patient freely provided informed written consent to proceed with treatment after reviewing the details related to the planned course of therapy. The consent form was witnessed and verified by the simulation staff.  Then, the patient was set-up in a stable reproducible  supine position for radiation therapy.  CT images were obtained.  Surface markings were placed.  The CT images were loaded into the planning software.  Then the target and avoidance structures were contoured.  Treatment planning then occurred.  The radiation prescription was entered and confirmed.   I have requested : Brachytherapy Isodose Plan and Dosimetry Calculations to plan the radiation distribution.    PLAN:  The patient will receive 5.5 Gy in 1 fraction. The patient will be treated with the tandem cylinder system. Iridium 192 will be the high-dose rate source.    ________________________________  Blair Promise, PhD, MD  This document serves as a record of services personally performed by Gery Pray, MD. It was created on his behalf by Rae Lips, a trained medical scribe. The creation of this record is based on the scribe's personal observations and the provider's statements to them. This document has been checked and approved by the attending provider.

## 2018-07-15 ENCOUNTER — Encounter (HOSPITAL_BASED_OUTPATIENT_CLINIC_OR_DEPARTMENT_OTHER): Payer: Self-pay | Admitting: Radiation Oncology

## 2018-07-15 ENCOUNTER — Other Ambulatory Visit: Payer: Self-pay

## 2018-07-15 ENCOUNTER — Inpatient Hospital Stay: Payer: Medicaid Other | Attending: Obstetrics

## 2018-07-15 DIAGNOSIS — C538 Malignant neoplasm of overlapping sites of cervix uteri: Secondary | ICD-10-CM | POA: Diagnosis present

## 2018-07-15 DIAGNOSIS — R64 Cachexia: Secondary | ICD-10-CM | POA: Insufficient documentation

## 2018-07-15 DIAGNOSIS — T451X5A Adverse effect of antineoplastic and immunosuppressive drugs, initial encounter: Secondary | ICD-10-CM | POA: Insufficient documentation

## 2018-07-15 DIAGNOSIS — Z79899 Other long term (current) drug therapy: Secondary | ICD-10-CM | POA: Insufficient documentation

## 2018-07-15 DIAGNOSIS — C539 Malignant neoplasm of cervix uteri, unspecified: Secondary | ICD-10-CM

## 2018-07-15 DIAGNOSIS — E876 Hypokalemia: Secondary | ICD-10-CM | POA: Diagnosis not present

## 2018-07-15 DIAGNOSIS — R630 Anorexia: Secondary | ICD-10-CM | POA: Diagnosis not present

## 2018-07-15 DIAGNOSIS — D6481 Anemia due to antineoplastic chemotherapy: Secondary | ICD-10-CM | POA: Insufficient documentation

## 2018-07-15 DIAGNOSIS — F341 Dysthymic disorder: Secondary | ICD-10-CM | POA: Diagnosis not present

## 2018-07-15 DIAGNOSIS — E860 Dehydration: Secondary | ICD-10-CM | POA: Insufficient documentation

## 2018-07-15 MED ORDER — SODIUM CHLORIDE 0.9% FLUSH
10.0000 mL | Freq: Once | INTRAVENOUS | Status: AC
  Administered 2018-07-15: 10 mL
  Filled 2018-07-15: qty 10

## 2018-07-15 MED ORDER — HEPARIN SOD (PORK) LOCK FLUSH 100 UNIT/ML IV SOLN
500.0000 [IU] | Freq: Once | INTRAVENOUS | Status: AC
  Administered 2018-07-15: 500 [IU]
  Filled 2018-07-15: qty 5

## 2018-07-15 MED ORDER — SODIUM CHLORIDE 0.9 % IV SOLN
Freq: Once | INTRAVENOUS | Status: AC
  Administered 2018-07-15: 13:00:00 via INTRAVENOUS
  Filled 2018-07-15: qty 250

## 2018-07-15 NOTE — Progress Notes (Signed)
Spoke with Erin Kent friendcaregiver Npo after mdinight arrive 530 am [facility] No meds to take  Driver friend Erin Kent surgery orders in epic

## 2018-07-15 NOTE — Patient Instructions (Signed)
Dehydration, Adult Dehydration is when there is not enough fluid or water in your body. This happens when you lose more fluids than you take in. Dehydration can range from mild to very bad. It should be treated right away to keep it from getting very bad. Symptoms of mild dehydration may include:  Thirst.  Dry lips.  Slightly dry mouth.  Dry, warm skin.  Dizziness. Symptoms of moderate dehydration may include:  Very dry mouth.  Muscle cramps.  Dark pee (urine). Pee may be the color of tea.  Your body making less pee.  Your eyes making fewer tears.  Heartbeat that is uneven or faster than normal (palpitations).  Headache.  Light-headedness, especially when you stand up from sitting.  Fainting (syncope). Symptoms of very bad dehydration may include:  Changes in skin, such as: ? Cold and clammy skin. ? Blotchy (mottled) or pale skin. ? Skin that does not quickly return to normal after being lightly pinched and let go (poor skin turgor).  Changes in body fluids, such as: ? Feeling very thirsty. ? Your eyes making fewer tears. ? Not sweating when body temperature is high, such as in hot weather. ? Your body making very little pee.  Changes in vital signs, such as: ? Weak pulse. ? Pulse that is more than 100 beats a minute when you are sitting still. ? Fast breathing. ? Low blood pressure.  Other changes, such as: ? Sunken eyes. ? Cold hands and feet. ? Confusion. ? Lack of energy (lethargy). ? Trouble waking up from sleep. ? Short-term weight loss. ? Unconsciousness. Follow these instructions at home:  If told by your doctor, drink an ORS: ? Make an ORS by using instructions on the package. ? Start by drinking small amounts, about  cup (120 mL) every 5-10 minutes. ? Slowly drink more until you have had the amount that your doctor said to have.  Drink enough clear fluid to keep your pee clear or pale yellow. If you were told to drink an ORS, finish the ORS  first, then start slowly drinking clear fluids. Drink fluids such as: ? Water. Do not drink only water by itself. Doing that can make the salt (sodium) level in your body get too low (hyponatremia). ? Ice chips. ? Fruit juice that you have added water to (diluted). ? Low-calorie sports drinks.  Avoid: ? Alcohol. ? Drinks that have a lot of sugar. These include high-calorie sports drinks, fruit juice that does not have water added, and soda. ? Caffeine. ? Foods that are greasy or have a lot of fat or sugar.  Take over-the-counter and prescription medicines only as told by your doctor.  Do not take salt tablets. Doing that can make the salt level in your body get too high (hypernatremia).  Eat foods that have minerals (electrolytes). Examples include bananas, oranges, potatoes, tomatoes, and spinach.  Keep all follow-up visits as told by your doctor. This is important. Contact a doctor if:  You have belly (abdominal) pain that: ? Gets worse. ? Stays in one area (localizes).  You have a rash.  You have a stiff neck.  You get angry or annoyed more easily than normal (irritability).  You are more sleepy than normal.  You have a harder time waking up than normal.  You feel: ? Weak. ? Dizzy. ? Very thirsty.  You have peed (urinated) only a small amount of very dark pee during 6-8 hours. Get help right away if:  You have symptoms of   very bad dehydration.  You cannot drink fluids without throwing up (vomiting).  Your symptoms get worse with treatment.  You have a fever.  You have a very bad headache.  You are throwing up or having watery poop (diarrhea) and it: ? Gets worse. ? Does not go away.  You have blood or something green (bile) in your throw-up.  You have blood in your poop (stool). This may cause poop to look black and tarry.  You have not peed in 6-8 hours.  You pass out (faint).  Your heart rate when you are sitting still is more than 100 beats a  minute.  You have trouble breathing. This information is not intended to replace advice given to you by your health care provider. Make sure you discuss any questions you have with your health care provider. Document Released: 06/27/2009 Document Revised: 03/20/2016 Document Reviewed: 10/25/2015 Elsevier Interactive Patient Education  2018 Elsevier Inc.  

## 2018-07-18 ENCOUNTER — Ambulatory Visit (HOSPITAL_COMMUNITY)
Admission: RE | Admit: 2018-07-18 | Discharge: 2018-07-18 | Disposition: A | Discharge status: Home / Self Care | Payer: Medicaid Other | Source: Ambulatory Visit | Attending: Hematology and Oncology | Admitting: Hematology and Oncology

## 2018-07-18 ENCOUNTER — Ambulatory Visit
Admission: RE | Admit: 2018-07-18 | Discharge: 2018-07-18 | Disposition: A | Discharge status: Home / Self Care | Payer: Medicaid Other | Source: Ambulatory Visit | Attending: Radiation Oncology | Admitting: Radiation Oncology

## 2018-07-18 ENCOUNTER — Encounter (HOSPITAL_BASED_OUTPATIENT_CLINIC_OR_DEPARTMENT_OTHER)
Admission: RE | Disposition: A | Discharge status: Home / Self Care | Payer: Self-pay | Source: Ambulatory Visit | Attending: Radiation Oncology

## 2018-07-18 ENCOUNTER — Ambulatory Visit (HOSPITAL_BASED_OUTPATIENT_CLINIC_OR_DEPARTMENT_OTHER): Payer: Medicaid Other | Admitting: Anesthesiology

## 2018-07-18 ENCOUNTER — Ambulatory Visit (HOSPITAL_BASED_OUTPATIENT_CLINIC_OR_DEPARTMENT_OTHER)
Admission: RE | Admit: 2018-07-18 | Discharge: 2018-07-18 | Disposition: A | Discharge status: Home / Self Care | Payer: Medicaid Other | Source: Ambulatory Visit | Attending: Radiation Oncology | Admitting: Radiation Oncology

## 2018-07-18 ENCOUNTER — Other Ambulatory Visit: Payer: Self-pay | Admitting: *Deleted

## 2018-07-18 ENCOUNTER — Encounter (HOSPITAL_BASED_OUTPATIENT_CLINIC_OR_DEPARTMENT_OTHER): Payer: Self-pay | Admitting: *Deleted

## 2018-07-18 VITALS — BP 145/71 | HR 64 | Temp 97.6°F | Resp 18

## 2018-07-18 DIAGNOSIS — N898 Other specified noninflammatory disorders of vagina: Secondary | ICD-10-CM | POA: Diagnosis not present

## 2018-07-18 DIAGNOSIS — Z79899 Other long term (current) drug therapy: Secondary | ICD-10-CM | POA: Insufficient documentation

## 2018-07-18 DIAGNOSIS — F329 Major depressive disorder, single episode, unspecified: Secondary | ICD-10-CM | POA: Insufficient documentation

## 2018-07-18 DIAGNOSIS — Z09 Encounter for follow-up examination after completed treatment for conditions other than malignant neoplasm: Secondary | ICD-10-CM

## 2018-07-18 DIAGNOSIS — C539 Malignant neoplasm of cervix uteri, unspecified: Secondary | ICD-10-CM

## 2018-07-18 DIAGNOSIS — M8580 Other specified disorders of bone density and structure, unspecified site: Secondary | ICD-10-CM | POA: Diagnosis not present

## 2018-07-18 DIAGNOSIS — C801 Malignant (primary) neoplasm, unspecified: Secondary | ICD-10-CM

## 2018-07-18 DIAGNOSIS — Z510 Encounter for antineoplastic radiation therapy: Secondary | ICD-10-CM | POA: Insufficient documentation

## 2018-07-18 DIAGNOSIS — C538 Malignant neoplasm of overlapping sites of cervix uteri: Secondary | ICD-10-CM | POA: Diagnosis not present

## 2018-07-18 HISTORY — PX: TANDEM RING INSERTION: SHX6199

## 2018-07-18 LAB — CBC WITH DIFFERENTIAL (CANCER CENTER ONLY)
ABS IMMATURE GRANULOCYTES: 0.04 10*3/uL (ref 0.00–0.07)
BASOS ABS: 0 10*3/uL (ref 0.0–0.1)
BASOS PCT: 0 %
Eosinophils Absolute: 0 10*3/uL (ref 0.0–0.5)
Eosinophils Relative: 0 %
HCT: 31.3 % — ABNORMAL LOW (ref 36.0–46.0)
Hemoglobin: 9.9 g/dL — ABNORMAL LOW (ref 12.0–15.0)
Immature Granulocytes: 1 %
LYMPHS PCT: 2 %
Lymphs Abs: 0.1 10*3/uL — ABNORMAL LOW (ref 0.7–4.0)
MCH: 28.4 pg (ref 26.0–34.0)
MCHC: 31.6 g/dL (ref 30.0–36.0)
MCV: 89.9 fL (ref 80.0–100.0)
Monocytes Absolute: 0.2 10*3/uL (ref 0.1–1.0)
Monocytes Relative: 4 %
NRBC: 0 % (ref 0.0–0.2)
Neutro Abs: 3.5 10*3/uL (ref 1.7–7.7)
Neutrophils Relative %: 93 %
PLATELETS: 58 10*3/uL — AB (ref 150–400)
RBC: 3.48 MIL/uL — AB (ref 3.87–5.11)
RDW: 24.7 % — ABNORMAL HIGH (ref 11.5–15.5)
WBC: 3.8 10*3/uL — AB (ref 4.0–10.5)

## 2018-07-18 LAB — CMP (CANCER CENTER ONLY)
ALT: 12 U/L (ref 0–44)
AST: 14 U/L — ABNORMAL LOW (ref 15–41)
Albumin: 3.2 g/dL — ABNORMAL LOW (ref 3.5–5.0)
Alkaline Phosphatase: 56 U/L (ref 38–126)
Anion gap: 8 (ref 5–15)
BUN: 15 mg/dL (ref 8–23)
CHLORIDE: 102 mmol/L (ref 98–111)
CO2: 31 mmol/L (ref 22–32)
Calcium: 8.5 mg/dL — ABNORMAL LOW (ref 8.9–10.3)
Creatinine: 0.73 mg/dL (ref 0.44–1.00)
GFR, Estimated: >60 mL/min (ref >60)
Glucose, Bld: 127 mg/dL — ABNORMAL HIGH (ref 70–99)
POTASSIUM: 4 mmol/L (ref 3.5–5.1)
Sodium: 141 mmol/L (ref 135–145)
Total Bilirubin: 0.3 mg/dL (ref 0.3–1.2)
Total Protein: 5.9 g/dL — ABNORMAL LOW (ref 6.5–8.1)

## 2018-07-18 LAB — MAGNESIUM: Magnesium: 1.6 mg/dL — ABNORMAL LOW (ref 1.7–2.4)

## 2018-07-18 SURGERY — INSERTION, UTERINE TANDEM AND RING OR CYLINDER, FOR BRACHYTHERAPY
Anesthesia: General

## 2018-07-18 MED ORDER — ONDANSETRON HCL 4 MG/2ML IJ SOLN
INTRAMUSCULAR | Status: AC
  Filled 2018-07-18: qty 2

## 2018-07-18 MED ORDER — DEXAMETHASONE SODIUM PHOSPHATE 10 MG/ML IJ SOLN
INTRAMUSCULAR | Status: AC
  Filled 2018-07-18: qty 1

## 2018-07-18 MED ORDER — FENTANYL CITRATE (PF) 100 MCG/2ML IJ SOLN
25.0000 ug | INTRAMUSCULAR | Status: DC | PRN
  Filled 2018-07-18: qty 1

## 2018-07-18 MED ORDER — KETOROLAC TROMETHAMINE 15 MG/ML IJ SOLN
INTRAMUSCULAR | Status: DC | PRN
  Administered 2018-07-18: 15 mg via INTRAVENOUS

## 2018-07-18 MED ORDER — LACTATED RINGERS IV SOLN
INTRAVENOUS | Status: DC
  Administered 2018-07-18: 10:00:00 via INTRAVENOUS
  Filled 2018-07-18 (×2): qty 250

## 2018-07-18 MED ORDER — LACTATED RINGERS IV SOLN
INTRAVENOUS | Status: DC
  Administered 2018-07-18: 09:00:00 via INTRAVENOUS
  Filled 2018-07-18 (×2): qty 1000

## 2018-07-18 MED ORDER — FENTANYL CITRATE (PF) 100 MCG/2ML IJ SOLN
INTRAMUSCULAR | Status: DC | PRN
  Administered 2018-07-18: 25 ug via INTRAVENOUS

## 2018-07-18 MED ORDER — PROPOFOL 10 MG/ML IV BOLUS
INTRAVENOUS | Status: DC | PRN
  Administered 2018-07-18: 40 mg via INTRAVENOUS
  Administered 2018-07-18: 70 mg via INTRAVENOUS

## 2018-07-18 MED ORDER — FENTANYL CITRATE (PF) 100 MCG/2ML IJ SOLN
INTRAMUSCULAR | Status: AC
  Filled 2018-07-18: qty 2

## 2018-07-18 MED ORDER — LIDOCAINE 2% (20 MG/ML) 5 ML SYRINGE
INTRAMUSCULAR | Status: AC
  Filled 2018-07-18: qty 5

## 2018-07-18 MED ORDER — LACTATED RINGERS IV SOLN
INTRAVENOUS | Status: DC
  Administered 2018-07-18: 07:00:00 via INTRAVENOUS
  Filled 2018-07-18: qty 1000

## 2018-07-18 MED ORDER — LACTATED RINGERS IV SOLN
INTRAVENOUS | Status: DC | PRN
  Administered 2018-07-18: 07:00:00 via INTRAVENOUS

## 2018-07-18 MED ORDER — ONDANSETRON HCL 4 MG/2ML IJ SOLN
INTRAMUSCULAR | Status: DC | PRN
  Administered 2018-07-18: 4 mg via INTRAVENOUS

## 2018-07-18 MED ORDER — DEXAMETHASONE SODIUM PHOSPHATE 10 MG/ML IJ SOLN
INTRAMUSCULAR | Status: DC | PRN
  Administered 2018-07-18: 5 mg via INTRAVENOUS

## 2018-07-18 MED ORDER — SODIUM CHLORIDE 0.9 % IR SOLN
Status: DC | PRN
  Administered 2018-07-18: 1000 mL via INTRAVESICAL

## 2018-07-18 MED ORDER — LIDOCAINE 2% (20 MG/ML) 5 ML SYRINGE
INTRAMUSCULAR | Status: DC | PRN
  Administered 2018-07-18: 25 mg via INTRAVENOUS

## 2018-07-18 MED ORDER — PROPOFOL 10 MG/ML IV BOLUS
INTRAVENOUS | Status: AC
  Filled 2018-07-18: qty 20

## 2018-07-18 MED ORDER — MIDAZOLAM HCL 2 MG/2ML IJ SOLN
INTRAMUSCULAR | Status: AC
  Filled 2018-07-18: qty 2

## 2018-07-18 MED ORDER — ONDANSETRON HCL 4 MG/2ML IJ SOLN
4.0000 mg | Freq: Once | INTRAMUSCULAR | Status: DC | PRN
  Filled 2018-07-18: qty 2

## 2018-07-18 MED ORDER — KETOROLAC TROMETHAMINE 30 MG/ML IJ SOLN
INTRAMUSCULAR | Status: AC
  Filled 2018-07-18: qty 1

## 2018-07-18 MED ORDER — ARTIFICIAL TEARS OPHTHALMIC OINT
TOPICAL_OINTMENT | OPHTHALMIC | Status: AC
  Filled 2018-07-18: qty 3.5

## 2018-07-18 SURGICAL SUPPLY — 22 items
BAG URINE DRAINAGE (UROLOGICAL SUPPLIES) ×3 IMPLANT
BNDG CONFORM 2 STRL LF (GAUZE/BANDAGES/DRESSINGS) IMPLANT
CATH FOLEY 2WAY SLVR  5CC 16FR (CATHETERS) ×2
CATH FOLEY 2WAY SLVR 5CC 16FR (CATHETERS) ×1 IMPLANT
COVER WAND RF STERILE (DRAPES) ×6 IMPLANT
DILATOR CANAL MILEX (MISCELLANEOUS) IMPLANT
DRSG PAD ABDOMINAL 8X10 ST (GAUZE/BANDAGES/DRESSINGS) ×2 IMPLANT
GAUZE 4X4 16PLY RFD (DISPOSABLE) ×3 IMPLANT
GLOVE BIO SURGEON STRL SZ7.5 (GLOVE) ×6 IMPLANT
GOWN STRL REUS W/TWL LRG LVL3 (GOWN DISPOSABLE) ×3 IMPLANT
HOLDER FOLEY CATH W/STRAP (MISCELLANEOUS) ×3 IMPLANT
HOVERMATT SINGLE USE (MISCELLANEOUS) ×3 IMPLANT
IV NS 1000ML (IV SOLUTION) ×3
IV NS 1000ML BAXH (IV SOLUTION) ×1 IMPLANT
KIT TURNOVER CYSTO (KITS) ×3 IMPLANT
PACK VAGINAL MINOR WOMEN LF (CUSTOM PROCEDURE TRAY) ×3 IMPLANT
PACKING VAGINAL (PACKING) IMPLANT
PAD ABD 8X10 STRL (GAUZE/BANDAGES/DRESSINGS) ×3 IMPLANT
PAD OB MATERNITY 4.3X12.25 (PERSONAL CARE ITEMS) IMPLANT
SET IRRIG Y TYPE TUR BLADDER L (SET/KITS/TRAYS/PACK) ×3 IMPLANT
TOWEL OR 17X24 6PK STRL BLUE (TOWEL DISPOSABLE) ×6 IMPLANT
WATER STERILE IRR 500ML POUR (IV SOLUTION) ×3 IMPLANT

## 2018-07-18 NOTE — Op Note (Signed)
07/18/2018  5:58 PM  PATIENT:  Erin Kent  67 y.o. female  PRE-OPERATIVE DIAGNOSIS:  cervical cancer  POST-OPERATIVE DIAGNOSIS:  cervical cancer  PROCEDURE:  Procedure(s): TANDEM CYLINDER INSERTION (N/A)  SURGEON:  Surgeon(s) and Role:    * Gery Pray, MD - Primary  PHYSICIAN ASSISTANT:   ASSISTANTS: none   ANESTHESIA:   general  EBL:  0 mL   BLOOD ADMINISTERED:none  DRAINS: Urinary Catheter (Foley)   LOCAL MEDICATIONS USED:  NONE  SPECIMEN:  No Specimen  DISPOSITION OF SPECIMEN:  N/A  COUNTS:  YES  TOURNIQUET:  * No tourniquets in log *  DICTATION: the patient was taken tooutpatient room#1. A general anesthetic was applied. Patient was prepped and draped in the usual sterile fashion and placed in the dorsolithotomy position. A time outforthe procedure,anticipated length of the procedure and preoperative medications was performed prior to initiation of the procedure. Exam under anesthesia showed excellent response to the patient's prior external beam and radiosensitizing chemotherapy. The cervix was essentially normal in size.Very little bleeding with the procedure today. Review of the vaginal vault revealed no obvious residual tumor along the anterior distal vaginal cavity. Patient did have a Foley catheter placed and this was used for ultrasound imaging. The Foley catheter was back filled with approximately 200 mL of sterile water.Goodimaging was obtained. The uterus was noted to be quite small and mildly anteverted. The uterus was estimated to be approximately 4.5 cm in length. Patient proceeded to undergo dilation of the cervical os. Given the narrowed vaginal cavity patient could not have placement of the standard ring for high-dose rate radiation therapy. Was decided that point that the patient should be treated with a tandem cylinder apparatus. Patient had a 15 40 mm tandem placed within the cavity showing accurate placement within the endometrial canal.  Patient then had a 2.0cm diametercylinder attached to the tandem. Patient tolerated the procedure well and subsequentlytransported to the recovery room in stable condition. Later today the patient will undergo planning and herfourthhigh-dose-rate treatment directed at the cervix.  PLAN OF CARE: transfer to rad/onc for planning and treatment  PATIENT DISPOSITION:  PACU - hemodynamically stable.   Delay start of Pharmacological VTE agent (>24hrs) due to surgical blood loss or risk of bleeding: not applicable

## 2018-07-18 NOTE — Anesthesia Postprocedure Evaluation (Signed)
Anesthesia Post Note  Patient: Erin Kent  Procedure(s) Performed: TANDEM CYLINDER INSERTION (N/A )     Patient location during evaluation: PACU Anesthesia Type: General Level of consciousness: awake and alert, awake and oriented Pain management: pain level controlled Vital Signs Assessment: post-procedure vital signs reviewed and stable Respiratory status: spontaneous breathing, nonlabored ventilation, respiratory function stable and patient connected to nasal cannula oxygen Cardiovascular status: blood pressure returned to baseline and stable Postop Assessment: no apparent nausea or vomiting Anesthetic complications: no    Last Vitals:  Vitals:   07/18/18 0900 07/18/18 0905  BP: (!) 142/65   Pulse: 62 66  Resp: 11 13  Temp:    SpO2: 100% 98%    Last Pain:  Vitals:   07/18/18 0905  TempSrc:   PainSc: 0-No pain                 Catalina Gravel

## 2018-07-18 NOTE — Progress Notes (Signed)
Foley catheter removed without difficulty. Cervical applicator removed by Dr. Sondra Come. PIV removed without difficulty, catheter tip intact. Pt tolerated well. Pt with bilateral ankle edema, 2+. Pt in no apparent distress. Pt was discharged to caregiver, Erin Kent care. Pt proceeded to lab for ordered lab work immediately after discharge. Loma Sousa, RN BSN

## 2018-07-18 NOTE — Transfer of Care (Signed)
Immediate Anesthesia Transfer of Care Note  Patient: Erin Kent  Procedure(s) Performed: TANDEM CYLINDER INSERTION (N/A )  Patient Location: PACU  Anesthesia Type:General  Level of Consciousness: awake, alert , oriented and patient cooperative  Airway & Oxygen Therapy: Patient Spontanous Breathing and Patient connected to nasal cannula oxygen  Post-op Assessment: Report given to RN and Post -op Vital signs reviewed and stable  Post vital signs: Reviewed and stable  Last Vitals:  Vitals Value Taken Time  BP 150/70 07/18/2018  8:20 AM  Temp    Pulse 59 07/18/2018  8:24 AM  Resp 12 07/18/2018  8:24 AM  SpO2 100 % 07/18/2018  8:24 AM  Vitals shown include unvalidated device data.  Last Pain:  Vitals:   07/18/18 0647  TempSrc:   PainSc: 0-No pain      Patients Stated Pain Goal: 3 (76/81/15 7262)  Complications: No apparent anesthesia complications

## 2018-07-18 NOTE — Interval H&P Note (Signed)
History and Physical Interval Note:  07/18/2018 7:27 AM  Erin Kent  has presented today for surgery, with the diagnosis of cervical cancer  The various methods of treatment have been discussed with the patient and family. After consideration of risks, benefits and other options for treatment, the patient has consented to  Procedure(s): TANDEM RING INSERTION (N/A) as a surgical intervention .  The patient's history has been reviewed, patient examined, no change in status, stable for surgery.  I have reviewed the patient's chart and labs.  Questions were answered to the patient's satisfaction.     Gery Pray

## 2018-07-18 NOTE — Progress Notes (Addendum)
Received pt from [facility] OP PACU. Pt alert and aware of surroundings. In no acute distress. PIV infusion without difficulty. Foley patent, draining clear light yellow urine. Bilateral ankles with 2+ pitting edema. Transported pt to [place]. Loma Sousa, RN BSN    Recived pt from [place]. PIV infusing without difficulty via Alaris pump. Foley patent. Breakfast ordered per pt request. Loma Sousa, RN BSN

## 2018-07-18 NOTE — Progress Notes (Signed)
  Radiation Oncology         (336) [phone] ________________________________  Name: Erin Kent MRN: [id number]  Date: 07/18/2018  DOB: [birth year]  CC: Patient, [name]  Isabel Caprice, MD  HDR BRACHYTHERAPY NOTE  DIAGNOSIS: Stage IIIC1,invasive squamous cell carcinoma to cervix(FIGO 2018 Staging)  NARRATIVE: The patient was brought to the [place] suite. Identity was confirmed. All relevant records and images related to the planned course of therapy were reviewed. The patient freely provided informed written consent to proceed with treatment after reviewing the details related to the planned course of therapy. The consent form was witnessed and verified by the simulation staff. Then, the patient was set-up in a stable reproducible supine position for radiation therapy. The tandem cylinder system was accessed and fiducial markers were placed within the tandem and cylinder.   Simple treatment device note: On the operating room the patient had construction of her custom tandem cylinder system. She will be treated with a 15 tandem/cylinder system. The patient had placement of a 2.0 cm cylinder.   Verification simulation note: An AP and lateral film was obtained through the pelvis area. This was compared to the patient's planning films documenting accurate position of the tandem/cylinder system for treatment.  High-dose-rate brachytherapy treatment note:  The remote afterloading device was accessed through catheter system and attached to the tandem cylinder system. Patient then proceeded to undergo her fourth high-dose-rate treatment directed at the cervix. The patient was prescribed a dose of 5.5 gray to be delivered to the [place]. Patient was treated with 1 channel using 19 dwell positions. Treatment time was 146.8 seconds. The patient tolerated the procedure well. After completion of her therapy, a radiation survey was performed documenting return of the iridium source into the GammaMed safe.  The patient was then transferred to the nursing suite. She then had removal of  the tandem and cylinder system. The patient tolerated the removal well.  PLAN: Patient will return next week for her fifth high-dose-rate treatment. ________________________________   Blair Promise, PhD, MD  This document serves as a record of services personally performed by Gery Pray, MD. It was created on his behalf by Mary-Margaret Loma Messing, a trained medical scribe. The creation of this record is based on the scribe's personal observations and the provider's statements to them. This document has been checked and approved by the attending provider.

## 2018-07-18 NOTE — Anesthesia Procedure Notes (Signed)
Procedure Name: LMA Insertion Date/Time: 07/18/2018 7:37 AM Performed by: Wanita Chamberlain, CRNA Pre-anesthesia Checklist: Patient identified, Emergency Drugs available, Suction available, Patient being monitored and Timeout performed Patient Re-evaluated:Patient Re-evaluated prior to induction Oxygen Delivery Method: Circle system utilized Preoxygenation: Pre-oxygenation with 100% oxygen Induction Type: IV induction Ventilation: Mask ventilation without difficulty LMA: LMA inserted LMA Size: 3.0 Number of attempts: 1 Placement Confirmation: positive ETCO2,  CO2 detector and breath sounds checked- equal and bilateral Tube secured with: Tape Dental Injury: Teeth and Oropharynx as per pre-operative assessment

## 2018-07-18 NOTE — Anesthesia Preprocedure Evaluation (Addendum)
Anesthesia Evaluation  Patient identified by MRN, date of birth, ID band Patient awake    Reviewed: Allergy & Precautions, NPO status , Patient's Chart, lab work & pertinent test results  History of Anesthesia Complications Negative for: history of anesthetic complications  Airway Mallampati: II  TM Distance: >3 FB Neck ROM: Full    Dental  (+) Poor Dentition, Chipped, Loose, Caps, Dental Advisory Given   Pulmonary neg pulmonary ROS,    breath sounds clear to auscultation       Cardiovascular negative cardio ROS   Rhythm:Regular Rate:Normal     Neuro/Psych PSYCHIATRIC DISORDERS Depression Requiring hospitalizationnegative neurological ROS     GI/Hepatic negative GI ROS, Neg liver ROS,   Endo/Other  negative endocrine ROS  Renal/GU    Cervical cancer: chemo, XRT    Musculoskeletal   Abdominal   Peds  Hematology  (+) anemia ,   Anesthesia Other Findings   Reproductive/Obstetrics                            Anesthesia Physical  Anesthesia Plan  ASA: III  Anesthesia Plan: General   Post-op Pain Management:    Induction: Intravenous  PONV Risk Score and Plan: 3 and Ondansetron, Dexamethasone and Treatment may vary due to age or medical condition  Airway Management Planned: LMA  Additional Equipment:   Intra-op Plan:   Post-operative Plan: Extubation in OR  Informed Consent: I have reviewed the patients History and Physical, chart, labs and discussed the procedure including the risks, benefits and alternatives for the proposed anesthesia with the patient or authorized representative who has indicated his/her understanding and acceptance.   Dental advisory given  Plan Discussed with: CRNA and Surgeon  Anesthesia Plan Comments:       Anesthesia Quick Evaluation

## 2018-07-18 NOTE — Progress Notes (Signed)
  Radiation Oncology         [phone]) [phone] ________________________________  Name: Erin Kent MRN: [id number]  Date: 07/18/2018  DOB: [birth year]  SIMULATION AND TREATMENT PLANNING NOTE HDR BRACHYTHERAPY  DIAGNOSIS:  FIGO Stage IIIC1, invasive squamous cell carcinoma to cervix (FIGO 2018 staging)   NARRATIVE:  The patient was brought to the [place].  Identity was confirmed.  All relevant records and images related to the planned course of therapy were reviewed.  The patient freely provided informed written consent to proceed with treatment after reviewing the details related to the planned course of therapy. The consent form was witnessed and verified by the simulation staff.  Then, the patient was set-up in a stable reproducible  supine position for radiation therapy.  CT images were obtained.  Surface markings were placed.  The CT images were loaded into the planning software.  Then the target and avoidance structures were contoured.  Treatment planning then occurred.  The radiation prescription was entered and confirmed.   I have requested : Brachytherapy Isodose Plan and Dosimetry Calculations to plan the radiation distribution.    PLAN:  The patient will receive 5.5 Gy in 1 fraction.     ________________________________  Blair Promise, PhD, MD   This document serves as a record of services personally performed by Gery Pray, MD. It was created on his behalf by Mary-Margaret Loma Messing, a trained medical scribe. The creation of this record is based on the scribe's personal observations and the provider's statements to them. This document has been checked and approved by the attending provider.

## 2018-07-19 ENCOUNTER — Inpatient Hospital Stay: Payer: Medicaid Other

## 2018-07-19 ENCOUNTER — Telehealth: Payer: Self-pay

## 2018-07-19 ENCOUNTER — Inpatient Hospital Stay: Payer: Medicaid Other | Admitting: Nutrition

## 2018-07-19 ENCOUNTER — Encounter (HOSPITAL_BASED_OUTPATIENT_CLINIC_OR_DEPARTMENT_OTHER): Payer: Self-pay | Admitting: Radiation Oncology

## 2018-07-19 NOTE — Telephone Encounter (Signed)
Called and left a message for caregiver [facility] regarding today's missed appt. Ask her to call the office back.

## 2018-07-21 ENCOUNTER — Encounter (HOSPITAL_BASED_OUTPATIENT_CLINIC_OR_DEPARTMENT_OTHER): Payer: Self-pay | Admitting: *Deleted

## 2018-07-21 NOTE — Progress Notes (Signed)
Spoke w/ pt's friend/ caregiver , fannie bennett, via phone for pre-op interview.  Npo after mn.  Arrive at [employer].  Current lab results , dated 07-18-2018, in chart and epic.  You'll need pt friend/caregiver in pre-op with pt.  Pre-op orders pending.

## 2018-07-22 ENCOUNTER — Inpatient Hospital Stay (HOSPITAL_BASED_OUTPATIENT_CLINIC_OR_DEPARTMENT_OTHER): Payer: Medicaid Other | Admitting: Medical

## 2018-07-22 ENCOUNTER — Inpatient Hospital Stay: Payer: Medicaid Other

## 2018-07-22 ENCOUNTER — Ambulatory Visit: Payer: Medicaid Other | Admitting: Nutrition

## 2018-07-22 VITALS — BP 109/59 | HR 65 | Temp 97.9°F | Resp 20

## 2018-07-22 DIAGNOSIS — C9000 Multiple myeloma not having achieved remission: Secondary | ICD-10-CM

## 2018-07-22 DIAGNOSIS — C538 Malignant neoplasm of overlapping sites of cervix uteri: Secondary | ICD-10-CM | POA: Diagnosis not present

## 2018-07-22 DIAGNOSIS — R630 Anorexia: Secondary | ICD-10-CM

## 2018-07-22 DIAGNOSIS — C539 Malignant neoplasm of cervix uteri, unspecified: Secondary | ICD-10-CM

## 2018-07-22 MED ORDER — SODIUM CHLORIDE 0.9 % IV SOLN
Freq: Once | INTRAVENOUS | Status: AC
  Filled 2018-07-22: qty 250

## 2018-07-22 MED ORDER — SODIUM CHLORIDE 0.9% FLUSH
10.0000 mL | Freq: Once | INTRAVENOUS | Status: AC
  Administered 2018-07-22: 10 mL
  Filled 2018-07-22: qty 10

## 2018-07-22 MED ORDER — HEPARIN SOD (PORK) LOCK FLUSH 100 UNIT/ML IV SOLN
500.0000 [IU] | Freq: Once | INTRAVENOUS | Status: AC
  Administered 2018-07-22: 500 [IU]
  Filled 2018-07-22: qty 5

## 2018-07-22 MED ORDER — SODIUM CHLORIDE 0.9 % IV SOLN
INTRAVENOUS | Status: DC
  Administered 2018-07-22: 12:00:00 via INTRAVENOUS
  Filled 2018-07-22 (×2): qty 250

## 2018-07-22 NOTE — Patient Instructions (Signed)
Dehydration, Adult Dehydration is when there is not enough fluid or water in your body. This happens when you lose more fluids than you take in. Dehydration can range from mild to very bad. It should be treated right away to keep it from getting very bad. Symptoms of mild dehydration may include:  Thirst.  Dry lips.  Slightly dry mouth.  Dry, warm skin.  Dizziness. Symptoms of moderate dehydration may include:  Very dry mouth.  Muscle cramps.  Dark pee (urine). Pee may be the color of tea.  Your body making less pee.  Your eyes making fewer tears.  Heartbeat that is uneven or faster than normal (palpitations).  Headache.  Light-headedness, especially when you stand up from sitting.  Fainting (syncope). Symptoms of very bad dehydration may include:  Changes in skin, such as: ? Cold and clammy skin. ? Blotchy (mottled) or pale skin. ? Skin that does not quickly return to normal after being lightly pinched and let go (poor skin turgor).  Changes in body fluids, such as: ? Feeling very thirsty. ? Your eyes making fewer tears. ? Not sweating when body temperature is high, such as in hot weather. ? Your body making very little pee.  Changes in vital signs, such as: ? Weak pulse. ? Pulse that is more than 100 beats a minute when you are sitting still. ? Fast breathing. ? Low blood pressure.  Other changes, such as: ? Sunken eyes. ? Cold hands and feet. ? Confusion. ? Lack of energy (lethargy). ? Trouble waking up from sleep. ? Short-term weight loss. ? Unconsciousness. Follow these instructions at home:  If told by your doctor, drink an ORS: ? Make an ORS by using instructions on the package. ? Start by drinking small amounts, about  cup (120 mL) every 5-10 minutes. ? Slowly drink more until you have had the amount that your doctor said to have.  Drink enough clear fluid to keep your pee clear or pale yellow. If you were told to drink an ORS, finish the ORS  first, then start slowly drinking clear fluids. Drink fluids such as: ? Water. Do not drink only water by itself. Doing that can make the salt (sodium) level in your body get too low (hyponatremia). ? Ice chips. ? Fruit juice that you have added water to (diluted). ? Low-calorie sports drinks.  Avoid: ? Alcohol. ? Drinks that have a lot of sugar. These include high-calorie sports drinks, fruit juice that does not have water added, and soda. ? Caffeine. ? Foods that are greasy or have a lot of fat or sugar.  Take over-the-counter and prescription medicines only as told by your doctor.  Do not take salt tablets. Doing that can make the salt level in your body get too high (hypernatremia).  Eat foods that have minerals (electrolytes). Examples include bananas, oranges, potatoes, tomatoes, and spinach.  Keep all follow-up visits as told by your doctor. This is important. Contact a doctor if:  You have belly (abdominal) pain that: ? Gets worse. ? Stays in one area (localizes).  You have a rash.  You have a stiff neck.  You get angry or annoyed more easily than normal (irritability).  You are more sleepy than normal.  You have a harder time waking up than normal.  You feel: ? Weak. ? Dizzy. ? Very thirsty.  You have peed (urinated) only a small amount of very dark pee during 6-8 hours. Get help right away if:  You have symptoms of   very bad dehydration.  You cannot drink fluids without throwing up (vomiting).  Your symptoms get worse with treatment.  You have a fever.  You have a very bad headache.  You are throwing up or having watery poop (diarrhea) and it: ? Gets worse. ? Does not go away.  You have blood or something green (bile) in your throw-up.  You have blood in your poop (stool). This may cause poop to look black and tarry.  You have not peed in 6-8 hours.  You pass out (faint).  Your heart rate when you are sitting still is more than 100 beats a  minute.  You have trouble breathing. This information is not intended to replace advice given to you by your health care provider. Make sure you discuss any questions you have with your health care provider. Document Released: 06/27/2009 Document Revised: 03/20/2016 Document Reviewed: 10/25/2015 Elsevier Interactive Patient Education  2018 Elsevier Inc.  

## 2018-07-22 NOTE — Progress Notes (Signed)
Brief nutrition follow-up completed with patient and caregiver Fannie. Weight decreased and documented as 80 pounds down from 83 pounds September 26. Noted labs glucose 127, albumin 3.2, and potassium 4.0. Caregiver reports patient has not eaten very much this week and has not been consuming oral liquids. Patient is unable to tell me why she does not want to eat and drink. I explained in detail how it was important for her to consume food and fluids to help her body function well. Patient has consumed 2 bowls of soup with crackers and a hydration Ensure drink during IV fluids today. Severe malnutrition continues. I will continue to follow patient and provide support and information.  Nutrition follow-up scheduled on Tuesday, November 19.  **Disclaimer: This note was dictated with voice recognition software. Similar sounding words can inadvertently be transcribed and this note may contain transcription errors which may not have been corrected upon publication of note.**

## 2018-07-25 ENCOUNTER — Telehealth: Payer: Self-pay

## 2018-07-25 ENCOUNTER — Telehealth: Payer: Self-pay | Admitting: Medical

## 2018-07-25 ENCOUNTER — Ambulatory Visit: Payer: Medicaid Other | Admitting: Radiation Oncology

## 2018-07-25 NOTE — Telephone Encounter (Addendum)
Per notes, caregiver [name] reported pt not drinking, eating, or not sleeping and is requesting home health services.  Per Joylene John NP, per pt insurance, has to have face-to-face appt with physician for insurance purposes to get home health set up and to let Elmo Putt nurse navigator help with setting up with Dr Sondra Come at pt appt with him on this Wednesday.  Notified Elmo Putt. Out going call to [facility] caregiver, we have permission to speak with her.  Made her aware of plan to speak with Dr Sondra Come about home health at office appt on Wednesday and she voiced understanding.  Discussed offering small amounts of food / liquid every few hours.   Reports she was able to eat ice cream few times yesterday. For worsening symptoms, can call 911 or take to ER.  Voiced understanding.  Verbalized she will be bring pt for scheduled Infusion tomorrow. No other needs per her at this time.

## 2018-07-25 NOTE — Telephone Encounter (Signed)
No los per 11/8 °

## 2018-07-26 ENCOUNTER — Encounter: Payer: Self-pay | Admitting: Oncology

## 2018-07-26 ENCOUNTER — Inpatient Hospital Stay: Payer: Medicaid Other

## 2018-07-26 ENCOUNTER — Other Ambulatory Visit: Payer: Self-pay

## 2018-07-26 VITALS — BP 120/63 | HR 75 | Temp 98.1°F | Resp 18

## 2018-07-26 DIAGNOSIS — C539 Malignant neoplasm of cervix uteri, unspecified: Secondary | ICD-10-CM

## 2018-07-26 DIAGNOSIS — C538 Malignant neoplasm of overlapping sites of cervix uteri: Secondary | ICD-10-CM | POA: Diagnosis not present

## 2018-07-26 LAB — CBC WITH DIFFERENTIAL/PLATELET
Abs Immature Granulocytes: 0.06 10*3/uL (ref 0.00–0.07)
BASOS PCT: 0 %
Basophils Absolute: 0 10*3/uL (ref 0.0–0.1)
Eosinophils Absolute: 0 10*3/uL (ref 0.0–0.5)
Eosinophils Relative: 1 %
HCT: 31.7 % — ABNORMAL LOW (ref 36.0–46.0)
Hemoglobin: 10.2 g/dL — ABNORMAL LOW (ref 12.0–15.0)
IMMATURE GRANULOCYTES: 2 %
LYMPHS PCT: 5 %
Lymphs Abs: 0.2 10*3/uL — ABNORMAL LOW (ref 0.7–4.0)
MCH: 29.6 pg (ref 26.0–34.0)
MCHC: 32.2 g/dL (ref 30.0–36.0)
MCV: 91.9 fL (ref 80.0–100.0)
Monocytes Absolute: 0.5 10*3/uL (ref 0.1–1.0)
Monocytes Relative: 12 %
NEUTROS ABS: 3.2 10*3/uL (ref 1.7–7.7)
NEUTROS PCT: 80 %
NRBC: 0 % (ref 0.0–0.2)
PLATELETS: 132 10*3/uL — AB (ref 150–400)
RBC: 3.45 MIL/uL — ABNORMAL LOW (ref 3.87–5.11)
RDW: 24.7 % — AB (ref 11.5–15.5)
WBC: 4.1 10*3/uL (ref 4.0–10.5)

## 2018-07-26 LAB — COMPREHENSIVE METABOLIC PANEL
ALT: 17 U/L (ref 0–44)
ANION GAP: 9 (ref 5–15)
AST: 15 U/L (ref 15–41)
Albumin: 3.4 g/dL — ABNORMAL LOW (ref 3.5–5.0)
Alkaline Phosphatase: 61 U/L (ref 38–126)
BILIRUBIN TOTAL: 0.3 mg/dL (ref 0.3–1.2)
BUN: 14 mg/dL (ref 8–23)
CHLORIDE: 105 mmol/L (ref 98–111)
CO2: 29 mmol/L (ref 22–32)
Calcium: 8.6 mg/dL — ABNORMAL LOW (ref 8.9–10.3)
Creatinine, Ser: 0.8 mg/dL (ref 0.44–1.00)
GFR calc Af Amer: >60 mL/min (ref >60)
Glucose, Bld: 122 mg/dL — ABNORMAL HIGH (ref 70–99)
Potassium: 2.9 mmol/L — CL (ref 3.5–5.1)
Sodium: 143 mmol/L (ref 135–145)
TOTAL PROTEIN: 6.2 g/dL — AB (ref 6.5–8.1)

## 2018-07-26 MED ORDER — SODIUM CHLORIDE 0.9 % IV SOLN
Freq: Once | INTRAVENOUS | Status: AC
  Administered 2018-07-26: 14:00:00 via INTRAVENOUS
  Filled 2018-07-26: qty 250

## 2018-07-26 MED ORDER — DEXAMETHASONE 4 MG PO TABS: 4.0000 mg | ORAL_TABLET | Freq: Every day | ORAL | 0 refills | Status: DC

## 2018-07-26 MED ORDER — POTASSIUM CHLORIDE 10 MEQ/100ML IV SOLN
10.0000 meq | INTRAVENOUS | Status: AC
  Administered 2018-07-26 (×2): 10 meq via INTRAVENOUS
  Filled 2018-07-26: qty 100

## 2018-07-26 MED ORDER — SODIUM CHLORIDE 0.9% FLUSH
10.0000 mL | Freq: Once | INTRAVENOUS | Status: AC | PRN
  Administered 2018-07-26: 10 mL
  Filled 2018-07-26: qty 10

## 2018-07-26 MED ORDER — HEPARIN SOD (PORK) LOCK FLUSH 100 UNIT/ML IV SOLN
500.0000 [IU] | Freq: Once | INTRAVENOUS | Status: AC | PRN
  Administered 2018-07-26: 500 [IU]
  Filled 2018-07-26: qty 5

## 2018-07-26 NOTE — Addendum Note (Signed)
Addended by: Elmo Putt R on: 07/26/2018 04:24 PM   Modules accepted: Orders

## 2018-07-26 NOTE — Progress Notes (Signed)
I was asked to see the Erin Kent in the infusion room today.  This request was made by her caregiver who states that the patient is not eating well.  She reports that she is eating and drinking very little.  The patient was given Ensure supplement powder.  She drank part of a 16 ounce cup of a mixed berry flavor.  Interestingly she also ate 2 bowls of soup with crackers while she was here today.  She was seen by nutrition while she was here today.    Sandi Mealy, MHS, PA-C Physician Assistant

## 2018-07-26 NOTE — Patient Instructions (Addendum)
Dehydration, Adult Dehydration is when there is not enough fluid or water in your body. This happens when you lose more fluids than you take in. Dehydration can range from mild to very bad. It should be treated right away to keep it from getting very bad. Symptoms of mild dehydration may include:  Thirst.  Dry lips.  Slightly dry mouth.  Dry, warm skin.  Dizziness. Symptoms of moderate dehydration may include:  Very dry mouth.  Muscle cramps.  Dark pee (urine). Pee may be the color of tea.  Your body making less pee.  Your eyes making fewer tears.  Heartbeat that is uneven or faster than normal (palpitations).  Headache.  Light-headedness, especially when you stand up from sitting.  Fainting (syncope). Symptoms of very bad dehydration may include:  Changes in skin, such as: ? Cold and clammy skin. ? Blotchy (mottled) or pale skin. ? Skin that does not quickly return to normal after being lightly pinched and let go (poor skin turgor).  Changes in body fluids, such as: ? Feeling very thirsty. ? Your eyes making fewer tears. ? Not sweating when body temperature is high, such as in hot weather. ? Your body making very little pee.  Changes in vital signs, such as: ? Weak pulse. ? Pulse that is more than 100 beats a minute when you are sitting still. ? Fast breathing. ? Low blood pressure.  Other changes, such as: ? Sunken eyes. ? Cold hands and feet. ? Confusion. ? Lack of energy (lethargy). ? Trouble waking up from sleep. ? Short-term weight loss. ? Unconsciousness. Follow these instructions at home:  If told by your doctor, drink an ORS: ? Make an ORS by using instructions on the package. ? Start by drinking small amounts, about  cup (120 mL) every 5-10 minutes. ? Slowly drink more until you have had the amount that your doctor said to have.  Drink enough clear fluid to keep your pee clear or pale yellow. If you were told to drink an ORS, finish the ORS  first, then start slowly drinking clear fluids. Drink fluids such as: ? Water. Do not drink only water by itself. Doing that can make the salt (sodium) level in your body get too low (hyponatremia). ? Ice chips. ? Fruit juice that you have added water to (diluted). ? Low-calorie sports drinks.  Avoid: ? Alcohol. ? Drinks that have a lot of sugar. These include high-calorie sports drinks, fruit juice that does not have water added, and soda. ? Caffeine. ? Foods that are greasy or have a lot of fat or sugar.  Take over-the-counter and prescription medicines only as told by your doctor.  Do not take salt tablets. Doing that can make the salt level in your body get too high (hypernatremia).  Eat foods that have minerals (electrolytes). Examples include bananas, oranges, potatoes, tomatoes, and spinach.  Keep all follow-up visits as told by your doctor. This is important. Contact a doctor if:  You have belly (abdominal) pain that: ? Gets worse. ? Stays in one area (localizes).  You have a rash.  You have a stiff neck.  You get angry or annoyed more easily than normal (irritability).  You are more sleepy than normal.  You have a harder time waking up than normal.  You feel: ? Weak. ? Dizzy. ? Very thirsty.  You have peed (urinated) only a small amount of very dark pee during 6-8 hours. Get help right away if:  You have symptoms of   very bad dehydration.  You cannot drink fluids without throwing up (vomiting).  Your symptoms get worse with treatment.  You have a fever.  You have a very bad headache.  You are throwing up or having watery poop (diarrhea) and it: ? Gets worse. ? Does not go away.  You have blood or something green (bile) in your throw-up.  You have blood in your poop (stool). This may cause poop to look black and tarry.  You have not peed in 6-8 hours.  You pass out (faint).  Your heart rate when you are sitting still is more than 100 beats a  minute.  You have trouble breathing. This information is not intended to replace advice given to you by your health care provider. Make sure you discuss any questions you have with your health care provider. Document Released: 06/27/2009 Document Revised: 03/20/2016 Document Reviewed: 10/25/2015 Elsevier Interactive Patient Education  2018 Elsevier Inc.   Hypokalemia Hypokalemia means that the amount of potassium in the blood is lower than normal.Potassium is a chemical that helps regulate the amount of fluid in the body (electrolyte). It also stimulates muscle tightening (contraction) and helps nerves work properly.Normally, most of the body's potassium is inside of cells, and only a very small amount is in the blood. Because the amount in the blood is so small, minor changes to potassium levels in the blood can be life-threatening. What are the causes? This condition may be caused by:  Antibiotic medicine.  Diarrhea or vomiting. Taking too much of a medicine that helps you have a bowel movement (laxative) can cause diarrhea and lead to hypokalemia.  Chronic kidney disease (CKD).  Medicines that help the body get rid of excess fluid (diuretics).  Eating disorders, such as bulimia.  Low magnesium levels in the body.  Sweating a lot.  What are the signs or symptoms? Symptoms of this condition include:  Weakness.  Constipation.  Fatigue.  Muscle cramps.  Mental confusion.  Skipped heartbeats or irregular heartbeat (palpitations).  Tingling or numbness.  How is this diagnosed? This condition is diagnosed with a blood test. How is this treated? Hypokalemia can be treated by taking potassium supplements by mouth or adjusting the medicines that you take. Treatment may also include eating more foods that contain a lot of potassium. If your potassium level is very low, you may need to get potassium through an IV tube in one of your veins and be monitored in the  hospital. Follow these instructions at home:  Take over-the-counter and prescription medicines only as told by your health care provider. This includes vitamins and supplements.  Eat a healthy diet. A healthy diet includes fresh fruits and vegetables, whole grains, healthy fats, and lean proteins.  If instructed, eat more foods that contain a lot of potassium, such as: ? Nuts, such as peanuts and pistachios. ? Seeds, such as sunflower seeds and pumpkin seeds. ? Peas, lentils, and lima beans. ? Whole grain and bran cereals and breads. ? Fresh fruits and vegetables, such as apricots, avocado, bananas, cantaloupe, kiwi, oranges, tomatoes, asparagus, and potatoes. ? Orange juice. ? Tomato juice. ? Red meats. ? Yogurt.  Keep all follow-up visits as told by your health care provider. This is important. Contact a health care provider if:  You have weakness that gets worse.  You feel your heart pounding or racing.  You vomit.  You have diarrhea.  You have diabetes (diabetes mellitus) and you have trouble keeping your blood sugar (glucose) in your   target range. Get help right away if:  You have chest pain.  You have shortness of breath.  You have vomiting or diarrhea that lasts for more than 2 days.  You faint. This information is not intended to replace advice given to you by your health care provider. Make sure you discuss any questions you have with your health care provider. Document Released: 08/31/2005 Document Revised: 04/18/2016 Document Reviewed: 04/18/2016 Elsevier Interactive Patient Education  2018 Elsevier Inc.   

## 2018-07-26 NOTE — Progress Notes (Signed)
Given Potassium results of 2.9 to Dr. Lindi Adie. Order for IV potassium 20 meq x 1 today. Fannie caregiver requested refill on Decadron. Rx sent to phramacy.

## 2018-07-26 NOTE — Anesthesia Preprocedure Evaluation (Addendum)
Anesthesia Evaluation  Patient identified by MRN, date of birth, ID band Patient awake    Reviewed: Allergy & Precautions, NPO status , Patient's Chart, lab work & pertinent test results  History of Anesthesia Complications Negative for: history of anesthetic complications  Airway Mallampati: I  TM Distance: >3 FB Neck ROM: Full    Dental no notable dental hx. (+) Teeth Intact, Dental Advisory Given   Pulmonary neg pulmonary ROS,    Pulmonary exam normal breath sounds clear to auscultation       Cardiovascular negative cardio ROS Normal cardiovascular exam Rhythm:Regular Rate:Normal     Neuro/Psych PSYCHIATRIC DISORDERS Depression negative neurological ROS     GI/Hepatic negative GI ROS, Neg liver ROS,   Endo/Other  Cachexia, hypokalemia 2.9  Renal/GU negative Renal ROS     Musculoskeletal negative musculoskeletal ROS (+)   Abdominal (+) + scaphoid   Peds  Hematology  (+) Blood dyscrasia, anemia , Hgb 10.2, Plts 132 on 07/26/18   Anesthesia Other Findings Day of surgery medications reviewed with the patient.  Reproductive/Obstetrics Cervical CA                          Anesthesia Physical Anesthesia Plan  ASA: III  Anesthesia Plan: General   Post-op Pain Management:    Induction: Intravenous  PONV Risk Score and Plan: 3 and Treatment may vary due to age or medical condition, Dexamethasone and Ondansetron  Airway Management Planned: LMA  Additional Equipment:   Intra-op Plan:   Post-operative Plan: Extubation in OR  Informed Consent: I have reviewed the patients History and Physical, chart, labs and discussed the procedure including the risks, benefits and alternatives for the proposed anesthesia with the patient or authorized representative who has indicated his/her understanding and acceptance.   Dental advisory given  Plan Discussed with: CRNA  Anesthesia Plan Comments:  (No midazolam)      Anesthesia Quick Evaluation

## 2018-07-26 NOTE — Progress Notes (Signed)
Met with Latona's caregiver, [name], to discuss Home Health.  Fannie said she is concerned because Rakhi is not eating, drinking or sleeping.  She said they had originally refused home health because of Kade's daily radiation treatments.  Now that she is finished, she thinks it would help.  She would like Diyana to get fluids 3 times a week if possible.  She also mentioned that Floraine went out of the house without her shoes on Sunday and was hiding from her.  She thought she was going to have to call 911 when [place] came back.  She said she was gone for a few hours.  [name] would also like a social work consult from home health.  Discussed home health with [place] and she said she would think about it.

## 2018-07-27 ENCOUNTER — Ambulatory Visit: Admission: RE | Admit: 2018-07-27 | Payer: Medicaid Other | Source: Ambulatory Visit | Admitting: Radiation Oncology

## 2018-07-27 ENCOUNTER — Ambulatory Visit (HOSPITAL_BASED_OUTPATIENT_CLINIC_OR_DEPARTMENT_OTHER): Payer: Medicaid Other | Admitting: Anesthesiology

## 2018-07-27 ENCOUNTER — Encounter (HOSPITAL_BASED_OUTPATIENT_CLINIC_OR_DEPARTMENT_OTHER): Payer: Self-pay | Admitting: Emergency Medicine

## 2018-07-27 ENCOUNTER — Ambulatory Visit
Admission: RE | Admit: 2018-07-27 | Discharge: 2018-07-27 | Disposition: A | Discharge status: Home / Self Care | Payer: Medicaid Other | Source: Ambulatory Visit | Attending: Radiation Oncology | Admitting: Radiation Oncology

## 2018-07-27 ENCOUNTER — Ambulatory Visit (HOSPITAL_COMMUNITY)
Admission: RE | Admit: 2018-07-27 | Discharge: 2018-07-27 | Disposition: A | Discharge status: Home / Self Care | Payer: Medicaid Other | Source: Ambulatory Visit | Attending: Hematology and Oncology | Admitting: Hematology and Oncology

## 2018-07-27 ENCOUNTER — Other Ambulatory Visit: Payer: Self-pay

## 2018-07-27 ENCOUNTER — Ambulatory Visit (HOSPITAL_BASED_OUTPATIENT_CLINIC_OR_DEPARTMENT_OTHER)
Admission: RE | Admit: 2018-07-27 | Discharge: 2018-07-27 | Disposition: A | Discharge status: Other Acute Inpatient Hospital | Payer: Medicaid Other | Source: Ambulatory Visit | Attending: Radiation Oncology | Admitting: Radiation Oncology

## 2018-07-27 ENCOUNTER — Encounter (HOSPITAL_BASED_OUTPATIENT_CLINIC_OR_DEPARTMENT_OTHER)
Admission: RE | Disposition: A | Discharge status: Other Acute Inpatient Hospital | Payer: Self-pay | Source: Ambulatory Visit | Attending: Radiation Oncology

## 2018-07-27 VITALS — BP 127/61 | HR 60 | Resp 18

## 2018-07-27 DIAGNOSIS — Z79899 Other long term (current) drug therapy: Secondary | ICD-10-CM | POA: Insufficient documentation

## 2018-07-27 DIAGNOSIS — F329 Major depressive disorder, single episode, unspecified: Secondary | ICD-10-CM | POA: Diagnosis not present

## 2018-07-27 DIAGNOSIS — C801 Malignant (primary) neoplasm, unspecified: Secondary | ICD-10-CM

## 2018-07-27 DIAGNOSIS — C539 Malignant neoplasm of cervix uteri, unspecified: Secondary | ICD-10-CM

## 2018-07-27 DIAGNOSIS — N898 Other specified noninflammatory disorders of vagina: Secondary | ICD-10-CM | POA: Insufficient documentation

## 2018-07-27 DIAGNOSIS — M8580 Other specified disorders of bone density and structure, unspecified site: Secondary | ICD-10-CM | POA: Diagnosis not present

## 2018-07-27 DIAGNOSIS — Z510 Encounter for antineoplastic radiation therapy: Secondary | ICD-10-CM | POA: Diagnosis not present

## 2018-07-27 HISTORY — PX: TANDEM RING INSERTION: SHX6199

## 2018-07-27 LAB — POCT I-STAT 4, (NA,K, GLUC, HGB,HCT)
Glucose, Bld: 83 mg/dL (ref 70–99)
HEMATOCRIT: 24 % — AB (ref 36.0–46.0)
HEMOGLOBIN: 8.2 g/dL — AB (ref 12.0–15.0)
POTASSIUM: 3.1 mmol/L — AB (ref 3.5–5.1)
Sodium: 141 mmol/L (ref 135–145)

## 2018-07-27 SURGERY — INSERTION, UTERINE TANDEM AND RING OR CYLINDER, FOR BRACHYTHERAPY
Anesthesia: General | Site: Vagina

## 2018-07-27 MED ORDER — FENTANYL CITRATE (PF) 100 MCG/2ML IJ SOLN
25.0000 ug | INTRAMUSCULAR | Status: DC | PRN
  Filled 2018-07-27: qty 1

## 2018-07-27 MED ORDER — LIDOCAINE 2% (20 MG/ML) 5 ML SYRINGE
INTRAMUSCULAR | Status: DC | PRN
  Administered 2018-07-27: 40 mg via INTRAVENOUS

## 2018-07-27 MED ORDER — ONDANSETRON HCL 4 MG/2ML IJ SOLN
INTRAMUSCULAR | Status: DC | PRN
  Administered 2018-07-27: 4 mg via INTRAVENOUS

## 2018-07-27 MED ORDER — PROPOFOL 10 MG/ML IV BOLUS
INTRAVENOUS | Status: DC | PRN
  Administered 2018-07-27: 60 mg via INTRAVENOUS
  Administered 2018-07-27: 20 mg via INTRAVENOUS

## 2018-07-27 MED ORDER — FENTANYL CITRATE (PF) 100 MCG/2ML IJ SOLN
INTRAMUSCULAR | Status: AC
  Filled 2018-07-27: qty 2

## 2018-07-27 MED ORDER — PROPOFOL 10 MG/ML IV BOLUS
INTRAVENOUS | Status: AC
  Filled 2018-07-27: qty 20

## 2018-07-27 MED ORDER — LACTATED RINGERS IV SOLN
INTRAVENOUS | Status: DC
  Administered 2018-07-27: 07:00:00 via INTRAVENOUS
  Filled 2018-07-27: qty 1000

## 2018-07-27 MED ORDER — KETOROLAC TROMETHAMINE 30 MG/ML IJ SOLN
INTRAMUSCULAR | Status: DC | PRN
  Administered 2018-07-27: 15 mg via INTRAVENOUS

## 2018-07-27 MED ORDER — LIDOCAINE 2% (20 MG/ML) 5 ML SYRINGE
INTRAMUSCULAR | Status: AC
  Filled 2018-07-27: qty 5

## 2018-07-27 MED ORDER — ACETAMINOPHEN 325 MG PO TABS
325.0000 mg | ORAL_TABLET | ORAL | Status: DC | PRN
  Filled 2018-07-27: qty 2

## 2018-07-27 MED ORDER — DEXAMETHASONE SODIUM PHOSPHATE 10 MG/ML IJ SOLN
INTRAMUSCULAR | Status: AC
  Filled 2018-07-27: qty 1

## 2018-07-27 MED ORDER — MIDAZOLAM HCL 2 MG/2ML IJ SOLN
INTRAMUSCULAR | Status: AC
  Filled 2018-07-27: qty 2

## 2018-07-27 MED ORDER — LACTATED RINGERS IV SOLN
INTRAVENOUS | Status: DC
  Administered 2018-07-27: 11:00:00 via INTRAVENOUS
  Filled 2018-07-27 (×2): qty 250

## 2018-07-27 MED ORDER — ACETAMINOPHEN 160 MG/5ML PO SOLN
325.0000 mg | ORAL | Status: DC | PRN
  Filled 2018-07-27: qty 20.3

## 2018-07-27 MED ORDER — KETOROLAC TROMETHAMINE 30 MG/ML IJ SOLN
INTRAMUSCULAR | Status: AC
  Filled 2018-07-27: qty 1

## 2018-07-27 MED ORDER — FENTANYL CITRATE (PF) 100 MCG/2ML IJ SOLN
INTRAMUSCULAR | Status: DC | PRN
  Administered 2018-07-27 (×2): 25 ug via INTRAVENOUS

## 2018-07-27 SURGICAL SUPPLY — 25 items
BAG URINE DRAINAGE (UROLOGICAL SUPPLIES) ×1 IMPLANT
BNDG CONFORM 2 STRL LF (GAUZE/BANDAGES/DRESSINGS) IMPLANT
CATH FOLEY 2WAY SLVR  5CC 16FR (CATHETERS)
CATH FOLEY 2WAY SLVR 5CC 16FR (CATHETERS) ×1 IMPLANT
COVER WAND RF STERILE (DRAPES) ×4 IMPLANT
DILATOR CANAL MILEX (MISCELLANEOUS) IMPLANT
GAUZE 4X4 16PLY RFD (DISPOSABLE) ×3 IMPLANT
GLOVE BIO SURGEON STRL SZ7 (GLOVE) ×2 IMPLANT
GLOVE BIO SURGEON STRL SZ7.5 (GLOVE) ×6 IMPLANT
GLOVE BIOGEL PI IND STRL 7.5 (GLOVE) IMPLANT
GLOVE BIOGEL PI INDICATOR 7.5 (GLOVE) ×2
GOWN STRL REUS W/TWL LRG LVL3 (GOWN DISPOSABLE) ×5 IMPLANT
HOLDER FOLEY CATH W/STRAP (MISCELLANEOUS) ×3 IMPLANT
HOVERMATT SINGLE USE (MISCELLANEOUS) ×3 IMPLANT
IV NS 1000ML (IV SOLUTION) ×3
IV NS 1000ML BAXH (IV SOLUTION) ×1 IMPLANT
KIT TURNOVER CYSTO (KITS) ×3 IMPLANT
PACK VAGINAL MINOR WOMEN LF (CUSTOM PROCEDURE TRAY) ×3 IMPLANT
PACKING VAGINAL (PACKING) IMPLANT
PAD ABD 8X10 STRL (GAUZE/BANDAGES/DRESSINGS) ×3 IMPLANT
PAD OB MATERNITY 4.3X12.25 (PERSONAL CARE ITEMS) IMPLANT
SET IRRIG Y TYPE TUR BLADDER L (SET/KITS/TRAYS/PACK) ×1 IMPLANT
TOWEL OR 17X24 6PK STRL BLUE (TOWEL DISPOSABLE) ×4 IMPLANT
TRAY FOLEY W/BAG SLVR 14FR (SET/KITS/TRAYS/PACK) ×2 IMPLANT
WATER STERILE IRR 500ML POUR (IV SOLUTION) ×3 IMPLANT

## 2018-07-27 NOTE — Transfer of Care (Signed)
Immediate Anesthesia Transfer of Care Note  Patient: Erin Kent  Procedure(s) Performed: TANDEM RING INSERTION (N/A Vagina )  Patient Location: PACU  Anesthesia Type:General  Level of Consciousness: drowsy and responds to stimulation  Airway & Oxygen Therapy: Patient Spontanous Breathing and Patient connected to nasal cannula oxygen  Post-op Assessment: Report given to RN  Post vital signs: Reviewed and stable  Last Vitals:  Vitals Value Taken Time  BP 136/77 07/27/2018  9:12 AM  Temp    Pulse    Resp 11 07/27/2018  9:13 AM  SpO2    Vitals shown include unvalidated device data.  Last Pain:  Vitals:   07/27/18 0625  TempSrc: Oral      Patients Stated Pain Goal: 3 (73/66/81 5947)  Complications: No apparent anesthesia complications

## 2018-07-27 NOTE — Progress Notes (Signed)
  Radiation Oncology         (336) [phone] ________________________________  Name: Erin Kent MRN: [id number]  Date: 07/27/2018  DOB: Jun 14, 1951  CC: Patient, [name]  Isabel Caprice, MD  HDR BRACHYTHERAPY NOTE  DIAGNOSIS: Stage IIIC1,invasive squamous cell carcinoma to cervix(FIGO 2018 Staging)  NARRATIVE: The patient was brought to the [facility] suite. Identity was confirmed. All relevant records and images related to the planned course of therapy were reviewed. The patient freely provided informed written consent to proceed with treatment after reviewing the details related to the planned course of therapy. The consent form was witnessed and verified by the simulation staff. Then, the patient was set-up in a stable reproducible supine position for radiation therapy. The tandem cylinder system was accessed and fiducial markers were placed within the tandem and cylinder.   Simple treatment device note: On the operating room the patient had construction of her custom tandem cylinder system. She will be treated with a 0 tandem/cylinder system. The patient had placement of a 2.0 cm cylinder.   Verification simulation note: An AP and lateral film was obtained through the pelvis area. This was compared to the patient's planning films documenting accurate position of the tandem/cylinder system for treatment.  High-dose-rate brachytherapy treatment note:  The remote afterloading device was accessed through catheter system and attached to the tandem cylinder system. Patient then proceeded to undergo her fifth high-dose-rate treatment directed at the cervix. The patient was prescribed a dose of 5.5 gray to be delivered to the [place]. Patient was treated with 1 channel using 12 dwell positions. Treatment time was 179.2 seconds. The patient tolerated the procedure well. After completion of her therapy, a radiation survey was performed documenting return of the iridium source into the [employer] safe.  The patient was then transferred to the nursing suite. She then had removal of  the tandem and cylinder system. The patient tolerated the removal well.  PLAN: patient has completed her radiation therapy. She will return for routine follow-up in one month. ________________________________   Blair Promise, PhD, MD  This document serves as a record of services personally performed by Gery Pray, MD. It was created on his behalf by Mary-Margaret Loma Messing, a trained medical scribe. The creation of this record is based on the scribe's personal observations and the provider's statements to them. This document has been checked and approved by the attending provider.

## 2018-07-27 NOTE — Discharge Instructions (Signed)
°  Post Anesthesia Home Care Instructions  Activity: Get plenty of rest for the remainder of the day. A responsible adult should stay with you for 24 hours following the procedure.  For the next 24 hours, DO NOT: -Drive a car -Paediatric nurse -Drink alcoholic beverages -Take any medication unless instructed by your physician -Make any legal decisions or sign important papers.  Meals: Start with liquid foods such as gelatin or soup. Progress to regular foods as tolerated. Avoid greasy, spicy, heavy foods. If nausea and/or vomiting occur, drink only clear liquids until the nausea and/or vomiting subsides. Call your physician if vomiting continues.  Special Instructions/Symptoms: Your throat may feel dry or sore from the anesthesia or the breathing tube placed in your throat during surgery. If this causes discomfort, gargle with warm salt water. The discomfort should disappear within 24 hours.  If you had a scopolamine patch placed behind your ear for the management of post- operative nausea and/or vomiting:  1. The medication in the patch is effective for 72 hours, after which it should be removed.  Wrap patch in a tissue and discard in the trash. Wash hands thoroughly with soap and water. 2. You may remove the patch earlier than 72 hours if you experience unpleasant side effects which may include dry mouth, dizziness or visual disturbances. 3. Avoid touching the patch. Wash your hands with soap and water after contact with the patch.   NO ADVIL, ALEVE, MOTRIN, IBUPROFEN UNTIL 3 PM TODAY  Call your surgeon if you experience:   1.  Fever over 101.0. 2.  Inability to urinate. 3.  Nausea and/or vomiting. 4.  Extreme swelling or bruising at the surgical site. 5.  Continued bleeding from the incision. 6.  Increased pain, redness or drainage from the incision. 7.  Problems related to your pain medication. 8.  Any problems and/or concerns

## 2018-07-27 NOTE — Anesthesia Procedure Notes (Signed)
Procedure Name: LMA Insertion Date/Time: 07/27/2018 8:41 AM Performed by: Bonney Aid, CRNA Pre-anesthesia Checklist: Patient identified, Emergency Drugs available, Suction available and Patient being monitored Patient Re-evaluated:Patient Re-evaluated prior to induction Oxygen Delivery Method: Circle system utilized Preoxygenation: Pre-oxygenation with 100% oxygen Induction Type: IV induction Ventilation: Mask ventilation without difficulty LMA: LMA inserted LMA Size: 3.0 Number of attempts: 1 Airway Equipment and Method: Bite block Placement Confirmation: positive ETCO2 Tube secured with: Tape Dental Injury: Teeth and Oropharynx as per pre-operative assessment

## 2018-07-27 NOTE — Progress Notes (Signed)
  Radiation Oncology         [phone]) [phone] ________________________________  Name: Erin Kent MRN: [id number]  Date: 07/27/2018  DOB: Jan 23, 1951  SIMULATION AND TREATMENT PLANNING NOTE HDR BRACHYTHERAPY  DIAGNOSIS:  FIGO Stage IIIC1, invasive squamous cell carcinoma to cervix  NARRATIVE:  The patient was brought to the [facility].  Identity was confirmed.  All relevant records and images related to the planned course of therapy were reviewed.  The patient freely provided informed written consent to proceed with treatment after reviewing the details related to the planned course of therapy. The consent form was witnessed and verified by the simulation staff.  Then, the patient was set-up in a stable reproducible  supine position for radiation therapy.  CT images were obtained.  Surface markings were placed.  The CT images were loaded into the planning software.  Then the target and avoidance structures were contoured.  Treatment planning then occurred.  The radiation prescription was entered and confirmed.   I have requested : Brachytherapy Isodose Plan and Dosimetry Calculations to plan the radiation distribution.    PLAN:  The patient will receive 5.5 Gy in 1 fraction. Iridium 192 will be the high-dose-rate source. The patient will be treated with the tandem cylinder applicator.    ________________________________  Blair Promise, PhD, MD   This document serves as a record of services personally performed by Gery Pray, MD. It was created on his behalf by Mary-Margaret Loma Messing, a trained medical scribe. The creation of this record is based on the scribe's personal observations and the provider's statements to them. This document has been checked and approved by the attending provider.

## 2018-07-27 NOTE — H&P (Signed)
Radiation Oncology         (336) [phone] ________________________________  History and physical examination  Name: Erin Kent MRN: [id number]  Date: 06/26/18  DOB: 12-07-1950    REFERRING PHYSICIAN: Precious Haws  DIAGNOSIS: FIGO Stage IIIC1, invasive squamous cell carcinoma to cervix (FIGO 2018 staging)   HISTORY OF PRESENT ILLNESS::Erin Kent is a 67 y.o. female who is presenting to the office today for evaluation of newly diagnosed cervical cancer. She is accompanied by her roommate, Erin Kent. She initially presented with vaginal bleeding and was evaluated in the Emergency Department on 03/13/2018. She notes that she had trace vaginal bleeding prior to this episode. She is currently having to use a pad to collect the drainage from her vagina. She denies back pain or abdominal pain. She has lost 10 pounds, however, she hasn't weighed herself in the past year. The patient has a lack of appetite and her roommate ensures that she takes a multivitamin, iron supplements, and ensure. 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 She lives in the [place] area.   She has underwent a routine pap smear on 04/12/2018 that showed: squamous cell carcinoma. On pathology it was noted: Cervix, biopsy, mass with invasive squamous cell carcinoma.   She then continued to a PET scan on 04/25/2018 that showed: Large cervical mass may invade into the myometrium in down into the vagina, maximum SUV 21.3. There is a malignant left external iliac node measuring 1.4 cm in short axis with maximum SUV 14.1. Accentuated activity in the cecum and ascending colon is likely physiologic given that it has no CT correlate. Correlation with the patient's colon cancer screening history is recommended. Aortic Atherosclerosis (ICD10-I70.0).  On review of systems, she reports mild vaginal discharge s/p recent procedure. she denies HA, double vision, and any other symptoms. She denies family hx of breast  or ovarian cancer. She doesn't have a PCP at this time, however, her roommate Erin Kent is in the process of obtaining a PCP for her.  The patient has completed most of her external beam radiation therapy and radiosensitizing chemotherapy. She now presents to the operating room for placement of brachytherapy equipment in preparation for her fifth high-dose-rate treatment.   PREVIOUS RADIATION THERAPY: No  PAST MEDICAL HISTORY:  has a past medical history of Aortic atherosclerosis ([place]), Diarrhea, History of cancer chemotherapy (05-13-2018 to 06-24-2018), History of external beam radiation therapy, Hypokalemia, Hypomagnesemia, Iron deficiency anemia, Malignant neoplasm cervix [facility]) (oncologist-- dr Clemon Chambers), MDD (major depressive disorder), Osteopenia, Ovarian cyst, Pancytopenia, acquired ([place]), Poor appetite, and Port-A-Cath in place.    PAST SURGICAL HISTORY: Past Surgical History:  Procedure Laterality Date  . CYSTOSCOPY N/A 04/28/2018   Procedure: CYSTOSCOPY;  Surgeon: Isabel Caprice, MD;  Location: [facility];  Service: Gynecology;  Laterality: N/A;  . EUA/ PAPSMEAR/  CERVICAL MASS BIOPSY  04-12-2018   dr peggy constant @[facility]   . IR IMAGING GUIDED PORT INSERTION  05/12/2018  . TANDEM RING INSERTION N/A 06/27/2018   Procedure: TANDEM RING INSERTION;  Surgeon: Gery Pray, MD;  Location: [facility];  Service: Urology;  Laterality: N/A;  . TANDEM RING INSERTION N/A 07/04/2018   Procedure: TANDEM RING INSERTION;  Surgeon: Gery Pray, MD;  Location: [facility];  Service: Urology;  Laterality: N/A;  . TANDEM RING INSERTION N/A 07/14/2018   Procedure: TANDEM RING INSERTION;  Surgeon: Gery Pray, MD;  Location: [facility];  Service: Urology;  Laterality:  N/A;  . TANDEM RING INSERTION N/A 07/18/2018   Procedure: TANDEM CYLINDER INSERTION;  Surgeon: Gery Pray, MD;  Location: [facility];  Service: Urology;   Laterality: N/A;  . WISDOM TOOTH EXTRACTION      FAMILY HISTORY: family history includes Hypertension in her mother.  SOCIAL HISTORY:  reports that she has never smoked. She has never used smokeless tobacco. She reports that she does not drink alcohol or use drugs.  ALLERGIES: Patient has no known allergies.  MEDICATIONS:  Current Facility-Administered Medications  Medication Dose Route Frequency Provider Last Rate Last Dose  . lactated ringers infusion   Intravenous Continuous Brennan Bailey, MD 50 mL/hr at 07/27/18 3532     Facility-Administered Medications Ordered in Other Encounters  Medication Dose Route Frequency Provider Last Rate Last Dose  . sodium chloride 0.9 % 1,000 mL with potassium chloride 20 mEq infusion   Intravenous Continuous [name], MD   Stopped at 06/09/18 1406    REVIEW OF SYSTEMS:  A 10+ POINT REVIEW OF SYSTEMS WAS OBTAINED including neurology, dermatology, psychiatry, cardiac, respiratory, lymph, extremities, GI, GU, musculoskeletal, constitutional, reproductive, HEENT. All pertinent positives are noted in the HPI. All others are negative.    PHYSICAL EXAM:  height is 5\' 2"  (1.575 m) and weight is 81 lb 3.2 oz (36.8 kg). Her oral temperature is 97.9 F (36.6 C). Her blood pressure is 123/82 and her pulse is 72. Her respiration is 14 and oxygen saturation is 100%.   General: Alert and oriented, in no acute distress. Emaciated appearance.  HEENT: Head is normocephalic. Extraocular movements are intact. Oropharynx is clear. Neck: Neck is supple, no palpable cervical or supraclavicular lymphadenopathy. Heart: Regular in rate and rhythm with no murmurs, rubs, or gallops. Chest: Clear to auscultation bilaterally, with no rhonchi, wheezes, or rales. Abdomen: Soft, nontender, nondistended, with no rigidity or guarding. Extremities: No cyanosis or edema. Lymphatics: see Neck Exam Skin: No concerning lesions. Musculoskeletal: symmetric strength and muscle tone  throughout. Neurologic: Cranial nerves II through XII are grossly intact. No obvious focalities. Speech is fluent. Coordination is intact. Psychiatric: Judgment and insight are intact. Affect is appropriate. Pelvic: The patient wasn't able to tolerate a pelvic exam due to pain. I was able to place an index finger in the vaginal vault and palpable tumor extended along the anterior vaginal wall to approximately the lower 1/3 of the vagina. No palpable inguinal adenopathy.   Exam under anesthesia by Dr. Gerarda Fraction revealed: Vulva: Normal external female genitalia.  Bladder/urethra: Urethral meatus normal in size and location. No lesions or masses, well supported bladder   Operative Findings:   1. Parametrial involvement on right  2. Right sided subvaginal tumor burden ~3cm. This approximates the right ureteral insertion into the bladder based on my exam during cystoscopy. 3. Extension of disease down upper half of vagina on lateral and posterior walls. 4. Extension of disease down to lower third of anterior vagina (versus skip lesion) thus making her Stage 3A 5. Cystoscopy revealed patent bilateral ureteral orifices and no bladder mucosa invasion or areas of concern. Rectal exam grossly no disease.    ECOG = 1  0 - Asymptomatic (Fully active, able to carry on all predisease activities without restriction)  1 - Symptomatic but completely ambulatory (Restricted in physically strenuous activity but ambulatory and able to carry out work of a light or sedentary nature. For example, light housework, office work)  2 - Symptomatic, <50% in bed during the day (Ambulatory and capable of  all self care but unable to carry out any work activities. Up and about more than 50% of waking hours)  3 - Symptomatic, >50% in bed, but not bedbound (Capable of only limited self-care, confined to bed or chair 50% or more of waking hours)  4 - Bedbound (Completely disabled. Cannot carry on any self-care. Totally confined  to bed or chair)  5 - Death   Eustace Pen MM, Creech RH, Tormey DC, et al. [phone]). "Toxicity and response criteria of the [facility]". [place] Oncol. 5 (6): 649-55  LABORATORY DATA:  Lab Results  Component Value Date   WBC 4.1 07/26/2018   HGB 10.2 (L) 07/26/2018   HCT 31.7 (L) 07/26/2018   MCV 91.9 07/26/2018   PLT 132 (L) 07/26/2018   NEUTROABS 3.2 07/26/2018   Lab Results  Component Value Date   NA 143 07/26/2018   K 2.9 (LL) 07/26/2018   CL 105 07/26/2018   CO2 29 07/26/2018   GLUCOSE 122 (H) 07/26/2018   CREATININE 0.80 07/26/2018   CALCIUM 8.6 (L) 07/26/2018      RADIOGRAPHY: [place] Intraoperative  Result Date: 07/18/2018 CLINICAL DATA:  Ultrasound was provided for use by the ordering physician, and a technical charge was applied by the performing facility.  No radiologist interpretation/professional services rendered.   [place] Intraoperative  Result Date: 07/14/2018 CLINICAL DATA:  Ultrasound was provided for use by the ordering physician, and a technical charge was applied by the performing facility.  No radiologist interpretation/professional services rendered.   [place] Intraoperative  Result Date: 07/04/2018 CLINICAL DATA:  Ultrasound was provided for use by the ordering physician, and a technical charge was applied by the performing facility.  No radiologist interpretation/professional services rendered.   [place] Intraoperative  Result Date: 06/27/2018 CLINICAL DATA:  Ultrasound was provided for use by the ordering physician, and a technical charge was applied by the performing facility.  No radiologist interpretation/professional services rendered.      IMPRESSION: FIGO Stage IIIC1 Invasive squamous cell carcinoma to cervix.    Patient will be a good candidate for definitive course of radiation therapy along with radio sensitizing chemotherapy, radiation therapy would consist of 6 weeks external beam radiation therapy and 5 intracavitary  brachytherapy treatments. patient is now ready to proceed with her brachytherapy treatments.    PLAN: she will be taken to the operating room on November 13 at 8:30 AM for exam under anesthesia and placement of brachytherapy equipment in preparation for high-dose rate radiation therapy using iridium 192 as the high-dose-rate source.  she will have placement of a  a tandem cylinder apparatus. Received IV K+ supplement in cancer ctr yesterday.    ------------------------------------------------  Blair Promise, PhD, MD      This document serves as a record of services personally performed by Gery Pray, MD. It was created on his behalf by [facility], a trained medical scribe. The creation of this record is based on the scribe's personal observations and the provider's statements to them. This document has been checked and approved by the attending provider.

## 2018-07-27 NOTE — Progress Notes (Signed)
Received pt from [facility] PACU and transported pt to [place]. IVF infusing via PIV. Foley patent, draining minimal amount clear yellow urine. Pt awake and aware of surroundings. Loma Sousa, RN BSN   Pt received from [place] into room 1. IVF insuing without difficulty. Foley patent, draining minimal clear yellow urine. Breakfast tray ordered. Pt denies c/o pain. Loma Sousa, RN BSN   This RN to [employer]. Dr. Sondra Come removed segmented cervical applicator device without difficulty. Pt transported by this RN to nursing room 1. Foley catheter removed without difficulty. PIV removed, catheter tip intact. Assisted pt with dressing. Pt transported to door in [facility]. Discharged with instructions to caregiver, [name]. Loma Sousa, RN BSN

## 2018-07-27 NOTE — Interval H&P Note (Signed)
History and Physical Interval Note:  07/27/2018 8:25 AM  Erin Kent  has presented today for surgery, with the diagnosis of cervical cancer  The various methods of treatment have been discussed with the patient and family. After consideration of risks, benefits and other options for treatment, the patient has consented to  Procedure(s): TANDEM RING INSERTION (N/A) as a surgical intervention .  The patient's history has been reviewed, patient examined, no change in status, stable for surgery.  I have reviewed the patient's chart and labs.  Questions were answered to the patient's satisfaction.     Gery Pray

## 2018-07-27 NOTE — Anesthesia Postprocedure Evaluation (Signed)
Anesthesia Post Note  Patient: Erin Kent  Procedure(s) Performed: TANDEM RING INSERTION (N/A Vagina )     Patient location during evaluation: PACU Anesthesia Type: General Level of consciousness: awake and alert Pain management: pain level controlled Vital Signs Assessment: post-procedure vital signs reviewed and stable Respiratory status: spontaneous breathing, nonlabored ventilation and respiratory function stable Cardiovascular status: blood pressure returned to baseline and stable Postop Assessment: no apparent nausea or vomiting Anesthetic complications: no    Last Vitals:  Vitals:   07/27/18 0912 07/27/18 0913  BP: 136/77   Pulse:    Resp: 11   Temp:  36.4 C  SpO2: 100% 100%    Last Pain:  Vitals:   07/27/18 0912  TempSrc:   PainSc: 0-No pain                 Brennan Bailey

## 2018-07-27 NOTE — Op Note (Signed)
07/27/2018  9:45 AM  PATIENT:  Erin Kent  67 y.o. female  PRE-OPERATIVE DIAGNOSIS:  cervical cancer  POST-OPERATIVE DIAGNOSIS:  cervical cancer  PROCEDURE:  Procedure(s): TANDEM RING INSERTION (N/A)  SURGEON:  Surgeon(s) and Role:    * Gery Pray, MD - Primary  PHYSICIAN ASSISTANT:   ASSISTANTS: none   ANESTHESIA:   general  EBL: 0 ml  BLOOD ADMINISTERED:none  DRAINS: Urinary Catheter (Foley)   LOCAL MEDICATIONS USED:  NONE  SPECIMEN:  No Specimen  DISPOSITION OF SPECIMEN:  N/A  COUNTS:  YES  TOURNIQUET:  * No tourniquets in log *  DICTATION: the patient was taken tooutpatient room#3. A general anesthetic was applied. Patient was prepped and draped in the usual sterile fashion and placed in the dorsolithotomy position. A time outforthe procedure,anticipated length of the procedure and preoperative medications was performed prior to initiation of the procedure. Exam under anesthesia showed excellent response to the patient's prior external beam and radiosensitizing chemotherapy. The cervix was essentially normal in size.No bleeding with the procedure today. Review of the vaginal vault revealed no obvious residual tumor along the anterior distal vaginal cavity. Patient did have a Foley catheter placed and this was used for ultrasound imaging. The Foley catheter was back filled with approximately 200 mL of sterile water.Goodimaging was obtained. The uterus was noted to be quite small and mildly anteverted. The uterus was estimated to be approximately 4.5 cm in length. Patient proceeded to undergo dilation of the cervical os. Given the narrowed vaginal cavity patient could not have placement of the standard ring for high-dose rate radiation therapy. Was decided that point that the patient should be treated with a tandem cylinder apparatus. Patient had a 040 mm tandem placed within the cavity showing accurate placement within the endometrial canal. Patient then had a  2.0cm diametercylinder attached to the tandem.Patient tolerated the procedure well and subsequentlytransported to the recovery room in stable condition. Later today the patient will undergo planning and herfifthhigh-dose-rate treatment directed at the cervix. This will complete the patient's definitive course of radiation along with radiosensitizing chemotherapy.  PLAN OF CARE: transferred to radiation oncology for planning and treatment  PATIENT DISPOSITION:  PACU - hemodynamically stable.   Delay start of Pharmacological VTE agent (>24hrs) due to surgical blood loss or risk of bleeding: not applicable

## 2018-07-28 ENCOUNTER — Encounter (HOSPITAL_BASED_OUTPATIENT_CLINIC_OR_DEPARTMENT_OTHER): Payer: Self-pay | Admitting: Radiation Oncology

## 2018-07-28 ENCOUNTER — Telehealth: Payer: Self-pay | Admitting: Oncology

## 2018-07-28 ENCOUNTER — Encounter: Payer: Self-pay | Admitting: Radiation Oncology

## 2018-07-28 NOTE — Telephone Encounter (Signed)
Erin Kent called and said she talked to [facility] and found out she would be taught how to access Devoiry's port and give her fluids.  She said she is not willing to do that.  She would like for Shyasia to come to the cancer center for fluids three times a week (M, W, F).

## 2018-07-28 NOTE — Progress Notes (Signed)
  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 pelvis, 1.8 Gy in 25 fractions for a total dose of 45 Gy         2. pelvic Boost, 1.8 Gy in 5 fractions for a total dose of 9 Gy         3. Cervix, 5.5 Gy in 5 fractions for a total dose of 27.5 Gy  Beams/energy:   1. IMRT, 6X         2. IMRT, 15X         3. HDR Ir-Cervix, Iridium HDR, patient was treated with a 2.0 cm diameter tandem/cylinder, prescription was 5.5 gray to the HRCTV.   She also had radiosensitizing chemotherapy within the first 5 weeks of radiation.    Narrative: The patient tolerated radiation treatment relatively well. At the beginning of treatment, pt denied any symptoms. Pt reported that her diarrhea was improved with imodium. She denied fatigue, dysuria, hematuria, vaginal discharge/bleeding.   Plan: The patient has completed radiation treatment. The patient will return to radiation oncology clinic for routine followup in one month. I advised them to call or return sooner if they have any questions or concerns related to their recovery or treatment.  -----------------------------------  Blair Promise, PhD, MD  This document serves as a record of services personally performed by Gery Pray, MD. It was created on his behalf by [facility], a trained medical scribe. The creation of this record is based on the scribe's personal observations and the provider's statements to them. This document has been checked and approved by the attending provider.

## 2018-07-29 ENCOUNTER — Encounter: Payer: Self-pay | Admitting: Oncology

## 2018-07-29 ENCOUNTER — Inpatient Hospital Stay: Payer: Medicaid Other

## 2018-07-29 ENCOUNTER — Other Ambulatory Visit: Payer: Self-pay | Admitting: Oncology

## 2018-07-29 VITALS — BP 124/64 | HR 70 | Temp 98.0°F | Resp 18

## 2018-07-29 DIAGNOSIS — C539 Malignant neoplasm of cervix uteri, unspecified: Secondary | ICD-10-CM

## 2018-07-29 DIAGNOSIS — C538 Malignant neoplasm of overlapping sites of cervix uteri: Secondary | ICD-10-CM | POA: Diagnosis not present

## 2018-07-29 MED ORDER — SODIUM CHLORIDE 0.9 % IV SOLN
Freq: Once | INTRAVENOUS | Status: AC
  Administered 2018-07-29: 14:00:00 via INTRAVENOUS
  Filled 2018-07-29: qty 250

## 2018-07-29 MED ORDER — SODIUM CHLORIDE 0.9 % IV SOLN
INTRAVENOUS | Status: DC
  Filled 2018-07-29: qty 250

## 2018-07-29 MED ORDER — SODIUM CHLORIDE 0.9% FLUSH
10.0000 mL | Freq: Once | INTRAVENOUS | Status: AC | PRN
  Administered 2018-07-29: 10 mL
  Filled 2018-07-29: qty 10

## 2018-07-29 MED ORDER — HEPARIN SOD (PORK) LOCK FLUSH 100 UNIT/ML IV SOLN
500.0000 [IU] | Freq: Once | INTRAVENOUS | Status: AC | PRN
  Administered 2018-07-29: 500 [IU]
  Filled 2018-07-29: qty 5

## 2018-07-29 NOTE — Progress Notes (Signed)
Reviewed new schedule (M, W, F) fluids with [place].

## 2018-07-29 NOTE — Patient Instructions (Signed)
Dehydration, Adult Dehydration is when there is not enough fluid or water in your body. This happens when you lose more fluids than you take in. Dehydration can range from mild to very bad. It should be treated right away to keep it from getting very bad. Symptoms of mild dehydration may include:  Thirst.  Dry lips.  Slightly dry mouth.  Dry, warm skin.  Dizziness. Symptoms of moderate dehydration may include:  Very dry mouth.  Muscle cramps.  Dark pee (urine). Pee may be the color of tea.  Your body making less pee.  Your eyes making fewer tears.  Heartbeat that is uneven or faster than normal (palpitations).  Headache.  Light-headedness, especially when you stand up from sitting.  Fainting (syncope). Symptoms of very bad dehydration may include:  Changes in skin, such as: ? Cold and clammy skin. ? Blotchy (mottled) or pale skin. ? Skin that does not quickly return to normal after being lightly pinched and let go (poor skin turgor).  Changes in body fluids, such as: ? Feeling very thirsty. ? Your eyes making fewer tears. ? Not sweating when body temperature is high, such as in hot weather. ? Your body making very little pee.  Changes in vital signs, such as: ? Weak pulse. ? Pulse that is more than 100 beats a minute when you are sitting still. ? Fast breathing. ? Low blood pressure.  Other changes, such as: ? Sunken eyes. ? Cold hands and feet. ? Confusion. ? Lack of energy (lethargy). ? Trouble waking up from sleep. ? Short-term weight loss. ? Unconsciousness. Follow these instructions at home:  If told by your doctor, drink an ORS: ? Make an ORS by using instructions on the package. ? Start by drinking small amounts, about  cup (120 mL) every 5-10 minutes. ? Slowly drink more until you have had the amount that your doctor said to have.  Drink enough clear fluid to keep your pee clear or pale yellow. If you were told to drink an ORS, finish the ORS  first, then start slowly drinking clear fluids. Drink fluids such as: ? Water. Do not drink only water by itself. Doing that can make the salt (sodium) level in your body get too low (hyponatremia). ? Ice chips. ? Fruit juice that you have added water to (diluted). ? Low-calorie sports drinks.  Avoid: ? Alcohol. ? Drinks that have a lot of sugar. These include high-calorie sports drinks, fruit juice that does not have water added, and soda. ? Caffeine. ? Foods that are greasy or have a lot of fat or sugar.  Take over-the-counter and prescription medicines only as told by your doctor.  Do not take salt tablets. Doing that can make the salt level in your body get too high (hypernatremia).  Eat foods that have minerals (electrolytes). Examples include bananas, oranges, potatoes, tomatoes, and spinach.  Keep all follow-up visits as told by your doctor. This is important. Contact a doctor if:  You have belly (abdominal) pain that: ? Gets worse. ? Stays in one area (localizes).  You have a rash.  You have a stiff neck.  You get angry or annoyed more easily than normal (irritability).  You are more sleepy than normal.  You have a harder time waking up than normal.  You feel: ? Weak. ? Dizzy. ? Very thirsty.  You have peed (urinated) only a small amount of very dark pee during 6-8 hours. Get help right away if:  You have symptoms of   very bad dehydration.  You cannot drink fluids without throwing up (vomiting).  Your symptoms get worse with treatment.  You have a fever.  You have a very bad headache.  You are throwing up or having watery poop (diarrhea) and it: ? Gets worse. ? Does not go away.  You have blood or something green (bile) in your throw-up.  You have blood in your poop (stool). This may cause poop to look black and tarry.  You have not peed in 6-8 hours.  You pass out (faint).  Your heart rate when you are sitting still is more than 100 beats a  minute.  You have trouble breathing. This information is not intended to replace advice given to you by your health care provider. Make sure you discuss any questions you have with your health care provider. Document Released: 06/27/2009 Document Revised: 03/20/2016 Document Reviewed: 10/25/2015 Elsevier Interactive Patient Education  2018 [employer]   Hypokalemia Hypokalemia means that the amount of potassium in the blood is lower than normal.Potassium is a chemical that helps regulate the amount of fluid in the body (electrolyte). It also stimulates muscle tightening (contraction) and helps nerves work properly.Normally, most of the body's potassium is inside of cells, and only a very small amount is in the blood. Because the amount in the blood is so small, minor changes to potassium levels in the blood can be life-threatening. What are the causes? This condition may be caused by:  Antibiotic medicine.  Diarrhea or vomiting. Taking too much of a medicine that helps you have a bowel movement (laxative) can cause diarrhea and lead to hypokalemia.  Chronic kidney disease (CKD).  Medicines that help the body get rid of excess fluid (diuretics).  Eating disorders, such as bulimia.  Low magnesium levels in the body.  Sweating a lot.  What are the signs or symptoms? Symptoms of this condition include:  Weakness.  Constipation.  Fatigue.  Muscle cramps.  Mental confusion.  Skipped heartbeats or irregular heartbeat (palpitations).  Tingling or numbness.  How is this diagnosed? This condition is diagnosed with a blood test. How is this treated? Hypokalemia can be treated by taking potassium supplements by mouth or adjusting the medicines that you take. Treatment may also include eating more foods that contain a lot of potassium. If your potassium level is very low, you may need to get potassium through an IV tube in one of your veins and be monitored in the  hospital. Follow these instructions at home:  Take over-the-counter and prescription medicines only as told by your health care provider. This includes vitamins and supplements.  Eat a healthy diet. A healthy diet includes fresh fruits and vegetables, whole grains, healthy fats, and lean proteins.  If instructed, eat more foods that contain a lot of potassium, such as: ? Nuts, such as peanuts and pistachios. ? Seeds, such as sunflower seeds and pumpkin seeds. ? Peas, lentils, and lima beans. ? Whole grain and bran cereals and breads. ? Fresh fruits and vegetables, such as apricots, avocado, bananas, cantaloupe, kiwi, oranges, tomatoes, asparagus, and potatoes. ? Orange juice. ? Tomato juice. ? Red meats. ? Yogurt.  Keep all follow-up visits as told by your health care provider. This is important. Contact a health care provider if:  You have weakness that gets worse.  You feel your heart pounding or racing.  You vomit.  You have diarrhea.  You have diabetes (diabetes mellitus) and you have trouble keeping your blood sugar (glucose) in your   target range. Get help right away if:  You have chest pain.  You have shortness of breath.  You have vomiting or diarrhea that lasts for more than 2 days.  You faint. This information is not intended to replace advice given to you by your health care provider. Make sure you discuss any questions you have with your health care provider. Document Released: 08/31/2005 Document Revised: 04/18/2016 Document Reviewed: 04/18/2016 Elsevier Interactive Patient Education  2018 Elsevier Inc.   

## 2018-08-01 ENCOUNTER — Inpatient Hospital Stay: Payer: Medicaid Other

## 2018-08-01 ENCOUNTER — Ambulatory Visit (HOSPITAL_COMMUNITY)
Admission: RE | Admit: 2018-08-01 | Discharge: 2018-08-01 | Disposition: A | Discharge status: Home / Self Care | Payer: Medicaid Other | Source: Ambulatory Visit | Attending: Hematology and Oncology | Admitting: Hematology and Oncology

## 2018-08-01 DIAGNOSIS — C539 Malignant neoplasm of cervix uteri, unspecified: Secondary | ICD-10-CM

## 2018-08-01 DIAGNOSIS — C538 Malignant neoplasm of overlapping sites of cervix uteri: Secondary | ICD-10-CM | POA: Diagnosis not present

## 2018-08-01 LAB — COMPREHENSIVE METABOLIC PANEL
ALT: 13 U/L (ref 0–44)
AST: 12 U/L — ABNORMAL LOW (ref 15–41)
Albumin: 3.5 g/dL (ref 3.5–5.0)
Alkaline Phosphatase: 55 U/L (ref 38–126)
Anion gap: 12 (ref 5–15)
BUN: 14 mg/dL (ref 8–23)
CHLORIDE: 103 mmol/L (ref 98–111)
CO2: 28 mmol/L (ref 22–32)
CREATININE: 0.75 mg/dL (ref 0.44–1.00)
Calcium: 9.2 mg/dL (ref 8.9–10.3)
Glucose, Bld: 115 mg/dL — ABNORMAL HIGH (ref 70–99)
Potassium: 3.2 mmol/L — ABNORMAL LOW (ref 3.5–5.1)
Sodium: 143 mmol/L (ref 135–145)
Total Bilirubin: 0.4 mg/dL (ref 0.3–1.2)
Total Protein: 6.6 g/dL (ref 6.5–8.1)

## 2018-08-01 LAB — CBC WITH DIFFERENTIAL/PLATELET
Abs Immature Granulocytes: 0.06 10*3/uL (ref 0.00–0.07)
Basophils Absolute: 0 10*3/uL (ref 0.0–0.1)
Basophils Relative: 0 %
EOS PCT: 0 %
Eosinophils Absolute: 0 10*3/uL (ref 0.0–0.5)
HCT: 32.3 % — ABNORMAL LOW (ref 36.0–46.0)
HEMOGLOBIN: 10.5 g/dL — AB (ref 12.0–15.0)
IMMATURE GRANULOCYTES: 2 %
LYMPHS PCT: 5 %
Lymphs Abs: 0.2 10*3/uL — ABNORMAL LOW (ref 0.7–4.0)
MCH: 30.2 pg (ref 26.0–34.0)
MCHC: 32.5 g/dL (ref 30.0–36.0)
MCV: 92.8 fL (ref 80.0–100.0)
Monocytes Absolute: 0.4 10*3/uL (ref 0.1–1.0)
Monocytes Relative: 11 %
Neutro Abs: 3.2 10*3/uL (ref 1.7–7.7)
Neutrophils Relative %: 82 %
PLATELETS: 174 10*3/uL (ref 150–400)
RBC: 3.48 MIL/uL — AB (ref 3.87–5.11)
RDW: 23.4 % — ABNORMAL HIGH (ref 11.5–15.5)
WBC: 3.9 10*3/uL — AB (ref 4.0–10.5)
nRBC: 0 % (ref 0.0–0.2)

## 2018-08-01 LAB — MAGNESIUM: Magnesium: 1.7 mg/dL (ref 1.7–2.4)

## 2018-08-01 MED ORDER — SODIUM CHLORIDE 0.9 % IV SOLN
INTRAVENOUS | Status: AC
  Administered 2018-08-01: 10:00:00 via INTRAVENOUS

## 2018-08-01 MED ORDER — HEPARIN SOD (PORK) LOCK FLUSH 100 UNIT/ML IV SOLN
500.0000 [IU] | INTRAVENOUS | Status: AC | PRN
  Administered 2018-08-01: 500 [IU]
  Filled 2018-08-01: qty 5

## 2018-08-01 MED ORDER — SODIUM CHLORIDE 0.9% FLUSH
10.0000 mL | INTRAVENOUS | Status: AC | PRN
  Administered 2018-08-01: 10 mL

## 2018-08-01 NOTE — Discharge Instructions (Signed)
Dehydration, Adult Dehydration is when there is not enough fluid or water in your body. This happens when you lose more fluids than you take in. Dehydration can range from mild to very bad. It should be treated right away to keep it from getting very bad. Symptoms of mild dehydration may include:  Thirst.  Dry lips.  Slightly dry mouth.  Dry, warm skin.  Dizziness. Symptoms of moderate dehydration may include:  Very dry mouth.  Muscle cramps.  Dark pee (urine). Pee may be the color of tea.  Your body making less pee.  Your eyes making fewer tears.  Heartbeat that is uneven or faster than normal (palpitations).  Headache.  Light-headedness, especially when you stand up from sitting.  Fainting (syncope). Symptoms of very bad dehydration may include:  Changes in skin, such as: ? Cold and clammy skin. ? Blotchy (mottled) or pale skin. ? Skin that does not quickly return to normal after being lightly pinched and let go (poor skin turgor).  Changes in body fluids, such as: ? Feeling very thirsty. ? Your eyes making fewer tears. ? Not sweating when body temperature is high, such as in hot weather. ? Your body making very little pee.  Changes in vital signs, such as: ? Weak pulse. ? Pulse that is more than 100 beats a minute when you are sitting still. ? Fast breathing. ? Low blood pressure.  Other changes, such as: ? Sunken eyes. ? Cold hands and feet. ? Confusion. ? Lack of energy (lethargy). ? Trouble waking up from sleep. ? Short-term weight loss. ? Unconsciousness. Follow these instructions at home:  If told by your doctor, drink an ORS: ? Make an ORS by using instructions on the package. ? Start by drinking small amounts, about  cup (120 mL) every 5-10 minutes. ? Slowly drink more until you have had the amount that your doctor said to have.  Drink enough clear fluid to keep your pee clear or pale yellow. If you were told to drink an ORS, finish the ORS  first, then start slowly drinking clear fluids. Drink fluids such as: ? Water. Do not drink only water by itself. Doing that can make the salt (sodium) level in your body get too low (hyponatremia). ? Ice chips. ? Fruit juice that you have added water to (diluted). ? Low-calorie sports drinks.  Avoid: ? Alcohol. ? Drinks that have a lot of sugar. These include high-calorie sports drinks, fruit juice that does not have water added, and soda. ? Caffeine. ? Foods that are greasy or have a lot of fat or sugar.  Take over-the-counter and prescription medicines only as told by your doctor.  Do not take salt tablets. Doing that can make the salt level in your body get too high (hypernatremia).  Eat foods that have minerals (electrolytes). Examples include bananas, oranges, potatoes, tomatoes, and spinach.  Keep all follow-up visits as told by your doctor. This is important. Contact a doctor if:  You have belly (abdominal) pain that: ? Gets worse. ? Stays in one area (localizes).  You have a rash.  You have a stiff neck.  You get angry or annoyed more easily than normal (irritability).  You are more sleepy than normal.  You have a harder time waking up than normal.  You feel: ? Weak. ? Dizzy. ? Very thirsty.  You have peed (urinated) only a small amount of very dark pee during 6-8 hours. Get help right away if:  You have symptoms of   very bad dehydration.  You cannot drink fluids without throwing up (vomiting).  Your symptoms get worse with treatment.  You have a fever.  You have a very bad headache.  You are throwing up or having watery poop (diarrhea) and it: ? Gets worse. ? Does not go away.  You have blood or something green (bile) in your throw-up.  You have blood in your poop (stool). This may cause poop to look black and tarry.  You have not peed in 6-8 hours.  You pass out (faint).  Your heart rate when you are sitting still is more than 100 beats a  minute.  You have trouble breathing. This information is not intended to replace advice given to you by your health care provider. Make sure you discuss any questions you have with your health care provider. Document Released: 06/27/2009 Document Revised: 03/20/2016 Document Reviewed: 10/25/2015 Elsevier Interactive Patient Education  2018 Elsevier Inc.  

## 2018-08-01 NOTE — Progress Notes (Signed)
PATIENT CARE CENTER NOTE  Diagnosis: Dehydration   Provider: Dr. Alvy Bimler   Procedure: 1 liter fluid bolus   Note: Patient received 1 liter bolus of 0.9% Sodium Chloride over 2 hours. Tolerated bolus well. Vital signs stable. Discharge instructions given to patient and caregiver. Patient alert, oriented and ambulatory at discharge.

## 2018-08-02 ENCOUNTER — Other Ambulatory Visit: Payer: Self-pay

## 2018-08-02 ENCOUNTER — Ambulatory Visit: Payer: Self-pay

## 2018-08-02 ENCOUNTER — Inpatient Hospital Stay: Payer: Medicaid Other | Admitting: Nutrition

## 2018-08-03 ENCOUNTER — Ambulatory Visit (HOSPITAL_COMMUNITY)
Admission: RE | Admit: 2018-08-03 | Discharge: 2018-08-03 | Disposition: A | Discharge status: Home / Self Care | Payer: Medicaid Other | Source: Ambulatory Visit | Attending: Hematology and Oncology | Admitting: Hematology and Oncology

## 2018-08-03 ENCOUNTER — Other Ambulatory Visit: Payer: Self-pay | Admitting: Oncology

## 2018-08-03 DIAGNOSIS — C539 Malignant neoplasm of cervix uteri, unspecified: Secondary | ICD-10-CM

## 2018-08-03 DIAGNOSIS — D500 Iron deficiency anemia secondary to blood loss (chronic): Secondary | ICD-10-CM

## 2018-08-03 MED ORDER — SODIUM CHLORIDE 0.9 % IV SOLN: INTRAVENOUS | Status: AC

## 2018-08-03 MED ORDER — SODIUM CHLORIDE 0.9% FLUSH
10.0000 mL | INTRAVENOUS | Status: AC | PRN
  Administered 2018-08-03: 10 mL

## 2018-08-03 MED ORDER — SODIUM CHLORIDE 0.9 % IV SOLN
INTRAVENOUS | Status: DC
  Administered 2018-08-03: 09:00:00 via INTRAVENOUS

## 2018-08-03 MED ORDER — HEPARIN SOD (PORK) LOCK FLUSH 100 UNIT/ML IV SOLN
500.0000 [IU] | Freq: Every day | INTRAVENOUS | Status: AC | PRN
  Administered 2018-08-03: 500 [IU]
  Filled 2018-08-03: qty 5

## 2018-08-03 NOTE — Progress Notes (Signed)
Patient Erin Kent  Provider: Norberto Sorenson  Procedure: I liter  NS bolus  Note:  Patient received 1 liter of 0.9% Sodium Chloride over the course of 2 hours. Tolerated the procedure, no adverse reaction. Vital signs stable.Porta cath unremarkable. Discharge instructions provided to patient's caregiver. Patient alert ,oriented and ambulatory at discharge.

## 2018-08-03 NOTE — Discharge Instructions (Signed)

## 2018-08-05 ENCOUNTER — Encounter (HOSPITAL_COMMUNITY): Payer: Self-pay

## 2018-08-05 ENCOUNTER — Encounter: Payer: Self-pay | Admitting: General Practice

## 2018-08-05 ENCOUNTER — Inpatient Hospital Stay: Payer: Medicaid Other

## 2018-08-05 VITALS — BP 110/70 | HR 56 | Temp 98.0°F | Resp 18

## 2018-08-05 DIAGNOSIS — C538 Malignant neoplasm of overlapping sites of cervix uteri: Secondary | ICD-10-CM | POA: Diagnosis not present

## 2018-08-05 DIAGNOSIS — C539 Malignant neoplasm of cervix uteri, unspecified: Secondary | ICD-10-CM

## 2018-08-05 MED ORDER — SODIUM CHLORIDE 0.9% FLUSH
10.0000 mL | Freq: Once | INTRAVENOUS | Status: AC
  Administered 2018-08-05: 10 mL
  Filled 2018-08-05: qty 10

## 2018-08-05 MED ORDER — SODIUM CHLORIDE 0.9 % IV SOLN
Freq: Once | INTRAVENOUS | Status: AC
  Administered 2018-08-05: 13:00:00 via INTRAVENOUS
  Filled 2018-08-05: qty 250

## 2018-08-05 MED ORDER — HEPARIN SOD (PORK) LOCK FLUSH 100 UNIT/ML IV SOLN
500.0000 [IU] | Freq: Once | INTRAVENOUS | Status: AC
  Administered 2018-08-05: 500 [IU]
  Filled 2018-08-05: qty 5

## 2018-08-05 NOTE — Progress Notes (Signed)
[facility] Note  Followed up with Erin Kent and caregiver Erin Kent per referral from Erin Kent in infusion. They are experiencing some communication distress in their housemate/caregiving relationship as Erin Kent tries to support Erin Kent (nutrition/meal prep, transportation/accompaniment to appts, etc) and Erin Kent tries to have some autonomy while being an extended guest/housemate in Erin Kent home. Erin Kent tends to speak up more when she is on her own.   Provided empathic listening to both Erin Kent and Erin Kent, seeking to help each feel heard and to help them unpack their statements and desires in order to communicate more clearly and identify common goals.  One particular struggle for them is how to manage fluids. Erin Kent states that she is exhausted from so many months' near-daily trips to [facility], wishing that Erin Kent would manage fluids by mouth. Erin Kent states that she is having trouble with controlling her bladder when she drinks a lot of water; Erin Kent also prefers room-temperature beverages. Per Erin Kent, they are already using mattress and couch protectors, have Depends available as well, and sometimes set a reminder timer to encourage a preemptive bathroom trip before urgency.  Will continue to follow for support, but please also page if needs arise or circumstances change. Thank you.   [place], [place], [facility] Pager [phone] Voicemail [phone]

## 2018-08-05 NOTE — Patient Instructions (Signed)

## 2018-08-06 NOTE — Progress Notes (Signed)
[place]  [phone] [phone]  Clinic Follow up Note   Patient Care Team: Patient, [name] as PCP - General (General Practice) 08/08/2018  Chief complaint: F/u on cervical cancer  SUMMARY OF ONCOLOGIC HISTORY: Oncology History   Cancer Staging Malignant neoplasm of cervix ([place]) Staging form: Cervix Uteri, AJCC 8th Edition - Clinical: Stage IIIB (cT3b, cN1, cM0) - Signed by [name], MD on 05/04/2018       Malignant neoplasm of cervix ([place])   03/13/2018 Imaging    US pelvis There are mobile echogenic foci within the endometrial cavity suggestive of gas. This is nonspecific in etiology however may be secondary to endometritis. Correlate for history of recent instrumentation. This obscures visualization of the endometrial tissue and therefore a follow-up pelvic ultrasound in 2-4 weeks is recommended after resolution of the acute symptomatology if symptoms persist to further evaluate the endometrium.     03/13/2018 Initial Diagnosis    She initially presented with PMB    04/12/2018 Pathology Results    Cervix, biopsy, mass - INVASIVE SQUAMOUS CELL CARCINOMA - SEE COMMENT    04/12/2018 Surgery    PREOPERATIVE DIAGNOSIS:  Postmenopausal vaginal bleeding. POSTOPERATIVE DIAGNOSIS: The same PROCEDURE: Exam under anesthesia, pap smear, cervical mass biopsy SURGEON:  Dr. [name]  INDICATIONS: 67 y.o. yo G0P0000 with PMB here for exam under anesthesia.Risks of surgery were discussed with the patient including but not limited to: bleeding which may require transfusion; infection which may require antibiotics; injury to uterus or surrounding organs; need for additional procedures including laparotomy or laparoscopy; and other postoperative/anesthesia complications. Written informed consent was obtained.    FINDINGS:  An 8-week size midline uterus.  No adnexal mass palpable on exam. Normal cervix not visualized. Friable mass seen in  involving the vagina at the level of the cervix obliterating the anterior and posterior fornix.  ANESTHESIA:   General INTRAVENOUS FLUIDS:  300 ml of LR ESTIMATED BLOOD LOSS: 20 ml. SPECIMENS: pap smear, cervical mass biopsy COMPLICATIONS:  None immediate.     04/25/2018 PET scan    1. Large cervical mass may invade into the myometrium in down into the vagina, maximum SUV 21.3. There is a malignant left external iliac node measuring 1.4 cm in short axis with maximum SUV 14.1. 2. Accentuated activity in the cecum and ascending colon is likely physiologic given that it has no CT correlate. Correlation with the patient's colon cancer screening history is recommended. If screening is not up-to-date, appropriate screening should be considered. 3.  Aortic Atherosclerosis (ICD10-I70.0).     04/28/2018 Surgery    Pre-operative Diagnosis:  At least Stage 2B SCCa Cervical Cancer  Post-operative Diagnosis: At least clinical Stage 3A SCCa Cervical Cancer  Operation:  Exam under anesthesia due to intolerance for vaginal exam in office Cystoscopy due to concern for extension to posterior bladder (from anterior vaginal wall lesion)  Operative Findings:   1. Parametrial involvement on right  2. Right sided subvaginal tumor burden ~3cm. This approximates the right ureteral insertion into the bladder based on my exam during cystoscopy. 3. Extension of disease down upper half of vagina on lateral and posterior walls. 4. Extension of disease down to lower third of anterior vagina (versus skip lesion) thus making her Stage 3A 5. Cystoscopy revealed patent bilateral ureteral orifices and no bladder mucosa invasion or areas of concern. 6. Rectal exam grossly no disease.     05/04/2018 Cancer Staging    Staging form: Cervix Uteri, AJCC 8th  Edition - Clinical: Stage IIIB (cT3b, cN1, cM0) - Signed by [name], MD on 05/04/2018    05/12/2018 Procedure    Successful 8 French right internal jugular  vein power port placement with its tip at the SVC/RA junction    05/13/2018 -  Chemotherapy    The patient had weekly cisplatin given with concurrent radiation     06/03/2018 Adverse Reaction    She missed her chemo due to severe depression and was sent to the ER for suicidal ideation.  She was seen by psychiatrist.    07/27/2018 -  Radiation Therapy    Brachytherapy for her cervical cancer by Dr. Sondra Come     CURRENT THERAPY Surveillance   INTERVAL HISTORY: Erin Kent is a 67 y.o. female who is here for follow-up. I see her today in Dr. Calton Dach absence. She completed 6 chemo cycles on 06/24/2018, and she is currently receiving IV hydration and following up with Dr. Sondra Come for [place] therapy. Today, she is here with her caregiver. She is doing well. Her caregiver says that she is not drinking enough fluids, neither is she eat well. She receives IVF because she is not drinking, and she says that she is not drinking because she will have to pee. Her caregiver says that she fixes a timer for her to remind her to pee, but the patient doesn't want a timer and therefore doesn't drink. Her caregiver says that her last radiation was delays due to low potassium, and she is trying to get her to eat potassium containing food, but the patient declines and doesn't cooperate. Her caregiver says that the pt feels depressed when she is dehydrated and is not as active.  Pertinent positives and negatives of review of systems are listed and detailed within the above HPI.   REVIEW OF SYSTEMS:   Constitutional: Denies fevers, chills or abnormal weight loss (+) weight loss (+) low oral intake Eyes: Denies blurriness of vision Ears, nose, mouth, throat, and face: Denies mucositis or sore throat Respiratory: Denies cough, dyspnea or wheezes Cardiovascular: Denies palpitation, chest discomfort or lower extremity swelling Gastrointestinal:  Denies nausea, heartburn or change in bowel habits Skin: Denies abnormal  skin rashes Lymphatics: Denies new lymphadenopathy or easy bruising Neurological:Denies numbness, tingling or new weaknesses Behavioral/Psych: Mood is stable, no new changes  All other systems were reviewed with the patient and are negative.  MEDICAL HISTORY:  Past Medical History:  Diagnosis Date  . Aortic atherosclerosis ([place])   . Diarrhea    chronic secondary to radiation  . History of cancer chemotherapy 05-13-2018 to 06-24-2018  . History of external beam radiation therapy    completed 06-24-2018  cervical cancer---  followed with   . Hypokalemia   . Hypomagnesemia   . Iron deficiency anemia   . Malignant neoplasm cervix [facility]) oncologist-- dr Clemon Chambers   dx 04-12-2018---  invasive SCC of cervix,  Stage IIIB1, (cT3b,cN1,cM0)-- completed chemotherapy and external beam radiation on 10-11-219--  started high dose radiation 06-22-2018 for 5 treatments  . MDD (major depressive disorder)    w/ psychosis hallucination episode 06-10-2018  . Osteopenia   . Ovarian cyst    Left  . Pancytopenia, acquired ([place])   . Poor appetite   . Port-A-Cath in place     SURGICAL HISTORY: Past Surgical History:  Procedure Laterality Date  . CYSTOSCOPY N/A 04/28/2018   Procedure: CYSTOSCOPY;  Surgeon: Isabel Caprice, MD;  Location: [facility];  Service: Gynecology;  Laterality: N/A;  .  EUA/ PAPSMEAR/  CERVICAL MASS BIOPSY  04-12-2018   dr peggy constant @[facility]   . IR IMAGING GUIDED PORT INSERTION  05/12/2018  . TANDEM RING INSERTION N/A 06/27/2018   Procedure: TANDEM RING INSERTION;  Surgeon: Gery Pray, MD;  Location: [facility];  Service: Urology;  Laterality: N/A;  . TANDEM RING INSERTION N/A 07/04/2018   Procedure: TANDEM RING INSERTION;  Surgeon: Gery Pray, MD;  [facility];  Service: Urology;  Laterality: N/A;  . TANDEM RING INSERTION N/A 07/14/2018   Procedure: TANDEM RING INSERTION;  Surgeon: Gery Pray, MD;  Location: [facility];  Service: Urology;  Laterality: N/A;  . TANDEM RING INSERTION N/A 07/18/2018   Procedure: TANDEM CYLINDER INSERTION;  Surgeon: Gery Pray, MD;  Location: [facility];  Service: Urology;  Laterality: N/A;  . TANDEM RING INSERTION N/A 07/27/2018   Procedure: TANDEM RING INSERTION;  Surgeon: Gery Pray, MD;  Location: [facility];  Service: Urology;  Laterality: N/A;  . WISDOM TOOTH EXTRACTION      I have reviewed the social history and family history with the patient and they are unchanged from previous note.  ALLERGIES:  has No Known Allergies.  MEDICATIONS:  Current Outpatient Medications  Medication Sig Dispense Refill  . dexamethasone (DECADRON) 4 MG tablet Take 1 tablet (4 mg total) by mouth daily. 30 tablet 0  . loperamide (IMODIUM A-D) 2 MG tablet Take 2 mg by mouth 2 (two) times daily.    . Psyllium (METAMUCIL) 28.3 % POWD Take by mouth 2 (two) times daily.     No current facility-administered medications for this visit.    Facility-Administered Medications Ordered in Other Visits  Medication Dose Route Frequency Provider Last Rate Last Dose  . sodium chloride 0.9 % 1,000 mL with potassium chloride 20 mEq infusion   Intravenous Continuous [name], MD   Stopped at 06/09/18 1406    PHYSICAL EXAMINATION: ECOG PERFORMANCE STATUS: 2 - Symptomatic, <50% confined to bed  Vitals:   08/08/18 0824  BP: 127/71  Pulse: 64  Resp: 17  Temp: 97.8 F (36.6 C)  SpO2: 100%   Filed Weights   08/08/18 0824  Weight: 77 lb 11.2 oz (35.2 kg)    GENERAL:alert, no distress and comfortable (+) thin SKIN: skin color, texture, turgor are normal, no rashes or significant lesions EYES: normal, Conjunctiva are pink and non-injected, sclera clear OROPHARYNX:no exudate, no erythema and lips, buccal mucosa, and tongue normal (+) dry mouth NECK: supple, thyroid normal size, non-tender, without nodularity LYMPH:  no palpable  lymphadenopathy in the cervical, axillary or inguinal LUNGS: clear to auscultation and percussion with normal breathing effort HEART: regular rate & rhythm and no murmurs and no lower extremity edema ABDOMEN:abdomen soft, non-tender and normal bowel sounds Musculoskeletal:no cyanosis of digits and no clubbing  NEURO: alert & oriented x 3 with fluent speech, no focal motor/sensory deficits PSYCH: (+) appears distracted (+) quite   LABORATORY DATA:  I have reviewed the data as listed CBC Latest Ref Rng & Units 08/08/2018 08/01/2018 07/27/2018  WBC 4.0 - 10.5 K/uL 4.9 3.9(L) -  Hemoglobin 12.0 - 15.0 g/dL 10.4(L) 10.5(L) 8.2(L)  Hematocrit 36.0 - 46.0 % 31.6(L) 32.3(L) 24.0(L)  Platelets 150 - 400 K/uL 179 174 -     CMP Latest Ref Rng & Units 08/08/2018 08/01/2018 07/27/2018  Glucose 70 - 99 mg/dL 106(H) 115(H) 83  BUN 8 - 23 mg/dL 19 14 -  Creatinine 0.44 - 1.00  mg/dL 0.74 0.75 -  Sodium 135 - 145 mmol/L 143 143 141  Potassium 3.5 - 5.1 mmol/L 3.6 3.2(L) 3.1(L)  Chloride 98 - 111 mmol/L 106 103 -  CO2 22 - 32 mmol/L 28 28 -  Calcium 8.9 - 10.3 mg/dL 8.8(L) 9.2 -  Total Protein 6.5 - 8.1 g/dL 5.8(L) 6.6 -  Total Bilirubin 0.3 - 1.2 mg/dL 0.4 0.4 -  Alkaline Phos 38 - 126 U/L 46 55 -  AST 15 - 41 U/L 11(L) 12(L) -  ALT 0 - 44 U/L 10 13 -    RADIOGRAPHIC STUDIES: I have personally reviewed the radiological images as listed and agreed with the findings in the report. No results found.   ASSESSMENT & PLAN:  Erin Kent is a 67 y.o. female with history of  1. Malignant neoplasm of the cervix, stage IIIB -I have reviewed her medical records.  She was diagnosed in July 2019.  She underwent surgical resection, adjuvant chemotherapy and radiation with brachytherapy.  -She had her last chemo dose on 06/24/2018. She is still on IV hydration 3 times a week. Per her caregiver, she refuses to drink, and only eats small meal.  When she is dehydrated, her depression gets worse. --Labs  reviewed, CBC showed Hg 10.4. CMP showed BG 106 Ca 8.8 Total Protein 5.8 Albumin 3.4. Mg 1.9. Reviewed with pt and her care giver  -Home care service has been referred, but the caregiver is not able to use the port for her IV fluids at home.  I will arrange IV fluids 3 times a week for the next months -She will follow-up with Dr. Alvy Bimler in 2 weeks -Plan to repeat restaging scan at the end of February.  2.  Anemia secondary to chemotherapy -Labs reviewed, CBC showed WBC 4.9K Hg 10.4 RBC 3.34 PLT 179K. -Overall mild, no need blood transfusion.  Continue to monitoring  3. Depression and ? Cognitive dysfunction  -Patient has history of depression, was evaluated in the ED for suicida idea in September 2019 -She was seen by psychiatry in the hospital.  Per her caregiver, the medicine she was given did not help her and she could not tolerate.  Her caregiver requests a different psychologist -I will refer her to behavioral science.  We will ask our social worker Lelon Frohlich to arrange   4. Cachexia and dehydration -She is losing weight and refusing to eat and drink. Her caregiver tries to help with her oral intake, but the patient is not cooperative. -Her caregiver states that the pt. Becomes depressed when she is dehydrated. -f/u with dietician, I again encouraged her to take a nutritional supplement -Arrange IV fluids 3 times a week for her  Plan  -Referral to [facility], will ask SW Lelon Frohlich to help setting it up  -Lab, flush and IVF every MWF for the next month -f/u with Dr. Alvy Bimler in 2 weeks    No problem-specific Assessment & Plan notes found for this encounter.   No orders of the defined types were placed in this encounter.  All questions were answered. The patient knows to call the clinic with any problems, questions or concerns. No barriers to learning was detected. I spent 25 minutes counseling the patient face to face. The total time spent in the appointment was 30 minutes and more than 50% was on  counseling and review of test results  I, Noor Dweik am acting as scribe for Dr. Truitt Merle.  I have reviewed the above documentation for accuracy and  completeness, and I agree with the above.      Truitt Merle, MD 08/08/2018

## 2018-08-08 ENCOUNTER — Inpatient Hospital Stay: Payer: Medicaid Other

## 2018-08-08 ENCOUNTER — Telehealth: Payer: Self-pay

## 2018-08-08 ENCOUNTER — Encounter: Payer: Self-pay | Admitting: Hematology

## 2018-08-08 ENCOUNTER — Inpatient Hospital Stay (HOSPITAL_BASED_OUTPATIENT_CLINIC_OR_DEPARTMENT_OTHER): Payer: Medicaid Other | Admitting: Hematology

## 2018-08-08 VITALS — BP 127/71 | HR 64 | Temp 97.8°F | Resp 17 | Ht 62.0 in | Wt 77.7 lb

## 2018-08-08 DIAGNOSIS — C538 Malignant neoplasm of overlapping sites of cervix uteri: Secondary | ICD-10-CM | POA: Diagnosis not present

## 2018-08-08 DIAGNOSIS — R64 Cachexia: Secondary | ICD-10-CM | POA: Diagnosis not present

## 2018-08-08 DIAGNOSIS — C539 Malignant neoplasm of cervix uteri, unspecified: Secondary | ICD-10-CM

## 2018-08-08 DIAGNOSIS — D6481 Anemia due to antineoplastic chemotherapy: Secondary | ICD-10-CM

## 2018-08-08 DIAGNOSIS — R630 Anorexia: Secondary | ICD-10-CM

## 2018-08-08 DIAGNOSIS — D61818 Other pancytopenia: Secondary | ICD-10-CM

## 2018-08-08 DIAGNOSIS — M8580 Other specified disorders of bone density and structure, unspecified site: Secondary | ICD-10-CM

## 2018-08-08 DIAGNOSIS — E860 Dehydration: Secondary | ICD-10-CM

## 2018-08-08 DIAGNOSIS — N83209 Unspecified ovarian cyst, unspecified side: Secondary | ICD-10-CM

## 2018-08-08 DIAGNOSIS — Z79899 Other long term (current) drug therapy: Secondary | ICD-10-CM

## 2018-08-08 DIAGNOSIS — F329 Major depressive disorder, single episode, unspecified: Secondary | ICD-10-CM

## 2018-08-08 DIAGNOSIS — E876 Hypokalemia: Secondary | ICD-10-CM

## 2018-08-08 DIAGNOSIS — T451X5A Adverse effect of antineoplastic and immunosuppressive drugs, initial encounter: Secondary | ICD-10-CM

## 2018-08-08 LAB — CBC WITH DIFFERENTIAL/PLATELET
Abs Immature Granulocytes: 0.25 10*3/uL — ABNORMAL HIGH (ref 0.00–0.07)
Basophils Absolute: 0 10*3/uL (ref 0.0–0.1)
Basophils Relative: 1 %
Eosinophils Absolute: 0 10*3/uL (ref 0.0–0.5)
Eosinophils Relative: 0 %
HCT: 31.6 % — ABNORMAL LOW (ref 36.0–46.0)
HEMOGLOBIN: 10.4 g/dL — AB (ref 12.0–15.0)
Immature Granulocytes: 5 %
LYMPHS PCT: 4 %
Lymphs Abs: 0.2 10*3/uL — ABNORMAL LOW (ref 0.7–4.0)
MCH: 31.1 pg (ref 26.0–34.0)
MCHC: 32.9 g/dL (ref 30.0–36.0)
MCV: 94.6 fL (ref 80.0–100.0)
Monocytes Absolute: 0.6 10*3/uL (ref 0.1–1.0)
Monocytes Relative: 13 %
Neutro Abs: 3.7 10*3/uL (ref 1.7–7.7)
Neutrophils Relative %: 77 %
Platelets: 179 10*3/uL (ref 150–400)
RBC: 3.34 MIL/uL — AB (ref 3.87–5.11)
RDW: 21.9 % — ABNORMAL HIGH (ref 11.5–15.5)
WBC: 4.9 10*3/uL (ref 4.0–10.5)
nRBC: 0 % (ref 0.0–0.2)

## 2018-08-08 LAB — COMPREHENSIVE METABOLIC PANEL
ALT: 10 U/L (ref 0–44)
AST: 11 U/L — AB (ref 15–41)
Albumin: 3.4 g/dL — ABNORMAL LOW (ref 3.5–5.0)
Alkaline Phosphatase: 46 U/L (ref 38–126)
Anion gap: 9 (ref 5–15)
BILIRUBIN TOTAL: 0.4 mg/dL (ref 0.3–1.2)
BUN: 19 mg/dL (ref 8–23)
CHLORIDE: 106 mmol/L (ref 98–111)
CO2: 28 mmol/L (ref 22–32)
CREATININE: 0.74 mg/dL (ref 0.44–1.00)
Calcium: 8.8 mg/dL — ABNORMAL LOW (ref 8.9–10.3)
GFR calc Af Amer: >60 mL/min (ref >60)
Glucose, Bld: 106 mg/dL — ABNORMAL HIGH (ref 70–99)
Potassium: 3.6 mmol/L (ref 3.5–5.1)
SODIUM: 143 mmol/L (ref 135–145)
Total Protein: 5.8 g/dL — ABNORMAL LOW (ref 6.5–8.1)

## 2018-08-08 LAB — MAGNESIUM: MAGNESIUM: 1.9 mg/dL (ref 1.7–2.4)

## 2018-08-08 MED ORDER — SODIUM CHLORIDE 0.9 % IV SOLN
Freq: Once | INTRAVENOUS | Status: AC
  Administered 2018-08-08: 09:00:00 via INTRAVENOUS
  Filled 2018-08-08: qty 250

## 2018-08-08 MED ORDER — HEPARIN SOD (PORK) LOCK FLUSH 100 UNIT/ML IV SOLN
500.0000 [IU] | Freq: Once | INTRAVENOUS | Status: AC
  Administered 2018-08-08: 500 [IU]
  Filled 2018-08-08: qty 5

## 2018-08-08 NOTE — Patient Instructions (Signed)
Dehydration, Adult Dehydration is when there is not enough fluid or water in your body. This happens when you lose more fluids than you take in. Dehydration can range from mild to very bad. It should be treated right away to keep it from getting very bad. Symptoms of mild dehydration may include:  Thirst.  Dry lips.  Slightly dry mouth.  Dry, warm skin.  Dizziness. Symptoms of moderate dehydration may include:  Very dry mouth.  Muscle cramps.  Dark pee (urine). Pee may be the color of tea.  Your body making less pee.  Your eyes making fewer tears.  Heartbeat that is uneven or faster than normal (palpitations).  Headache.  Light-headedness, especially when you stand up from sitting.  Fainting (syncope). Symptoms of very bad dehydration may include:  Changes in skin, such as: ? Cold and clammy skin. ? Blotchy (mottled) or pale skin. ? Skin that does not quickly return to normal after being lightly pinched and let go (poor skin turgor).  Changes in body fluids, such as: ? Feeling very thirsty. ? Your eyes making fewer tears. ? Not sweating when body temperature is high, such as in hot weather. ? Your body making very little pee.  Changes in vital signs, such as: ? Weak pulse. ? Pulse that is more than 100 beats a minute when you are sitting still. ? Fast breathing. ? Low blood pressure.  Other changes, such as: ? Sunken eyes. ? Cold hands and feet. ? Confusion. ? Lack of energy (lethargy). ? Trouble waking up from sleep. ? Short-term weight loss. ? Unconsciousness. Follow these instructions at home:  If told by your doctor, drink an ORS: ? Make an ORS by using instructions on the package. ? Start by drinking small amounts, about  cup (120 mL) every 5-10 minutes. ? Slowly drink more until you have had the amount that your doctor said to have.  Drink enough clear fluid to keep your pee clear or pale yellow. If you were told to drink an ORS, finish the ORS  first, then start slowly drinking clear fluids. Drink fluids such as: ? Water. Do not drink only water by itself. Doing that can make the salt (sodium) level in your body get too low (hyponatremia). ? Ice chips. ? Fruit juice that you have added water to (diluted). ? Low-calorie sports drinks.  Avoid: ? Alcohol. ? Drinks that have a lot of sugar. These include high-calorie sports drinks, fruit juice that does not have water added, and soda. ? Caffeine. ? Foods that are greasy or have a lot of fat or sugar.  Take over-the-counter and prescription medicines only as told by your doctor.  Do not take salt tablets. Doing that can make the salt level in your body get too high (hypernatremia).  Eat foods that have minerals (electrolytes). Examples include bananas, oranges, potatoes, tomatoes, and spinach.  Keep all follow-up visits as told by your doctor. This is important. Contact a doctor if:  You have belly (abdominal) pain that: ? Gets worse. ? Stays in one area (localizes).  You have a rash.  You have a stiff neck.  You get angry or annoyed more easily than normal (irritability).  You are more sleepy than normal.  You have a harder time waking up than normal.  You feel: ? Weak. ? Dizzy. ? Very thirsty.  You have peed (urinated) only a small amount of very dark pee during 6-8 hours. Get help right away if:  You have symptoms of   very bad dehydration.  You cannot drink fluids without throwing up (vomiting).  Your symptoms get worse with treatment.  You have a fever.  You have a very bad headache.  You are throwing up or having watery poop (diarrhea) and it: ? Gets worse. ? Does not go away.  You have blood or something green (bile) in your throw-up.  You have blood in your poop (stool). This may cause poop to look black and tarry.  You have not peed in 6-8 hours.  You pass out (faint).  Your heart rate when you are sitting still is more than 100 beats a  minute.  You have trouble breathing. This information is not intended to replace advice given to you by your health care provider. Make sure you discuss any questions you have with your health care provider. Document Released: 06/27/2009 Document Revised: 03/20/2016 Document Reviewed: 10/25/2015 Elsevier Interactive Patient Education  2018 Elsevier Inc.  

## 2018-08-08 NOTE — Telephone Encounter (Signed)
Printed calender of upcoming appointment. Per 11/25 no los

## 2018-08-09 ENCOUNTER — Telehealth: Payer: Self-pay

## 2018-08-09 ENCOUNTER — Other Ambulatory Visit: Payer: Self-pay

## 2018-08-09 ENCOUNTER — Encounter: Payer: Self-pay | Admitting: General Practice

## 2018-08-09 ENCOUNTER — Ambulatory Visit: Payer: Self-pay

## 2018-08-09 NOTE — Progress Notes (Signed)
[facility] CSW Progress Notes  MD requests that patient be linked w outpatient behavioral health for medication evaluation.  Per record, patient was seen in ED in late September, prescribed medications and instructed to follow up w [facility] for refills.  Per caregiver, patient did not tolerate these medications well and stopped taking them.  MD is concerned about ongoing issues w depression and alterations in patterns of eating/drinking.  Earliest available appointment at [facility] is 1/27 at 10 AM w Dr Adele Schilder.  Patient can also be seen at walk in clinic at [facility] prior to that time.  Staff message sent to MD asking for consideration of need for home health RN and SW to evaluation needs/concerns in the home and assist as needed.   Edwyna Shell, LCSW Clinical Social Worker Phone:  [phone]

## 2018-08-09 NOTE — Telephone Encounter (Signed)
Per 11/25 los was Unable to reschedule NUT appointment due to no avalability for [facility]. Called and left a voice message concerning this request with patient name, MR#, and D.O.B.

## 2018-08-09 NOTE — Telephone Encounter (Signed)
Printed upcoming appointmrny. Will give to patient on 11/27. Per 11/25 los

## 2018-08-09 NOTE — Telephone Encounter (Signed)
Printed for pick up. Per 11/26 follow up

## 2018-08-09 NOTE — Progress Notes (Signed)
[facility] CSW Progress Note  Message left at patient's home number to inform her of appointment, VM left requesting call back.  Edwyna Shell, LCSW Clinical Social Worker Phone:  [phone]

## 2018-08-10 ENCOUNTER — Telehealth: Payer: Self-pay

## 2018-08-10 ENCOUNTER — Inpatient Hospital Stay: Payer: Medicaid Other

## 2018-08-10 ENCOUNTER — Encounter: Payer: Self-pay | Admitting: *Deleted

## 2018-08-10 VITALS — BP 134/69 | HR 68 | Temp 98.7°F | Resp 16

## 2018-08-10 DIAGNOSIS — C538 Malignant neoplasm of overlapping sites of cervix uteri: Secondary | ICD-10-CM | POA: Diagnosis not present

## 2018-08-10 DIAGNOSIS — C539 Malignant neoplasm of cervix uteri, unspecified: Secondary | ICD-10-CM

## 2018-08-10 MED ORDER — SODIUM CHLORIDE 0.9% FLUSH
10.0000 mL | Freq: Once | INTRAVENOUS | Status: AC
  Administered 2018-08-10: 10 mL
  Filled 2018-08-10: qty 10

## 2018-08-10 MED ORDER — SODIUM CHLORIDE 0.9 % IV SOLN
Freq: Once | INTRAVENOUS | Status: AC
  Administered 2018-08-10: 08:00:00 via INTRAVENOUS
  Filled 2018-08-10: qty 250

## 2018-08-10 MED ORDER — HEPARIN SOD (PORK) LOCK FLUSH 100 UNIT/ML IV SOLN
500.0000 [IU] | Freq: Once | INTRAVENOUS | Status: AC
  Administered 2018-08-10: 500 [IU]
  Filled 2018-08-10: qty 5

## 2018-08-10 NOTE — Patient Instructions (Signed)
Dehydration, Adult Dehydration is when there is not enough fluid or water in your body. This happens when you lose more fluids than you take in. Dehydration can range from mild to very bad. It should be treated right away to keep it from getting very bad. Symptoms of mild dehydration may include:  Thirst.  Dry lips.  Slightly dry mouth.  Dry, warm skin.  Dizziness. Symptoms of moderate dehydration may include:  Very dry mouth.  Muscle cramps.  Dark pee (urine). Pee may be the color of tea.  Your body making less pee.  Your eyes making fewer tears.  Heartbeat that is uneven or faster than normal (palpitations).  Headache.  Light-headedness, especially when you stand up from sitting.  Fainting (syncope). Symptoms of very bad dehydration may include:  Changes in skin, such as: ? Cold and clammy skin. ? Blotchy (mottled) or pale skin. ? Skin that does not quickly return to normal after being lightly pinched and let go (poor skin turgor).  Changes in body fluids, such as: ? Feeling very thirsty. ? Your eyes making fewer tears. ? Not sweating when body temperature is high, such as in hot weather. ? Your body making very little pee.  Changes in vital signs, such as: ? Weak pulse. ? Pulse that is more than 100 beats a minute when you are sitting still. ? Fast breathing. ? Low blood pressure.  Other changes, such as: ? Sunken eyes. ? Cold hands and feet. ? Confusion. ? Lack of energy (lethargy). ? Trouble waking up from sleep. ? Short-term weight loss. ? Unconsciousness. Follow these instructions at home:  If told by your doctor, drink an ORS: ? Make an ORS by using instructions on the package. ? Start by drinking small amounts, about  cup (120 mL) every 5-10 minutes. ? Slowly drink more until you have had the amount that your doctor said to have.  Drink enough clear fluid to keep your pee clear or pale yellow. If you were told to drink an ORS, finish the ORS  first, then start slowly drinking clear fluids. Drink fluids such as: ? Water. Do not drink only water by itself. Doing that can make the salt (sodium) level in your body get too low (hyponatremia). ? Ice chips. ? Fruit juice that you have added water to (diluted). ? Low-calorie sports drinks.  Avoid: ? Alcohol. ? Drinks that have a lot of sugar. These include high-calorie sports drinks, fruit juice that does not have water added, and soda. ? Caffeine. ? Foods that are greasy or have a lot of fat or sugar.  Take over-the-counter and prescription medicines only as told by your doctor.  Do not take salt tablets. Doing that can make the salt level in your body get too high (hypernatremia).  Eat foods that have minerals (electrolytes). Examples include bananas, oranges, potatoes, tomatoes, and spinach.  Keep all follow-up visits as told by your doctor. This is important. Contact a doctor if:  You have belly (abdominal) pain that: ? Gets worse. ? Stays in one area (localizes).  You have a rash.  You have a stiff neck.  You get angry or annoyed more easily than normal (irritability).  You are more sleepy than normal.  You have a harder time waking up than normal.  You feel: ? Weak. ? Dizzy. ? Very thirsty.  You have peed (urinated) only a small amount of very dark pee during 6-8 hours. Get help right away if:  You have symptoms of   very bad dehydration.  You cannot drink fluids without throwing up (vomiting).  Your symptoms get worse with treatment.  You have a fever.  You have a very bad headache.  You are throwing up or having watery poop (diarrhea) and it: ? Gets worse. ? Does not go away.  You have blood or something green (bile) in your throw-up.  You have blood in your poop (stool). This may cause poop to look black and tarry.  You have not peed in 6-8 hours.  You pass out (faint).  Your heart rate when you are sitting still is more than 100 beats a  minute.  You have trouble breathing. This information is not intended to replace advice given to you by your health care provider. Make sure you discuss any questions you have with your health care provider. Document Released: 06/27/2009 Document Revised: 03/20/2016 Document Reviewed: 10/25/2015 Elsevier Interactive Patient Education  2018 Elsevier Inc.  

## 2018-08-10 NOTE — Telephone Encounter (Signed)
Patient pick up new schedule. Per 10/27 walk in

## 2018-08-12 ENCOUNTER — Ambulatory Visit: Payer: Self-pay

## 2018-08-12 ENCOUNTER — Inpatient Hospital Stay: Payer: Medicaid Other

## 2018-08-12 VITALS — BP 124/73 | HR 70 | Temp 98.5°F | Resp 16

## 2018-08-12 DIAGNOSIS — C538 Malignant neoplasm of overlapping sites of cervix uteri: Secondary | ICD-10-CM | POA: Diagnosis not present

## 2018-08-12 DIAGNOSIS — C539 Malignant neoplasm of cervix uteri, unspecified: Secondary | ICD-10-CM

## 2018-08-12 LAB — COMPREHENSIVE METABOLIC PANEL
ALK PHOS: 67 U/L (ref 38–126)
ALT: 9 U/L (ref 0–44)
AST: 11 U/L — AB (ref 15–41)
Albumin: 3.4 g/dL — ABNORMAL LOW (ref 3.5–5.0)
Anion gap: 7 (ref 5–15)
BILIRUBIN TOTAL: 0.3 mg/dL (ref 0.3–1.2)
BUN: 19 mg/dL (ref 8–23)
CALCIUM: 8.4 mg/dL — AB (ref 8.9–10.3)
CO2: 28 mmol/L (ref 22–32)
CREATININE: 0.71 mg/dL (ref 0.44–1.00)
Chloride: 106 mmol/L (ref 98–111)
GFR calc non Af Amer: >60 mL/min (ref >60)
Glucose, Bld: 84 mg/dL (ref 70–99)
Potassium: 3.3 mmol/L — ABNORMAL LOW (ref 3.5–5.1)
Sodium: 141 mmol/L (ref 135–145)
Total Protein: 5.9 g/dL — ABNORMAL LOW (ref 6.5–8.1)

## 2018-08-12 LAB — CBC WITH DIFFERENTIAL/PLATELET
ABS IMMATURE GRANULOCYTES: 0.26 10*3/uL — AB (ref 0.00–0.07)
BASOS PCT: 0 %
Basophils Absolute: 0 10*3/uL (ref 0.0–0.1)
Eosinophils Absolute: 0 10*3/uL (ref 0.0–0.5)
Eosinophils Relative: 0 %
HEMATOCRIT: 30.3 % — AB (ref 36.0–46.0)
HEMOGLOBIN: 10 g/dL — AB (ref 12.0–15.0)
Immature Granulocytes: 5 %
LYMPHS ABS: 0.3 10*3/uL — AB (ref 0.7–4.0)
LYMPHS PCT: 5 %
MCH: 31.4 pg (ref 26.0–34.0)
MCHC: 33 g/dL (ref 30.0–36.0)
MCV: 95.3 fL (ref 80.0–100.0)
MONO ABS: 0.9 10*3/uL (ref 0.1–1.0)
MONOS PCT: 16 %
NEUTROS ABS: 4.3 10*3/uL (ref 1.7–7.7)
Neutrophils Relative %: 74 %
Platelets: 150 10*3/uL (ref 150–400)
RBC: 3.18 MIL/uL — ABNORMAL LOW (ref 3.87–5.11)
RDW: 20.4 % — ABNORMAL HIGH (ref 11.5–15.5)
WBC: 5.8 10*3/uL (ref 4.0–10.5)
nRBC: 0 % (ref 0.0–0.2)

## 2018-08-12 LAB — MAGNESIUM: MAGNESIUM: 1.7 mg/dL (ref 1.7–2.4)

## 2018-08-12 MED ORDER — SODIUM CHLORIDE 0.9% FLUSH
10.0000 mL | Freq: Once | INTRAVENOUS | Status: AC
  Administered 2018-08-12: 10 mL
  Filled 2018-08-12: qty 10

## 2018-08-12 MED ORDER — SODIUM CHLORIDE 0.9 % IV SOLN
Freq: Once | INTRAVENOUS | Status: AC
  Administered 2018-08-12: 13:00:00 via INTRAVENOUS
  Filled 2018-08-12: qty 250

## 2018-08-12 MED ORDER — HEPARIN SOD (PORK) LOCK FLUSH 100 UNIT/ML IV SOLN
500.0000 [IU] | Freq: Once | INTRAVENOUS | Status: AC
  Administered 2018-08-12: 500 [IU]
  Filled 2018-08-12: qty 5

## 2018-08-12 NOTE — Patient Instructions (Signed)

## 2018-08-12 NOTE — Patient Instructions (Addendum)
Dehydration, Adult Dehydration is when there is not enough fluid or water in your body. This happens when you lose more fluids than you take in. Dehydration can range from mild to very bad. It should be treated right away to keep it from getting very bad. Symptoms of mild dehydration may include:  Thirst.  Dry lips.  Slightly dry mouth.  Dry, warm skin.  Dizziness. Symptoms of moderate dehydration may include:  Very dry mouth.  Muscle cramps.  Dark pee (urine). Pee may be the color of tea.  Your body making less pee.  Your eyes making fewer tears.  Heartbeat that is uneven or faster than normal (palpitations).  Headache.  Light-headedness, especially when you stand up from sitting.  Fainting (syncope). Symptoms of very bad dehydration may include:  Changes in skin, such as: ? Cold and clammy skin. ? Blotchy (mottled) or pale skin. ? Skin that does not quickly return to normal after being lightly pinched and let go (poor skin turgor).  Changes in body fluids, such as: ? Feeling very thirsty. ? Your eyes making fewer tears. ? Not sweating when body temperature is high, such as in hot weather. ? Your body making very little pee.  Changes in vital signs, such as: ? Weak pulse. ? Pulse that is more than 100 beats a minute when you are sitting still. ? Fast breathing. ? Low blood pressure.  Other changes, such as: ? Sunken eyes. ? Cold hands and feet. ? Confusion. ? Lack of energy (lethargy). ? Trouble waking up from sleep. ? Short-term weight loss. ? Unconsciousness. Follow these instructions at home:  If told by your doctor, drink an ORS: ? Make an ORS by using instructions on the package. ? Start by drinking small amounts, about  cup (120 mL) every 5-10 minutes. ? Slowly drink more until you have had the amount that your doctor said to have.  Drink enough clear fluid to keep your pee clear or pale yellow. If you were told to drink an ORS, finish the ORS  first, then start slowly drinking clear fluids. Drink fluids such as: ? Water. Do not drink only water by itself. Doing that can make the salt (sodium) level in your body get too low (hyponatremia). ? Ice chips. ? Fruit juice that you have added water to (diluted). ? Low-calorie sports drinks.  Avoid: ? Alcohol. ? Drinks that have a lot of sugar. These include high-calorie sports drinks, fruit juice that does not have water added, and soda. ? Caffeine. ? Foods that are greasy or have a lot of fat or sugar.  Take over-the-counter and prescription medicines only as told by your doctor.  Do not take salt tablets. Doing that can make the salt level in your body get too high (hypernatremia).  Eat foods that have minerals (electrolytes). Examples include bananas, oranges, potatoes, tomatoes, and spinach.  Keep all follow-up visits as told by your doctor. This is important. Contact a doctor if:  You have belly (abdominal) pain that: ? Gets worse. ? Stays in one area (localizes).  You have a rash.  You have a stiff neck.  You get angry or annoyed more easily than normal (irritability).  You are more sleepy than normal.  You have a harder time waking up than normal.  You feel: ? Weak. ? Dizzy. ? Very thirsty.  You have peed (urinated) only a small amount of very dark pee during 6-8 hours. Get help right away if:  You have symptoms of   very bad dehydration.  You cannot drink fluids without throwing up (vomiting).  Your symptoms get worse with treatment.  You have a fever.  You have a very bad headache.  You are throwing up or having watery poop (diarrhea) and it: ? Gets worse. ? Does not go away.  You have blood or something green (bile) in your throw-up.  You have blood in your poop (stool). This may cause poop to look black and tarry.  You have not peed in 6-8 hours.  You pass out (faint).  Your heart rate when you are sitting still is more than 100 beats a  minute.  You have trouble breathing. This information is not intended to replace advice given to you by your health care provider. Make sure you discuss any questions you have with your health care provider. Document Released: 06/27/2009 Document Revised: 03/20/2016 Document Reviewed: 10/25/2015 Elsevier Interactive Patient Education  2018 [employer]  Dehydration, Adult Dehydration is when there is not enough fluid or water in your body. This happens when you lose more fluids than you take in. Dehydration can range from mild to very bad. It should be treated right away to keep it from getting very bad. Symptoms of mild dehydration may include:  Thirst.  Dry lips.  Slightly dry mouth.  Dry, warm skin.  Dizziness. Symptoms of moderate dehydration may include:  Very dry mouth.  Muscle cramps.  Dark pee (urine). Pee may be the color of tea.  Your body making less pee.  Your eyes making fewer tears.  Heartbeat that is uneven or faster than normal (palpitations).  Headache.  Light-headedness, especially when you stand up from sitting.  Fainting (syncope). Symptoms of very bad dehydration may include:  Changes in skin, such as: ? Cold and clammy skin. ? Blotchy (mottled) or pale skin. ? Skin that does not quickly return to normal after being lightly pinched and let go (poor skin turgor).  Changes in body fluids, such as: ? Feeling very thirsty. ? Your eyes making fewer tears. ? Not sweating when body temperature is high, such as in hot weather. ? Your body making very little pee.  Changes in vital signs, such as: ? Weak pulse. ? Pulse that is more than 100 beats a minute when you are sitting still. ? Fast breathing. ? Low blood pressure.  Other changes, such as: ? Sunken eyes. ? Cold hands and feet. ? Confusion. ? Lack of energy (lethargy). ? Trouble waking up from sleep. ? Short-term weight loss. ? Unconsciousness. Follow these instructions at  home:  If told by your doctor, drink an ORS: ? Make an ORS by using instructions on the package. ? Start by drinking small amounts, about  cup (120 mL) every 5-10 minutes. ? Slowly drink more until you have had the amount that your doctor said to have.  Drink enough clear fluid to keep your pee clear or pale yellow. If you were told to drink an ORS, finish the ORS first, then start slowly drinking clear fluids. Drink fluids such as: ? Water. Do not drink only water by itself. Doing that can make the salt (sodium) level in your body get too low (hyponatremia). ? Ice chips. ? Fruit juice that you have added water to (diluted). ? Low-calorie sports drinks.  Avoid: ? Alcohol. ? Drinks that have a lot of sugar. These include high-calorie sports drinks, fruit juice that does not have water added, and soda. ? Caffeine. ? Foods that are greasy or have   a lot of fat or sugar.  Take over-the-counter and prescription medicines only as told by your doctor.  Do not take salt tablets. Doing that can make the salt level in your body get too high (hypernatremia).  Eat foods that have minerals (electrolytes). Examples include bananas, oranges, potatoes, tomatoes, and spinach.  Keep all follow-up visits as told by your doctor. This is important. Contact a doctor if:  You have belly (abdominal) pain that: ? Gets worse. ? Stays in one area (localizes).  You have a rash.  You have a stiff neck.  You get angry or annoyed more easily than normal (irritability).  You are more sleepy than normal.  You have a harder time waking up than normal.  You feel: ? Weak. ? Dizzy. ? Very thirsty.  You have peed (urinated) only a small amount of very dark pee during 6-8 hours. Get help right away if:  You have symptoms of very bad dehydration.  You cannot drink fluids without throwing up (vomiting).  Your symptoms get worse with treatment.  You have a fever.  You have a very bad headache.  You  are throwing up or having watery poop (diarrhea) and it: ? Gets worse. ? Does not go away.  You have blood or something green (bile) in your throw-up.  You have blood in your poop (stool). This may cause poop to look black and tarry.  You have not peed in 6-8 hours.  You pass out (faint).  Your heart rate when you are sitting still is more than 100 beats a minute.  You have trouble breathing. This information is not intended to replace advice given to you by your health care provider. Make sure you discuss any questions you have with your health care provider. Document Released: 06/27/2009 Document Revised: 03/20/2016 Document Reviewed: 10/25/2015 Elsevier Interactive Patient Education  2018 [employer]  

## 2018-08-15 ENCOUNTER — Encounter: Payer: Self-pay | Admitting: Nutrition

## 2018-08-15 ENCOUNTER — Inpatient Hospital Stay: Payer: Medicaid Other | Admitting: Nutrition

## 2018-08-15 ENCOUNTER — Inpatient Hospital Stay: Payer: Medicaid Other

## 2018-08-15 ENCOUNTER — Inpatient Hospital Stay: Payer: Medicaid Other | Attending: Obstetrics

## 2018-08-15 ENCOUNTER — Other Ambulatory Visit: Payer: Self-pay

## 2018-08-15 ENCOUNTER — Encounter: Payer: Self-pay | Admitting: Oncology

## 2018-08-15 ENCOUNTER — Ambulatory Visit: Payer: Self-pay

## 2018-08-15 DIAGNOSIS — D61818 Other pancytopenia: Secondary | ICD-10-CM | POA: Insufficient documentation

## 2018-08-15 DIAGNOSIS — Z923 Personal history of irradiation: Secondary | ICD-10-CM | POA: Diagnosis not present

## 2018-08-15 DIAGNOSIS — Z79899 Other long term (current) drug therapy: Secondary | ICD-10-CM | POA: Insufficient documentation

## 2018-08-15 DIAGNOSIS — C539 Malignant neoplasm of cervix uteri, unspecified: Secondary | ICD-10-CM | POA: Insufficient documentation

## 2018-08-15 DIAGNOSIS — Z9221 Personal history of antineoplastic chemotherapy: Secondary | ICD-10-CM | POA: Insufficient documentation

## 2018-08-15 DIAGNOSIS — R64 Cachexia: Secondary | ICD-10-CM | POA: Insufficient documentation

## 2018-08-15 LAB — COMPREHENSIVE METABOLIC PANEL
ALT: 9 U/L (ref 0–44)
AST: 13 U/L — ABNORMAL LOW (ref 15–41)
Albumin: 3.4 g/dL — ABNORMAL LOW (ref 3.5–5.0)
Alkaline Phosphatase: 57 U/L (ref 38–126)
Anion gap: 8 (ref 5–15)
BUN: 19 mg/dL (ref 8–23)
CO2: 30 mmol/L (ref 22–32)
CREATININE: 0.82 mg/dL (ref 0.44–1.00)
Calcium: 8.8 mg/dL — ABNORMAL LOW (ref 8.9–10.3)
Chloride: 105 mmol/L (ref 98–111)
GFR calc non Af Amer: >60 mL/min (ref >60)
Glucose, Bld: 95 mg/dL (ref 70–99)
Potassium: 3.5 mmol/L (ref 3.5–5.1)
Sodium: 143 mmol/L (ref 135–145)
Total Bilirubin: 0.3 mg/dL (ref 0.3–1.2)
Total Protein: 6.1 g/dL — ABNORMAL LOW (ref 6.5–8.1)

## 2018-08-15 LAB — CBC WITH DIFFERENTIAL/PLATELET
Abs Immature Granulocytes: 0.21 10*3/uL — ABNORMAL HIGH (ref 0.00–0.07)
Basophils Absolute: 0 10*3/uL (ref 0.0–0.1)
Basophils Relative: 0 %
Eosinophils Absolute: 0 10*3/uL (ref 0.0–0.5)
Eosinophils Relative: 0 %
HEMATOCRIT: 30.8 % — AB (ref 36.0–46.0)
HEMOGLOBIN: 10.3 g/dL — AB (ref 12.0–15.0)
Immature Granulocytes: 4 %
LYMPHS PCT: 6 %
Lymphs Abs: 0.4 10*3/uL — ABNORMAL LOW (ref 0.7–4.0)
MCH: 31.8 pg (ref 26.0–34.0)
MCHC: 33.4 g/dL (ref 30.0–36.0)
MCV: 95.1 fL (ref 80.0–100.0)
Monocytes Absolute: 0.8 10*3/uL (ref 0.1–1.0)
Monocytes Relative: 14 %
Neutro Abs: 4.3 10*3/uL (ref 1.7–7.7)
Neutrophils Relative %: 76 %
Platelets: 148 10*3/uL — ABNORMAL LOW (ref 150–400)
RBC: 3.24 MIL/uL — ABNORMAL LOW (ref 3.87–5.11)
RDW: 20 % — ABNORMAL HIGH (ref 11.5–15.5)
WBC: 5.8 10*3/uL (ref 4.0–10.5)
nRBC: 0 % (ref 0.0–0.2)

## 2018-08-15 LAB — MAGNESIUM: Magnesium: 1.8 mg/dL (ref 1.7–2.4)

## 2018-08-15 MED ORDER — HEPARIN SOD (PORK) LOCK FLUSH 100 UNIT/ML IV SOLN
500.0000 [IU] | Freq: Once | INTRAVENOUS | Status: AC
  Administered 2018-08-15: 500 [IU]
  Filled 2018-08-15: qty 5

## 2018-08-15 MED ORDER — SODIUM CHLORIDE 0.9% FLUSH
10.0000 mL | Freq: Once | INTRAVENOUS | Status: AC
  Administered 2018-08-15: 10 mL
  Filled 2018-08-15: qty 10

## 2018-08-15 MED ORDER — SODIUM CHLORIDE 0.9 % IV SOLN
Freq: Once | INTRAVENOUS | Status: AC
  Administered 2018-08-15: 15:00:00 via INTRAVENOUS
  Filled 2018-08-15: qty 250

## 2018-08-15 NOTE — Progress Notes (Signed)
Nutrition follow-up completed with patient during IV fluids. Patient's caregiver Erin Kent was not present. Weight decreased and documented as 77.7 pounds November 25 down from 83 pounds September 26. Noted labs on November 29: Potassium 3.3, albumin 3.4, BUN 19, creatinine 0.71. Patient denies nutrition impact symptoms. Patient reports Ensure does not agree with her.  States she is eating and reports she likes [place] toast, peaches, juices, and pasta. States she does not like to drink water much.  Patient does agree to eat graham crackers and peanut butter today during IV fluids.  Nutrition diagnosis: Severe malnutrition continues.  Intervention: Encourage patient to try to eat more high-calorie, high-protein foods and provided specific examples. Reviewed foods that could contribute to increasing overall fluid intake. I explained again the importance of adequate nutrition.  Monitoring, evaluation, goals: Patient will increase calories and protein to minimize further weight loss.  Next visit:Monday, Dec 9th during infusion.  **Disclaimer: This note was dictated with voice recognition software. Similar sounding words can inadvertently be transcribed and this note may contain transcription errors which may not have been corrected upon publication of note.**

## 2018-08-16 ENCOUNTER — Other Ambulatory Visit: Payer: Self-pay

## 2018-08-16 ENCOUNTER — Ambulatory Visit: Payer: Self-pay

## 2018-08-16 NOTE — Progress Notes (Signed)
[place] Work   Clinical Social Work spoke briefly with patient and caregiver, [place], after IV fluids appointment.  CSW provided the following information regarding a psychiatrist appointment.    [facility] at [address] [place],  [place]  [address]  Main: [phone] Scheduled appointment with Dr. Adele Schilder, psychiatrist  January 27th, 2019 at 10:00am   OR IF NEEDS TO BE SEEN ASAP: [place] Walk-ins are welcome. Able to accept referrals 24/7. [address], [place] [address] [phone]    [name], [place] Worker [facility]

## 2018-08-17 ENCOUNTER — Inpatient Hospital Stay: Payer: Medicaid Other

## 2018-08-17 VITALS — BP 105/68 | HR 60 | Temp 97.6°F | Resp 18

## 2018-08-17 DIAGNOSIS — C539 Malignant neoplasm of cervix uteri, unspecified: Secondary | ICD-10-CM

## 2018-08-17 LAB — CBC WITH DIFFERENTIAL/PLATELET
Abs Immature Granulocytes: 0.12 10*3/uL — ABNORMAL HIGH (ref 0.00–0.07)
Basophils Absolute: 0 10*3/uL (ref 0.0–0.1)
Basophils Relative: 0 %
Eosinophils Absolute: 0 10*3/uL (ref 0.0–0.5)
Eosinophils Relative: 0 %
HCT: 29.6 % — ABNORMAL LOW (ref 36.0–46.0)
Hemoglobin: 9.7 g/dL — ABNORMAL LOW (ref 12.0–15.0)
Immature Granulocytes: 2 %
Lymphocytes Relative: 6 %
Lymphs Abs: 0.3 10*3/uL — ABNORMAL LOW (ref 0.7–4.0)
MCH: 31.6 pg (ref 26.0–34.0)
MCHC: 32.8 g/dL (ref 30.0–36.0)
MCV: 96.4 fL (ref 80.0–100.0)
Monocytes Absolute: 0.7 10*3/uL (ref 0.1–1.0)
Monocytes Relative: 14 %
Neutro Abs: 3.9 10*3/uL (ref 1.7–7.7)
Neutrophils Relative %: 78 %
Platelets: 148 10*3/uL — ABNORMAL LOW (ref 150–400)
RBC: 3.07 MIL/uL — ABNORMAL LOW (ref 3.87–5.11)
RDW: 19.2 % — ABNORMAL HIGH (ref 11.5–15.5)
WBC: 5 10*3/uL (ref 4.0–10.5)
nRBC: 0 % (ref 0.0–0.2)

## 2018-08-17 LAB — COMPREHENSIVE METABOLIC PANEL
ALK PHOS: 49 U/L (ref 38–126)
ALT: 8 U/L (ref 0–44)
AST: 14 U/L — ABNORMAL LOW (ref 15–41)
Albumin: 3.5 g/dL (ref 3.5–5.0)
Anion gap: 7 (ref 5–15)
BUN: 16 mg/dL (ref 8–23)
CO2: 31 mmol/L (ref 22–32)
Calcium: 8.9 mg/dL (ref 8.9–10.3)
Chloride: 102 mmol/L (ref 98–111)
Creatinine, Ser: 0.68 mg/dL (ref 0.44–1.00)
GFR calc Af Amer: >60 mL/min (ref >60)
GFR calc non Af Amer: >60 mL/min (ref >60)
Glucose, Bld: 97 mg/dL (ref 70–99)
Potassium: 3.6 mmol/L (ref 3.5–5.1)
SODIUM: 140 mmol/L (ref 135–145)
Total Bilirubin: 0.4 mg/dL (ref 0.3–1.2)
Total Protein: 6.3 g/dL — ABNORMAL LOW (ref 6.5–8.1)

## 2018-08-17 LAB — MAGNESIUM: Magnesium: 2 mg/dL (ref 1.7–2.4)

## 2018-08-17 MED ORDER — SODIUM CHLORIDE 0.9 % IV SOLN
Freq: Once | INTRAVENOUS | Status: AC
  Administered 2018-08-17: 14:00:00 via INTRAVENOUS
  Filled 2018-08-17: qty 250

## 2018-08-17 MED ORDER — SODIUM CHLORIDE 0.9% FLUSH
10.0000 mL | Freq: Once | INTRAVENOUS | Status: AC
  Administered 2018-08-17: 10 mL
  Filled 2018-08-17: qty 10

## 2018-08-17 MED ORDER — HEPARIN SOD (PORK) LOCK FLUSH 100 UNIT/ML IV SOLN
500.0000 [IU] | Freq: Once | INTRAVENOUS | Status: AC | PRN
  Administered 2018-08-17: 500 [IU]
  Filled 2018-08-17: qty 5

## 2018-08-17 MED ORDER — SODIUM CHLORIDE 0.9% FLUSH
10.0000 mL | Freq: Once | INTRAVENOUS | Status: AC | PRN
  Administered 2018-08-17: 10 mL
  Filled 2018-08-17: qty 10

## 2018-08-17 NOTE — Patient Instructions (Signed)
Dehydration, Adult Dehydration is when there is not enough fluid or water in your body. This happens when you lose more fluids than you take in. Dehydration can range from mild to very bad. It should be treated right away to keep it from getting very bad. Symptoms of mild dehydration may include:  Thirst.  Dry lips.  Slightly dry mouth.  Dry, warm skin.  Dizziness. Symptoms of moderate dehydration may include:  Very dry mouth.  Muscle cramps.  Dark pee (urine). Pee may be the color of tea.  Your body making less pee.  Your eyes making fewer tears.  Heartbeat that is uneven or faster than normal (palpitations).  Headache.  Light-headedness, especially when you stand up from sitting.  Fainting (syncope). Symptoms of very bad dehydration may include:  Changes in skin, such as: ? Cold and clammy skin. ? Blotchy (mottled) or pale skin. ? Skin that does not quickly return to normal after being lightly pinched and let go (poor skin turgor).  Changes in body fluids, such as: ? Feeling very thirsty. ? Your eyes making fewer tears. ? Not sweating when body temperature is high, such as in hot weather. ? Your body making very little pee.  Changes in vital signs, such as: ? Weak pulse. ? Pulse that is more than 100 beats a minute when you are sitting still. ? Fast breathing. ? Low blood pressure.  Other changes, such as: ? Sunken eyes. ? Cold hands and feet. ? Confusion. ? Lack of energy (lethargy). ? Trouble waking up from sleep. ? Short-term weight loss. ? Unconsciousness. Follow these instructions at home:  If told by your doctor, drink an ORS: ? Make an ORS by using instructions on the package. ? Start by drinking small amounts, about  cup (120 mL) every 5-10 minutes. ? Slowly drink more until you have had the amount that your doctor said to have.  Drink enough clear fluid to keep your pee clear or pale yellow. If you were told to drink an ORS, finish the ORS  first, then start slowly drinking clear fluids. Drink fluids such as: ? Water. Do not drink only water by itself. Doing that can make the salt (sodium) level in your body get too low (hyponatremia). ? Ice chips. ? Fruit juice that you have added water to (diluted). ? Low-calorie sports drinks.  Avoid: ? Alcohol. ? Drinks that have a lot of sugar. These include high-calorie sports drinks, fruit juice that does not have water added, and soda. ? Caffeine. ? Foods that are greasy or have a lot of fat or sugar.  Take over-the-counter and prescription medicines only as told by your doctor.  Do not take salt tablets. Doing that can make the salt level in your body get too high (hypernatremia).  Eat foods that have minerals (electrolytes). Examples include bananas, oranges, potatoes, tomatoes, and spinach.  Keep all follow-up visits as told by your doctor. This is important. Contact a doctor if:  You have belly (abdominal) pain that: ? Gets worse. ? Stays in one area (localizes).  You have a rash.  You have a stiff neck.  You get angry or annoyed more easily than normal (irritability).  You are more sleepy than normal.  You have a harder time waking up than normal.  You feel: ? Weak. ? Dizzy. ? Very thirsty.  You have peed (urinated) only a small amount of very dark pee during 6-8 hours. Get help right away if:  You have symptoms of   very bad dehydration.  You cannot drink fluids without throwing up (vomiting).  Your symptoms get worse with treatment.  You have a fever.  You have a very bad headache.  You are throwing up or having watery poop (diarrhea) and it: ? Gets worse. ? Does not go away.  You have blood or something green (bile) in your throw-up.  You have blood in your poop (stool). This may cause poop to look black and tarry.  You have not peed in 6-8 hours.  You pass out (faint).  Your heart rate when you are sitting still is more than 100 beats a  minute.  You have trouble breathing. This information is not intended to replace advice given to you by your health care provider. Make sure you discuss any questions you have with your health care provider. Document Released: 06/27/2009 Document Revised: 03/20/2016 Document Reviewed: 10/25/2015 Elsevier Interactive Patient Education  2018 Elsevier Inc.  

## 2018-08-19 ENCOUNTER — Inpatient Hospital Stay: Payer: Medicaid Other

## 2018-08-19 ENCOUNTER — Other Ambulatory Visit: Payer: Self-pay | Admitting: Hematology and Oncology

## 2018-08-19 VITALS — BP 103/56 | HR 84 | Temp 98.7°F | Resp 16

## 2018-08-19 DIAGNOSIS — C539 Malignant neoplasm of cervix uteri, unspecified: Secondary | ICD-10-CM

## 2018-08-19 LAB — COMPREHENSIVE METABOLIC PANEL
ALT: <6 U/L (ref 0–44)
AST: 9 U/L — ABNORMAL LOW (ref 15–41)
Albumin: 3.4 g/dL — ABNORMAL LOW (ref 3.5–5.0)
Alkaline Phosphatase: 59 U/L (ref 38–126)
Anion gap: 13 (ref 5–15)
BUN: 12 mg/dL (ref 8–23)
CO2: 25 mmol/L (ref 22–32)
Calcium: 8.6 mg/dL — ABNORMAL LOW (ref 8.9–10.3)
Chloride: 105 mmol/L (ref 98–111)
Creatinine, Ser: 0.79 mg/dL (ref 0.44–1.00)
GFR calc Af Amer: >60 mL/min (ref >60)
GFR calc non Af Amer: >60 mL/min (ref >60)
Glucose, Bld: 162 mg/dL — ABNORMAL HIGH (ref 70–99)
POTASSIUM: 3.3 mmol/L — AB (ref 3.5–5.1)
Sodium: 143 mmol/L (ref 135–145)
Total Bilirubin: 0.2 mg/dL — ABNORMAL LOW (ref 0.3–1.2)
Total Protein: 6.1 g/dL — ABNORMAL LOW (ref 6.5–8.1)

## 2018-08-19 LAB — CBC WITH DIFFERENTIAL/PLATELET
Abs Immature Granulocytes: 0.13 K/uL — ABNORMAL HIGH (ref 0.00–0.07)
Basophils Absolute: 0 K/uL (ref 0.0–0.1)
Basophils Relative: 0 %
Eosinophils Absolute: 0 K/uL (ref 0.0–0.5)
Eosinophils Relative: 0 %
HCT: 30.1 % — ABNORMAL LOW (ref 36.0–46.0)
Hemoglobin: 9.8 g/dL — ABNORMAL LOW (ref 12.0–15.0)
Immature Granulocytes: 2 %
Lymphocytes Relative: 6 %
Lymphs Abs: 0.4 K/uL — ABNORMAL LOW (ref 0.7–4.0)
MCH: 32.3 pg (ref 26.0–34.0)
MCHC: 32.6 g/dL (ref 30.0–36.0)
MCV: 99.3 fL (ref 80.0–100.0)
Monocytes Absolute: 0.7 K/uL (ref 0.1–1.0)
Monocytes Relative: 13 %
Neutro Abs: 4.5 K/uL (ref 1.7–7.7)
Neutrophils Relative %: 79 %
Platelets: 147 K/uL — ABNORMAL LOW (ref 150–400)
RBC: 3.03 MIL/uL — ABNORMAL LOW (ref 3.87–5.11)
RDW: 18.6 % — ABNORMAL HIGH (ref 11.5–15.5)
WBC: 5.7 K/uL (ref 4.0–10.5)
nRBC: 0 % (ref 0.0–0.2)

## 2018-08-19 LAB — MAGNESIUM: Magnesium: 1.8 mg/dL (ref 1.7–2.4)

## 2018-08-19 MED ORDER — SODIUM CHLORIDE 0.9% FLUSH
10.0000 mL | Freq: Once | INTRAVENOUS | Status: AC
  Administered 2018-08-19: 10 mL
  Filled 2018-08-19: qty 10

## 2018-08-19 MED ORDER — SODIUM CHLORIDE 0.9 % IV SOLN
Freq: Once | INTRAVENOUS | Status: DC
  Filled 2018-08-19: qty 250

## 2018-08-19 MED ORDER — HEPARIN SOD (PORK) LOCK FLUSH 100 UNIT/ML IV SOLN
500.0000 [IU] | Freq: Once | INTRAVENOUS | Status: AC | PRN
  Administered 2018-08-19: 500 [IU]
  Filled 2018-08-19: qty 5

## 2018-08-19 MED ORDER — SODIUM CHLORIDE 0.9% FLUSH
10.0000 mL | Freq: Once | INTRAVENOUS | Status: AC | PRN
  Administered 2018-08-19: 10 mL
  Filled 2018-08-19: qty 10

## 2018-08-19 MED ORDER — SODIUM CHLORIDE 0.9 % IV SOLN
INTRAVENOUS | Status: DC
  Administered 2018-08-19: 14:00:00 via INTRAVENOUS
  Filled 2018-08-19 (×3): qty 250

## 2018-08-19 NOTE — Patient Instructions (Signed)
Dehydration, Adult Dehydration is when there is not enough fluid or water in your body. This happens when you lose more fluids than you take in. Dehydration can range from mild to very bad. It should be treated right away to keep it from getting very bad. Symptoms of mild dehydration may include:  Thirst.  Dry lips.  Slightly dry mouth.  Dry, warm skin.  Dizziness. Symptoms of moderate dehydration may include:  Very dry mouth.  Muscle cramps.  Dark pee (urine). Pee may be the color of tea.  Your body making less pee.  Your eyes making fewer tears.  Heartbeat that is uneven or faster than normal (palpitations).  Headache.  Light-headedness, especially when you stand up from sitting.  Fainting (syncope). Symptoms of very bad dehydration may include:  Changes in skin, such as: ? Cold and clammy skin. ? Blotchy (mottled) or pale skin. ? Skin that does not quickly return to normal after being lightly pinched and let go (poor skin turgor).  Changes in body fluids, such as: ? Feeling very thirsty. ? Your eyes making fewer tears. ? Not sweating when body temperature is high, such as in hot weather. ? Your body making very little pee.  Changes in vital signs, such as: ? Weak pulse. ? Pulse that is more than 100 beats a minute when you are sitting still. ? Fast breathing. ? Low blood pressure.  Other changes, such as: ? Sunken eyes. ? Cold hands and feet. ? Confusion. ? Lack of energy (lethargy). ? Trouble waking up from sleep. ? Short-term weight loss. ? Unconsciousness. Follow these instructions at home:  If told by your doctor, drink an ORS: ? Make an ORS by using instructions on the package. ? Start by drinking small amounts, about  cup (120 mL) every 5-10 minutes. ? Slowly drink more until you have had the amount that your doctor said to have.  Drink enough clear fluid to keep your pee clear or pale yellow. If you were told to drink an ORS, finish the ORS  first, then start slowly drinking clear fluids. Drink fluids such as: ? Water. Do not drink only water by itself. Doing that can make the salt (sodium) level in your body get too low (hyponatremia). ? Ice chips. ? Fruit juice that you have added water to (diluted). ? Low-calorie sports drinks.  Avoid: ? Alcohol. ? Drinks that have a lot of sugar. These include high-calorie sports drinks, fruit juice that does not have water added, and soda. ? Caffeine. ? Foods that are greasy or have a lot of fat or sugar.  Take over-the-counter and prescription medicines only as told by your doctor.  Do not take salt tablets. Doing that can make the salt level in your body get too high (hypernatremia).  Eat foods that have minerals (electrolytes). Examples include bananas, oranges, potatoes, tomatoes, and spinach.  Keep all follow-up visits as told by your doctor. This is important. Contact a doctor if:  You have belly (abdominal) pain that: ? Gets worse. ? Stays in one area (localizes).  You have a rash.  You have a stiff neck.  You get angry or annoyed more easily than normal (irritability).  You are more sleepy than normal.  You have a harder time waking up than normal.  You feel: ? Weak. ? Dizzy. ? Very thirsty.  You have peed (urinated) only a small amount of very dark pee during 6-8 hours. Get help right away if:  You have symptoms of   very bad dehydration.  You cannot drink fluids without throwing up (vomiting).  Your symptoms get worse with treatment.  You have a fever.  You have a very bad headache.  You are throwing up or having watery poop (diarrhea) and it: ? Gets worse. ? Does not go away.  You have blood or something green (bile) in your throw-up.  You have blood in your poop (stool). This may cause poop to look black and tarry.  You have not peed in 6-8 hours.  You pass out (faint).  Your heart rate when you are sitting still is more than 100 beats a  minute.  You have trouble breathing. This information is not intended to replace advice given to you by your health care provider. Make sure you discuss any questions you have with your health care provider. Document Released: 06/27/2009 Document Revised: 03/20/2016 Document Reviewed: 10/25/2015 Elsevier Interactive Patient Education  2018 [employer]  Dehydration, Adult Dehydration is when there is not enough fluid or water in your body. This happens when you lose more fluids than you take in. Dehydration can range from mild to very bad. It should be treated right away to keep it from getting very bad. Symptoms of mild dehydration may include:  Thirst.  Dry lips.  Slightly dry mouth.  Dry, warm skin.  Dizziness. Symptoms of moderate dehydration may include:  Very dry mouth.  Muscle cramps.  Dark pee (urine). Pee may be the color of tea.  Your body making less pee.  Your eyes making fewer tears.  Heartbeat that is uneven or faster than normal (palpitations).  Headache.  Light-headedness, especially when you stand up from sitting.  Fainting (syncope). Symptoms of very bad dehydration may include:  Changes in skin, such as: ? Cold and clammy skin. ? Blotchy (mottled) or pale skin. ? Skin that does not quickly return to normal after being lightly pinched and let go (poor skin turgor).  Changes in body fluids, such as: ? Feeling very thirsty. ? Your eyes making fewer tears. ? Not sweating when body temperature is high, such as in hot weather. ? Your body making very little pee.  Changes in vital signs, such as: ? Weak pulse. ? Pulse that is more than 100 beats a minute when you are sitting still. ? Fast breathing. ? Low blood pressure.  Other changes, such as: ? Sunken eyes. ? Cold hands and feet. ? Confusion. ? Lack of energy (lethargy). ? Trouble waking up from sleep. ? Short-term weight loss. ? Unconsciousness. Follow these instructions at  home:  If told by your doctor, drink an ORS: ? Make an ORS by using instructions on the package. ? Start by drinking small amounts, about  cup (120 mL) every 5-10 minutes. ? Slowly drink more until you have had the amount that your doctor said to have.  Drink enough clear fluid to keep your pee clear or pale yellow. If you were told to drink an ORS, finish the ORS first, then start slowly drinking clear fluids. Drink fluids such as: ? Water. Do not drink only water by itself. Doing that can make the salt (sodium) level in your body get too low (hyponatremia). ? Ice chips. ? Fruit juice that you have added water to (diluted). ? Low-calorie sports drinks.  Avoid: ? Alcohol. ? Drinks that have a lot of sugar. These include high-calorie sports drinks, fruit juice that does not have water added, and soda. ? Caffeine. ? Foods that are greasy or have   a lot of fat or sugar.  Take over-the-counter and prescription medicines only as told by your doctor.  Do not take salt tablets. Doing that can make the salt level in your body get too high (hypernatremia).  Eat foods that have minerals (electrolytes). Examples include bananas, oranges, potatoes, tomatoes, and spinach.  Keep all follow-up visits as told by your doctor. This is important. Contact a doctor if:  You have belly (abdominal) pain that: ? Gets worse. ? Stays in one area (localizes).  You have a rash.  You have a stiff neck.  You get angry or annoyed more easily than normal (irritability).  You are more sleepy than normal.  You have a harder time waking up than normal.  You feel: ? Weak. ? Dizzy. ? Very thirsty.  You have peed (urinated) only a small amount of very dark pee during 6-8 hours. Get help right away if:  You have symptoms of very bad dehydration.  You cannot drink fluids without throwing up (vomiting).  Your symptoms get worse with treatment.  You have a fever.  You have a very bad headache.  You  are throwing up or having watery poop (diarrhea) and it: ? Gets worse. ? Does not go away.  You have blood or something green (bile) in your throw-up.  You have blood in your poop (stool). This may cause poop to look black and tarry.  You have not peed in 6-8 hours.  You pass out (faint).  Your heart rate when you are sitting still is more than 100 beats a minute.  You have trouble breathing. This information is not intended to replace advice given to you by your health care provider. Make sure you discuss any questions you have with your health care provider. Document Released: 06/27/2009 Document Revised: 03/20/2016 Document Reviewed: 10/25/2015 Elsevier Interactive Patient Education  2018 [employer] Inc.  

## 2018-08-22 ENCOUNTER — Inpatient Hospital Stay: Payer: Medicaid Other | Admitting: Nutrition

## 2018-08-22 ENCOUNTER — Inpatient Hospital Stay: Payer: Medicaid Other

## 2018-08-22 VITALS — Wt 80.2 lb

## 2018-08-22 DIAGNOSIS — C539 Malignant neoplasm of cervix uteri, unspecified: Secondary | ICD-10-CM | POA: Diagnosis not present

## 2018-08-22 LAB — COMPREHENSIVE METABOLIC PANEL
ALT: 7 U/L (ref 0–44)
AST: 11 U/L — AB (ref 15–41)
Albumin: 3.4 g/dL — ABNORMAL LOW (ref 3.5–5.0)
Alkaline Phosphatase: 58 U/L (ref 38–126)
Anion gap: 7 (ref 5–15)
BUN: 12 mg/dL (ref 8–23)
CO2: 31 mmol/L (ref 22–32)
Calcium: 9.1 mg/dL (ref 8.9–10.3)
Chloride: 105 mmol/L (ref 98–111)
Creatinine, Ser: 0.74 mg/dL (ref 0.44–1.00)
GFR calc Af Amer: >60 mL/min (ref >60)
GFR calc non Af Amer: >60 mL/min (ref >60)
Glucose, Bld: 85 mg/dL (ref 70–99)
Potassium: 3.4 mmol/L — ABNORMAL LOW (ref 3.5–5.1)
Sodium: 143 mmol/L (ref 135–145)
Total Bilirubin: 0.3 mg/dL (ref 0.3–1.2)
Total Protein: 6 g/dL — ABNORMAL LOW (ref 6.5–8.1)

## 2018-08-22 LAB — CBC WITH DIFFERENTIAL/PLATELET
Abs Immature Granulocytes: 0.14 10*3/uL — ABNORMAL HIGH (ref 0.00–0.07)
Basophils Absolute: 0 10*3/uL (ref 0.0–0.1)
Basophils Relative: 0 %
Eosinophils Absolute: 0 10*3/uL (ref 0.0–0.5)
Eosinophils Relative: 0 %
HEMATOCRIT: 31.2 % — AB (ref 36.0–46.0)
Hemoglobin: 10.2 g/dL — ABNORMAL LOW (ref 12.0–15.0)
Immature Granulocytes: 3 %
LYMPHS PCT: 8 %
Lymphs Abs: 0.4 10*3/uL — ABNORMAL LOW (ref 0.7–4.0)
MCH: 32.1 pg (ref 26.0–34.0)
MCHC: 32.7 g/dL (ref 30.0–36.0)
MCV: 98.1 fL (ref 80.0–100.0)
Monocytes Absolute: 0.7 10*3/uL (ref 0.1–1.0)
Monocytes Relative: 15 %
Neutro Abs: 3.7 10*3/uL (ref 1.7–7.7)
Neutrophils Relative %: 74 %
Platelets: 176 10*3/uL (ref 150–400)
RBC: 3.18 MIL/uL — ABNORMAL LOW (ref 3.87–5.11)
RDW: 17.7 % — ABNORMAL HIGH (ref 11.5–15.5)
WBC: 5 10*3/uL (ref 4.0–10.5)
nRBC: 0 % (ref 0.0–0.2)

## 2018-08-22 LAB — MAGNESIUM: Magnesium: 1.9 mg/dL (ref 1.7–2.4)

## 2018-08-22 MED ORDER — SODIUM CHLORIDE 0.9% FLUSH
10.0000 mL | Freq: Once | INTRAVENOUS | Status: AC
  Administered 2018-08-22: 10 mL
  Filled 2018-08-22: qty 10

## 2018-08-22 MED ORDER — SODIUM CHLORIDE 0.9 % IV SOLN
Freq: Once | INTRAVENOUS | Status: AC
  Administered 2018-08-22: 15:00:00 via INTRAVENOUS
  Filled 2018-08-22: qty 250

## 2018-08-22 NOTE — Progress Notes (Signed)
Brief nutrition follow-up completed with patient during IV fluids. Patient's caregiver Erin Kent was not present. Weight improved and documented as 80 pounds December 9. Reviewed labs. Patient continues to deny nutrition impact symptoms.   She had a bowl of chicken noodle soup.  She is refusing additional food today.  Nutrition diagnosis: Severe malnutrition continues.  Intervention: Educated patient to continue strategies for increased calories and protein.  Provided supportive listening. Patient is not willing to drink oral nutrition supplements. She continues to refuse offers of food.  Monitoring, evaluation, goals: Patient will increase intake to promote weight gain.  Nutrition follow-up to be scheduled as needed.  Patient is not really willing to increase oral intake based on my suggestions.  **Disclaimer: This note was dictated with voice recognition software. Similar sounding words can inadvertently be transcribed and this note may contain transcription errors which may not have been corrected upon publication of note.**

## 2018-08-22 NOTE — Patient Instructions (Signed)
Dehydration, Adult Dehydration is when there is not enough fluid or water in your body. This happens when you lose more fluids than you take in. Dehydration can range from mild to very bad. It should be treated right away to keep it from getting very bad. Symptoms of mild dehydration may include:  Thirst.  Dry lips.  Slightly dry mouth.  Dry, warm skin.  Dizziness. Symptoms of moderate dehydration may include:  Very dry mouth.  Muscle cramps.  Dark pee (urine). Pee may be the color of tea.  Your body making less pee.  Your eyes making fewer tears.  Heartbeat that is uneven or faster than normal (palpitations).  Headache.  Light-headedness, especially when you stand up from sitting.  Fainting (syncope). Symptoms of very bad dehydration may include:  Changes in skin, such as: ? Cold and clammy skin. ? Blotchy (mottled) or pale skin. ? Skin that does not quickly return to normal after being lightly pinched and let go (poor skin turgor).  Changes in body fluids, such as: ? Feeling very thirsty. ? Your eyes making fewer tears. ? Not sweating when body temperature is high, such as in hot weather. ? Your body making very little pee.  Changes in vital signs, such as: ? Weak pulse. ? Pulse that is more than 100 beats a minute when you are sitting still. ? Fast breathing. ? Low blood pressure.  Other changes, such as: ? Sunken eyes. ? Cold hands and feet. ? Confusion. ? Lack of energy (lethargy). ? Trouble waking up from sleep. ? Short-term weight loss. ? Unconsciousness. Follow these instructions at home:  If told by your doctor, drink an ORS: ? Make an ORS by using instructions on the package. ? Start by drinking small amounts, about  cup (120 mL) every 5-10 minutes. ? Slowly drink more until you have had the amount that your doctor said to have.  Drink enough clear fluid to keep your pee clear or pale yellow. If you were told to drink an ORS, finish the ORS  first, then start slowly drinking clear fluids. Drink fluids such as: ? Water. Do not drink only water by itself. Doing that can make the salt (sodium) level in your body get too low (hyponatremia). ? Ice chips. ? Fruit juice that you have added water to (diluted). ? Low-calorie sports drinks.  Avoid: ? Alcohol. ? Drinks that have a lot of sugar. These include high-calorie sports drinks, fruit juice that does not have water added, and soda. ? Caffeine. ? Foods that are greasy or have a lot of fat or sugar.  Take over-the-counter and prescription medicines only as told by your doctor.  Do not take salt tablets. Doing that can make the salt level in your body get too high (hypernatremia).  Eat foods that have minerals (electrolytes). Examples include bananas, oranges, potatoes, tomatoes, and spinach.  Keep all follow-up visits as told by your doctor. This is important. Contact a doctor if:  You have belly (abdominal) pain that: ? Gets worse. ? Stays in one area (localizes).  You have a rash.  You have a stiff neck.  You get angry or annoyed more easily than normal (irritability).  You are more sleepy than normal.  You have a harder time waking up than normal.  You feel: ? Weak. ? Dizzy. ? Very thirsty.  You have peed (urinated) only a small amount of very dark pee during 6-8 hours. Get help right away if:  You have symptoms of   very bad dehydration.  You cannot drink fluids without throwing up (vomiting).  Your symptoms get worse with treatment.  You have a fever.  You have a very bad headache.  You are throwing up or having watery poop (diarrhea) and it: ? Gets worse. ? Does not go away.  You have blood or something green (bile) in your throw-up.  You have blood in your poop (stool). This may cause poop to look black and tarry.  You have not peed in 6-8 hours.  You pass out (faint).  Your heart rate when you are sitting still is more than 100 beats a  minute.  You have trouble breathing. This information is not intended to replace advice given to you by your health care provider. Make sure you discuss any questions you have with your health care provider. Document Released: 06/27/2009 Document Revised: 03/20/2016 Document Reviewed: 10/25/2015 Elsevier Interactive Patient Education  2018 [employer]  Dehydration, Adult Dehydration is when there is not enough fluid or water in your body. This happens when you lose more fluids than you take in. Dehydration can range from mild to very bad. It should be treated right away to keep it from getting very bad. Symptoms of mild dehydration may include:  Thirst.  Dry lips.  Slightly dry mouth.  Dry, warm skin.  Dizziness. Symptoms of moderate dehydration may include:  Very dry mouth.  Muscle cramps.  Dark pee (urine). Pee may be the color of tea.  Your body making less pee.  Your eyes making fewer tears.  Heartbeat that is uneven or faster than normal (palpitations).  Headache.  Light-headedness, especially when you stand up from sitting.  Fainting (syncope). Symptoms of very bad dehydration may include:  Changes in skin, such as: ? Cold and clammy skin. ? Blotchy (mottled) or pale skin. ? Skin that does not quickly return to normal after being lightly pinched and let go (poor skin turgor).  Changes in body fluids, such as: ? Feeling very thirsty. ? Your eyes making fewer tears. ? Not sweating when body temperature is high, such as in hot weather. ? Your body making very little pee.  Changes in vital signs, such as: ? Weak pulse. ? Pulse that is more than 100 beats a minute when you are sitting still. ? Fast breathing. ? Low blood pressure.  Other changes, such as: ? Sunken eyes. ? Cold hands and feet. ? Confusion. ? Lack of energy (lethargy). ? Trouble waking up from sleep. ? Short-term weight loss. ? Unconsciousness. Follow these instructions at  home:  If told by your doctor, drink an ORS: ? Make an ORS by using instructions on the package. ? Start by drinking small amounts, about  cup (120 mL) every 5-10 minutes. ? Slowly drink more until you have had the amount that your doctor said to have.  Drink enough clear fluid to keep your pee clear or pale yellow. If you were told to drink an ORS, finish the ORS first, then start slowly drinking clear fluids. Drink fluids such as: ? Water. Do not drink only water by itself. Doing that can make the salt (sodium) level in your body get too low (hyponatremia). ? Ice chips. ? Fruit juice that you have added water to (diluted). ? Low-calorie sports drinks.  Avoid: ? Alcohol. ? Drinks that have a lot of sugar. These include high-calorie sports drinks, fruit juice that does not have water added, and soda. ? Caffeine. ? Foods that are greasy or have   a lot of fat or sugar.  Take over-the-counter and prescription medicines only as told by your doctor.  Do not take salt tablets. Doing that can make the salt level in your body get too high (hypernatremia).  Eat foods that have minerals (electrolytes). Examples include bananas, oranges, potatoes, tomatoes, and spinach.  Keep all follow-up visits as told by your doctor. This is important. Contact a doctor if:  You have belly (abdominal) pain that: ? Gets worse. ? Stays in one area (localizes).  You have a rash.  You have a stiff neck.  You get angry or annoyed more easily than normal (irritability).  You are more sleepy than normal.  You have a harder time waking up than normal.  You feel: ? Weak. ? Dizzy. ? Very thirsty.  You have peed (urinated) only a small amount of very dark pee during 6-8 hours. Get help right away if:  You have symptoms of very bad dehydration.  You cannot drink fluids without throwing up (vomiting).  Your symptoms get worse with treatment.  You have a fever.  You have a very bad headache.  You  are throwing up or having watery poop (diarrhea) and it: ? Gets worse. ? Does not go away.  You have blood or something green (bile) in your throw-up.  You have blood in your poop (stool). This may cause poop to look black and tarry.  You have not peed in 6-8 hours.  You pass out (faint).  Your heart rate when you are sitting still is more than 100 beats a minute.  You have trouble breathing. This information is not intended to replace advice given to you by your health care provider. Make sure you discuss any questions you have with your health care provider. Document Released: 06/27/2009 Document Revised: 03/20/2016 Document Reviewed: 10/25/2015 Elsevier Interactive Patient Education  2018 [employer] Inc.  

## 2018-08-24 ENCOUNTER — Telehealth: Payer: Self-pay | Admitting: Hematology and Oncology

## 2018-08-24 ENCOUNTER — Telehealth: Payer: Self-pay

## 2018-08-24 ENCOUNTER — Inpatient Hospital Stay: Payer: Medicaid Other

## 2018-08-24 ENCOUNTER — Other Ambulatory Visit: Payer: Self-pay

## 2018-08-24 VITALS — BP 118/79 | HR 76 | Temp 98.8°F | Resp 16

## 2018-08-24 DIAGNOSIS — C539 Malignant neoplasm of cervix uteri, unspecified: Secondary | ICD-10-CM

## 2018-08-24 LAB — COMPREHENSIVE METABOLIC PANEL
ALBUMIN: 3.5 g/dL (ref 3.5–5.0)
ALT: 7 U/L (ref 0–44)
AST: 12 U/L — ABNORMAL LOW (ref 15–41)
Alkaline Phosphatase: 60 U/L (ref 38–126)
Anion gap: 8 (ref 5–15)
BUN: 14 mg/dL (ref 8–23)
CO2: 29 mmol/L (ref 22–32)
Calcium: 8.7 mg/dL — ABNORMAL LOW (ref 8.9–10.3)
Chloride: 106 mmol/L (ref 98–111)
Creatinine, Ser: 0.98 mg/dL (ref 0.44–1.00)
GFR calc Af Amer: >60 mL/min (ref >60)
GFR calc non Af Amer: >60 mL/min (ref >60)
Glucose, Bld: 83 mg/dL (ref 70–99)
Potassium: 4 mmol/L (ref 3.5–5.1)
Sodium: 143 mmol/L (ref 135–145)
Total Bilirubin: 0.3 mg/dL (ref 0.3–1.2)
Total Protein: 6.4 g/dL — ABNORMAL LOW (ref 6.5–8.1)

## 2018-08-24 LAB — CBC WITH DIFFERENTIAL/PLATELET
Abs Immature Granulocytes: 0.15 10*3/uL — ABNORMAL HIGH (ref 0.00–0.07)
Basophils Absolute: 0 10*3/uL (ref 0.0–0.1)
Basophils Relative: 0 %
EOS ABS: 0 10*3/uL (ref 0.0–0.5)
Eosinophils Relative: 0 %
HEMATOCRIT: 32.1 % — AB (ref 36.0–46.0)
Hemoglobin: 10.4 g/dL — ABNORMAL LOW (ref 12.0–15.0)
Immature Granulocytes: 3 %
LYMPHS PCT: 6 %
Lymphs Abs: 0.4 10*3/uL — ABNORMAL LOW (ref 0.7–4.0)
MCH: 32.1 pg (ref 26.0–34.0)
MCHC: 32.4 g/dL (ref 30.0–36.0)
MCV: 99.1 fL (ref 80.0–100.0)
MONOS PCT: 14 %
Monocytes Absolute: 0.8 10*3/uL (ref 0.1–1.0)
Neutro Abs: 4.3 10*3/uL (ref 1.7–7.7)
Neutrophils Relative %: 77 %
Platelets: 189 10*3/uL (ref 150–400)
RBC: 3.24 MIL/uL — ABNORMAL LOW (ref 3.87–5.11)
RDW: 17.4 % — ABNORMAL HIGH (ref 11.5–15.5)
WBC: 5.6 10*3/uL (ref 4.0–10.5)
nRBC: 0 % (ref 0.0–0.2)

## 2018-08-24 LAB — MAGNESIUM: Magnesium: 2 mg/dL (ref 1.7–2.4)

## 2018-08-24 MED ORDER — SODIUM CHLORIDE 0.9 % IV SOLN
Freq: Once | INTRAVENOUS | Status: AC
  Administered 2018-08-24: 14:00:00 via INTRAVENOUS
  Filled 2018-08-24: qty 250

## 2018-08-24 MED ORDER — HEPARIN SOD (PORK) LOCK FLUSH 100 UNIT/ML IV SOLN
500.0000 [IU] | Freq: Once | INTRAVENOUS | Status: AC | PRN
  Administered 2018-08-24: 500 [IU]
  Filled 2018-08-24: qty 5

## 2018-08-24 MED ORDER — SODIUM CHLORIDE 0.9% FLUSH
10.0000 mL | Freq: Once | INTRAVENOUS | Status: AC | PRN
  Administered 2018-08-24: 10 mL
  Filled 2018-08-24: qty 10

## 2018-08-24 MED ORDER — SODIUM CHLORIDE 0.9% FLUSH
10.0000 mL | Freq: Once | INTRAVENOUS | Status: AC
  Administered 2018-08-24: 10 mL
  Filled 2018-08-24: qty 10

## 2018-08-24 NOTE — Telephone Encounter (Signed)
Fannie came to scheduling requesting that all appointments be canceled on 12/13 due to possible weather.  Scheduling message sent to reschedule to 12/16.

## 2018-08-24 NOTE — Patient Instructions (Signed)
Dehydration, Adult Dehydration is when there is not enough fluid or water in your body. This happens when you lose more fluids than you take in. Dehydration can range from mild to very bad. It should be treated right away to keep it from getting very bad. Symptoms of mild dehydration may include:  Thirst.  Dry lips.  Slightly dry mouth.  Dry, warm skin.  Dizziness. Symptoms of moderate dehydration may include:  Very dry mouth.  Muscle cramps.  Dark pee (urine). Pee may be the color of tea.  Your body making less pee.  Your eyes making fewer tears.  Heartbeat that is uneven or faster than normal (palpitations).  Headache.  Light-headedness, especially when you stand up from sitting.  Fainting (syncope). Symptoms of very bad dehydration may include:  Changes in skin, such as: ? Cold and clammy skin. ? Blotchy (mottled) or pale skin. ? Skin that does not quickly return to normal after being lightly pinched and let go (poor skin turgor).  Changes in body fluids, such as: ? Feeling very thirsty. ? Your eyes making fewer tears. ? Not sweating when body temperature is high, such as in hot weather. ? Your body making very little pee.  Changes in vital signs, such as: ? Weak pulse. ? Pulse that is more than 100 beats a minute when you are sitting still. ? Fast breathing. ? Low blood pressure.  Other changes, such as: ? Sunken eyes. ? Cold hands and feet. ? Confusion. ? Lack of energy (lethargy). ? Trouble waking up from sleep. ? Short-term weight loss. ? Unconsciousness. Follow these instructions at home:  If told by your doctor, drink an ORS: ? Make an ORS by using instructions on the package. ? Start by drinking small amounts, about  cup (120 mL) every 5-10 minutes. ? Slowly drink more until you have had the amount that your doctor said to have.  Drink enough clear fluid to keep your pee clear or pale yellow. If you were told to drink an ORS, finish the ORS  first, then start slowly drinking clear fluids. Drink fluids such as: ? Water. Do not drink only water by itself. Doing that can make the salt (sodium) level in your body get too low (hyponatremia). ? Ice chips. ? Fruit juice that you have added water to (diluted). ? Low-calorie sports drinks.  Avoid: ? Alcohol. ? Drinks that have a lot of sugar. These include high-calorie sports drinks, fruit juice that does not have water added, and soda. ? Caffeine. ? Foods that are greasy or have a lot of fat or sugar.  Take over-the-counter and prescription medicines only as told by your doctor.  Do not take salt tablets. Doing that can make the salt level in your body get too high (hypernatremia).  Eat foods that have minerals (electrolytes). Examples include bananas, oranges, potatoes, tomatoes, and spinach.  Keep all follow-up visits as told by your doctor. This is important. Contact a doctor if:  You have belly (abdominal) pain that: ? Gets worse. ? Stays in one area (localizes).  You have a rash.  You have a stiff neck.  You get angry or annoyed more easily than normal (irritability).  You are more sleepy than normal.  You have a harder time waking up than normal.  You feel: ? Weak. ? Dizzy. ? Very thirsty.  You have peed (urinated) only a small amount of very dark pee during 6-8 hours. Get help right away if:  You have symptoms of   very bad dehydration.  You cannot drink fluids without throwing up (vomiting).  Your symptoms get worse with treatment.  You have a fever.  You have a very bad headache.  You are throwing up or having watery poop (diarrhea) and it: ? Gets worse. ? Does not go away.  You have blood or something green (bile) in your throw-up.  You have blood in your poop (stool). This may cause poop to look black and tarry.  You have not peed in 6-8 hours.  You pass out (faint).  Your heart rate when you are sitting still is more than 100 beats a  minute.  You have trouble breathing. This information is not intended to replace advice given to you by your health care provider. Make sure you discuss any questions you have with your health care provider. Document Released: 06/27/2009 Document Revised: 03/20/2016 Document Reviewed: 10/25/2015 Elsevier Interactive Patient Education  2018 [employer]  Dehydration, Adult Dehydration is when there is not enough fluid or water in your body. This happens when you lose more fluids than you take in. Dehydration can range from mild to very bad. It should be treated right away to keep it from getting very bad. Symptoms of mild dehydration may include:  Thirst.  Dry lips.  Slightly dry mouth.  Dry, warm skin.  Dizziness. Symptoms of moderate dehydration may include:  Very dry mouth.  Muscle cramps.  Dark pee (urine). Pee may be the color of tea.  Your body making less pee.  Your eyes making fewer tears.  Heartbeat that is uneven or faster than normal (palpitations).  Headache.  Light-headedness, especially when you stand up from sitting.  Fainting (syncope). Symptoms of very bad dehydration may include:  Changes in skin, such as: ? Cold and clammy skin. ? Blotchy (mottled) or pale skin. ? Skin that does not quickly return to normal after being lightly pinched and let go (poor skin turgor).  Changes in body fluids, such as: ? Feeling very thirsty. ? Your eyes making fewer tears. ? Not sweating when body temperature is high, such as in hot weather. ? Your body making very little pee.  Changes in vital signs, such as: ? Weak pulse. ? Pulse that is more than 100 beats a minute when you are sitting still. ? Fast breathing. ? Low blood pressure.  Other changes, such as: ? Sunken eyes. ? Cold hands and feet. ? Confusion. ? Lack of energy (lethargy). ? Trouble waking up from sleep. ? Short-term weight loss. ? Unconsciousness. Follow these instructions at  home:  If told by your doctor, drink an ORS: ? Make an ORS by using instructions on the package. ? Start by drinking small amounts, about  cup (120 mL) every 5-10 minutes. ? Slowly drink more until you have had the amount that your doctor said to have.  Drink enough clear fluid to keep your pee clear or pale yellow. If you were told to drink an ORS, finish the ORS first, then start slowly drinking clear fluids. Drink fluids such as: ? Water. Do not drink only water by itself. Doing that can make the salt (sodium) level in your body get too low (hyponatremia). ? Ice chips. ? Fruit juice that you have added water to (diluted). ? Low-calorie sports drinks.  Avoid: ? Alcohol. ? Drinks that have a lot of sugar. These include high-calorie sports drinks, fruit juice that does not have water added, and soda. ? Caffeine. ? Foods that are greasy or have   a lot of fat or sugar.  Take over-the-counter and prescription medicines only as told by your doctor.  Do not take salt tablets. Doing that can make the salt level in your body get too high (hypernatremia).  Eat foods that have minerals (electrolytes). Examples include bananas, oranges, potatoes, tomatoes, and spinach.  Keep all follow-up visits as told by your doctor. This is important. Contact a doctor if:  You have belly (abdominal) pain that: ? Gets worse. ? Stays in one area (localizes).  You have a rash.  You have a stiff neck.  You get angry or annoyed more easily than normal (irritability).  You are more sleepy than normal.  You have a harder time waking up than normal.  You feel: ? Weak. ? Dizzy. ? Very thirsty.  You have peed (urinated) only a small amount of very dark pee during 6-8 hours. Get help right away if:  You have symptoms of very bad dehydration.  You cannot drink fluids without throwing up (vomiting).  Your symptoms get worse with treatment.  You have a fever.  You have a very bad headache.  You  are throwing up or having watery poop (diarrhea) and it: ? Gets worse. ? Does not go away.  You have blood or something green (bile) in your throw-up.  You have blood in your poop (stool). This may cause poop to look black and tarry.  You have not peed in 6-8 hours.  You pass out (faint).  Your heart rate when you are sitting still is more than 100 beats a minute.  You have trouble breathing. This information is not intended to replace advice given to you by your health care provider. Make sure you discuss any questions you have with your health care provider. Document Released: 06/27/2009 Document Revised: 03/20/2016 Document Reviewed: 10/25/2015 Elsevier Interactive Patient Education  2018 [employer] Inc.  

## 2018-08-24 NOTE — Telephone Encounter (Signed)
Patient care taker came in to reschedule. Per Erin Kent cancel patient appointments waiting for fluids on 12/16. Gave calendar for other appointments

## 2018-08-26 ENCOUNTER — Inpatient Hospital Stay: Payer: Medicaid Other

## 2018-08-26 ENCOUNTER — Inpatient Hospital Stay: Payer: Medicaid Other | Admitting: Hematology and Oncology

## 2018-08-26 ENCOUNTER — Telehealth: Payer: Self-pay | Admitting: Hematology and Oncology

## 2018-08-26 NOTE — Telephone Encounter (Signed)
Called regarding 12/16 °

## 2018-08-29 ENCOUNTER — Inpatient Hospital Stay (HOSPITAL_BASED_OUTPATIENT_CLINIC_OR_DEPARTMENT_OTHER): Payer: Medicaid Other | Admitting: Hematology and Oncology

## 2018-08-29 ENCOUNTER — Inpatient Hospital Stay: Payer: Medicaid Other

## 2018-08-29 ENCOUNTER — Telehealth: Payer: Self-pay | Admitting: Hematology and Oncology

## 2018-08-29 ENCOUNTER — Other Ambulatory Visit: Payer: Medicaid Other

## 2018-08-29 VITALS — BP 139/84 | HR 73 | Temp 98.5°F | Resp 16

## 2018-08-29 DIAGNOSIS — C539 Malignant neoplasm of cervix uteri, unspecified: Secondary | ICD-10-CM

## 2018-08-29 DIAGNOSIS — Z79899 Other long term (current) drug therapy: Secondary | ICD-10-CM

## 2018-08-29 DIAGNOSIS — D61818 Other pancytopenia: Secondary | ICD-10-CM

## 2018-08-29 DIAGNOSIS — R64 Cachexia: Secondary | ICD-10-CM

## 2018-08-29 DIAGNOSIS — Z923 Personal history of irradiation: Secondary | ICD-10-CM

## 2018-08-29 DIAGNOSIS — Z9221 Personal history of antineoplastic chemotherapy: Secondary | ICD-10-CM

## 2018-08-29 MED ORDER — SODIUM CHLORIDE 0.9 % IV SOLN
Freq: Once | INTRAVENOUS | Status: AC
  Administered 2018-08-29: 09:00:00 via INTRAVENOUS
  Filled 2018-08-29: qty 250

## 2018-08-29 MED ORDER — HEPARIN SOD (PORK) LOCK FLUSH 100 UNIT/ML IV SOLN
500.0000 [IU] | Freq: Once | INTRAVENOUS | Status: AC | PRN
  Administered 2018-08-29: 500 [IU]
  Filled 2018-08-29: qty 5

## 2018-08-29 MED ORDER — SODIUM CHLORIDE 0.9% FLUSH
10.0000 mL | Freq: Once | INTRAVENOUS | Status: AC | PRN
  Administered 2018-08-29: 10 mL
  Filled 2018-08-29: qty 10

## 2018-08-29 NOTE — Patient Instructions (Signed)
Dehydration, Adult Dehydration is when there is not enough fluid or water in your body. This happens when you lose more fluids than you take in. Dehydration can range from mild to very bad. It should be treated right away to keep it from getting very bad. Symptoms of mild dehydration may include:  Thirst.  Dry lips.  Slightly dry mouth.  Dry, warm skin.  Dizziness. Symptoms of moderate dehydration may include:  Very dry mouth.  Muscle cramps.  Dark pee (urine). Pee may be the color of tea.  Your body making less pee.  Your eyes making fewer tears.  Heartbeat that is uneven or faster than normal (palpitations).  Headache.  Light-headedness, especially when you stand up from sitting.  Fainting (syncope). Symptoms of very bad dehydration may include:  Changes in skin, such as: ? Cold and clammy skin. ? Blotchy (mottled) or pale skin. ? Skin that does not quickly return to normal after being lightly pinched and let go (poor skin turgor).  Changes in body fluids, such as: ? Feeling very thirsty. ? Your eyes making fewer tears. ? Not sweating when body temperature is high, such as in hot weather. ? Your body making very little pee.  Changes in vital signs, such as: ? Weak pulse. ? Pulse that is more than 100 beats a minute when you are sitting still. ? Fast breathing. ? Low blood pressure.  Other changes, such as: ? Sunken eyes. ? Cold hands and feet. ? Confusion. ? Lack of energy (lethargy). ? Trouble waking up from sleep. ? Short-term weight loss. ? Unconsciousness. Follow these instructions at home:  If told by your doctor, drink an ORS: ? Make an ORS by using instructions on the package. ? Start by drinking small amounts, about  cup (120 mL) every 5-10 minutes. ? Slowly drink more until you have had the amount that your doctor said to have.  Drink enough clear fluid to keep your pee clear or pale yellow. If you were told to drink an ORS, finish the ORS  first, then start slowly drinking clear fluids. Drink fluids such as: ? Water. Do not drink only water by itself. Doing that can make the salt (sodium) level in your body get too low (hyponatremia). ? Ice chips. ? Fruit juice that you have added water to (diluted). ? Low-calorie sports drinks.  Avoid: ? Alcohol. ? Drinks that have a lot of sugar. These include high-calorie sports drinks, fruit juice that does not have water added, and soda. ? Caffeine. ? Foods that are greasy or have a lot of fat or sugar.  Take over-the-counter and prescription medicines only as told by your doctor.  Do not take salt tablets. Doing that can make the salt level in your body get too high (hypernatremia).  Eat foods that have minerals (electrolytes). Examples include bananas, oranges, potatoes, tomatoes, and spinach.  Keep all follow-up visits as told by your doctor. This is important. Contact a doctor if:  You have belly (abdominal) pain that: ? Gets worse. ? Stays in one area (localizes).  You have a rash.  You have a stiff neck.  You get angry or annoyed more easily than normal (irritability).  You are more sleepy than normal.  You have a harder time waking up than normal.  You feel: ? Weak. ? Dizzy. ? Very thirsty.  You have peed (urinated) only a small amount of very dark pee during 6-8 hours. Get help right away if:  You have symptoms of   very bad dehydration.  You cannot drink fluids without throwing up (vomiting).  Your symptoms get worse with treatment.  You have a fever.  You have a very bad headache.  You are throwing up or having watery poop (diarrhea) and it: ? Gets worse. ? Does not go away.  You have blood or something green (bile) in your throw-up.  You have blood in your poop (stool). This may cause poop to look black and tarry.  You have not peed in 6-8 hours.  You pass out (faint).  Your heart rate when you are sitting still is more than 100 beats a  minute.  You have trouble breathing. This information is not intended to replace advice given to you by your health care provider. Make sure you discuss any questions you have with your health care provider. Document Released: 06/27/2009 Document Revised: 03/20/2016 Document Reviewed: 10/25/2015 Elsevier Interactive Patient Education  2018 [employer]  Dehydration, Adult Dehydration is when there is not enough fluid or water in your body. This happens when you lose more fluids than you take in. Dehydration can range from mild to very bad. It should be treated right away to keep it from getting very bad. Symptoms of mild dehydration may include:  Thirst.  Dry lips.  Slightly dry mouth.  Dry, warm skin.  Dizziness. Symptoms of moderate dehydration may include:  Very dry mouth.  Muscle cramps.  Dark pee (urine). Pee may be the color of tea.  Your body making less pee.  Your eyes making fewer tears.  Heartbeat that is uneven or faster than normal (palpitations).  Headache.  Light-headedness, especially when you stand up from sitting.  Fainting (syncope). Symptoms of very bad dehydration may include:  Changes in skin, such as: ? Cold and clammy skin. ? Blotchy (mottled) or pale skin. ? Skin that does not quickly return to normal after being lightly pinched and let go (poor skin turgor).  Changes in body fluids, such as: ? Feeling very thirsty. ? Your eyes making fewer tears. ? Not sweating when body temperature is high, such as in hot weather. ? Your body making very little pee.  Changes in vital signs, such as: ? Weak pulse. ? Pulse that is more than 100 beats a minute when you are sitting still. ? Fast breathing. ? Low blood pressure.  Other changes, such as: ? Sunken eyes. ? Cold hands and feet. ? Confusion. ? Lack of energy (lethargy). ? Trouble waking up from sleep. ? Short-term weight loss. ? Unconsciousness. Follow these instructions at  home:  If told by your doctor, drink an ORS: ? Make an ORS by using instructions on the package. ? Start by drinking small amounts, about  cup (120 mL) every 5-10 minutes. ? Slowly drink more until you have had the amount that your doctor said to have.  Drink enough clear fluid to keep your pee clear or pale yellow. If you were told to drink an ORS, finish the ORS first, then start slowly drinking clear fluids. Drink fluids such as: ? Water. Do not drink only water by itself. Doing that can make the salt (sodium) level in your body get too low (hyponatremia). ? Ice chips. ? Fruit juice that you have added water to (diluted). ? Low-calorie sports drinks.  Avoid: ? Alcohol. ? Drinks that have a lot of sugar. These include high-calorie sports drinks, fruit juice that does not have water added, and soda. ? Caffeine. ? Foods that are greasy or have   a lot of fat or sugar.  Take over-the-counter and prescription medicines only as told by your doctor.  Do not take salt tablets. Doing that can make the salt level in your body get too high (hypernatremia).  Eat foods that have minerals (electrolytes). Examples include bananas, oranges, potatoes, tomatoes, and spinach.  Keep all follow-up visits as told by your doctor. This is important. Contact a doctor if:  You have belly (abdominal) pain that: ? Gets worse. ? Stays in one area (localizes).  You have a rash.  You have a stiff neck.  You get angry or annoyed more easily than normal (irritability).  You are more sleepy than normal.  You have a harder time waking up than normal.  You feel: ? Weak. ? Dizzy. ? Very thirsty.  You have peed (urinated) only a small amount of very dark pee during 6-8 hours. Get help right away if:  You have symptoms of very bad dehydration.  You cannot drink fluids without throwing up (vomiting).  Your symptoms get worse with treatment.  You have a fever.  You have a very bad headache.  You  are throwing up or having watery poop (diarrhea) and it: ? Gets worse. ? Does not go away.  You have blood or something green (bile) in your throw-up.  You have blood in your poop (stool). This may cause poop to look black and tarry.  You have not peed in 6-8 hours.  You pass out (faint).  Your heart rate when you are sitting still is more than 100 beats a minute.  You have trouble breathing. This information is not intended to replace advice given to you by your health care provider. Make sure you discuss any questions you have with your health care provider. Document Released: 06/27/2009 Document Revised: 03/20/2016 Document Reviewed: 10/25/2015 Elsevier Interactive Patient Education  2018 [employer] Inc.  

## 2018-08-29 NOTE — Telephone Encounter (Signed)
Gave patient avs and calendar.   °

## 2018-08-30 ENCOUNTER — Encounter: Payer: Self-pay | Admitting: Hematology and Oncology

## 2018-08-30 NOTE — Assessment & Plan Note (Signed)
She has intermittent pancytopenia but is improving She is not symptomatic Observe only

## 2018-08-30 NOTE — Assessment & Plan Note (Signed)
Her oral intake has improved We will discontinue IV fluid support and recommend increase oral intake and frequent supplement as tolerated

## 2018-08-30 NOTE — Progress Notes (Signed)
[place] OFFICE PROGRESS NOTE  Patient Care Team: Patient, [name] as PCP - General (General Practice)  ASSESSMENT & PLAN:  Malignant neoplasm of cervix [facility]) She has completed chemotherapy and radiation therapy She will be due for repeat imaging study in February I will order PET CT scan and follow-up with GYN oncologist  [facility]) Her oral intake has improved We will discontinue IV fluid support and recommend increase oral intake and frequent supplement as tolerated  Acquired pancytopenia ([place]) She has intermittent pancytopenia but is improving She is not symptomatic Observe only   Orders Placed This Encounter  Procedures  . NM PET Image Restag (PS) Skull Base To Thigh    Standing Status:   Future    Standing Expiration Date:   08/31/2019    Order Specific Question:   If indicated for the ordered procedure, I authorize the administration of a radiopharmaceutical per Radiology protocol    Answer:   Yes    Order Specific Question:   Preferred imaging location?    Answer:   [facility]    Order Specific Question:   Radiology Contrast Protocol - do NOT remove file path    Answer:   [email or web address]    INTERVAL HISTORY: Please see below for problem oriented charting. She returns with her caregiver for further follow-up She is getting better and started to gain weight She denies diarrhea constipation No nausea vomiting No recent infection, fever or chills  SUMMARY OF ONCOLOGIC HISTORY: Oncology History   Cancer Staging Malignant neoplasm of cervix ([place]) Staging form: Cervix Uteri, AJCC 8th Edition - Clinical: Stage IIIB (cT3b, cN1, cM0) - Signed by [name], MD on 05/04/2018       Malignant neoplasm of cervix ([place])   03/13/2018 Imaging    US pelvis There are mobile echogenic foci within the endometrial cavity suggestive of gas. This is nonspecific in etiology however may be secondary to endometritis.  Correlate for history of recent instrumentation. This obscures visualization of the endometrial tissue and therefore a follow-up pelvic ultrasound in 2-4 weeks is recommended after resolution of the acute symptomatology if symptoms persist to further evaluate the endometrium.     03/13/2018 Initial Diagnosis    She initially presented with PMB    04/12/2018 Pathology Results    Cervix, biopsy, mass - INVASIVE SQUAMOUS CELL CARCINOMA - SEE COMMENT    04/12/2018 Surgery    PREOPERATIVE DIAGNOSIS:  Postmenopausal vaginal bleeding. POSTOPERATIVE DIAGNOSIS: The same PROCEDURE: Exam under anesthesia, pap smear, cervical mass biopsy SURGEON:  Dr. [name]  INDICATIONS: 67 y.o. yo [id number] with PMB here for exam under anesthesia.Risks of surgery were discussed with the patient including but not limited to: bleeding which may require transfusion; infection which may require antibiotics; injury to uterus or surrounding organs; need for additional procedures including laparotomy or laparoscopy; and other postoperative/anesthesia complications. Written informed consent was obtained.    FINDINGS:  An 8-week size midline uterus.  No adnexal mass palpable on exam. Normal cervix not visualized. Friable mass seen in involving the vagina at the level of the cervix obliterating the anterior and posterior fornix.  ANESTHESIA:   General INTRAVENOUS FLUIDS:  300 ml of LR ESTIMATED BLOOD LOSS: 20 ml. SPECIMENS: pap smear, cervical mass biopsy COMPLICATIONS:  None immediate.     04/25/2018 PET scan    1. Large cervical mass may invade into the myometrium in down into the vagina, maximum SUV 21.3. There is a malignant left external iliac node  measuring 1.4 cm in short axis with maximum SUV 14.1. 2. Accentuated activity in the cecum and ascending colon is likely physiologic given that it has no CT correlate. Correlation with the patient's colon cancer screening history is recommended. If screening is not  up-to-date, appropriate screening should be considered. 3.  Aortic Atherosclerosis (ICD10-I70.0).     04/28/2018 Surgery    Pre-operative Diagnosis:  At least Stage 2B SCCa Cervical Cancer  Post-operative Diagnosis: At least clinical Stage 3A SCCa Cervical Cancer  Operation:  Exam under anesthesia due to intolerance for vaginal exam in office Cystoscopy due to concern for extension to posterior bladder (from anterior vaginal wall lesion)  Operative Findings:   1. Parametrial involvement on right  2. Right sided subvaginal tumor burden ~3cm. This approximates the right ureteral insertion into the bladder based on my exam during cystoscopy. 3. Extension of disease down upper half of vagina on lateral and posterior walls. 4. Extension of disease down to lower third of anterior vagina (versus skip lesion) thus making her Stage 3A 5. Cystoscopy revealed patent bilateral ureteral orifices and no bladder mucosa invasion or areas of concern. 6. Rectal exam grossly no disease.     05/04/2018 Cancer Staging    Staging form: Cervix Uteri, AJCC 8th Edition - Clinical: Stage IIIB (cT3b, cN1, cM0) - Signed by [name], MD on 05/04/2018    05/12/2018 Procedure    Successful 8 French right internal jugular vein power port placement with its tip at the SVC/RA junction    05/13/2018 -  Chemotherapy    The patient had weekly cisplatin given with concurrent radiation     06/03/2018 Adverse Reaction    She missed her chemo due to severe depression and was sent to the ER for suicidal ideation.  She was seen by psychiatrist.    07/27/2018 -  Radiation Therapy    Brachytherapy for her cervical cancer by Dr. Sondra Come      REVIEW OF SYSTEMS:   Constitutional: Denies fevers, chills or abnormal weight loss Eyes: Denies blurriness of vision Ears, nose, mouth, throat, and face: Denies mucositis or sore throat Respiratory: Denies cough, dyspnea or wheezes Cardiovascular: Denies palpitation, chest  discomfort or lower extremity swelling Gastrointestinal:  Denies nausea, heartburn or change in bowel habits Skin: Denies abnormal skin rashes Lymphatics: Denies new lymphadenopathy or easy bruising Neurological:Denies numbness, tingling or new weaknesses Behavioral/Psych: Mood is stable, no new changes  All other systems were reviewed with the patient and are negative.  I have reviewed the past medical history, past surgical history, social history and family history with the patient and they are unchanged from previous note.  ALLERGIES:  has No Known Allergies.  MEDICATIONS:  No current outpatient medications on file.   No current facility-administered medications for this visit.    Facility-Administered Medications Ordered in Other Visits  Medication Dose Route Frequency Provider Last Rate Last Dose  . sodium chloride 0.9 % 1,000 mL with potassium chloride 20 mEq infusion   Intravenous Continuous [name], MD   Stopped at 06/09/18 1406    PHYSICAL EXAMINATION: ECOG PERFORMANCE STATUS: 0 - Asymptomatic  Vitals:   08/29/18 1117  BP: 130/77  Pulse: 66  Resp: 18  Temp: 98.9 F (37.2 C)  SpO2: 100%   Filed Weights   08/29/18 1117  Weight: 87 lb (39.5 kg)    GENERAL:alert, no distress and comfortable SKIN: skin color, texture, turgor are normal, no rashes or significant lesions EYES: normal, Conjunctiva are pink and non-injected, sclera  clear OROPHARYNX:no exudate, no erythema and lips, buccal mucosa, and tongue normal  NECK: supple, thyroid normal size, non-tender, without nodularity LYMPH:  no palpable lymphadenopathy in the cervical, axillary or inguinal LUNGS: clear to auscultation and percussion with normal breathing effort HEART: regular rate & rhythm and no murmurs and no lower extremity edema ABDOMEN:abdomen soft, non-tender and normal bowel sounds Musculoskeletal:no cyanosis of digits and no clubbing  NEURO: alert & oriented x 3 with fluent speech, no focal  motor/sensory deficits  LABORATORY DATA:  I have reviewed the data as listed    Component Value Date/Time   NA 143 08/24/2018 1345   K 4.0 08/24/2018 1345   CL 106 08/24/2018 1345   CO2 29 08/24/2018 1345   GLUCOSE 83 08/24/2018 1345   BUN 14 08/24/2018 1345   CREATININE 0.98 08/24/2018 1345   CREATININE 0.73 07/18/2018 1512   CALCIUM 8.7 (L) 08/24/2018 1345   PROT 6.4 (L) 08/24/2018 1345   ALBUMIN 3.5 08/24/2018 1345   AST 12 (L) 08/24/2018 1345   AST 14 (L) 07/18/2018 1512   ALT 7 08/24/2018 1345   ALT 12 07/18/2018 1512   ALKPHOS 60 08/24/2018 1345   BILITOT 0.3 08/24/2018 1345   BILITOT 0.3 07/18/2018 1512   GFRNONAA >60 08/24/2018 1345   GFRNONAA >60 07/18/2018 1512   GFRAA >60 08/24/2018 1345   GFRAA >60 07/18/2018 1512    No results found for: SPEP, UPEP  Lab Results  Component Value Date   WBC 5.6 08/24/2018   NEUTROABS 4.3 08/24/2018   HGB 10.4 (L) 08/24/2018   HCT 32.1 (L) 08/24/2018   MCV 99.1 08/24/2018   PLT 189 08/24/2018      Chemistry      Component Value Date/Time   NA 143 08/24/2018 1345   K 4.0 08/24/2018 1345   CL 106 08/24/2018 1345   CO2 29 08/24/2018 1345   BUN 14 08/24/2018 1345   CREATININE 0.98 08/24/2018 1345   CREATININE 0.73 07/18/2018 1512      Component Value Date/Time   CALCIUM 8.7 (L) 08/24/2018 1345   ALKPHOS 60 08/24/2018 1345   AST 12 (L) 08/24/2018 1345   AST 14 (L) 07/18/2018 1512   ALT 7 08/24/2018 1345   ALT 12 07/18/2018 1512   BILITOT 0.3 08/24/2018 1345   BILITOT 0.3 07/18/2018 1512      All questions were answered. The patient knows to call the clinic with any problems, questions or concerns. No barriers to learning was detected.  I spent 15 minutes counseling the patient face to face. The total time spent in the appointment was 20 minutes and more than 50% was on counseling and review of test results  [name], MD 08/30/2018 8:38 AM

## 2018-08-30 NOTE — Assessment & Plan Note (Signed)
She has completed chemotherapy and radiation therapy She will be due for repeat imaging study in February I will order PET CT scan and follow-up with GYN oncologist

## 2018-09-01 ENCOUNTER — Encounter: Payer: Self-pay | Admitting: Radiation Oncology

## 2018-09-01 ENCOUNTER — Telehealth: Payer: Self-pay | Admitting: *Deleted

## 2018-09-01 ENCOUNTER — Other Ambulatory Visit: Payer: Self-pay

## 2018-09-01 ENCOUNTER — Ambulatory Visit
Admission: RE | Admit: 2018-09-01 | Discharge: 2018-09-01 | Disposition: A | Discharge status: Home / Self Care | Payer: Medicaid Other | Source: Ambulatory Visit | Attending: Radiation Oncology | Admitting: Radiation Oncology

## 2018-09-01 VITALS — BP 132/85 | HR 79 | Temp 99.1°F | Resp 18 | Wt 84.6 lb

## 2018-09-01 DIAGNOSIS — C539 Malignant neoplasm of cervix uteri, unspecified: Secondary | ICD-10-CM | POA: Diagnosis not present

## 2018-09-01 DIAGNOSIS — Z923 Personal history of irradiation: Secondary | ICD-10-CM | POA: Diagnosis not present

## 2018-09-01 NOTE — Telephone Encounter (Signed)
Called and scheduled a follow up appt on 2/17 to see Dr. Gerarda Fraction per Dr.Gorsuch

## 2018-09-01 NOTE — Progress Notes (Signed)
Erin Kent presents today for f/u with Dr. Sondra Come. Pt's caregiver, [name], answers most of this RN's questions. Pt reports endurance is improving. Pt denies c/o pain. Pt denies dysuria/hematuria. Pt denies vaginal bleeding/discharge. Pt denies rectal bleeding, diarrhea/constipation. Pt denies N/V.  BP 132/85   Pulse 79   Temp 99.1 F (37.3 C) (Oral)   Resp 18   Wt 84 lb 9.6 oz (38.4 kg)   SpO2 100%   BMI 15.47 kg/m   Wt Readings from Last 3 Encounters:  09/01/18 84 lb 9.6 oz (38.4 kg)  08/29/18 87 lb (39.5 kg)  08/22/18 80 lb 3 oz (36.4 kg)   Loma Sousa, RN BSN

## 2018-09-01 NOTE — Progress Notes (Signed)
Radiation Oncology         [phone] ________________________________  Name: Erin Kent MRN: [id number]  Date: 09/01/2018  DOB: [birth year]  Follow-Up Visit Note  CC: Patient, [name]  Isabel Caprice, MD    ICD-10-CM   1. Malignant neoplasm of cervix, unspecified site [facility]) C53.9     Diagnosis:   Stage IIIB (cT3b, cN1, cM0) malignant neoplasm of cervix  Interval Since Last Radiation: 1 months  Radiation treatment dates:   05/17/2018-07/27/2018  Site/dose: 1. Cervix, 1.8 Gy in 25 fractions for a total dose of 45 Gy                    2. Pelvis  Boost, 1.8 Gy in 5 fractions for a total dose of 9 Gy                    3. Cervix, 5.5 Gy in 5 fractions for a total dose of 27.5 Gy  Narrative:  The patient returns today for routine follow-up.  she is doing well overall. She is accompanied by her caretaker [name] who dictated much of her visit.   On review of systems, she reports healthy appetite and weight gain following radiation. she denies vaginal/rectal bleeding, pain and any other symptoms. Pertinent positives are listed and detailed within the above HPI. As been receiving IV fluids 3 times a week through medical oncology. Patient was seen by Dr. Alvy Bimler  earlier this week and this therapy has been discontinued in light of the patient's improvement  She will have a follow-up PET scan in February.                 ALLERGIES:  has No Known Allergies.  Meds: No current outpatient medications on file.   No current facility-administered medications for this encounter.    Facility-Administered Medications Ordered in Other Encounters  Medication Dose Route Frequency Provider Last Rate Last Dose  . sodium chloride 0.9 % 1,000 mL with potassium chloride 20 mEq infusion   Intravenous Continuous [name], MD   Stopped at 06/09/18 1406    Physical Findings: The patient is in no acute distress. Patient is alert and oriented.  weight is 84 lb 9.6 oz (38.4 kg). Her oral  temperature is 99.1 F (37.3 C). Her blood pressure is 132/85 and her pulse is 79. Her respiration is 18 and oxygen saturation is 100%. .  No significant changes. Lungs are clear to auscultation bilaterally. Heart has regular rate and rhythm. No palpable cervical, supraclavicular, or axillary adenopathy. Abdomen soft, non-tender, normal bowel sounds. Pelvic exam not performed in light of recent treatment completion.   Lab Findings: Lab Results  Component Value Date   WBC 5.6 08/24/2018   HGB 10.4 (L) 08/24/2018   HCT 32.1 (L) 08/24/2018   MCV 99.1 08/24/2018   PLT 189 08/24/2018    Radiographic Findings: No results found.  Impression:  The patient is recovering from the effects of radiation.  She is eating better and denies any pain at this time. Patient was given a vaginal dilator and instructions on its use today.  Plan:  Follow-up in radiation oncology in May. Patient will follow-up with Dr. Gerarda Fraction in February and will have a PET scan at that time.   ____________________________________   Blair Promise, PhD, MD    This document serves as a record of services personally performed by Gery Pray, MD. It was created on his behalf by [facility], a  trained medical scribe. The creation of this record is based on the scribe's personal observations and the provider's statements to them. This document has been checked and approved by the attending provider.

## 2018-10-10 ENCOUNTER — Inpatient Hospital Stay: Payer: Medicaid Other | Attending: Obstetrics

## 2018-10-10 ENCOUNTER — Ambulatory Visit (HOSPITAL_COMMUNITY): Payer: Self-pay | Admitting: Psychiatry

## 2018-10-10 DIAGNOSIS — C539 Malignant neoplasm of cervix uteri, unspecified: Secondary | ICD-10-CM | POA: Diagnosis present

## 2018-10-10 MED ORDER — SODIUM CHLORIDE 0.9% FLUSH
10.0000 mL | Freq: Once | INTRAVENOUS | Status: AC
  Administered 2018-10-10: 10 mL
  Filled 2018-10-10: qty 10

## 2018-10-10 MED ORDER — HEPARIN SOD (PORK) LOCK FLUSH 100 UNIT/ML IV SOLN
500.0000 [IU] | Freq: Once | INTRAVENOUS | Status: AC
  Administered 2018-10-10: 500 [IU]
  Filled 2018-10-10: qty 5

## 2018-10-18 ENCOUNTER — Telehealth: Payer: Self-pay | Admitting: *Deleted

## 2018-10-18 NOTE — Telephone Encounter (Signed)
Called and spoke with [facility], the patient's friend/caregiver moved the appt from 2/17 to 2/25. [name] is aware the appt is going to be with Dr. Berton Lan.

## 2018-10-19 ENCOUNTER — Telehealth: Payer: Self-pay | Admitting: Hematology and Oncology

## 2018-10-19 NOTE — Telephone Encounter (Signed)
Scheduled appt per 2/5 sch message - pt friend is aware of appt date and time

## 2018-10-21 ENCOUNTER — Telehealth: Payer: Self-pay | Admitting: Hematology and Oncology

## 2018-10-21 NOTE — Telephone Encounter (Signed)
Scheduled appt per 2/6 sch message - pt caregiver is aware of appt date and time

## 2018-10-27 ENCOUNTER — Inpatient Hospital Stay: Payer: Medicaid Other | Attending: Obstetrics

## 2018-10-27 ENCOUNTER — Encounter (HOSPITAL_COMMUNITY)
Admission: RE | Admit: 2018-10-27 | Discharge: 2018-10-27 | Disposition: A | Discharge status: Home / Self Care | Payer: Medicaid Other | Source: Ambulatory Visit | Attending: Hematology and Oncology | Admitting: Hematology and Oncology

## 2018-10-27 ENCOUNTER — Inpatient Hospital Stay: Payer: Medicaid Other

## 2018-10-27 DIAGNOSIS — Z9221 Personal history of antineoplastic chemotherapy: Secondary | ICD-10-CM | POA: Insufficient documentation

## 2018-10-27 DIAGNOSIS — C539 Malignant neoplasm of cervix uteri, unspecified: Secondary | ICD-10-CM | POA: Diagnosis not present

## 2018-10-27 DIAGNOSIS — K5730 Diverticulosis of large intestine without perforation or abscess without bleeding: Secondary | ICD-10-CM | POA: Diagnosis not present

## 2018-10-27 DIAGNOSIS — I700 Atherosclerosis of aorta: Secondary | ICD-10-CM | POA: Insufficient documentation

## 2018-10-27 DIAGNOSIS — R64 Cachexia: Secondary | ICD-10-CM | POA: Diagnosis not present

## 2018-10-27 DIAGNOSIS — D61818 Other pancytopenia: Secondary | ICD-10-CM | POA: Insufficient documentation

## 2018-10-27 DIAGNOSIS — M8580 Other specified disorders of bone density and structure, unspecified site: Secondary | ICD-10-CM | POA: Insufficient documentation

## 2018-10-27 DIAGNOSIS — Z923 Personal history of irradiation: Secondary | ICD-10-CM | POA: Insufficient documentation

## 2018-10-27 LAB — CBC WITH DIFFERENTIAL/PLATELET
Abs Immature Granulocytes: 0.08 10*3/uL — ABNORMAL HIGH (ref 0.00–0.07)
BASOS ABS: 0 10*3/uL (ref 0.0–0.1)
Basophils Relative: 1 %
EOS PCT: 2 %
Eosinophils Absolute: 0.1 10*3/uL (ref 0.0–0.5)
HCT: 34.9 % — ABNORMAL LOW (ref 36.0–46.0)
Hemoglobin: 11.2 g/dL — ABNORMAL LOW (ref 12.0–15.0)
Immature Granulocytes: 2 %
Lymphocytes Relative: 11 %
Lymphs Abs: 0.5 10*3/uL — ABNORMAL LOW (ref 0.7–4.0)
MCH: 30.8 pg (ref 26.0–34.0)
MCHC: 32.1 g/dL (ref 30.0–36.0)
MCV: 95.9 fL (ref 80.0–100.0)
Monocytes Absolute: 0.5 10*3/uL (ref 0.1–1.0)
Monocytes Relative: 12 %
Neutro Abs: 3.3 10*3/uL (ref 1.7–7.7)
Neutrophils Relative %: 72 %
Platelets: 209 10*3/uL (ref 150–400)
RBC: 3.64 MIL/uL — ABNORMAL LOW (ref 3.87–5.11)
RDW: 11.5 % (ref 11.5–15.5)
WBC: 4.4 10*3/uL (ref 4.0–10.5)
nRBC: 0 % (ref 0.0–0.2)

## 2018-10-27 LAB — COMPREHENSIVE METABOLIC PANEL
ALT: <6 U/L (ref 0–44)
AST: 13 U/L — ABNORMAL LOW (ref 15–41)
Albumin: 3.5 g/dL (ref 3.5–5.0)
Alkaline Phosphatase: 57 U/L (ref 38–126)
Anion gap: 7 (ref 5–15)
BUN: 11 mg/dL (ref 8–23)
CALCIUM: 8.8 mg/dL — AB (ref 8.9–10.3)
CO2: 28 mmol/L (ref 22–32)
Chloride: 106 mmol/L (ref 98–111)
Creatinine, Ser: 0.76 mg/dL (ref 0.44–1.00)
GFR calc Af Amer: >60 mL/min (ref >60)
Glucose, Bld: 80 mg/dL (ref 70–99)
Potassium: 3.7 mmol/L (ref 3.5–5.1)
Sodium: 141 mmol/L (ref 135–145)
Total Bilirubin: 0.4 mg/dL (ref 0.3–1.2)
Total Protein: 6.7 g/dL (ref 6.5–8.1)

## 2018-10-27 LAB — GLUCOSE, CAPILLARY: Glucose-Capillary: 81 mg/dL (ref 70–99)

## 2018-10-27 MED ORDER — FLUDEOXYGLUCOSE F - 18 (FDG) INJECTION
4.9000 | Freq: Once | INTRAVENOUS | Status: AC
  Administered 2018-10-27: 4.9 via INTRAVENOUS

## 2018-10-31 ENCOUNTER — Inpatient Hospital Stay (HOSPITAL_BASED_OUTPATIENT_CLINIC_OR_DEPARTMENT_OTHER): Payer: Medicaid Other | Admitting: Hematology and Oncology

## 2018-10-31 ENCOUNTER — Ambulatory Visit: Payer: Self-pay | Admitting: Obstetrics

## 2018-10-31 VITALS — BP 124/69 | HR 69 | Temp 98.8°F | Resp 18 | Ht 62.0 in | Wt 86.4 lb

## 2018-10-31 DIAGNOSIS — C539 Malignant neoplasm of cervix uteri, unspecified: Secondary | ICD-10-CM

## 2018-10-31 DIAGNOSIS — R64 Cachexia: Secondary | ICD-10-CM | POA: Diagnosis not present

## 2018-10-31 DIAGNOSIS — D61818 Other pancytopenia: Secondary | ICD-10-CM

## 2018-10-31 DIAGNOSIS — I700 Atherosclerosis of aorta: Secondary | ICD-10-CM | POA: Diagnosis not present

## 2018-10-31 DIAGNOSIS — M8580 Other specified disorders of bone density and structure, unspecified site: Secondary | ICD-10-CM

## 2018-10-31 DIAGNOSIS — K921 Melena: Secondary | ICD-10-CM

## 2018-10-31 DIAGNOSIS — Z9221 Personal history of antineoplastic chemotherapy: Secondary | ICD-10-CM

## 2018-10-31 DIAGNOSIS — K5730 Diverticulosis of large intestine without perforation or abscess without bleeding: Secondary | ICD-10-CM

## 2018-10-31 DIAGNOSIS — Z923 Personal history of irradiation: Secondary | ICD-10-CM

## 2018-11-01 ENCOUNTER — Encounter: Payer: Self-pay | Admitting: Hematology and Oncology

## 2018-11-01 ENCOUNTER — Telehealth: Payer: Self-pay | Admitting: Oncology

## 2018-11-01 ENCOUNTER — Telehealth: Payer: Self-pay | Admitting: Hematology and Oncology

## 2018-11-01 NOTE — Telephone Encounter (Signed)
Erin Kent with appointment to see Dr. Fermin Schwab 02/21/2019 at 10:45 am. She verbalized understanding and agreement.

## 2018-11-01 NOTE — Progress Notes (Signed)
[place] OFFICE PROGRESS NOTE  Patient Care Team: Patient, [name] as PCP - General (General Practice)  ASSESSMENT & PLAN:  Malignant neoplasm of cervix ([place]) I have reviewed multiple PET CT scan with the patient and her caregiver She has complete response of therapy I recommend port removal I will get her scheduled to see GYN surgeon in the future.  Acquired pancytopenia (HCC) Anemia is resolving.  Continue observation only.  I recommend primary care doctor follow-up  Cachexia [facility]) She continues to have cachexia but overall her weight has been stable.  I encouraged her to increase oral intake as tolerated   Orders Placed This Encounter  Procedures  . IR REMOVAL TUN ACCESS W/ PORT W/O FL MOD SED    Standing Status:   Future    Standing Expiration Date:   12/30/2019    Order Specific Question:   Reason for exam:    Answer:   no need port, chemo is complete    Order Specific Question:   Preferred Imaging Location?    Answer:   [facility]    INTERVAL HISTORY: Please see below for problem oriented charting. She returns for further follow-up with her caregiver She feels well No recent changes in bowel habits Denies abnormal vaginal bleeding or discharge She felt fully recovered from side effects of treatment No recent infection, fever or chills  SUMMARY OF ONCOLOGIC HISTORY: Oncology History   Cancer Staging Malignant neoplasm of cervix ([place]) Staging form: Cervix Uteri, AJCC 8th Edition - Clinical: Stage IIIB (cT3b, cN1, cM0) - Signed by [name], MD on 05/04/2018       Malignant neoplasm of cervix ([place])   03/13/2018 Imaging    US pelvis There are mobile echogenic foci within the endometrial cavity suggestive of gas. This is nonspecific in etiology however may be secondary to endometritis. Correlate for history of recent instrumentation. This obscures visualization of the endometrial tissue and therefore a follow-up pelvic ultrasound in  2-4 weeks is recommended after resolution of the acute symptomatology if symptoms persist to further evaluate the endometrium.     03/13/2018 Initial Diagnosis    She initially presented with PMB    04/12/2018 Pathology Results    Cervix, biopsy, mass - INVASIVE SQUAMOUS CELL CARCINOMA - SEE COMMENT    04/12/2018 Surgery    PREOPERATIVE DIAGNOSIS:  Postmenopausal vaginal bleeding. POSTOPERATIVE DIAGNOSIS: The same PROCEDURE: Exam under anesthesia, pap smear, cervical mass biopsy SURGEON:  Dr. [name]  INDICATIONS: 68 y.o. yo G0P0000 with PMB here for exam under anesthesia.Risks of surgery were discussed with the patient including but not limited to: bleeding which may require transfusion; infection which may require antibiotics; injury to uterus or surrounding organs; need for additional procedures including laparotomy or laparoscopy; and other postoperative/anesthesia complications. Written informed consent was obtained.    FINDINGS:  An 8-week size midline uterus.  No adnexal mass palpable on exam. Normal cervix not visualized. Friable mass seen in involving the vagina at the level of the cervix obliterating the anterior and posterior fornix.  ANESTHESIA:   General INTRAVENOUS FLUIDS:  300 ml of LR ESTIMATED BLOOD LOSS: 20 ml. SPECIMENS: pap smear, cervical mass biopsy COMPLICATIONS:  None immediate.     04/25/2018 PET scan    1. Large cervical mass may invade into the myometrium in down into the vagina, maximum SUV 21.3. There is a malignant left external iliac node measuring 1.4 cm in short axis with maximum SUV 14.1. 2. Accentuated activity in  the cecum and ascending colon is likely physiologic given that it has no CT correlate. Correlation with the patient's colon cancer screening history is recommended. If screening is not up-to-date, appropriate screening should be considered. 3.  Aortic Atherosclerosis (ICD10-I70.0).     04/28/2018 Surgery    Pre-operative  Diagnosis:  At least Stage 2B SCCa Cervical Cancer  Post-operative Diagnosis: At least clinical Stage 3A SCCa Cervical Cancer  Operation:  Exam under anesthesia due to intolerance for vaginal exam in office Cystoscopy due to concern for extension to posterior bladder (from anterior vaginal wall lesion)  Operative Findings:   1. Parametrial involvement on right  2. Right sided subvaginal tumor burden ~3cm. This approximates the right ureteral insertion into the bladder based on my exam during cystoscopy. 3. Extension of disease down upper half of vagina on lateral and posterior walls. 4. Extension of disease down to lower third of anterior vagina (versus skip lesion) thus making her Stage 3A 5. Cystoscopy revealed patent bilateral ureteral orifices and no bladder mucosa invasion or areas of concern. 6. Rectal exam grossly no disease.     05/04/2018 Cancer Staging    Staging form: Cervix Uteri, AJCC 8th Edition - Clinical: Stage IIIB (cT3b, cN1, cM0) - Signed by [name], MD on 05/04/2018    05/12/2018 Procedure    Successful 8 French right internal jugular vein power port placement with its tip at the SVC/RA junction    05/13/2018 - 06/24/2018 Chemotherapy    The patient had weekly cisplatin given with concurrent radiation     05/17/2018 - 07/27/2018 Radiation Therapy    Radiation treatment dates:05/17/2018-07/27/2018  Site/dose:1. Cervix, 1.8 Gy in 25 fractions for a total dose of 45 Gy 2. Pelvis  Boost, 1.8 Gy in 5 fractions for a total dose of 9 Gy 3. Cervix,5.5Gy in 5 fractions for a total dose of 27.5Gy    06/03/2018 Adverse Reaction    She missed her chemo due to severe depression and was sent to the ER for suicidal ideation.  She was seen by psychiatrist.    10/27/2018 PET scan    IMPRESSION: 1. Complete metabolic response to therapy, without residual or recurrent hypermetabolic disease. 2.  Aortic Atherosclerosis  (ICD10-I70.0).      REVIEW OF SYSTEMS:   Constitutional: Denies fevers, chills or abnormal weight loss Eyes: Denies blurriness of vision Ears, nose, mouth, throat, and face: Denies mucositis or sore throat Respiratory: Denies cough, dyspnea or wheezes Cardiovascular: Denies palpitation, chest discomfort or lower extremity swelling Gastrointestinal:  Denies nausea, heartburn or change in bowel habits Skin: Denies abnormal skin rashes Lymphatics: Denies new lymphadenopathy or easy bruising Neurological:Denies numbness, tingling or new weaknesses Behavioral/Psych: Mood is stable, no new changes  All other systems were reviewed with the patient and are negative.  I have reviewed the past medical history, past surgical history, social history and family history with the patient and they are unchanged from previous note.  ALLERGIES:  has No Known Allergies.  MEDICATIONS:  No current outpatient medications on file.   No current facility-administered medications for this visit.     PHYSICAL EXAMINATION: ECOG PERFORMANCE STATUS: 1 - Symptomatic but completely ambulatory  Vitals:   10/31/18 1253  BP: 124/69  Pulse: 69  Resp: 18  Temp: 98.8 F (37.1 C)  SpO2: 100%   Filed Weights   10/31/18 1253  Weight: 86 lb 6.4 oz (39.2 kg)    GENERAL:alert, no distress and comfortable.  She looks thin and cachectic SKIN: skin color, texture,  turgor are normal, no rashes or significant lesions EYES: normal, Conjunctiva are pink and non-injected, sclera clear OROPHARYNX:no exudate, no erythema and lips, buccal mucosa, and tongue normal  NECK: supple, thyroid normal size, non-tender, without nodularity LYMPH:  no palpable lymphadenopathy in the cervical, axillary or inguinal LUNGS: clear to auscultation and percussion with normal breathing effort HEART: regular rate & rhythm and no murmurs and no lower extremity edema ABDOMEN:abdomen soft, non-tender and normal bowel  sounds Musculoskeletal:no cyanosis of digits and no clubbing  NEURO: alert & oriented x 3 with fluent speech, no focal motor/sensory deficits  LABORATORY DATA:  I have reviewed the data as listed    Component Value Date/Time   NA 141 10/27/2018 0841   K 3.7 10/27/2018 0841   CL 106 10/27/2018 0841   CO2 28 10/27/2018 0841   GLUCOSE 80 10/27/2018 0841   BUN 11 10/27/2018 0841   CREATININE 0.76 10/27/2018 0841   CREATININE 0.73 07/18/2018 1512   CALCIUM 8.8 (L) 10/27/2018 0841   PROT 6.7 10/27/2018 0841   ALBUMIN 3.5 10/27/2018 0841   AST 13 (L) 10/27/2018 0841   AST 14 (L) 07/18/2018 1512   ALT <6 10/27/2018 0841   ALT 12 07/18/2018 1512   ALKPHOS 57 10/27/2018 0841   BILITOT 0.4 10/27/2018 0841   BILITOT 0.3 07/18/2018 1512   GFRNONAA >60 10/27/2018 0841   GFRNONAA >60 07/18/2018 1512   GFRAA >60 10/27/2018 0841   GFRAA >60 07/18/2018 1512    No results found for: SPEP, UPEP  Lab Results  Component Value Date   WBC 4.4 10/27/2018   NEUTROABS 3.3 10/27/2018   HGB 11.2 (L) 10/27/2018   HCT 34.9 (L) 10/27/2018   MCV 95.9 10/27/2018   PLT 209 10/27/2018      Chemistry      Component Value Date/Time   NA 141 10/27/2018 0841   K 3.7 10/27/2018 0841   CL 106 10/27/2018 0841   CO2 28 10/27/2018 0841   BUN 11 10/27/2018 0841   CREATININE 0.76 10/27/2018 0841   CREATININE 0.73 07/18/2018 1512      Component Value Date/Time   CALCIUM 8.8 (L) 10/27/2018 0841   ALKPHOS 57 10/27/2018 0841   AST 13 (L) 10/27/2018 0841   AST 14 (L) 07/18/2018 1512   ALT <6 10/27/2018 0841   ALT 12 07/18/2018 1512   BILITOT 0.4 10/27/2018 0841   BILITOT 0.3 07/18/2018 1512       RADIOGRAPHIC STUDIES: I have reviewed multiple imaging studies with the patient and her caregiver I have personally reviewed the radiological images as listed and agreed with the findings in the report. Nm Pet Image Restag (ps) Skull Base To Thigh  Result Date: 10/27/2018 CLINICAL DATA:  Subsequent  treatment strategy for restaging of cervical cancer. EXAM: NUCLEAR MEDICINE PET SKULL BASE TO THIGH TECHNIQUE: 4.9 mCi F-18 FDG was injected intravenously. Full-ring PET imaging was performed from the skull base to thigh after the radiotracer. CT data was obtained and used for attenuation correction and anatomic localization. Fasting blood glucose: 81 mg/dl COMPARISON:  04/25/2018 FINDINGS: Mediastinal blood pool activity: SUV max 2.1 NECK: No areas of abnormal hypermetabolism. Incidental CT findings: No cervical adenopathy. CHEST: No pulmonary parenchymal or thoracic nodal hypermetabolism. Incidental CT findings: Right Port-A-Cath tip at high right atrium. Mild cardiomegaly with trace, likely physiologic pericardial fluid. Left base scarring. ABDOMEN/PELVIS: No abdominopelvic parenchymal or nodal hypermetabolism. Resolution of previously described cervical and left pelvic sidewall nodal hypermetabolism. Incidental CT findings: Normal adrenal glands.  Abdominal aortic atherosclerosis. Extensive colonic diverticulosis. SKELETON: Degenerative hypermetabolism about the right L4-5 intra-articular facet. No suspicious marrow hypermetabolism. Incidental CT findings: Tarlov cysts.  Mild osteopenia. IMPRESSION: 1. Complete metabolic response to therapy, without residual or recurrent hypermetabolic disease. 2.  Aortic Atherosclerosis (ICD10-I70.0). Electronically Signed   By: Abigail Miyamoto M.D.   On: 10/27/2018 12:16    All questions were answered. The patient knows to call the clinic with any problems, questions or concerns. No barriers to learning was detected.  I spent 15 minutes counseling the patient face to face. The total time spent in the appointment was 20 minutes and more than 50% was on counseling and review of test results  [name], MD 11/01/2018 8:27 AM

## 2018-11-01 NOTE — Assessment & Plan Note (Signed)
She continues to have cachexia but overall her weight has been stable.  I encouraged her to increase oral intake as tolerated

## 2018-11-01 NOTE — Assessment & Plan Note (Signed)
Anemia is resolving.  Continue observation only.  I recommend primary care doctor follow-up

## 2018-11-01 NOTE — Assessment & Plan Note (Signed)
I have reviewed multiple PET CT scan with the patient and her caregiver She has complete response of therapy I recommend port removal I will get her scheduled to see GYN surgeon in the future.

## 2018-11-01 NOTE — Telephone Encounter (Signed)
No los °

## 2018-11-08 ENCOUNTER — Ambulatory Visit: Payer: Self-pay | Admitting: Gynecology

## 2018-11-16 ENCOUNTER — Other Ambulatory Visit: Payer: Self-pay | Admitting: Radiology

## 2018-11-17 ENCOUNTER — Other Ambulatory Visit: Payer: Self-pay

## 2018-11-17 ENCOUNTER — Encounter (HOSPITAL_COMMUNITY): Payer: Self-pay

## 2018-11-17 ENCOUNTER — Ambulatory Visit (HOSPITAL_COMMUNITY)
Admission: RE | Admit: 2018-11-17 | Discharge: 2018-11-17 | Disposition: A | Discharge status: Home / Self Care | Payer: Medicaid Other | Source: Ambulatory Visit | Attending: Hematology and Oncology | Admitting: Hematology and Oncology

## 2018-11-17 DIAGNOSIS — I700 Atherosclerosis of aorta: Secondary | ICD-10-CM | POA: Diagnosis not present

## 2018-11-17 DIAGNOSIS — D61818 Other pancytopenia: Secondary | ICD-10-CM | POA: Diagnosis not present

## 2018-11-17 DIAGNOSIS — Z452 Encounter for adjustment and management of vascular access device: Secondary | ICD-10-CM | POA: Diagnosis not present

## 2018-11-17 DIAGNOSIS — D509 Iron deficiency anemia, unspecified: Secondary | ICD-10-CM | POA: Diagnosis not present

## 2018-11-17 DIAGNOSIS — Z9221 Personal history of antineoplastic chemotherapy: Secondary | ICD-10-CM | POA: Insufficient documentation

## 2018-11-17 DIAGNOSIS — C539 Malignant neoplasm of cervix uteri, unspecified: Secondary | ICD-10-CM

## 2018-11-17 DIAGNOSIS — Z8541 Personal history of malignant neoplasm of cervix uteri: Secondary | ICD-10-CM | POA: Insufficient documentation

## 2018-11-17 DIAGNOSIS — Z923 Personal history of irradiation: Secondary | ICD-10-CM | POA: Insufficient documentation

## 2018-11-17 DIAGNOSIS — F329 Major depressive disorder, single episode, unspecified: Secondary | ICD-10-CM | POA: Insufficient documentation

## 2018-11-17 HISTORY — PX: IR REMOVAL TUN ACCESS W/ PORT W/O FL MOD SED: IMG2290

## 2018-11-17 LAB — CBC WITH DIFFERENTIAL/PLATELET
Abs Immature Granulocytes: 0.04 10*3/uL (ref 0.00–0.07)
BASOS PCT: 1 %
Basophils Absolute: 0 10*3/uL (ref 0.0–0.1)
Eosinophils Absolute: 0.1 10*3/uL (ref 0.0–0.5)
Eosinophils Relative: 2 %
HCT: 39.8 % (ref 36.0–46.0)
Hemoglobin: 12.4 g/dL (ref 12.0–15.0)
Immature Granulocytes: 1 %
Lymphocytes Relative: 12 %
Lymphs Abs: 0.5 10*3/uL — ABNORMAL LOW (ref 0.7–4.0)
MCH: 30.9 pg (ref 26.0–34.0)
MCHC: 31.2 g/dL (ref 30.0–36.0)
MCV: 99.3 fL (ref 80.0–100.0)
Monocytes Absolute: 0.5 10*3/uL (ref 0.1–1.0)
Monocytes Relative: 12 %
NEUTROS ABS: 2.9 10*3/uL (ref 1.7–7.7)
Neutrophils Relative %: 72 %
PLATELETS: 188 10*3/uL (ref 150–400)
RBC: 4.01 MIL/uL (ref 3.87–5.11)
RDW: 11.9 % (ref 11.5–15.5)
WBC: 4.1 10*3/uL (ref 4.0–10.5)
nRBC: 0 % (ref 0.0–0.2)

## 2018-11-17 LAB — PROTIME-INR
INR: 0.9 (ref 0.8–1.2)
Prothrombin Time: 12 seconds (ref 11.4–15.2)

## 2018-11-17 MED ORDER — SODIUM CHLORIDE 0.9 % IV SOLN
INTRAVENOUS | Status: DC
  Administered 2018-11-17: 13:00:00 via INTRAVENOUS

## 2018-11-17 MED ORDER — LIDOCAINE-EPINEPHRINE (PF) 2 %-1:200000 IJ SOLN
INTRAMUSCULAR | Status: AC
  Filled 2018-11-17: qty 20

## 2018-11-17 MED ORDER — MIDAZOLAM HCL 2 MG/2ML IJ SOLN
INTRAMUSCULAR | Status: AC | PRN
  Administered 2018-11-17: 1 mg via INTRAVENOUS

## 2018-11-17 MED ORDER — LIDOCAINE HCL 1 % IJ SOLN
INTRAMUSCULAR | Status: AC | PRN
  Administered 2018-11-17: 10 mL

## 2018-11-17 MED ORDER — FENTANYL CITRATE (PF) 100 MCG/2ML IJ SOLN
INTRAMUSCULAR | Status: AC | PRN
  Administered 2018-11-17: 50 ug via INTRAVENOUS

## 2018-11-17 MED ORDER — FENTANYL CITRATE (PF) 100 MCG/2ML IJ SOLN
INTRAMUSCULAR | Status: AC
  Filled 2018-11-17: qty 2

## 2018-11-17 MED ORDER — MIDAZOLAM HCL 2 MG/2ML IJ SOLN
INTRAMUSCULAR | Status: AC
  Filled 2018-11-17: qty 2

## 2018-11-17 MED ORDER — CEFAZOLIN SODIUM-DEXTROSE 2-4 GM/100ML-% IV SOLN
INTRAVENOUS | Status: AC
  Administered 2018-11-17: 2 g via INTRAVENOUS
  Filled 2018-11-17: qty 100

## 2018-11-17 MED ORDER — CEFAZOLIN SODIUM-DEXTROSE 2-4 GM/100ML-% IV SOLN
2.0000 g | INTRAVENOUS | Status: AC
  Administered 2018-11-17: 2 g via INTRAVENOUS

## 2018-11-17 NOTE — Discharge Instructions (Signed)
Moderate Conscious Sedation, Adult, Care After °These instructions provide you with information about caring for yourself after your procedure. Your health care provider may also give you more specific instructions. Your treatment has been planned according to current medical practices, but problems sometimes occur. Call your health care provider if you have any problems or questions after your procedure. °What can I expect after the procedure? °After your procedure, it is common: °· To feel sleepy for several hours. °· To feel clumsy and have poor balance for several hours. °· To have poor judgment for several hours. °· To vomit if you eat too soon. °Follow these instructions at home: °For at least 24 hours after the procedure: ° °· Do not: °? Participate in activities where you could fall or become injured. °? Drive. °? Use heavy machinery. °? Drink alcohol. °? Take sleeping pills or medicines that cause drowsiness. °? Make important decisions or sign legal documents. °? Take care of children on your own. °· Rest. °Eating and drinking °· Follow the diet recommended by your health care provider. °· If you vomit: °? Drink water, juice, or soup when you can drink without vomiting. °? Make sure you have little or no nausea before eating solid foods. °General instructions °· Have a responsible adult stay with you until you are awake and alert. °· Take over-the-counter and prescription medicines only as told by your health care provider. °· If you smoke, do not smoke without supervision. °· Keep all follow-up visits as told by your health care provider. This is important. °Contact a health care provider if: °· You keep feeling nauseous or you keep vomiting. °· You feel light-headed. °· You develop a rash. °· You have a fever. °Get help right away if: °· You have trouble breathing. °This information is not intended to replace advice given to you by your health care provider. Make sure you discuss any questions you have  with your health care provider. °Document Released: 06/21/2013 Document Revised: 02/03/2016 Document Reviewed: 12/21/2015 °Elsevier Interactive Patient Education © 2019 [employer] ° ° °Implanted Port Removal, Care After °This sheet gives you information about how to care for yourself after your procedure. Your health care provider may also give you more specific instructions. If you have problems or questions, contact your health care provider. °What can I expect after the procedure? °After the procedure, it is common to have: °· Soreness or pain near your incision. °· Some swelling or bruising near your incision. °Follow these instructions at home: °Medicines °· Take over-the-counter and prescription medicines only as told by your health care provider. °· If you were prescribed an antibiotic medicine, take it as told by your health care provider. Do not stop taking the antibiotic even if you start to feel better. °Bathing °· Do not take baths, swim, or use a hot tub until your health care provider approves. Ask your health care provider if you can take showers. You may only be allowed to take sponge baths. °Incision care ° °· Follow instructions from your health care provider about how to take care of your incision. Make sure you: °? Wash your hands with soap and water before you change your bandage (dressing). If soap and water are not available, use hand sanitizer. °? Change your dressing as told by your health care provider. °? Keep your dressing dry. °? Leave stitches (sutures), skin glue, or adhesive strips in place. These skin closures may need to stay in place for 2 weeks or longer. If   adhesive strip edges start to loosen and curl up, you may trim the loose edges. Do not remove adhesive strips completely unless your health care provider tells you to do that. °· Check your incision area every day for signs of infection. Check for: °? More redness, swelling, or pain. °? More fluid or  blood. °? Warmth. °? Pus or a bad smell. °Driving ° °· Do not drive for 24 hours if you were given a medicine to help you relax (sedative) during your procedure. °· If you did not receive a sedative, ask your health care provider when it is safe to drive. °Activity °· Return to your normal activities as told by your health care provider. Ask your health care provider what activities are safe for you. °· Do not lift anything that is heavier than 10 lb (4.5 kg), or the limit that you are told, until your health care provider says that it is safe. °· Do not do activities that involve lifting your arms over your head. °General instructions °· Do not use any products that contain nicotine or tobacco, such as cigarettes and e-cigarettes. These can delay healing. If you need help quitting, ask your health care provider. °· Keep all follow-up visits as told by your health care provider. This is important. °Contact a health care provider if: °· You have more redness, swelling, or pain around your incision. °· You have more fluid or blood coming from your incision. °· Your incision feels warm to the touch. °· You have pus or a bad smell coming from your incision. °· You have pain that is not relieved by your pain medicine. °Get help right away if you have: °· A fever or chills. °· Chest pain. °· Difficulty breathing. °Summary °· After the procedure, it is common to have pain, soreness, swelling, or bruising near your incision. °· If you were prescribed an antibiotic medicine, take it as told by your health care provider. Do not stop taking the antibiotic even if you start to feel better. °· Do not drive for 24 hours if you were given a sedative during your procedure. °· Return to your normal activities as told by your health care provider. Ask your health care provider what activities are safe for you. °This information is not intended to replace advice given to you by your health care provider. Make sure you discuss any  questions you have with your health care provider. °Document Released: 08/12/2015 Document Revised: 10/14/2017 Document Reviewed: 10/14/2017 °Elsevier Interactive Patient Education © 2019 [employer] ° °

## 2018-11-17 NOTE — Procedures (Signed)
Interventional Radiology Procedure:   Indications: Port no longer needed.  Procedure: Port removal  Findings: Complete removal of port  Complications: None     EBL: Minimal   Plan: Discharge to home in 1 hour.    Neftaly Swiss R. Anselm Pancoast, MD  Pager: [phone]

## 2018-11-17 NOTE — H&P (Signed)
Chief Complaint: Patient was seen in consultation today for Port-a-cath removal.  Referring Physician(s): [name]  Supervising Physician: Markus Daft  Patient Status: [facility] - Out-pt  History of Present Illness: Erin Kent is a 68 y.o. female with a past medical history of cervical cancer, iron deficiency anemia, pancytopenia, hypomagnesemia, hypokalemia, osteopenia, and major depressive disorder. She was unfortunately diagnosed with cervical cancer in 03/2018. Her cancer has been managed by Dr. Alvy Bimler. She had a Port-a-cath placed in IR 05/12/2018 by Dr. Barbie Banner. She has completed one round of chemotherapy and one round of radiation therapy. Her most recent PET 10/27/2018 revealed complete metabolic response to therapy without residual/recurrent hypermetabolic disease.  IR requested by Dr. Alvy Bimler for possible Port-a-cath removal as it is no longer needed. Patient awake and alert laying in bed with no complaints at this time. Denies fever, chills, chest pain, dyspnea, abdominal pain, dizziness, or headache.   Past Medical History:  Diagnosis Date  . Aortic atherosclerosis ([place])   . Diarrhea    chronic secondary to radiation  . History of cancer chemotherapy 05-13-2018 to 06-24-2018  . History of external beam radiation therapy    completed 06-24-2018  cervical cancer---  followed with   . Hypokalemia   . Hypomagnesemia   . Iron deficiency anemia   . Malignant neoplasm cervix [facility]) oncologist-- dr Clemon Chambers   dx 04-12-2018---  invasive SCC of cervix,  Stage IIIB1, (cT3b,cN1,cM0)-- completed chemotherapy and external beam radiation on 10-11-219--  started high dose radiation 06-22-2018 for 5 treatments  . MDD (major depressive disorder)    w/ psychosis hallucination episode 06-10-2018  . Osteopenia   . Ovarian cyst    Left  . Pancytopenia, acquired ([place])   . Poor appetite   . Port-A-Cath in place     Past Surgical History:  Procedure Laterality Date  . CYSTOSCOPY N/A  04/28/2018   Procedure: CYSTOSCOPY;  Surgeon: Isabel Caprice, MD;  Location: [facility];  Service: Gynecology;  Laterality: N/A;  . EUA/ PAPSMEAR/  CERVICAL MASS BIOPSY  04-12-2018   dr peggy constant @[facility]   . IR IMAGING GUIDED PORT INSERTION  05/12/2018  . TANDEM RING INSERTION N/A 06/27/2018   Procedure: TANDEM RING INSERTION;  Surgeon: Gery Pray, MD;  [facility];  Service: Urology;  Laterality: N/A;  . TANDEM RING INSERTION N/A 07/04/2018   Procedure: TANDEM RING INSERTION;  Surgeon: Gery Pray, MD;  Location: [facility];  Service: Urology;  Laterality: N/A;  . TANDEM RING INSERTION N/A 07/14/2018   Procedure: TANDEM RING INSERTION;  Surgeon: Gery Pray, MD;  Location: [facility];  Service: Urology;  Laterality: N/A;  . TANDEM RING INSERTION N/A 07/18/2018   Procedure: TANDEM CYLINDER INSERTION;  Surgeon: Gery Pray, MD;  Location: [facility];  Service: Urology;  Laterality: N/A;  . TANDEM RING INSERTION N/A 07/27/2018   Procedure: TANDEM RING INSERTION;  Surgeon: Gery Pray, MD;  Location: [facility];  Service: Urology;  Laterality: N/A;  . WISDOM TOOTH EXTRACTION      Allergies: Patient has no known allergies.  Medications: Prior to Admission medications   Not on File     Family History  Problem Relation Age of Onset  . Hypertension Mother     Social History   Socioeconomic History  . Marital status: Single    Spouse name: Not on file  . Number of children: 0  . Years of education: Not on file  . Highest  education level: Not on file  Occupational History  . Not on file  Social Needs  . Financial resource strain: Not on file  . Food insecurity:    Worry: Not on file    Inability: Not on file  . Transportation needs:    Medical: Not on file    Non-medical: Not on file  Tobacco Use  . Smoking status: Never Smoker  . Smokeless tobacco: Never Used    Substance and Sexual Activity  . Alcohol use: No  . Drug use: No  . Sexual activity: Not Currently    Birth control/protection: None, Post-menopausal  Lifestyle  . Physical activity:    Days per week: Not on file    Minutes per session: Not on file  . Stress: Not on file  Relationships  . Social connections:    Talks on phone: Not on file    Gets together: Not on file    Attends religious service: Not on file    Active member of club or organization: Not on file    Attends meetings of clubs or organizations: Not on file    Relationship status: Not on file  Other Topics Concern  . Not on file  Social History Narrative  . Not on file     Review of Systems: A 12 point ROS discussed and pertinent positives are indicated in the HPI above.  All other systems are negative.  Review of Systems  Constitutional: Negative for chills and fever.  Respiratory: Negative for shortness of breath and wheezing.   Cardiovascular: Negative for chest pain and palpitations.  Gastrointestinal: Negative for abdominal pain.  Neurological: Negative for dizziness and headaches.  Psychiatric/Behavioral: Negative for behavioral problems and confusion.    Vital Signs: BP (!) 142/90 (BP Location: Left Arm)   Pulse 74   Temp 98.7 F (37.1 C) (Oral)   Resp 18   SpO2 100%   Physical Exam Vitals signs and nursing note reviewed.  Constitutional:      General: She is not in acute distress.    Appearance: Normal appearance.  Cardiovascular:     Rate and Rhythm: Normal rate and regular rhythm.     Heart sounds: Normal heart sounds. No murmur.  Pulmonary:     Effort: Pulmonary effort is normal. No respiratory distress.     Breath sounds: Normal breath sounds. No wheezing.  Skin:    General: Skin is warm and dry.  Neurological:     Mental Status: She is alert and oriented to person, place, and time.  Psychiatric:        Mood and Affect: Mood normal.        Behavior: Behavior normal.         Thought Content: Thought content normal.        Judgment: Judgment normal.      MD Evaluation Airway: WNL Heart: WNL Abdomen: WNL Chest/ Lungs: WNL ASA  Classification: 3 Mallampati/Airway Score: Two   Imaging: Nm Pet Image Restag (ps) Skull Base To Thigh  Result Date: 10/27/2018 CLINICAL DATA:  Subsequent treatment strategy for restaging of cervical cancer. EXAM: NUCLEAR MEDICINE PET SKULL BASE TO THIGH TECHNIQUE: 4.9 mCi F-18 FDG was injected intravenously. Full-ring PET imaging was performed from the skull base to thigh after the radiotracer. CT data was obtained and used for attenuation correction and anatomic localization. Fasting blood glucose: 81 mg/dl COMPARISON:  04/25/2018 FINDINGS: Mediastinal blood pool activity: SUV max 2.1 NECK: No areas of abnormal hypermetabolism. Incidental CT findings: No  cervical adenopathy. CHEST: No pulmonary parenchymal or thoracic nodal hypermetabolism. Incidental CT findings: Right Port-A-Cath tip at high right atrium. Mild cardiomegaly with trace, likely physiologic pericardial fluid. Left base scarring. ABDOMEN/PELVIS: No abdominopelvic parenchymal or nodal hypermetabolism. Resolution of previously described cervical and left pelvic sidewall nodal hypermetabolism. Incidental CT findings: Normal adrenal glands. Abdominal aortic atherosclerosis. Extensive colonic diverticulosis. SKELETON: Degenerative hypermetabolism about the right L4-5 intra-articular facet. No suspicious marrow hypermetabolism. Incidental CT findings: Tarlov cysts.  Mild osteopenia. IMPRESSION: 1. Complete metabolic response to therapy, without residual or recurrent hypermetabolic disease. 2.  Aortic Atherosclerosis (ICD10-I70.0). Electronically Signed   By: Abigail Miyamoto M.D.   On: 10/27/2018 12:16    Labs:  CBC: Recent Labs    08/22/18 1410 08/24/18 1345 10/27/18 0841 11/17/18 1315  WBC 5.0 5.6 4.4 4.1  HGB 10.2* 10.4* 11.2* 12.4  HCT 31.2* 32.1* 34.9* 39.8  PLT 176 189  209 188    COAGS: Recent Labs    03/13/18 1125 05/12/18 1130  INR 0.91 0.88    BMP: Recent Labs    08/19/18 1247 08/22/18 1410 08/24/18 1345 10/27/18 0841  NA 143 143 143 141  K 3.3* 3.4* 4.0 3.7  CL 105 105 106 106  CO2 25 31 29 28   GLUCOSE 162* 85 83 80  BUN 12 12 14 11   CALCIUM 8.6* 9.1 8.7* 8.8*  CREATININE 0.79 0.74 0.98 0.76  GFRNONAA >60 >60 >60 >60  GFRAA >60 >60 >60 >60    LIVER FUNCTION TESTS: Recent Labs    08/19/18 1247 08/22/18 1410 08/24/18 1345 10/27/18 0841  BILITOT 0.2* 0.3 0.3 0.4  AST 9* 11* 12* 13*  ALT 6 7 7  <6  ALKPHOS 59 58 60 57  PROT 6.1* 6.0* 6.4* 6.7  ALBUMIN 3.4* 3.4* 3.5 3.5     Assessment and Plan:  Cervical cancer. Plan for Port-a-cath removal today with Dr. Anselm Pancoast. Patient is NPO. Afebrile and WBCs WNL. She does not take blood thinners. INR pending.  Risks and benefits of port-a-catheter removal was discussed with the patient including, but not limited to bleeding, infection, or need for additional procedures. All of the patient's questions were answered, patient is agreeable to proceed. Consent signed and in chart.   Thank you for this interesting consult.  I greatly enjoyed meeting Fransheska Willingham Shehadeh and look forward to participating in their care.  A copy of this report was sent to the requesting provider on this date.  Electronically Signed: Earley Abide, PA-C 11/17/2018, 1:49 PM   I spent a total of 25 Minutes in face to face in clinical consultation, greater than 50% of which was counseling/coordinating care for cervical cancer.

## 2018-12-08 ENCOUNTER — Other Ambulatory Visit: Payer: Self-pay

## 2018-12-08 ENCOUNTER — Ambulatory Visit
Admission: RE | Admit: 2018-12-08 | Discharge: 2018-12-08 | Disposition: A | Discharge status: Home / Self Care | Payer: Medicaid Other | Source: Ambulatory Visit | Attending: Radiation Oncology | Admitting: Radiation Oncology

## 2018-12-08 ENCOUNTER — Encounter: Payer: Self-pay | Admitting: Radiation Oncology

## 2018-12-08 VITALS — BP 124/77 | HR 73 | Temp 98.4°F | Resp 16 | Ht 62.0 in | Wt 85.1 lb

## 2018-12-08 DIAGNOSIS — Z923 Personal history of irradiation: Secondary | ICD-10-CM | POA: Insufficient documentation

## 2018-12-08 DIAGNOSIS — Z8541 Personal history of malignant neoplasm of cervix uteri: Secondary | ICD-10-CM | POA: Diagnosis present

## 2018-12-08 DIAGNOSIS — C539 Malignant neoplasm of cervix uteri, unspecified: Secondary | ICD-10-CM

## 2018-12-08 NOTE — Patient Instructions (Addendum)
Home Care Instructions for the Insertion and Care of Your Vaginal Dilator  Why Do I Need a Vaginal Dilator?  Internal radiation therapy may cause scar tissue to form at the top of your vagina (vaginal cuff).  This may make vaginal examinations difficult in the future. You can prevent scar tissue from forming by using a vaginal dilator (a smooth plastic rod), and/or by having regular sexual intercourse.  If not using the dilator you should be having intercourse two or three times a week.  If you are unable to have intercourse, you should use your vaginal dilator.  You may have some spotting or bleeding from your dilator or intercourse the first few times. You may also have some discomfort. If discomfort occurs with intercourse, you and your partner may need to stop for a while and try again later.  How to Use Your Vaginal Dilator  - Wash the dilator with soap and water before and after each use. - Check the dilator to be sure it is smooth. Do not use the dilator if you find any roughspots. - Coat the dilator with K-Y Jelly, Astroglide, or Replens. Do not use Vaseline, baby oil, or other oil based lubricants. They are not water-soluble and can be irritating to the tissues in the vagina. - Lie on your back with your knees bent and legs apart. - Insert the rounded end of the dilator into your vagina as far as it will go without causing pain or discomfort. - Close your knees and slowly straighten your legs. - Keep the dilator in your vagina for about 10 to 15 minutes.  Please use 3 times a week, for example: Monday, Wednesday and Friday evenings. - Bend your knees, open your legs, and gently remove the dilator. - Gently cleanse the skin around the vaginal opening. - Wash the dilator after each use. -  It is important that you use the dilator routinely until instructed otherwise by your doctor.   Coronavirus (COVID-19) Are you at risk?  Are you at risk for the Coronavirus  (COVID-19)?  To be considered HIGH RISK for Coronavirus (COVID-19), you have to meet the following criteria:  . Traveled to [place], [place], [place], [place] or [place]; or in the [place] to [place], [place], [place], or [place]; and have fever, cough, and shortness of breath within the last 2 weeks of travel OR . Been in close contact with a person diagnosed with COVID-19 within the last 2 weeks and have fever, cough, and shortness of breath . IF YOU DO NOT MEET THESE CRITERIA, YOU ARE CONSIDERED LOW RISK FOR COVID-19.  What to do if you are HIGH RISK for COVID-19?  . If you are having a medical emergency, call 911. . Seek medical care right away. Before you go to a doctor's office, urgent care or emergency department, call ahead and tell them about your recent travel, contact with someone diagnosed with COVID-19, and your symptoms. You should receive instructions from your physician's office regarding next steps of care.  . When you arrive at healthcare provider, tell the healthcare staff immediately you have returned from visiting [place], [place], [place], [place] or [place]; or traveled in the [place] to [place], [place], [place], or [place]; in the last two weeks or you have been in close contact with a person diagnosed with COVID-19 in the last 2 weeks.   . Tell the health care staff about your symptoms: fever, cough and shortness of breath. .   After you have been seen by a medical provider, you will be either: o Tested for (COVID-19) and discharged home on quarantine except to seek medical care if symptoms worsen, and asked to  - Stay home and avoid contact with others until you get your results (4-5 days)  - Avoid travel on public transportation if possible (such as bus, train, or airplane) or o Sent to the Emergency Department by EMS for evaluation, COVID-19 testing, and possible admission depending on your condition and test results.  What to do if you are LOW  RISK for COVID-19?  Reduce your risk of any infection by using the same precautions used for avoiding the common cold or flu:  . Wash your hands often with soap and warm water for at least 20 seconds.  If soap and water are not readily available, use an alcohol-based hand sanitizer with at least 60% alcohol.  . If coughing or sneezing, cover your mouth and nose by coughing or sneezing into the elbow areas of your shirt or coat, into a tissue or into your sleeve (not your hands). . Avoid shaking hands with others and consider head nods or verbal greetings only. . Avoid touching your eyes, nose, or mouth with unwashed hands.  . Avoid close contact with people who are sick. . Avoid places or events with large numbers of people in one location, like concerts or sporting events. . Carefully consider travel plans you have or are making. . If you are planning any travel outside or inside the [place], visit the CDC's Travelers' Health webpage for the latest health notices. . If you have some symptoms but not all symptoms, continue to monitor at home and seek medical attention if your symptoms worsen. . If you are having a medical emergency, call 911.   ADDITIONAL HEALTHCARE OPTIONS FOR PATIENTS  [facility] / e-Visit: [email or web address]         [facility]: [phone]  [facility]: [phone]                   [facility]: [phone]   

## 2018-12-08 NOTE — Progress Notes (Signed)
Pt presents today for f/u with Dr. Sondra Come. Pt denies c/o pain. Pt denies dysuria/hematuria. Pt denies vaginal bleeding/discharge. Pt denies rectal bleeding, diarrhea/constipation. Pt denies N/V. Pt is unaccompanied.   BP 124/77 (BP Location: Left Arm, Patient Position: Sitting)   Pulse 73   Temp 98.4 F (36.9 C) (Oral)   Resp 16   Ht 5\' 2"  (1.575 m)   Wt 85 lb 2 oz (38.6 kg)   SpO2 99%   BMI 15.57 kg/m   Wt Readings from Last 3 Encounters:  12/08/18 85 lb 2 oz (38.6 kg)  10/31/18 86 lb 6.4 oz (39.2 kg)  09/01/18 84 lb 9.6 oz (38.4 kg)   Loma Sousa, RN BSN

## 2018-12-08 NOTE — Progress Notes (Signed)
Radiation Oncology         [phone] ________________________________  Name: Erin Kent MRN: [id number]  Date: 12/08/2018  DOB: 1951/01/18  Follow-Up Visit Note  CC: Patient, [name]  Isabel Caprice, MD    ICD-10-CM   1. Malignant neoplasm of cervix, unspecified site [facility]) C53.9     Diagnosis:   68 y.o. female with Stage IIIB (cT3b, cN1, cM0) malignant neoplasm of cervix  Interval Since Last Radiation:  4 months   Radiation treatment dates:   05/17/2018-07/01/2018, HDR 10/14, 10/21, 10/31, 11/4, 11/13 Site/dose: 1. pelvis / 1.8 Gy x 25 fractions for a total dose of 45 Gy 2. pelvic Boost / 1.8 Gy x 5 fractions for a total dose of 9 Gy 3. Cervix / 5.5 Gy x 5 fractions for a total dose of 27.5 Gy  Narrative:  The patient returns today for routine follow-up.  She underwent restaging PET scan on 10/27/2018 which showed complete metabolic response to therapy, without residual or recurrent hypermetabolic disease.   On review of systems, the patient denies any pain. She denies dysuria or hematuria. She denies vaginal bleeding or discharge. She denies rectal bleeding, diarrhea, or constipation. She denies nausea or vomiting.         ALLERGIES:  has No Known Allergies.  Meds: No current outpatient medications on file.   No current facility-administered medications for this encounter.     Physical Findings: The patient is in no acute distress. Patient is alert and oriented.  height is 5\' 2"  (1.575 m) and weight is 85 lb 2 oz (38.6 kg). Her oral temperature is 98.4 F (36.9 C). Her blood pressure is 124/77 and her pulse is 73. Her respiration is 16 and oxygen saturation is 99%.   Lungs are clear to auscultation bilaterally. Heart has regular rate and rhythm. No palpable cervical, supraclavicular, or axillary adenopathy. Abdomen soft, non-tender, normal bowel sounds.  On pelvic examination the external genitalia were unremarkable.  No inguinal adenopathy. I attempted a speculum  exam, but the vaginal vault was significantly narrowed and foreshortened. Even on index finger exam, the patient could not tolerate due to pain issues. Limited small 5th digital exam revealed no obvious lower pelvic masses.  Lab Findings: Lab Results  Component Value Date   WBC 4.1 11/17/2018   HGB 12.4 11/17/2018   HCT 39.8 11/17/2018   MCV 99.3 11/17/2018   PLT 188 11/17/2018    Radiographic Findings: Ir Removal [employer] W/o Fl Mod Sed  Result Date: 11/17/2018 INDICATION: 69 year old with history of cervical cancer. Port was placed by Dr. Barbie Banner on 05/12/2018. Patient has completed treatment and Port-A-Cath is no longer needed. EXAM: REMOVAL RIGHT IJ VEIN PORT-A-CATH MEDICATIONS: Ancef 2 g; The antibiotic was administered within an appropriate time interval prior to skin puncture. ANESTHESIA/SEDATION: Moderate (conscious) sedation was employed during this procedure. A total of Versed 1 mg and Fentanyl 50 mcg was administered intravenously. Moderate Sedation Time: 16 minutes. The patient's level of consciousness and vital signs were monitored continuously by radiology nursing throughout the procedure under my direct supervision. FLUOROSCOPY TIME:  None COMPLICATIONS: None immediate. PROCEDURE: Informed written consent was obtained from the patient after a thorough discussion of the procedural risks, benefits and alternatives. All questions were addressed. Maximal Sterile Barrier Technique was utilized including caps, mask, sterile gowns, sterile gloves, sterile drape, hand hygiene and skin antiseptic. A timeout was performed prior to the initiation of the procedure. The right chest was prepped and draped  in a sterile fashion. Lidocaine was utilized for local anesthesia. An incision was made over the previously healed surgical incision. Utilizing blunt dissection, the port catheter and reservoir were removed from the underlying subcutaneous tissue in their entirety. Securing sutures were also  removed. The pocket was irrigated with a copious amount of sterile normal saline. The subcutaneous tissue was closed with 3-0 Vicryl interrupted subcutaneous stitches. A 4-0 Vicryl running subcuticular stitch was utilized to approximate the skin. Dermabond was applied. IMPRESSION: Successful right IJ vein Port-A-Cath explant. Electronically Signed   By: Markus Daft M.D.   On: 11/17/2018 16:12    Impression:  Stage IIIB (cT3b, cN1, cM0) malignant neoplasm of cervix. Limited exam reveals no obvious recurrence, but exam is severely limited, as above. Fortunately, recent PET scan shows no active disease, so the patient would be considered in remission.  Significant vaginal narrowing may be in part related to the fact that she required distal vaginal brachytherapy given her presentation with extension into the distal third of the vagina  Plan:  Patient was given extra small vaginal dilators to begin using. We gave her additional instructions and expressed the importance of using the dilators for follow-up examinations. Patient will follow up with Dr. Denman George in June and follow up in radiation oncology in September.  ____________________________________  Blair Promise, PhD, MD  This document serves as a record of services personally performed by Gery Pray, MD. It was created on his behalf by Rae Lips, a trained medical scribe. The creation of this record is based on the scribe's personal observations and the provider's statements to them. This document has been checked and approved by the attending provider.

## 2018-12-08 NOTE — Addendum Note (Signed)
Encounter addended by: Gery Pray, MD on: 12/08/2018 5:09 PM  Actions taken: LOS modified

## 2018-12-20 ENCOUNTER — Encounter: Payer: Self-pay | Admitting: General Practice

## 2018-12-20 NOTE — Progress Notes (Signed)
[facility] Team contacted patient to assess for food insecurity and other psychosocial needs during current COVID19 pandemic."Its not hard on [place] at all, have plenty of food, we're used to being on our own."  "The only thing we may need to venture out for cat food."  Patient/family expressed no needs at this time.  Support Team member encouraged patient to call if changes occur or they have any other questions/concerns.   Beverely Pace, [place]

## 2019-01-30 ENCOUNTER — Telehealth: Payer: Self-pay | Admitting: *Deleted

## 2019-01-30 NOTE — Telephone Encounter (Signed)
Called and spoke with the patient regarding her appt for tomorrow. Patient has no signs/sympotms, has not traveled and has had no exposure to COVID. Explained the new check in process, new parking process and the mask/no visitor policy.  

## 2019-01-31 ENCOUNTER — Inpatient Hospital Stay: Payer: Medicaid Other | Attending: Obstetrics | Admitting: Gynecologic Oncology

## 2019-01-31 ENCOUNTER — Other Ambulatory Visit: Payer: Self-pay

## 2019-01-31 ENCOUNTER — Encounter: Payer: Self-pay | Admitting: Gynecologic Oncology

## 2019-01-31 VITALS — BP 126/63 | HR 63 | Temp 98.5°F | Resp 18 | Ht 62.0 in | Wt 85.7 lb

## 2019-01-31 DIAGNOSIS — Z9221 Personal history of antineoplastic chemotherapy: Secondary | ICD-10-CM | POA: Diagnosis not present

## 2019-01-31 DIAGNOSIS — C539 Malignant neoplasm of cervix uteri, unspecified: Secondary | ICD-10-CM | POA: Diagnosis present

## 2019-01-31 DIAGNOSIS — Z923 Personal history of irradiation: Secondary | ICD-10-CM | POA: Insufficient documentation

## 2019-01-31 NOTE — Patient Instructions (Addendum)
Please make sure to start using your dilator as we discussed today.  Is very important to keep the vagina open.  Because your exam is now so limited secondary to closure of the vagina, we will plan on a CT scan in August.  Please keep your appointment with Dr. Sondra Come in September and return to see [place] in November. Call the office at [phone] in September to schedule follow up with Dr. Fermin Schwab in November.

## 2019-01-31 NOTE — Progress Notes (Signed)
Consult Note: Gyn-Onc  Erin Kent 68 y.o. female  CC:  Chief Complaint  Patient presents with  . Malignant neoplasm of cervix, unspecified site [facility])    HPI:The patient presented to Dr. Elly Modena with postmenopausal bleeding and was initially seen by Dr. Gerarda Fraction 04/2018 and was diagnosed with stage IIIB Cervical cancer. PET staging at that time revealed: IMPRESSION: 1. Large cervical mass may invade into the myometrium in down into the vagina, maximum SUV 21.3. There is a malignant left external iliac node measuring 1.4 cm in short axis with maximum SUV 14.1. 2. Accentuated activity in the cecum and ascending colon is likely physiologic given that it has no CT correlate. Correlation with the patient's colon cancer screening history is recommended. If screening is not up-to-date, appropriate screening should be considered.  Interval History:  05/17/2018-07/01/2018, HDR 10/14, 10/21, 10/31, 11/4, 07/27/2018 Site/dose:1.pelvis / 1.8 Gy x 25 fractions for a total dose of 45 Gy 2. pelvicBoost / 1.8 Gy x 5 fractions for a total dose of 9 Gy 3. Cervix /5.5Gy x 5 fractions for a total dose of 27.5Gy  Narrative:  She underwent restaging PET scan on 10/27/2018 which showed complete metabolic response to therapy, without residual or recurrent hypermetabolic disease.She was last seen by Dr. Sondra Come in 3/20. She comes in today for follow up. She denies any complaints today. She denies any bleeding.  She denies any interval family history or medical history changes.  Review of Systems: Constitutional: Denies fever. Skin: No rash Cardiovascular: No chest pain, shortness of breath, or edema  Pulmonary: No cough  Gastro Intestinal: No nausea, vomiting, constipation, or diarrhea reported.  Genitourinary: No change in bladder habits. Denies vaginal bleeding and discharge.  Musculoskeletal: No joint pain.  Neurologic: No weakness Psychology: No complaints  Current Meds:  No outpatient encounter  medications on file as of 01/31/2019.   No facility-administered encounter medications on file as of 01/31/2019.     Allergy: No Known Allergies  Social Hx:   Social History   Socioeconomic History  . Marital status: Single    Spouse name: Not on file  . Number of children: 0  . Years of education: Not on file  . Highest education level: Not on file  Occupational History  . Not on file  Social Needs  . Financial resource strain: Not on file  . Food insecurity:    Worry: Not on file    Inability: Not on file  . Transportation needs:    Medical: Not on file    Non-medical: Not on file  Tobacco Use  . Smoking status: Never Smoker  . Smokeless tobacco: Never Used  Substance and Sexual Activity  . Alcohol use: No  . Drug use: No  . Sexual activity: Not Currently    Birth control/protection: None, Post-menopausal  Lifestyle  . Physical activity:    Days per week: Not on file    Minutes per session: Not on file  . Stress: Not on file  Relationships  . Social connections:    Talks on phone: Not on file    Gets together: Not on file    Attends religious service: Not on file    Active member of club or organization: Not on file    Attends meetings of clubs or organizations: Not on file    Relationship status: Not on file  . Intimate partner violence:    Fear of current or ex partner: Not on file    Emotionally abused: Not on file  Physically abused: Not on file    Forced sexual activity: Not on file  Other Topics Concern  . Not on file  Social History Narrative  . Not on file    Past Surgical Hx:  Past Surgical History:  Procedure Laterality Date  . CYSTOSCOPY N/A 04/28/2018   Procedure: CYSTOSCOPY;  Surgeon: Isabel Caprice, MD;  Location: [facility];  Service: Gynecology;  Laterality: N/A;  . EUA/ PAPSMEAR/  CERVICAL MASS BIOPSY  04-12-2018   dr peggy constant @[facility]   . IR IMAGING GUIDED PORT INSERTION  05/12/2018  . IR REMOVAL TUN ACCESS W/ PORT  W/O FL MOD SED  11/17/2018  . TANDEM RING INSERTION N/A 06/27/2018   Procedure: TANDEM RING INSERTION;  Surgeon: Gery Pray, MD;  Location: [facility];  Service: Urology;  Laterality: N/A;  . TANDEM RING INSERTION N/A 07/04/2018   Procedure: TANDEM RING INSERTION;  Surgeon: Gery Pray, MD;  Location: [facility];  Service: Urology;  Laterality: N/A;  . TANDEM RING INSERTION N/A 07/14/2018   Procedure: TANDEM RING INSERTION;  Surgeon: Gery Pray, MD;  Location: [facility];  Service: Urology;  Laterality: N/A;  . TANDEM RING INSERTION N/A 07/18/2018   Procedure: TANDEM CYLINDER INSERTION;  Surgeon: Gery Pray, MD;  Location: [facility];  Service: Urology;  Laterality: N/A;  . TANDEM RING INSERTION N/A 07/27/2018   Procedure: TANDEM RING INSERTION;  Surgeon: Gery Pray, MD;  Location: [facility];  Service: Urology;  Laterality: N/A;  . WISDOM TOOTH EXTRACTION      Past Medical Hx:  Past Medical History:  Diagnosis Date  . Aortic atherosclerosis ([place])   . Diarrhea    chronic secondary to radiation  . History of cancer chemotherapy 05-13-2018 to 06-24-2018  . History of external beam radiation therapy    completed 06-24-2018  cervical cancer---  followed with   . Hypokalemia   . Hypomagnesemia   . Iron deficiency anemia   . Malignant neoplasm cervix [facility]) oncologist-- dr Clemon Chambers   dx 04-12-2018---  invasive SCC of cervix,  Stage IIIB1, (cT3b,cN1,cM0)-- completed chemotherapy and external beam radiation on 10-11-219--  started high dose radiation 06-22-2018 for 5 treatments  . MDD (major depressive disorder)    w/ psychosis hallucination episode 06-10-2018  . Osteopenia   . Ovarian cyst    Left  . Pancytopenia, acquired ([place])   . Poor appetite   . Port-A-Cath in place     Oncology Hx:  Oncology History   Cancer Staging Malignant neoplasm of cervix ([place]) Staging form: Cervix Uteri, AJCC 8th  Edition - Clinical: Stage IIIB (cT3b, cN1, cM0) - Signed by [name], MD on 05/04/2018       Malignant neoplasm of cervix ([place])   03/13/2018 Imaging    US pelvis There are mobile echogenic foci within the endometrial cavity suggestive of gas. This is nonspecific in etiology however may be secondary to endometritis. Correlate for history of recent instrumentation. This obscures visualization of the endometrial tissue and therefore a follow-up pelvic ultrasound in 2-4 weeks is recommended after resolution of the acute symptomatology if symptoms persist to further evaluate the endometrium.     03/13/2018 Initial Diagnosis    She initially presented with PMB    04/12/2018 Pathology Results    Cervix, biopsy, mass - INVASIVE SQUAMOUS CELL CARCINOMA - SEE COMMENT    04/12/2018 Surgery    PREOPERATIVE DIAGNOSIS:  Postmenopausal vaginal bleeding. POSTOPERATIVE DIAGNOSIS: The same PROCEDURE: Exam  under anesthesia, pap smear, cervical mass biopsy SURGEON:  Dr. [name]  INDICATIONS: 68 y.o. yo [id number] with PMB here for exam under anesthesia.Risks of surgery were discussed with the patient including but not limited to: bleeding which may require transfusion; infection which may require antibiotics; injury to uterus or surrounding organs; need for additional procedures including laparotomy or laparoscopy; and other postoperative/anesthesia complications. Written informed consent was obtained.    FINDINGS:  An 8-week size midline uterus.  No adnexal mass palpable on exam. Normal cervix not visualized. Friable mass seen in involving the vagina at the level of the cervix obliterating the anterior and posterior fornix.  ANESTHESIA:   General INTRAVENOUS FLUIDS:  300 ml of LR ESTIMATED BLOOD LOSS: 20 ml. SPECIMENS: pap smear, cervical mass biopsy COMPLICATIONS:  None immediate.     04/25/2018 PET scan    1. Large cervical mass may invade into the myometrium in down into the vagina,  maximum SUV 21.3. There is a malignant left external iliac node measuring 1.4 cm in short axis with maximum SUV 14.1. 2. Accentuated activity in the cecum and ascending colon is likely physiologic given that it has no CT correlate. Correlation with the patient's colon cancer screening history is recommended. If screening is not up-to-date, appropriate screening should be considered. 3.  Aortic Atherosclerosis (ICD10-I70.0).     04/28/2018 Surgery    Pre-operative Diagnosis:  At least Stage 2B SCCa Cervical Cancer  Post-operative Diagnosis: At least clinical Stage 3A SCCa Cervical Cancer  Operation:  Exam under anesthesia due to intolerance for vaginal exam in office Cystoscopy due to concern for extension to posterior bladder (from anterior vaginal wall lesion)  Operative Findings:   1. Parametrial involvement on right  2. Right sided subvaginal tumor burden ~3cm. This approximates the right ureteral insertion into the bladder based on my exam during cystoscopy. 3. Extension of disease down upper half of vagina on lateral and posterior walls. 4. Extension of disease down to lower third of anterior vagina (versus skip lesion) thus making her Stage 3A 5. Cystoscopy revealed patent bilateral ureteral orifices and no bladder mucosa invasion or areas of concern. 6. Rectal exam grossly no disease.     05/04/2018 Cancer Staging    Staging form: Cervix Uteri, AJCC 8th Edition - Clinical: Stage IIIB (cT3b, cN1, cM0) - Signed by [name], MD on 05/04/2018    05/12/2018 Procedure    Successful 8 French right internal jugular vein power port placement with its tip at the SVC/RA junction    05/13/2018 - 06/24/2018 Chemotherapy    The patient had weekly cisplatin given with concurrent radiation     05/17/2018 - 07/27/2018 Radiation Therapy    Radiation treatment dates:05/17/2018-07/27/2018  Site/dose:1. Cervix, 1.8 Gy in 25 fractions for a total dose of 45 Gy 2. Pelvis   Boost, 1.8 Gy in 5 fractions for a total dose of 9 Gy 3. Cervix,5.5Gy in 5 fractions for a total dose of 27.5Gy    06/03/2018 Adverse Reaction    She missed her chemo due to severe depression and was sent to the ER for suicidal ideation.  She was seen by psychiatrist.    10/27/2018 PET scan    IMPRESSION: 1. Complete metabolic response to therapy, without residual or recurrent hypermetabolic disease. 2.  Aortic Atherosclerosis (ICD10-I70.0).     11/17/2018 Imaging    Successful right IJ vein Port-A-Cath explant.     Family Hx:  Family History  Problem Relation Age of Onset  . Hypertension Mother  Vitals:  Blood pressure 126/63, pulse 63, temperature 98.5 F (36.9 C), temperature source Oral, resp. rate 18, height 5\' 2"  (1.575 m), weight 85 lb 11.2 oz (38.9 kg), SpO2 99 %.  Physical Exam: Thin female who appears older than stated age in no acute distress.  Neck: Supple, no lymphadenopathy, no thyromegaly.  Abdomen: Soft, nontender, nondistended.  There are no palpable mass or hepatosplenomegaly.  There is no rebound or guarding.  Groins: No lymphadenopathy.  Extremities: No edema  Pelvic: External genitalia within normal limits.  Attempt was made to place the small speculum which she did not tolerate and I was not able to advance.  I then went to the extra small speculum and similarly was only able to insert a 1 cm.  I tried a uni-digital pelvic exam and similarly the vagina is completely agglutinated to only a centimeter in length.  Rectal exam was performed and there is smooth induration posteriorly, however,  there is no discrete mass.  Exam is very limited secondary to the agglutinated nature of the vagina.  Assessment/Plan: Patient is a 68 year old who was diagnosed with stage IIIb cervical carcinoma in August 2019.  She is completed definitive therapy with chemo radiation under the care of Drs. Kinard and Alvy Bimler and she had her last HDR in 07/2018.   She had negative posttreatment PET imaging in February 2020 that showed no evidence of recurrent disease and complete metabolic resolution.  The patient states that she uses her vaginal dilator every other day and use it as recently as last week.  I find this somewhat difficult to believe and I did discuss that with her.  It appears that she has not used her vaginal dilator whatsoever and the vagina is completely agglutinated to only a centimeter in length.  When I asked her to show me how deep she is able to get the vaginal dilator and she was unable to tell me.  When asked her the size of the vaginal dilator initially with her finger she was showing me her pinky finger which is much smaller than the usual dilators are.  She was provided a vaginal dilator today to use.  I am not sure if this level of agglutination can be remedied, and I somehow doubt it.  The rationale for using the vaginal dilators was expressed to the patient and she voiced understanding but I do not think that she will be compliant.  Therefore, as her exam is going to be so limited I believe we will need to proceed with imaging in August which will be 6 months after her last PET as the only way to really be able to see if there is any disease within the pelvis.  We will notify her of the results.  She has a follow-up scheduled with Dr. Randa Ngo in September.  She will return to see [place] in November.  > 25 minutes was spent with the patient today.  Erin Kent A., MD 01/31/2019, 10:26 AM

## 2019-02-21 ENCOUNTER — Ambulatory Visit: Payer: Self-pay | Admitting: Gynecology

## 2019-04-17 ENCOUNTER — Inpatient Hospital Stay: Payer: Medicare Other | Attending: Obstetrics

## 2019-04-17 DIAGNOSIS — C539 Malignant neoplasm of cervix uteri, unspecified: Secondary | ICD-10-CM | POA: Insufficient documentation

## 2019-04-18 ENCOUNTER — Inpatient Hospital Stay: Payer: Medicare Other

## 2019-04-18 ENCOUNTER — Other Ambulatory Visit: Payer: Self-pay

## 2019-04-18 DIAGNOSIS — C539 Malignant neoplasm of cervix uteri, unspecified: Secondary | ICD-10-CM | POA: Diagnosis not present

## 2019-04-18 LAB — BASIC METABOLIC PANEL
Anion gap: 8 (ref 5–15)
BUN: 14 mg/dL (ref 8–23)
CO2: 28 mmol/L (ref 22–32)
Calcium: 8.7 mg/dL — ABNORMAL LOW (ref 8.9–10.3)
Chloride: 107 mmol/L (ref 98–111)
Creatinine, Ser: 0.78 mg/dL (ref 0.44–1.00)
GFR calc Af Amer: >60 mL/min (ref >60)
GFR calc non Af Amer: >60 mL/min (ref >60)
Glucose, Bld: 87 mg/dL (ref 70–99)
Potassium: 3.7 mmol/L (ref 3.5–5.1)
Sodium: 143 mmol/L (ref 135–145)

## 2019-04-19 ENCOUNTER — Encounter (HOSPITAL_COMMUNITY): Payer: Self-pay

## 2019-04-19 ENCOUNTER — Ambulatory Visit (HOSPITAL_COMMUNITY)
Admission: RE | Admit: 2019-04-19 | Discharge: 2019-04-19 | Disposition: A | Discharge status: Home / Self Care | Payer: Medicare Other | Source: Ambulatory Visit | Attending: Gynecologic Oncology | Admitting: Gynecologic Oncology

## 2019-04-19 DIAGNOSIS — C539 Malignant neoplasm of cervix uteri, unspecified: Secondary | ICD-10-CM

## 2019-04-19 MED ORDER — SODIUM CHLORIDE (PF) 0.9 % IJ SOLN
INTRAMUSCULAR | Status: AC
  Filled 2019-04-19: qty 50

## 2019-04-19 MED ORDER — IOHEXOL 300 MG/ML  SOLN
75.0000 mL | Freq: Once | INTRAMUSCULAR | Status: AC | PRN
  Administered 2019-04-19: 10:00:00 75 mL via INTRAVENOUS

## 2019-04-21 ENCOUNTER — Other Ambulatory Visit: Payer: Self-pay | Admitting: Gynecologic Oncology

## 2019-04-21 DIAGNOSIS — M899 Disorder of bone, unspecified: Secondary | ICD-10-CM

## 2019-04-21 NOTE — Progress Notes (Signed)
MR of lumbar spine ordered to evaluate lesion noted on CT.

## 2019-04-24 ENCOUNTER — Telehealth: Payer: Self-pay

## 2019-04-24 DIAGNOSIS — C539 Malignant neoplasm of cervix uteri, unspecified: Secondary | ICD-10-CM

## 2019-04-24 MED ORDER — ALPRAZOLAM 0.25 MG PO TABS: ORAL_TABLET | ORAL | 0 refills | Status: DC

## 2019-04-24 NOTE — Telephone Encounter (Signed)
Spoke with caregiver Ms Richardson Landry. Told her that the CT did not show anything concerning except for a new area seen on her lumber spine.  An MRI of the spine is recommended for further evaluation per Joylene John, NP Pt not sure if she is claustrophobic.  She would like something to take an hour prior to the scan to help her relax.

## 2019-04-24 NOTE — Telephone Encounter (Signed)
Gave Erin Kent the MRI appointment for 05-04-19 at [facility] MRI at 3 pm with arrival at 2:30 pm. No dietary restrictions prior to scan.

## 2019-04-24 NOTE — Telephone Encounter (Signed)
Told Erin Kent that the prescription for generic Xanax 0.25 mg was sent to [facility]. She is to take the xanax one hour to the scan at 3 pm on 05-04-19. Erin Kent verbalized understanding.

## 2019-05-04 ENCOUNTER — Ambulatory Visit (HOSPITAL_COMMUNITY): Payer: Medicare Other

## 2019-05-12 ENCOUNTER — Ambulatory Visit (HOSPITAL_COMMUNITY): Payer: Medicare Other

## 2019-05-24 ENCOUNTER — Ambulatory Visit (HOSPITAL_COMMUNITY)
Admission: RE | Admit: 2019-05-24 | Discharge: 2019-05-24 | Disposition: A | Discharge status: Home / Self Care | Payer: Medicare Other | Source: Ambulatory Visit | Attending: Gynecologic Oncology | Admitting: Gynecologic Oncology

## 2019-05-24 ENCOUNTER — Other Ambulatory Visit: Payer: Self-pay

## 2019-05-24 DIAGNOSIS — M899 Disorder of bone, unspecified: Secondary | ICD-10-CM | POA: Diagnosis present

## 2019-05-24 MED ORDER — GADOBUTROL 1 MMOL/ML IV SOLN
4.0000 mL | Freq: Once | INTRAVENOUS | Status: AC | PRN
  Administered 2019-05-24: 15:00:00 4 mL via INTRAVENOUS

## 2019-05-29 ENCOUNTER — Telehealth: Payer: Self-pay

## 2019-05-29 NOTE — Telephone Encounter (Signed)
Told care giver Ms Richardson Landry for Ms Nez that the MRI showed a compression fracture of L5 without discrete underlying lesion to suggest pathological fracture per Joylene John, NP. Ms Gentzel states that she is not in any pain in her back. Told Ms Richardson Landry that Joylene John, NP said that she needs to establish care with A PCP.  Scheduled appointment at the [facility] at [address] on 07-19-19 at 1350.  She needs to arrive at 1330. The office number is [phone]. Ms Richardson Landry verbalized understanding.

## 2019-06-08 ENCOUNTER — Other Ambulatory Visit: Payer: Self-pay

## 2019-06-08 ENCOUNTER — Ambulatory Visit
Admission: RE | Admit: 2019-06-08 | Discharge: 2019-06-08 | Disposition: A | Discharge status: Home / Self Care | Payer: Medicare Other | Source: Ambulatory Visit | Attending: Radiation Oncology | Admitting: Radiation Oncology

## 2019-06-08 ENCOUNTER — Encounter: Payer: Self-pay | Admitting: Radiation Oncology

## 2019-06-08 VITALS — BP 125/70 | HR 70 | Temp 99.1°F | Resp 18 | Ht 62.0 in | Wt 89.1 lb

## 2019-06-08 DIAGNOSIS — Z8542 Personal history of malignant neoplasm of other parts of uterus: Secondary | ICD-10-CM | POA: Diagnosis not present

## 2019-06-08 DIAGNOSIS — C539 Malignant neoplasm of cervix uteri, unspecified: Secondary | ICD-10-CM

## 2019-06-08 DIAGNOSIS — M4316 Spondylolisthesis, lumbar region: Secondary | ICD-10-CM | POA: Insufficient documentation

## 2019-06-08 DIAGNOSIS — Z923 Personal history of irradiation: Secondary | ICD-10-CM | POA: Diagnosis not present

## 2019-06-08 NOTE — Patient Instructions (Signed)
Coronavirus (COVID-19) Are you at risk?  Are you at risk for the Coronavirus (COVID-19)?  To be considered HIGH RISK for Coronavirus (COVID-19), you have to meet the following criteria:  . Traveled to [place], [place], [place], [place] or [place]; or in the [place] to [place], [place], [place], or [place]; and have fever, cough, and shortness of breath within the last 2 weeks of travel OR . Been in close contact with a person diagnosed with COVID-19 within the last 2 weeks and have fever, cough, and shortness of breath . IF YOU DO NOT MEET THESE CRITERIA, YOU ARE CONSIDERED LOW RISK FOR COVID-19.  What to do if you are HIGH RISK for COVID-19?  . If you are having a medical emergency, call 911. . Seek medical care right away. Before you go to a doctor's office, urgent care or emergency department, call ahead and tell them about your recent travel, contact with someone diagnosed with COVID-19, and your symptoms. You should receive instructions from your physician's office regarding next steps of care.  . When you arrive at healthcare provider, tell the healthcare staff immediately you have returned from visiting [place], [place], [place], [place] or [place]; or traveled in the [place] to [place], [place], [place], or [place]; in the last two weeks or you have been in close contact with a person diagnosed with COVID-19 in the last 2 weeks.   . Tell the health care staff about your symptoms: fever, cough and shortness of breath. . After you have been seen by a medical provider, you will be either: o Tested for (COVID-19) and discharged home on quarantine except to seek medical care if symptoms worsen, and asked to  - Stay home and avoid contact with others until you get your results (4-5 days)  - Avoid travel on public transportation if possible (such as bus, train, or airplane) or o Sent to the Emergency Department by EMS for evaluation, COVID-19 testing, and possible  admission depending on your condition and test results.  What to do if you are LOW RISK for COVID-19?  Reduce your risk of any infection by using the same precautions used for avoiding the common cold or flu:  . Wash your hands often with soap and warm water for at least 20 seconds.  If soap and water are not readily available, use an alcohol-based hand sanitizer with at least 60% alcohol.  . If coughing or sneezing, cover your mouth and nose by coughing or sneezing into the elbow areas of your shirt or coat, into a tissue or into your sleeve (not your hands). . Avoid shaking hands with others and consider head nods or verbal greetings only. . Avoid touching your eyes, nose, or mouth with unwashed hands.  . Avoid close contact with people who are sick. . Avoid places or events with large numbers of people in one location, like concerts or sporting events. . Carefully consider travel plans you have or are making. . If you are planning any travel outside or inside the [place], visit the CDC's Travelers' Health webpage for the latest health notices. . If you have some symptoms but not all symptoms, continue to monitor at home and seek medical attention if your symptoms worsen. . If you are having a medical emergency, call 911.   ADDITIONAL HEALTHCARE OPTIONS FOR PATIENTS  [facility] [place] [facility] / e-Visit: [email or web address]         [facility]: [phone]  [facility]: [phone]                   [facility]: [phone]   

## 2019-06-08 NOTE — Progress Notes (Signed)
Radiation Oncology         [phone] ________________________________  Name: Erin Kent MRN: [id number]  Date: 06/08/2019  DOB: 05/16/1951  Follow-Up Visit Note  CC: Patient, [name]  Isabel Caprice, MD    ICD-10-CM   1. Malignant neoplasm of cervix, unspecified site [facility])  C53.9     Diagnosis:   68 y.o. female with Stage IIIB (cT3b, cN1, cM0) malignant neoplasm of cervix  Interval Since Last Radiation:  10 months   Radiation treatment dates:   05/17/2018-07/01/2018, HDR 10/14, 10/21, 10/31, 11/4, 11/13  Site/dose: 1. pelvis / 1.8 Gy x 25 fractions for a total dose of 45 Gy 2. pelvic Boost / 1.8 Gy x 5 fractions for a total dose of 9 Gy 3. Cervix / 5.5 Gy x 5 fractions for a total dose of 27.5 Gy  Narrative:  The patient returns today for routine follow-up. She last saw Dr. Alycia Rossetti on 01/31/2019. On exam, the patient was found to have complete vaginal agglutination, consistent with not using her vaginal dilator.  Due to this limited pelvic exam, the patient underwent abdomen/pelvis CT on 04/19/2019, which showed: no discretely appreciated soft tissue mass of the cervix; no abdominal or pelvic lymphadenopathy; a new sclerotic superior endplate deformity of L5.  She proceeded to lumbar spine MRI on 05/24/2019 to evaluate the L5 deformity. This showed: subacute-appearing superior endplate compression fracture of L5 without discrete underlying lesion to suggest a pathologic fracture; partially visualized acute-to-subacute bilateral sacral alar fractures, likely sacral insufficiency fractures.  On review of systems, the patient is doing well overall without complaints today. She denies any pain, dysuria or hematuria, vaginal bleeding or discharge, rectal bleeding, diarrhea or constipation, and nausea or vomiting.    ALLERGIES:  has No Known Allergies.  Meds: No current outpatient medications on file.   No current facility-administered medications for this encounter.      Physical Findings: The patient is in no acute distress. Patient is alert and oriented.  height is 5\' 2"  (1.575 m) and weight is 89 lb 2 oz (40.4 kg). Her temporal temperature is 99.1 F (37.3 C). Her blood pressure is 125/70 and her pulse is 70. Her respiration is 18 and oxygen saturation is 96%.   Lungs are clear to auscultation bilaterally. Heart has regular rate and rhythm. No palpable cervical, supraclavicular, or axillary adenopathy. Abdomen soft, non-tender, normal bowel sounds.  On pelvic examination the external genitalia were unremarkable. No  Inguinal adenopathy noted. The patient's vaginal vault was significantly narrowed and foreshortened but would permit 5th finger exam to approximately 3 cm. Unable to view the cervical os due to severe agglutination. A rectal exam was performed revealing no obvious pelvic masses. The cervical area does not appear enlarged and is smooth through rectal approach.  Lab Findings: Lab Results  Component Value Date   WBC 4.1 11/17/2018   HGB 12.4 11/17/2018   HCT 39.8 11/17/2018   MCV 99.3 11/17/2018   PLT 188 11/17/2018    Radiographic Findings: Mr Lumbar Spine W Wo Contrast  Result Date: 05/25/2019 CLINICAL DATA:  History of cervical cancer status post chemotherapy and radiation with new compression fracture of L5 on CT. EXAM: MRI LUMBAR SPINE WITHOUT AND WITH CONTRAST TECHNIQUE: Multiplanar and multiecho pulse sequences of the lumbar spine were obtained without and with intravenous contrast. CONTRAST:  26mL GADAVIST GADOBUTROL 1 MMOL/ML IV SOLN COMPARISON:  CT 04/19/2019 FINDINGS: Segmentation:  Standard. Alignment:  3 mm grade 1 anterolisthesis L4 on L5. Vertebrae:  Compression deformity of the L5 superior endplate with patchy marrow edema and enhancement. Approximately 25-30% vertebral body height loss. No extension of marrow signal changes to the pedicles. No vertebral body expansion or abnormal paraspinal signal. Partially visualized irregular low  T1 signal changes within the bilateral sacral ala, left greater than right (series 7, image 37) compatible with sacral alar fractures. Incidental bilateral sacral perineural cysts. Conus medullaris and cauda equina: Conus extends to the L1 level. Conus and cauda equina appear normal. Paraspinal and other soft tissues: Small cortically based T2 hyperintense lesionswithin the bilateral kidneys, incompletely characterized, but most likely represent cysts. Disc levels: T12-L1: No significant disc protrusion, foraminal stenosis, or canal stenosis. L1-L2: No significant disc protrusion, foraminal stenosis, or canal stenosis. L2-L3: No significant disc protrusion, foraminal stenosis, or canal stenosis. L3-L4: No significant disc protrusion, foraminal stenosis, or canal stenosis. L4-L5: Mild diffuse disc bulge with minimal bilateral facet arthrosis. Minimal impress upon the ventral CSF. No foraminal or canal stenosis. L5-S1: Minimal bilateral facet arthrosis. Mild disc bulge with posterior annular fissure. No foraminal or canal stenosis. IMPRESSION: 1. Subacute appearing superior endplate compression fracture of L5 without discrete underlying lesion to suggest a pathologic fracture. 2. There are partially visualized acute-to-subacute bilateral sacral alar fractures, likely sacral insufficiency fractures. 3. Mild lower lumbar spondylosis without foraminal or canal stenosis. 4. Small posterior annular fissure of the L5-S1 disc. Electronically Signed   By: Davina Poke M.D.   On: 05/25/2019 09:07    Impression:  Stage IIIB (cT3b, cN1, cM0) malignant neoplasm of cervix.   No obvious signs of recurrent on clinical exam, although exam was extremely limited. Patient underwent abdomen/pelvis CT in August, which showed no signs of recurrence. I encouraged the patient to continue using the vaginal dilator. She appears to have made some slight improvement in the severe agglutination when comparing my exam to Dr. Elenora Gamma.   Plan:  Patient will follow up with Gyn Onc in 3 months and follow up in radiation oncology in 6 months. We will also need to consider another CT scan to aid in her follow up.  ____________________________________  Blair Promise, PhD, MD  This document serves as a record of services personally performed by Gery Pray, MD. It was created on his behalf by Wilburn Mylar, a trained medical scribe. The creation of this record is based on the scribe's personal observations and the provider's statements to them. This document has been checked and approved by the attending provider.

## 2019-06-08 NOTE — Progress Notes (Signed)
Pt presents today for f/u with Dr. Sondra Come. Pt denies c/o pain. Pt denies dysuria/hematuria. Pt denies vaginal bleeding/discharge. Pt denies rectal bleeding, diarrhea/constipation. Pt denies abdominal bloating, N/V.   BP 125/70 (BP Location: Left Arm, Patient Position: Sitting)   Pulse 70   Temp 99.1 F (37.3 C) (Temporal)   Resp 18   Ht 5\' 2"  (1.575 m)   Wt 89 lb 2 oz (40.4 kg)   SpO2 96%   BMI 16.30 kg/m   Wt Readings from Last 3 Encounters:  06/08/19 89 lb 2 oz (40.4 kg)  01/31/19 85 lb 11.2 oz (38.9 kg)  12/08/18 85 lb 2 oz (38.6 kg)   Loma Sousa, RN BSN

## 2019-07-19 ENCOUNTER — Other Ambulatory Visit: Payer: Self-pay

## 2019-07-19 ENCOUNTER — Ambulatory Visit: Payer: Medicare Other | Attending: Family Medicine | Admitting: Family Medicine

## 2019-07-19 ENCOUNTER — Encounter: Payer: Self-pay | Admitting: Family Medicine

## 2019-07-19 DIAGNOSIS — Z9221 Personal history of antineoplastic chemotherapy: Secondary | ICD-10-CM | POA: Diagnosis not present

## 2019-07-19 DIAGNOSIS — Z923 Personal history of irradiation: Secondary | ICD-10-CM

## 2019-07-19 DIAGNOSIS — C539 Malignant neoplasm of cervix uteri, unspecified: Secondary | ICD-10-CM

## 2019-07-19 DIAGNOSIS — Z8739 Personal history of other diseases of the musculoskeletal system and connective tissue: Secondary | ICD-10-CM

## 2019-07-19 DIAGNOSIS — Z862 Personal history of diseases of the blood and blood-forming organs and certain disorders involving the immune mechanism: Secondary | ICD-10-CM | POA: Diagnosis not present

## 2019-07-19 DIAGNOSIS — R5383 Other fatigue: Secondary | ICD-10-CM

## 2019-07-19 NOTE — Progress Notes (Signed)
Patient verified DOB Patient has eaten today. Patient has not taken medication today. Patient denies pain at this time.

## 2019-07-19 NOTE — Progress Notes (Signed)
Virtual Visit via Telephone Note  I connected with Erin Kent on 07/19/19 at  1:50 PM EST by telephone and verified that I am speaking with the correct person using two identifiers.   I discussed the limitations, risks, security and privacy concerns of performing an evaluation and management service by telephone and the availability of in person appointments. I also discussed with the patient that there may be a patient responsible charge related to this service. The patient expressed understanding and agreed to proceed.  Patient Location: Home Provider Location: [facility] Others participating in call: call initiated by [place], CMA who then transferred the call to me   History of Present Illness:      68 year old female new to the practice who is status post treatment for stage IIIb cervical cancer in August 2019 after having onset of postmenopausal vaginal bleeding for which she presented to her GYN, Dr. Elly Modena.  Patient received radiation therapy to the pelvis and cervix along with chemotherapy.  Per oncology note, she underwent restaging PET scan on 10/27/2018 which showed complete metabolic response to therapy without residual or recurrent hypermetabolic disease.  Patient was last seen by oncology on 01/31/2019 and reports that she has follow-up in December.  She reports that she was told that she is now cancer free.  Patient still has some decrease in appetite and is drinking nutritional supplement shakes.  She otherwise states that she feels fine.  She denies any abdominal or pelvic pain.  No nausea/vomiting/diarrhea or constipation.  She has had no headaches or dizziness, no shortness of breath or cough, no fever or chills.  She denies any back pain, no muscle or joint pain.  No urinary frequency, urgency or dysuria and no issues with urinary incontinence.        She is not currently on any prescription medications and reports no significant past medical history.  She has received  influenza immunization within the past 2 weeks at her local [place].  She is not sure when she had her last mammogram or bone density scan.  She does not currently take vitamin D or calcium supplements.  She reports no recent falls.  She does not believe that her screening colonoscopy is up-to-date.  She is not interested at this time to be scheduled for mammogram, bone density scan or colonoscopy and does not want to have them scheduled for this spring.  Patient states that she will think about when she would like to have these test scheduled.  She reports only significant family history is mother having had hypertension, no other illnesses in the family.  Patient has no questions or concerns at today's visit.  She does have some continued fatigue.   Past Medical History:  Diagnosis Date  . Aortic atherosclerosis ([place])   . Diarrhea    chronic secondary to radiation  . History of cancer chemotherapy 05-13-2018 to 06-24-2018  . History of external beam radiation therapy    completed 06-24-2018  cervical cancer---  followed with   . Hypokalemia   . Hypomagnesemia   . Iron deficiency anemia   . Malignant neoplasm cervix [facility]) oncologist-- dr Clemon Chambers   dx 04-12-2018---  invasive SCC of cervix,  Stage IIIB1, (cT3b,cN1,cM0)-- completed chemotherapy and external beam radiation on 10-11-219--  started high dose radiation 06-22-2018 for 5 treatments  . MDD (major depressive disorder)    w/ psychosis hallucination episode 06-10-2018  . Osteopenia   . Ovarian cyst    Left  . Pancytopenia, acquired (  HCC)   . Poor appetite   . Port-A-Cath in place     Past Surgical History:  Procedure Laterality Date  . CYSTOSCOPY N/A 04/28/2018   Procedure: CYSTOSCOPY;  Surgeon: Isabel Caprice, MD;  Location[facility];  Service: Gynecology;  Laterality: N/A;  . EUA/ PAPSMEAR/  CERVICAL MASS BIOPSY  04-12-2018   dr peggy constant @[facility]   . IR IMAGING GUIDED PORT INSERTION  05/12/2018  . IR REMOVAL TUN  ACCESS W/ PORT W/O FL MOD SED  11/17/2018  . TANDEM RING INSERTION N/A 06/27/2018   Procedure: TANDEM RING INSERTION;  Surgeon: Gery Pray, MD;  Location[facility];  Service: Urology;  Laterality: N/A;  . TANDEM RING INSERTION N/A 07/04/2018   Procedure: TANDEM RING INSERTION;  Surgeon: Gery Pray, MD;  Location: [facility];  Service: Urology;  Laterality: N/A;  . TANDEM RING INSERTION N/A 07/14/2018   Procedure: TANDEM RING INSERTION;  Surgeon: Gery Pray, MD;  Location: [facility];  Service: Urology;  Laterality: N/A;  . TANDEM RING INSERTION N/A 07/18/2018   Procedure: TANDEM CYLINDER INSERTION;  Surgeon: Gery Pray, MD;  Location: [facility];  Service: Urology;  Laterality: N/A;  . TANDEM RING INSERTION N/A 07/27/2018   Procedure: TANDEM RING INSERTION;  Surgeon: Gery Pray, MD;  Location: [facility];  Service: Urology;  Laterality: N/A;  . WISDOM TOOTH EXTRACTION      Family History  Problem Relation Age of Onset  . Hypertension Mother   . Heart disease Neg Hx   . Cancer Neg Hx   . Diabetes Neg Hx     Social History   Tobacco Use  . Smoking status: Never Smoker  . Smokeless tobacco: Never Used  Substance Use Topics  . Alcohol use: No  . Drug use: No     No Known Allergies     Observations/Objective: No vital signs or physical exam conducted as visit was done via telephone  Assessment and Plan: 1. Malignant neoplasm of cervix, unspecified site ([place]) 2. History of cancer chemotherapy 3. History of radiation therapy On review of patient's chart and from telemedicine visit from patient, she has history of squamous cell carcinoma of the cervix which was successfully treated with chemotherapy and radiation therapy.  Patient had follow-up with oncology in May of this year and states that she believes that she has another visit scheduled in late December.  She reports no current  abdominal or pelvic pain, no vaginal bleeding.  She feels that she is doing well at this time.  4. History of anemia of chronic disease Patient with history of anemia of chronic disease with hemoglobin of 11.2 on 08/27/2019 which had improved to normal level of 12.4 on 11/17/2018.  She will likely have repeat CBC at her upcoming oncology visit later this year.  Patient has been asked to schedule office visit in April and additional blood work can be done at this time if not already done per oncology.  5. History of osteoporosis Patient with history of osteoporosis and discussed scheduling patient for bone density scan which she declines to do at this time.  Also discussed need for vitamin B12 and calcium supplement.  Patient states that she is currently drinking nutritional supplement shakes to help with her appetite but does not take any additional vitamins.  She was asked to consider vitamin D and calcium supplements due to her history of osteoporosis and will again mention the need for  bone density scan, mammogram and colonoscopy when patient is seen in the spring in follow-up.  6. Fatigue, unspecified type Patient is status post treatment for cervical cancer with radiation and chemotherapy and did have some decreased appetite/weight loss.  She reports that she is feeling better and is drinking nutritional supplements.  She does have history of anemia and osteoporosis therefore would suggest blood work such as CBC and vitamin D level.  If patient does not have these done when she has seen by her oncologist then will obtain this blood work as well as possible TSH if she continues to have fatigue when she is seen in the spring.  Follow Up Instructions:Return in about 5 months (around 12/17/2019) for chronic issues.    I discussed the assessment and treatment plan with the patient. The patient was provided an opportunity to ask questions and all were answered. The patient agreed with the plan and  demonstrated an understanding of the instructions.   The patient was advised to call back or seek an in-person evaluation if the symptoms worsen or if the condition fails to improve as anticipated.  I provided 12 minutes of non-face-to-face time during this encounter.   Antony Blackbird, MD

## 2019-09-05 ENCOUNTER — Inpatient Hospital Stay: Payer: Medicare Other | Attending: Obstetrics | Admitting: Gynecologic Oncology

## 2019-11-12 ENCOUNTER — Other Ambulatory Visit: Payer: Self-pay

## 2019-11-12 ENCOUNTER — Ambulatory Visit: Payer: Medicare Other | Attending: Internal Medicine

## 2019-11-12 DIAGNOSIS — Z23 Encounter for immunization: Secondary | ICD-10-CM | POA: Insufficient documentation

## 2019-11-12 NOTE — Progress Notes (Signed)
   Covid-19 Vaccination Clinic  Name:  Erin Kent    MRN: [id number] DOB: Aug 16, 1951  11/12/2019  [name] was observed post Covid-19 immunization for 15 minutes without incidence. She was provided with Vaccine Information Sheet and instruction to access the V-Safe system.   [name] was instructed to call 911 with any severe reactions post vaccine: Marland Kitchen Difficulty breathing  . Swelling of your face and throat  . A fast heartbeat  . A bad rash all over your body  . Dizziness and weakness    Immunizations Administered    Name Date Dose VIS Date Route   Pfizer COVID-19 Vaccine 11/12/2019  1:05 PM 0.3 mL 08/25/2019 Intramuscular   Manufacturer: [place]   Lot: [id number]   [place]: [id number]

## 2019-12-11 ENCOUNTER — Encounter: Payer: Self-pay | Admitting: Radiation Oncology

## 2019-12-11 ENCOUNTER — Other Ambulatory Visit: Payer: Self-pay

## 2019-12-11 ENCOUNTER — Ambulatory Visit
Admission: RE | Admit: 2019-12-11 | Discharge: 2019-12-11 | Disposition: A | Discharge status: Home / Self Care | Payer: Medicare Other | Source: Ambulatory Visit | Attending: Radiation Oncology | Admitting: Radiation Oncology

## 2019-12-11 VITALS — BP 154/78 | HR 67 | Temp 98.7°F | Resp 18 | Ht 62.0 in | Wt 89.2 lb

## 2019-12-11 DIAGNOSIS — C539 Malignant neoplasm of cervix uteri, unspecified: Secondary | ICD-10-CM

## 2019-12-11 DIAGNOSIS — Z923 Personal history of irradiation: Secondary | ICD-10-CM | POA: Insufficient documentation

## 2019-12-11 DIAGNOSIS — Z8541 Personal history of malignant neoplasm of cervix uteri: Secondary | ICD-10-CM | POA: Insufficient documentation

## 2019-12-11 LAB — CBC WITH DIFFERENTIAL (CANCER CENTER ONLY)
Abs Immature Granulocytes: 0.02 K/uL (ref 0.00–0.07)
Basophils Absolute: 0 K/uL (ref 0.0–0.1)
Basophils Relative: 1 %
Eosinophils Absolute: 0.1 K/uL (ref 0.0–0.5)
Eosinophils Relative: 1 %
HCT: 40 % (ref 36.0–46.0)
Hemoglobin: 12.9 g/dL (ref 12.0–15.0)
Immature Granulocytes: 0 %
Lymphocytes Relative: 13 %
Lymphs Abs: 0.6 K/uL — ABNORMAL LOW (ref 0.7–4.0)
MCH: 29.9 pg (ref 26.0–34.0)
MCHC: 32.3 g/dL (ref 30.0–36.0)
MCV: 92.6 fL (ref 80.0–100.0)
Monocytes Absolute: 0.5 K/uL (ref 0.1–1.0)
Monocytes Relative: 11 %
Neutro Abs: 3.4 K/uL (ref 1.7–7.7)
Neutrophils Relative %: 74 %
Platelet Count: 181 K/uL (ref 150–400)
RBC: 4.32 MIL/uL (ref 3.87–5.11)
RDW: 12.6 % (ref 11.5–15.5)
WBC Count: 4.6 K/uL (ref 4.0–10.5)
nRBC: 0 % (ref 0.0–0.2)

## 2019-12-11 LAB — CMP (CANCER CENTER ONLY)
ALT: 7 U/L (ref 0–44)
AST: 14 U/L — ABNORMAL LOW (ref 15–41)
Albumin: 4.1 g/dL (ref 3.5–5.0)
Alkaline Phosphatase: 85 U/L (ref 38–126)
Anion gap: 11 (ref 5–15)
BUN: 15 mg/dL (ref 8–23)
CO2: 25 mmol/L (ref 22–32)
Calcium: 9 mg/dL (ref 8.9–10.3)
Chloride: 107 mmol/L (ref 98–111)
Creatinine: 0.8 mg/dL (ref 0.44–1.00)
GFR, Est AFR Am: >60 mL/min
GFR, Estimated: >60 mL/min
Glucose, Bld: 83 mg/dL (ref 70–99)
Potassium: 4.1 mmol/L (ref 3.5–5.1)
Sodium: 143 mmol/L (ref 135–145)
Total Bilirubin: 0.4 mg/dL (ref 0.3–1.2)
Total Protein: 7.3 g/dL (ref 6.5–8.1)

## 2019-12-11 NOTE — Progress Notes (Signed)
  Radiation Oncology         [phone] ________________________________  Name: Erin Kent MRN: [id number]  Date: 12/11/2019  DOB: 1951-01-16  Follow-Up Visit Note  CC: Patient, [name]  Isabel Caprice, MD    ICD-10-CM   1. Malignant neoplasm of cervix, unspecified site ([place])  C53.9 CBC with Differential ([place])    [place] ([place] only)    Diagnosis: Stage IIIB (cT3b, cN1, cM0) malignant neoplasm of cervix  Interval Since Last Radiation: One year, four months, two weeks, and two days.  Radiation treatment dates: 05/17/2018 - 07/01/2018  HDR - 06/27/2018, 07/04/2018, 07/14/2018, 07/19/19/13/2019  Site/dose: 1. pelvis / 1.8 Gy x 25 fractions for a total dose of 45 Gy 2. pelvic Boost / 1.8 Gy x 5 fractions for a total dose of 9 Gy 3. Cervix / 5.5 Gy x 5 fractions for a total dose of 27.5 Gy  Narrative:  The patient returns today for routine follow-up. No significant interval history since last visit. Of note, the patient received her COVID vaccination on 11/12/2019.  On review of systems, she reports no pain and has no complaints at this time.   ALLERGIES:  has No Known Allergies.  Meds: Current Outpatient Medications  Medication Sig Dispense Refill  . Multiple Vitamin (MULTIVITAMIN) tablet Take 1 tablet by mouth daily.     No current facility-administered medications for this encounter.    Physical Findings: The patient is in no acute distress. Patient is alert and oriented.  height is 5\' 2"  (1.575 m) and weight is 89 lb 4 oz (40.5 kg). Her temporal temperature is 98.7 F (37.1 C). Her blood pressure is 154/78 (abnormal) and her pulse is 67. Her respiration is 18 and oxygen saturation is 99%.   Lungs are clear to auscultation bilaterally. Heart has regular rate and rhythm. No palpable cervical, supraclavicular, or axillary adenopathy. Abdomen soft, non-tender, normal bowel sounds. On pelvic examination the external genitalia were unremarkable. The  patient's vaginal vault was significantly narrowed and foreshortened but would permit 5th finger exam to approximately 3 cm. Unable to view the cervical os due to severe agglutination.  Unable to place a pediatric speculum. A rectal exam was performed revealing some induration to the right parametrial area.  Difficult to determine if this is a mass or fibrosis along the cervical area.  Lab Findings: Lab Results  Component Value Date   WBC 4.6 12/11/2019   HGB 12.9 12/11/2019   HCT 40.0 12/11/2019   MCV 92.6 12/11/2019   PLT 181 12/11/2019    Radiographic Findings: No results found.  Impression:  Stage IIIB (cT3b, cN1, cM0) malignant neoplasm of cervix.   Exam is compromised as above.  Given the question of induration along the cervical/parametrial area will order a CT scan of the abdomen and pelvis for further evaluation.  Plan: Patient will follow-up with radiation oncology in six months.  Gynecologic oncology in 3 months.  ___________________________________  Blair Promise, PhD, MD  This document serves as a record of services personally performed by Gery Pray, MD. It was created on his behalf by Clerance Lav, a trained medical scribe. The creation of this record is based on the scribe's personal observations and the provider's statements to them. This document has been checked and approved by the attending provider.

## 2019-12-11 NOTE — Patient Instructions (Signed)
Coronavirus (COVID-19) Are you at risk?  Are you at risk for the Coronavirus (COVID-19)?  To be considered HIGH RISK for Coronavirus (COVID-19), you have to meet the following criteria:  . Traveled to [place], [place], [place], [place] or [place]; or in the [place] to [place], [place], [place], or [place]; and have fever, cough, and shortness of breath within the last 2 weeks of travel OR . Been in close contact with a person diagnosed with COVID-19 within the last 2 weeks and have fever, cough, and shortness of breath . IF YOU DO NOT MEET THESE CRITERIA, YOU ARE CONSIDERED LOW RISK FOR COVID-19.  What to do if you are HIGH RISK for COVID-19?  . If you are having a medical emergency, call 911. . Seek medical care right away. Before you go to a doctor's office, urgent care or emergency department, call ahead and tell them about your recent travel, contact with someone diagnosed with COVID-19, and your symptoms. You should receive instructions from your physician's office regarding next steps of care.  . When you arrive at healthcare provider, tell the healthcare staff immediately you have returned from visiting [place], [place], [place], [place] or [place]; or traveled in the [place] to [place], [place], [place], or [place]; in the last two weeks or you have been in close contact with a person diagnosed with COVID-19 in the last 2 weeks.   . Tell the health care staff about your symptoms: fever, cough and shortness of breath. . After you have been seen by a medical provider, you will be either: o Tested for (COVID-19) and discharged home on quarantine except to seek medical care if symptoms worsen, and asked to  - Stay home and avoid contact with others until you get your results (4-5 days)  - Avoid travel on public transportation if possible (such as bus, train, or airplane) or o Sent to the Emergency Department by EMS for evaluation, COVID-19 testing, and possible  admission depending on your condition and test results.  What to do if you are LOW RISK for COVID-19?  Reduce your risk of any infection by using the same precautions used for avoiding the common cold or flu:  . Wash your hands often with soap and warm water for at least 20 seconds.  If soap and water are not readily available, use an alcohol-based hand sanitizer with at least 60% alcohol.  . If coughing or sneezing, cover your mouth and nose by coughing or sneezing into the elbow areas of your shirt or coat, into a tissue or into your sleeve (not your hands). . Avoid shaking hands with others and consider head nods or verbal greetings only. . Avoid touching your eyes, nose, or mouth with unwashed hands.  . Avoid close contact with people who are sick. . Avoid places or events with large numbers of people in one location, like concerts or sporting events. . Carefully consider travel plans you have or are making. . If you are planning any travel outside or inside the [place], visit the CDC's Travelers' Health webpage for the latest health notices. . If you have some symptoms but not all symptoms, continue to monitor at home and seek medical attention if your symptoms worsen. . If you are having a medical emergency, call 911.   ADDITIONAL HEALTHCARE OPTIONS FOR PATIENTS  [facility] / e-Visit: [email or web address]         [facility]: [phone]  [facility]: [phone]                   [facility]: [phone]   

## 2019-12-11 NOTE — Progress Notes (Signed)
Patient in for follow up last seen by Dr Sondra Come 6 months ago. Last CT was 04/2019. Denies any issues or complaints of at this time.

## 2019-12-13 ENCOUNTER — Ambulatory Visit: Payer: Medicare Other | Attending: Internal Medicine

## 2019-12-13 DIAGNOSIS — Z23 Encounter for immunization: Secondary | ICD-10-CM

## 2019-12-13 NOTE — Progress Notes (Signed)
   Covid-19 Vaccination Clinic  Name:  Erin Kent    MRN: [id number] DOB: 11/15/1950  12/13/2019  Erin Kent was observed post Covid-19 immunization for 15 minutes without incident. She was provided with Vaccine Information Sheet and instruction to access the V-Safe system.   Erin Kent was instructed to call 911 with any severe reactions post vaccine: Marland Kitchen Difficulty breathing  . Swelling of face and throat  . A fast heartbeat  . A bad rash all over body  . Dizziness and weakness   Immunizations Administered    Name Date Dose VIS Date Route   Pfizer COVID-19 Vaccine 12/13/2019 12:45 PM 0.3 mL 08/25/2019 Intramuscular   Manufacturer: [place]   Lot: [id number]   [place]: [id number]

## 2019-12-19 ENCOUNTER — Telehealth: Payer: Self-pay | Admitting: *Deleted

## 2019-12-19 NOTE — Telephone Encounter (Signed)
Appointment 03/08/2020 @ 2:00 pm w/ Dr. Berline Lopes confirmed w/ Ms. Rick's caregiver

## 2020-03-08 ENCOUNTER — Other Ambulatory Visit: Payer: Self-pay

## 2020-03-08 ENCOUNTER — Encounter: Payer: Self-pay | Admitting: Gynecologic Oncology

## 2020-03-08 ENCOUNTER — Inpatient Hospital Stay: Payer: Medicare Other | Attending: Gynecologic Oncology | Admitting: Gynecologic Oncology

## 2020-03-08 ENCOUNTER — Ambulatory Visit: Payer: Medicare Other

## 2020-03-08 VITALS — BP 140/77 | HR 72 | Temp 98.7°F | Resp 18 | Ht 62.0 in | Wt 88.0 lb

## 2020-03-08 DIAGNOSIS — Z79899 Other long term (current) drug therapy: Secondary | ICD-10-CM | POA: Insufficient documentation

## 2020-03-08 DIAGNOSIS — F329 Major depressive disorder, single episode, unspecified: Secondary | ICD-10-CM | POA: Diagnosis not present

## 2020-03-08 DIAGNOSIS — Z9221 Personal history of antineoplastic chemotherapy: Secondary | ICD-10-CM | POA: Diagnosis not present

## 2020-03-08 DIAGNOSIS — C539 Malignant neoplasm of cervix uteri, unspecified: Secondary | ICD-10-CM | POA: Insufficient documentation

## 2020-03-08 DIAGNOSIS — M8580 Other specified disorders of bone density and structure, unspecified site: Secondary | ICD-10-CM | POA: Diagnosis not present

## 2020-03-08 DIAGNOSIS — I700 Atherosclerosis of aorta: Secondary | ICD-10-CM | POA: Insufficient documentation

## 2020-03-08 DIAGNOSIS — Z923 Personal history of irradiation: Secondary | ICD-10-CM | POA: Diagnosis not present

## 2020-03-08 NOTE — Progress Notes (Signed)
Gynecologic Oncology Return Clinic Visit  03/08/20  Reason for Visit: surveillance visit in the setting of advanced SCC of the cervix  Treatment History: Oncology History Overview Note  Cancer Staging Malignant neoplasm of cervix ([place]) Staging form: Cervix Uteri, AJCC 8th Edition - Clinical: Stage IIIB (cT3b, cN1, cM0) - Signed by [name], MD on 05/04/2018     Malignant neoplasm of cervix ([place])  03/13/2018 Imaging   US pelvis There are mobile echogenic foci within the endometrial cavity suggestive of gas. This is nonspecific in etiology however may be secondary to endometritis. Correlate for history of recent instrumentation. This obscures visualization of the endometrial tissue and therefore a follow-up pelvic ultrasound in 2-4 weeks is recommended after resolution of the acute symptomatology if symptoms persist to further evaluate the endometrium.    03/13/2018 Initial Diagnosis   She initially presented with PMB   04/12/2018 Pathology Results   Cervix, biopsy, mass - INVASIVE SQUAMOUS CELL CARCINOMA - SEE COMMENT   04/12/2018 Surgery   PREOPERATIVE DIAGNOSIS:  Postmenopausal vaginal bleeding. POSTOPERATIVE DIAGNOSIS: The same PROCEDURE: Exam under anesthesia, pap smear, cervical mass biopsy SURGEON:  Dr. [name]  INDICATIONS: 69 y.o. yo G0P0000 with PMB here for exam under anesthesia.Risks of surgery were discussed with the patient including but not limited to: bleeding which may require transfusion; infection which may require antibiotics; injury to uterus or surrounding organs; need for additional procedures including laparotomy or laparoscopy; and other postoperative/anesthesia complications. Written informed consent was obtained.    FINDINGS:  An 8-week size midline uterus.  No adnexal mass palpable on exam. Normal cervix not visualized. Friable mass seen in involving the vagina at the level of the cervix obliterating the anterior and posterior  fornix.  ANESTHESIA:   General INTRAVENOUS FLUIDS:  300 ml of LR ESTIMATED BLOOD LOSS: 20 ml. SPECIMENS: pap smear, cervical mass biopsy COMPLICATIONS:  None immediate.    04/25/2018 PET scan   1. Large cervical mass may invade into the myometrium in down into the vagina, maximum SUV 21.3. There is a malignant left external iliac node measuring 1.4 cm in short axis with maximum SUV 14.1. 2. Accentuated activity in the cecum and ascending colon is likely physiologic given that it has no CT correlate. Correlation with the patient's colon cancer screening history is recommended. If screening is not up-to-date, appropriate screening should be considered. 3.  Aortic Atherosclerosis (ICD10-I70.0).    04/28/2018 Surgery   Pre-operative Diagnosis:  At least Stage 2B SCCa Cervical Cancer  Post-operative Diagnosis: At least clinical Stage 3A SCCa Cervical Cancer  Operation:  Exam under anesthesia due to intolerance for vaginal exam in office Cystoscopy due to concern for extension to posterior bladder (from anterior vaginal wall lesion)  Operative Findings:   1. Parametrial involvement on right  2. Right sided subvaginal tumor burden ~3cm. This approximates the right ureteral insertion into the bladder based on my exam during cystoscopy. 3. Extension of disease down upper half of vagina on lateral and posterior walls. 4. Extension of disease down to lower third of anterior vagina (versus skip lesion) thus making her Stage 3A 5. Cystoscopy revealed patent bilateral ureteral orifices and no bladder mucosa invasion or areas of concern. 6. Rectal exam grossly no disease.    05/04/2018 Cancer Staging   Staging form: Cervix Uteri, AJCC 8th Edition - Clinical: Stage IIIB (cT3b, cN1, cM0) - Signed by [name], MD on 05/04/2018   05/12/2018 Procedure   Successful 8 French right internal jugular vein power port placement  with its tip at the SVC/RA junction   05/13/2018 - 06/24/2018  Chemotherapy   The patient had weekly cisplatin given with concurrent radiation    05/17/2018 - 07/27/2018 Radiation Therapy   Radiation treatment dates:05/17/2018-07/27/2018  Site/dose:1. Cervix, 1.8 Gy in 25 fractions for a total dose of 45 Gy 2. Pelvis  Boost, 1.8 Gy in 5 fractions for a total dose of 9 Gy 3. Cervix,5.5Gy in 5 fractions for a total dose of 27.5Gy   06/03/2018 Adverse Reaction   She missed her chemo due to severe depression and was sent to the ER for suicidal ideation.  She was seen by psychiatrist.   10/27/2018 PET scan   IMPRESSION: 1. Complete metabolic response to therapy, without residual or recurrent hypermetabolic disease. 2.  Aortic Atherosclerosis (ICD10-I70.0).    11/17/2018 Imaging   Successful right IJ vein Port-A-Cath explant.     Interval History: Patient reports overall doing well.  She denies any abdominal or pelvic pain.  She denies vaginal bleeding or discharge.  She endorses normal bowel and bladder function.  She reports a good appetite and no recent change in her weight.  Saw Dr. Sondra Come last on 3/29. At that visit, induration was noted in the right parametrium. Given difficulty of exam due to vaginal agglutination, plan was for CT to evaluate whether this finding was a mass or fibrosis. Unfortunately that CT was never scheduled.  Did not make her appointment in gyn oncology in late December 2020.   S/p COVID vaccines.  Past Medical/Surgical History: Past Medical History:  Diagnosis Date  . Aortic atherosclerosis ([place])   . Diarrhea    chronic secondary to radiation  . History of cancer chemotherapy 05-13-2018 to 06-24-2018  . History of external beam radiation therapy    completed 06-24-2018  cervical cancer---  followed with   . Hypokalemia   . Hypomagnesemia   . Iron deficiency anemia   . Malignant neoplasm cervix [facility]) oncologist-- dr Clemon Chambers   dx 04-12-2018---  invasive SCC of cervix,   Stage IIIB1, (cT3b,cN1,cM0)-- completed chemotherapy and external beam radiation on 10-11-219--  started high dose radiation 06-22-2018 for 5 treatments  . MDD (major depressive disorder)    w/ psychosis hallucination episode 06-10-2018  . Osteopenia   . Ovarian cyst    Left  . Pancytopenia, acquired ([place])   . Poor appetite   . Port-A-Cath in place     Past Surgical History:  Procedure Laterality Date  . CYSTOSCOPY N/A 04/28/2018   Procedure: CYSTOSCOPY;  Surgeon: Isabel Caprice, MD;  [facility];  Service: Gynecology;  Laterality: N/A;  . EUA/ PAPSMEAR/  CERVICAL MASS BIOPSY  04-12-2018   dr peggy constant @[facility]   . IR IMAGING GUIDED PORT INSERTION  05/12/2018  . IR REMOVAL TUN ACCESS W/ PORT W/O FL MOD SED  11/17/2018  . TANDEM RING INSERTION N/A 06/27/2018   Procedure: TANDEM RING INSERTION;  Surgeon: Gery Pray, MD;  Location: [facility];  Service: Urology;  Laterality: N/A;  . TANDEM RING INSERTION N/A 07/04/2018   Procedure: TANDEM RING INSERTION;  Surgeon: Gery Pray, MD;  Location[facility];  Service: Urology;  Laterality: N/A;  . TANDEM RING INSERTION N/A 07/14/2018   Procedure: TANDEM RING INSERTION;  Surgeon: Gery Pray, MD;  Location: [facility];  Service: Urology;  Laterality: N/A;  . TANDEM RING INSERTION N/A 07/18/2018   Procedure: TANDEM CYLINDER INSERTION;  Surgeon: Gery Pray, MD;  Location: [facility];  Service: Urology;  Laterality: N/A;  . TANDEM RING INSERTION N/A 07/27/2018   Procedure: TANDEM RING INSERTION;  Surgeon: Gery Pray, MD;  Location: [facility];  Service: Urology;  Laterality: N/A;  . WISDOM TOOTH EXTRACTION      Family History  Problem Relation Age of Onset  . Hypertension Mother   . Heart disease Neg Hx   . Cancer Neg Hx   . Diabetes Neg Hx     Social History   Socioeconomic History  . Marital status: Single    Spouse name:  Not on file  . Number of children: 0  . Years of education: Not on file  . Highest education level: Not on file  Occupational History  . Not on file  Tobacco Use  . Smoking status: Never Smoker  . Smokeless tobacco: Never Used  Vaping Use  . Vaping Use: Never used  Substance and Sexual Activity  . Alcohol use: No  . Drug use: No  . Sexual activity: Not Currently    Birth control/protection: None, Post-menopausal  Other Topics Concern  . Not on file  Social History Narrative  . Not on file   Social Determinants of Health   Financial Resource Strain:   . Difficulty of Paying Living Expenses:   Food Insecurity:   . Worried About Charity fundraiser in the Last Year:   . Arboriculturist in the Last Year:   Transportation Needs:   . Film/video editor (Medical):   Marland Kitchen Lack of Transportation (Non-Medical):   Physical Activity:   . Days of Exercise per Week:   . Minutes of Exercise per Session:   Stress:   . Feeling of Stress :   Social Connections:   . Frequency of Communication with Friends and Family:   . Frequency of Social Gatherings with Friends and Family:   . Attends Religious Services:   . Active Member of Clubs or Organizations:   . Attends Archivist Meetings:   Marland Kitchen Marital Status:     Current Medications:  Current Outpatient Medications:  Marland Kitchen  Multiple Vitamin (MULTIVITAMIN) tablet, Take 1 tablet by mouth daily., Disp: , Rfl:   Review of Systems: Denies appetite changes, fevers, chills, fatigue, unexplained weight changes. Denies hearing loss, neck lumps or masses, mouth sores, ringing in ears or voice changes. Denies cough or wheezing.  Denies shortness of breath. Denies chest pain or palpitations. Denies leg swelling. Denies abdominal distention, pain, blood in stools, constipation, diarrhea, nausea, vomiting, or early satiety. Denies pain with intercourse, dysuria, frequency, hematuria or incontinence. Denies hot flashes, pelvic pain, vaginal  bleeding or vaginal discharge.   Denies joint pain, back pain or muscle pain/cramps. Denies itching, rash, or wounds. Denies dizziness, headaches, numbness or seizures. Denies swollen lymph nodes or glands, denies easy bruising or bleeding. Denies anxiety, depression, confusion, or decreased concentration.  Physical Exam: BP 140/77 (BP Location: Left Arm, Patient Position: Sitting)   Pulse 72   Temp 98.7 F (37.1 C) (Oral)   Resp 18   Ht 5\' 2"  (1.575 m)   Wt 88 lb (39.9 kg)   SpO2 100%   BMI 16.10 kg/m  General: Alert, oriented, no acute distress.  Somewhat cachectic appearing. HEENT: Normocephalic, atraumatic, sclera anicteric. Chest: Clear to auscultation bilaterally.   Abdomen: soft, nontender.  Normoactive bowel sounds.  No masses or hepatosplenomegaly appreciated.   Extremities: Grossly normal range of motion.  Warm, well perfused.  No edema bilaterally.  1 cm raised  and bruised area where patient states she had a splinter recently. Skin: No rashes or lesions noted. Lymphatics: No cervical, supraclavicular, or inguinal adenopathy. GU: Normal appearing external genitalia without erythema, excoriation, or lesions.  Attempted to place a pediatric speculum, which could not be inserted.  A 1 finger digital exam with my pinky was performed with agglutination noted approximately 2 cm within the introitus.  Rectal exam reveals induration posteriorly and to the right of the cervix, no nodularity noted.  Laboratory & Radiologic Studies: None new  Assessment & Plan: Erin Kent is a 69 y.o. woman with stage IIIB cervical carcinoma in August 2019.  She completed definitive therapy with chemoradiation under the care of Drs. Kinard and Alvy Bimler and she had her last HDR in 07/2018.  She had a negative post-treatment PET imaging in February 2020 that showed no evidence of recurrent disease and complete metabolic resolution.  The patient was never scheduled for CT scan to evaluate her findings  on pelvic exam.  This was done today.  Given her very limited pelvic exam, I discussed again that we will have to rely on imaging for posttreatment surveillance.  Just as exams are significantly limited, we will not be able to do yearly Pap test as we would normally for surveillance in this setting.  Discussed that either Dr. Sondra Come or I will call her with the results of her upcoming CT scan.  She is scheduled to see him in September and will see me again in December.  We discussed signs and symptoms that would be concerning for recurrence and she knows to call the clinic if she develops any of these before her next scheduled visit.  22 minutes of total time was spent for this patient encounter, including preparation, face-to-face counseling with the patient and coordination of care, and documentation of the encounter.  Jeral Pinch, MD  Division of Gynecologic Oncology  Department of Obstetrics and Gynecology  [facility]

## 2020-03-08 NOTE — Patient Instructions (Signed)
Either Dr. Sondra Come or I will call you with your CT scan results once we have them.  As long as the CT scan does not show anything concerning, then he will see you in September and I will see you back in December.  If you develop any new symptoms, such as vaginal bleeding, discharge, or pelvic pain, please call my office to be seen sooner at [phone].

## 2020-03-11 ENCOUNTER — Other Ambulatory Visit: Payer: Self-pay

## 2020-03-11 ENCOUNTER — Inpatient Hospital Stay: Payer: Medicare Other

## 2020-03-11 DIAGNOSIS — C539 Malignant neoplasm of cervix uteri, unspecified: Secondary | ICD-10-CM

## 2020-03-11 LAB — BASIC METABOLIC PANEL - CANCER CENTER ONLY
Anion gap: 9 (ref 5–15)
BUN: 11 mg/dL (ref 8–23)
CO2: 28 mmol/L (ref 22–32)
Calcium: 8.9 mg/dL (ref 8.9–10.3)
Chloride: 106 mmol/L (ref 98–111)
Creatinine: 0.83 mg/dL (ref 0.44–1.00)
GFR, Est AFR Am: >60 mL/min (ref >60)
GFR, Estimated: >60 mL/min (ref >60)
Glucose, Bld: 87 mg/dL (ref 70–99)
Potassium: 4.1 mmol/L (ref 3.5–5.1)
Sodium: 143 mmol/L (ref 135–145)

## 2020-03-14 ENCOUNTER — Ambulatory Visit (HOSPITAL_COMMUNITY)
Admission: RE | Admit: 2020-03-14 | Discharge: 2020-03-14 | Disposition: A | Discharge status: Home / Self Care | Payer: Medicare Other | Source: Ambulatory Visit | Attending: Radiation Oncology | Admitting: Radiation Oncology

## 2020-03-14 ENCOUNTER — Other Ambulatory Visit: Payer: Self-pay

## 2020-03-14 ENCOUNTER — Encounter (HOSPITAL_COMMUNITY): Payer: Self-pay

## 2020-03-14 ENCOUNTER — Other Ambulatory Visit: Payer: Self-pay | Admitting: Radiation Oncology

## 2020-03-14 DIAGNOSIS — C539 Malignant neoplasm of cervix uteri, unspecified: Secondary | ICD-10-CM

## 2020-03-14 MED ORDER — IOHEXOL 300 MG/ML  SOLN
100.0000 mL | Freq: Once | INTRAMUSCULAR | Status: AC | PRN
  Administered 2020-03-14: 80 mL via INTRAVENOUS

## 2020-03-14 MED ORDER — SODIUM CHLORIDE (PF) 0.9 % IJ SOLN
INTRAMUSCULAR | Status: AC
  Filled 2020-03-14: qty 50

## 2020-03-15 ENCOUNTER — Telehealth: Payer: Self-pay | Admitting: Gynecologic Oncology

## 2020-03-15 NOTE — Telephone Encounter (Signed)
Called the patient and discussed recent CT scan, that is concerning for possible disease recurrence in the right parametrium.  PET scan was ordered by Dr. Randa Ngo to further evaluate this.  Patient now aware of date and time of the PET scan.  She knows that [place] of [place] will call her with the results and to discuss any further work-up or treatment necessary.  Jeral Pinch MD Gynecologic Oncology

## 2020-03-29 ENCOUNTER — Other Ambulatory Visit: Payer: Self-pay

## 2020-03-29 ENCOUNTER — Encounter (HOSPITAL_COMMUNITY)
Admission: RE | Admit: 2020-03-29 | Discharge: 2020-03-29 | Disposition: A | Discharge status: Home / Self Care | Payer: Medicare Other | Source: Ambulatory Visit | Attending: Radiation Oncology | Admitting: Radiation Oncology

## 2020-03-29 DIAGNOSIS — C539 Malignant neoplasm of cervix uteri, unspecified: Secondary | ICD-10-CM | POA: Diagnosis not present

## 2020-03-29 DIAGNOSIS — I700 Atherosclerosis of aorta: Secondary | ICD-10-CM | POA: Insufficient documentation

## 2020-03-29 LAB — GLUCOSE, CAPILLARY: Glucose-Capillary: 100 mg/dL — ABNORMAL HIGH (ref 70–99)

## 2020-03-29 MED ORDER — FLUDEOXYGLUCOSE F - 18 (FDG) INJECTION
4.6000 | Freq: Once | INTRAVENOUS | Status: AC | PRN
  Administered 2020-03-29: 4.6 via INTRAVENOUS

## 2020-04-08 ENCOUNTER — Other Ambulatory Visit: Payer: Self-pay | Admitting: Oncology

## 2020-04-08 ENCOUNTER — Telehealth: Payer: Self-pay | Admitting: Oncology

## 2020-04-08 NOTE — Progress Notes (Signed)
Gynecologic Oncology Multi-Disciplinary Disposition Conference Note  Date of the Conference: 04/08/2020  Patient Name: Erin Kent  Referring Provider: Dr. Elly Modena Primary GYN Oncologist: Dr. Berline Lopes  Stage/Disposition:  Stage IIB squamous cell carcinoma of the cervix with a local recurrence to the right vaginal cuff. Disposition is to consideration for chemotherapy.   This Multidisciplinary conference took place involving physicians from [place], [place], Radiation Oncology, Pathology, Radiology along with the Gynecologic Oncology Nurse Practitioner and RN.  Comprehensive assessment of the patient's malignancy, staging, need for surgery, chemotherapy, radiation therapy, and need for further testing were reviewed. Supportive measures, both inpatient and following discharge were also discussed. The recommended plan of care is documented. Greater than 35 minutes were spent correlating and coordinating this patient's care.

## 2020-04-08 NOTE — Telephone Encounter (Signed)
Called Erin Kent and scheduled appointment to see Dr. Alvy Bimler on 04/19/20 at 12 pm.  Advised her to bring a friend/family member to the appointment.  She verbalized understanding.

## 2020-04-19 ENCOUNTER — Inpatient Hospital Stay: Payer: Medicare Other | Attending: Gynecologic Oncology | Admitting: Hematology and Oncology

## 2020-04-19 ENCOUNTER — Encounter: Payer: Self-pay | Admitting: Oncology

## 2020-04-19 ENCOUNTER — Other Ambulatory Visit: Payer: Self-pay

## 2020-04-19 ENCOUNTER — Encounter: Payer: Self-pay | Admitting: Hematology and Oncology

## 2020-04-19 VITALS — BP 138/66 | HR 71 | Temp 98.2°F | Resp 18 | Ht 62.0 in | Wt 88.4 lb

## 2020-04-19 DIAGNOSIS — Z9221 Personal history of antineoplastic chemotherapy: Secondary | ICD-10-CM | POA: Insufficient documentation

## 2020-04-19 DIAGNOSIS — Z7189 Other specified counseling: Secondary | ICD-10-CM

## 2020-04-19 DIAGNOSIS — Z5111 Encounter for antineoplastic chemotherapy: Secondary | ICD-10-CM | POA: Insufficient documentation

## 2020-04-19 DIAGNOSIS — Z79899 Other long term (current) drug therapy: Secondary | ICD-10-CM | POA: Diagnosis not present

## 2020-04-19 DIAGNOSIS — C539 Malignant neoplasm of cervix uteri, unspecified: Secondary | ICD-10-CM

## 2020-04-19 DIAGNOSIS — I700 Atherosclerosis of aorta: Secondary | ICD-10-CM | POA: Insufficient documentation

## 2020-04-19 DIAGNOSIS — R64 Cachexia: Secondary | ICD-10-CM

## 2020-04-19 DIAGNOSIS — Z923 Personal history of irradiation: Secondary | ICD-10-CM | POA: Diagnosis not present

## 2020-04-19 MED ORDER — ONDANSETRON HCL 8 MG PO TABS: 8.0000 mg | ORAL_TABLET | Freq: Three times a day (TID) | ORAL | 1 refills | Status: DC | PRN

## 2020-04-19 MED ORDER — PROCHLORPERAZINE MALEATE 10 MG PO TABS: 10.0000 mg | ORAL_TABLET | Freq: Four times a day (QID) | ORAL | 1 refills | Status: DC | PRN

## 2020-04-19 NOTE — Progress Notes (Signed)
Requested PD-L1 testing on accession [id number] with [facility].

## 2020-04-19 NOTE — Assessment & Plan Note (Signed)
She understood the goals of care is palliative in nature I recommend her healthcare power of attorney to get advanced directives and living will

## 2020-04-19 NOTE — Progress Notes (Signed)
[facility] OFFICE PROGRESS NOTE  Patient Care Team: Patient, [name] as PCP - General (General Practice)  ASSESSMENT & PLAN:  Malignant neoplasm of cervix (HCC) I have reviewed CT imaging and PET CT scan result with the patient and her caregiver Unfortunately, her imaging studies are highly suggestive of cancer recurrence Due to previous poor tolerance to physical examination, we do not feel that biopsy is indicated I will get pathologist to add PD-L1 testing to her prior biopsy to see if pembrolizumab would be a good choice in the future if she progressed on chemotherapy Given her malnourished state, I do not feel that combination chemotherapy is appropriate  We reviewed the current NCCN guidelines We discussed the role of chemotherapy. The intent is of palliative intent.  We discussed some of the risks, benefits, side-effects of carboplatin  Some of the short term side-effects included, though not limited to, including weight loss, life threatening infections, risk of allergic reactions, need for transfusions of blood products, nausea, vomiting, change in bowel habits, loss of hair, admission to hospital for various reasons, and risks of death.   Long term side-effects are also discussed including risks of infertility, permanent damage to nerve function, hearing loss, chronic fatigue, kidney damage with possibility needing hemodialysis, and rare secondary malignancy including bone marrow disorders.  The patient is aware that the response rates discussed earlier is not guaranteed.  After a long discussion, patient made an informed decision to proceed with the prescribed plan of care.   Patient education material was dispensed. I do not believe she needs port placement We will start with chemotherapy, AUC of 5 next week I will see her prior to cycle 2 of treatment for toxicity review I recommend minimum 4 cycles of treatment before repeating imaging  study    [facility]) Despite multiple counseling and significant work-up in the past, the patient is not able to gain weight We discussed the importance of adequate intake I recommend frequent small meals  Goals of care, counseling/discussion She understood the goals of care is palliative in nature I recommend her healthcare power of attorney to get advanced directives and living will   Orders Placed This Encounter  Procedures  . CBC with Differential (Cancer Center Only)    Standing Status:   Standing    Number of Occurrences:   20    Standing Expiration Date:   04/19/2021  . Comprehensive metabolic panel    Standing Status:   Standing    Number of Occurrences:   33    Standing Expiration Date:   04/19/2021    All questions were answered. The patient knows to call the clinic with any problems, questions or concerns. The total time spent in the appointment was 40 minutes encounter with patients including review of chart and various tests results, discussions about plan of care and coordination of care plan   Artis Delay, MD 04/19/2020 12:50 PM  INTERVAL HISTORY: Please see below for problem oriented charting. She returns with her caregiver for further follow-up Since last time I saw her, she denies pelvic pain, vaginal discharge or bleeding Her appetite is poor She denies recent weight loss  SUMMARY OF ONCOLOGIC HISTORY: Oncology History Overview Note  Cancer Staging Malignant neoplasm of cervix (HCC) Staging form: Cervix Uteri, AJCC 8th Edition - Clinical: Stage IIIB (cT3b, cN1, cM0) - Signed by Artis Delay, MD on 05/04/2018     Malignant neoplasm of cervix (HCC)  03/13/2018 Imaging   US pelvis  There are mobile echogenic foci within the endometrial cavity suggestive of gas. This is nonspecific in etiology however may be secondary to endometritis. Correlate for history of recent instrumentation. This obscures visualization of the endometrial tissue and therefore a  follow-up pelvic ultrasound in 2-4 weeks is recommended after resolution of the acute symptomatology if symptoms persist to further evaluate the endometrium.    03/13/2018 Initial Diagnosis   She initially presented with PMB   04/12/2018 Pathology Results   Cervix, biopsy, mass - INVASIVE SQUAMOUS CELL CARCINOMA - SEE COMMENT   04/12/2018 Surgery   PREOPERATIVE DIAGNOSIS:  Postmenopausal vaginal bleeding. POSTOPERATIVE DIAGNOSIS: The same PROCEDURE: Exam under anesthesia, pap smear, cervical mass biopsy SURGEON:  Dr. [name]  INDICATIONS: 69 y.o. yo [id number] with PMB here for exam under anesthesia.Risks of surgery were discussed with the patient including but not limited to: bleeding which may require transfusion; infection which may require antibiotics; injury to uterus or surrounding organs; need for additional procedures including laparotomy or laparoscopy; and other postoperative/anesthesia complications. Written informed consent was obtained.    FINDINGS:  An 8-week size midline uterus.  No adnexal mass palpable on exam. Normal cervix not visualized. Friable mass seen in involving the vagina at the level of the cervix obliterating the anterior and posterior fornix.  ANESTHESIA:   General INTRAVENOUS FLUIDS:  300 ml of LR ESTIMATED BLOOD LOSS: 20 ml. SPECIMENS: pap smear, cervical mass biopsy COMPLICATIONS:  None immediate.    04/25/2018 PET scan   1. Large cervical mass may invade into the myometrium in down into the vagina, maximum SUV 21.3. There is a malignant left external iliac node measuring 1.4 cm in short axis with maximum SUV 14.1. 2. Accentuated activity in the cecum and ascending colon is likely physiologic given that it has no CT correlate. Correlation with the patient's colon cancer screening history is recommended. If screening is not up-to-date, appropriate screening should be considered. 3.  Aortic Atherosclerosis (ICD10-I70.0).    04/28/2018 Surgery    Pre-operative Diagnosis:  At least Stage 2B SCCa Cervical Cancer  Post-operative Diagnosis: At least clinical Stage 3A SCCa Cervical Cancer  Operation:  Exam under anesthesia due to intolerance for vaginal exam in office Cystoscopy due to concern for extension to posterior bladder (from anterior vaginal wall lesion)  Operative Findings:   1. Parametrial involvement on right  2. Right sided subvaginal tumor burden ~3cm. This approximates the right ureteral insertion into the bladder based on my exam during cystoscopy. 3. Extension of disease down upper half of vagina on lateral and posterior walls. 4. Extension of disease down to lower third of anterior vagina (versus skip lesion) thus making her Stage 3A 5. Cystoscopy revealed patent bilateral ureteral orifices and no bladder mucosa invasion or areas of concern. 6. Rectal exam grossly no disease.    05/04/2018 Cancer Staging   Staging form: Cervix Uteri, AJCC 8th Edition - Clinical: Stage IIIB (cT3b, cN1, cM0) - Signed by [name], MD on 05/04/2018   05/12/2018 Procedure   Successful 8 French right internal jugular vein power port placement with its tip at the SVC/RA junction   05/13/2018 - 06/24/2018 Chemotherapy   The patient had weekly cisplatin given with concurrent radiation    05/17/2018 - 07/27/2018 Radiation Therapy   Radiation treatment dates:05/17/2018-07/27/2018  Site/dose:1. Cervix, 1.8 Gy in 25 fractions for a total dose of 45 Gy 2. Pelvis  Boost, 1.8 Gy in 5 fractions for a total dose of 9 Gy 3. Cervix,5.5Gy in 5 fractions for  a total dose of 27.5Gy   06/03/2018 Adverse Reaction   She missed her chemo due to severe depression and was sent to the ER for suicidal ideation.  She was seen by psychiatrist.   10/27/2018 PET scan   IMPRESSION: 1. Complete metabolic response to therapy, without residual or recurrent hypermetabolic disease. 2.  Aortic Atherosclerosis  (ICD10-I70.0).    11/17/2018 Imaging   Successful right IJ vein Port-A-Cath explant.   03/29/2020 PET scan   1. Hypermetabolic lesion along the right vaginal cuff is highly worrisome for recurrent cervical cancer. No evidence of distant metastatic disease. 2.  Aortic atherosclerosis (ICD10-I70.0).   04/26/2020 -  Chemotherapy   The patient had palonosetron (ALOXI) injection 0.25 mg, 0.25 mg, Intravenous,  Once, 0 of 4 cycles CARBOplatin (PARAPLATIN) 330 mg in sodium chloride 0.9 % 100 mL chemo infusion, 330 mg (100 % of original dose 333.183 mg), Intravenous,  Once, 0 of 4 cycles Dose modification:   (original dose 333.183 mg, Cycle 1), 333.183 mg (original dose 333.183 mg, Cycle 1) fosaprepitant (EMEND) 150 mg in sodium chloride 0.9 % 145 mL IVPB, 150 mg, Intravenous,  Once, 0 of 4 cycles  for chemotherapy treatment.      REVIEW OF SYSTEMS:   Constitutional: Denies fevers, chills or abnormal weight loss Eyes: Denies blurriness of vision Ears, nose, mouth, throat, and face: Denies mucositis or sore throat Respiratory: Denies cough, dyspnea or wheezes Cardiovascular: Denies palpitation, chest discomfort or lower extremity swelling Gastrointestinal:  Denies nausea, heartburn or change in bowel habits Skin: Denies abnormal skin rashes Lymphatics: Denies new lymphadenopathy or easy bruising Neurological:Denies numbness, tingling or new weaknesses Behavioral/Psych: Mood is stable, no new changes  All other systems were reviewed with the patient and are negative.  I have reviewed the past medical history, past surgical history, social history and family history with the patient and they are unchanged from previous note.  ALLERGIES:  has No Known Allergies.  MEDICATIONS:  Current Outpatient Medications  Medication Sig Dispense Refill  . Multiple Vitamin (MULTIVITAMIN) tablet Take 1 tablet by mouth daily.    . ondansetron (ZOFRAN) 8 MG tablet Take 1 tablet (8 mg total) by mouth every 8  (eight) hours as needed for refractory nausea / vomiting. Start on day 3 after carboplatin chemo. 30 tablet 1  . prochlorperazine (COMPAZINE) 10 MG tablet Take 1 tablet (10 mg total) by mouth every 6 (six) hours as needed (Nausea or vomiting). 30 tablet 1   No current facility-administered medications for this visit.    PHYSICAL EXAMINATION: ECOG PERFORMANCE STATUS: 1 - Symptomatic but completely ambulatory  Vitals:   04/19/20 1157  BP: 138/66  Pulse: 71  Resp: 18  Temp: 98.2 F (36.8 C)  SpO2: 100%   Filed Weights   04/19/20 1157  Weight: 88 lb 6.4 oz (40.1 kg)    GENERAL:alert, no distress and comfortable.  She looks thin and cachectic SKIN: skin color, texture, turgor are normal, no rashes or significant lesions EYES: normal, Conjunctiva are pink and non-injected, sclera clear OROPHARYNX:no exudate, no erythema and lips, buccal mucosa, and tongue normal  NECK: supple, thyroid normal size, non-tender, without nodularity LYMPH:  no palpable lymphadenopathy in the cervical, axillary or inguinal LUNGS: clear to auscultation and percussion with normal breathing effort HEART: regular rate & rhythm and no murmurs and no lower extremity edema ABDOMEN:abdomen soft, non-tender and normal bowel sounds Musculoskeletal:no cyanosis of digits and no clubbing  NEURO: alert & oriented x 3 with fluent speech, no focal  motor/sensory deficits  LABORATORY DATA:  I have reviewed the data as listed    Component Value Date/Time   NA 143 03/11/2020 0951   K 4.1 03/11/2020 0951   CL 106 03/11/2020 0951   CO2 28 03/11/2020 0951   GLUCOSE 87 03/11/2020 0951   BUN 11 03/11/2020 0951   CREATININE 0.83 03/11/2020 0951   CALCIUM 8.9 03/11/2020 0951   PROT 7.3 12/11/2019 1141   ALBUMIN 4.1 12/11/2019 1141   AST 14 (L) 12/11/2019 1141   ALT 7 12/11/2019 1141   ALKPHOS 85 12/11/2019 1141   BILITOT 0.4 12/11/2019 1141   GFRNONAA >60 03/11/2020 0951   GFRAA >60 03/11/2020 0951    No results  found for: SPEP, UPEP  Lab Results  Component Value Date   WBC 4.6 12/11/2019   NEUTROABS 3.4 12/11/2019   HGB 12.9 12/11/2019   HCT 40.0 12/11/2019   MCV 92.6 12/11/2019   PLT 181 12/11/2019      Chemistry      Component Value Date/Time   NA 143 03/11/2020 0951   K 4.1 03/11/2020 0951   CL 106 03/11/2020 0951   CO2 28 03/11/2020 0951   BUN 11 03/11/2020 0951   CREATININE 0.83 03/11/2020 0951      Component Value Date/Time   CALCIUM 8.9 03/11/2020 0951   ALKPHOS 85 12/11/2019 1141   AST 14 (L) 12/11/2019 1141   ALT 7 12/11/2019 1141   BILITOT 0.4 12/11/2019 1141       RADIOGRAPHIC STUDIES: I have personally reviewed the radiological images as listed and agreed with the findings in the report. NM PET Image Restag (PS) Skull Base To Thigh  Result Date: 04/01/2020 CLINICAL DATA:  Subsequent treatment strategy for cervical cancer. EXAM: NUCLEAR MEDICINE PET SKULL BASE TO THIGH TECHNIQUE: 4.6 mCi F-18 FDG was injected intravenously. Full-ring PET imaging was performed from the skull base to thigh after the radiotracer. CT data was obtained and used for attenuation correction and anatomic localization. Fasting blood glucose: 100 mg/dl COMPARISON:  CT abdomen pelvis 03/14/2020 and PET 10/27/2018. FINDINGS: Mediastinal blood pool activity: SUV max 2.2 Liver activity: SUV max NA NECK: Mildly asymmetric right-sided tonsillar uptake, likely physiologic. No hypermetabolic lymph nodes. Incidental CT findings: None. CHEST: No hypermetabolic mediastinal, hilar or axillary lymph nodes. No hypermetabolic pulmonary nodules. Incidental CT findings: Atherosclerotic calcification of the aorta and aortic valve. Heart is enlarged. No pericardial or pleural effusion. Lungs are grossly clear. ABDOMEN/PELVIS: Thick-walled cystic structure along the right vaginal cuff measures 1.8 x 2.6 cm (4/164), as on recent CT pelvis, and has an SUV max of 11.0. No abnormal hypermetabolism in the liver, adrenal glands,  spleen or pancreas. No hypermetabolic lymph nodes. Incidental CT findings: Liver, gallbladder, adrenal glands, kidneys, spleen, pancreas, stomach and bowel are grossly unremarkable. Atherosclerotic calcification of the aorta. SKELETON: No abnormal osseous hypermetabolism. Incidental CT findings: Old sacral insufficiency fractures. L5 compression fracture is better seen on prior imaging. IMPRESSION: 1. Hypermetabolic lesion along the right vaginal cuff is highly worrisome for recurrent cervical cancer. No evidence of distant metastatic disease. 2.  Aortic atherosclerosis (ICD10-I70.0). Electronically Signed   By: Lorin Picket M.D.   On: 04/01/2020 12:25

## 2020-04-19 NOTE — Progress Notes (Signed)
DISCONTINUE OFF PATHWAY REGIMEN - [Other Dx]   [id number]:Cisplatin 40 mg/m2 IV D1 q7 Days + RT:   A cycle is every 7 days:     Cisplatin   **Always confirm dose/schedule in your pharmacy ordering system**  REASON: Other Reason PRIOR TREATMENT: Cisplatin 40 mg/m2 IV D1 q7 Days + RT TREATMENT RESPONSE: Complete Response (CR)  START OFF PATHWAY REGIMEN - Other   [id number]:Carboplatin AUC=5 q21 Days:   A cycle is every 21 days:     Carboplatin   **Always confirm dose/schedule in your pharmacy ordering system**  Patient Characteristics: Intent of Therapy: Non-Curative / Palliative Intent, Discussed with Patient

## 2020-04-19 NOTE — Assessment & Plan Note (Signed)
I have reviewed CT imaging and PET CT scan result with the patient and her caregiver Unfortunately, her imaging studies are highly suggestive of cancer recurrence Due to previous poor tolerance to physical examination, we do not feel that biopsy is indicated I will get pathologist to add PD-L1 testing to her prior biopsy to see if pembrolizumab would be a good choice in the future if she progressed on chemotherapy Given her malnourished state, I do not feel that combination chemotherapy is appropriate  We reviewed the current NCCN guidelines We discussed the role of chemotherapy. The intent is of palliative intent.  We discussed some of the risks, benefits, side-effects of carboplatin  Some of the short term side-effects included, though not limited to, including weight loss, life threatening infections, risk of allergic reactions, need for transfusions of blood products, nausea, vomiting, change in bowel habits, loss of hair, admission to hospital for various reasons, and risks of death.   Long term side-effects are also discussed including risks of infertility, permanent damage to nerve function, hearing loss, chronic fatigue, kidney damage with possibility needing hemodialysis, and rare secondary malignancy including bone marrow disorders.  The patient is aware that the response rates discussed earlier is not guaranteed.  After a long discussion, patient made an informed decision to proceed with the prescribed plan of care.   Patient education material was dispensed. I do not believe she needs port placement We will start with chemotherapy, AUC of 5 next week I will see her prior to cycle 2 of treatment for toxicity review I recommend minimum 4 cycles of treatment before repeating imaging study

## 2020-04-19 NOTE — Assessment & Plan Note (Signed)
Despite multiple counseling and significant work-up in the past, the patient is not able to gain weight We discussed the importance of adequate intake I recommend frequent small meals 

## 2020-04-22 NOTE — Progress Notes (Signed)
Pharmacist Chemotherapy Monitoring - Initial Assessment    Anticipated start date: 04/26/20  Regimen:  . Are orders appropriate based on the patient's diagnosis, regimen, and cycle? Yes . Does the plan date match the patient's scheduled date? Yes . Is the sequencing of drugs appropriate? Yes . Are the premedications appropriate for the patient's regimen? Yes . Prior Authorization for treatment is: Approved o If applicable, is the correct biosimilar selected based on the patient's insurance? not applicable  Organ Function and Labs: Marland Kitchen Are dose adjustments needed based on the patient's renal function, hepatic function, or hematologic function? No . Are appropriate labs ordered prior to the start of patient's treatment? Yes . Other organ system assessment, if indicated: N/A . The following baseline labs, if indicated, have been ordered: N/A  Dose Assessment: . Are the drug doses appropriate? Yes . Are the following correct: o Drug concentrations Yes o IV fluid compatible with drug Yes o Administration routes Yes o Timing of therapy Yes . If applicable, does the patient have documented access for treatment and/or plans for port-a-cath placement? yes . If applicable, have lifetime cumulative doses been properly documented and assessed? not applicable Lifetime Dose Tracking  No doses have been documented on this patient for the following tracked chemicals: Doxorubicin, Epirubicin, Idarubicin, Daunorubicin, Mitoxantrone, Bleomycin, Oxaliplatin, Carboplatin, Liposomal Doxorubicin  o   Toxicity Monitoring/Prevention: . The patient has the following take home antiemetics prescribed: Ondansetron and Prochlorperazine . The patient has the following take home medications prescribed: N/A . Medication allergies and previous infusion related reactions, if applicable, have been reviewed and addressed. Yes . The patient's current medication list has been assessed for drug-drug interactions with their  chemotherapy regimen. no significant drug-drug interactions were identified on review.  Order Review: . Are the treatment plan orders signed? Yes . Is the patient scheduled to see a provider prior to their treatment? Yes  I verify that I have reviewed each item in the above checklist and answered each question accordingly.  Adelina Mings 04/22/2020 12:54 PM

## 2020-04-26 ENCOUNTER — Inpatient Hospital Stay: Payer: Medicare Other

## 2020-04-26 ENCOUNTER — Other Ambulatory Visit: Payer: Self-pay

## 2020-04-26 VITALS — BP 126/72 | HR 75 | Temp 98.0°F | Resp 18 | Ht 62.0 in | Wt 87.2 lb

## 2020-04-26 DIAGNOSIS — Z5111 Encounter for antineoplastic chemotherapy: Secondary | ICD-10-CM | POA: Diagnosis not present

## 2020-04-26 DIAGNOSIS — Z7189 Other specified counseling: Secondary | ICD-10-CM

## 2020-04-26 DIAGNOSIS — C539 Malignant neoplasm of cervix uteri, unspecified: Secondary | ICD-10-CM

## 2020-04-26 LAB — CBC WITH DIFFERENTIAL (CANCER CENTER ONLY)
Abs Immature Granulocytes: 0.02 10*3/uL (ref 0.00–0.07)
Basophils Absolute: 0.1 10*3/uL (ref 0.0–0.1)
Basophils Relative: 1 %
Eosinophils Absolute: 0 10*3/uL (ref 0.0–0.5)
Eosinophils Relative: 1 %
HCT: 38.7 % (ref 36.0–46.0)
Hemoglobin: 12.8 g/dL (ref 12.0–15.0)
Immature Granulocytes: 0 %
Lymphocytes Relative: 19 %
Lymphs Abs: 0.9 10*3/uL (ref 0.7–4.0)
MCH: 30.1 pg (ref 26.0–34.0)
MCHC: 33.1 g/dL (ref 30.0–36.0)
MCV: 91.1 fL (ref 80.0–100.0)
Monocytes Absolute: 0.5 10*3/uL (ref 0.1–1.0)
Monocytes Relative: 10 %
Neutro Abs: 3.3 10*3/uL (ref 1.7–7.7)
Neutrophils Relative %: 69 %
Platelet Count: 185 10*3/uL (ref 150–400)
RBC: 4.25 MIL/uL (ref 3.87–5.11)
RDW: 12.6 % (ref 11.5–15.5)
WBC Count: 4.8 10*3/uL (ref 4.0–10.5)
nRBC: 0 % (ref 0.0–0.2)

## 2020-04-26 LAB — COMPREHENSIVE METABOLIC PANEL
ALT: 6 U/L (ref 0–44)
AST: 16 U/L (ref 15–41)
Albumin: 3.9 g/dL (ref 3.5–5.0)
Alkaline Phosphatase: 77 U/L (ref 38–126)
Anion gap: 9 (ref 5–15)
BUN: 11 mg/dL (ref 8–23)
CO2: 23 mmol/L (ref 22–32)
Calcium: 9.8 mg/dL (ref 8.9–10.3)
Chloride: 108 mmol/L (ref 98–111)
Creatinine, Ser: 0.82 mg/dL (ref 0.44–1.00)
GFR calc Af Amer: >60 mL/min (ref >60)
GFR calc non Af Amer: >60 mL/min (ref >60)
Glucose, Bld: 88 mg/dL (ref 70–99)
Potassium: 4 mmol/L (ref 3.5–5.1)
Sodium: 140 mmol/L (ref 135–145)
Total Bilirubin: 0.4 mg/dL (ref 0.3–1.2)
Total Protein: 7.1 g/dL (ref 6.5–8.1)

## 2020-04-26 MED ORDER — PALONOSETRON HCL INJECTION 0.25 MG/5ML
0.2500 mg | Freq: Once | INTRAVENOUS | Status: AC
  Administered 2020-04-26: 0.25 mg via INTRAVENOUS

## 2020-04-26 MED ORDER — SODIUM CHLORIDE 0.9 % IV SOLN
150.0000 mg | Freq: Once | INTRAVENOUS | Status: AC
  Administered 2020-04-26: 150 mg via INTRAVENOUS
  Filled 2020-04-26: qty 150

## 2020-04-26 MED ORDER — SODIUM CHLORIDE 0.9 % IV SOLN
10.0000 mg | Freq: Once | INTRAVENOUS | Status: AC
  Administered 2020-04-26: 10 mg via INTRAVENOUS
  Filled 2020-04-26: qty 10

## 2020-04-26 MED ORDER — PALONOSETRON HCL INJECTION 0.25 MG/5ML
INTRAVENOUS | Status: AC
  Filled 2020-04-26: qty 5

## 2020-04-26 MED ORDER — SODIUM CHLORIDE 0.9 % IV SOLN
Freq: Once | INTRAVENOUS | Status: AC
  Filled 2020-04-26: qty 250

## 2020-04-26 MED ORDER — SODIUM CHLORIDE 0.9 % IV SOLN
300.0000 mg | Freq: Once | INTRAVENOUS | Status: AC
  Administered 2020-04-26: 300 mg via INTRAVENOUS
  Filled 2020-04-26: qty 30

## 2020-04-26 NOTE — Patient Instructions (Signed)
[facility] °Discharge Instructions for Patients Receiving Chemotherapy ° °Today you received the following chemotherapy agents Carboplatin (PARAPLATIN). ° °To help prevent nausea and vomiting after your treatment, we encourage you to take your nausea medication as prescribed. °  °If you develop nausea and vomiting that is not controlled by your nausea medication, call the clinic.  ° °BELOW ARE SYMPTOMS THAT SHOULD BE REPORTED IMMEDIATELY: °· *FEVER GREATER THAN 100.5 F °· *CHILLS WITH OR WITHOUT FEVER °· NAUSEA AND VOMITING THAT IS NOT CONTROLLED WITH YOUR NAUSEA MEDICATION °· *UNUSUAL SHORTNESS OF BREATH °· *UNUSUAL BRUISING OR BLEEDING °· TENDERNESS IN MOUTH AND THROAT WITH OR WITHOUT PRESENCE OF ULCERS °· *URINARY PROBLEMS °· *BOWEL PROBLEMS °· UNUSUAL RASH °Items with * indicate a potential emergency and should be followed up as soon as possible. ° °Feel free to call the clinic should you have any questions or concerns. The clinic phone number is [phone]. ° °Please show the CHEMO ALERT CARD at check-in to the Emergency Department and triage nurse. ° °Carboplatin injection °What is this medicine? °CARBOPLATIN (KAR boe pla tin) is a chemotherapy drug. It targets fast dividing cells, like cancer cells, and causes these cells to die. This medicine is used to treat ovarian cancer and many other cancers. °This medicine may be used for other purposes; ask your health care provider or pharmacist if you have questions. °COMMON BRAND NAME(S): Paraplatin °What should I tell my health care provider before I take this medicine? °They need to know if you have any of these conditions: °· blood disorders °· hearing problems °· kidney disease °· recent or ongoing radiation therapy °· an unusual or allergic reaction to carboplatin, cisplatin, other chemotherapy, other medicines, foods, dyes, or preservatives °· pregnant or trying to get pregnant °· breast-feeding °How should I use this medicine? °This drug  is usually given as an infusion into a vein. It is administered in a hospital or clinic by a specially trained health care professional. °Talk to your pediatrician regarding the use of this medicine in children. Special care may be needed. °Overdosage: If you think you have taken too much of this medicine contact a poison control center or emergency room at once. °NOTE: This medicine is only for you. Do not share this medicine with others. °What if I miss a dose? °It is important not to miss a dose. Call your doctor or health care professional if you are unable to keep an appointment. °What may interact with this medicine? °· medicines for seizures °· medicines to increase blood counts like filgrastim, pegfilgrastim, sargramostim °· some antibiotics like amikacin, gentamicin, neomycin, streptomycin, tobramycin °· vaccines °Talk to your doctor or health care professional before taking any of these medicines: °· acetaminophen °· aspirin °· ibuprofen °· ketoprofen °· naproxen °This list may not describe all possible interactions. Give your health care provider a list of all the medicines, herbs, non-prescription drugs, or dietary supplements you use. Also tell them if you smoke, drink alcohol, or use illegal drugs. Some items may interact with your medicine. °What should I watch for while using this medicine? °Your condition will be monitored carefully while you are receiving this medicine. You will need important blood work done while you are taking this medicine. °This drug may make you feel generally unwell. This is not uncommon, as chemotherapy can affect healthy cells as well as cancer cells. Report any side effects. Continue your course of treatment even though you feel ill unless your doctor tells you to   stop. °In some cases, you may be given additional medicines to help with side effects. Follow all directions for their use. °Call your doctor or health care professional for advice if you get a fever, chills or  sore throat, or other symptoms of a cold or flu. Do not treat yourself. This drug decreases your body's ability to fight infections. Try to avoid being around people who are sick. °This medicine may increase your risk to bruise or bleed. Call your doctor or health care professional if you notice any unusual bleeding. °Be careful brushing and flossing your teeth or using a toothpick because you may get an infection or bleed more easily. If you have any dental work done, tell your dentist you are receiving this medicine. °Avoid taking products that contain aspirin, acetaminophen, ibuprofen, naproxen, or ketoprofen unless instructed by your doctor. These medicines may hide a fever. °Do not become pregnant while taking this medicine. Women should inform their doctor if they wish to become pregnant or think they might be pregnant. There is a potential for serious side effects to an unborn child. Talk to your health care professional or pharmacist for more information. Do not breast-feed an infant while taking this medicine. °What side effects may I notice from receiving this medicine? °Side effects that you should report to your doctor or health care professional as soon as possible: °· allergic reactions like skin rash, itching or hives, swelling of the face, lips, or tongue °· signs of infection - fever or chills, cough, sore throat, pain or difficulty passing urine °· signs of decreased platelets or bleeding - bruising, pinpoint red spots on the skin, black, tarry stools, nosebleeds °· signs of decreased red blood cells - unusually weak or tired, fainting spells, lightheadedness °· breathing problems °· changes in hearing °· changes in vision °· chest pain °· high blood pressure °· low blood counts - This drug may decrease the number of white blood cells, red blood cells and platelets. You may be at increased risk for infections and bleeding. °· nausea and vomiting °· pain, swelling, redness or irritation at the  injection site °· pain, tingling, numbness in the hands or feet °· problems with balance, talking, walking °· trouble passing urine or change in the amount of urine °Side effects that usually do not require medical attention (report to your doctor or health care professional if they continue or are bothersome): °· hair loss °· loss of appetite °· metallic taste in the mouth or changes in taste °This list may not describe all possible side effects. Call your doctor for medical advice about side effects. You may report side effects to FDA at 1-800-FDA-1088. °Where should I keep my medicine? °This drug is given in a hospital or clinic and will not be stored at home. °NOTE: This sheet is a summary. It may not cover all possible information. If you have questions about this medicine, talk to your doctor, pharmacist, or health care provider. °© 2020 [employer]vier/Gold Standard (2007-12-06 14:38:05) ° °

## 2020-04-26 NOTE — Progress Notes (Signed)
Decreasing carboplatin dose to 300 mg for AUC 5 per Dr. Calton Dach instructions.

## 2020-04-29 ENCOUNTER — Telehealth: Payer: Self-pay | Admitting: Emergency Medicine

## 2020-04-29 NOTE — Telephone Encounter (Signed)
-----   Message from Georgianne Fick, RN sent at 04/26/2020  1:11 PM EDT ----- Regarding: Dr. Calton Dach First Time Chemo Patient Patient received first time Carboplatin today and tolerated this well.

## 2020-04-29 NOTE — Telephone Encounter (Signed)
Chemo f/u call, spoke with patient.  Pt denies any questions/concerns at this time but is aware to f/u as needed with CC.

## 2020-04-30 ENCOUNTER — Encounter (HOSPITAL_COMMUNITY): Payer: Self-pay

## 2020-05-17 ENCOUNTER — Inpatient Hospital Stay (HOSPITAL_BASED_OUTPATIENT_CLINIC_OR_DEPARTMENT_OTHER): Payer: Medicare Other | Admitting: Hematology and Oncology

## 2020-05-17 ENCOUNTER — Telehealth: Payer: Self-pay | Admitting: Hematology and Oncology

## 2020-05-17 ENCOUNTER — Encounter: Payer: Self-pay | Admitting: Hematology and Oncology

## 2020-05-17 ENCOUNTER — Inpatient Hospital Stay: Payer: Medicare Other

## 2020-05-17 ENCOUNTER — Other Ambulatory Visit: Payer: Self-pay | Admitting: Hematology and Oncology

## 2020-05-17 ENCOUNTER — Other Ambulatory Visit: Payer: Self-pay

## 2020-05-17 ENCOUNTER — Inpatient Hospital Stay: Payer: Medicare Other | Attending: Gynecologic Oncology

## 2020-05-17 DIAGNOSIS — R64 Cachexia: Secondary | ICD-10-CM

## 2020-05-17 DIAGNOSIS — Z9221 Personal history of antineoplastic chemotherapy: Secondary | ICD-10-CM | POA: Diagnosis not present

## 2020-05-17 DIAGNOSIS — C539 Malignant neoplasm of cervix uteri, unspecified: Secondary | ICD-10-CM | POA: Diagnosis present

## 2020-05-17 DIAGNOSIS — Z79899 Other long term (current) drug therapy: Secondary | ICD-10-CM | POA: Diagnosis not present

## 2020-05-17 DIAGNOSIS — Z23 Encounter for immunization: Secondary | ICD-10-CM | POA: Diagnosis not present

## 2020-05-17 DIAGNOSIS — Z7189 Other specified counseling: Secondary | ICD-10-CM

## 2020-05-17 DIAGNOSIS — D61818 Other pancytopenia: Secondary | ICD-10-CM | POA: Diagnosis not present

## 2020-05-17 DIAGNOSIS — Z923 Personal history of irradiation: Secondary | ICD-10-CM | POA: Diagnosis not present

## 2020-05-17 DIAGNOSIS — Z299 Encounter for prophylactic measures, unspecified: Secondary | ICD-10-CM | POA: Diagnosis not present

## 2020-05-17 DIAGNOSIS — Z5111 Encounter for antineoplastic chemotherapy: Secondary | ICD-10-CM | POA: Diagnosis present

## 2020-05-17 LAB — COMPREHENSIVE METABOLIC PANEL
ALT: <6 U/L (ref 0–44)
AST: 12 U/L — ABNORMAL LOW (ref 15–41)
Albumin: 3.7 g/dL (ref 3.5–5.0)
Alkaline Phosphatase: 78 U/L (ref 38–126)
Anion gap: 9 (ref 5–15)
BUN: 14 mg/dL (ref 8–23)
CO2: 25 mmol/L (ref 22–32)
Calcium: 8.9 mg/dL (ref 8.9–10.3)
Chloride: 107 mmol/L (ref 98–111)
Creatinine, Ser: 0.78 mg/dL (ref 0.44–1.00)
GFR calc Af Amer: >60 mL/min (ref >60)
GFR calc non Af Amer: >60 mL/min (ref >60)
Glucose, Bld: 90 mg/dL (ref 70–99)
Potassium: 4 mmol/L (ref 3.5–5.1)
Sodium: 141 mmol/L (ref 135–145)
Total Bilirubin: 0.3 mg/dL (ref 0.3–1.2)
Total Protein: 6.7 g/dL (ref 6.5–8.1)

## 2020-05-17 LAB — CBC WITH DIFFERENTIAL (CANCER CENTER ONLY)
Abs Immature Granulocytes: 0.01 10*3/uL (ref 0.00–0.07)
Basophils Absolute: 0 10*3/uL (ref 0.0–0.1)
Basophils Relative: 1 %
Eosinophils Absolute: 0 10*3/uL (ref 0.0–0.5)
Eosinophils Relative: 1 %
HCT: 36.6 % (ref 36.0–46.0)
Hemoglobin: 12.2 g/dL (ref 12.0–15.0)
Immature Granulocytes: 0 %
Lymphocytes Relative: 19 %
Lymphs Abs: 0.7 10*3/uL (ref 0.7–4.0)
MCH: 30 pg (ref 26.0–34.0)
MCHC: 33.3 g/dL (ref 30.0–36.0)
MCV: 90.1 fL (ref 80.0–100.0)
Monocytes Absolute: 0.4 10*3/uL (ref 0.1–1.0)
Monocytes Relative: 12 %
Neutro Abs: 2.5 10*3/uL (ref 1.7–7.7)
Neutrophils Relative %: 67 %
Platelet Count: 97 10*3/uL — ABNORMAL LOW (ref 150–400)
RBC: 4.06 MIL/uL (ref 3.87–5.11)
RDW: 12.9 % (ref 11.5–15.5)
WBC Count: 3.7 10*3/uL — ABNORMAL LOW (ref 4.0–10.5)
nRBC: 0 % (ref 0.0–0.2)

## 2020-05-17 MED ORDER — PALONOSETRON HCL INJECTION 0.25 MG/5ML
INTRAVENOUS | Status: AC
  Filled 2020-05-17: qty 5

## 2020-05-17 MED ORDER — SODIUM CHLORIDE 0.9 % IV SOLN
10.0000 mg | Freq: Once | INTRAVENOUS | Status: AC
  Administered 2020-05-17: 10 mg via INTRAVENOUS
  Filled 2020-05-17: qty 10

## 2020-05-17 MED ORDER — SODIUM CHLORIDE 0.9 % IV SOLN
Freq: Once | INTRAVENOUS | Status: AC
  Filled 2020-05-17: qty 250

## 2020-05-17 MED ORDER — SODIUM CHLORIDE 0.9 % IV SOLN
150.0000 mg | Freq: Once | INTRAVENOUS | Status: AC
  Administered 2020-05-17: 150 mg via INTRAVENOUS
  Filled 2020-05-17: qty 150

## 2020-05-17 MED ORDER — SODIUM CHLORIDE 0.9 % IV SOLN
268.2000 mg | Freq: Once | INTRAVENOUS | Status: AC
  Administered 2020-05-17: 270 mg via INTRAVENOUS
  Filled 2020-05-17: qty 27

## 2020-05-17 MED ORDER — PALONOSETRON HCL INJECTION 0.25 MG/5ML
0.2500 mg | Freq: Once | INTRAVENOUS | Status: AC
  Administered 2020-05-17: 0.25 mg via INTRAVENOUS

## 2020-05-17 NOTE — Telephone Encounter (Signed)
Scheduled appts per 9/3 sch msg. Gave pt a print out of AVS.  °

## 2020-05-17 NOTE — Assessment & Plan Note (Signed)
She tolerated chemotherapy well except for pancytopenia I plan to adjust the dose of chemotherapy a little bit Recommend minimum 3-4 cycles of treatment before repeat imaging study She is in agreement

## 2020-05-17 NOTE — Progress Notes (Signed)
[place] OFFICE PROGRESS NOTE  Patient Care Team: Patient, [name] as PCP - General (General Practice)  ASSESSMENT & PLAN:  Malignant neoplasm of cervix ([place]) She tolerated chemotherapy well except for pancytopenia I plan to adjust the dose of chemotherapy a little bit Recommend minimum 3-4 cycles of treatment before repeat imaging study She is in agreement  Acquired pancytopenia ([place]) She has mild pancytopenia due to treatment We will proceed with dose adjustment She is not symptomatic Observe closely for now  [facility]) Despite multiple counseling and significant work-up in the past, the patient is not able to gain weight We discussed the importance of adequate intake I recommend frequent small meals  Preventive measure We discussed the importance of preventive care and reviewed the vaccination programs. She does not have any prior allergic reactions to Covid-19 vaccination. She agrees to proceed with booster dose of Covid-19 vaccination today and we will administer it today at the clinic.    No orders of the defined types were placed in this encounter.   All questions were answered. The patient knows to call the clinic with any problems, questions or concerns. The total time spent in the appointment was 30 minutes encounter with patients including review of chart and various tests results, discussions about plan of care and coordination of care plan   [name], MD 05/17/2020 3:26 PM  INTERVAL HISTORY: Please see below for problem oriented charting. She is seen prior to cycle 2 of treatment She tolerated treatment well No recent nausea or vomiting Denies recent vaginal bleeding No recent infection, fever or chills She has not lost any weight  SUMMARY OF ONCOLOGIC HISTORY: Oncology History Overview Note  Cancer Staging Malignant neoplasm of cervix ([place]) Staging form: Cervix Uteri, AJCC 8th Edition - Clinical: Stage IIIB (cT3b, cN1, cM0) -  Signed by [name], MD on 05/04/2018  PD-L1 1%   Malignant neoplasm of cervix ([place])  03/13/2018 Imaging   US pelvis There are mobile echogenic foci within the endometrial cavity suggestive of gas. This is nonspecific in etiology however may be secondary to endometritis. Correlate for history of recent instrumentation. This obscures visualization of the endometrial tissue and therefore a follow-up pelvic ultrasound in 2-4 weeks is recommended after resolution of the acute symptomatology if symptoms persist to further evaluate the endometrium.    03/13/2018 Initial Diagnosis   She initially presented with PMB   04/12/2018 Pathology Results   Cervix, biopsy, mass - INVASIVE SQUAMOUS CELL CARCINOMA - SEE COMMENT   04/12/2018 Surgery   PREOPERATIVE DIAGNOSIS:  Postmenopausal vaginal bleeding. POSTOPERATIVE DIAGNOSIS: The same PROCEDURE: Exam under anesthesia, pap smear, cervical mass biopsy SURGEON:  Dr. [name]  INDICATIONS: 69 y.o. yo [id number] with PMB here for exam under anesthesia.Risks of surgery were discussed with the patient including but not limited to: bleeding which may require transfusion; infection which may require antibiotics; injury to uterus or surrounding organs; need for additional procedures including laparotomy or laparoscopy; and other postoperative/anesthesia complications. Written informed consent was obtained.    FINDINGS:  An 8-week size midline uterus.  No adnexal mass palpable on exam. Normal cervix not visualized. Friable mass seen in involving the vagina at the level of the cervix obliterating the anterior and posterior fornix.  ANESTHESIA:   General INTRAVENOUS FLUIDS:  300 ml of LR ESTIMATED BLOOD LOSS: 20 ml. SPECIMENS: pap smear, cervical mass biopsy COMPLICATIONS:  None immediate.    04/25/2018 PET scan   1. Large cervical mass may invade  into the myometrium in down into the vagina, maximum SUV 21.3. There is a malignant left external iliac  node measuring 1.4 cm in short axis with maximum SUV 14.1. 2. Accentuated activity in the cecum and ascending colon is likely physiologic given that it has no CT correlate. Correlation with the patient's colon cancer screening history is recommended. If screening is not up-to-date, appropriate screening should be considered. 3.  Aortic Atherosclerosis (ICD10-I70.0).    04/28/2018 Surgery   Pre-operative Diagnosis:  At least Stage 2B SCCa Cervical Cancer  Post-operative Diagnosis: At least clinical Stage 3A SCCa Cervical Cancer  Operation:  Exam under anesthesia due to intolerance for vaginal exam in office Cystoscopy due to concern for extension to posterior bladder (from anterior vaginal wall lesion)  Operative Findings:   1. Parametrial involvement on right  2. Right sided subvaginal tumor burden ~3cm. This approximates the right ureteral insertion into the bladder based on my exam during cystoscopy. 3. Extension of disease down upper half of vagina on lateral and posterior walls. 4. Extension of disease down to lower third of anterior vagina (versus skip lesion) thus making her Stage 3A 5. Cystoscopy revealed patent bilateral ureteral orifices and no bladder mucosa invasion or areas of concern. 6. Rectal exam grossly no disease.    05/04/2018 Cancer Staging   Staging form: Cervix Uteri, AJCC 8th Edition - Clinical: Stage IIIB (cT3b, cN1, cM0) - Signed by [name], MD on 05/04/2018   05/12/2018 Procedure   Successful 8 French right internal jugular vein power port placement with its tip at the SVC/RA junction   05/13/2018 - 06/24/2018 Chemotherapy   The patient had weekly cisplatin given with concurrent radiation    05/17/2018 - 07/27/2018 Radiation Therapy   Radiation treatment dates:05/17/2018-07/27/2018  Site/dose:1. Cervix, 1.8 Gy in 25 fractions for a total dose of 45 Gy 2. Pelvis  Boost, 1.8 Gy in 5 fractions for a total dose of 9  Gy 3. Cervix,5.5Gy in 5 fractions for a total dose of 27.5Gy   06/03/2018 Adverse Reaction   She missed her chemo due to severe depression and was sent to the ER for suicidal ideation.  She was seen by psychiatrist.   10/27/2018 PET scan   IMPRESSION: 1. Complete metabolic response to therapy, without residual or recurrent hypermetabolic disease. 2.  Aortic Atherosclerosis (ICD10-I70.0).    11/17/2018 Imaging   Successful right IJ vein Port-A-Cath explant.   03/29/2020 PET scan   1. Hypermetabolic lesion along the right vaginal cuff is highly worrisome for recurrent cervical cancer. No evidence of distant metastatic disease. 2.  Aortic atherosclerosis (ICD10-I70.0).   04/26/2020 -  Chemotherapy   The patient had palonosetron (ALOXI) injection 0.25 mg, 0.25 mg, Intravenous,  Once, 2 of 4 cycles Administration: 0.25 mg (04/26/2020), 0.25 mg (05/17/2020) CARBOplatin (PARAPLATIN) 300 mg in sodium chloride 0.9 % 100 mL chemo infusion, 330 mg (100 % of original dose 333.183 mg), Intravenous,  Once, 2 of 4 cycles Dose modification:   (original dose 333.183 mg, Cycle 1), 333.183 mg (original dose 333.183 mg, Cycle 1), 299.8647 mg (90 % of original dose 333.183 mg, Cycle 2, Reason: Dose Not Tolerated),   (original dose 333.183 mg, Cycle 2, Reason: Dose not tolerated) Administration: 300 mg (04/26/2020), 270 mg (05/17/2020) fosaprepitant (EMEND) 150 mg in sodium chloride 0.9 % 145 mL IVPB, 150 mg, Intravenous,  Once, 2 of 4 cycles Administration: 150 mg (04/26/2020), 150 mg (05/17/2020)  for chemotherapy treatment.     Genetic Testing   Patient has  genetic testing done for PD-L1. Results revealed patient has the following:  PD-L1 combined positive score (CPS): 1%     REVIEW OF SYSTEMS:   Constitutional: Denies fevers, chills or abnormal weight loss Eyes: Denies blurriness of vision Ears, nose, mouth, throat, and face: Denies mucositis or sore throat Respiratory: Denies cough,  dyspnea or wheezes Cardiovascular: Denies palpitation, chest discomfort or lower extremity swelling Gastrointestinal:  Denies nausea, heartburn or change in bowel habits Skin: Denies abnormal skin rashes Lymphatics: Denies new lymphadenopathy or easy bruising Neurological:Denies numbness, tingling or new weaknesses Behavioral/Psych: Mood is stable, no new changes  All other systems were reviewed with the patient and are negative.  I have reviewed the past medical history, past surgical history, social history and family history with the patient and they are unchanged from previous note.  ALLERGIES:  has No Known Allergies.  MEDICATIONS:  Current Outpatient Medications  Medication Sig Dispense Refill  . Multiple Vitamin (MULTIVITAMIN) tablet Take 1 tablet by mouth daily.    . ondansetron (ZOFRAN) 8 MG tablet Take 1 tablet (8 mg total) by mouth every 8 (eight) hours as needed for refractory nausea / vomiting. Start on day 3 after carboplatin chemo. 30 tablet 1  . prochlorperazine (COMPAZINE) 10 MG tablet Take 1 tablet (10 mg total) by mouth every 6 (six) hours as needed (Nausea or vomiting). 30 tablet 1   No current facility-administered medications for this visit.    PHYSICAL EXAMINATION: ECOG PERFORMANCE STATUS: 1 - Symptomatic but completely ambulatory  Vitals:   05/17/20 1031  BP: (!) 125/56  Pulse: 82  Resp: 18  Temp: 98.1 F (36.7 C)  SpO2: 99%   Filed Weights   05/17/20 1031  Weight: 87 lb 6.4 oz (39.6 kg)    GENERAL:alert, no distress and comfortable.  She looks thin and cachectic SKIN: skin color, texture, turgor are normal, no rashes or significant lesions EYES: normal, Conjunctiva are pink and non-injected, sclera clear OROPHARYNX:no exudate, no erythema and lips, buccal mucosa, and tongue normal  NECK: supple, thyroid normal size, non-tender, without nodularity LYMPH:  no palpable lymphadenopathy in the cervical, axillary or inguinal LUNGS: clear to  auscultation and percussion with normal breathing effort HEART: regular rate & rhythm and no murmurs and no lower extremity edema ABDOMEN:abdomen soft, non-tender and normal bowel sounds Musculoskeletal:no cyanosis of digits and no clubbing  NEURO: alert & oriented x 3 with fluent speech, no focal motor/sensory deficits  LABORATORY DATA:  I have reviewed the data as listed    Component Value Date/Time   NA 141 05/17/2020 0951   K 4.0 05/17/2020 0951   CL 107 05/17/2020 0951   CO2 25 05/17/2020 0951   GLUCOSE 90 05/17/2020 0951   BUN 14 05/17/2020 0951   CREATININE 0.78 05/17/2020 0951   CREATININE 0.83 03/11/2020 0951   CALCIUM 8.9 05/17/2020 0951   PROT 6.7 05/17/2020 0951   ALBUMIN 3.7 05/17/2020 0951   AST 12 (L) 05/17/2020 0951   AST 14 (L) 12/11/2019 1141   ALT <6 05/17/2020 0951   ALT 7 12/11/2019 1141   ALKPHOS 78 05/17/2020 0951   BILITOT 0.3 05/17/2020 0951   BILITOT 0.4 12/11/2019 1141   GFRNONAA >60 05/17/2020 0951   GFRNONAA >60 03/11/2020 0951   GFRAA >60 05/17/2020 0951   GFRAA >60 03/11/2020 0951    No results found for: SPEP, UPEP  Lab Results  Component Value Date   WBC 3.7 (L) 05/17/2020   NEUTROABS 2.5 05/17/2020   HGB 12.2 05/17/2020  HCT 36.6 05/17/2020   MCV 90.1 05/17/2020   PLT 97 (L) 05/17/2020      Chemistry      Component Value Date/Time   NA 141 05/17/2020 0951   K 4.0 05/17/2020 0951   CL 107 05/17/2020 0951   CO2 25 05/17/2020 0951   BUN 14 05/17/2020 0951   CREATININE 0.78 05/17/2020 0951   CREATININE 0.83 03/11/2020 0951      Component Value Date/Time   CALCIUM 8.9 05/17/2020 0951   ALKPHOS 78 05/17/2020 0951   AST 12 (L) 05/17/2020 0951   AST 14 (L) 12/11/2019 1141   ALT <6 05/17/2020 0951   ALT 7 12/11/2019 1141   BILITOT 0.3 05/17/2020 0951   BILITOT 0.4 12/11/2019 1141

## 2020-05-17 NOTE — Assessment & Plan Note (Signed)
She has mild pancytopenia due to treatment We will proceed with dose adjustment She is not symptomatic Observe closely for now

## 2020-05-17 NOTE — Assessment & Plan Note (Signed)
We discussed the importance of preventive care and reviewed the vaccination programs. She does not have any prior allergic reactions to Covid-19 vaccination. She agrees to proceed with booster dose of Covid-19 vaccination today and we will administer it today at the clinic.  

## 2020-05-17 NOTE — Assessment & Plan Note (Signed)
Despite multiple counseling and significant work-up in the past, the patient is not able to gain weight We discussed the importance of adequate intake I recommend frequent small meals

## 2020-05-17 NOTE — Patient Instructions (Signed)
[facility] Discharge Instructions for Patients Receiving Chemotherapy  Today you received the following chemotherapy agents: Carboplatin.  To help prevent nausea and vomiting after your treatment, we encourage you to take your nausea medication as prescribed.   If you develop nausea and vomiting that is not controlled by your nausea medication, call the clinic.   BELOW ARE SYMPTOMS THAT SHOULD BE REPORTED IMMEDIATELY:  *FEVER GREATER THAN 100.5 F  *CHILLS WITH OR WITHOUT FEVER  NAUSEA AND VOMITING THAT IS NOT CONTROLLED WITH YOUR NAUSEA MEDICATION  *UNUSUAL SHORTNESS OF BREATH  *UNUSUAL BRUISING OR BLEEDING  TENDERNESS IN MOUTH AND THROAT WITH OR WITHOUT PRESENCE OF ULCERS  *URINARY PROBLEMS  *BOWEL PROBLEMS  UNUSUAL RASH Items with * indicate a potential emergency and should be followed up as soon as possible.  Feel free to call the clinic should you have any questions or concerns. The clinic phone number is [phone].  Please show the CHEMO ALERT CARD at check-in to the Emergency Department and triage nurse.     

## 2020-05-22 ENCOUNTER — Telehealth: Payer: Self-pay | Admitting: Oncology

## 2020-05-22 NOTE — Telephone Encounter (Signed)
Left a message that the follow up apt with Dr. Sondra Come on 06/13/20 has been canceled since she is currently getting chemotherapy.

## 2020-05-27 IMAGING — US IR FLUORO GUIDE CV LINE*L*
1 series · 1 of 1 positions shown · non-contrast
Comparison: none

CLINICAL DATA: Cervical cancer

[Series 1: ir fluoro/shunt/fist · 1 of 1 slices shown]
[im 1/1]
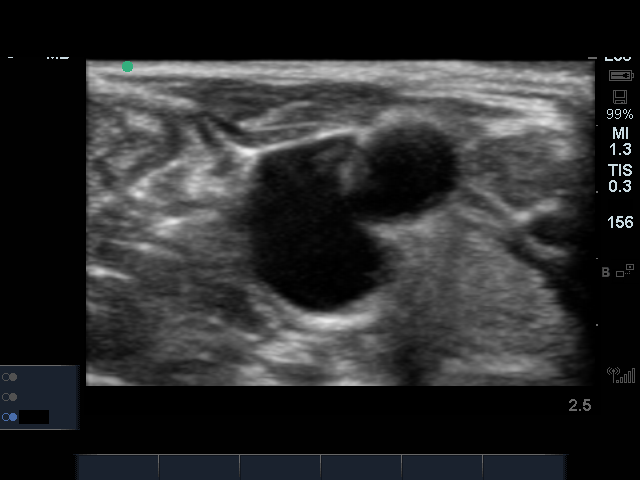

[1 of 1 positions shown; findings below may reference images not displayed]

EXAM:
TUNNEL POWER PORT PLACEMENT WITH SUBCUTANEOUS POCKET UTILIZING
ULTRASOUND & FLOUROSCOPY

FLUOROSCOPY TIME:  54 seconds.  Three mGy.

MEDICATIONS AND MEDICAL HISTORY:
Versed 2 mg, Fentanyl 100 mcg.

Additional Medications: Ancef 2 g. Antibiotics were given within 2
hours of the procedure.

ANESTHESIA/SEDATION:
Moderate sedation time: 34 minutes. Nursing monitored the the
patient during the procedure.

PROCEDURE:
After written informed consent was obtained, patient was placed in
the supine position on angiographic table. The right neck and chest
was prepped and draped in a sterile fashion. Lidocaine was utilized
for local anesthesia. The right jugular vein was noted to be patent
initially with ultrasound. Under sonographic guidance, a
micropuncture needle was inserted into the right IJ vein (Ultrasound
and fluoroscopic image documentation was performed). The needle was
removed over an 018 wire which was exchanged for a Amplatz. This was
advanced into the IVC. An 8-French dilator was advanced over the
Amplatz.

A small incision was made in the right upper chest over the anterior
right second rib. Utilizing blunt dissection, a subcutaneous pocket
was created in the caudal direction. The pocket was irrigated with a
copious amount of sterile normal saline. The port catheter was
tunneled from the chest incision, and out the neck incision. The
reservoir was inserted into the subcutaneous pocket and secured with
two 3-0 Ethilon stitches. A peel-away sheath was advanced over the
Amplatz wire. The port catheter was cut to measure length and
inserted through the peel-away sheath. The peel-away sheath was
removed. The chest incision was closed with 3-0 Vicryl interrupted
stitches for the subcutaneous tissue and a running of 4-0 Vicryl
subcuticular stitch for the skin. The neck incision was closed with
a 4-0 Vicryl subcuticular stitch. Derma-bond was applied to both
surgical incisions. The port reservoir was flushed and instilled
with heparinized saline. No complications.
FINDINGS: A right IJ vein Port-A-Cath is in place with its tip at the
cavoatrial junction.

COMPLICATIONS:
None
IMPRESSION: Successful 8 French right internal jugular vein power port placement
with its tip at the SVC/RA junction.

## 2020-06-13 ENCOUNTER — Ambulatory Visit: Payer: Self-pay | Admitting: Radiation Oncology

## 2020-06-14 ENCOUNTER — Telehealth: Payer: Self-pay | Admitting: Hematology and Oncology

## 2020-06-14 ENCOUNTER — Ambulatory Visit: Payer: Medicare Other

## 2020-06-14 ENCOUNTER — Other Ambulatory Visit: Payer: Self-pay

## 2020-06-14 ENCOUNTER — Inpatient Hospital Stay: Payer: Medicare Other | Attending: Gynecologic Oncology

## 2020-06-14 ENCOUNTER — Encounter: Payer: Self-pay | Admitting: Hematology and Oncology

## 2020-06-14 ENCOUNTER — Inpatient Hospital Stay (HOSPITAL_BASED_OUTPATIENT_CLINIC_OR_DEPARTMENT_OTHER): Payer: Medicare Other | Admitting: Hematology and Oncology

## 2020-06-14 ENCOUNTER — Inpatient Hospital Stay: Payer: Medicare Other

## 2020-06-14 ENCOUNTER — Ambulatory Visit: Payer: Medicare Other | Admitting: Hematology and Oncology

## 2020-06-14 ENCOUNTER — Other Ambulatory Visit: Payer: Medicare Other

## 2020-06-14 VITALS — BP 141/87 | HR 79 | Temp 97.3°F | Resp 18 | Ht 62.0 in | Wt 88.2 lb

## 2020-06-14 DIAGNOSIS — Z79899 Other long term (current) drug therapy: Secondary | ICD-10-CM | POA: Diagnosis not present

## 2020-06-14 DIAGNOSIS — C539 Malignant neoplasm of cervix uteri, unspecified: Secondary | ICD-10-CM | POA: Diagnosis present

## 2020-06-14 DIAGNOSIS — Z7189 Other specified counseling: Secondary | ICD-10-CM

## 2020-06-14 DIAGNOSIS — R64 Cachexia: Secondary | ICD-10-CM

## 2020-06-14 DIAGNOSIS — D61818 Other pancytopenia: Secondary | ICD-10-CM | POA: Insufficient documentation

## 2020-06-14 DIAGNOSIS — Z923 Personal history of irradiation: Secondary | ICD-10-CM | POA: Insufficient documentation

## 2020-06-14 DIAGNOSIS — Z9221 Personal history of antineoplastic chemotherapy: Secondary | ICD-10-CM | POA: Insufficient documentation

## 2020-06-14 DIAGNOSIS — Z5111 Encounter for antineoplastic chemotherapy: Secondary | ICD-10-CM | POA: Insufficient documentation

## 2020-06-14 DIAGNOSIS — I700 Atherosclerosis of aorta: Secondary | ICD-10-CM | POA: Diagnosis not present

## 2020-06-14 DIAGNOSIS — D649 Anemia, unspecified: Secondary | ICD-10-CM | POA: Insufficient documentation

## 2020-06-14 LAB — COMPREHENSIVE METABOLIC PANEL
ALT: 8 U/L (ref 0–44)
AST: 14 U/L — ABNORMAL LOW (ref 15–41)
Albumin: 3.6 g/dL (ref 3.5–5.0)
Alkaline Phosphatase: 73 U/L (ref 38–126)
Anion gap: 7 (ref 5–15)
BUN: 11 mg/dL (ref 8–23)
CO2: 26 mmol/L (ref 22–32)
Calcium: 8.9 mg/dL (ref 8.9–10.3)
Chloride: 106 mmol/L (ref 98–111)
Creatinine, Ser: 0.78 mg/dL (ref 0.44–1.00)
GFR calc Af Amer: >60 mL/min (ref >60)
GFR calc non Af Amer: >60 mL/min (ref >60)
Glucose, Bld: 104 mg/dL — ABNORMAL HIGH (ref 70–99)
Potassium: 3.6 mmol/L (ref 3.5–5.1)
Sodium: 139 mmol/L (ref 135–145)
Total Bilirubin: 0.3 mg/dL (ref 0.3–1.2)
Total Protein: 6.9 g/dL (ref 6.5–8.1)

## 2020-06-14 LAB — CBC WITH DIFFERENTIAL (CANCER CENTER ONLY)
Abs Immature Granulocytes: 0.02 10*3/uL (ref 0.00–0.07)
Basophils Absolute: 0 10*3/uL (ref 0.0–0.1)
Basophils Relative: 0 %
Eosinophils Absolute: 0 10*3/uL (ref 0.0–0.5)
Eosinophils Relative: 1 %
HCT: 34.2 % — ABNORMAL LOW (ref 36.0–46.0)
Hemoglobin: 11.4 g/dL — ABNORMAL LOW (ref 12.0–15.0)
Immature Granulocytes: 1 %
Lymphocytes Relative: 13 %
Lymphs Abs: 0.6 10*3/uL — ABNORMAL LOW (ref 0.7–4.0)
MCH: 30.8 pg (ref 26.0–34.0)
MCHC: 33.3 g/dL (ref 30.0–36.0)
MCV: 92.4 fL (ref 80.0–100.0)
Monocytes Absolute: 0.4 10*3/uL (ref 0.1–1.0)
Monocytes Relative: 8 %
Neutro Abs: 3.4 10*3/uL (ref 1.7–7.7)
Neutrophils Relative %: 77 %
Platelet Count: 151 10*3/uL (ref 150–400)
RBC: 3.7 MIL/uL — ABNORMAL LOW (ref 3.87–5.11)
RDW: 15.2 % (ref 11.5–15.5)
WBC Count: 4.4 10*3/uL (ref 4.0–10.5)
nRBC: 0 % (ref 0.0–0.2)

## 2020-06-14 MED ORDER — PALONOSETRON HCL INJECTION 0.25 MG/5ML
INTRAVENOUS | Status: AC
  Filled 2020-06-14: qty 5

## 2020-06-14 MED ORDER — SODIUM CHLORIDE 0.9 % IV SOLN
Freq: Once | INTRAVENOUS | Status: AC
  Filled 2020-06-14: qty 250

## 2020-06-14 MED ORDER — SODIUM CHLORIDE 0.9 % IV SOLN
265.9500 mg | Freq: Once | INTRAVENOUS | Status: AC
  Administered 2020-06-14: 270 mg via INTRAVENOUS
  Filled 2020-06-14: qty 27

## 2020-06-14 MED ORDER — SODIUM CHLORIDE 0.9 % IV SOLN
10.0000 mg | Freq: Once | INTRAVENOUS | Status: AC
  Administered 2020-06-14: 10 mg via INTRAVENOUS
  Filled 2020-06-14: qty 10

## 2020-06-14 MED ORDER — PALONOSETRON HCL INJECTION 0.25 MG/5ML
0.2500 mg | Freq: Once | INTRAVENOUS | Status: AC
  Administered 2020-06-14: 0.25 mg via INTRAVENOUS

## 2020-06-14 MED ORDER — SODIUM CHLORIDE 0.9 % IV SOLN
150.0000 mg | Freq: Once | INTRAVENOUS | Status: AC
  Administered 2020-06-14: 150 mg via INTRAVENOUS
  Filled 2020-06-14: qty 150

## 2020-06-14 NOTE — Progress Notes (Signed)
[place] OFFICE PROGRESS NOTE  Patient Care Team: Patient, [name] as PCP - General (General Practice)  ASSESSMENT & PLAN:  Malignant neoplasm of cervix ([place]) She tolerated chemotherapy well  Since recent dose adjustment, her blood counts are better She will proceed with treatment today as scheduled I plan to order CT imaging before her next visit for objective assessment of response to therapy  Acquired pancytopenia ([place]) Since recent dose adjustment, her thrombocytopenia improved She is only mildly anemic Observe for now  Cachexia [facility]) She has not lost weight since last time I saw her Observe closely for now She will try to maintain her weight   Orders Placed This Encounter  Procedures  . CT ABDOMEN PELVIS W CONTRAST    Standing Status:   Future    Standing Expiration Date:   06/14/2021    Order Specific Question:   If indicated for the ordered procedure, I authorize the administration of contrast media per Radiology protocol    Answer:   Yes    Order Specific Question:   Preferred imaging location?    Answer:   [facility]    Order Specific Question:   Radiology Contrast Protocol - do NOT remove file path    Answer:   [email or web address]    All questions were answered. The patient knows to call the clinic with any problems, questions or concerns. The total time spent in the appointment was 20 minutes encounter with patients including review of chart and various tests results, discussions about plan of care and coordination of care plan   [name], MD 06/14/2020 10:11 AM  INTERVAL HISTORY: Please see below for problem oriented charting. She is seen prior to cycle 3 of therapy She tolerated recent treatment well No recent nausea Appetite is fair No recent infection, fever or chills  SUMMARY OF ONCOLOGIC HISTORY: Oncology History Overview Note  Cancer Staging Malignant neoplasm of cervix  ([place]) Staging form: Cervix Uteri, AJCC 8th Edition - Clinical: Stage IIIB (cT3b, cN1, cM0) - Signed by [name], MD on 05/04/2018  PD-L1 1%   Malignant neoplasm of cervix ([place])  03/13/2018 Imaging   US pelvis There are mobile echogenic foci within the endometrial cavity suggestive of gas. This is nonspecific in etiology however may be secondary to endometritis. Correlate for history of recent instrumentation. This obscures visualization of the endometrial tissue and therefore a follow-up pelvic ultrasound in 2-4 weeks is recommended after resolution of the acute symptomatology if symptoms persist to further evaluate the endometrium.    03/13/2018 Initial Diagnosis   She initially presented with PMB   04/12/2018 Pathology Results   Cervix, biopsy, mass - INVASIVE SQUAMOUS CELL CARCINOMA - SEE COMMENT   04/12/2018 Surgery   PREOPERATIVE DIAGNOSIS:  Postmenopausal vaginal bleeding. POSTOPERATIVE DIAGNOSIS: The same PROCEDURE: Exam under anesthesia, pap smear, cervical mass biopsy SURGEON:  Dr. [name]  INDICATIONS: 69 y.o. yo G0P0000 with PMB here for exam under anesthesia.Risks of surgery were discussed with the patient including but not limited to: bleeding which may require transfusion; infection which may require antibiotics; injury to uterus or surrounding organs; need for additional procedures including laparotomy or laparoscopy; and other postoperative/anesthesia complications. Written informed consent was obtained.    FINDINGS:  An 8-week size midline uterus.  No adnexal mass palpable on exam. Normal cervix not visualized. Friable mass seen in involving the vagina at the level of the cervix obliterating the anterior and posterior fornix.  ANESTHESIA:   General INTRAVENOUS FLUIDS:  300 ml of LR ESTIMATED BLOOD LOSS: 20 ml. SPECIMENS: pap smear, cervical mass biopsy COMPLICATIONS:  None immediate.    04/25/2018 PET scan   1. Large cervical mass may invade into the  myometrium in down into the vagina, maximum SUV 21.3. There is a malignant left external iliac node measuring 1.4 cm in short axis with maximum SUV 14.1. 2. Accentuated activity in the cecum and ascending colon is likely physiologic given that it has no CT correlate. Correlation with the patient's colon cancer screening history is recommended. If screening is not up-to-date, appropriate screening should be considered. 3.  Aortic Atherosclerosis (ICD10-I70.0).    04/28/2018 Surgery   Pre-operative Diagnosis:  At least Stage 2B SCCa Cervical Cancer  Post-operative Diagnosis: At least clinical Stage 3A SCCa Cervical Cancer  Operation:  Exam under anesthesia due to intolerance for vaginal exam in office Cystoscopy due to concern for extension to posterior bladder (from anterior vaginal wall lesion)  Operative Findings:   1. Parametrial involvement on right  2. Right sided subvaginal tumor burden ~3cm. This approximates the right ureteral insertion into the bladder based on my exam during cystoscopy. 3. Extension of disease down upper half of vagina on lateral and posterior walls. 4. Extension of disease down to lower third of anterior vagina (versus skip lesion) thus making her Stage 3A 5. Cystoscopy revealed patent bilateral ureteral orifices and no bladder mucosa invasion or areas of concern. 6. Rectal exam grossly no disease.    05/04/2018 Cancer Staging   Staging form: Cervix Uteri, AJCC 8th Edition - Clinical: Stage IIIB (cT3b, cN1, cM0) - Signed by [name], MD on 05/04/2018   05/12/2018 Procedure   Successful 8 French right internal jugular vein power port placement with its tip at the SVC/RA junction   05/13/2018 - 06/24/2018 Chemotherapy   The patient had weekly cisplatin given with concurrent radiation    05/17/2018 - 07/27/2018 Radiation Therapy   Radiation treatment dates:05/17/2018-07/27/2018  Site/dose:1. Cervix, 1.8 Gy in 25 fractions for a total dose of 45  Gy 2. Pelvis  Boost, 1.8 Gy in 5 fractions for a total dose of 9 Gy 3. Cervix,5.5Gy in 5 fractions for a total dose of 27.5Gy   06/03/2018 Adverse Reaction   She missed her chemo due to severe depression and was sent to the ER for suicidal ideation.  She was seen by psychiatrist.   10/27/2018 PET scan   IMPRESSION: 1. Complete metabolic response to therapy, without residual or recurrent hypermetabolic disease. 2.  Aortic Atherosclerosis (ICD10-I70.0).    11/17/2018 Imaging   Successful right IJ vein Port-A-Cath explant.   03/29/2020 PET scan   1. Hypermetabolic lesion along the right vaginal cuff is highly worrisome for recurrent cervical cancer. No evidence of distant metastatic disease. 2.  Aortic atherosclerosis (ICD10-I70.0).   04/26/2020 -  Chemotherapy   The patient had palonosetron (ALOXI) injection 0.25 mg, 0.25 mg, Intravenous,  Once, 2 of 4 cycles Administration: 0.25 mg (04/26/2020), 0.25 mg (05/17/2020) CARBOplatin (PARAPLATIN) 300 mg in sodium chloride 0.9 % 100 mL chemo infusion, 330 mg (100 % of original dose 333.183 mg), Intravenous,  Once, 2 of 4 cycles Dose modification:   (original dose 333.183 mg, Cycle 1), 333.183 mg (original dose 333.183 mg, Cycle 1), 299.8647 mg (90 % of original dose 333.183 mg, Cycle 2, Reason: Dose Not Tolerated),   (original dose 333.183 mg, Cycle 2, Reason: Dose not tolerated) Administration: 300 mg (04/26/2020), 270 mg (05/17/2020) fosaprepitant (EMEND) 150 mg in sodium chloride 0.9 %  145 mL IVPB, 150 mg, Intravenous,  Once, 2 of 4 cycles Administration: 150 mg (04/26/2020), 150 mg (05/17/2020)  for chemotherapy treatment.     Genetic Testing   Patient has genetic testing done for PD-L1. Results revealed patient has the following:  PD-L1 combined positive score (CPS): 1%     REVIEW OF SYSTEMS:   Constitutional: Denies fevers, chills or abnormal weight loss Eyes: Denies blurriness of vision Ears, nose,  mouth, throat, and face: Denies mucositis or sore throat Respiratory: Denies cough, dyspnea or wheezes Cardiovascular: Denies palpitation, chest discomfort or lower extremity swelling Gastrointestinal:  Denies nausea, heartburn or change in bowel habits Skin: Denies abnormal skin rashes Lymphatics: Denies new lymphadenopathy or easy bruising Neurological:Denies numbness, tingling or new weaknesses Behavioral/Psych: Mood is stable, no new changes  All other systems were reviewed with the patient and are negative.  I have reviewed the past medical history, past surgical history, social history and family history with the patient and they are unchanged from previous note.  ALLERGIES:  has No Known Allergies.  MEDICATIONS:  Current Outpatient Medications  Medication Sig Dispense Refill  . Multiple Vitamin (MULTIVITAMIN) tablet Take 1 tablet by mouth daily.    . ondansetron (ZOFRAN) 8 MG tablet Take 1 tablet (8 mg total) by mouth every 8 (eight) hours as needed for refractory nausea / vomiting. Start on day 3 after carboplatin chemo. 30 tablet 1  . prochlorperazine (COMPAZINE) 10 MG tablet Take 1 tablet (10 mg total) by mouth every 6 (six) hours as needed (Nausea or vomiting). 30 tablet 1   No current facility-administered medications for this visit.    PHYSICAL EXAMINATION: ECOG PERFORMANCE STATUS: 1 - Symptomatic but completely ambulatory  Vitals:   06/14/20 0920  BP: (!) 141/87  Pulse: 79  Resp: 18  Temp: (!) 97.3 F (36.3 C)  SpO2: 100%   Filed Weights   06/14/20 0920  Weight: 88 lb 3.2 oz (40 kg)    GENERAL:alert, no distress and comfortable.  She looks thin and mildly cachectic SKIN: skin color, texture, turgor are normal, no rashes or significant lesions EYES: normal, Conjunctiva are pink and non-injected, sclera clear OROPHARYNX:no exudate, no erythema and lips, buccal mucosa, and tongue normal  NECK: supple, thyroid normal size, non-tender, without nodularity LYMPH:   no palpable lymphadenopathy in the cervical, axillary or inguinal LUNGS: clear to auscultation and percussion with normal breathing effort HEART: regular rate & rhythm and no murmurs and no lower extremity edema ABDOMEN:abdomen soft, non-tender and normal bowel sounds Musculoskeletal:no cyanosis of digits and no clubbing  NEURO: alert & oriented x 3 with fluent speech, no focal motor/sensory deficits  LABORATORY DATA:  I have reviewed the data as listed    Component Value Date/Time   NA 139 06/14/2020 0844   K 3.6 06/14/2020 0844   CL 106 06/14/2020 0844   CO2 26 06/14/2020 0844   GLUCOSE 104 (H) 06/14/2020 0844   BUN 11 06/14/2020 0844   CREATININE 0.78 06/14/2020 0844   CREATININE 0.83 03/11/2020 0951   CALCIUM 8.9 06/14/2020 0844   PROT 6.9 06/14/2020 0844   ALBUMIN 3.6 06/14/2020 0844   AST 14 (L) 06/14/2020 0844   AST 14 (L) 12/11/2019 1141   ALT 8 06/14/2020 0844   ALT 7 12/11/2019 1141   ALKPHOS 73 06/14/2020 0844   BILITOT 0.3 06/14/2020 0844   BILITOT 0.4 12/11/2019 1141   GFRNONAA >60 06/14/2020 0844   GFRNONAA >60 03/11/2020 0951   GFRAA >60 06/14/2020 7353  GFRAA >60 03/11/2020 0951    No results found for: SPEP, UPEP  Lab Results  Component Value Date   WBC 4.4 06/14/2020   NEUTROABS 3.4 06/14/2020   HGB 11.4 (L) 06/14/2020   HCT 34.2 (L) 06/14/2020   MCV 92.4 06/14/2020   PLT 151 06/14/2020      Chemistry      Component Value Date/Time   NA 139 06/14/2020 0844   K 3.6 06/14/2020 0844   CL 106 06/14/2020 0844   CO2 26 06/14/2020 0844   BUN 11 06/14/2020 0844   CREATININE 0.78 06/14/2020 0844   CREATININE 0.83 03/11/2020 0951      Component Value Date/Time   CALCIUM 8.9 06/14/2020 0844   ALKPHOS 73 06/14/2020 0844   AST 14 (L) 06/14/2020 0844   AST 14 (L) 12/11/2019 1141   ALT 8 06/14/2020 0844   ALT 7 12/11/2019 1141   BILITOT 0.3 06/14/2020 0844   BILITOT 0.4 12/11/2019 1141

## 2020-06-14 NOTE — Patient Instructions (Signed)
[facility] Discharge Instructions for Patients Receiving Chemotherapy  Today you received the following chemotherapy agents: carboplatin.  To help prevent nausea and vomiting after your treatment, we encourage you to take your nausea medication as directed.   If you develop nausea and vomiting that is not controlled by your nausea medication, call the clinic.   BELOW ARE SYMPTOMS THAT SHOULD BE REPORTED IMMEDIATELY:  *FEVER GREATER THAN 100.5 F  *CHILLS WITH OR WITHOUT FEVER  NAUSEA AND VOMITING THAT IS NOT CONTROLLED WITH YOUR NAUSEA MEDICATION  *UNUSUAL SHORTNESS OF BREATH  *UNUSUAL BRUISING OR BLEEDING  TENDERNESS IN MOUTH AND THROAT WITH OR WITHOUT PRESENCE OF ULCERS  *URINARY PROBLEMS  *BOWEL PROBLEMS  UNUSUAL RASH Items with * indicate a potential emergency and should be followed up as soon as possible.  Feel free to call the clinic should you have any questions or concerns. The clinic phone number is [phone].  Please show the CHEMO ALERT CARD at check-in to the Emergency Department and triage nurse.   

## 2020-06-14 NOTE — Telephone Encounter (Signed)
Scheduled appointments per 10/1 los. Gave patient calendar print out.

## 2020-06-14 NOTE — Assessment & Plan Note (Signed)
Since recent dose adjustment, her thrombocytopenia improved She is only mildly anemic Observe for now

## 2020-06-14 NOTE — Assessment & Plan Note (Signed)
She tolerated chemotherapy well  Since recent dose adjustment, her blood counts are better She will proceed with treatment today as scheduled I plan to order CT imaging before her next visit for objective assessment of response to therapy

## 2020-06-14 NOTE — Assessment & Plan Note (Signed)
She has not lost weight since last time I saw her Observe closely for now She will try to maintain her weight 

## 2020-07-08 ENCOUNTER — Encounter (HOSPITAL_COMMUNITY): Payer: Self-pay

## 2020-07-08 ENCOUNTER — Ambulatory Visit (HOSPITAL_COMMUNITY)
Admission: RE | Admit: 2020-07-08 | Discharge: 2020-07-08 | Disposition: A | Discharge status: Home / Self Care | Payer: Medicare Other | Source: Ambulatory Visit | Attending: Hematology and Oncology | Admitting: Hematology and Oncology

## 2020-07-08 ENCOUNTER — Other Ambulatory Visit: Payer: Self-pay

## 2020-07-08 DIAGNOSIS — C539 Malignant neoplasm of cervix uteri, unspecified: Secondary | ICD-10-CM | POA: Insufficient documentation

## 2020-07-08 MED ORDER — IOHEXOL 300 MG/ML  SOLN
75.0000 mL | Freq: Once | INTRAMUSCULAR | Status: AC | PRN
  Administered 2020-07-08: 75 mL via INTRAVENOUS

## 2020-07-11 ENCOUNTER — Encounter: Payer: Self-pay | Admitting: Hematology and Oncology

## 2020-07-11 ENCOUNTER — Other Ambulatory Visit: Payer: Self-pay | Admitting: Hematology and Oncology

## 2020-07-11 ENCOUNTER — Other Ambulatory Visit: Payer: Self-pay

## 2020-07-11 ENCOUNTER — Inpatient Hospital Stay: Payer: Medicare Other

## 2020-07-11 ENCOUNTER — Inpatient Hospital Stay (HOSPITAL_BASED_OUTPATIENT_CLINIC_OR_DEPARTMENT_OTHER): Payer: Medicare Other | Admitting: Hematology and Oncology

## 2020-07-11 DIAGNOSIS — Z7189 Other specified counseling: Secondary | ICD-10-CM

## 2020-07-11 DIAGNOSIS — D61818 Other pancytopenia: Secondary | ICD-10-CM | POA: Diagnosis not present

## 2020-07-11 DIAGNOSIS — C539 Malignant neoplasm of cervix uteri, unspecified: Secondary | ICD-10-CM

## 2020-07-11 DIAGNOSIS — R64 Cachexia: Secondary | ICD-10-CM

## 2020-07-11 DIAGNOSIS — Z5111 Encounter for antineoplastic chemotherapy: Secondary | ICD-10-CM | POA: Diagnosis not present

## 2020-07-11 LAB — CBC WITH DIFFERENTIAL (CANCER CENTER ONLY)
Abs Immature Granulocytes: 0.03 10*3/uL (ref 0.00–0.07)
Basophils Absolute: 0 10*3/uL (ref 0.0–0.1)
Basophils Relative: 0 %
Eosinophils Absolute: 0 10*3/uL (ref 0.0–0.5)
Eosinophils Relative: 0 %
HCT: 34.5 % — ABNORMAL LOW (ref 36.0–46.0)
Hemoglobin: 11.5 g/dL — ABNORMAL LOW (ref 12.0–15.0)
Immature Granulocytes: 1 %
Lymphocytes Relative: 18 %
Lymphs Abs: 0.8 10*3/uL (ref 0.7–4.0)
MCH: 31.9 pg (ref 26.0–34.0)
MCHC: 33.3 g/dL (ref 30.0–36.0)
MCV: 95.8 fL (ref 80.0–100.0)
Monocytes Absolute: 0.4 10*3/uL (ref 0.1–1.0)
Monocytes Relative: 10 %
Neutro Abs: 2.9 10*3/uL (ref 1.7–7.7)
Neutrophils Relative %: 71 %
Platelet Count: 111 10*3/uL — ABNORMAL LOW (ref 150–400)
RBC: 3.6 MIL/uL — ABNORMAL LOW (ref 3.87–5.11)
RDW: 16.4 % — ABNORMAL HIGH (ref 11.5–15.5)
WBC Count: 4.2 10*3/uL (ref 4.0–10.5)
nRBC: 0 % (ref 0.0–0.2)

## 2020-07-11 LAB — COMPREHENSIVE METABOLIC PANEL
ALT: 7 U/L (ref 0–44)
AST: 13 U/L — ABNORMAL LOW (ref 15–41)
Albumin: 3.8 g/dL (ref 3.5–5.0)
Alkaline Phosphatase: 67 U/L (ref 38–126)
Anion gap: 7 (ref 5–15)
BUN: 12 mg/dL (ref 8–23)
CO2: 25 mmol/L (ref 22–32)
Calcium: 8.9 mg/dL (ref 8.9–10.3)
Chloride: 107 mmol/L (ref 98–111)
Creatinine, Ser: 0.71 mg/dL (ref 0.44–1.00)
GFR, Estimated: >60 mL/min (ref >60)
Glucose, Bld: 86 mg/dL (ref 70–99)
Potassium: 4.2 mmol/L (ref 3.5–5.1)
Sodium: 139 mmol/L (ref 135–145)
Total Bilirubin: 0.3 mg/dL (ref 0.3–1.2)
Total Protein: 6.7 g/dL (ref 6.5–8.1)

## 2020-07-11 MED ORDER — PALONOSETRON HCL INJECTION 0.25 MG/5ML
0.2500 mg | Freq: Once | INTRAVENOUS | Status: AC
  Administered 2020-07-11: 0.25 mg via INTRAVENOUS

## 2020-07-11 MED ORDER — SODIUM CHLORIDE 0.9 % IV SOLN
265.9500 mg | Freq: Once | INTRAVENOUS | Status: AC
  Administered 2020-07-11: 270 mg via INTRAVENOUS
  Filled 2020-07-11: qty 27

## 2020-07-11 MED ORDER — SODIUM CHLORIDE 0.9 % IV SOLN
10.0000 mg | Freq: Once | INTRAVENOUS | Status: AC
  Administered 2020-07-11: 10 mg via INTRAVENOUS
  Filled 2020-07-11: qty 10

## 2020-07-11 MED ORDER — SODIUM CHLORIDE 0.9 % IV SOLN
Freq: Once | INTRAVENOUS | Status: AC
  Filled 2020-07-11: qty 250

## 2020-07-11 MED ORDER — SODIUM CHLORIDE 0.9 % IV SOLN
150.0000 mg | Freq: Once | INTRAVENOUS | Status: AC
  Administered 2020-07-11: 150 mg via INTRAVENOUS
  Filled 2020-07-11: qty 150

## 2020-07-11 NOTE — Progress Notes (Signed)
[place] OFFICE PROGRESS NOTE  Patient Care Team: Patient, [name] as PCP - General (General Practice)  ASSESSMENT & PLAN:  Malignant neoplasm of cervix ([place]) I reviewed multiple CT imaging with the patient She has stable disease on recent CT She tolerated chemotherapy fairly well without major side effects apart from very mild pancytopenia I recommend we proceed with 3 more months of treatment before repeating another imaging study She is in agreement to proceed  Acquired pancytopenia ([place]) She had fluctuation of thrombocytopenia, asymptomatic She is only mildly anemic Observe for now  Cachexia [facility]) She has not lost weight since last time I saw her Observe closely for now She will try to maintain her weight  Goals of care, counseling/discussion She understood the goals of care is palliative in nature   No orders of the defined types were placed in this encounter.   All questions were answered. The patient knows to call the clinic with any problems, questions or concerns. The total time spent in the appointment was 30 minutes encounter with patients including review of chart and various tests results, discussions about plan of care and coordination of care plan   [name], MD 07/11/2020 12:09 PM  INTERVAL HISTORY: Please see below for problem oriented charting. She returns for chemotherapy and review of CT imaging She tolerated last cycle of treatment well No nausea Denies vaginal bleeding or pelvic pain No infusion reaction  SUMMARY OF ONCOLOGIC HISTORY: Oncology History Overview Note  Cancer Staging Malignant neoplasm of cervix ([place]) Staging form: Cervix Uteri, AJCC 8th Edition - Clinical: Stage IIIB (cT3b, cN1, cM0) - Signed by [name], MD on 05/04/2018  PD-L1 1%   Malignant neoplasm of cervix ([place])  03/13/2018 Imaging   US pelvis There are mobile echogenic foci within the endometrial cavity suggestive of gas. This is nonspecific in  etiology however may be secondary to endometritis. Correlate for history of recent instrumentation. This obscures visualization of the endometrial tissue and therefore a follow-up pelvic ultrasound in 2-4 weeks is recommended after resolution of the acute symptomatology if symptoms persist to further evaluate the endometrium.    03/13/2018 Initial Diagnosis   She initially presented with PMB   04/12/2018 Pathology Results   Cervix, biopsy, mass - INVASIVE SQUAMOUS CELL CARCINOMA - SEE COMMENT   04/12/2018 Surgery   PREOPERATIVE DIAGNOSIS:  Postmenopausal vaginal bleeding. POSTOPERATIVE DIAGNOSIS: The same PROCEDURE: Exam under anesthesia, pap smear, cervical mass biopsy SURGEON:  Dr. [name]  INDICATIONS: 69 y.o. yo [id number] with PMB here for exam under anesthesia.Risks of surgery were discussed with the patient including but not limited to: bleeding which may require transfusion; infection which may require antibiotics; injury to uterus or surrounding organs; need for additional procedures including laparotomy or laparoscopy; and other postoperative/anesthesia complications. Written informed consent was obtained.    FINDINGS:  An 8-week size midline uterus.  No adnexal mass palpable on exam. Normal cervix not visualized. Friable mass seen in involving the vagina at the level of the cervix obliterating the anterior and posterior fornix.  ANESTHESIA:   General INTRAVENOUS FLUIDS:  300 ml of LR ESTIMATED BLOOD LOSS: 20 ml. SPECIMENS: pap smear, cervical mass biopsy COMPLICATIONS:  None immediate.    04/25/2018 PET scan   1. Large cervical mass may invade into the myometrium in down into the vagina, maximum SUV 21.3. There is a malignant left external iliac node measuring 1.4 cm in short axis with maximum SUV 14.1. 2. Accentuated activity in the cecum  and ascending colon is likely physiologic given that it has no CT correlate. Correlation with the patient's colon cancer screening  history is recommended. If screening is not up-to-date, appropriate screening should be considered. 3.  Aortic Atherosclerosis (ICD10-I70.0).    04/28/2018 Surgery   Pre-operative Diagnosis:  At least Stage 2B SCCa Cervical Cancer  Post-operative Diagnosis: At least clinical Stage 3A SCCa Cervical Cancer  Operation:  Exam under anesthesia due to intolerance for vaginal exam in office Cystoscopy due to concern for extension to posterior bladder (from anterior vaginal wall lesion)  Operative Findings:   1. Parametrial involvement on right  2. Right sided subvaginal tumor burden ~3cm. This approximates the right ureteral insertion into the bladder based on my exam during cystoscopy. 3. Extension of disease down upper half of vagina on lateral and posterior walls. 4. Extension of disease down to lower third of anterior vagina (versus skip lesion) thus making her Stage 3A 5. Cystoscopy revealed patent bilateral ureteral orifices and no bladder mucosa invasion or areas of concern. 6. Rectal exam grossly no disease.    05/04/2018 Cancer Staging   Staging form: Cervix Uteri, AJCC 8th Edition - Clinical: Stage IIIB (cT3b, cN1, cM0) - Signed by [name], MD on 05/04/2018   05/12/2018 Procedure   Successful 8 French right internal jugular vein power port placement with its tip at the SVC/RA junction   05/13/2018 - 06/24/2018 Chemotherapy   The patient had weekly cisplatin given with concurrent radiation    05/17/2018 - 07/27/2018 Radiation Therapy   Radiation treatment dates:05/17/2018-07/27/2018  Site/dose:1. Cervix, 1.8 Gy in 25 fractions for a total dose of 45 Gy 2. Pelvis  Boost, 1.8 Gy in 5 fractions for a total dose of 9 Gy 3. Cervix,5.5Gy in 5 fractions for a total dose of 27.5Gy   06/03/2018 Adverse Reaction   She missed her chemo due to severe depression and was sent to the ER for suicidal ideation.  She was seen by psychiatrist.    10/27/2018 PET scan   IMPRESSION: 1. Complete metabolic response to therapy, without residual or recurrent hypermetabolic disease. 2.  Aortic Atherosclerosis (ICD10-I70.0).    11/17/2018 Imaging   Successful right IJ vein Port-A-Cath explant.   03/29/2020 PET scan   1. Hypermetabolic lesion along the right vaginal cuff is highly worrisome for recurrent cervical cancer. No evidence of distant metastatic disease. 2.  Aortic atherosclerosis (ICD10-I70.0).   04/26/2020 -  Chemotherapy   The patient had CARBOplatin  for chemotherapy treatment.      Genetic Testing   Patient has genetic testing done for PD-L1. Results revealed patient has the following:  PD-L1 combined positive score (CPS): 1%   07/08/2020 Imaging   1. Unchanged post treatment appearance of the pelvis, including a soft tissue nodule in the low right hemipelvis adjacent to the vagina measuring 2.8 x 1.7 cm, previously FDG avid and consistent with malignancy. 2. No evidence of nodal or distant metastatic disease in the abdomen or pelvis. 3. Aortic Atherosclerosis (ICD10-I70.0).     REVIEW OF SYSTEMS:   Constitutional: Denies fevers, chills or abnormal weight loss Eyes: Denies blurriness of vision Ears, nose, mouth, throat, and face: Denies mucositis or sore throat Respiratory: Denies cough, dyspnea or wheezes Cardiovascular: Denies palpitation, chest discomfort or lower extremity swelling Gastrointestinal:  Denies nausea, heartburn or change in bowel habits Skin: Denies abnormal skin rashes Lymphatics: Denies new lymphadenopathy or easy bruising Neurological:Denies numbness, tingling or new weaknesses Behavioral/Psych: Mood is stable, no new changes  All other systems  were reviewed with the patient and are negative.  I have reviewed the past medical history, past surgical history, social history and family history with the patient and they are unchanged from previous note.  ALLERGIES:  has No Known  Allergies.  MEDICATIONS:  Current Outpatient Medications  Medication Sig Dispense Refill  . Multiple Vitamin (MULTIVITAMIN) tablet Take 1 tablet by mouth daily.    . ondansetron (ZOFRAN) 8 MG tablet Take 1 tablet (8 mg total) by mouth every 8 (eight) hours as needed for refractory nausea / vomiting. Start on day 3 after carboplatin chemo. 30 tablet 1  . prochlorperazine (COMPAZINE) 10 MG tablet Take 1 tablet (10 mg total) by mouth every 6 (six) hours as needed (Nausea or vomiting). 30 tablet 1   No current facility-administered medications for this visit.   Facility-Administered Medications Ordered in Other Visits  Medication Dose Route Frequency Provider Last Rate Last Admin  . CARBOplatin (PARAPLATIN) 270 mg in sodium chloride 0.9 % 250 mL chemo infusion  270 mg Intravenous Once Alvy Bimler, Cydnee Fuquay, MD      . dexamethasone (DECADRON) 10 mg in sodium chloride 0.9 % 50 mL IVPB  10 mg Intravenous Once Alvy Bimler, Jaeveon [place]land, MD 204 mL/hr at 07/11/20 1200 10 mg at 07/11/20 1200  . fosaprepitant (EMEND) 150 mg in sodium chloride 0.9 % 145 mL IVPB  150 mg Intravenous Once Alvy Bimler, Meika Earll, MD        PHYSICAL EXAMINATION: ECOG PERFORMANCE STATUS: 1 - Symptomatic but completely ambulatory  Vitals:   07/11/20 1110  BP: (!) 123/59  Pulse: 80  Resp: 20  Temp: (!) 97.2 F (36.2 C)  SpO2: 100%   Filed Weights   07/11/20 1110  Weight: 88 lb 6.4 oz (40.1 kg)    GENERAL:alert, no distress and comfortable SKIN: skin color, texture, turgor are normal, no rashes or significant lesions EYES: normal, Conjunctiva are pink and non-injected, sclera clear OROPHARYNX:no exudate, no erythema and lips, buccal mucosa, and tongue normal  NECK: supple, thyroid normal size, non-tender, without nodularity LYMPH:  no palpable lymphadenopathy in the cervical, axillary or inguinal LUNGS: clear to auscultation and percussion with normal breathing effort HEART: regular rate & rhythm and no murmurs and no lower extremity  edema ABDOMEN:abdomen soft, non-tender and normal bowel sounds Musculoskeletal:no cyanosis of digits and no clubbing  NEURO: alert & oriented x 3 with fluent speech, no focal motor/sensory deficits  LABORATORY DATA:  I have reviewed the data as listed    Component Value Date/Time   NA 139 07/11/2020 1045   K 4.2 07/11/2020 1045   CL 107 07/11/2020 1045   CO2 25 07/11/2020 1045   GLUCOSE 86 07/11/2020 1045   BUN 12 07/11/2020 1045   CREATININE 0.71 07/11/2020 1045   CREATININE 0.83 03/11/2020 0951   CALCIUM 8.9 07/11/2020 1045   PROT 6.7 07/11/2020 1045   ALBUMIN 3.8 07/11/2020 1045   AST 13 (L) 07/11/2020 1045   AST 14 (L) 12/11/2019 1141   ALT 7 07/11/2020 1045   ALT 7 12/11/2019 1141   ALKPHOS 67 07/11/2020 1045   BILITOT 0.3 07/11/2020 1045   BILITOT 0.4 12/11/2019 1141   GFRNONAA >60 07/11/2020 1045   GFRNONAA >60 03/11/2020 0951   GFRAA >60 06/14/2020 0844   GFRAA >60 03/11/2020 0951    No results found for: SPEP, UPEP  Lab Results  Component Value Date   WBC 4.2 07/11/2020   NEUTROABS 2.9 07/11/2020   HGB 11.5 (L) 07/11/2020   HCT 34.5 (L) 07/11/2020  MCV 95.8 07/11/2020   PLT 111 (L) 07/11/2020      Chemistry      Component Value Date/Time   NA 139 07/11/2020 1045   K 4.2 07/11/2020 1045   CL 107 07/11/2020 1045   CO2 25 07/11/2020 1045   BUN 12 07/11/2020 1045   CREATININE 0.71 07/11/2020 1045   CREATININE 0.83 03/11/2020 0951      Component Value Date/Time   CALCIUM 8.9 07/11/2020 1045   ALKPHOS 67 07/11/2020 1045   AST 13 (L) 07/11/2020 1045   AST 14 (L) 12/11/2019 1141   ALT 7 07/11/2020 1045   ALT 7 12/11/2019 1141   BILITOT 0.3 07/11/2020 1045   BILITOT 0.4 12/11/2019 1141       RADIOGRAPHIC STUDIES: I have reviewed multiple imaging studies with the patient I have personally reviewed the radiological images as listed and agreed with the findings in the report. CT ABDOMEN PELVIS W CONTRAST  Result Date: 07/08/2020 CLINICAL DATA:   Cervical cancer, assess treatment response EXAM: CT ABDOMEN AND PELVIS WITH CONTRAST TECHNIQUE: Multidetector CT imaging of the abdomen and pelvis was performed using the standard protocol following bolus administration of intravenous contrast. CONTRAST:  46mL OMNIPAQUE IOHEXOL 300 MG/ML SOLN, additional oral enteric contrast COMPARISON:  PET-CT, 03/29/2020, CT abdomen pelvis, 03/14/2020, 04/19/2019 FINDINGS: Lower chest: No acute abnormality. Hepatobiliary: No solid liver abnormality is seen. No gallstones, gallbladder wall thickening, or biliary dilatation. Pancreas: Unremarkable. No pancreatic ductal dilatation or surrounding inflammatory changes. Spleen: Normal in size without significant abnormality. Adrenals/Urinary Tract: Adrenal glands are unremarkable. Kidneys are normal, without renal calculi, solid lesion, or hydronephrosis. Bladder is unremarkable. Stomach/Bowel: Stomach is within normal limits. Appendix is not clearly visualized and may be surgically absent. No evidence of bowel wall thickening, distention, or inflammatory changes. Sigmoid diverticulosis. Vascular/Lymphatic: Aortic atherosclerosis. No enlarged abdominal or pelvic lymph nodes. Reproductive: Unchanged post treatment appearance of the pelvis, including a soft tissue nodule in the low right hemipelvis adjacent to the vagina measuring 2.8 x 1.7 cm (series 2, image 67). Other: No abdominal wall hernia or abnormality. No abdominopelvic ascites. Musculoskeletal: No acute or significant osseous findings. Unchanged superior endplate Schmorl type deformity of the L5 vertebral body. IMPRESSION: 1. Unchanged post treatment appearance of the pelvis, including a soft tissue nodule in the low right hemipelvis adjacent to the vagina measuring 2.8 x 1.7 cm, previously FDG avid and consistent with malignancy. 2. No evidence of nodal or distant metastatic disease in the abdomen or pelvis. 3. Aortic Atherosclerosis (ICD10-I70.0). Electronically Signed   By:  Eddie Candle M.D.   On: 07/08/2020 12:43

## 2020-07-11 NOTE — Assessment & Plan Note (Signed)
She understood the goals of care is palliative in nature

## 2020-07-11 NOTE — Assessment & Plan Note (Signed)
She had fluctuation of thrombocytopenia, asymptomatic She is only mildly anemic Observe for now

## 2020-07-11 NOTE — Assessment & Plan Note (Signed)
She has not lost weight since last time I saw her Observe closely for now She will try to maintain her weight

## 2020-07-11 NOTE — Assessment & Plan Note (Signed)
I reviewed multiple CT imaging with the patient She has stable disease on recent CT She tolerated chemotherapy fairly well without major side effects apart from very mild pancytopenia I recommend we proceed with 3 more months of treatment before repeating another imaging study She is in agreement to proceed

## 2020-07-11 NOTE — Patient Instructions (Signed)
[facility] Discharge Instructions for Patients Receiving Chemotherapy  Today you received the following chemotherapy agents: carboplatin.  To help prevent nausea and vomiting after your treatment, we encourage you to take your nausea medication as directed.   If you develop nausea and vomiting that is not controlled by your nausea medication, call the clinic.   BELOW ARE SYMPTOMS THAT SHOULD BE REPORTED IMMEDIATELY:  *FEVER GREATER THAN 100.5 F  *CHILLS WITH OR WITHOUT FEVER  NAUSEA AND VOMITING THAT IS NOT CONTROLLED WITH YOUR NAUSEA MEDICATION  *UNUSUAL SHORTNESS OF BREATH  *UNUSUAL BRUISING OR BLEEDING  TENDERNESS IN MOUTH AND THROAT WITH OR WITHOUT PRESENCE OF ULCERS  *URINARY PROBLEMS  *BOWEL PROBLEMS  UNUSUAL RASH Items with * indicate a potential emergency and should be followed up as soon as possible.  Feel free to call the clinic should you have any questions or concerns. The clinic phone number is [phone].  Please show the CHEMO ALERT CARD at check-in to the Emergency Department and triage nurse.   

## 2020-08-02 ENCOUNTER — Inpatient Hospital Stay: Payer: Medicare Other

## 2020-08-02 ENCOUNTER — Telehealth: Payer: Self-pay | Admitting: Hematology and Oncology

## 2020-08-02 ENCOUNTER — Inpatient Hospital Stay: Payer: Medicare Other | Attending: Gynecologic Oncology | Admitting: Hematology and Oncology

## 2020-08-02 ENCOUNTER — Other Ambulatory Visit: Payer: Self-pay

## 2020-08-02 ENCOUNTER — Encounter: Payer: Self-pay | Admitting: Hematology and Oncology

## 2020-08-02 DIAGNOSIS — Z923 Personal history of irradiation: Secondary | ICD-10-CM | POA: Diagnosis not present

## 2020-08-02 DIAGNOSIS — C539 Malignant neoplasm of cervix uteri, unspecified: Secondary | ICD-10-CM | POA: Insufficient documentation

## 2020-08-02 DIAGNOSIS — D61818 Other pancytopenia: Secondary | ICD-10-CM | POA: Insufficient documentation

## 2020-08-02 DIAGNOSIS — I700 Atherosclerosis of aorta: Secondary | ICD-10-CM | POA: Diagnosis not present

## 2020-08-02 DIAGNOSIS — R64 Cachexia: Secondary | ICD-10-CM

## 2020-08-02 DIAGNOSIS — Z9221 Personal history of antineoplastic chemotherapy: Secondary | ICD-10-CM | POA: Insufficient documentation

## 2020-08-02 DIAGNOSIS — Z7189 Other specified counseling: Secondary | ICD-10-CM

## 2020-08-02 LAB — CBC WITH DIFFERENTIAL (CANCER CENTER ONLY)
Abs Immature Granulocytes: 0.01 10*3/uL (ref 0.00–0.07)
Basophils Absolute: 0 10*3/uL (ref 0.0–0.1)
Basophils Relative: 0 %
Eosinophils Absolute: 0 10*3/uL (ref 0.0–0.5)
Eosinophils Relative: 0 %
HCT: 32.6 % — ABNORMAL LOW (ref 36.0–46.0)
Hemoglobin: 11 g/dL — ABNORMAL LOW (ref 12.0–15.0)
Immature Granulocytes: 0 %
Lymphocytes Relative: 25 %
Lymphs Abs: 0.7 10*3/uL (ref 0.7–4.0)
MCH: 33.1 pg (ref 26.0–34.0)
MCHC: 33.7 g/dL (ref 30.0–36.0)
MCV: 98.2 fL (ref 80.0–100.0)
Monocytes Absolute: 0.5 10*3/uL (ref 0.1–1.0)
Monocytes Relative: 17 %
Neutro Abs: 1.7 10*3/uL (ref 1.7–7.7)
Neutrophils Relative %: 58 %
Platelet Count: 76 10*3/uL — ABNORMAL LOW (ref 150–400)
RBC: 3.32 MIL/uL — ABNORMAL LOW (ref 3.87–5.11)
RDW: 15.9 % — ABNORMAL HIGH (ref 11.5–15.5)
WBC Count: 2.9 10*3/uL — ABNORMAL LOW (ref 4.0–10.5)
nRBC: 0 % (ref 0.0–0.2)

## 2020-08-02 LAB — COMPREHENSIVE METABOLIC PANEL
ALT: <6 U/L (ref 0–44)
AST: 14 U/L — ABNORMAL LOW (ref 15–41)
Albumin: 3.9 g/dL (ref 3.5–5.0)
Alkaline Phosphatase: 71 U/L (ref 38–126)
Anion gap: 6 (ref 5–15)
BUN: 17 mg/dL (ref 8–23)
CO2: 28 mmol/L (ref 22–32)
Calcium: 8.7 mg/dL — ABNORMAL LOW (ref 8.9–10.3)
Chloride: 107 mmol/L (ref 98–111)
Creatinine, Ser: 0.84 mg/dL (ref 0.44–1.00)
GFR, Estimated: >60 mL/min (ref >60)
Glucose, Bld: 85 mg/dL (ref 70–99)
Potassium: 3.8 mmol/L (ref 3.5–5.1)
Sodium: 141 mmol/L (ref 135–145)
Total Bilirubin: 0.4 mg/dL (ref 0.3–1.2)
Total Protein: 6.9 g/dL (ref 6.5–8.1)

## 2020-08-02 NOTE — Assessment & Plan Note (Signed)
She has developed significant pancytopenia since her last time I saw her This is likely due to cumulative side effects of treatment as well as her recent weight loss I will cancel her treatment and reschedule her appointment to almost 2 weeks from now per patient request I plan to reduce the carboplatin to AUC of 4

## 2020-08-02 NOTE — Assessment & Plan Note (Signed)
She has recent mild weight loss that likely contributed to her severe pancytopenia I will adjust the dose of chemotherapy accordingly

## 2020-08-02 NOTE — Assessment & Plan Note (Signed)
This is due to cumulative side effects of treatment causing bone marrow suppression in addition to her recent weight loss She is not symptomatic Observe closely for now

## 2020-08-02 NOTE — Telephone Encounter (Signed)
Scheduled appts per 11/19 staff msg for add on treatment. Pt's care giver confirmed appt date and time.

## 2020-08-02 NOTE — Progress Notes (Signed)
[facility] OFF[facility] PROGRESS NOTE  Patient Care Team: Patient, [name] as PCP - General (General Practice)  ASSESSMENT & PLAN:  Malignant neoplasm of cervix ([place]) She has developed significant pancytopenia since her last time I saw her This is likely due to cumulative side effects of treatment as well as her recent weight loss I will cancel her treatment and reschedule her appointment to almost 2 weeks from now per patient request I plan to reduce the carboplatin to AUC of 4  Cachexia ([place]) She has recent mild weight loss that likely contributed to her severe pancytopenia I will adjust the dose of chemotherapy accordingly  Pancytopenia, acquired ([place]) This is due to cumulative side effects of treatment causing bone marrow suppression in addition to her recent weight loss She is not symptomatic Observe closely for now   No orders of the defined types were placed in this encounter.   All questions were answered. The patient knows to call the clinic with any problems, questions or concerns. The total time spent in the appointment was 30 minutes encounter with patients including review of chart and various tests results, discussions about plan of care and coordination of care plan   [name], MD 08/02/2020 12:39 PM  INTERVAL HISTORY: Please see below for problem oriented charting. She returns for further follow-up She has lost some weight She attributed that to recent home situations Denies nausea or constipation No recent infection, fever or chills  The patient denies any recent signs or symptoms of bleeding such as spontaneous epistaxis, hematuria or hematochezia.   SUMMARY OF ONCOLOGIC HISTORY: Oncology History Overview Note  Cancer Staging Malignant neoplasm of cervix ([place]) Staging form: Cervix Uteri, AJCC 8th Edition - Clinical: Stage IIIB (cT3b, cN1, cM0) - Signed by [name], MD on 05/04/2018  PD-L1 1%   Malignant neoplasm of cervix ([place])   03/13/2018 Imaging   US pelvis There are mobile echogenic foci within the endometrial cavity suggestive of gas. This is nonspecific in etiology however may be secondary to endometritis. Correlate for history of recent instrumentation. This obscures visualization of the endometrial tissue and therefore a follow-up pelvic ultrasound in 2-4 weeks is recommended after resolution of the acute symptomatology if symptoms persist to further evaluate the endometrium.    03/13/2018 Initial Diagnosis   She initially presented with PMB   04/12/2018 Pathology Results   Cervix, biopsy, mass - INVASIVE SQUAMOUS CELL CARCINOMA - SEE COMMENT   04/12/2018 Surgery   PREOPERATIVE DIAGNOSIS:  Postmenopausal vaginal bleeding. POSTOPERATIVE DIAGNOSIS: The same PROCEDURE: Exam under anesthesia, pap smear, cervical mass biopsy SURGEON:  Dr. [name]  INDICATIONS: 69 y.o. yo [id number] with PMB here for exam under anesthesia.Risks of surgery were discussed with the patient including but not limited to: bleeding which may require transfusion; infection which may require antibiotics; injury to uterus or surrounding organs; need for additional procedures including laparotomy or laparoscopy; and other postoperative/anesthesia complications. Written informed consent was obtained.    FINDINGS:  An 8-week size midline uterus.  No adnexal mass palpable on exam. Normal cervix not visualized. Friable mass seen in involving the vagina at the level of the cervix obliterating the anterior and posterior fornix.  ANESTHESIA:   General INTRAVENOUS FLUIDS:  300 ml of LR ESTIMATED BLOOD LOSS: 20 ml. SPECIMENS: pap smear, cervical mass biopsy COMPLICATIONS:  None immediate.    04/25/2018 PET scan   1. Large cervical mass may invade into the myometrium in down into the vagina, maximum SUV 21.3.  There is a malignant left external iliac node measuring 1.4 cm in short axis with maximum SUV 14.1. 2. Accentuated activity in the  cecum and ascending colon is likely physiologic given that it has no CT correlate. Correlation with the patient's colon cancer screening history is recommended. If screening is not up-to-date, appropriate screening should be considered. 3.  Aortic Atherosclerosis (ICD10-I70.0).    04/28/2018 Surgery   Pre-operative Diagnosis:  At least Stage 2B SCCa Cervical Cancer  Post-operative Diagnosis: At least clinical Stage 3A SCCa Cervical Cancer  Operation:  Exam under anesthesia due to intolerance for vaginal exam in office Cystoscopy due to concern for extension to posterior bladder (from anterior vaginal wall lesion)  Operative Findings:   1. Parametrial involvement on right  2. Right sided subvaginal tumor burden ~3cm. This approximates the right ureteral insertion into the bladder based on my exam during cystoscopy. 3. Extension of disease down upper half of vagina on lateral and posterior walls. 4. Extension of disease down to lower third of anterior vagina (versus skip lesion) thus making her Stage 3A 5. Cystoscopy revealed patent bilateral ureteral orifices and no bladder mucosa invasion or areas of concern. 6. Rectal exam grossly no disease.    05/04/2018 Cancer Staging   Staging form: Cervix Uteri, AJCC 8th Edition - Clinical: Stage IIIB (cT3b, cN1, cM0) - Signed by [name], MD on 05/04/2018   05/12/2018 Procedure   Successful 8 French right internal jugular vein power port placement with its tip at the SVC/RA junction   05/13/2018 - 06/24/2018 Chemotherapy   The patient had weekly cisplatin given with concurrent radiation    05/17/2018 - 07/27/2018 Radiation Therapy   Radiation treatment dates:05/17/2018-07/27/2018  Site/dose:1. Cervix, 1.8 Gy in 25 fractions for a total dose of 45 Gy 2. Pelvis  Boost, 1.8 Gy in 5 fractions for a total dose of 9 Gy 3. Cervix,5.5Gy in 5 fractions for a total dose of 27.5Gy   06/03/2018 Adverse  Reaction   She missed her chemo due to severe depression and was sent to the ER for suicidal ideation.  She was seen by psychiatrist.   10/27/2018 PET scan   IMPRESSION: 1. Complete metabolic response to therapy, without residual or recurrent hypermetabolic disease. 2.  Aortic Atherosclerosis (ICD10-I70.0).    11/17/2018 Imaging   Successful right IJ vein Port-A-Cath explant.   03/29/2020 PET scan   1. Hypermetabolic lesion along the right vaginal cuff is highly worrisome for recurrent cervical cancer. No evidence of distant metastatic disease. 2.  Aortic atherosclerosis (ICD10-I70.0).   04/26/2020 -  Chemotherapy   The patient had CARBOplatin  for chemotherapy treatment.      Genetic Testing   Patient has genetic testing done for PD-L1. Results revealed patient has the following:  PD-L1 combined positive score (CPS): 1%   07/08/2020 Imaging   1. Unchanged post treatment appearance of the pelvis, including a soft tissue nodule in the low right hemipelvis adjacent to the vagina measuring 2.8 x 1.7 cm, previously FDG avid and consistent with malignancy. 2. No evidence of nodal or distant metastatic disease in the abdomen or pelvis. 3. Aortic Atherosclerosis (ICD10-I70.0).     REVIEW OF SYSTEMS:   Constitutional: Denies fevers, chills  Eyes: Denies blurriness of vision Ears, nose, mouth, throat, and face: Denies mucositis or sore throat Respiratory: Denies cough, dyspnea or wheezes Cardiovascular: Denies palpitation, chest discomfort or lower extremity swelling Gastrointestinal:  Denies nausea, heartburn or change in bowel habits Skin: Denies abnormal skin rashes Lymphatics: Denies new  lymphadenopathy or easy bruising Neurological:Denies numbness, tingling or new weaknesses Behavioral/Psych: Mood is stable, no new changes  All other systems were reviewed with the patient and are negative.  I have reviewed the past medical history, past surgical history, social history and  family history with the patient and they are unchanged from previous note.  ALLERGIES:  has No Known Allergies.  MEDICATIONS:  Current Outpatient Medications  Medication Sig Dispense Refill  . Multiple Vitamin (MULTIVITAMIN) tablet Take 1 tablet by mouth daily.    . ondansetron (ZOFRAN) 8 MG tablet Take 1 tablet (8 mg total) by mouth every 8 (eight) hours as needed for refractory nausea / vomiting. Start on day 3 after carboplatin chemo. 30 tablet 1  . prochlorperazine (COMPAZINE) 10 MG tablet Take 1 tablet (10 mg total) by mouth every 6 (six) hours as needed (Nausea or vomiting). 30 tablet 1   No current facility-administered medications for this visit.    PHYSICAL EXAMINATION: ECOG PERFORMANCE STATUS: 1 - Symptomatic but completely ambulatory  Vitals:   08/02/20 1151  BP: 123/79  Pulse: 97  Resp: 18  Temp: 98.3 F (36.8 C)  SpO2: 100%   Filed Weights   08/02/20 1151  Weight: 85 lb 9.6 oz (38.8 kg)    GENERAL:alert, no distress and comfortable.  She looks thin and cachectic NEURO: alert & oriented x 3 with fluent speech, no focal motor/sensory deficits  LABORATORY DATA:  I have reviewed the data as listed    Component Value Date/Time   NA 141 08/02/2020 1132   K 3.8 08/02/2020 1132   CL 107 08/02/2020 1132   CO2 28 08/02/2020 1132   GLUCOSE 85 08/02/2020 1132   BUN 17 08/02/2020 1132   CREATININE 0.84 08/02/2020 1132   CREATININE 0.83 03/11/2020 0951   CALCIUM 8.7 (L) 08/02/2020 1132   PROT 6.9 08/02/2020 1132   ALBUMIN 3.9 08/02/2020 1132   AST 14 (L) 08/02/2020 1132   AST 14 (L) 12/11/2019 1141   ALT <6 08/02/2020 1132   ALT 7 12/11/2019 1141   ALKPHOS 71 08/02/2020 1132   BILITOT 0.4 08/02/2020 1132   BILITOT 0.4 12/11/2019 1141   GFRNONAA >60 08/02/2020 1132   GFRNONAA >60 03/11/2020 0951   GFRAA >60 06/14/2020 0844   GFRAA >60 03/11/2020 0951    No results found for: SPEP, UPEP  Lab Results  Component Value Date   WBC 2.9 (L) 08/02/2020    NEUTROABS 1.7 08/02/2020   HGB 11.0 (L) 08/02/2020   HCT 32.6 (L) 08/02/2020   MCV 98.2 08/02/2020   PLT 76 (L) 08/02/2020      Chemistry      Component Value Date/Time   NA 141 08/02/2020 1132   K 3.8 08/02/2020 1132   CL 107 08/02/2020 1132   CO2 28 08/02/2020 1132   BUN 17 08/02/2020 1132   CREATININE 0.84 08/02/2020 1132   CREATININE 0.83 03/11/2020 0951      Component Value Date/Time   CALCIUM 8.7 (L) 08/02/2020 1132   ALKPHOS 71 08/02/2020 1132   AST 14 (L) 08/02/2020 1132   AST 14 (L) 12/11/2019 1141   ALT <6 08/02/2020 1132   ALT 7 12/11/2019 1141   BILITOT 0.4 08/02/2020 1132   BILITOT 0.4 12/11/2019 1141

## 2020-08-14 ENCOUNTER — Inpatient Hospital Stay: Payer: Medicare Other

## 2020-08-14 ENCOUNTER — Telehealth: Payer: Self-pay

## 2020-08-14 NOTE — Telephone Encounter (Signed)
Called regarding missed appt today. Erin Kent said that Erin Kent must have misunderstood the appt reminder call and thought that today's appt was canceled.  Scheduling message sent to reschedule appts.

## 2020-08-15 ENCOUNTER — Telehealth: Payer: Self-pay | Admitting: Hematology and Oncology

## 2020-08-15 NOTE — Telephone Encounter (Signed)
R/s appt per 12/1 sch msg - unable to reach [facility]. Left message with appt date and time on vmail.

## 2020-08-23 ENCOUNTER — Ambulatory Visit: Payer: Medicare Other | Admitting: Hematology and Oncology

## 2020-08-23 ENCOUNTER — Other Ambulatory Visit: Payer: Self-pay

## 2020-08-23 ENCOUNTER — Inpatient Hospital Stay: Payer: Medicare Other | Attending: Gynecologic Oncology

## 2020-08-23 ENCOUNTER — Inpatient Hospital Stay: Payer: Medicare Other

## 2020-08-23 ENCOUNTER — Other Ambulatory Visit: Payer: Medicare Other

## 2020-08-23 ENCOUNTER — Ambulatory Visit: Payer: Medicare Other

## 2020-08-23 VITALS — BP 130/84 | HR 84 | Temp 98.9°F | Resp 17

## 2020-08-23 DIAGNOSIS — C539 Malignant neoplasm of cervix uteri, unspecified: Secondary | ICD-10-CM

## 2020-08-23 DIAGNOSIS — Z7189 Other specified counseling: Secondary | ICD-10-CM

## 2020-08-23 DIAGNOSIS — Z5111 Encounter for antineoplastic chemotherapy: Secondary | ICD-10-CM | POA: Diagnosis present

## 2020-08-23 LAB — COMPREHENSIVE METABOLIC PANEL
ALT: 8 U/L (ref 0–44)
AST: 18 U/L (ref 15–41)
Albumin: 3.8 g/dL (ref 3.5–5.0)
Alkaline Phosphatase: 75 U/L (ref 38–126)
Anion gap: 10 (ref 5–15)
BUN: 13 mg/dL (ref 8–23)
CO2: 24 mmol/L (ref 22–32)
Calcium: 9.3 mg/dL (ref 8.9–10.3)
Chloride: 106 mmol/L (ref 98–111)
Creatinine, Ser: 0.82 mg/dL (ref 0.44–1.00)
GFR, Estimated: >60 mL/min (ref >60)
Glucose, Bld: 89 mg/dL (ref 70–99)
Potassium: 4 mmol/L (ref 3.5–5.1)
Sodium: 140 mmol/L (ref 135–145)
Total Bilirubin: 0.3 mg/dL (ref 0.3–1.2)
Total Protein: 7.1 g/dL (ref 6.5–8.1)

## 2020-08-23 LAB — CBC WITH DIFFERENTIAL (CANCER CENTER ONLY)
Abs Immature Granulocytes: 0.02 10*3/uL (ref 0.00–0.07)
Basophils Absolute: 0 10*3/uL (ref 0.0–0.1)
Basophils Relative: 1 %
Eosinophils Absolute: 0 10*3/uL (ref 0.0–0.5)
Eosinophils Relative: 1 %
HCT: 33.8 % — ABNORMAL LOW (ref 36.0–46.0)
Hemoglobin: 11.2 g/dL — ABNORMAL LOW (ref 12.0–15.0)
Immature Granulocytes: 1 %
Lymphocytes Relative: 23 %
Lymphs Abs: 0.9 10*3/uL (ref 0.7–4.0)
MCH: 32.9 pg (ref 26.0–34.0)
MCHC: 33.1 g/dL (ref 30.0–36.0)
MCV: 99.4 fL (ref 80.0–100.0)
Monocytes Absolute: 0.6 10*3/uL (ref 0.1–1.0)
Monocytes Relative: 14 %
Neutro Abs: 2.5 10*3/uL (ref 1.7–7.7)
Neutrophils Relative %: 60 %
Platelet Count: 239 10*3/uL (ref 150–400)
RBC: 3.4 MIL/uL — ABNORMAL LOW (ref 3.87–5.11)
RDW: 13.9 % (ref 11.5–15.5)
WBC Count: 4.1 10*3/uL (ref 4.0–10.5)
nRBC: 0 % (ref 0.0–0.2)

## 2020-08-23 MED ORDER — PALONOSETRON HCL INJECTION 0.25 MG/5ML
INTRAVENOUS | Status: AC
  Filled 2020-08-23: qty 5

## 2020-08-23 MED ORDER — SODIUM CHLORIDE 0.9 % IV SOLN
Freq: Once | INTRAVENOUS | Status: AC
  Filled 2020-08-23: qty 250

## 2020-08-23 MED ORDER — SODIUM CHLORIDE 0.9 % IV SOLN
10.0000 mg | Freq: Once | INTRAVENOUS | Status: AC
  Administered 2020-08-23: 10 mg via INTRAVENOUS
  Filled 2020-08-23: qty 10
  Filled 2020-08-23: qty 1

## 2020-08-23 MED ORDER — PALONOSETRON HCL INJECTION 0.25 MG/5ML
0.2500 mg | Freq: Once | INTRAVENOUS | Status: AC
  Administered 2020-08-23: 0.25 mg via INTRAVENOUS

## 2020-08-23 MED ORDER — SODIUM CHLORIDE 0.9 % IV SOLN
232.0000 mg | Freq: Once | INTRAVENOUS | Status: AC
  Administered 2020-08-23: 230 mg via INTRAVENOUS
  Filled 2020-08-23: qty 23

## 2020-08-23 MED ORDER — SODIUM CHLORIDE 0.9 % IV SOLN
150.0000 mg | Freq: Once | INTRAVENOUS | Status: AC
  Administered 2020-08-23: 150 mg via INTRAVENOUS
  Filled 2020-08-23: qty 150
  Filled 2020-08-23: qty 5

## 2020-08-23 NOTE — Patient Instructions (Signed)
[facility] Discharge Instructions for Patients Receiving Chemotherapy  Today you received the following chemotherapy agents Carboplatin  To help prevent nausea and vomiting after your treatment, we encourage you to take your nausea medication as directed   If you develop nausea and vomiting that is not controlled by your nausea medication, call the clinic.   BELOW ARE SYMPTOMS THAT SHOULD BE REPORTED IMMEDIATELY:  *FEVER GREATER THAN 100.5 F  *CHILLS WITH OR WITHOUT FEVER  NAUSEA AND VOMITING THAT IS NOT CONTROLLED WITH YOUR NAUSEA MEDICATION  *UNUSUAL SHORTNESS OF BREATH  *UNUSUAL BRUISING OR BLEEDING  TENDERNESS IN MOUTH AND THROAT WITH OR WITHOUT PRESENCE OF ULCERS  *URINARY PROBLEMS  *BOWEL PROBLEMS  UNUSUAL RASH Items with * indicate a potential emergency and should be followed up as soon as possible.  Feel free to call the clinic should you have any questions or concerns. The clinic phone number is [phone].  Please show the CHEMO ALERT CARD at check-in to the Emergency Department and triage nurse.   

## 2020-08-23 NOTE — Progress Notes (Signed)
Per MD will lower Carb dose to 230mg  for more than 10% difference from previous dose due to slight change in Wt and SCr.

## 2020-08-26 ENCOUNTER — Ambulatory Visit: Payer: Medicare Other | Admitting: Gynecologic Oncology

## 2020-09-04 ENCOUNTER — Other Ambulatory Visit: Payer: Medicare Other

## 2020-09-04 ENCOUNTER — Ambulatory Visit: Payer: Medicare Other | Admitting: Hematology and Oncology

## 2020-09-04 ENCOUNTER — Ambulatory Visit: Payer: Medicare Other

## 2020-09-18 ENCOUNTER — Telehealth: Payer: Self-pay | Admitting: Hematology and Oncology

## 2020-09-18 ENCOUNTER — Inpatient Hospital Stay: Payer: Medicare Other | Attending: Gynecologic Oncology

## 2020-09-18 ENCOUNTER — Other Ambulatory Visit: Payer: Self-pay

## 2020-09-18 ENCOUNTER — Inpatient Hospital Stay: Payer: Medicare Other

## 2020-09-18 ENCOUNTER — Inpatient Hospital Stay (HOSPITAL_BASED_OUTPATIENT_CLINIC_OR_DEPARTMENT_OTHER): Payer: Medicare Other | Admitting: Hematology and Oncology

## 2020-09-18 VITALS — BP 129/77 | HR 93 | Temp 98.1°F | Resp 18 | Ht 62.0 in | Wt 87.7 lb

## 2020-09-18 DIAGNOSIS — C539 Malignant neoplasm of cervix uteri, unspecified: Secondary | ICD-10-CM

## 2020-09-18 DIAGNOSIS — R45851 Suicidal ideations: Secondary | ICD-10-CM | POA: Insufficient documentation

## 2020-09-18 DIAGNOSIS — R64 Cachexia: Secondary | ICD-10-CM

## 2020-09-18 DIAGNOSIS — F32A Depression, unspecified: Secondary | ICD-10-CM | POA: Diagnosis not present

## 2020-09-18 DIAGNOSIS — Z5111 Encounter for antineoplastic chemotherapy: Secondary | ICD-10-CM | POA: Insufficient documentation

## 2020-09-18 DIAGNOSIS — Z7189 Other specified counseling: Secondary | ICD-10-CM

## 2020-09-18 DIAGNOSIS — C569 Malignant neoplasm of unspecified ovary: Secondary | ICD-10-CM | POA: Insufficient documentation

## 2020-09-18 DIAGNOSIS — D61818 Other pancytopenia: Secondary | ICD-10-CM | POA: Diagnosis not present

## 2020-09-18 DIAGNOSIS — Z79899 Other long term (current) drug therapy: Secondary | ICD-10-CM | POA: Insufficient documentation

## 2020-09-18 DIAGNOSIS — I700 Atherosclerosis of aorta: Secondary | ICD-10-CM | POA: Diagnosis not present

## 2020-09-18 LAB — COMPREHENSIVE METABOLIC PANEL
ALT: 9 U/L (ref 0–44)
AST: 15 U/L (ref 15–41)
Albumin: 3.9 g/dL (ref 3.5–5.0)
Alkaline Phosphatase: 68 U/L (ref 38–126)
Anion gap: 11 (ref 5–15)
BUN: 14 mg/dL (ref 8–23)
CO2: 24 mmol/L (ref 22–32)
Calcium: 9.1 mg/dL (ref 8.9–10.3)
Chloride: 107 mmol/L (ref 98–111)
Creatinine, Ser: 0.83 mg/dL (ref 0.44–1.00)
GFR, Estimated: >60 mL/min (ref >60)
Glucose, Bld: 88 mg/dL (ref 70–99)
Potassium: 4 mmol/L (ref 3.5–5.1)
Sodium: 142 mmol/L (ref 135–145)
Total Bilirubin: 0.4 mg/dL (ref 0.3–1.2)
Total Protein: 6.9 g/dL (ref 6.5–8.1)

## 2020-09-18 LAB — CBC WITH DIFFERENTIAL (CANCER CENTER ONLY)
Abs Immature Granulocytes: 0.03 10*3/uL (ref 0.00–0.07)
Basophils Absolute: 0 10*3/uL (ref 0.0–0.1)
Basophils Relative: 1 %
Eosinophils Absolute: 0.1 10*3/uL (ref 0.0–0.5)
Eosinophils Relative: 1 %
HCT: 35.3 % — ABNORMAL LOW (ref 36.0–46.0)
Hemoglobin: 11.5 g/dL — ABNORMAL LOW (ref 12.0–15.0)
Immature Granulocytes: 1 %
Lymphocytes Relative: 20 %
Lymphs Abs: 0.9 10*3/uL (ref 0.7–4.0)
MCH: 33 pg (ref 26.0–34.0)
MCHC: 32.6 g/dL (ref 30.0–36.0)
MCV: 101.4 fL — ABNORMAL HIGH (ref 80.0–100.0)
Monocytes Absolute: 0.7 10*3/uL (ref 0.1–1.0)
Monocytes Relative: 15 %
Neutro Abs: 3 10*3/uL (ref 1.7–7.7)
Neutrophils Relative %: 62 %
Platelet Count: 102 10*3/uL — ABNORMAL LOW (ref 150–400)
RBC: 3.48 MIL/uL — ABNORMAL LOW (ref 3.87–5.11)
RDW: 12.3 % (ref 11.5–15.5)
WBC Count: 4.8 10*3/uL (ref 4.0–10.5)
nRBC: 0 % (ref 0.0–0.2)

## 2020-09-18 MED ORDER — PALONOSETRON HCL INJECTION 0.25 MG/5ML
0.2500 mg | Freq: Once | INTRAVENOUS | Status: AC
  Administered 2020-09-18: 0.25 mg via INTRAVENOUS

## 2020-09-18 MED ORDER — PALONOSETRON HCL INJECTION 0.25 MG/5ML
INTRAVENOUS | Status: AC
  Filled 2020-09-18: qty 5

## 2020-09-18 MED ORDER — SODIUM CHLORIDE 0.9 % IV SOLN
Freq: Once | INTRAVENOUS | Status: AC
  Filled 2020-09-18: qty 250

## 2020-09-18 MED ORDER — SODIUM CHLORIDE 0.9 % IV SOLN
10.0000 mg | Freq: Once | INTRAVENOUS | Status: AC
  Administered 2020-09-18: 10 mg via INTRAVENOUS
  Filled 2020-09-18: qty 10

## 2020-09-18 MED ORDER — SODIUM CHLORIDE 0.9 % IV SOLN
235.2000 mg | Freq: Once | INTRAVENOUS | Status: AC
  Administered 2020-09-18: 240 mg via INTRAVENOUS
  Filled 2020-09-18: qty 24

## 2020-09-18 MED ORDER — SODIUM CHLORIDE 0.9 % IV SOLN
150.0000 mg | Freq: Once | INTRAVENOUS | Status: AC
  Administered 2020-09-18: 150 mg via INTRAVENOUS
  Filled 2020-09-18: qty 150

## 2020-09-18 NOTE — Patient Instructions (Signed)
[facility] Discharge Instructions for Patients Receiving Chemotherapy  Today you received the following chemotherapy agents Carboplatin  To help prevent nausea and vomiting after your treatment, we encourage you to take your nausea medication as directed   If you develop nausea and vomiting that is not controlled by your nausea medication, call the clinic.   BELOW ARE SYMPTOMS THAT SHOULD BE REPORTED IMMEDIATELY:  *FEVER GREATER THAN 100.5 F  *CHILLS WITH OR WITHOUT FEVER  NAUSEA AND VOMITING THAT IS NOT CONTROLLED WITH YOUR NAUSEA MEDICATION  *UNUSUAL SHORTNESS OF BREATH  *UNUSUAL BRUISING OR BLEEDING  TENDERNESS IN MOUTH AND THROAT WITH OR WITHOUT PRESENCE OF ULCERS  *URINARY PROBLEMS  *BOWEL PROBLEMS  UNUSUAL RASH Items with * indicate a potential emergency and should be followed up as soon as possible.  Feel free to call the clinic should you have any questions or concerns. The clinic phone number is [phone].  Please show the CHEMO ALERT CARD at check-in to the Emergency Department and triage nurse.   

## 2020-09-18 NOTE — Telephone Encounter (Signed)
Scheduled appts per 1/5 sch msg. Gave pt a print out of appt calendar

## 2020-09-19 ENCOUNTER — Encounter: Payer: Self-pay | Admitting: Hematology and Oncology

## 2020-09-19 NOTE — Progress Notes (Signed)
[place] OFFICE PROGRESS NOTE  Patient Care Team: Patient, [name] as PCP - General (General Practice)  ASSESSMENT & PLAN:  Malignant neoplasm of cervix ([place]) Overall, she tolerated treatment poorly complicated by severe pancytopenia requiring multiple dose adjustment and delays on chemotherapy We will proceed with treatment today with previous dose adjustment Plan to lengthen the interval of future treatment Plan to repeat imaging studies before I see her and she is in agreement  Pancytopenia, acquired ([place]) This is due to cumulative side effects of treatment causing bone marrow suppression in addition to her recent weight loss She is not symptomatic Observe closely for now We will continue with similar dose adjustment as before and I plan to lengthen the interval between treatment  [facility]) She has recent mild weight loss that likely contributed to her severe pancytopenia We discussed the importance of frequent small meals   Orders Placed This Encounter  Procedures  . CT ABDOMEN PELVIS W CONTRAST    Standing Status:   Future    Standing Expiration Date:   09/18/2021    Order Specific Question:   If indicated for the ordered procedure, I authorize the administration of contrast media per Radiology protocol    Answer:   Yes    Order Specific Question:   Preferred imaging location?    Answer:   [facility]    Order Specific Question:   Radiology Contrast Protocol - do NOT remove file path    Answer:   [email or web address]    All questions were answered. The patient knows to call the clinic with any problems, questions or concerns. The total time spent in the appointment was 20 minutes encounter with patients including review of chart and various tests results, discussions about plan of care and coordination of care plan   [name], MD 09/19/2020 8:55 AM  INTERVAL HISTORY: Please see below for problem  oriented charting. She returns for chemotherapy and follow-up She has no recent weight loss No recent nausea or constipation The patient denies any recent signs or symptoms of bleeding such as spontaneous epistaxis, hematuria or hematochezia. Denies recent infection, fever or chills  SUMMARY OF ONCOLOGIC HISTORY: Oncology History Overview Note  Cancer Staging Malignant neoplasm of cervix ([place]) Staging form: Cervix Uteri, AJCC 8th Edition - Clinical: Stage IIIB (cT3b, cN1, cM0) - Signed by [name], MD on 05/04/2018  PD-L1 1%   Malignant neoplasm of cervix ([place])  03/13/2018 Imaging   US pelvis There are mobile echogenic foci within the endometrial cavity suggestive of gas. This is nonspecific in etiology however may be secondary to endometritis. Correlate for history of recent instrumentation. This obscures visualization of the endometrial tissue and therefore a follow-up pelvic ultrasound in 2-4 weeks is recommended after resolution of the acute symptomatology if symptoms persist to further evaluate the endometrium.    03/13/2018 Initial Diagnosis   She initially presented with PMB   04/12/2018 Pathology Results   Cervix, biopsy, mass - INVASIVE SQUAMOUS CELL CARCINOMA - SEE COMMENT   04/12/2018 Surgery   PREOPERATIVE DIAGNOSIS:  Postmenopausal vaginal bleeding. POSTOPERATIVE DIAGNOSIS: The same PROCEDURE: Exam under anesthesia, pap smear, cervical mass biopsy SURGEON:  Dr. [name]  INDICATIONS: 70 y.o. yo G0P0000 with PMB here for exam under anesthesia.Risks of surgery were discussed with the patient including but not limited to: bleeding which may require transfusion; infection which may require antibiotics; injury to uterus or surrounding organs; need for additional procedures including laparotomy or laparoscopy; and other  postoperative/anesthesia complications. Written informed consent was obtained.    FINDINGS:  An 8-week size midline uterus.  No adnexal mass  palpable on exam. Normal cervix not visualized. Friable mass seen in involving the vagina at the level of the cervix obliterating the anterior and posterior fornix.  ANESTHESIA:   General INTRAVENOUS FLUIDS:  300 ml of LR ESTIMATED BLOOD LOSS: 20 ml. SPECIMENS: pap smear, cervical mass biopsy COMPLICATIONS:  None immediate.    04/25/2018 PET scan   1. Large cervical mass may invade into the myometrium in down into the vagina, maximum SUV 21.3. There is a malignant left external iliac node measuring 1.4 cm in short axis with maximum SUV 14.1. 2. Accentuated activity in the cecum and ascending colon is likely physiologic given that it has no CT correlate. Correlation with the patient's colon cancer screening history is recommended. If screening is not up-to-date, appropriate screening should be considered. 3.  Aortic Atherosclerosis (ICD10-I70.0).    04/28/2018 Surgery   Pre-operative Diagnosis:  At least Stage 2B SCCa Cervical Cancer  Post-operative Diagnosis: At least clinical Stage 3A SCCa Cervical Cancer  Operation:  Exam under anesthesia due to intolerance for vaginal exam in office Cystoscopy due to concern for extension to posterior bladder (from anterior vaginal wall lesion)  Operative Findings:   1. Parametrial involvement on right  2. Right sided subvaginal tumor burden ~3cm. This approximates the right ureteral insertion into the bladder based on my exam during cystoscopy. 3. Extension of disease down upper half of vagina on lateral and posterior walls. 4. Extension of disease down to lower third of anterior vagina (versus skip lesion) thus making her Stage 3A 5. Cystoscopy revealed patent bilateral ureteral orifices and no bladder mucosa invasion or areas of concern. 6. Rectal exam grossly no disease.    05/04/2018 Cancer Staging   Staging form: Cervix Uteri, AJCC 8th Edition - Clinical: Stage IIIB (cT3b, cN1, cM0) - Signed by [name], MD on 05/04/2018    05/12/2018 Procedure   Successful 8 French right internal jugular vein power port placement with its tip at the SVC/RA junction   05/13/2018 - 06/24/2018 Chemotherapy   The patient had weekly cisplatin given with concurrent radiation    05/17/2018 - 07/27/2018 Radiation Therapy   Radiation treatment dates:05/17/2018-07/27/2018  Site/dose:1. Cervix, 1.8 Gy in 25 fractions for a total dose of 45 Gy 2. Pelvis  Boost, 1.8 Gy in 5 fractions for a total dose of 9 Gy 3. Cervix,5.5Gy in 5 fractions for a total dose of 27.5Gy   06/03/2018 Adverse Reaction   She missed her chemo due to severe depression and was sent to the ER for suicidal ideation.  She was seen by psychiatrist.   10/27/2018 PET scan   IMPRESSION: 1. Complete metabolic response to therapy, without residual or recurrent hypermetabolic disease. 2.  Aortic Atherosclerosis (ICD10-I70.0).    11/17/2018 Imaging   Successful right IJ vein Port-A-Cath explant.   03/29/2020 PET scan   1. Hypermetabolic lesion along the right vaginal cuff is highly worrisome for recurrent cervical cancer. No evidence of distant metastatic disease. 2.  Aortic atherosclerosis (ICD10-I70.0).   04/26/2020 -  Chemotherapy   The patient had CARBOplatin  for chemotherapy treatment.      Genetic Testing   Patient has genetic testing done for PD-L1. Results revealed patient has the following:  PD-L1 combined positive score (CPS): 1%   07/08/2020 Imaging   1. Unchanged post treatment appearance of the pelvis, including a soft tissue nodule in the low  right hemipelvis adjacent to the vagina measuring 2.8 x 1.7 cm, previously FDG avid and consistent with malignancy. 2. No evidence of nodal or distant metastatic disease in the abdomen or pelvis. 3. Aortic Atherosclerosis (ICD10-I70.0).     REVIEW OF SYSTEMS:   Constitutional: Denies fevers, chills or abnormal weight loss Eyes: Denies blurriness of vision Ears,  nose, mouth, throat, and face: Denies mucositis or sore throat Respiratory: Denies cough, dyspnea or wheezes Cardiovascular: Denies palpitation, chest discomfort or lower extremity swelling Gastrointestinal:  Denies nausea, heartburn or change in bowel habits Skin: Denies abnormal skin rashes Lymphatics: Denies new lymphadenopathy or easy bruising Neurological:Denies numbness, tingling or new weaknesses Behavioral/Psych: Mood is stable, no new changes  All other systems were reviewed with the patient and are negative.  I have reviewed the past medical history, past surgical history, social history and family history with the patient and they are unchanged from previous note.  ALLERGIES:  has No Known Allergies.  MEDICATIONS:  Current Outpatient Medications  Medication Sig Dispense Refill  . Multiple Vitamin (MULTIVITAMIN) tablet Take 1 tablet by mouth daily.    . ondansetron (ZOFRAN) 8 MG tablet Take 1 tablet (8 mg total) by mouth every 8 (eight) hours as needed for refractory nausea / vomiting. Start on day 3 after carboplatin chemo. 30 tablet 1  . prochlorperazine (COMPAZINE) 10 MG tablet Take 1 tablet (10 mg total) by mouth every 6 (six) hours as needed (Nausea or vomiting). 30 tablet 1   No current facility-administered medications for this visit.    PHYSICAL EXAMINATION: ECOG PERFORMANCE STATUS: 1 - Symptomatic but completely ambulatory  Vitals:   09/18/20 1237  BP: 129/77  Pulse: 93  Resp: 18  Temp: 98.1 F (36.7 C)  SpO2: 98%   Filed Weights   09/18/20 1237  Weight: 87 lb 11.2 oz (39.8 kg)    GENERAL:alert, no distress and comfortable SKIN: skin color, texture, turgor are normal, no rashes or significant lesions EYES: normal, Conjunctiva are pink and non-injected, sclera clear OROPHARYNX:no exudate, no erythema and lips, buccal mucosa, and tongue normal  NECK: supple, thyroid normal size, non-tender, without nodularity LYMPH:  no palpable lymphadenopathy in the  cervical, axillary or inguinal LUNGS: clear to auscultation and percussion with normal breathing effort HEART: regular rate & rhythm and no murmurs and no lower extremity edema ABDOMEN:abdomen soft, non-tender and normal bowel sounds Musculoskeletal:no cyanosis of digits and no clubbing  NEURO: alert & oriented x 3 with fluent speech, no focal motor/sensory deficits  LABORATORY DATA:  I have reviewed the data as listed    Component Value Date/Time   NA 142 09/18/2020 1200   K 4.0 09/18/2020 1200   CL 107 09/18/2020 1200   CO2 24 09/18/2020 1200   GLUCOSE 88 09/18/2020 1200   BUN 14 09/18/2020 1200   CREATININE 0.83 09/18/2020 1200   CREATININE 0.83 03/11/2020 0951   CALCIUM 9.1 09/18/2020 1200   PROT 6.9 09/18/2020 1200   ALBUMIN 3.9 09/18/2020 1200   AST 15 09/18/2020 1200   AST 14 (L) 12/11/2019 1141   ALT 9 09/18/2020 1200   ALT 7 12/11/2019 1141   ALKPHOS 68 09/18/2020 1200   BILITOT 0.4 09/18/2020 1200   BILITOT 0.4 12/11/2019 1141   GFRNONAA >60 09/18/2020 1200   GFRNONAA >60 03/11/2020 0951   GFRAA >60 06/14/2020 0844   GFRAA >60 03/11/2020 0951    No results found for: SPEP, UPEP  Lab Results  Component Value Date   WBC 4.8 09/18/2020  NEUTROABS 3.0 09/18/2020   HGB 11.5 (L) 09/18/2020   HCT 35.3 (L) 09/18/2020   MCV 101.4 (H) 09/18/2020   PLT 102 (L) 09/18/2020      Chemistry      Component Value Date/Time   NA 142 09/18/2020 1200   K 4.0 09/18/2020 1200   CL 107 09/18/2020 1200   CO2 24 09/18/2020 1200   BUN 14 09/18/2020 1200   CREATININE 0.83 09/18/2020 1200   CREATININE 0.83 03/11/2020 0951      Component Value Date/Time   CALCIUM 9.1 09/18/2020 1200   ALKPHOS 68 09/18/2020 1200   AST 15 09/18/2020 1200   AST 14 (L) 12/11/2019 1141   ALT 9 09/18/2020 1200   ALT 7 12/11/2019 1141   BILITOT 0.4 09/18/2020 1200   BILITOT 0.4 12/11/2019 1141

## 2020-09-19 NOTE — Assessment & Plan Note (Signed)
She has recent mild weight loss that likely contributed to her severe pancytopenia We discussed the importance of frequent small meals

## 2020-09-19 NOTE — Assessment & Plan Note (Signed)
This is due to cumulative side effects of treatment causing bone marrow suppression in addition to her recent weight loss She is not symptomatic Observe closely for now We will continue with similar dose adjustment as before and I plan to lengthen the interval between treatment

## 2020-09-19 NOTE — Assessment & Plan Note (Signed)
Overall, she tolerated treatment poorly complicated by severe pancytopenia requiring multiple dose adjustment and delays on chemotherapy We will proceed with treatment today with previous dose adjustment Plan to lengthen the interval of future treatment Plan to repeat imaging studies before I see her and she is in agreement

## 2020-10-14 ENCOUNTER — Other Ambulatory Visit: Payer: Self-pay

## 2020-10-14 ENCOUNTER — Inpatient Hospital Stay: Payer: Medicare Other

## 2020-10-14 ENCOUNTER — Ambulatory Visit (HOSPITAL_COMMUNITY)
Admission: RE | Admit: 2020-10-14 | Discharge: 2020-10-14 | Disposition: A | Discharge status: Home / Self Care | Payer: Medicare Other | Source: Ambulatory Visit | Attending: Hematology and Oncology | Admitting: Hematology and Oncology

## 2020-10-14 DIAGNOSIS — C539 Malignant neoplasm of cervix uteri, unspecified: Secondary | ICD-10-CM

## 2020-10-14 DIAGNOSIS — Z7189 Other specified counseling: Secondary | ICD-10-CM

## 2020-10-14 DIAGNOSIS — Z5111 Encounter for antineoplastic chemotherapy: Secondary | ICD-10-CM | POA: Diagnosis not present

## 2020-10-14 LAB — CBC WITH DIFFERENTIAL (CANCER CENTER ONLY)
Abs Immature Granulocytes: 0.02 10*3/uL (ref 0.00–0.07)
Basophils Absolute: 0 10*3/uL (ref 0.0–0.1)
Basophils Relative: 1 %
Eosinophils Absolute: 0 10*3/uL (ref 0.0–0.5)
Eosinophils Relative: 1 %
HCT: 32.4 % — ABNORMAL LOW (ref 36.0–46.0)
Hemoglobin: 10.6 g/dL — ABNORMAL LOW (ref 12.0–15.0)
Immature Granulocytes: 1 %
Lymphocytes Relative: 24 %
Lymphs Abs: 0.8 10*3/uL (ref 0.7–4.0)
MCH: 33.1 pg (ref 26.0–34.0)
MCHC: 32.7 g/dL (ref 30.0–36.0)
MCV: 101.3 fL — ABNORMAL HIGH (ref 80.0–100.0)
Monocytes Absolute: 0.5 10*3/uL (ref 0.1–1.0)
Monocytes Relative: 15 %
Neutro Abs: 1.9 10*3/uL (ref 1.7–7.7)
Neutrophils Relative %: 58 %
Platelet Count: 95 10*3/uL — ABNORMAL LOW (ref 150–400)
RBC: 3.2 MIL/uL — ABNORMAL LOW (ref 3.87–5.11)
RDW: 12.4 % (ref 11.5–15.5)
WBC Count: 3.2 10*3/uL — ABNORMAL LOW (ref 4.0–10.5)
nRBC: 0 % (ref 0.0–0.2)

## 2020-10-14 LAB — COMPREHENSIVE METABOLIC PANEL
ALT: 7 U/L (ref 0–44)
AST: 13 U/L — ABNORMAL LOW (ref 15–41)
Albumin: 3.7 g/dL (ref 3.5–5.0)
Alkaline Phosphatase: 64 U/L (ref 38–126)
Anion gap: 6 (ref 5–15)
BUN: 12 mg/dL (ref 8–23)
CO2: 27 mmol/L (ref 22–32)
Calcium: 8.8 mg/dL — ABNORMAL LOW (ref 8.9–10.3)
Chloride: 106 mmol/L (ref 98–111)
Creatinine, Ser: 0.77 mg/dL (ref 0.44–1.00)
GFR, Estimated: >60 mL/min (ref >60)
Glucose, Bld: 87 mg/dL (ref 70–99)
Potassium: 3.9 mmol/L (ref 3.5–5.1)
Sodium: 139 mmol/L (ref 135–145)
Total Bilirubin: 0.3 mg/dL (ref 0.3–1.2)
Total Protein: 6.6 g/dL (ref 6.5–8.1)

## 2020-10-14 MED ORDER — IOHEXOL 300 MG/ML  SOLN
75.0000 mL | Freq: Once | INTRAMUSCULAR | Status: AC | PRN
  Administered 2020-10-14: 75 mL via INTRAVENOUS

## 2020-10-15 ENCOUNTER — Inpatient Hospital Stay: Payer: Medicare Other | Attending: Hematology and Oncology | Admitting: Hematology and Oncology

## 2020-10-15 ENCOUNTER — Inpatient Hospital Stay: Payer: Medicare Other

## 2020-10-15 ENCOUNTER — Telehealth: Payer: Self-pay | Admitting: Hematology and Oncology

## 2020-10-15 ENCOUNTER — Encounter: Payer: Self-pay | Admitting: Hematology and Oncology

## 2020-10-15 ENCOUNTER — Other Ambulatory Visit: Payer: Self-pay

## 2020-10-15 DIAGNOSIS — C539 Malignant neoplasm of cervix uteri, unspecified: Secondary | ICD-10-CM | POA: Insufficient documentation

## 2020-10-15 DIAGNOSIS — R64 Cachexia: Secondary | ICD-10-CM | POA: Insufficient documentation

## 2020-10-15 DIAGNOSIS — Z7189 Other specified counseling: Secondary | ICD-10-CM

## 2020-10-15 DIAGNOSIS — I700 Atherosclerosis of aorta: Secondary | ICD-10-CM | POA: Insufficient documentation

## 2020-10-15 DIAGNOSIS — D61818 Other pancytopenia: Secondary | ICD-10-CM | POA: Insufficient documentation

## 2020-10-15 MED ORDER — PALONOSETRON HCL INJECTION 0.25 MG/5ML
0.2500 mg | Freq: Once | INTRAVENOUS | Status: AC
  Administered 2020-10-15: 0.25 mg via INTRAVENOUS

## 2020-10-15 MED ORDER — FAMOTIDINE IN NACL 20-0.9 MG/50ML-% IV SOLN
INTRAVENOUS | Status: AC
  Filled 2020-10-15: qty 50

## 2020-10-15 MED ORDER — SODIUM CHLORIDE 0.9 % IV SOLN
239.2000 mg | Freq: Once | INTRAVENOUS | Status: AC
  Administered 2020-10-15: 240 mg via INTRAVENOUS
  Filled 2020-10-15: qty 24

## 2020-10-15 MED ORDER — FAMOTIDINE IN NACL 20-0.9 MG/50ML-% IV SOLN
20.0000 mg | Freq: Once | INTRAVENOUS | Status: AC
  Administered 2020-10-15: 20 mg via INTRAVENOUS

## 2020-10-15 MED ORDER — DIPHENHYDRAMINE HCL 50 MG/ML IJ SOLN
25.0000 mg | Freq: Once | INTRAMUSCULAR | Status: AC
  Administered 2020-10-15: 25 mg via INTRAVENOUS

## 2020-10-15 MED ORDER — SODIUM CHLORIDE 0.9 % IV SOLN
150.0000 mg | Freq: Once | INTRAVENOUS | Status: AC
  Administered 2020-10-15: 150 mg via INTRAVENOUS
  Filled 2020-10-15: qty 150

## 2020-10-15 MED ORDER — PALONOSETRON HCL INJECTION 0.25 MG/5ML
INTRAVENOUS | Status: AC
  Filled 2020-10-15: qty 5

## 2020-10-15 MED ORDER — SODIUM CHLORIDE 0.9 % IV SOLN
Freq: Once | INTRAVENOUS | Status: AC
  Filled 2020-10-15: qty 250

## 2020-10-15 MED ORDER — DIPHENHYDRAMINE HCL 50 MG/ML IJ SOLN
INTRAMUSCULAR | Status: AC
  Filled 2020-10-15: qty 1

## 2020-10-15 MED ORDER — SODIUM CHLORIDE 0.9 % IV SOLN
10.0000 mg | Freq: Once | INTRAVENOUS | Status: AC
  Administered 2020-10-15: 10 mg via INTRAVENOUS
  Filled 2020-10-15: qty 10

## 2020-10-15 NOTE — Assessment & Plan Note (Signed)
I have reviewed multiple imaging studies with the patient Overall, she has achieved maximum response to therapy She tolerated treatment fairly well except for pancytopenia of which she is not symptomatic We discussed the risk and benefits of observation, continuing 3 more cycles of treatment versus switching her to something else The patient is in favor of continuing 3 more cycles of treatment I plan to repeat imaging study again in May

## 2020-10-15 NOTE — Patient Instructions (Signed)
[facility] Discharge Instructions for Patients Receiving Chemotherapy  Today you received the following chemotherapy agents Carboplatin  To help prevent nausea and vomiting after your treatment, we encourage you to take your nausea medication as directed   If you develop nausea and vomiting that is not controlled by your nausea medication, call the clinic.   BELOW ARE SYMPTOMS THAT SHOULD BE REPORTED IMMEDIATELY:  *FEVER GREATER THAN 100.5 F  *CHILLS WITH OR WITHOUT FEVER  NAUSEA AND VOMITING THAT IS NOT CONTROLLED WITH YOUR NAUSEA MEDICATION  *UNUSUAL SHORTNESS OF BREATH  *UNUSUAL BRUISING OR BLEEDING  TENDERNESS IN MOUTH AND THROAT WITH OR WITHOUT PRESENCE OF ULCERS  *URINARY PROBLEMS  *BOWEL PROBLEMS  UNUSUAL RASH Items with * indicate a potential emergency and should be followed up as soon as possible.  Feel free to call the clinic should you have any questions or concerns. The clinic phone number is [phone].  Please show the CHEMO ALERT CARD at check-in to the Emergency Department and triage nurse.   

## 2020-10-15 NOTE — Telephone Encounter (Signed)
Scheduled appointments per 2/1 los. Spoke to patient who is aware of appointments dates and times. Gave patient calendar print out.

## 2020-10-16 NOTE — Assessment & Plan Note (Signed)
This is due to cumulative side effects of treatment causing bone marrow suppression in addition to her recent weight loss She is not symptomatic Observe closely for now We will continue with similar dose adjustment as before and I plan to continue interval between treatment every 4 weeks instead of every 3 weeks

## 2020-10-16 NOTE — Assessment & Plan Note (Signed)
She has recent mild intermittent weight loss that likely contributed to her severe pancytopenia We discussed the importance of frequent small meals 

## 2020-10-16 NOTE — Progress Notes (Signed)
[facility] OFFICE PROGRESS NOTE  Patient Care Team: Patient, [name] as PCP - General (General Practice)  ASSESSMENT & PLAN:  Malignant neoplasm of cervix (HCC) I have reviewed multiple imaging studies with the patient Overall, she has achieved maximum response to therapy She tolerated treatment fairly well except for pancytopenia of which she is not symptomatic We discussed the risk and benefits of observation, continuing 3 more cycles of treatment versus switching her to something else The patient is in favor of continuing 3 more cycles of treatment I plan to repeat imaging study again in May  Pancytopenia, acquired [facility]) This is due to cumulative side effects of treatment causing bone marrow suppression in addition to her recent weight loss She is not symptomatic Observe closely for now We will continue with similar dose adjustment as before and I plan to continue interval between treatment every 4 weeks instead of every 3 weeks  Cachexia (HCC) She has recent mild intermittent weight loss that likely contributed to her severe pancytopenia We discussed the importance of frequent small meals   No orders of the defined types were placed in this encounter.   All questions were answered. The patient knows to call the clinic with any problems, questions or concerns. The total time spent in the appointment was 30 minutes encounter with patients including review of chart and various tests results, discussions about plan of care and coordination of care plan   Artis Delay, MD 10/16/2020 8:46 AM  INTERVAL HISTORY: Please see below for problem oriented charting. She returns for treatment and follow-up She tolerated last cycle of treatment well No nausea or constipation Denies recent infection, fever or chills No recent bleeding  SUMMARY OF ONCOLOGIC HISTORY: Oncology History Overview Note  Cancer Staging Malignant neoplasm of cervix (HCC) Staging form: Cervix  Uteri, AJCC 8th Edition - Clinical: Stage IIIB (cT3b, cN1, cM0) - Signed by Artis Delay, MD on 05/04/2018  PD-L1 1%   Malignant neoplasm of cervix (HCC)  03/13/2018 Imaging   US pelvis There are mobile echogenic foci within the endometrial cavity suggestive of gas. This is nonspecific in etiology however may be secondary to endometritis. Correlate for history of recent instrumentation. This obscures visualization of the endometrial tissue and therefore a follow-up pelvic ultrasound in 2-4 weeks is recommended after resolution of the acute symptomatology if symptoms persist to further evaluate the endometrium.    03/13/2018 Initial Diagnosis   She initially presented with PMB   04/12/2018 Pathology Results   Cervix, biopsy, mass - INVASIVE SQUAMOUS CELL CARCINOMA - SEE COMMENT   04/12/2018 Surgery   PREOPERATIVE DIAGNOSIS:  Postmenopausal vaginal bleeding. POSTOPERATIVE DIAGNOSIS: The same PROCEDURE: Exam under anesthesia, pap smear, cervical mass biopsy SURGEON:  Dr. Catalina Antigua  INDICATIONS: 70 y.o. yo [id number] with PMB here for exam under anesthesia.Risks of surgery were discussed with the patient including but not limited to: bleeding which may require transfusion; infection which may require antibiotics; injury to uterus or surrounding organs; need for additional procedures including laparotomy or laparoscopy; and other postoperative/anesthesia complications. Written informed consent was obtained.    FINDINGS:  An 8-week size midline uterus.  No adnexal mass palpable on exam. Normal cervix not visualized. Friable mass seen in involving the vagina at the level of the cervix obliterating the anterior and posterior fornix.  ANESTHESIA:   General INTRAVENOUS FLUIDS:  300 ml of LR ESTIMATED BLOOD LOSS: 20 ml. SPECIMENS: pap smear, cervical mass biopsy COMPLICATIONS:  None immediate.    04/25/2018  PET scan   1. Large cervical mass may invade into the myometrium in down into the  vagina, maximum SUV 21.3. There is a malignant left external iliac node measuring 1.4 cm in short axis with maximum SUV 14.1. 2. Accentuated activity in the cecum and ascending colon is likely physiologic given that it has no CT correlate. Correlation with the patient's colon cancer screening history is recommended. If screening is not up-to-date, appropriate screening should be considered. 3.  Aortic Atherosclerosis (ICD10-I70.0).    04/28/2018 Surgery   Pre-operative Diagnosis:  At least Stage 2B SCCa Cervical Cancer  Post-operative Diagnosis: At least clinical Stage 3A SCCa Cervical Cancer  Operation:  Exam under anesthesia due to intolerance for vaginal exam in office Cystoscopy due to concern for extension to posterior bladder (from anterior vaginal wall lesion)  Operative Findings:   1. Parametrial involvement on right  2. Right sided subvaginal tumor burden ~3cm. This approximates the right ureteral insertion into the bladder based on my exam during cystoscopy. 3. Extension of disease down upper half of vagina on lateral and posterior walls. 4. Extension of disease down to lower third of anterior vagina (versus skip lesion) thus making her Stage 3A 5. Cystoscopy revealed patent bilateral ureteral orifices and no bladder mucosa invasion or areas of concern. 6. Rectal exam grossly no disease.    05/04/2018 Cancer Staging   Staging form: Cervix Uteri, AJCC 8th Edition - Clinical: Stage IIIB (cT3b, cN1, cM0) - Signed by [name], MD on 05/04/2018   05/12/2018 Procedure   Successful 8 French right internal jugular vein power port placement with its tip at the SVC/RA junction   05/13/2018 - 06/24/2018 Chemotherapy   The patient had weekly cisplatin given with concurrent radiation    05/17/2018 - 07/27/2018 Radiation Therapy   Radiation treatment dates:05/17/2018-07/27/2018  Site/dose:1. Cervix, 1.8 Gy in 25 fractions for a total dose of 45 Gy 2. Pelvis   Boost, 1.8 Gy in 5 fractions for a total dose of 9 Gy 3. Cervix,5.5Gy in 5 fractions for a total dose of 27.5Gy   06/03/2018 Adverse Reaction   She missed her chemo due to severe depression and was sent to the ER for suicidal ideation.  She was seen by psychiatrist.   10/27/2018 PET scan   IMPRESSION: 1. Complete metabolic response to therapy, without residual or recurrent hypermetabolic disease. 2.  Aortic Atherosclerosis (ICD10-I70.0).    11/17/2018 Imaging   Successful right IJ vein Port-A-Cath explant.   03/29/2020 PET scan   1. Hypermetabolic lesion along the right vaginal cuff is highly worrisome for recurrent cervical cancer. No evidence of distant metastatic disease. 2.  Aortic atherosclerosis (ICD10-I70.0).   04/26/2020 -  Chemotherapy   The patient had CARBOplatin  for chemotherapy treatment.      Genetic Testing   Patient has genetic testing done for PD-L1. Results revealed patient has the following:  PD-L1 combined positive score (CPS): 1%   07/08/2020 Imaging   1. Unchanged post treatment appearance of the pelvis, including a soft tissue nodule in the low right hemipelvis adjacent to the vagina measuring 2.8 x 1.7 cm, previously FDG avid and consistent with malignancy. 2. No evidence of nodal or distant metastatic disease in the abdomen or pelvis. 3. Aortic Atherosclerosis (ICD10-I70.0).   10/14/2020 Imaging   1. Unchanged right eccentric soft tissue mass in the low right hemipelvis centered on the cervix and vagina, measuring 2.9 x 1.9 cm. 2. New small volume fluid within the endometrial cavity, abnormal in the postmenopausal  setting and suggesting obstruction of the cervical os by soft tissue mass. 3. No evidence of lymphadenopathy or distant metastatic disease in the abdomen or pelvis.   Aortic Atherosclerosis (ICD10-I70.0).     REVIEW OF SYSTEMS:   Constitutional: Denies fevers, chills or abnormal weight loss Eyes: Denies blurriness of  vision Ears, nose, mouth, throat, and face: Denies mucositis or sore throat Respiratory: Denies cough, dyspnea or wheezes Cardiovascular: Denies palpitation, chest discomfort or lower extremity swelling Gastrointestinal:  Denies nausea, heartburn or change in bowel habits Skin: Denies abnormal skin rashes Lymphatics: Denies new lymphadenopathy or easy bruising Neurological:Denies numbness, tingling or new weaknesses Behavioral/Psych: Mood is stable, no new changes  All other systems were reviewed with the patient and are negative.  I have reviewed the past medical history, past surgical history, social history and family history with the patient and they are unchanged from previous note.  ALLERGIES:  has No Known Allergies.  MEDICATIONS:  Current Outpatient Medications  Medication Sig Dispense Refill  . Multiple Vitamin (MULTIVITAMIN) tablet Take 1 tablet by mouth daily.    . ondansetron (ZOFRAN) 8 MG tablet Take 1 tablet (8 mg total) by mouth every 8 (eight) hours as needed for refractory nausea / vomiting. Start on day 3 after carboplatin chemo. 30 tablet 1  . prochlorperazine (COMPAZINE) 10 MG tablet Take 1 tablet (10 mg total) by mouth every 6 (six) hours as needed (Nausea or vomiting). 30 tablet 1   No current facility-administered medications for this visit.    PHYSICAL EXAMINATION: ECOG PERFORMANCE STATUS: 1 - Symptomatic but completely ambulatory  Vitals:   10/15/20 1228  BP: (!) 128/52  Pulse: 81  Resp: 18  Temp: 98.2 F (36.8 C)  SpO2: 98%   Filed Weights   10/15/20 1228  Weight: 90 lb 3.2 oz (40.9 kg)    GENERAL:alert, no distress and comfortable NEURO: alert & oriented x 3 with fluent speech, no focal motor/sensory deficits  LABORATORY DATA:  I have reviewed the data as listed    Component Value Date/Time   NA 139 10/14/2020 1108   K 3.9 10/14/2020 1108   CL 106 10/14/2020 1108   CO2 27 10/14/2020 1108   GLUCOSE 87 10/14/2020 1108   BUN 12 10/14/2020  1108   CREATININE 0.77 10/14/2020 1108   CREATININE 0.83 03/11/2020 0951   CALCIUM 8.8 (L) 10/14/2020 1108   PROT 6.6 10/14/2020 1108   ALBUMIN 3.7 10/14/2020 1108   AST 13 (L) 10/14/2020 1108   AST 14 (L) 12/11/2019 1141   ALT 7 10/14/2020 1108   ALT 7 12/11/2019 1141   ALKPHOS 64 10/14/2020 1108   BILITOT 0.3 10/14/2020 1108   BILITOT 0.4 12/11/2019 1141   GFRNONAA >60 10/14/2020 1108   GFRNONAA >60 03/11/2020 0951   GFRAA >60 06/14/2020 0844   GFRAA >60 03/11/2020 0951    No results found for: SPEP, UPEP  Lab Results  Component Value Date   WBC 3.2 (L) 10/14/2020   NEUTROABS 1.9 10/14/2020   HGB 10.6 (L) 10/14/2020   HCT 32.4 (L) 10/14/2020   MCV 101.3 (H) 10/14/2020   PLT 95 (L) 10/14/2020      Chemistry      Component Value Date/Time   NA 139 10/14/2020 1108   K 3.9 10/14/2020 1108   CL 106 10/14/2020 1108   CO2 27 10/14/2020 1108   BUN 12 10/14/2020 1108   CREATININE 0.77 10/14/2020 1108   CREATININE 0.83 03/11/2020 0951      Component Value  Date/Time   CALCIUM 8.8 (L) 10/14/2020 1108   ALKPHOS 64 10/14/2020 1108   AST 13 (L) 10/14/2020 1108   AST 14 (L) 12/11/2019 1141   ALT 7 10/14/2020 1108   ALT 7 12/11/2019 1141   BILITOT 0.3 10/14/2020 1108   BILITOT 0.4 12/11/2019 1141       RADIOGRAPHIC STUDIES: I have reviewed multiple CT imaging with the patient I have personally reviewed the radiological images as listed and agreed with the findings in the report. CT ABDOMEN PELVIS W CONTRAST  Result Date: 10/14/2020 CLINICAL DATA:  Cervical cancer, assess treatment response EXAM: CT ABDOMEN AND PELVIS WITH CONTRAST TECHNIQUE: Multidetector CT imaging of the abdomen and pelvis was performed using the standard protocol following bolus administration of intravenous contrast. CONTRAST:  105mL OMNIPAQUE IOHEXOL 300 MG/ML SOLN, additional oral enteric contrast COMPARISON:  CT abdomen pelvis, 07/08/2020, PET-CT, 03/29/2020, CT abdomen pelvis, 03/14/2020 FINDINGS:  Lower chest: No acute abnormality. Bandlike scarring of the left lung base. Hepatobiliary: No solid liver abnormality is seen. No gallstones, gallbladder wall thickening, or biliary dilatation. Pancreas: Unremarkable. No pancreatic ductal dilatation or surrounding inflammatory changes. Spleen: Normal in size without significant abnormality. Adrenals/Urinary Tract: Adrenal glands are unremarkable. Kidneys are normal, without renal calculi, solid lesion, or hydronephrosis. Bladder is unremarkable. Stomach/Bowel: Stomach is within normal limits. Appendix appears normal. No evidence of bowel wall thickening, distention, or inflammatory changes. Occasional sigmoid diverticula. Vascular/Lymphatic: Scattered aortic atherosclerosis. No enlarged abdominal or pelvic lymph nodes. Reproductive: Unchanged right eccentric soft tissue mass in the low right hemipelvis centered on the cervix and vagina, measuring 2.9 x 1.9 cm (series 2, image 63). There is some adjacent soft tissue stranding in the low pelvis. There is new small volume fluid within the endometrial cavity (series 2, image 61). Other: No abdominal wall hernia or abnormality. No abdominopelvic ascites. Musculoskeletal: No acute or significant osseous findings. Unchanged Schmorl type superior endplate deformity of the L5 vertebral body. IMPRESSION: 1. Unchanged right eccentric soft tissue mass in the low right hemipelvis centered on the cervix and vagina, measuring 2.9 x 1.9 cm. 2. New small volume fluid within the endometrial cavity, abnormal in the postmenopausal setting and suggesting obstruction of the cervical os by soft tissue mass. 3. No evidence of lymphadenopathy or distant metastatic disease in the abdomen or pelvis. Aortic Atherosclerosis (ICD10-I70.0). Electronically Signed   By: Eddie Candle M.D.   On: 10/14/2020 13:54

## 2020-11-12 ENCOUNTER — Inpatient Hospital Stay (HOSPITAL_BASED_OUTPATIENT_CLINIC_OR_DEPARTMENT_OTHER): Payer: Medicare Other | Admitting: Hematology and Oncology

## 2020-11-12 ENCOUNTER — Other Ambulatory Visit: Payer: Self-pay

## 2020-11-12 ENCOUNTER — Other Ambulatory Visit: Payer: Self-pay | Admitting: Hematology and Oncology

## 2020-11-12 ENCOUNTER — Encounter: Payer: Self-pay | Admitting: Hematology and Oncology

## 2020-11-12 ENCOUNTER — Inpatient Hospital Stay: Payer: Medicare Other | Attending: Hematology and Oncology

## 2020-11-12 ENCOUNTER — Inpatient Hospital Stay: Payer: Medicare Other

## 2020-11-12 DIAGNOSIS — I700 Atherosclerosis of aorta: Secondary | ICD-10-CM | POA: Diagnosis not present

## 2020-11-12 DIAGNOSIS — Z923 Personal history of irradiation: Secondary | ICD-10-CM | POA: Diagnosis not present

## 2020-11-12 DIAGNOSIS — K5730 Diverticulosis of large intestine without perforation or abscess without bleeding: Secondary | ICD-10-CM | POA: Insufficient documentation

## 2020-11-12 DIAGNOSIS — D61818 Other pancytopenia: Secondary | ICD-10-CM | POA: Diagnosis not present

## 2020-11-12 DIAGNOSIS — Z7189 Other specified counseling: Secondary | ICD-10-CM

## 2020-11-12 DIAGNOSIS — C539 Malignant neoplasm of cervix uteri, unspecified: Secondary | ICD-10-CM | POA: Diagnosis not present

## 2020-11-12 DIAGNOSIS — N950 Postmenopausal bleeding: Secondary | ICD-10-CM | POA: Insufficient documentation

## 2020-11-12 DIAGNOSIS — R64 Cachexia: Secondary | ICD-10-CM | POA: Diagnosis not present

## 2020-11-12 DIAGNOSIS — Z5111 Encounter for antineoplastic chemotherapy: Secondary | ICD-10-CM | POA: Diagnosis not present

## 2020-11-12 DIAGNOSIS — D539 Nutritional anemia, unspecified: Secondary | ICD-10-CM | POA: Insufficient documentation

## 2020-11-12 DIAGNOSIS — E538 Deficiency of other specified B group vitamins: Secondary | ICD-10-CM | POA: Diagnosis not present

## 2020-11-12 LAB — COMPREHENSIVE METABOLIC PANEL
ALT: 16 U/L (ref 0–44)
AST: 20 U/L (ref 15–41)
Albumin: 3.9 g/dL (ref 3.5–5.0)
Alkaline Phosphatase: 78 U/L (ref 38–126)
Anion gap: 8 (ref 5–15)
BUN: 15 mg/dL (ref 8–23)
CO2: 25 mmol/L (ref 22–32)
Calcium: 8.9 mg/dL (ref 8.9–10.3)
Chloride: 104 mmol/L (ref 98–111)
Creatinine, Ser: 0.87 mg/dL (ref 0.44–1.00)
GFR, Estimated: >60 mL/min (ref >60)
Glucose, Bld: 85 mg/dL (ref 70–99)
Potassium: 4.4 mmol/L (ref 3.5–5.1)
Sodium: 137 mmol/L (ref 135–145)
Total Bilirubin: 0.3 mg/dL (ref 0.3–1.2)
Total Protein: 7.1 g/dL (ref 6.5–8.1)

## 2020-11-12 LAB — CBC WITH DIFFERENTIAL (CANCER CENTER ONLY)
Abs Immature Granulocytes: 0.02 10*3/uL (ref 0.00–0.07)
Basophils Absolute: 0 10*3/uL (ref 0.0–0.1)
Basophils Relative: 0 %
Eosinophils Absolute: 0 10*3/uL (ref 0.0–0.5)
Eosinophils Relative: 0 %
HCT: 33.6 % — ABNORMAL LOW (ref 36.0–46.0)
Hemoglobin: 11.1 g/dL — ABNORMAL LOW (ref 12.0–15.0)
Immature Granulocytes: 1 %
Lymphocytes Relative: 21 %
Lymphs Abs: 0.7 10*3/uL (ref 0.7–4.0)
MCH: 33.4 pg (ref 26.0–34.0)
MCHC: 33 g/dL (ref 30.0–36.0)
MCV: 101.2 fL — ABNORMAL HIGH (ref 80.0–100.0)
Monocytes Absolute: 0.5 10*3/uL (ref 0.1–1.0)
Monocytes Relative: 14 %
Neutro Abs: 2.3 10*3/uL (ref 1.7–7.7)
Neutrophils Relative %: 64 %
Platelet Count: 90 10*3/uL — ABNORMAL LOW (ref 150–400)
RBC: 3.32 MIL/uL — ABNORMAL LOW (ref 3.87–5.11)
RDW: 13.4 % (ref 11.5–15.5)
WBC Count: 3.6 10*3/uL — ABNORMAL LOW (ref 4.0–10.5)
nRBC: 0 % (ref 0.0–0.2)

## 2020-11-12 MED ORDER — FAMOTIDINE IN NACL 20-0.9 MG/50ML-% IV SOLN
20.0000 mg | Freq: Once | INTRAVENOUS | Status: AC
  Administered 2020-11-12: 20 mg via INTRAVENOUS

## 2020-11-12 MED ORDER — SODIUM CHLORIDE 0.9 % IV SOLN
210.3500 mg | Freq: Once | INTRAVENOUS | Status: AC
  Administered 2020-11-12: 210 mg via INTRAVENOUS
  Filled 2020-11-12: qty 21

## 2020-11-12 MED ORDER — PALONOSETRON HCL INJECTION 0.25 MG/5ML
INTRAVENOUS | Status: AC
  Filled 2020-11-12: qty 5

## 2020-11-12 MED ORDER — FAMOTIDINE IN NACL 20-0.9 MG/50ML-% IV SOLN
INTRAVENOUS | Status: AC
  Filled 2020-11-12: qty 50

## 2020-11-12 MED ORDER — PALONOSETRON HCL INJECTION 0.25 MG/5ML
0.2500 mg | Freq: Once | INTRAVENOUS | Status: AC
  Administered 2020-11-12: 0.25 mg via INTRAVENOUS

## 2020-11-12 MED ORDER — DIPHENHYDRAMINE HCL 50 MG/ML IJ SOLN
25.0000 mg | Freq: Once | INTRAMUSCULAR | Status: AC
  Administered 2020-11-12: 25 mg via INTRAVENOUS

## 2020-11-12 MED ORDER — DIPHENHYDRAMINE HCL 50 MG/ML IJ SOLN
INTRAMUSCULAR | Status: AC
  Filled 2020-11-12: qty 1

## 2020-11-12 MED ORDER — SODIUM CHLORIDE 0.9 % IV SOLN
10.0000 mg | Freq: Once | INTRAVENOUS | Status: AC
  Administered 2020-11-12: 10 mg via INTRAVENOUS
  Filled 2020-11-12: qty 10

## 2020-11-12 MED ORDER — SODIUM CHLORIDE 0.9 % IV SOLN
150.0000 mg | Freq: Once | INTRAVENOUS | Status: AC
  Administered 2020-11-12: 150 mg via INTRAVENOUS
  Filled 2020-11-12: qty 150

## 2020-11-12 MED ORDER — SODIUM CHLORIDE 0.9 % IV SOLN
Freq: Once | INTRAVENOUS | Status: AC
  Filled 2020-11-12: qty 250

## 2020-11-12 NOTE — Assessment & Plan Note (Signed)
She has significant persistent pancytopenia despite multiple dose adjustment I plan to reduce AUC from 4 to 3.5 and to use creatinine of 1 for future calculation of chemotherapy dose She will also get treatment only every 4 weeks instead of every 3 weeks to allow bone marrow recovery

## 2020-11-12 NOTE — Progress Notes (Signed)
Per Dr. Alvy Bimler, ok for treatment today with mild pancytopenia.

## 2020-11-12 NOTE — Patient Instructions (Signed)
[facility] Discharge Instructions for Patients Receiving Chemotherapy  Today you received the following chemotherapy agent: Carboplatin  To help prevent nausea and vomiting after your treatment, we encourage you to take your nausea medication as directed by your MD.   If you develop nausea and vomiting that is not controlled by your nausea medication, call the clinic.   BELOW ARE SYMPTOMS THAT SHOULD BE REPORTED IMMEDIATELY:  *FEVER GREATER THAN 100.5 F  *CHILLS WITH OR WITHOUT FEVER  NAUSEA AND VOMITING THAT IS NOT CONTROLLED WITH YOUR NAUSEA MEDICATION  *UNUSUAL SHORTNESS OF BREATH  *UNUSUAL BRUISING OR BLEEDING  TENDERNESS IN MOUTH AND THROAT WITH OR WITHOUT PRESENCE OF ULCERS  *URINARY PROBLEMS  *BOWEL PROBLEMS  UNUSUAL RASH Items with * indicate a potential emergency and should be followed up as soon as possible.  Feel free to call the clinic should you have any questions or concerns. The clinic phone number is [phone].  Please show the [facility] at check-in to the Emergency Department and triage nurse.

## 2020-11-12 NOTE — Progress Notes (Signed)
[facility] OFFICE PROGRESS NOTE  Patient Care Team: Patient, [name] as PCP - General (General Practice)  ASSESSMENT & PLAN:  Malignant neoplasm of cervix ([place]) She has significant persistent pancytopenia despite multiple dose adjustment I plan to reduce AUC from 4 to 3.5 and to use creatinine of 1 for future calculation of chemotherapy dose She will also get treatment only every 4 weeks instead of every 3 weeks to allow bone marrow recovery  Pancytopenia, acquired ([place]) She has severe pancytopenia despite multiple dose adjustment I will reduce the dose of her chemotherapy further She is not symptomatic and does not need transfusion support I recommend daily multivitamin supplement I will check a vitamin B12 in the next visit  Cachexia ([place]) She has recent mild intermittent weight loss that likely contributed to her severe pancytopenia We discussed the importance of frequent small meals   No orders of the defined types were placed in this encounter.   All questions were answered. The patient knows to call the clinic with any problems, questions or concerns. The total time spent in the appointment was 30 minutes encounter with patients including review of chart and various tests results, discussions about plan of care and coordination of care plan   [name], MD 11/12/2020 10:24 AM  INTERVAL HISTORY: Please see below for problem oriented charting. She returns for further follow-up She has not lost weight since last time I saw her Denies recent bleeding No vaginal discharge Denies nausea or side effects from carboplatin so far No recent infection, fever or chills  SUMMARY OF ONCOLOGIC HISTORY: Oncology History Overview Note  Cancer Staging Malignant neoplasm of cervix ([place]) Staging form: Cervix Uteri, AJCC 8th Edition - Clinical: Stage IIIB (cT3b, cN1, cM0) - Signed by [name], MD on 05/04/2018  PD-L1 1%   Malignant neoplasm of cervix ([place])   03/13/2018 Imaging   US pelvis There are mobile echogenic foci within the endometrial cavity suggestive of gas. This is nonspecific in etiology however may be secondary to endometritis. Correlate for history of recent instrumentation. This obscures visualization of the endometrial tissue and therefore a follow-up pelvic ultrasound in 2-4 weeks is recommended after resolution of the acute symptomatology if symptoms persist to further evaluate the endometrium.    03/13/2018 Initial Diagnosis   She initially presented with PMB   04/12/2018 Pathology Results   Cervix, biopsy, mass - INVASIVE SQUAMOUS CELL CARCINOMA - SEE COMMENT   04/12/2018 Surgery   PREOPERATIVE DIAGNOSIS:  Postmenopausal vaginal bleeding. POSTOPERATIVE DIAGNOSIS: The same PROCEDURE: Exam under anesthesia, pap smear, cervical mass biopsy SURGEON:  Dr. [name]  INDICATIONS: 70 y.o. yo [id number] with PMB here for exam under anesthesia.Risks of surgery were discussed with the patient including but not limited to: bleeding which may require transfusion; infection which may require antibiotics; injury to uterus or surrounding organs; need for additional procedures including laparotomy or laparoscopy; and other postoperative/anesthesia complications. Written informed consent was obtained.    FINDINGS:  An 8-week size midline uterus.  No adnexal mass palpable on exam. Normal cervix not visualized. Friable mass seen in involving the vagina at the level of the cervix obliterating the anterior and posterior fornix.  ANESTHESIA:   General INTRAVENOUS FLUIDS:  300 ml of LR ESTIMATED BLOOD LOSS: 20 ml. SPECIMENS: pap smear, cervical mass biopsy COMPLICATIONS:  None immediate.    04/25/2018 PET scan   1. Large cervical mass may invade into the myometrium in down into the vagina, maximum SUV 21.3. There is  a malignant left external iliac node measuring 1.4 cm in short axis with maximum SUV 14.1. 2. Accentuated activity in the  cecum and ascending colon is likely physiologic given that it has no CT correlate. Correlation with the patient's colon cancer screening history is recommended. If screening is not up-to-date, appropriate screening should be considered. 3.  Aortic Atherosclerosis (ICD10-I70.0).    04/28/2018 Surgery   Pre-operative Diagnosis:  At least Stage 2B SCCa Cervical Cancer  Post-operative Diagnosis: At least clinical Stage 3A SCCa Cervical Cancer  Operation:  Exam under anesthesia due to intolerance for vaginal exam in office Cystoscopy due to concern for extension to posterior bladder (from anterior vaginal wall lesion)  Operative Findings:   1. Parametrial involvement on right  2. Right sided subvaginal tumor burden ~3cm. This approximates the right ureteral insertion into the bladder based on my exam during cystoscopy. 3. Extension of disease down upper half of vagina on lateral and posterior walls. 4. Extension of disease down to lower third of anterior vagina (versus skip lesion) thus making her Stage 3A 5. Cystoscopy revealed patent bilateral ureteral orifices and no bladder mucosa invasion or areas of concern. 6. Rectal exam grossly no disease.    05/04/2018 Cancer Staging   Staging form: Cervix Uteri, AJCC 8th Edition - Clinical: Stage IIIB (cT3b, cN1, cM0) - Signed by [name], MD on 05/04/2018   05/12/2018 Procedure   Successful 8 French right internal jugular vein power port placement with its tip at the SVC/RA junction   05/13/2018 - 06/24/2018 Chemotherapy   The patient had weekly cisplatin given with concurrent radiation    05/17/2018 - 07/27/2018 Radiation Therapy   Radiation treatment dates:05/17/2018-07/27/2018  Site/dose:1. Cervix, 1.8 Gy in 25 fractions for a total dose of 45 Gy 2. Pelvis  Boost, 1.8 Gy in 5 fractions for a total dose of 9 Gy 3. Cervix,5.5Gy in 5 fractions for a total dose of 27.5Gy   06/03/2018 Adverse  Reaction   She missed her chemo due to severe depression and was sent to the ER for suicidal ideation.  She was seen by psychiatrist.   10/27/2018 PET scan   IMPRESSION: 1. Complete metabolic response to therapy, without residual or recurrent hypermetabolic disease. 2.  Aortic Atherosclerosis (ICD10-I70.0).    11/17/2018 Imaging   Successful right IJ vein Port-A-Cath explant.   03/29/2020 PET scan   1. Hypermetabolic lesion along the right vaginal cuff is highly worrisome for recurrent cervical cancer. No evidence of distant metastatic disease. 2.  Aortic atherosclerosis (ICD10-I70.0).   04/26/2020 -  Chemotherapy   The patient had CARBOplatin  for chemotherapy treatment.      Genetic Testing   Patient has genetic testing done for PD-L1. Results revealed patient has the following:  PD-L1 combined positive score (CPS): 1%   07/08/2020 Imaging   1. Unchanged post treatment appearance of the pelvis, including a soft tissue nodule in the low right hemipelvis adjacent to the vagina measuring 2.8 x 1.7 cm, previously FDG avid and consistent with malignancy. 2. No evidence of nodal or distant metastatic disease in the abdomen or pelvis. 3. Aortic Atherosclerosis (ICD10-I70.0).   10/14/2020 Imaging   1. Unchanged right eccentric soft tissue mass in the low right hemipelvis centered on the cervix and vagina, measuring 2.9 x 1.9 cm. 2. New small volume fluid within the endometrial cavity, abnormal in the postmenopausal setting and suggesting obstruction of the cervical os by soft tissue mass. 3. No evidence of lymphadenopathy or distant metastatic disease in the  abdomen or pelvis.   Aortic Atherosclerosis (ICD10-I70.0).     REVIEW OF SYSTEMS:   Constitutional: Denies fevers, chills or abnormal weight loss Eyes: Denies blurriness of vision Ears, nose, mouth, throat, and face: Denies mucositis or sore throat Respiratory: Denies cough, dyspnea or wheezes Cardiovascular: Denies palpitation,  chest discomfort or lower extremity swelling Gastrointestinal:  Denies nausea, heartburn or change in bowel habits Skin: Denies abnormal skin rashes Lymphatics: Denies new lymphadenopathy or easy bruising Neurological:Denies numbness, tingling or new weaknesses Behavioral/Psych: Mood is stable, no new changes  All other systems were reviewed with the patient and are negative.  I have reviewed the past medical history, past surgical history, social history and family history with the patient and they are unchanged from previous note.  ALLERGIES:  has No Known Allergies.  MEDICATIONS:  Current Outpatient Medications  Medication Sig Dispense Refill  . Multiple Vitamin (MULTIVITAMIN) tablet Take 1 tablet by mouth daily.    . ondansetron (ZOFRAN) 8 MG tablet Take 1 tablet (8 mg total) by mouth every 8 (eight) hours as needed for refractory nausea / vomiting. Start on day 3 after carboplatin chemo. 30 tablet 1  . prochlorperazine (COMPAZINE) 10 MG tablet Take 1 tablet (10 mg total) by mouth every 6 (six) hours as needed (Nausea or vomiting). 30 tablet 1   No current facility-administered medications for this visit.   Facility-Administered Medications Ordered in Other Visits  Medication Dose Route Frequency Provider Last Rate Last Admin  . CARBOplatin (PARAPLATIN) 210 mg in sodium chloride 0.9 % 100 mL chemo infusion  210 mg Intravenous Once Alvy Bimler, Ni, MD      . dexamethasone (DECADRON) 10 mg in sodium chloride 0.9 % 50 mL IVPB  10 mg Intravenous Once Gorsuch, Ni, MD      . fosaprepitant (EMEND) 150 mg in sodium chloride 0.9 % 145 mL IVPB  150 mg Intravenous Once Alvy Bimler, Ni, MD        PHYSICAL EXAMINATION: ECOG PERFORMANCE STATUS: 1 - Symptomatic but completely ambulatory  Vitals:   11/12/20 0915  BP: (!) 109/48  Pulse: 85  Resp: 18  Temp: 98.5 F (36.9 C)  SpO2: 100%   Filed Weights   11/12/20 0915  Weight: 91 lb (41.3 kg)    GENERAL:alert, no distress and comfortable SKIN:  skin color, texture, turgor are normal, no rashes or significant lesions EYES: normal, Conjunctiva are pink and non-injected, sclera clear OROPHARYNX:no exudate, no erythema and lips, buccal mucosa, and tongue normal  NECK: supple, thyroid normal size, non-tender, without nodularity LYMPH:  no palpable lymphadenopathy in the cervical, axillary or inguinal LUNGS: clear to auscultation and percussion with normal breathing effort HEART: regular rate & rhythm and no murmurs and no lower extremity edema ABDOMEN:abdomen soft, non-tender and normal bowel sounds Musculoskeletal:no cyanosis of digits and no clubbing  NEURO: alert & oriented x 3 with fluent speech, no focal motor/sensory deficits  LABORATORY DATA:  I have reviewed the data as listed    Component Value Date/Time   NA 137 11/12/2020 0855   K 4.4 11/12/2020 0855   CL 104 11/12/2020 0855   CO2 25 11/12/2020 0855   GLUCOSE 85 11/12/2020 0855   BUN 15 11/12/2020 0855   CREATININE 0.87 11/12/2020 0855   CREATININE 0.83 03/11/2020 0951   CALCIUM 8.9 11/12/2020 0855   PROT 7.1 11/12/2020 0855   ALBUMIN 3.9 11/12/2020 0855   AST 20 11/12/2020 0855   AST 14 (L) 12/11/2019 1141   ALT 16 11/12/2020 0855  ALT 7 12/11/2019 1141   ALKPHOS 78 11/12/2020 0855   BILITOT 0.3 11/12/2020 0855   BILITOT 0.4 12/11/2019 1141   GFRNONAA >60 11/12/2020 0855   GFRNONAA >60 03/11/2020 0951   GFRAA >60 06/14/2020 0844   GFRAA >60 03/11/2020 0951    No results found for: SPEP, UPEP  Lab Results  Component Value Date   WBC 3.6 (L) 11/12/2020   NEUTROABS 2.3 11/12/2020   HGB 11.1 (L) 11/12/2020   HCT 33.6 (L) 11/12/2020   MCV 101.2 (H) 11/12/2020   PLT 90 (L) 11/12/2020      Chemistry      Component Value Date/Time   NA 137 11/12/2020 0855   K 4.4 11/12/2020 0855   CL 104 11/12/2020 0855   CO2 25 11/12/2020 0855   BUN 15 11/12/2020 0855   CREATININE 0.87 11/12/2020 0855   CREATININE 0.83 03/11/2020 0951      Component Value  Date/Time   CALCIUM 8.9 11/12/2020 0855   ALKPHOS 78 11/12/2020 0855   AST 20 11/12/2020 0855   AST 14 (L) 12/11/2019 1141   ALT 16 11/12/2020 0855   ALT 7 12/11/2019 1141   BILITOT 0.3 11/12/2020 0855   BILITOT 0.4 12/11/2019 1141       RADIOGRAPHIC STUDIES: I have personally reviewed the radiological images as listed and agreed with the findings in the report. CT ABDOMEN PELVIS W CONTRAST  Result Date: 10/14/2020 CLINICAL DATA:  Cervical cancer, assess treatment response EXAM: CT ABDOMEN AND PELVIS WITH CONTRAST TECHNIQUE: Multidetector CT imaging of the abdomen and pelvis was performed using the standard protocol following bolus administration of intravenous contrast. CONTRAST:  76m OMNIPAQUE IOHEXOL 300 MG/ML SOLN, additional oral enteric contrast COMPARISON:  CT abdomen pelvis, 07/08/2020, PET-CT, 03/29/2020, CT abdomen pelvis, 03/14/2020 FINDINGS: Lower chest: No acute abnormality. Bandlike scarring of the left lung base. Hepatobiliary: No solid liver abnormality is seen. No gallstones, gallbladder wall thickening, or biliary dilatation. Pancreas: Unremarkable. No pancreatic ductal dilatation or surrounding inflammatory changes. Spleen: Normal in size without significant abnormality. Adrenals/Urinary Tract: Adrenal glands are unremarkable. Kidneys are normal, without renal calculi, solid lesion, or hydronephrosis. Bladder is unremarkable. Stomach/Bowel: Stomach is within normal limits. Appendix appears normal. No evidence of bowel wall thickening, distention, or inflammatory changes. Occasional sigmoid diverticula. Vascular/Lymphatic: Scattered aortic atherosclerosis. No enlarged abdominal or pelvic lymph nodes. Reproductive: Unchanged right eccentric soft tissue mass in the low right hemipelvis centered on the cervix and vagina, measuring 2.9 x 1.9 cm (series 2, image 63). There is some adjacent soft tissue stranding in the low pelvis. There is new small volume fluid within the endometrial  cavity (series 2, image 61). Other: No abdominal wall hernia or abnormality. No abdominopelvic ascites. Musculoskeletal: No acute or significant osseous findings. Unchanged Schmorl type superior endplate deformity of the L5 vertebral body. IMPRESSION: 1. Unchanged right eccentric soft tissue mass in the low right hemipelvis centered on the cervix and vagina, measuring 2.9 x 1.9 cm. 2. New small volume fluid within the endometrial cavity, abnormal in the postmenopausal setting and suggesting obstruction of the cervical os by soft tissue mass. 3. No evidence of lymphadenopathy or distant metastatic disease in the abdomen or pelvis. Aortic Atherosclerosis (ICD10-I70.0). Electronically Signed   By: AEddie CandleM.D.   On: 10/14/2020 13:54

## 2020-11-12 NOTE — Assessment & Plan Note (Signed)
She has severe pancytopenia despite multiple dose adjustment I will reduce the dose of her chemotherapy further She is not symptomatic and does not need transfusion support I recommend daily multivitamin supplement I will check a vitamin B12 in the next visit

## 2020-11-12 NOTE — Assessment & Plan Note (Signed)
She has recent mild intermittent weight loss that likely contributed to her severe pancytopenia We discussed the importance of frequent small meals 

## 2020-12-10 ENCOUNTER — Telehealth: Payer: Self-pay

## 2020-12-10 ENCOUNTER — Inpatient Hospital Stay (HOSPITAL_BASED_OUTPATIENT_CLINIC_OR_DEPARTMENT_OTHER): Payer: Medicare Other | Admitting: Hematology and Oncology

## 2020-12-10 ENCOUNTER — Other Ambulatory Visit: Payer: Self-pay

## 2020-12-10 ENCOUNTER — Other Ambulatory Visit: Payer: Self-pay | Admitting: Hematology and Oncology

## 2020-12-10 ENCOUNTER — Inpatient Hospital Stay: Payer: Medicare Other

## 2020-12-10 ENCOUNTER — Encounter: Payer: Self-pay | Admitting: Hematology and Oncology

## 2020-12-10 ENCOUNTER — Telehealth: Payer: Self-pay | Admitting: Hematology and Oncology

## 2020-12-10 VITALS — BP 138/61 | HR 93 | Temp 98.0°F | Resp 18 | Ht 62.0 in | Wt 92.6 lb

## 2020-12-10 DIAGNOSIS — E538 Deficiency of other specified B group vitamins: Secondary | ICD-10-CM

## 2020-12-10 DIAGNOSIS — C539 Malignant neoplasm of cervix uteri, unspecified: Secondary | ICD-10-CM | POA: Diagnosis not present

## 2020-12-10 DIAGNOSIS — D61818 Other pancytopenia: Secondary | ICD-10-CM

## 2020-12-10 DIAGNOSIS — Z7189 Other specified counseling: Secondary | ICD-10-CM

## 2020-12-10 DIAGNOSIS — D539 Nutritional anemia, unspecified: Secondary | ICD-10-CM

## 2020-12-10 DIAGNOSIS — Z5111 Encounter for antineoplastic chemotherapy: Secondary | ICD-10-CM | POA: Diagnosis not present

## 2020-12-10 DIAGNOSIS — R64 Cachexia: Secondary | ICD-10-CM | POA: Diagnosis not present

## 2020-12-10 LAB — CBC WITH DIFFERENTIAL (CANCER CENTER ONLY)
Abs Immature Granulocytes: 0.03 10*3/uL (ref 0.00–0.07)
Basophils Absolute: 0 10*3/uL (ref 0.0–0.1)
Basophils Relative: 1 %
Eosinophils Absolute: 0.1 10*3/uL (ref 0.0–0.5)
Eosinophils Relative: 1 %
HCT: 33.7 % — ABNORMAL LOW (ref 36.0–46.0)
Hemoglobin: 11.1 g/dL — ABNORMAL LOW (ref 12.0–15.0)
Immature Granulocytes: 1 %
Lymphocytes Relative: 15 %
Lymphs Abs: 0.6 10*3/uL — ABNORMAL LOW (ref 0.7–4.0)
MCH: 32.6 pg (ref 26.0–34.0)
MCHC: 32.9 g/dL (ref 30.0–36.0)
MCV: 98.8 fL (ref 80.0–100.0)
Monocytes Absolute: 0.5 10*3/uL (ref 0.1–1.0)
Monocytes Relative: 12 %
Neutro Abs: 3 10*3/uL (ref 1.7–7.7)
Neutrophils Relative %: 70 %
Platelet Count: 133 10*3/uL — ABNORMAL LOW (ref 150–400)
RBC: 3.41 MIL/uL — ABNORMAL LOW (ref 3.87–5.11)
RDW: 13.4 % (ref 11.5–15.5)
WBC Count: 4.2 10*3/uL (ref 4.0–10.5)
nRBC: 0 % (ref 0.0–0.2)

## 2020-12-10 LAB — COMPREHENSIVE METABOLIC PANEL
ALT: 10 U/L (ref 0–44)
AST: 16 U/L (ref 15–41)
Albumin: 4 g/dL (ref 3.5–5.0)
Alkaline Phosphatase: 71 U/L (ref 38–126)
Anion gap: 12 (ref 5–15)
BUN: 18 mg/dL (ref 8–23)
CO2: 23 mmol/L (ref 22–32)
Calcium: 8.8 mg/dL — ABNORMAL LOW (ref 8.9–10.3)
Chloride: 105 mmol/L (ref 98–111)
Creatinine, Ser: 0.8 mg/dL (ref 0.44–1.00)
GFR, Estimated: >60 mL/min (ref >60)
Glucose, Bld: 90 mg/dL (ref 70–99)
Potassium: 4 mmol/L (ref 3.5–5.1)
Sodium: 140 mmol/L (ref 135–145)
Total Bilirubin: 0.4 mg/dL (ref 0.3–1.2)
Total Protein: 7.3 g/dL (ref 6.5–8.1)

## 2020-12-10 LAB — VITAMIN B12: Vitamin B-12: 62 pg/mL — ABNORMAL LOW (ref 180–914)

## 2020-12-10 MED ORDER — SODIUM CHLORIDE 0.9 % IV SOLN
10.0000 mg | Freq: Once | INTRAVENOUS | Status: AC
  Administered 2020-12-10: 10 mg via INTRAVENOUS
  Filled 2020-12-10: qty 10

## 2020-12-10 MED ORDER — SODIUM CHLORIDE 0.9 % IV SOLN
210.3500 mg | Freq: Once | INTRAVENOUS | Status: AC
  Administered 2020-12-10: 210 mg via INTRAVENOUS
  Filled 2020-12-10: qty 21

## 2020-12-10 MED ORDER — DIPHENHYDRAMINE HCL 50 MG/ML IJ SOLN
INTRAMUSCULAR | Status: AC
  Filled 2020-12-10: qty 1

## 2020-12-10 MED ORDER — SODIUM CHLORIDE 0.9 % IV SOLN
150.0000 mg | Freq: Once | INTRAVENOUS | Status: AC
  Administered 2020-12-10: 150 mg via INTRAVENOUS
  Filled 2020-12-10: qty 150

## 2020-12-10 MED ORDER — SODIUM CHLORIDE 0.9 % IV SOLN
Freq: Once | INTRAVENOUS | Status: AC
  Filled 2020-12-10: qty 250

## 2020-12-10 MED ORDER — CYANOCOBALAMIN 1000 MCG/ML IJ SOLN
INTRAMUSCULAR | Status: AC
  Filled 2020-12-10: qty 1

## 2020-12-10 MED ORDER — FAMOTIDINE IN NACL 20-0.9 MG/50ML-% IV SOLN
INTRAVENOUS | Status: AC
  Filled 2020-12-10: qty 50

## 2020-12-10 MED ORDER — DIPHENHYDRAMINE HCL 50 MG/ML IJ SOLN
25.0000 mg | Freq: Once | INTRAMUSCULAR | Status: AC
  Administered 2020-12-10: 25 mg via INTRAVENOUS

## 2020-12-10 MED ORDER — FAMOTIDINE IN NACL 20-0.9 MG/50ML-% IV SOLN
20.0000 mg | Freq: Once | INTRAVENOUS | Status: AC
  Administered 2020-12-10: 20 mg via INTRAVENOUS

## 2020-12-10 MED ORDER — CYANOCOBALAMIN 1000 MCG/ML IJ SOLN
1000.0000 ug | Freq: Once | INTRAMUSCULAR | Status: AC
  Administered 2020-12-10: 1000 ug via INTRAMUSCULAR

## 2020-12-10 MED ORDER — PALONOSETRON HCL INJECTION 0.25 MG/5ML
INTRAVENOUS | Status: AC
  Filled 2020-12-10: qty 5

## 2020-12-10 MED ORDER — PALONOSETRON HCL INJECTION 0.25 MG/5ML
0.2500 mg | Freq: Once | INTRAVENOUS | Status: AC
  Administered 2020-12-10: 0.25 mg via INTRAVENOUS

## 2020-12-10 NOTE — Addendum Note (Signed)
Addended byAlvy Bimler, Ericah Scotto on: 12/10/2020 10:26 AM   Modules accepted: Level of Service

## 2020-12-10 NOTE — Telephone Encounter (Signed)
Scheduled per sch msg. Gave avs and calendar  

## 2020-12-10 NOTE — Assessment & Plan Note (Signed)
She has recent mild intermittent weight loss that likely contributed to her severe pancytopenia We discussed the importance of frequent small meals

## 2020-12-10 NOTE — Progress Notes (Addendum)
[facility] OFFICE PROGRESS NOTE  Patient Care Team: Patient, [name] as PCP - General (General Practice)  ASSESSMENT & PLAN:  Malignant neoplasm of cervix (Erin Kent) Her last imaging studies show stable changes She is not symptomatic I have reduced the dose of carboplatin several times and I believe we have probably reach nadir response We will proceed with treatment as scheduled and I plan to repeat CT imaging in 2 weeks I recommend her caregiver to be present for discussion about plan of care  Pancytopenia, acquired (Erin Kent) Her chemotherapy dose has been reduced several times She is not symptomatic from pancytopenia We will proceed with treatment as scheduled  Cachexia (Erin Kent) She has recent mild intermittent weight loss that likely contributed to her severe pancytopenia We discussed the importance of frequent small meals  Vitamin B12 deficiency Soon after the patient is seen, she was found to have severe vitamin B12 deficiency We will start her on B12 injection weekly for a month    Orders Placed This Encounter  Procedures  . CT ABDOMEN PELVIS W CONTRAST    Standing Status:   Future    Standing Expiration Date:   12/10/2021    Order Specific Question:   If indicated for the ordered procedure, I authorize the administration of contrast media per Radiology protocol    Answer:   Yes    Order Specific Question:   Preferred imaging location?    Answer:   [facility]    Order Specific Question:   Radiology Contrast Protocol - do NOT remove file path    Answer:   [email or web address]    All questions were answered. The patient knows to call the clinic with any problems, questions or concerns. The total time spent in the appointment was 20 minutes encounter with patients including review of chart and various tests results, discussions about plan of care and coordination of care plan   [name], MD 12/10/2020 10:26  AM  INTERVAL HISTORY: Please see below for problem oriented charting. She returns for chemotherapy She tolerated last cycle of treatment well No recent infection, fever or chills No recent bleeding She denies nausea  SUMMARY OF ONCOLOGIC HISTORY: Oncology History Overview Note  Cancer Staging Malignant neoplasm of cervix (Erin Kent) Staging form: Cervix Uteri, AJCC 8th Edition - Clinical: Stage IIIB (cT3b, cN1, cM0) - Signed by [name], MD on 05/04/2018  PD-L1 1%   Malignant neoplasm of cervix (Erin Kent)  03/13/2018 Imaging   US pelvis There are mobile echogenic foci within the endometrial cavity suggestive of gas. This is nonspecific in etiology however may be secondary to endometritis. Correlate for history of recent instrumentation. This obscures visualization of the endometrial tissue and therefore a follow-up pelvic ultrasound in 2-4 weeks is recommended after resolution of the acute symptomatology if symptoms persist to further evaluate the endometrium.    03/13/2018 Initial Diagnosis   She initially presented with PMB   04/12/2018 Pathology Results   Cervix, biopsy, mass - INVASIVE SQUAMOUS CELL CARCINOMA - SEE COMMENT   04/12/2018 Surgery   PREOPERATIVE DIAGNOSIS:  Postmenopausal vaginal bleeding. POSTOPERATIVE DIAGNOSIS: The same PROCEDURE: Exam under anesthesia, pap smear, cervical mass biopsy SURGEON:  Dr. [name]  INDICATIONS: 70 y.o. yo G0P0000 with PMB here for exam under anesthesia.Risks of surgery were discussed with the patient including but not limited to: bleeding which may require transfusion; infection which may require antibiotics; injury to uterus or surrounding organs; need for additional procedures including laparotomy or laparoscopy; and other  postoperative/anesthesia complications. Written informed consent was obtained.    FINDINGS:  An 8-week size midline uterus.  No adnexal mass palpable on exam. Normal cervix not visualized. Friable mass seen in  involving the vagina at the level of the cervix obliterating the anterior and posterior fornix.  ANESTHESIA:   General INTRAVENOUS FLUIDS:  300 ml of LR ESTIMATED BLOOD LOSS: 20 ml. SPECIMENS: pap smear, cervical mass biopsy COMPLICATIONS:  None immediate.    04/25/2018 PET scan   1. Large cervical mass may invade into the myometrium in down into the vagina, maximum SUV 21.3. There is a malignant left external iliac node measuring 1.4 cm in short axis with maximum SUV 14.1. 2. Accentuated activity in the cecum and ascending colon is likely physiologic given that it has no CT correlate. Correlation with the patient's colon cancer screening history is recommended. If screening is not up-to-date, appropriate screening should be considered. 3.  Aortic Atherosclerosis (ICD10-I70.0).    04/28/2018 Surgery   Pre-operative Diagnosis:  At least Stage 2B SCCa Cervical Cancer  Post-operative Diagnosis: At least clinical Stage 3A SCCa Cervical Cancer  Operation:  Exam under anesthesia due to intolerance for vaginal exam in office Cystoscopy due to concern for extension to posterior bladder (from anterior vaginal wall lesion)  Operative Findings:   1. Parametrial involvement on right  2. Right sided subvaginal tumor burden ~3cm. This approximates the right ureteral insertion into the bladder based on my exam during cystoscopy. 3. Extension of disease down upper half of vagina on lateral and posterior walls. 4. Extension of disease down to lower third of anterior vagina (versus skip lesion) thus making her Stage 3A 5. Cystoscopy revealed patent bilateral ureteral orifices and no bladder mucosa invasion or areas of concern. 6. Rectal exam grossly no disease.    05/04/2018 Cancer Staging   Staging form: Cervix Uteri, AJCC 8th Edition - Clinical: Stage IIIB (cT3b, cN1, cM0) - Signed by [name], MD on 05/04/2018   05/12/2018 Procedure   Successful 8 French right internal jugular vein power  port placement with its tip at the SVC/RA junction   05/13/2018 - 06/24/2018 Chemotherapy   The patient had weekly cisplatin given with concurrent radiation    05/17/2018 - 07/27/2018 Radiation Therapy   Radiation treatment dates:05/17/2018-07/27/2018  Site/dose:1. Cervix, 1.8 Gy in 25 fractions for a total dose of 45 Gy 2. Pelvis  Boost, 1.8 Gy in 5 fractions for a total dose of 9 Gy 3. Cervix,5.5Gy in 5 fractions for a total dose of 27.5Gy   06/03/2018 Adverse Reaction   She missed her chemo due to severe depression and was sent to the ER for suicidal ideation.  She was seen by psychiatrist.   10/27/2018 PET scan   IMPRESSION: 1. Complete metabolic response to therapy, without residual or recurrent hypermetabolic disease. 2.  Aortic Atherosclerosis (ICD10-I70.0).    11/17/2018 Imaging   Successful right IJ vein Port-A-Cath explant.   03/29/2020 PET scan   1. Hypermetabolic lesion along the right vaginal cuff is highly worrisome for recurrent cervical cancer. No evidence of distant metastatic disease. 2.  Aortic atherosclerosis (ICD10-I70.0).   04/26/2020 -  Chemotherapy   The patient had CARBOplatin  for chemotherapy treatment.      Genetic Testing   Patient has genetic testing done for PD-L1. Results revealed patient has the following:  PD-L1 combined positive score (CPS): 1%   07/08/2020 Imaging   1. Unchanged post treatment appearance of the pelvis, including a soft tissue nodule in the low  right hemipelvis adjacent to the vagina measuring 2.8 x 1.7 cm, previously FDG avid and consistent with malignancy. 2. No evidence of nodal or distant metastatic disease in the abdomen or pelvis. 3. Aortic Atherosclerosis (ICD10-I70.0).   10/14/2020 Imaging   1. Unchanged right eccentric soft tissue mass in the low right hemipelvis centered on the cervix and vagina, measuring 2.9 x 1.9 cm. 2. New small volume fluid within the endometrial cavity,  abnormal in the postmenopausal setting and suggesting obstruction of the cervical os by soft tissue mass. 3. No evidence of lymphadenopathy or distant metastatic disease in the abdomen or pelvis.   Aortic Atherosclerosis (ICD10-I70.0).     REVIEW OF SYSTEMS:   Constitutional: Denies fevers, chills or abnormal weight loss Eyes: Denies blurriness of vision Ears, nose, mouth, throat, and face: Denies mucositis or sore throat Respiratory: Denies cough, dyspnea or wheezes Cardiovascular: Denies palpitation, chest discomfort or lower extremity swelling Gastrointestinal:  Denies nausea, heartburn or change in bowel habits Skin: Denies abnormal skin rashes Lymphatics: Denies new lymphadenopathy or easy bruising Neurological:Denies numbness, tingling or new weaknesses Behavioral/Psych: Mood is stable, no new changes  All other systems were reviewed with the patient and are negative.  I have reviewed the past medical history, past surgical history, social history and family history with the patient and they are unchanged from previous note.  ALLERGIES:  has No Known Allergies.  MEDICATIONS:  Current Outpatient Medications  Medication Sig Dispense Refill  . Multiple Vitamin (MULTIVITAMIN) tablet Take 1 tablet by mouth daily.    . ondansetron (ZOFRAN) 8 MG tablet Take 1 tablet (8 mg total) by mouth every 8 (eight) hours as needed for refractory nausea / vomiting. Start on day 3 after carboplatin chemo. 30 tablet 1  . prochlorperazine (COMPAZINE) 10 MG tablet Take 1 tablet (10 mg total) by mouth every 6 (six) hours as needed (Nausea or vomiting). 30 tablet 1   No current facility-administered medications for this visit.   Facility-Administered Medications Ordered in Other Visits  Medication Dose Route Frequency Provider Last Rate Last Admin  . CARBOplatin (PARAPLATIN) 210 mg in sodium chloride 0.9 % 100 mL chemo infusion  210 mg Intravenous Once Alvy Bimler, Maurica Omura, MD        PHYSICAL  EXAMINATION: ECOG PERFORMANCE STATUS: 1 - Symptomatic but completely ambulatory  Vitals:   12/10/20 0815  BP: 138/61  Pulse: 93  Resp: 18  Temp: 98 F (36.7 C)  SpO2: 99%   Filed Weights   12/10/20 0815  Weight: 92 lb 9.6 oz (42 kg)    GENERAL:alert, no distress and comfortable NEURO: alert & oriented x 3 with fluent speech, no focal motor/sensory deficits  LABORATORY DATA:  I have reviewed the data as listed    Component Value Date/Time   NA 140 12/10/2020 0759   K 4.0 12/10/2020 0759   CL 105 12/10/2020 0759   CO2 23 12/10/2020 0759   GLUCOSE 90 12/10/2020 0759   BUN 18 12/10/2020 0759   CREATININE 0.80 12/10/2020 0759   CREATININE 0.83 03/11/2020 0951   CALCIUM 8.8 (L) 12/10/2020 0759   PROT 7.3 12/10/2020 0759   ALBUMIN 4.0 12/10/2020 0759   AST 16 12/10/2020 0759   AST 14 (L) 12/11/2019 1141   ALT 10 12/10/2020 0759   ALT 7 12/11/2019 1141   ALKPHOS 71 12/10/2020 0759   BILITOT 0.4 12/10/2020 0759   BILITOT 0.4 12/11/2019 1141   GFRNONAA >60 12/10/2020 0759   GFRNONAA >60 03/11/2020 0951   GFRAA >60 06/14/2020  [place] >60 03/11/2020 0951    No results found for: SPEP, UPEP  Lab Results  Component Value Date   WBC 4.2 12/10/2020   NEUTROABS 3.0 12/10/2020   HGB 11.1 (L) 12/10/2020   HCT 33.7 (L) 12/10/2020   MCV 98.8 12/10/2020   PLT 133 (L) 12/10/2020      Chemistry      Component Value Date/Time   NA 140 12/10/2020 0759   K 4.0 12/10/2020 0759   CL 105 12/10/2020 0759   CO2 23 12/10/2020 0759   BUN 18 12/10/2020 0759   CREATININE 0.80 12/10/2020 0759   CREATININE 0.83 03/11/2020 0951      Component Value Date/Time   CALCIUM 8.8 (L) 12/10/2020 0759   ALKPHOS 71 12/10/2020 0759   AST 16 12/10/2020 0759   AST 14 (L) 12/11/2019 1141   ALT 10 12/10/2020 0759   ALT 7 12/11/2019 1141   BILITOT 0.4 12/10/2020 0759   BILITOT 0.4 12/11/2019 1141

## 2020-12-10 NOTE — Assessment & Plan Note (Signed)
Soon after the patient is seen, she was found to have severe vitamin B12 deficiency We will start her on B12 injection weekly for a month

## 2020-12-10 NOTE — Assessment & Plan Note (Signed)
Her chemotherapy dose has been reduced several times She is not symptomatic from pancytopenia We will proceed with treatment as scheduled

## 2020-12-10 NOTE — Telephone Encounter (Signed)
Given a copy of b12 results and per Dr. Alvy Bimler this is the reason her blood counts are low. She needs b12 injection weekly starting today and monthly. She verbalized understanding. Injection appts added.

## 2020-12-10 NOTE — Assessment & Plan Note (Signed)
Her last imaging studies show stable changes She is not symptomatic I have reduced the dose of carboplatin several times and I believe we have probably reach nadir response We will proceed with treatment as scheduled and I plan to repeat CT imaging in 2 weeks I recommend her caregiver to be present for discussion about plan of care

## 2020-12-10 NOTE — Patient Instructions (Addendum)
[facility] Discharge Instructions for Patients Receiving Chemotherapy  Today you received the following chemotherapy agent: Carboplatin  To help prevent nausea and vomiting after your treatment, we encourage you to take your nausea medication as directed by your MD.   If you develop nausea and vomiting that is not controlled by your nausea medication, call the clinic.   BELOW ARE SYMPTOMS THAT SHOULD BE REPORTED IMMEDIATELY:  *FEVER GREATER THAN 100.5 F  *CHILLS WITH OR WITHOUT FEVER  NAUSEA AND VOMITING THAT IS NOT CONTROLLED WITH YOUR NAUSEA MEDICATION  *UNUSUAL SHORTNESS OF BREATH  *UNUSUAL BRUISING OR BLEEDING  TENDERNESS IN MOUTH AND THROAT WITH OR WITHOUT PRESENCE OF ULCERS  *URINARY PROBLEMS  *BOWEL PROBLEMS  UNUSUAL RASH Items with * indicate a potential emergency and should be followed up as soon as possible.  Feel free to call the clinic should you have any questions or concerns. The clinic phone number is [phone].  Please show the [facility] at check-in to the Emergency Department and triage nurse.  Cyanocobalamin, Vitamin B12 injection What is this medicine? CYANOCOBALAMIN (sye an oh koe BAL a min) is a man made form of vitamin B12. Vitamin B12 is used in the growth of healthy blood cells, nerve cells, and proteins in the body. It also helps with the metabolism of fats and carbohydrates. This medicine is used to treat people who can not absorb vitamin B12. This medicine may be used for other purposes; ask your health care provider or pharmacist if you have questions. COMMON BRAND NAME(S): B-12 Compliance Kit, B-12 Injection Kit, Cyomin, LA-12, Nutri-Twelve, Physicians EZ Use B-12, Primabalt What should I tell my health care provider before I take this medicine? They need to know if you have any of these conditions:  kidney disease  Leber's disease  megaloblastic anemia  an unusual or allergic reaction to cyanocobalamin, cobalt,  other medicines, foods, dyes, or preservatives  pregnant or trying to get pregnant  breast-feeding How should I use this medicine? This medicine is injected into a muscle or deeply under the skin. It is usually given by a health care professional in a clinic or doctor's office. However, your doctor may teach you how to inject yourself. Follow all instructions. Talk to your pediatrician regarding the use of this medicine in children. Special care may be needed. Overdosage: If you think you have taken too much of this medicine contact a poison control center or emergency room at once. NOTE: This medicine is only for you. Do not share this medicine with others. What if I miss a dose? If you are given your dose at a clinic or doctor's office, call to reschedule your appointment. If you give your own injections and you miss a dose, take it as soon as you can. If it is almost time for your next dose, take only that dose. Do not take double or extra doses. What may interact with this medicine?  colchicine  heavy alcohol intake This list may not describe all possible interactions. Give your health care provider a list of all the medicines, herbs, non-prescription drugs, or dietary supplements you use. Also tell them if you smoke, drink alcohol, or use illegal drugs. Some items may interact with your medicine. What should I watch for while using this medicine? Visit your doctor or health care professional regularly. You may need blood work done while you are taking this medicine. You may need to follow a special diet. Talk to your doctor. Limit your  alcohol intake and avoid smoking to get the best benefit. What side effects may I notice from receiving this medicine? Side effects that you should report to your doctor or health care professional as soon as possible:  allergic reactions like skin rash, itching or hives, swelling of the face, lips, or tongue  blue tint to skin  chest tightness,  pain  difficulty breathing, wheezing  dizziness  red, swollen painful area on the leg Side effects that usually do not require medical attention (report to your doctor or health care professional if they continue or are bothersome):  diarrhea  headache This list may not describe all possible side effects. Call your doctor for medical advice about side effects. You may report side effects to FDA at 1-800-FDA-1088. Where should I keep my medicine? Keep out of the reach of children. Store at room temperature between 15 and 30 degrees C (59 and 85 degrees F). Protect from light. Throw away any unused medicine after the expiration date. NOTE: This sheet is a summary. It may not cover all possible information. If you have questions about this medicine, talk to your doctor, pharmacist, or health care provider.  2021 Elsevier/Gold Standard (2007-12-12 22:10:20)

## 2020-12-17 ENCOUNTER — Inpatient Hospital Stay: Payer: Medicare Other | Attending: Hematology and Oncology

## 2020-12-17 ENCOUNTER — Other Ambulatory Visit: Payer: Self-pay

## 2020-12-17 VITALS — BP 109/97 | HR 75 | Temp 98.2°F | Resp 17

## 2020-12-17 DIAGNOSIS — R64 Cachexia: Secondary | ICD-10-CM | POA: Insufficient documentation

## 2020-12-17 DIAGNOSIS — E538 Deficiency of other specified B group vitamins: Secondary | ICD-10-CM | POA: Diagnosis not present

## 2020-12-17 DIAGNOSIS — I700 Atherosclerosis of aorta: Secondary | ICD-10-CM | POA: Insufficient documentation

## 2020-12-17 DIAGNOSIS — G96191 Perineural cyst: Secondary | ICD-10-CM | POA: Diagnosis not present

## 2020-12-17 DIAGNOSIS — D61818 Other pancytopenia: Secondary | ICD-10-CM | POA: Insufficient documentation

## 2020-12-17 DIAGNOSIS — I517 Cardiomegaly: Secondary | ICD-10-CM | POA: Insufficient documentation

## 2020-12-17 DIAGNOSIS — Z9221 Personal history of antineoplastic chemotherapy: Secondary | ICD-10-CM | POA: Insufficient documentation

## 2020-12-17 DIAGNOSIS — Z79899 Other long term (current) drug therapy: Secondary | ICD-10-CM | POA: Insufficient documentation

## 2020-12-17 DIAGNOSIS — C539 Malignant neoplasm of cervix uteri, unspecified: Secondary | ICD-10-CM | POA: Insufficient documentation

## 2020-12-17 DIAGNOSIS — Z5112 Encounter for antineoplastic immunotherapy: Secondary | ICD-10-CM | POA: Diagnosis present

## 2020-12-17 DIAGNOSIS — K5730 Diverticulosis of large intestine without perforation or abscess without bleeding: Secondary | ICD-10-CM | POA: Diagnosis not present

## 2020-12-17 DIAGNOSIS — Z923 Personal history of irradiation: Secondary | ICD-10-CM | POA: Insufficient documentation

## 2020-12-17 MED ORDER — CYANOCOBALAMIN 1000 MCG/ML IJ SOLN
1000.0000 ug | Freq: Once | INTRAMUSCULAR | Status: AC
  Administered 2020-12-17: 1000 ug via INTRAMUSCULAR

## 2020-12-17 MED ORDER — CYANOCOBALAMIN 1000 MCG/ML IJ SOLN
INTRAMUSCULAR | Status: AC
  Filled 2020-12-17: qty 1

## 2020-12-17 NOTE — Patient Instructions (Signed)

## 2020-12-23 ENCOUNTER — Other Ambulatory Visit: Payer: Self-pay

## 2020-12-23 ENCOUNTER — Ambulatory Visit (HOSPITAL_COMMUNITY)
Admission: RE | Admit: 2020-12-23 | Discharge: 2020-12-23 | Disposition: A | Discharge status: Home / Self Care | Payer: Medicare Other | Source: Ambulatory Visit | Attending: Hematology and Oncology | Admitting: Hematology and Oncology

## 2020-12-23 ENCOUNTER — Encounter (HOSPITAL_COMMUNITY): Payer: Self-pay

## 2020-12-23 DIAGNOSIS — C539 Malignant neoplasm of cervix uteri, unspecified: Secondary | ICD-10-CM | POA: Diagnosis present

## 2020-12-23 MED ORDER — IOHEXOL 300 MG/ML  SOLN
75.0000 mL | Freq: Once | INTRAMUSCULAR | Status: AC | PRN
  Administered 2020-12-23: 75 mL via INTRAVENOUS

## 2020-12-24 ENCOUNTER — Inpatient Hospital Stay: Payer: Medicare Other

## 2020-12-24 ENCOUNTER — Encounter: Payer: Self-pay | Admitting: Hematology and Oncology

## 2020-12-24 ENCOUNTER — Inpatient Hospital Stay (HOSPITAL_BASED_OUTPATIENT_CLINIC_OR_DEPARTMENT_OTHER): Payer: Medicare Other | Admitting: Hematology and Oncology

## 2020-12-24 VITALS — BP 135/60 | HR 75 | Temp 98.2°F | Resp 16 | Ht 62.0 in | Wt 91.2 lb

## 2020-12-24 DIAGNOSIS — D61818 Other pancytopenia: Secondary | ICD-10-CM

## 2020-12-24 DIAGNOSIS — E538 Deficiency of other specified B group vitamins: Secondary | ICD-10-CM

## 2020-12-24 DIAGNOSIS — C539 Malignant neoplasm of cervix uteri, unspecified: Secondary | ICD-10-CM

## 2020-12-24 DIAGNOSIS — Z7189 Other specified counseling: Secondary | ICD-10-CM | POA: Diagnosis not present

## 2020-12-24 DIAGNOSIS — Z5112 Encounter for antineoplastic immunotherapy: Secondary | ICD-10-CM | POA: Diagnosis not present

## 2020-12-24 MED ORDER — CYANOCOBALAMIN 1000 MCG/ML IJ SOLN
1000.0000 ug | Freq: Once | INTRAMUSCULAR | Status: AC
  Administered 2020-12-24: 1000 ug via INTRAMUSCULAR

## 2020-12-24 MED ORDER — CYANOCOBALAMIN 1000 MCG/ML IJ SOLN
INTRAMUSCULAR | Status: AC
  Filled 2020-12-24: qty 1

## 2020-12-24 NOTE — Assessment & Plan Note (Signed)
She will receive another dose of B12 supplement today After that, I will give her B12 injection with each chemotherapy visit I plan to recheck vitamin B12 level in June

## 2020-12-24 NOTE — Progress Notes (Signed)
DISCONTINUE OFF PATHWAY REGIMEN - Other   [id number]:Carboplatin AUC=5 q21 Days:   A cycle is every 21 days:     Carboplatin   **Always confirm dose/schedule in your pharmacy ordering system**  REASON: Disease Progression PRIOR TREATMENT: Carboplatin AUC=5 q21 Days TREATMENT RESPONSE: Progressive Disease (PD)  START OFF PATHWAY REGIMEN - Other   [id number]:Pembrolizumab 200 mg IV D1 q21 Days:   A cycle is every 21 days:     Pembrolizumab   **Always confirm dose/schedule in your pharmacy ordering system**  Patient Characteristics: Intent of Therapy: Non-Curative / Palliative Intent, Discussed with Patient

## 2020-12-24 NOTE — Progress Notes (Signed)
[facility] OFFICE PROGRESS NOTE  Patient Care Team: Default, Provider, MD as PCP - General  ASSESSMENT & PLAN:  Malignant neoplasm of cervix ([place]) Unfortunately, imaging studies show slight disease progression I reviewed the guidelines with the patient She desire further palliative chemotherapy I suspect the tolerance to conventional chemotherapy would be very low given her low body weight and severe pancytopenia We discussed the risk and benefit, side effects of paclitaxel versus pembrolizumab We tested her for PD-L1 and the result came back 1% score I suspect her response rate to pembrolizumab might be low but she is willing to try We will proceed with treatment at the end of the month She is aware that treatment goal is palliative  Pancytopenia, acquired ([place]) She has chronic pancytopenia due to chemotherapy and most recently was found to have severe vitamin B12 deficiency She will continue aggressive vitamin B12 supplement and injection She is not symptomatic  Vitamin B12 deficiency She will receive another dose of B12 supplement today After that, I will give her B12 injection with each chemotherapy visit I plan to recheck vitamin B12 level in June   Orders Placed This Encounter  Procedures  . CBC with Differential (Cancer Center Only)    Standing Status:   Standing    Number of Occurrences:   20    Standing Expiration Date:   12/24/2021  . CMP ([facility] only)    Standing Status:   Standing    Number of Occurrences:   20    Standing Expiration Date:   12/24/2021  . T4    Standing Status:   Standing    Number of Occurrences:   20    Standing Expiration Date:   12/24/2021  . TSH    Standing Status:   Standing    Number of Occurrences:   20    Standing Expiration Date:   12/24/2021    All questions were answered. The patient knows to call the clinic with any problems, questions or concerns. The total time spent in the appointment was 40 minutes  encounter with patients including review of chart and various tests results, discussions about plan of care and coordination of care plan   [name], MD 12/24/2020 1:44 PM  INTERVAL HISTORY: Please see below for problem oriented charting. She returns by herself to review test results Her principal caregiver is not present with her today She denies recent vaginal bleeding or discharge She denies feeling worse or better with B12 injection  SUMMARY OF ONCOLOGIC HISTORY: Oncology History Overview Note  Cancer Staging Malignant neoplasm of cervix ([place]) Staging form: Cervix Uteri, AJCC 8th Edition - Clinical: Stage IIIB (cT3b, cN1, cM0) - Signed by [name], MD on 05/04/2018  PD-L1 1%   Malignant neoplasm of cervix ([place])  03/13/2018 Imaging   US pelvis There are mobile echogenic foci within the endometrial cavity suggestive of gas. This is nonspecific in etiology however may be secondary to endometritis. Correlate for history of recent instrumentation. This obscures visualization of the endometrial tissue and therefore a follow-up pelvic ultrasound in 2-4 weeks is recommended after resolution of the acute symptomatology if symptoms persist to further evaluate the endometrium.    03/13/2018 Initial Diagnosis   She initially presented with PMB   04/12/2018 Pathology Results   Cervix, biopsy, mass - INVASIVE SQUAMOUS CELL CARCINOMA - SEE COMMENT   04/12/2018 Surgery   PREOPERATIVE DIAGNOSIS:  Postmenopausal vaginal bleeding. POSTOPERATIVE DIAGNOSIS: The same PROCEDURE: Exam under anesthesia, pap smear, cervical  mass biopsy SURGEON:  Dr. [name]  INDICATIONS: 70 y.o. yo [id number] with PMB here for exam under anesthesia.Risks of surgery were discussed with the patient including but not limited to: bleeding which may require transfusion; infection which may require antibiotics; injury to uterus or surrounding organs; need for additional procedures including laparotomy or  laparoscopy; and other postoperative/anesthesia complications. Written informed consent was obtained.    FINDINGS:  An 8-week size midline uterus.  No adnexal mass palpable on exam. Normal cervix not visualized. Friable mass seen in involving the vagina at the level of the cervix obliterating the anterior and posterior fornix.  ANESTHESIA:   General INTRAVENOUS FLUIDS:  300 ml of LR ESTIMATED BLOOD LOSS: 20 ml. SPECIMENS: pap smear, cervical mass biopsy COMPLICATIONS:  None immediate.    04/25/2018 PET scan   1. Large cervical mass may invade into the myometrium in down into the vagina, maximum SUV 21.3. There is a malignant left external iliac node measuring 1.4 cm in short axis with maximum SUV 14.1. 2. Accentuated activity in the cecum and ascending colon is likely physiologic given that it has no CT correlate. Correlation with the patient's colon cancer screening history is recommended. If screening is not up-to-date, appropriate screening should be considered. 3.  Aortic Atherosclerosis (ICD10-I70.0).    04/28/2018 Surgery   Pre-operative Diagnosis:  At least Stage 2B SCCa Cervical Cancer  Post-operative Diagnosis: At least clinical Stage 3A SCCa Cervical Cancer  Operation:  Exam under anesthesia due to intolerance for vaginal exam in office Cystoscopy due to concern for extension to posterior bladder (from anterior vaginal wall lesion)  Operative Findings:   1. Parametrial involvement on right  2. Right sided subvaginal tumor burden ~3cm. This approximates the right ureteral insertion into the bladder based on my exam during cystoscopy. 3. Extension of disease down upper half of vagina on lateral and posterior walls. 4. Extension of disease down to lower third of anterior vagina (versus skip lesion) thus making her Stage 3A 5. Cystoscopy revealed patent bilateral ureteral orifices and no bladder mucosa invasion or areas of concern. 6. Rectal exam grossly no disease.     05/04/2018 Cancer Staging   Staging form: Cervix Uteri, AJCC 8th Edition - Clinical: Stage IIIB (cT3b, cN1, cM0) - Signed by [name], MD on 05/04/2018   05/12/2018 Procedure   Successful 8 French right internal jugular vein power port placement with its tip at the SVC/RA junction   05/13/2018 - 06/24/2018 Chemotherapy   The patient had weekly cisplatin given with concurrent radiation    05/17/2018 - 07/27/2018 Radiation Therapy   Radiation treatment dates:05/17/2018-07/27/2018  Site/dose:1. Cervix, 1.8 Gy in 25 fractions for a total dose of 45 Gy 2. Pelvis  Boost, 1.8 Gy in 5 fractions for a total dose of 9 Gy 3. Cervix,5.5Gy in 5 fractions for a total dose of 27.5Gy   06/03/2018 Adverse Reaction   She missed her chemo due to severe depression and was sent to the ER for suicidal ideation.  She was seen by psychiatrist.   10/27/2018 PET scan   IMPRESSION: 1. Complete metabolic response to therapy, without residual or recurrent hypermetabolic disease. 2.  Aortic Atherosclerosis (ICD10-I70.0).    11/17/2018 Imaging   Successful right IJ vein Port-A-Cath explant.   03/29/2020 PET scan   1. Hypermetabolic lesion along the right vaginal cuff is highly worrisome for recurrent cervical cancer. No evidence of distant metastatic disease. 2.  Aortic atherosclerosis (ICD10-I70.0).   04/26/2020 -  Chemotherapy   The  patient had CARBOplatin  for chemotherapy treatment.      Genetic Testing   Patient has genetic testing done for PD-L1. Results revealed patient has the following:  PD-L1 combined positive score (CPS): 1%   07/08/2020 Imaging   1. Unchanged post treatment appearance of the pelvis, including a soft tissue nodule in the low right hemipelvis adjacent to the vagina measuring 2.8 x 1.7 cm, previously FDG avid and consistent with malignancy. 2. No evidence of nodal or distant metastatic disease in the abdomen or pelvis. 3. Aortic  Atherosclerosis (ICD10-I70.0).   10/14/2020 Imaging   1. Unchanged right eccentric soft tissue mass in the low right hemipelvis centered on the cervix and vagina, measuring 2.9 x 1.9 cm. 2. New small volume fluid within the endometrial cavity, abnormal in the postmenopausal setting and suggesting obstruction of the cervical os by soft tissue mass. 3. No evidence of lymphadenopathy or distant metastatic disease in the abdomen or pelvis.   Aortic Atherosclerosis (ICD10-I70.0).   12/24/2020 Imaging   1. Abnormal hypodense masslike appearance continuous with hypodense masslike lesion probably centrally within a right eccentric cervix (versus right paracervical mass). The endometrial portion has enlarged compared to 10/14/2020 and active malignancy is suspected. 2. No adenopathy or findings of distant spread. 3. Other imaging findings of potential clinical significance: Aortic Atherosclerosis (ICD10-I70.0). Mild cardiomegaly. Stable scarring in the left lower lobe. Sigmoid colon scattered diverticula. Tarlov cysts at the S2 level. Vertical sclerosis in the sacral ala possibly due to stress fractures or prior radiation therapy.     01/06/2021 -  Chemotherapy    Patient is on Treatment Plan: UTERINE PEMBROLIZUMAB Q21D        REVIEW OF SYSTEMS:   Constitutional: Denies fevers, chills or abnormal weight loss Eyes: Denies blurriness of vision Ears, nose, mouth, throat, and face: Denies mucositis or sore throat Respiratory: Denies cough, dyspnea or wheezes Cardiovascular: Denies palpitation, chest discomfort or lower extremity swelling Gastrointestinal:  Denies nausea, heartburn or change in bowel habits Skin: Denies abnormal skin rashes Lymphatics: Denies new lymphadenopathy or easy bruising Neurological:Denies numbness, tingling or new weaknesses Behavioral/Psych: Mood is stable, no new changes  All other systems were reviewed with the patient and are negative.  I have reviewed the past  medical history, past surgical history, social history and family history with the patient and they are unchanged from previous note.  ALLERGIES:  has No Known Allergies.  MEDICATIONS:  Current Outpatient Medications  Medication Sig Dispense Refill  . Multiple Vitamin (MULTIVITAMIN) tablet Take 1 tablet by mouth daily.    . ondansetron (ZOFRAN) 8 MG tablet Take 1 tablet (8 mg total) by mouth every 8 (eight) hours as needed for refractory nausea / vomiting. Start on day 3 after carboplatin chemo. 30 tablet 1  . prochlorperazine (COMPAZINE) 10 MG tablet Take 1 tablet (10 mg total) by mouth every 6 (six) hours as needed (Nausea or vomiting). 30 tablet 1   No current facility-administered medications for this visit.    PHYSICAL EXAMINATION: ECOG PERFORMANCE STATUS: 1 - Symptomatic but completely ambulatory  Vitals:   12/24/20 1214  BP: 135/60  Pulse: 75  Resp: 16  Temp: 98.2 F (36.8 C)  SpO2: 99%   Filed Weights   12/24/20 1214  Weight: 91 lb 3.2 oz (41.4 kg)    GENERAL:alert, no distress and comfortable.  She looks thin and cachectic NEURO: alert & oriented x 3 with fluent speech, no focal motor/sensory deficits  LABORATORY DATA:  I have reviewed the data as  listed    Component Value Date/Time   NA 140 12/10/2020 0759   K 4.0 12/10/2020 0759   CL 105 12/10/2020 0759   CO2 23 12/10/2020 0759   GLUCOSE 90 12/10/2020 0759   BUN 18 12/10/2020 0759   CREATININE 0.80 12/10/2020 0759   CREATININE 0.83 03/11/2020 0951   CALCIUM 8.8 (L) 12/10/2020 0759   PROT 7.3 12/10/2020 0759   ALBUMIN 4.0 12/10/2020 0759   AST 16 12/10/2020 0759   AST 14 (L) 12/11/2019 1141   ALT 10 12/10/2020 0759   ALT 7 12/11/2019 1141   ALKPHOS 71 12/10/2020 0759   BILITOT 0.4 12/10/2020 0759   BILITOT 0.4 12/11/2019 1141   GFRNONAA >60 12/10/2020 0759   GFRNONAA >60 03/11/2020 0951   GFRAA >60 06/14/2020 0844   GFRAA >60 03/11/2020 0951    No results found for: SPEP, UPEP  Lab Results   Component Value Date   WBC 4.2 12/10/2020   NEUTROABS 3.0 12/10/2020   HGB 11.1 (L) 12/10/2020   HCT 33.7 (L) 12/10/2020   MCV 98.8 12/10/2020   PLT 133 (L) 12/10/2020      Chemistry      Component Value Date/Time   NA 140 12/10/2020 0759   K 4.0 12/10/2020 0759   CL 105 12/10/2020 0759   CO2 23 12/10/2020 0759   BUN 18 12/10/2020 0759   CREATININE 0.80 12/10/2020 0759   CREATININE 0.83 03/11/2020 0951      Component Value Date/Time   CALCIUM 8.8 (L) 12/10/2020 0759   ALKPHOS 71 12/10/2020 0759   AST 16 12/10/2020 0759   AST 14 (L) 12/11/2019 1141   ALT 10 12/10/2020 0759   ALT 7 12/11/2019 1141   BILITOT 0.4 12/10/2020 0759   BILITOT 0.4 12/11/2019 1141       RADIOGRAPHIC STUDIES: I have reviewed multiple CT imaging with the patient I have personally reviewed the radiological images as listed and agreed with the findings in the report. CT ABDOMEN PELVIS W CONTRAST  Result Date: 12/24/2020 CLINICAL DATA:  Cervical cancer, restaging. Chemotherapy and radiation therapy completed in October 2019. EXAM: CT ABDOMEN AND PELVIS WITH CONTRAST TECHNIQUE: Multidetector CT imaging of the abdomen and pelvis was performed using the standard protocol following bolus administration of intravenous contrast. CONTRAST:  60mL OMNIPAQUE IOHEXOL 300 MG/ML  SOLN COMPARISON:  Multiple exams, including 10/14/2020 FINDINGS: Lower chest: Descending thoracic aortic atherosclerotic calcification. Mild cardiomegaly. Stable scarring in the left lower lobe. Hepatobiliary: Unremarkable Pancreas: Unremarkable Spleen: Unremarkable Adrenals/Urinary Tract: Two small hypodense lesions in the 3-4 mm diameter range the left kidney are too small to characterize although unchanged and statistically likely to be cysts. Adrenal glands unremarkable. Stomach/Bowel: Scattered sigmoid colon diverticula. Vascular/Lymphatic: Aortoiliac atherosclerotic vascular disease. No pathologic adenopathy identified. Reproductive:  Abnormal hypodensity along the endometrium measuring 1.5 cm in thickness (formerly 1.1 cm). This appears continuous with a left eccentric hypodensity which may be in the cervical canal, measuring 1.0 cm in AP thickness. Alternatively, this could represent a mass along the right side of the cervix, and when the surrounding higher density soft tissue is included this measures 3.2 by 2.3 cm on image 65 series 2, previously 3.3 by 2.3 cm. However, I tend to favor that this is probably mass extending in the cervical canal and along the endometrium of the uterus. Adnexa unremarkable. Other: No supplemental non-categorized findings. Musculoskeletal: Tarlov cyst at the S2 level. Chronic superior endplate compression or broad Schmorl's node along the superior endplate of L5, unchanged from  prior. Sclerosis in the sacral ala not changed from prior, possibly due to prior stress fractures or prior radiation therapy. IMPRESSION: 1. Abnormal hypodense masslike appearance continuous with hypodense masslike lesion probably centrally within a right eccentric cervix (versus right paracervical mass). The endometrial portion has enlarged compared to 10/14/2020 and active malignancy is suspected. 2. No adenopathy or findings of distant spread. 3. Other imaging findings of potential clinical significance: Aortic Atherosclerosis (ICD10-I70.0). Mild cardiomegaly. Stable scarring in the left lower lobe. Sigmoid colon scattered diverticula. Tarlov cysts at the S2 level. Vertical sclerosis in the sacral ala possibly due to stress fractures or prior radiation therapy. Electronically Signed   By: Van Clines M.D.   On: 12/24/2020 10:08

## 2020-12-24 NOTE — Assessment & Plan Note (Signed)
She has chronic pancytopenia due to chemotherapy and most recently was found to have severe vitamin B12 deficiency She will continue aggressive vitamin B12 supplement and injection She is not symptomatic

## 2020-12-24 NOTE — Assessment & Plan Note (Signed)
Unfortunately, imaging studies show slight disease progression I reviewed the guidelines with the patient She desire further palliative chemotherapy I suspect the tolerance to conventional chemotherapy would be very low given her low body weight and severe pancytopenia We discussed the risk and benefit, side effects of paclitaxel versus pembrolizumab We tested her for PD-L1 and the result came back 1% score I suspect her response rate to pembrolizumab might be low but she is willing to try We will proceed with treatment at the end of the month She is aware that treatment goal is palliative

## 2020-12-30 NOTE — Progress Notes (Signed)
Pharmacist Chemotherapy Monitoring - Initial Assessment    Anticipated start date: 01/06/21   Regimen:  . Are orders appropriate based on the patient's diagnosis, regimen, and cycle? Yes . Does the plan date match the patient's scheduled date? Yes . Is the sequencing of drugs appropriate? Yes . Are the premedications appropriate for the patient's regimen? Yes . Prior Authorization for treatment is: Not Started o If applicable, is the correct biosimilar selected based on the patient's insurance? yes  Organ Function and Labs: Marland Kitchen Are dose adjustments needed based on the patient's renal function, hepatic function, or hematologic function? Yes . Are appropriate labs ordered prior to the start of patient's treatment? Yes . Other organ system assessment, if indicated: N/A . The following baseline labs, if indicated, have been ordered: pembrolizumab: baseline TSH +/- T4  Dose Assessment: . Are the drug doses appropriate? Yes . Are the following correct: o Drug concentrations Yes o IV fluid compatible with drug Yes o Administration routes Yes o Timing of therapy Yes . If applicable, does the patient have documented access for treatment and/or plans for port-a-cath placement? not applicable . If applicable, have lifetime cumulative doses been properly documented and assessed? not applicable Lifetime Dose Tracking  . Carboplatin: 2,240 mg = 0.01 % of the maximum lifetime dose of 999,999,999 mg  o   Toxicity Monitoring/Prevention: . The patient has the following take home antiemetics prescribed: Ondansetron and Prochlorperazine . The patient has the following take home medications prescribed: N/A . Medication allergies and previous infusion related reactions, if applicable, have been reviewed and addressed. No . The patient's current medication list has been assessed for drug-drug interactions with their chemotherapy regimen. no significant drug-drug interactions were identified on  review.  Order Review: . Are the treatment plan orders signed? Yes . Is the patient scheduled to see a provider prior to their treatment? Yes  I verify that I have reviewed each item in the above checklist and answered each question accordingly.  Larene Beach, RPH, 12/30/2020  1:05 PM

## 2020-12-31 ENCOUNTER — Ambulatory Visit: Payer: Medicare Other

## 2021-01-06 ENCOUNTER — Telehealth: Payer: Self-pay | Admitting: Hematology and Oncology

## 2021-01-06 ENCOUNTER — Inpatient Hospital Stay: Payer: Medicare Other

## 2021-01-06 ENCOUNTER — Encounter: Payer: Self-pay | Admitting: Hematology and Oncology

## 2021-01-06 ENCOUNTER — Other Ambulatory Visit: Payer: Self-pay

## 2021-01-06 ENCOUNTER — Inpatient Hospital Stay (HOSPITAL_BASED_OUTPATIENT_CLINIC_OR_DEPARTMENT_OTHER): Payer: Medicare Other | Admitting: Hematology and Oncology

## 2021-01-06 DIAGNOSIS — E538 Deficiency of other specified B group vitamins: Secondary | ICD-10-CM

## 2021-01-06 DIAGNOSIS — R64 Cachexia: Secondary | ICD-10-CM | POA: Diagnosis not present

## 2021-01-06 DIAGNOSIS — C539 Malignant neoplasm of cervix uteri, unspecified: Secondary | ICD-10-CM

## 2021-01-06 DIAGNOSIS — D61818 Other pancytopenia: Secondary | ICD-10-CM

## 2021-01-06 DIAGNOSIS — Z7189 Other specified counseling: Secondary | ICD-10-CM

## 2021-01-06 DIAGNOSIS — Z5112 Encounter for antineoplastic immunotherapy: Secondary | ICD-10-CM | POA: Diagnosis not present

## 2021-01-06 LAB — CBC WITH DIFFERENTIAL (CANCER CENTER ONLY)
Abs Immature Granulocytes: 0.01 10*3/uL (ref 0.00–0.07)
Basophils Absolute: 0 10*3/uL (ref 0.0–0.1)
Basophils Relative: 1 %
Eosinophils Absolute: 0 10*3/uL (ref 0.0–0.5)
Eosinophils Relative: 0 %
HCT: 34 % — ABNORMAL LOW (ref 36.0–46.0)
Hemoglobin: 11.1 g/dL — ABNORMAL LOW (ref 12.0–15.0)
Immature Granulocytes: 0 %
Lymphocytes Relative: 18 %
Lymphs Abs: 0.7 10*3/uL (ref 0.7–4.0)
MCH: 32.8 pg (ref 26.0–34.0)
MCHC: 32.6 g/dL (ref 30.0–36.0)
MCV: 100.6 fL — ABNORMAL HIGH (ref 80.0–100.0)
Monocytes Absolute: 0.4 10*3/uL (ref 0.1–1.0)
Monocytes Relative: 10 %
Neutro Abs: 2.9 10*3/uL (ref 1.7–7.7)
Neutrophils Relative %: 71 %
Platelet Count: 84 10*3/uL — ABNORMAL LOW (ref 150–400)
RBC: 3.38 MIL/uL — ABNORMAL LOW (ref 3.87–5.11)
RDW: 12.5 % (ref 11.5–15.5)
WBC Count: 4 10*3/uL (ref 4.0–10.5)
nRBC: 0 % (ref 0.0–0.2)

## 2021-01-06 LAB — CMP (CANCER CENTER ONLY)
ALT: <6 U/L (ref 0–44)
AST: 15 U/L (ref 15–41)
Albumin: 4.1 g/dL (ref 3.5–5.0)
Alkaline Phosphatase: 68 U/L (ref 38–126)
Anion gap: 12 (ref 5–15)
BUN: 12 mg/dL (ref 8–23)
CO2: 24 mmol/L (ref 22–32)
Calcium: 8.8 mg/dL — ABNORMAL LOW (ref 8.9–10.3)
Chloride: 107 mmol/L (ref 98–111)
Creatinine: 0.78 mg/dL (ref 0.44–1.00)
GFR, Estimated: >60 mL/min (ref >60)
Glucose, Bld: 91 mg/dL (ref 70–99)
Potassium: 3.5 mmol/L (ref 3.5–5.1)
Sodium: 143 mmol/L (ref 135–145)
Total Bilirubin: 0.3 mg/dL (ref 0.3–1.2)
Total Protein: 7.2 g/dL (ref 6.5–8.1)

## 2021-01-06 LAB — TSH: TSH: 1.835 u[IU]/mL (ref 0.308–3.960)

## 2021-01-06 MED ORDER — SODIUM CHLORIDE 0.9 % IV SOLN
200.0000 mg | Freq: Once | INTRAVENOUS | Status: AC
  Administered 2021-01-06: 200 mg via INTRAVENOUS
  Filled 2021-01-06: qty 8

## 2021-01-06 MED ORDER — SODIUM CHLORIDE 0.9 % IV SOLN
Freq: Once | INTRAVENOUS | Status: AC
  Filled 2021-01-06: qty 250

## 2021-01-06 MED ORDER — CYANOCOBALAMIN 1000 MCG/ML IJ SOLN
1000.0000 ug | Freq: Once | INTRAMUSCULAR | Status: AC
  Administered 2021-01-06: 1000 ug via INTRAMUSCULAR

## 2021-01-06 MED ORDER — CYANOCOBALAMIN 1000 MCG/ML IJ SOLN
INTRAMUSCULAR | Status: AC
  Filled 2021-01-06: qty 1

## 2021-01-06 NOTE — Assessment & Plan Note (Signed)
Based on previous discussion, we discussed the risk and benefits of treatment, comparing chemotherapy versus immunotherapy She would like to proceed with immunotherapy and is aware of side effects I recommend minimum 4 cycles of treatment before repeat CT imaging

## 2021-01-06 NOTE — Telephone Encounter (Signed)
Scheduled follow-up appointment per 4/25 schedule message. Patient is aware.

## 2021-01-06 NOTE — Assessment & Plan Note (Signed)
She was recently found to have severe vitamin B12 deficiency She will get B12 injection with each visit

## 2021-01-06 NOTE — Patient Instructions (Signed)
[facility]  Discharge Instructions: Thank you for choosing [facility] to provide your oncology and hematology care.   If you have a lab appointment with the [facility], please go directly to the [facility] and check in at the registration area.   Wear comfortable clothing and clothing appropriate for easy access to any Portacath or PICC line.   We strive to give you quality time with your provider. You may need to reschedule your appointment if you arrive late (15 or more minutes).  Arriving late affects you and other patients whose appointments are after yours.  Also, if you miss three or more appointments without notifying the office, you may be dismissed from the clinic at the provider's discretion.      For prescription refill requests, have your pharmacy contact our office and allow 72 hours for refills to be completed.    Today you received the following chemotherapy and/or immunotherapy agent: Pembrolizumab (Keytruda)   To help prevent nausea and vomiting after your treatment, we encourage you to take your nausea medication as directed.  BELOW ARE SYMPTOMS THAT SHOULD BE REPORTED IMMEDIATELY: . *FEVER GREATER THAN 100.4 F (38 C) OR HIGHER . *CHILLS OR SWEATING . *NAUSEA AND VOMITING THAT IS NOT CONTROLLED WITH YOUR NAUSEA MEDICATION . *UNUSUAL SHORTNESS OF BREATH . *UNUSUAL BRUISING OR BLEEDING . *URINARY PROBLEMS (pain or burning when urinating, or frequent urination) . *BOWEL PROBLEMS (unusual diarrhea, constipation, pain near the anus) . TENDERNESS IN MOUTH AND THROAT WITH OR WITHOUT PRESENCE OF ULCERS (sore throat, sores in mouth, or a toothache) . UNUSUAL RASH, SWELLING OR PAIN  . UNUSUAL VAGINAL DISCHARGE OR ITCHING   Items with * indicate a potential emergency and should be followed up as soon as possible or go to the Emergency Department if any problems should occur.  Please show the CHEMOTHERAPY ALERT CARD or  IMMUNOTHERAPY ALERT CARD at check-in to the Emergency Department and triage nurse.  Should you have questions after your visit or need to cancel or reschedule your appointment, please contact [facility]  Dept: [phone]  and follow the prompts.  Office hours are 8:00 a.m. to 4:30 p.m. Monday - Friday. Please note that voicemails left after 4:00 p.m. may not be returned until the following business day.  We are closed weekends and major holidays. You have access to a nurse at all times for urgent questions. Please call the main number to the clinic Dept: [phone] and follow the prompts.   For any non-urgent questions, you may also contact your provider using MyChart. We now offer e-Visits for anyone 44 and older to request care online for non-urgent symptoms. For details visit [email or web address].   Also download the MyChart app! Go to the app store, search "MyChart", open the app, select [facility], and log in with your MyChart username and password.  Due to Covid, a mask is required upon entering the hospital/clinic. If you do not have a mask, one will be given to you upon arrival. For doctor visits, patients may have 1 support person aged 78 or older with them. For treatment visits, patients cannot have anyone with them due to current Covid guidelines and our immunocompromised population.   Cyanocobalamin, Vitamin B12 injection What is this medicine? CYANOCOBALAMIN (sye an oh koe BAL a min) is a man made form of vitamin B12. Vitamin B12 is used in the growth of healthy blood cells, nerve cells, and proteins in the  body. It also helps with the metabolism of fats and carbohydrates. This medicine is used to treat people who can not absorb vitamin B12. This medicine may be used for other purposes; ask your health care provider or pharmacist if you have questions. COMMON BRAND NAME(S): B-12 Compliance Kit, B-12 Injection Kit, Cyomin, LA-12, Nutri-Twelve,  Physicians EZ Use B-12, Primabalt What should I tell my health care provider before I take this medicine? They need to know if you have any of these conditions:  kidney disease  Leber's disease  megaloblastic anemia  an unusual or allergic reaction to cyanocobalamin, cobalt, other medicines, foods, dyes, or preservatives  pregnant or trying to get pregnant  breast-feeding How should I use this medicine? This medicine is injected into a muscle or deeply under the skin. It is usually given by a health care professional in a clinic or doctor's office. However, your doctor may teach you how to inject yourself. Follow all instructions. Talk to your pediatrician regarding the use of this medicine in children. Special care may be needed. Overdosage: If you think you have taken too much of this medicine contact a poison control center or emergency room at once. NOTE: This medicine is only for you. Do not share this medicine with others. What if I miss a dose? If you are given your dose at a clinic or doctor's office, call to reschedule your appointment. If you give your own injections and you miss a dose, take it as soon as you can. If it is almost time for your next dose, take only that dose. Do not take double or extra doses. What may interact with this medicine?  colchicine  heavy alcohol intake This list may not describe all possible interactions. Give your health care provider a list of all the medicines, herbs, non-prescription drugs, or dietary supplements you use. Also tell them if you smoke, drink alcohol, or use illegal drugs. Some items may interact with your medicine. What should I watch for while using this medicine? Visit your doctor or health care professional regularly. You may need blood work done while you are taking this medicine. You may need to follow a special diet. Talk to your doctor. Limit your alcohol intake and avoid smoking to get the best benefit. What side effects  may I notice from receiving this medicine? Side effects that you should report to your doctor or health care professional as soon as possible:  allergic reactions like skin rash, itching or hives, swelling of the face, lips, or tongue  blue tint to skin  chest tightness, pain  difficulty breathing, wheezing  dizziness  red, swollen painful area on the leg Side effects that usually do not require medical attention (report to your doctor or health care professional if they continue or are bothersome):  diarrhea  headache This list may not describe all possible side effects. Call your doctor for medical advice about side effects. You may report side effects to FDA at 1-800-FDA-1088. Where should I keep my medicine? Keep out of the reach of children. Store at room temperature between 15 and 30 degrees C (59 and 85 degrees F). Protect from light. Throw away any unused medicine after the expiration date. NOTE: This sheet is a summary. It may not cover all possible information. If you have questions about this medicine, talk to your doctor, pharmacist, or health care provider.  2021 Elsevier/Gold Standard (2007-12-12 22:10:20)

## 2021-01-06 NOTE — Telephone Encounter (Signed)
Scheduled follow-up appointment per 4/25 schedule message. Patient is aware. 

## 2021-01-06 NOTE — Progress Notes (Signed)
Per Dr. Alvy Bimler, ok for treatment today with low platelets and pt. to have B12 injection today too.

## 2021-01-06 NOTE — Progress Notes (Signed)
[facility] OFFICE PROGRESS NOTE  Patient Care Team: Default, Provider, MD as PCP - General  ASSESSMENT & PLAN:  Malignant neoplasm of cervix (HCC) Based on previous discussion, we discussed the risk and benefits of treatment, comparing chemotherapy versus immunotherapy She would like to proceed with immunotherapy and is aware of side effects I recommend minimum 4 cycles of treatment before repeat CT imaging  Pancytopenia, acquired (HCC) She has acquired pancytopenia due to side effects of chemo and B12 deficiency We will proceed with treatment without delay  Vitamin B12 deficiency She was recently found to have severe vitamin B12 deficiency She will get B12 injection with each visit  Cachexia (HCC) For now, she is encouraged to increase oral intake as tolerated   No orders of the defined types were placed in this encounter.   All questions were answered. The patient knows to call the clinic with any problems, questions or concerns. The total time spent in the appointment was 20 minutes encounter with patients including review of chart and various tests results, discussions about plan of care and coordination of care plan    , MD 01/06/2021 12:41 PM  INTERVAL HISTORY: Please see below for problem oriented charting. She returns for treatment and follow-up She denies pain No recent vaginal discharge The patient denies any recent signs or symptoms of bleeding such as spontaneous epistaxis, hematuria or hematochezia.   SUMMARY OF ONCOLOGIC HISTORY: Oncology History Overview Note  Cancer Staging Malignant neoplasm of cervix (HCC) Staging form: Cervix Uteri, AJCC 8th Edition - Clinical: Stage IIIB (cT3b, cN1, cM0) - Signed by , , MD on 05/04/2018  PD-L1 1%   Malignant neoplasm of cervix (HCC)  03/13/2018 Imaging   US pelvis There are mobile echogenic foci within the endometrial cavity suggestive of gas. This is nonspecific in etiology  however may be secondary to endometritis. Correlate for history of recent instrumentation. This obscures visualization of the endometrial tissue and therefore a follow-up pelvic ultrasound in 2-4 weeks is recommended after resolution of the acute symptomatology if symptoms persist to further evaluate the endometrium.    03/13/2018 Initial Diagnosis   She initially presented with PMB   04/12/2018 Pathology Results   Cervix, biopsy, mass - INVASIVE SQUAMOUS CELL CARCINOMA - SEE COMMENT   04/12/2018 Surgery   PREOPERATIVE DIAGNOSIS:  Postmenopausal vaginal bleeding. POSTOPERATIVE DIAGNOSIS: The same PROCEDURE: Exam under anesthesia, pap smear, cervical mass biopsy SURGEON:  Dr. Peggy Constant  INDICATIONS: 70 y.o. yo G0P0000 with PMB here for exam under anesthesia.Risks of surgery were discussed with the patient including but not limited to: bleeding which may require transfusion; infection which may require antibiotics; injury to uterus or surrounding organs; need for additional procedures including laparotomy or laparoscopy; and other postoperative/anesthesia complications. Written informed consent was obtained.    FINDINGS:  An 8-week size midline uterus.  No adnexal mass palpable on exam. Normal cervix not visualized. Friable mass seen in involving the vagina at the level of the cervix obliterating the anterior and posterior fornix.  ANESTHESIA:   General INTRAVENOUS FLUIDS:  300 ml of LR ESTIMATED BLOOD LOSS: 20 ml. SPECIMENS: pap smear, cervical mass biopsy COMPLICATIONS:  None immediate.    04/25/2018 PET scan   1. Large cervical mass may invade into the myometrium in down into the vagina, maximum SUV 21.3. There is a malignant left external iliac node measuring 1.4 cm in short axis with maximum SUV 14.1. 2. Accentuated activity in the cecum and ascending colon is likely physiologic given   that it has no CT correlate. Correlation with the patient's colon cancer screening history is  recommended. If screening is not up-to-date, appropriate screening should be considered. 3.  Aortic Atherosclerosis (ICD10-I70.0).    04/28/2018 Surgery   Pre-operative Diagnosis:  At least Stage 2B SCCa Cervical Cancer  Post-operative Diagnosis: At least clinical Stage 3A SCCa Cervical Cancer  Operation:  Exam under anesthesia due to intolerance for vaginal exam in office Cystoscopy due to concern for extension to posterior bladder (from anterior vaginal wall lesion)  Operative Findings:   1. Parametrial involvement on right  2. Right sided subvaginal tumor burden ~3cm. This approximates the right ureteral insertion into the bladder based on my exam during cystoscopy. 3. Extension of disease down upper half of vagina on lateral and posterior walls. 4. Extension of disease down to lower third of anterior vagina (versus skip lesion) thus making her Stage 3A 5. Cystoscopy revealed patent bilateral ureteral orifices and no bladder mucosa invasion or areas of concern. 6. Rectal exam grossly no disease.    05/04/2018 Cancer Staging   Staging form: Cervix Uteri, AJCC 8th Edition - Clinical: Stage IIIB (cT3b, cN1, cM0) - Signed by [name], MD on 05/04/2018   05/12/2018 Procedure   Successful 8 French right internal jugular vein power port placement with its tip at the SVC/RA junction   05/13/2018 - 06/24/2018 Chemotherapy   The patient had weekly cisplatin given with concurrent radiation    05/17/2018 - 07/27/2018 Radiation Therapy   Radiation treatment dates:05/17/2018-07/27/2018  Site/dose:1. Cervix, 1.8 Gy in 25 fractions for a total dose of 45 Gy 2. Pelvis  Boost, 1.8 Gy in 5 fractions for a total dose of 9 Gy 3. Cervix,5.5Gy in 5 fractions for a total dose of 27.5Gy   06/03/2018 Adverse Reaction   She missed her chemo due to severe depression and was sent to the ER for suicidal ideation.  She was seen by psychiatrist.   10/27/2018  PET scan   IMPRESSION: 1. Complete metabolic response to therapy, without residual or recurrent hypermetabolic disease. 2.  Aortic Atherosclerosis (ICD10-I70.0).    11/17/2018 Imaging   Successful right IJ vein Port-A-Cath explant.   03/29/2020 PET scan   1. Hypermetabolic lesion along the right vaginal cuff is highly worrisome for recurrent cervical cancer. No evidence of distant metastatic disease. 2.  Aortic atherosclerosis (ICD10-I70.0).   04/26/2020 -  Chemotherapy   The patient had CARBOplatin  for chemotherapy treatment.      Genetic Testing   Patient has genetic testing done for PD-L1. Results revealed patient has the following:  PD-L1 combined positive score (CPS): 1%   07/08/2020 Imaging   1. Unchanged post treatment appearance of the pelvis, including a soft tissue nodule in the low right hemipelvis adjacent to the vagina measuring 2.8 x 1.7 cm, previously FDG avid and consistent with malignancy. 2. No evidence of nodal or distant metastatic disease in the abdomen or pelvis. 3. Aortic Atherosclerosis (ICD10-I70.0).   10/14/2020 Imaging   1. Unchanged right eccentric soft tissue mass in the low right hemipelvis centered on the cervix and vagina, measuring 2.9 x 1.9 cm. 2. New small volume fluid within the endometrial cavity, abnormal in the postmenopausal setting and suggesting obstruction of the cervical os by soft tissue mass. 3. No evidence of lymphadenopathy or distant metastatic disease in the abdomen or pelvis.   Aortic Atherosclerosis (ICD10-I70.0).   12/24/2020 Imaging   1. Abnormal hypodense masslike appearance continuous with hypodense masslike lesion probably centrally within a right  eccentric cervix (versus right paracervical mass). The endometrial portion has enlarged compared to 10/14/2020 and active malignancy is suspected. 2. No adenopathy or findings of distant spread. 3. Other imaging findings of potential clinical significance: Aortic Atherosclerosis  (ICD10-I70.0). Mild cardiomegaly. Stable scarring in the left lower lobe. Sigmoid colon scattered diverticula. Tarlov cysts at the S2 level. Vertical sclerosis in the sacral ala possibly due to stress fractures or prior radiation therapy.     01/06/2021 -  Chemotherapy    Patient is on Treatment Plan: UTERINE PEMBROLIZUMAB Q21D        REVIEW OF SYSTEMS:   Constitutional: Denies fevers, chills or abnormal weight loss Eyes: Denies blurriness of vision Ears, nose, mouth, throat, and face: Denies mucositis or sore throat Respiratory: Denies cough, dyspnea or wheezes Cardiovascular: Denies palpitation, chest discomfort or lower extremity swelling Gastrointestinal:  Denies nausea, heartburn or change in bowel habits Skin: Denies abnormal skin rashes Lymphatics: Denies new lymphadenopathy or easy bruising Neurological:Denies numbness, tingling or new weaknesses Behavioral/Psych: Mood is stable, no new changes  All other systems were reviewed with the patient and are negative.  I have reviewed the past medical history, past surgical history, social history and family history with the patient and they are unchanged from previous note.  ALLERGIES:  has No Known Allergies.  MEDICATIONS:  Current Outpatient Medications  Medication Sig Dispense Refill  . Multiple Vitamin (MULTIVITAMIN) tablet Take 1 tablet by mouth daily.    . ondansetron (ZOFRAN) 8 MG tablet Take 1 tablet (8 mg total) by mouth every 8 (eight) hours as needed for refractory nausea / vomiting. Start on day 3 after carboplatin chemo. 30 tablet 1  . prochlorperazine (COMPAZINE) 10 MG tablet Take 1 tablet (10 mg total) by mouth every 6 (six) hours as needed (Nausea or vomiting). 30 tablet 1   No current facility-administered medications for this visit.    PHYSICAL EXAMINATION: ECOG PERFORMANCE STATUS: 0 - Asymptomatic  Vitals:   01/06/21 0843  BP: (!) 146/66  Pulse: 89  Resp: 18  Temp: 99.1 F (37.3 C)  SpO2: 98%    Filed Weights   01/06/21 0843  Weight: 87 lb 12.8 oz (39.8 kg)    GENERAL:alert, no distress and comfortable Musculoskeletal:no cyanosis of digits and no clubbing  NEURO: alert & oriented x 3 with fluent speech, no focal motor/sensory deficits  LABORATORY DATA:  I have reviewed the data as listed    Component Value Date/Time   NA 143 01/06/2021 0813   K 3.5 01/06/2021 0813   CL 107 01/06/2021 0813   CO2 24 01/06/2021 0813   GLUCOSE 91 01/06/2021 0813   BUN 12 01/06/2021 0813   CREATININE 0.78 01/06/2021 0813   CALCIUM 8.8 (L) 01/06/2021 0813   PROT 7.2 01/06/2021 0813   ALBUMIN 4.1 01/06/2021 0813   AST 15 01/06/2021 0813   ALT <6 01/06/2021 0813   ALKPHOS 68 01/06/2021 0813   BILITOT 0.3 01/06/2021 0813   GFRNONAA >60 01/06/2021 0813   GFRAA >60 06/14/2020 0844   GFRAA >60 03/11/2020 0951    No results found for: SPEP, UPEP  Lab Results  Component Value Date   WBC 4.0 01/06/2021   NEUTROABS 2.9 01/06/2021   HGB 11.1 (L) 01/06/2021   HCT 34.0 (L) 01/06/2021   MCV 100.6 (H) 01/06/2021   PLT 84 (L) 01/06/2021      Chemistry      Component Value Date/Time   NA 143 01/06/2021 0813   K 3.5 01/06/2021 0813  CL 107 01/06/2021 0813   CO2 24 01/06/2021 0813   BUN 12 01/06/2021 0813   CREATININE 0.78 01/06/2021 0813      Component Value Date/Time   CALCIUM 8.8 (L) 01/06/2021 0813   ALKPHOS 68 01/06/2021 0813   AST 15 01/06/2021 0813   ALT <6 01/06/2021 0813   BILITOT 0.3 01/06/2021 0813

## 2021-01-06 NOTE — Assessment & Plan Note (Signed)
She has acquired pancytopenia due to side effects of chemo and B12 deficiency We will proceed with treatment without delay

## 2021-01-06 NOTE — Assessment & Plan Note (Signed)
For now, she is encouraged to increase oral intake as tolerated

## 2021-01-07 ENCOUNTER — Telehealth: Payer: Self-pay | Admitting: *Deleted

## 2021-01-07 LAB — T4: T4, Total: 8.6 ug/dL (ref 4.5–12.0)

## 2021-01-30 ENCOUNTER — Inpatient Hospital Stay: Payer: Medicare Other

## 2021-01-30 ENCOUNTER — Encounter: Payer: Self-pay | Admitting: Hematology and Oncology

## 2021-01-30 ENCOUNTER — Inpatient Hospital Stay (HOSPITAL_BASED_OUTPATIENT_CLINIC_OR_DEPARTMENT_OTHER): Payer: Medicare Other | Admitting: Hematology and Oncology

## 2021-01-30 ENCOUNTER — Other Ambulatory Visit: Payer: Self-pay | Admitting: Hematology and Oncology

## 2021-01-30 ENCOUNTER — Inpatient Hospital Stay: Payer: Medicare Other | Attending: Hematology and Oncology

## 2021-01-30 ENCOUNTER — Other Ambulatory Visit: Payer: Self-pay

## 2021-01-30 DIAGNOSIS — D61818 Other pancytopenia: Secondary | ICD-10-CM

## 2021-01-30 DIAGNOSIS — E538 Deficiency of other specified B group vitamins: Secondary | ICD-10-CM

## 2021-01-30 DIAGNOSIS — Z7189 Other specified counseling: Secondary | ICD-10-CM

## 2021-01-30 DIAGNOSIS — Z923 Personal history of irradiation: Secondary | ICD-10-CM | POA: Diagnosis not present

## 2021-01-30 DIAGNOSIS — Z9221 Personal history of antineoplastic chemotherapy: Secondary | ICD-10-CM | POA: Diagnosis not present

## 2021-01-30 DIAGNOSIS — C539 Malignant neoplasm of cervix uteri, unspecified: Secondary | ICD-10-CM

## 2021-01-30 DIAGNOSIS — I700 Atherosclerosis of aorta: Secondary | ICD-10-CM | POA: Insufficient documentation

## 2021-01-30 DIAGNOSIS — Z79899 Other long term (current) drug therapy: Secondary | ICD-10-CM | POA: Diagnosis not present

## 2021-01-30 DIAGNOSIS — Z5112 Encounter for antineoplastic immunotherapy: Secondary | ICD-10-CM | POA: Diagnosis not present

## 2021-01-30 LAB — CMP (CANCER CENTER ONLY)
ALT: <6 U/L (ref 0–44)
AST: 16 U/L (ref 15–41)
Albumin: 3.9 g/dL (ref 3.5–5.0)
Alkaline Phosphatase: 70 U/L (ref 38–126)
Anion gap: 11 (ref 5–15)
BUN: 16 mg/dL (ref 8–23)
CO2: 24 mmol/L (ref 22–32)
Calcium: 8.9 mg/dL (ref 8.9–10.3)
Chloride: 106 mmol/L (ref 98–111)
Creatinine: 0.79 mg/dL (ref 0.44–1.00)
GFR, Estimated: >60 mL/min (ref >60)
Glucose, Bld: 89 mg/dL (ref 70–99)
Potassium: 3.7 mmol/L (ref 3.5–5.1)
Sodium: 141 mmol/L (ref 135–145)
Total Bilirubin: 0.4 mg/dL (ref 0.3–1.2)
Total Protein: 7.1 g/dL (ref 6.5–8.1)

## 2021-01-30 LAB — CBC WITH DIFFERENTIAL (CANCER CENTER ONLY)
Abs Immature Granulocytes: 0.03 K/uL (ref 0.00–0.07)
Basophils Absolute: 0 K/uL (ref 0.0–0.1)
Basophils Relative: 1 %
Eosinophils Absolute: 0 K/uL (ref 0.0–0.5)
Eosinophils Relative: 1 %
HCT: 31.7 % — ABNORMAL LOW (ref 36.0–46.0)
Hemoglobin: 10.9 g/dL — ABNORMAL LOW (ref 12.0–15.0)
Immature Granulocytes: 1 %
Lymphocytes Relative: 23 %
Lymphs Abs: 0.8 K/uL (ref 0.7–4.0)
MCH: 33.5 pg (ref 26.0–34.0)
MCHC: 34.4 g/dL (ref 30.0–36.0)
MCV: 97.5 fL (ref 80.0–100.0)
Monocytes Absolute: 0.6 K/uL (ref 0.1–1.0)
Monocytes Relative: 17 %
Neutro Abs: 2.1 K/uL (ref 1.7–7.7)
Neutrophils Relative %: 57 %
Platelet Count: 174 K/uL (ref 150–400)
RBC: 3.25 MIL/uL — ABNORMAL LOW (ref 3.87–5.11)
RDW: 11.6 % (ref 11.5–15.5)
WBC Count: 3.5 K/uL — ABNORMAL LOW (ref 4.0–10.5)
nRBC: 0 % (ref 0.0–0.2)

## 2021-01-30 LAB — TSH: TSH: 2.092 u[IU]/mL (ref 0.308–3.960)

## 2021-01-30 MED ORDER — DIPHENHYDRAMINE HCL 12.5 MG/5ML PO ELIX
ORAL_SOLUTION | ORAL | Status: AC
  Filled 2021-01-30: qty 5

## 2021-01-30 MED ORDER — SODIUM CHLORIDE 0.9 % IV SOLN
Freq: Once | INTRAVENOUS | Status: AC
  Filled 2021-01-30: qty 250

## 2021-01-30 MED ORDER — CYANOCOBALAMIN 1000 MCG/ML IJ SOLN
1000.0000 ug | Freq: Once | INTRAMUSCULAR | Status: AC
  Administered 2021-01-30: 1000 ug via INTRAMUSCULAR

## 2021-01-30 MED ORDER — DIPHENHYDRAMINE HCL 12.5 MG/5ML PO ELIX
12.5000 mg | ORAL_SOLUTION | Freq: Once | ORAL | Status: AC
  Administered 2021-01-30: 12.5 mg via ORAL

## 2021-01-30 MED ORDER — SODIUM CHLORIDE 0.9 % IV SOLN
200.0000 mg | Freq: Once | INTRAVENOUS | Status: AC
  Administered 2021-01-30: 200 mg via INTRAVENOUS
  Filled 2021-01-30: qty 8

## 2021-01-30 NOTE — Assessment & Plan Note (Signed)
The cause of her pancytopenia was multifactorial, related to prior chemotherapy and severe vitamin B12 deficiency Since discontinuation of treatment and aggressive vitamin B12 supplementation, her pancytopenia is improving Thrombocytopenia has resolved She will continue vitamin B12 injection She is not symptomatic from pancytopenia

## 2021-01-30 NOTE — Patient Instructions (Signed)
[facility]ICAL [facility]COLOGY  Discharge Instructions: Thank you for choosing [facility] to provide your oncology and hematology care.   If you have a lab appointment with the Cancer Center, please go directly to the Cancer Center and check in at the registration area.   Wear comfortable clothing and clothing appropriate for easy access to any Portacath or PICC line.   We strive to give you quality time with your provider. You may need to reschedule your appointment if you arrive late (15 or more minutes).  Arriving late affects you and other patients whose appointments are after yours.  Also, if you miss three or more appointments without notifying the office, you may be dismissed from the clinic at the provider's discretion.      For prescription refill requests, have your pharmacy contact our office and allow 72 hours for refills to be completed.    Today you received the following chemotherapy and/or immunotherapy agent: Pembrolizumab (Keytruda)   To help prevent nausea and vomiting after your treatment, we encourage you to take your nausea medication as directed.  BELOW ARE SYMPTOMS THAT SHOULD BE REPORTED IMMEDIATELY: . *FEVER GREATER THAN 100.4 F (38 C) OR HIGHER . *CHILLS OR SWEATING . *NAUSEA AND VOMITING THAT IS NOT CONTROLLED WITH YOUR NAUSEA MEDICATION . *UNUSUAL SHORTNESS OF BREATH . *UNUSUAL BRUISING OR BLEEDING . *URINARY PROBLEMS (pain or burning when urinating, or frequent urination) . *BOWEL PROBLEMS (unusual diarrhea, constipation, pain near the anus) . TENDERNESS IN MOUTH AND THROAT WITH OR WITHOUT PRESENCE OF ULCERS (sore throat, sores in mouth, or a toothache) . UNUSUAL RASH, SWELLING OR PAIN  . UNUSUAL VAGINAL DISCHARGE OR ITCHING   Items with * indicate a potential emergency and should be followed up as soon as possible or go to the Emergency Department if any problems should occur.  Please show the CHEMOTHERAPY ALERT CARD or  IMMUNOTHERAPY ALERT CARD at check-in to the Emergency Department and triage nurse.  Should you have questions after your visit or need to cancel or reschedule your appointment, please contact [facility]  Dept: [phone]  and follow the prompts.  Office hours are 8:00 a.m. to 4:30 p.m. Monday - Friday. Please note that voicemails left after 4:00 p.m. may not be returned until the following business day.  We are closed weekends and major holidays. You have access to a nurse at all times for urgent questions. Please call the main number to the clinic Dept: [phone] and follow the prompts.   For any non-urgent questions, you may also contact your provider using MyChart. We now offer e-Visits for anyone 18 and older to request care online for non-urgent symptoms. For details visit [email or web address].   Also download the MyChart app! Go to the app store, search "MyChart", open the app, select [facility], and log in with your MyChart username and password.  Due to Covid, a mask is required upon entering the hospital/clinic. If you do not have a mask, one will be given to you upon arrival. For doctor visits, patients may have 1 support person aged 18 or older with them. For treatment visits, patients cannot have anyone with them due to current Covid guidelines and our immunocompromised population.   Cyanocobalamin, Vitamin B12 injection What is this medicine? CYANOCOBALAMIN (sye an oh koe BAL a min) is a man made form of vitamin B12. Vitamin B12 is used in the growth of healthy blood cells, nerve cells, and proteins in the   body. It also helps with the metabolism of fats and carbohydrates. This medicine is used to treat people who can not absorb vitamin B12. This medicine may be used for other purposes; ask your health care provider or pharmacist if you have questions. COMMON BRAND NAME(S): B-12 Compliance Kit, B-12 Injection Kit, Cyomin, LA-12, Nutri-Twelve,  Physicians EZ Use B-12, Primabalt What should I tell my health care provider before I take this medicine? They need to know if you have any of these conditions:  kidney disease  Leber's disease  megaloblastic anemia  an unusual or allergic reaction to cyanocobalamin, cobalt, other medicines, foods, dyes, or preservatives  pregnant or trying to get pregnant  breast-feeding How should I use this medicine? This medicine is injected into a muscle or deeply under the skin. It is usually given by a health care professional in a clinic or doctor's office. However, your doctor may teach you how to inject yourself. Follow all instructions. Talk to your pediatrician regarding the use of this medicine in children. Special care may be needed. Overdosage: If you think you have taken too much of this medicine contact a poison control center or emergency room at once. NOTE: This medicine is only for you. Do not share this medicine with others. What if I miss a dose? If you are given your dose at a clinic or doctor's office, call to reschedule your appointment. If you give your own injections and you miss a dose, take it as soon as you can. If it is almost time for your next dose, take only that dose. Do not take double or extra doses. What may interact with this medicine?  colchicine  heavy alcohol intake This list may not describe all possible interactions. Give your health care provider a list of all the medicines, herbs, non-prescription drugs, or dietary supplements you use. Also tell them if you smoke, drink alcohol, or use illegal drugs. Some items may interact with your medicine. What should I watch for while using this medicine? Visit your doctor or health care professional regularly. You may need blood work done while you are taking this medicine. You may need to follow a special diet. Talk to your doctor. Limit your alcohol intake and avoid smoking to get the best benefit. What side effects  may I notice from receiving this medicine? Side effects that you should report to your doctor or health care professional as soon as possible:  allergic reactions like skin rash, itching or hives, swelling of the face, lips, or tongue  blue tint to skin  chest tightness, pain  difficulty breathing, wheezing  dizziness  red, swollen painful area on the leg Side effects that usually do not require medical attention (report to your doctor or health care professional if they continue or are bothersome):  diarrhea  headache This list may not describe all possible side effects. Call your doctor for medical advice about side effects. You may report side effects to FDA at 1-800-FDA-1088. Where should I keep my medicine? Keep out of the reach of children. Store at room temperature between 15 and 30 degrees C (59 and 85 degrees F). Protect from light. Throw away any unused medicine after the expiration date. NOTE: This sheet is a summary. It may not cover all possible information. If you have questions about this medicine, talk to your doctor, pharmacist, or health care provider.  2021 Elsevier/Gold Standard (2007-12-12 22:10:20)   

## 2021-01-30 NOTE — Assessment & Plan Note (Signed)
She will receive vitamin B12 injection with every visit.  Pembrolizumab is scheduled every 3 weeks and hence she will get B12 injection every 3 weeks

## 2021-01-30 NOTE — Assessment & Plan Note (Signed)
She tolerated pembrolizumab very well with no side effects We will proceed with treatment as scheduled I recommend minimum 4 cycles of treatment before repeat CT imaging

## 2021-01-30 NOTE — Progress Notes (Signed)
[facility] OFFICE PROGRESS NOTE  Patient Care Team: Default, Provider, MD as PCP - General  ASSESSMENT & PLAN:  Malignant neoplasm of cervix ([place]) She tolerated pembrolizumab very well with no side effects We will proceed with treatment as scheduled I recommend minimum 4 cycles of treatment before repeat CT imaging  Vitamin B12 deficiency She will receive vitamin B12 injection with every visit.  Pembrolizumab is scheduled every 3 weeks and hence she will get B12 injection every 3 weeks  Pancytopenia, acquired ([place]) The cause of her pancytopenia was multifactorial, related to prior chemotherapy and severe vitamin B12 deficiency Since discontinuation of treatment and aggressive vitamin B12 supplementation, her pancytopenia is improving Thrombocytopenia has resolved She will continue vitamin B12 injection She is not symptomatic from pancytopenia   No orders of the defined types were placed in this encounter.   All questions were answered. The patient knows to call the clinic with any problems, questions or concerns. The total time spent in the appointment was 20 minutes encounter with patients including review of chart and various tests results, discussions about plan of care and coordination of care plan   [name], MD 01/30/2021 12:01 PM  INTERVAL HISTORY: Please see below for problem oriented charting. She returns for further follow-up She tolerated cycle 1 of treatment very well without any side effects Denies any pelvic pain or vaginal discharge The patient denies any recent signs or symptoms of bleeding such as spontaneous epistaxis, hematuria or hematochezia.  SUMMARY OF ONCOLOGIC HISTORY: Oncology History Overview Note  Cancer Staging Malignant neoplasm of cervix ([place]) Staging form: Cervix Uteri, AJCC 8th Edition - Clinical: Stage IIIB (cT3b, cN1, cM0) - Signed by [name], MD on 05/04/2018  PD-L1 1%   Malignant neoplasm of cervix ([place])   03/13/2018 Imaging   US pelvis There are mobile echogenic foci within the endometrial cavity suggestive of gas. This is nonspecific in etiology however may be secondary to endometritis. Correlate for history of recent instrumentation. This obscures visualization of the endometrial tissue and therefore a follow-up pelvic ultrasound in 2-4 weeks is recommended after resolution of the acute symptomatology if symptoms persist to further evaluate the endometrium.    03/13/2018 Initial Diagnosis   She initially presented with PMB   04/12/2018 Pathology Results   Cervix, biopsy, mass - INVASIVE SQUAMOUS CELL CARCINOMA - SEE COMMENT   04/12/2018 Surgery   PREOPERATIVE DIAGNOSIS:  Postmenopausal vaginal bleeding. POSTOPERATIVE DIAGNOSIS: The same PROCEDURE: Exam under anesthesia, pap smear, cervical mass biopsy SURGEON:  Dr. [name]  INDICATIONS: 70 y.o. yo [id number] with PMB here for exam under anesthesia.Risks of surgery were discussed with the patient including but not limited to: bleeding which may require transfusion; infection which may require antibiotics; injury to uterus or surrounding organs; need for additional procedures including laparotomy or laparoscopy; and other postoperative/anesthesia complications. Written informed consent was obtained.    FINDINGS:  An 8-week size midline uterus.  No adnexal mass palpable on exam. Normal cervix not visualized. Friable mass seen in involving the vagina at the level of the cervix obliterating the anterior and posterior fornix.  ANESTHESIA:   General INTRAVENOUS FLUIDS:  300 ml of LR ESTIMATED BLOOD LOSS: 20 ml. SPECIMENS: pap smear, cervical mass biopsy COMPLICATIONS:  None immediate.    04/25/2018 PET scan   1. Large cervical mass may invade into the myometrium in down into the vagina, maximum SUV 21.3. There is a malignant left external iliac node measuring 1.4 cm in short axis with maximum  SUV 14.1. 2. Accentuated activity in the  cecum and ascending colon is likely physiologic given that it has no CT correlate. Correlation with the patient's colon cancer screening history is recommended. If screening is not up-to-date, appropriate screening should be considered. 3.  Aortic Atherosclerosis (ICD10-I70.0).    04/28/2018 Surgery   Pre-operative Diagnosis:  At least Stage 2B SCCa Cervical Cancer  Post-operative Diagnosis: At least clinical Stage 3A SCCa Cervical Cancer  Operation:  Exam under anesthesia due to intolerance for vaginal exam in office Cystoscopy due to concern for extension to posterior bladder (from anterior vaginal wall lesion)  Operative Findings:   1. Parametrial involvement on right  2. Right sided subvaginal tumor burden ~3cm. This approximates the right ureteral insertion into the bladder based on my exam during cystoscopy. 3. Extension of disease down upper half of vagina on lateral and posterior walls. 4. Extension of disease down to lower third of anterior vagina (versus skip lesion) thus making her Stage 3A 5. Cystoscopy revealed patent bilateral ureteral orifices and no bladder mucosa invasion or areas of concern. 6. Rectal exam grossly no disease.    05/04/2018 Cancer Staging   Staging form: Cervix Uteri, AJCC 8th Edition - Clinical: Stage IIIB (cT3b, cN1, cM0) - Signed by Artis Delay, MD on 05/04/2018   05/12/2018 Procedure   Successful 8 French right internal jugular vein power port placement with its tip at the SVC/RA junction   05/13/2018 - 06/24/2018 Chemotherapy   The patient had weekly cisplatin given with concurrent radiation    05/17/2018 - 07/27/2018 Radiation Therapy   Radiation treatment dates:05/17/2018-07/27/2018  Site/dose:1. Cervix, 1.8 Gy in 25 fractions for a total dose of 45 Gy 2. Pelvis  Boost, 1.8 Gy in 5 fractions for a total dose of 9 Gy 3. Cervix,5.5Gy in 5 fractions for a total dose of 27.5Gy   06/03/2018 Adverse  Reaction   She missed her chemo due to severe depression and was sent to the ER for suicidal ideation.  She was seen by psychiatrist.   10/27/2018 PET scan   IMPRESSION: 1. Complete metabolic response to therapy, without residual or recurrent hypermetabolic disease. 2.  Aortic Atherosclerosis (ICD10-I70.0).    11/17/2018 Imaging   Successful right IJ vein Port-A-Cath explant.   03/29/2020 PET scan   1. Hypermetabolic lesion along the right vaginal cuff is highly worrisome for recurrent cervical cancer. No evidence of distant metastatic disease. 2.  Aortic atherosclerosis (ICD10-I70.0).   04/26/2020 -  Chemotherapy   The patient had CARBOplatin  for chemotherapy treatment.      Genetic Testing   Patient has genetic testing done for PD-L1. Results revealed patient has the following:  PD-L1 combined positive score (CPS): 1%   07/08/2020 Imaging   1. Unchanged post treatment appearance of the pelvis, including a soft tissue nodule in the low right hemipelvis adjacent to the vagina measuring 2.8 x 1.7 cm, previously FDG avid and consistent with malignancy. 2. No evidence of nodal or distant metastatic disease in the abdomen or pelvis. 3. Aortic Atherosclerosis (ICD10-I70.0).   10/14/2020 Imaging   1. Unchanged right eccentric soft tissue mass in the low right hemipelvis centered on the cervix and vagina, measuring 2.9 x 1.9 cm. 2. New small volume fluid within the endometrial cavity, abnormal in the postmenopausal setting and suggesting obstruction of the cervical os by soft tissue mass. 3. No evidence of lymphadenopathy or distant metastatic disease in the abdomen or pelvis.   Aortic Atherosclerosis (ICD10-I70.0).   12/24/2020 Imaging  1. Abnormal hypodense masslike appearance continuous with hypodense masslike lesion probably centrally within a right eccentric cervix (versus right paracervical mass). The endometrial portion has enlarged compared to 10/14/2020 and active malignancy is  suspected. 2. No adenopathy or findings of distant spread. 3. Other imaging findings of potential clinical significance: Aortic Atherosclerosis (ICD10-I70.0). Mild cardiomegaly. Stable scarring in the left lower lobe. Sigmoid colon scattered diverticula. Tarlov cysts at the S2 level. Vertical sclerosis in the sacral ala possibly due to stress fractures or prior radiation therapy.     01/06/2021 -  Chemotherapy    Patient is on Treatment Plan: UTERINE PEMBROLIZUMAB Q21D        REVIEW OF SYSTEMS:   Constitutional: Denies fevers, chills or abnormal weight loss Eyes: Denies blurriness of vision Ears, nose, mouth, throat, and face: Denies mucositis or sore throat Respiratory: Denies cough, dyspnea or wheezes Cardiovascular: Denies palpitation, chest discomfort or lower extremity swelling Gastrointestinal:  Denies nausea, heartburn or change in bowel habits Skin: Denies abnormal skin rashes Lymphatics: Denies new lymphadenopathy or easy bruising Neurological:Denies numbness, tingling or new weaknesses Behavioral/Psych: Mood is stable, no new changes  All other systems were reviewed with the patient and are negative.  I have reviewed the past medical history, past surgical history, social history and family history with the patient and they are unchanged from previous note.  ALLERGIES:  has No Known Allergies.  MEDICATIONS:  Current Outpatient Medications  Medication Sig Dispense Refill  . Multiple Vitamin (MULTIVITAMIN) tablet Take 1 tablet by mouth daily.    . ondansetron (ZOFRAN) 8 MG tablet Take 1 tablet (8 mg total) by mouth every 8 (eight) hours as needed for refractory nausea / vomiting. Start on day 3 after carboplatin chemo. 30 tablet 1  . prochlorperazine (COMPAZINE) 10 MG tablet Take 1 tablet (10 mg total) by mouth every 6 (six) hours as needed (Nausea or vomiting). 30 tablet 1   No current facility-administered medications for this visit.    PHYSICAL EXAMINATION: ECOG  PERFORMANCE STATUS: 0 - Asymptomatic  Vitals:   01/30/21 1149  BP: 120/90  Pulse: 76  Resp: 15  Temp: 97.6 F (36.4 C)  SpO2: 95%   Filed Weights   01/30/21 1149  Weight: 89 lb 3.2 oz (40.5 kg)    GENERAL:alert, no distress and comfortable SKIN: skin color, texture, turgor are normal, no rashes or significant lesions EYES: normal, Conjunctiva are pink and non-injected, sclera clear OROPHARYNX:no exudate, no erythema and lips, buccal mucosa, and tongue normal  NECK: supple, thyroid normal size, non-tender, without nodularity LYMPH:  no palpable lymphadenopathy in the cervical, axillary or inguinal LUNGS: clear to auscultation and percussion with normal breathing effort HEART: regular rate & rhythm and no murmurs and no lower extremity edema ABDOMEN:abdomen soft, non-tender and normal bowel sounds Musculoskeletal:no cyanosis of digits and no clubbing  NEURO: alert & oriented x 3 with fluent speech, no focal motor/sensory deficits  LABORATORY DATA:  I have reviewed the data as listed    Component Value Date/Time   NA 143 01/06/2021 0813   K 3.5 01/06/2021 0813   CL 107 01/06/2021 0813   CO2 24 01/06/2021 0813   GLUCOSE 91 01/06/2021 0813   BUN 12 01/06/2021 0813   CREATININE 0.78 01/06/2021 0813   CALCIUM 8.8 (L) 01/06/2021 0813   PROT 7.2 01/06/2021 0813   ALBUMIN 4.1 01/06/2021 0813   AST 15 01/06/2021 0813   ALT <6 01/06/2021 0813   ALKPHOS 68 01/06/2021 0813   BILITOT 0.3 01/06/2021 0813  GFRNONAA >60 01/06/2021 0813   GFRAA >60 06/14/2020 0844   GFRAA >60 03/11/2020 0951    No results found for: SPEP, UPEP  Lab Results  Component Value Date   WBC 3.5 (L) 01/30/2021   NEUTROABS 2.1 01/30/2021   HGB 10.9 (L) 01/30/2021   HCT 31.7 (L) 01/30/2021   MCV 97.5 01/30/2021   PLT 174 01/30/2021      Chemistry      Component Value Date/Time   NA 143 01/06/2021 0813   K 3.5 01/06/2021 0813   CL 107 01/06/2021 0813   CO2 24 01/06/2021 0813   BUN 12  01/06/2021 0813   CREATININE 0.78 01/06/2021 0813      Component Value Date/Time   CALCIUM 8.8 (L) 01/06/2021 0813   ALKPHOS 68 01/06/2021 0813   AST 15 01/06/2021 0813   ALT <6 01/06/2021 0813   BILITOT 0.3 01/06/2021 0813

## 2021-01-31 LAB — T4: T4, Total: 8.6 ug/dL (ref 4.5–12.0)

## 2021-02-21 ENCOUNTER — Inpatient Hospital Stay: Payer: Medicare Other | Attending: Hematology and Oncology

## 2021-02-21 ENCOUNTER — Inpatient Hospital Stay: Payer: Medicare Other

## 2021-02-21 ENCOUNTER — Encounter: Payer: Self-pay | Admitting: Hematology and Oncology

## 2021-02-21 ENCOUNTER — Other Ambulatory Visit: Payer: Self-pay

## 2021-02-21 ENCOUNTER — Inpatient Hospital Stay (HOSPITAL_BASED_OUTPATIENT_CLINIC_OR_DEPARTMENT_OTHER): Payer: Medicare Other | Admitting: Hematology and Oncology

## 2021-02-21 DIAGNOSIS — C539 Malignant neoplasm of cervix uteri, unspecified: Secondary | ICD-10-CM

## 2021-02-21 DIAGNOSIS — Z923 Personal history of irradiation: Secondary | ICD-10-CM | POA: Diagnosis not present

## 2021-02-21 DIAGNOSIS — E538 Deficiency of other specified B group vitamins: Secondary | ICD-10-CM | POA: Insufficient documentation

## 2021-02-21 DIAGNOSIS — K5730 Diverticulosis of large intestine without perforation or abscess without bleeding: Secondary | ICD-10-CM | POA: Insufficient documentation

## 2021-02-21 DIAGNOSIS — I700 Atherosclerosis of aorta: Secondary | ICD-10-CM | POA: Insufficient documentation

## 2021-02-21 DIAGNOSIS — Z5112 Encounter for antineoplastic immunotherapy: Secondary | ICD-10-CM | POA: Diagnosis present

## 2021-02-21 DIAGNOSIS — Z79899 Other long term (current) drug therapy: Secondary | ICD-10-CM | POA: Diagnosis not present

## 2021-02-21 DIAGNOSIS — Z9221 Personal history of antineoplastic chemotherapy: Secondary | ICD-10-CM | POA: Diagnosis not present

## 2021-02-21 DIAGNOSIS — G96191 Perineural cyst: Secondary | ICD-10-CM | POA: Insufficient documentation

## 2021-02-21 DIAGNOSIS — I517 Cardiomegaly: Secondary | ICD-10-CM | POA: Insufficient documentation

## 2021-02-21 DIAGNOSIS — D61818 Other pancytopenia: Secondary | ICD-10-CM

## 2021-02-21 DIAGNOSIS — J984 Other disorders of lung: Secondary | ICD-10-CM | POA: Insufficient documentation

## 2021-02-21 DIAGNOSIS — Z7189 Other specified counseling: Secondary | ICD-10-CM

## 2021-02-21 LAB — CMP (CANCER CENTER ONLY)
ALT: <6 U/L (ref 0–44)
AST: 13 U/L — ABNORMAL LOW (ref 15–41)
Albumin: 3.8 g/dL (ref 3.5–5.0)
Alkaline Phosphatase: 67 U/L (ref 38–126)
Anion gap: 8 (ref 5–15)
BUN: 13 mg/dL (ref 8–23)
CO2: 25 mmol/L (ref 22–32)
Calcium: 8.9 mg/dL (ref 8.9–10.3)
Chloride: 107 mmol/L (ref 98–111)
Creatinine: 0.75 mg/dL (ref 0.44–1.00)
GFR, Estimated: >60 mL/min (ref >60)
Glucose, Bld: 87 mg/dL (ref 70–99)
Potassium: 3.7 mmol/L (ref 3.5–5.1)
Sodium: 140 mmol/L (ref 135–145)
Total Bilirubin: 0.4 mg/dL (ref 0.3–1.2)
Total Protein: 6.9 g/dL (ref 6.5–8.1)

## 2021-02-21 LAB — CBC WITH DIFFERENTIAL (CANCER CENTER ONLY)
Abs Immature Granulocytes: 0.04 10*3/uL (ref 0.00–0.07)
Basophils Absolute: 0 10*3/uL (ref 0.0–0.1)
Basophils Relative: 1 %
Eosinophils Absolute: 0.1 10*3/uL (ref 0.0–0.5)
Eosinophils Relative: 2 %
HCT: 33.3 % — ABNORMAL LOW (ref 36.0–46.0)
Hemoglobin: 11.2 g/dL — ABNORMAL LOW (ref 12.0–15.0)
Immature Granulocytes: 1 %
Lymphocytes Relative: 18 %
Lymphs Abs: 0.9 10*3/uL (ref 0.7–4.0)
MCH: 32.8 pg (ref 26.0–34.0)
MCHC: 33.6 g/dL (ref 30.0–36.0)
MCV: 97.7 fL (ref 80.0–100.0)
Monocytes Absolute: 0.7 10*3/uL (ref 0.1–1.0)
Monocytes Relative: 13 %
Neutro Abs: 3.5 10*3/uL (ref 1.7–7.7)
Neutrophils Relative %: 65 %
Platelet Count: 179 10*3/uL (ref 150–400)
RBC: 3.41 MIL/uL — ABNORMAL LOW (ref 3.87–5.11)
RDW: 11.4 % — ABNORMAL LOW (ref 11.5–15.5)
WBC Count: 5.2 10*3/uL (ref 4.0–10.5)
nRBC: 0 % (ref 0.0–0.2)

## 2021-02-21 LAB — TSH: TSH: 2.236 u[IU]/mL (ref 0.308–3.960)

## 2021-02-21 MED ORDER — DIPHENHYDRAMINE HCL 25 MG PO CAPS
ORAL_CAPSULE | ORAL | Status: AC
  Filled 2021-02-21: qty 1

## 2021-02-21 MED ORDER — CYANOCOBALAMIN 1000 MCG/ML IJ SOLN
1000.0000 ug | Freq: Once | INTRAMUSCULAR | Status: AC
  Administered 2021-02-21: 1000 ug via INTRAMUSCULAR

## 2021-02-21 MED ORDER — SODIUM CHLORIDE 0.9 % IV SOLN
200.0000 mg | Freq: Once | INTRAVENOUS | Status: AC
  Administered 2021-02-21: 200 mg via INTRAVENOUS
  Filled 2021-02-21: qty 8

## 2021-02-21 MED ORDER — DIPHENHYDRAMINE HCL 25 MG PO CAPS
25.0000 mg | ORAL_CAPSULE | Freq: Once | ORAL | Status: AC
Start: 2021-02-21 — End: 2021-02-21
  Administered 2021-02-21: 25 mg via ORAL

## 2021-02-21 MED ORDER — SODIUM CHLORIDE 0.9 % IV SOLN
Freq: Once | INTRAVENOUS | Status: AC
  Filled 2021-02-21: qty 250

## 2021-02-21 MED ORDER — CYANOCOBALAMIN 1000 MCG/ML IJ SOLN
INTRAMUSCULAR | Status: AC
  Filled 2021-02-21: qty 1

## 2021-02-21 NOTE — Assessment & Plan Note (Signed)
So far, she tolerated treatment very well with no side effects I recommend minimum 4 cycles of treatment before repeat imaging study She is in agreement

## 2021-02-21 NOTE — Assessment & Plan Note (Signed)
With aggressive vitamin B12 replacement, her leukopenia has resolved She is mildly anemic I am hopeful her hemoglobin will improve back to normal by next visit

## 2021-02-21 NOTE — Progress Notes (Signed)
[facility] OFFICE PROGRESS NOTE  Patient Care Team: Default, Provider, MD as PCP - General  ASSESSMENT & PLAN:  Malignant neoplasm of cervix ([place]) So far, she tolerated treatment very well with no side effects I recommend minimum 4 cycles of treatment before repeat imaging study She is in agreement  Vitamin B12 deficiency She has received aggressive vitamin B12 replacement therapy I plan to check B12 level in her next visit  Pancytopenia, acquired ([place]) With aggressive vitamin B12 replacement, her leukopenia has resolved She is mildly anemic I am hopeful her hemoglobin will improve back to normal by next visit  Orders Placed This Encounter  Procedures   Vitamin B12    Standing Status:   Future    Standing Expiration Date:   02/21/2022    All questions were answered. The patient knows to call the clinic with any problems, questions or concerns. The total time spent in the appointment was 20 minutes encounter with patients including review of chart and various tests results, discussions about plan of care and coordination of care plan   [name], MD 02/21/2021 2:17 PM  INTERVAL HISTORY: Please see below for problem oriented charting. She returns for treatment and follow-up She feels well Denies side effects from treatment No recent vaginal bleeding Her energy level is fair  SUMMARY OF ONCOLOGIC HISTORY: Oncology History Overview Note  Cancer Staging Malignant neoplasm of cervix ([place]) Staging form: Cervix Uteri, AJCC 8th Edition - Clinical: Stage IIIB (cT3b, cN1, cM0) - Signed by [name], MD on 05/04/2018  PD-L1 1%    Malignant neoplasm of cervix ([place])  03/13/2018 Imaging   US pelvis There are mobile echogenic foci within the endometrial cavity suggestive of gas. This is nonspecific in etiology however may be secondary to endometritis. Correlate for history of recent instrumentation. This obscures visualization of the endometrial tissue and  therefore a follow-up pelvic ultrasound in 2-4 weeks is recommended after resolution of the acute symptomatology if symptoms persist to further evaluate the endometrium.      03/13/2018 Initial Diagnosis   She initially presented with PMB    04/12/2018 Pathology Results   Cervix, biopsy, mass - INVASIVE SQUAMOUS CELL CARCINOMA - SEE COMMENT    04/12/2018 Surgery   PREOPERATIVE DIAGNOSIS:  Postmenopausal vaginal bleeding. POSTOPERATIVE DIAGNOSIS: The same PROCEDURE: Exam under anesthesia, pap smear, cervical mass biopsy SURGEON:  Dr. [name]   INDICATIONS: 70 y.o. yo [id number] with PMB here for exam under anesthesia.Risks of surgery were discussed with the patient including but not limited to: bleeding which may require transfusion; infection which may require antibiotics; injury to uterus or surrounding organs; need for additional procedures including laparotomy or laparoscopy; and other postoperative/anesthesia complications. Written informed consent was obtained.     FINDINGS:  An 8-week size midline uterus.  No adnexal mass palpable on exam. Normal cervix not visualized. Friable mass seen in involving the vagina at the level of the cervix obliterating the anterior and posterior fornix.   ANESTHESIA:   General INTRAVENOUS FLUIDS:  300 ml of LR ESTIMATED BLOOD LOSS: 20 ml. SPECIMENS: pap smear, cervical mass biopsy COMPLICATIONS:  None immediate.      04/25/2018 PET scan   1. Large cervical mass may invade into the myometrium in down into the vagina, maximum SUV 21.3. There is a malignant left external iliac node measuring 1.4 cm in short axis with maximum SUV 14.1. 2. Accentuated activity in the cecum and ascending colon is likely physiologic given that it has no  CT correlate. Correlation with the patient's colon cancer screening history is recommended. If screening is not up-to-date, appropriate screening should be considered. 3.  Aortic Atherosclerosis (ICD10-I70.0).       04/28/2018 Surgery   Pre-operative Diagnosis:  At least Stage 2B SCCa Cervical Cancer   Post-operative Diagnosis: At least clinical Stage 3A SCCa Cervical Cancer   Operation:  Exam under anesthesia due to intolerance for vaginal exam in office Cystoscopy due to concern for extension to posterior bladder (from anterior vaginal wall lesion)   Operative Findings:   Parametrial involvement on right  Right sided subvaginal tumor burden ~3cm. This approximates the right ureteral insertion into the bladder based on my exam during cystoscopy. Extension of disease down upper half of vagina on lateral and posterior walls. Extension of disease down to lower third of anterior vagina (versus skip lesion) thus making her Stage 3A Cystoscopy revealed patent bilateral ureteral orifices and no bladder mucosa invasion or areas of concern. Rectal exam grossly no disease.     05/04/2018 Cancer Staging   Staging form: Cervix Uteri, AJCC 8th Edition - Clinical: Stage IIIB (cT3b, cN1, cM0) - Signed by [name], MD on 05/04/2018    05/12/2018 Procedure   Successful 8 French right internal jugular vein power port placement with its tip at the SVC/RA junction    05/13/2018 - 06/24/2018 Chemotherapy   The patient had weekly cisplatin given with concurrent radiation     05/17/2018 - 07/27/2018 Radiation Therapy   Radiation treatment dates:   05/17/2018-07/27/2018   Site/dose: 1. Cervix, 1.8 Gy in 25 fractions for a total dose of 45 Gy                    2. Pelvis  Boost, 1.8 Gy in 5 fractions for a total dose of 9 Gy                    3. Cervix, 5.5 Gy in 5 fractions for a total dose of 27.5 Gy    06/03/2018 Adverse Reaction   She missed her chemo due to severe depression and was sent to the ER for suicidal ideation.  She was seen by psychiatrist.    10/27/2018 PET scan   IMPRESSION: 1. Complete metabolic response to therapy, without residual or recurrent hypermetabolic disease. 2.  Aortic  Atherosclerosis (ICD10-I70.0).      11/17/2018 Imaging   Successful right IJ vein Port-A-Cath explant.    03/29/2020 PET scan   1. Hypermetabolic lesion along the right vaginal cuff is highly worrisome for recurrent cervical cancer. No evidence of distant metastatic disease. 2.  Aortic atherosclerosis (ICD10-I70.0).   04/26/2020 -  Chemotherapy   The patient had CARBOplatin  for chemotherapy treatment.      Genetic Testing   Patient has genetic testing done for PD-L1. Results revealed patient has the following:  PD-L1 combined positive score (CPS): 1%   07/08/2020 Imaging   1. Unchanged post treatment appearance of the pelvis, including a soft tissue nodule in the low right hemipelvis adjacent to the vagina measuring 2.8 x 1.7 cm, previously FDG avid and consistent with malignancy. 2. No evidence of nodal or distant metastatic disease in the abdomen or pelvis. 3. Aortic Atherosclerosis (ICD10-I70.0).   10/14/2020 Imaging   1. Unchanged right eccentric soft tissue mass in the low right hemipelvis centered on the cervix and vagina, measuring 2.9 x 1.9 cm. 2. New small volume fluid within the endometrial cavity, abnormal in the postmenopausal setting and  suggesting obstruction of the cervical os by soft tissue mass. 3. No evidence of lymphadenopathy or distant metastatic disease in the abdomen or pelvis.   Aortic Atherosclerosis (ICD10-I70.0).   12/24/2020 Imaging   1. Abnormal hypodense masslike appearance continuous with hypodense masslike lesion probably centrally within a right eccentric cervix (versus right paracervical mass). The endometrial portion has enlarged compared to 10/14/2020 and active malignancy is suspected. 2. No adenopathy or findings of distant spread. 3. Other imaging findings of potential clinical significance: Aortic Atherosclerosis (ICD10-I70.0). Mild cardiomegaly. Stable scarring in the left lower lobe. Sigmoid colon scattered diverticula. Tarlov cysts at the S2  level. Vertical sclerosis in the sacral ala possibly due to stress fractures or prior radiation therapy.     01/06/2021 -  Chemotherapy    Patient is on Treatment Plan: UTERINE PEMBROLIZUMAB Q21D         REVIEW OF SYSTEMS:   Constitutional: Denies fevers, chills or abnormal weight loss Eyes: Denies blurriness of vision Ears, nose, mouth, throat, and face: Denies mucositis or sore throat Respiratory: Denies cough, dyspnea or wheezes Cardiovascular: Denies palpitation, chest discomfort or lower extremity swelling Gastrointestinal:  Denies nausea, heartburn or change in bowel habits Skin: Denies abnormal skin rashes Lymphatics: Denies new lymphadenopathy or easy bruising Neurological:Denies numbness, tingling or new weaknesses Behavioral/Psych: Mood is stable, no new changes  All other systems were reviewed with the patient and are negative.  I have reviewed the past medical history, past surgical history, social history and family history with the patient and they are unchanged from previous note.  ALLERGIES:  has No Known Allergies.  MEDICATIONS:  Current Outpatient Medications  Medication Sig Dispense Refill   Multiple Vitamin (MULTIVITAMIN) tablet Take 1 tablet by mouth daily.     ondansetron (ZOFRAN) 8 MG tablet Take 1 tablet (8 mg total) by mouth every 8 (eight) hours as needed for refractory nausea / vomiting. Start on day 3 after carboplatin chemo. 30 tablet 1   prochlorperazine (COMPAZINE) 10 MG tablet Take 1 tablet (10 mg total) by mouth every 6 (six) hours as needed (Nausea or vomiting). 30 tablet 1   No current facility-administered medications for this visit.    PHYSICAL EXAMINATION: ECOG PERFORMANCE STATUS: 1 - Symptomatic but completely ambulatory  Vitals:   02/21/21 1109  BP: 139/68  Pulse: 82  Resp: 18  Temp: 98.2 F (36.8 C)  SpO2: 97%   Filed Weights   02/21/21 1109  Weight: 89 lb 9.6 oz (40.6 kg)    GENERAL:alert, no distress and  comfortable SKIN: skin color, texture, turgor are normal, no rashes or significant lesions EYES: normal, Conjunctiva are pink and non-injected, sclera clear OROPHARYNX:no exudate, no erythema and lips, buccal mucosa, and tongue normal  NECK: supple, thyroid normal size, non-tender, without nodularity LYMPH:  no palpable lymphadenopathy in the cervical, axillary or inguinal LUNGS: clear to auscultation and percussion with normal breathing effort HEART: regular rate & rhythm and no murmurs and no lower extremity edema ABDOMEN:abdomen soft, non-tender and normal bowel sounds Musculoskeletal:no cyanosis of digits and no clubbing  NEURO: alert & oriented x 3 with fluent speech, no focal motor/sensory deficits  LABORATORY DATA:  I have reviewed the data as listed    Component Value Date/Time   NA 140 02/21/2021 1048   K 3.7 02/21/2021 1048   CL 107 02/21/2021 1048   CO2 25 02/21/2021 1048   GLUCOSE 87 02/21/2021 1048   BUN 13 02/21/2021 1048   CREATININE 0.75 02/21/2021 1048   CALCIUM 8.9  02/21/2021 1048   PROT 6.9 02/21/2021 1048   ALBUMIN 3.8 02/21/2021 1048   AST 13 (L) 02/21/2021 1048   ALT <6 02/21/2021 1048   ALKPHOS 67 02/21/2021 1048   BILITOT 0.4 02/21/2021 1048   GFRNONAA >60 02/21/2021 1048   GFRAA >60 06/14/2020 0844   GFRAA >60 03/11/2020 0951    No results found for: SPEP, UPEP  Lab Results  Component Value Date   WBC 5.2 02/21/2021   NEUTROABS 3.5 02/21/2021   HGB 11.2 (L) 02/21/2021   HCT 33.3 (L) 02/21/2021   MCV 97.7 02/21/2021   PLT 179 02/21/2021      Chemistry      Component Value Date/Time   NA 140 02/21/2021 1048   K 3.7 02/21/2021 1048   CL 107 02/21/2021 1048   CO2 25 02/21/2021 1048   BUN 13 02/21/2021 1048   CREATININE 0.75 02/21/2021 1048      Component Value Date/Time   CALCIUM 8.9 02/21/2021 1048   ALKPHOS 67 02/21/2021 1048   AST 13 (L) 02/21/2021 1048   ALT <6 02/21/2021 1048   BILITOT 0.4 02/21/2021 1048

## 2021-02-21 NOTE — Assessment & Plan Note (Signed)
She has received aggressive vitamin B12 replacement therapy I plan to check B12 level in her next visit

## 2021-02-21 NOTE — Patient Instructions (Signed)
[facility] MEDICAL [facility]COLOGY  Discharge Instructions: Thank you for choosing [facility] to provide your oncology and hematology care.   If you have a lab appointment with the Cancer Center, please go directly to the Cancer Center and check in at the registration area.   Wear comfortable clothing and clothing appropriate for easy access to any Portacath or PICC line.   We strive to give you quality time with your provider. You may need to reschedule your appointment if you arrive late (15 or more minutes).  Arriving late affects you and other patients whose appointments are after yours.  Also, if you miss three or more appointments without notifying the office, you may be dismissed from the clinic at the provider's discretion.      For prescription refill requests, have your pharmacy contact our office and allow 72 hours for refills to be completed.    Today you received the following chemotherapy and/or immunotherapy agents: keytruda      To help prevent nausea and vomiting after your treatment, we encourage you to take your nausea medication as directed.  BELOW ARE SYMPTOMS THAT SHOULD BE REPORTED IMMEDIATELY: *FEVER GREATER THAN 100.4 F (38 C) OR HIGHER *CHILLS OR SWEATING *NAUSEA AND VOMITING THAT IS NOT CONTROLLED WITH YOUR NAUSEA MEDICATION *UNUSUAL SHORTNESS OF BREATH *UNUSUAL BRUISING OR BLEEDING *URINARY PROBLEMS (pain or burning when urinating, or frequent urination) *BOWEL PROBLEMS (unusual diarrhea, constipation, pain near the anus) TENDERNESS IN MOUTH AND THROAT WITH OR WITHOUT PRESENCE OF ULCERS (sore throat, sores in mouth, or a toothache) UNUSUAL RASH, SWELLING OR PAIN  UNUSUAL VAGINAL DISCHARGE OR ITCHING   Items with * indicate a potential emergency and should be followed up as soon as possible or go to the Emergency Department if any problems should occur.  Please show the CHEMOTHERAPY ALERT CARD or IMMUNOTHERAPY ALERT CARD at check-in to  the Emergency Department and triage nurse.  Should you have questions after your visit or need to cancel or reschedule your appointment, please contact [facility]  Dept: [phone]  and follow the prompts.  Office hours are 8:00 a.m. to 4:30 p.m. Monday - Friday. Please note that voicemails left after 4:00 p.m. may not be returned until the following business day.  We are closed weekends and major holidays. You have access to a nurse at all times for urgent questions. Please call the main number to the clinic Dept: [phone] and follow the prompts.   For any non-urgent questions, you may also contact your provider using MyChart. We now offer e-Visits for anyone 18 and older to request care online for non-urgent symptoms. For details visit [email or web address].   Also download the MyChart app! Go to the app store, search "MyChart", open the app, select [facility], and log in with your MyChart username and password.  Due to Covid, a mask is required upon entering the hospital/clinic. If you do not have a mask, one will be given to you upon arrival. For doctor visits, patients may have 1 support person aged 18 or older with them. For treatment visits, patients cannot have anyone with them due to current Covid guidelines and our immunocompromised population.   

## 2021-02-22 LAB — T4: T4, Total: 8.1 ug/dL (ref 4.5–12.0)

## 2021-03-20 ENCOUNTER — Telehealth: Payer: Self-pay | Admitting: Hematology and Oncology

## 2021-03-20 ENCOUNTER — Inpatient Hospital Stay: Payer: Medicare Other | Attending: Hematology and Oncology

## 2021-03-20 ENCOUNTER — Inpatient Hospital Stay: Payer: Medicare Other

## 2021-03-20 ENCOUNTER — Other Ambulatory Visit: Payer: Self-pay

## 2021-03-20 ENCOUNTER — Inpatient Hospital Stay (HOSPITAL_BASED_OUTPATIENT_CLINIC_OR_DEPARTMENT_OTHER): Payer: Medicare Other | Admitting: Hematology and Oncology

## 2021-03-20 ENCOUNTER — Encounter: Payer: Self-pay | Admitting: Hematology and Oncology

## 2021-03-20 VITALS — BP 136/79 | HR 83 | Temp 97.7°F | Resp 18 | Ht 62.0 in | Wt 91.2 lb

## 2021-03-20 DIAGNOSIS — C539 Malignant neoplasm of cervix uteri, unspecified: Secondary | ICD-10-CM

## 2021-03-20 DIAGNOSIS — Z79899 Other long term (current) drug therapy: Secondary | ICD-10-CM | POA: Diagnosis not present

## 2021-03-20 DIAGNOSIS — E538 Deficiency of other specified B group vitamins: Secondary | ICD-10-CM | POA: Insufficient documentation

## 2021-03-20 DIAGNOSIS — Z923 Personal history of irradiation: Secondary | ICD-10-CM | POA: Insufficient documentation

## 2021-03-20 DIAGNOSIS — Z5112 Encounter for antineoplastic immunotherapy: Secondary | ICD-10-CM | POA: Insufficient documentation

## 2021-03-20 DIAGNOSIS — Z9221 Personal history of antineoplastic chemotherapy: Secondary | ICD-10-CM | POA: Insufficient documentation

## 2021-03-20 DIAGNOSIS — Z7189 Other specified counseling: Secondary | ICD-10-CM

## 2021-03-20 DIAGNOSIS — D61818 Other pancytopenia: Secondary | ICD-10-CM

## 2021-03-20 LAB — CBC WITH DIFFERENTIAL (CANCER CENTER ONLY)
Abs Immature Granulocytes: 0.04 10*3/uL (ref 0.00–0.07)
Basophils Absolute: 0 10*3/uL (ref 0.0–0.1)
Basophils Relative: 1 %
Eosinophils Absolute: 0.1 10*3/uL (ref 0.0–0.5)
Eosinophils Relative: 1 %
HCT: 32.3 % — ABNORMAL LOW (ref 36.0–46.0)
Hemoglobin: 10.8 g/dL — ABNORMAL LOW (ref 12.0–15.0)
Immature Granulocytes: 1 %
Lymphocytes Relative: 19 %
Lymphs Abs: 0.8 10*3/uL (ref 0.7–4.0)
MCH: 31.6 pg (ref 26.0–34.0)
MCHC: 33.4 g/dL (ref 30.0–36.0)
MCV: 94.4 fL (ref 80.0–100.0)
Monocytes Absolute: 0.5 10*3/uL (ref 0.1–1.0)
Monocytes Relative: 12 %
Neutro Abs: 2.8 10*3/uL (ref 1.7–7.7)
Neutrophils Relative %: 66 %
Platelet Count: 177 10*3/uL (ref 150–400)
RBC: 3.42 MIL/uL — ABNORMAL LOW (ref 3.87–5.11)
RDW: 11.5 % (ref 11.5–15.5)
WBC Count: 4.3 10*3/uL (ref 4.0–10.5)
nRBC: 0 % (ref 0.0–0.2)

## 2021-03-20 LAB — CMP (CANCER CENTER ONLY)
ALT: 6 U/L (ref 0–44)
AST: 14 U/L — ABNORMAL LOW (ref 15–41)
Albumin: 3.7 g/dL (ref 3.5–5.0)
Alkaline Phosphatase: 67 U/L (ref 38–126)
Anion gap: 8 (ref 5–15)
BUN: 17 mg/dL (ref 8–23)
CO2: 25 mmol/L (ref 22–32)
Calcium: 8.7 mg/dL — ABNORMAL LOW (ref 8.9–10.3)
Chloride: 107 mmol/L (ref 98–111)
Creatinine: 0.79 mg/dL (ref 0.44–1.00)
GFR, Estimated: >60 mL/min (ref >60)
Glucose, Bld: 79 mg/dL (ref 70–99)
Potassium: 3.7 mmol/L (ref 3.5–5.1)
Sodium: 140 mmol/L (ref 135–145)
Total Bilirubin: 0.4 mg/dL (ref 0.3–1.2)
Total Protein: 6.9 g/dL (ref 6.5–8.1)

## 2021-03-20 LAB — TSH: TSH: 2.672 u[IU]/mL (ref 0.308–3.960)

## 2021-03-20 LAB — VITAMIN B12: Vitamin B-12: 128 pg/mL — ABNORMAL LOW (ref 180–914)

## 2021-03-20 MED ORDER — HEPARIN SOD (PORK) LOCK FLUSH 100 UNIT/ML IV SOLN
500.0000 [IU] | Freq: Once | INTRAVENOUS | Status: DC | PRN
  Filled 2021-03-20: qty 5

## 2021-03-20 MED ORDER — CYANOCOBALAMIN 1000 MCG/ML IJ SOLN
1000.0000 ug | Freq: Once | INTRAMUSCULAR | Status: AC
  Administered 2021-03-20: 1000 ug via INTRAMUSCULAR

## 2021-03-20 MED ORDER — SODIUM CHLORIDE 0.9% FLUSH
10.0000 mL | INTRAVENOUS | Status: DC | PRN
  Filled 2021-03-20: qty 10

## 2021-03-20 MED ORDER — SODIUM CHLORIDE 0.9 % IV SOLN
Freq: Once | INTRAVENOUS | Status: AC
  Filled 2021-03-20: qty 250

## 2021-03-20 MED ORDER — CYANOCOBALAMIN 1000 MCG/ML IJ SOLN
INTRAMUSCULAR | Status: AC
  Filled 2021-03-20: qty 1

## 2021-03-20 MED ORDER — SODIUM CHLORIDE 0.9 % IV SOLN
200.0000 mg | Freq: Once | INTRAVENOUS | Status: AC
  Administered 2021-03-20: 200 mg via INTRAVENOUS
  Filled 2021-03-20: qty 8

## 2021-03-20 NOTE — Assessment & Plan Note (Signed)
Overall, she tolerated treatment very well with no side effects I plan to repeat CT imaging in her next visit

## 2021-03-20 NOTE — Progress Notes (Signed)
Per Dr. Alvy Bimler, can proceed with pembrolizumab without CMP results.

## 2021-03-20 NOTE — Assessment & Plan Note (Signed)
After many B12 injections, her B12 level has improved but she is still profoundly deficient She will continue to get B12 injection with every visit

## 2021-03-20 NOTE — Progress Notes (Signed)
[place] OFFICE PROGRESS NOTE  Patient Care Team: Default, Provider, MD as PCP - General  ASSESSMENT & PLAN:  Malignant neoplasm of cervix ([place]) Overall, she tolerated treatment very well with no side effects I plan to repeat CT imaging in her next visit  Vitamin B12 deficiency After many B12 injections, her B12 level has improved but she is still profoundly deficient She will continue to get B12 injection with every visit  Orders Placed This Encounter  Procedures   CT CHEST ABDOMEN PELVIS W CONTRAST    Standing Status:   Future    Standing Expiration Date:   03/20/2022    Order Specific Question:   Preferred imaging location?    Answer:   [facility]    Order Specific Question:   Radiology Contrast Protocol - do NOT remove file path    Answer:   [email or web address]    All questions were answered. The patient knows to call the clinic with any problems, questions or concerns. The total time spent in the appointment was 20 minutes encounter with patients including review of chart and various tests results, discussions about plan of care and coordination of care plan   [name], MD 03/20/2021 12:22 PM  INTERVAL HISTORY: Please see below for problem oriented charting. She reports her treatment with [facility] She denies recent pelvic pain or vaginal bleeding She have no side effects from [facility] so far No recent weight loss  SUMMARY OF ONCOLOGIC HISTORY: Oncology History Overview Note  Cancer Staging Malignant neoplasm of cervix ([place]) Staging form: Cervix Uteri, AJCC 8th Edition - Clinical: Stage IIIB (cT3b, cN1, cM0) - Signed by [name], MD on 05/04/2018  PD-L1 1%    Malignant neoplasm of cervix ([place])  03/13/2018 Imaging   US pelvis There are mobile echogenic foci within the endometrial cavity suggestive of gas. This is nonspecific in etiology however may be secondary to endometritis. Correlate for history  of recent instrumentation. This obscures visualization of the endometrial tissue and therefore a follow-up pelvic ultrasound in 2-4 weeks is recommended after resolution of the acute symptomatology if symptoms persist to further evaluate the endometrium.      03/13/2018 Initial Diagnosis   She initially presented with PMB    04/12/2018 Pathology Results   Cervix, biopsy, mass - INVASIVE SQUAMOUS CELL CARCINOMA - SEE COMMENT    04/12/2018 Surgery   PREOPERATIVE DIAGNOSIS:  Postmenopausal vaginal bleeding. POSTOPERATIVE DIAGNOSIS: The same PROCEDURE: Exam under anesthesia, pap smear, cervical mass biopsy SURGEON:  Dr. [name]   INDICATIONS: 70 y.o. yo G0P0000 with PMB here for exam under anesthesia.Risks of surgery were discussed with the patient including but not limited to: bleeding which may require transfusion; infection which may require antibiotics; injury to uterus or surrounding organs; need for additional procedures including laparotomy or laparoscopy; and other postoperative/anesthesia complications. Written informed consent was obtained.     FINDINGS:  An 8-week size midline uterus.  No adnexal mass palpable on exam. Normal cervix not visualized. Friable mass seen in involving the vagina at the level of the cervix obliterating the anterior and posterior fornix.   ANESTHESIA:   General INTRAVENOUS FLUIDS:  300 ml of LR ESTIMATED BLOOD LOSS: 20 ml. SPECIMENS: pap smear, cervical mass biopsy COMPLICATIONS:  None immediate.      04/25/2018 PET scan   1. Large cervical mass may invade into the myometrium in down into the vagina, maximum SUV 21.3. There is a malignant left external iliac node measuring 1.4 cm in  short axis with maximum SUV 14.1. 2. Accentuated activity in the cecum and ascending colon is likely physiologic given that it has no CT correlate. Correlation with the patient's colon cancer screening history is recommended. If screening is not up-to-date, appropriate  screening should be considered. 3.  Aortic Atherosclerosis (ICD10-I70.0).      04/28/2018 Surgery   Pre-operative Diagnosis:  At least Stage 2B SCCa Cervical Cancer   Post-operative Diagnosis: At least clinical Stage 3A SCCa Cervical Cancer   Operation:  Exam under anesthesia due to intolerance for vaginal exam in office Cystoscopy due to concern for extension to posterior bladder (from anterior vaginal wall lesion)   Operative Findings:   Parametrial involvement on right  Right sided subvaginal tumor burden ~3cm. This approximates the right ureteral insertion into the bladder based on my exam during cystoscopy. Extension of disease down upper half of vagina on lateral and posterior walls. Extension of disease down to lower third of anterior vagina (versus skip lesion) thus making her Stage 3A Cystoscopy revealed patent bilateral ureteral orifices and no bladder mucosa invasion or areas of concern. Rectal exam grossly no disease.     05/04/2018 Cancer Staging   Staging form: Cervix Uteri, AJCC 8th Edition - Clinical: Stage IIIB (cT3b, cN1, cM0) - Signed by [name], MD on 05/04/2018    05/12/2018 Procedure   Successful 8 French right internal jugular vein power port placement with its tip at the SVC/RA junction    05/13/2018 - 06/24/2018 Chemotherapy   The patient had weekly cisplatin given with concurrent radiation     05/17/2018 - 07/27/2018 Radiation Therapy   Radiation treatment dates:   05/17/2018-07/27/2018   Site/dose: 1. Cervix, 1.8 Gy in 25 fractions for a total dose of 45 Gy                    2. Pelvis  Boost, 1.8 Gy in 5 fractions for a total dose of 9 Gy                    3. Cervix, 5.5 Gy in 5 fractions for a total dose of 27.5 Gy    06/03/2018 Adverse Reaction   She missed her chemo due to severe depression and was sent to the ER for suicidal ideation.  She was seen by psychiatrist.    10/27/2018 PET scan   IMPRESSION: 1. Complete metabolic response to  therapy, without residual or recurrent hypermetabolic disease. 2.  Aortic Atherosclerosis (ICD10-I70.0).      11/17/2018 Imaging   Successful right IJ vein Port-A-Cath explant.    03/29/2020 PET scan   1. Hypermetabolic lesion along the right vaginal cuff is highly worrisome for recurrent cervical cancer. No evidence of distant metastatic disease. 2.  Aortic atherosclerosis (ICD10-I70.0).   04/26/2020 -  Chemotherapy   The patient had CARBOplatin  for chemotherapy treatment.      Genetic Testing   Patient has genetic testing done for PD-L1. Results revealed patient has the following:  PD-L1 combined positive score (CPS): 1%   07/08/2020 Imaging   1. Unchanged post treatment appearance of the pelvis, including a soft tissue nodule in the low right hemipelvis adjacent to the vagina measuring 2.8 x 1.7 cm, previously FDG avid and consistent with malignancy. 2. No evidence of nodal or distant metastatic disease in the abdomen or pelvis. 3. Aortic Atherosclerosis (ICD10-I70.0).   10/14/2020 Imaging   1. Unchanged right eccentric soft tissue mass in the low right hemipelvis centered on the  cervix and vagina, measuring 2.9 x 1.9 cm. 2. New small volume fluid within the endometrial cavity, abnormal in the postmenopausal setting and suggesting obstruction of the cervical os by soft tissue mass. 3. No evidence of lymphadenopathy or distant metastatic disease in the abdomen or pelvis.   Aortic Atherosclerosis (ICD10-I70.0).   12/24/2020 Imaging   1. Abnormal hypodense masslike appearance continuous with hypodense masslike lesion probably centrally within a right eccentric cervix (versus right paracervical mass). The endometrial portion has enlarged compared to 10/14/2020 and active malignancy is suspected. 2. No adenopathy or findings of distant spread. 3. Other imaging findings of potential clinical significance: Aortic Atherosclerosis (ICD10-I70.0). Mild cardiomegaly. Stable scarring in the  left lower lobe. Sigmoid colon scattered diverticula. Tarlov cysts at the S2 level. Vertical sclerosis in the sacral ala possibly due to stress fractures or prior radiation therapy.     01/06/2021 -  Chemotherapy    Patient is on Treatment Plan: UTERINE PEMBROLIZUMAB Q21D         REVIEW OF SYSTEMS:   Constitutional: Denies fevers, chills or abnormal weight loss Eyes: Denies blurriness of vision Ears, nose, mouth, throat, and face: Denies mucositis or sore throat Respiratory: Denies cough, dyspnea or wheezes Cardiovascular: Denies palpitation, chest discomfort or lower extremity swelling Gastrointestinal:  Denies nausea, heartburn or change in bowel habits Skin: Denies abnormal skin rashes Lymphatics: Denies new lymphadenopathy or easy bruising Neurological:Denies numbness, tingling or new weaknesses Behavioral/Psych: Mood is stable, no new changes  All other systems were reviewed with the patient and are negative.  I have reviewed the past medical history, past surgical history, social history and family history with the patient and they are unchanged from previous note.  ALLERGIES:  has No Known Allergies.  MEDICATIONS:  Current Outpatient Medications  Medication Sig Dispense Refill   Multiple Vitamin (MULTIVITAMIN) tablet Take 1 tablet by mouth daily.     ondansetron (ZOFRAN) 8 MG tablet Take 1 tablet (8 mg total) by mouth every 8 (eight) hours as needed for refractory nausea / vomiting. Start on day 3 after carboplatin chemo. 30 tablet 1   prochlorperazine (COMPAZINE) 10 MG tablet Take 1 tablet (10 mg total) by mouth every 6 (six) hours as needed (Nausea or vomiting). 30 tablet 1   No current facility-administered medications for this visit.   Facility-Administered Medications Ordered in Other Visits  Medication Dose Route Frequency Provider Last Rate Last Admin   heparin lock flush 100 unit/mL  500 Units Intracatheter Once PRN Alvy Bimler, Kaelen Caughlin, MD       sodium chloride flush  (NS) 0.9 % injection 10 mL  10 mL Intracatheter PRN Alvy Bimler, Aristides Luckey, MD        PHYSICAL EXAMINATION: ECOG PERFORMANCE STATUS: 0 - Asymptomatic  Vitals:   03/20/21 0946  BP: 136/79  Pulse: 83  Resp: 18  Temp: 97.7 F (36.5 C)  SpO2: 98%   Filed Weights   03/20/21 0946  Weight: 91 lb 3.2 oz (41.4 kg)    GENERAL:alert, no distress and comfortable SKIN: skin color, texture, turgor are normal, no rashes or significant lesions EYES: normal, Conjunctiva are pink and non-injected, sclera clear OROPHARYNX:no exudate, no erythema and lips, buccal mucosa, and tongue normal  NECK: supple, thyroid normal size, non-tender, without nodularity LYMPH:  no palpable lymphadenopathy in the cervical, axillary or inguinal LUNGS: clear to auscultation and percussion with normal breathing effort HEART: regular rate & rhythm and no murmurs and no lower extremity edema ABDOMEN:abdomen soft, non-tender and normal bowel sounds Musculoskeletal:no cyanosis of  digits and no clubbing  NEURO: alert & oriented x 3 with fluent speech, no focal motor/sensory deficits  LABORATORY DATA:  I have reviewed the data as listed    Component Value Date/Time   NA 140 03/20/2021 0930   K 3.7 03/20/2021 0930   CL 107 03/20/2021 0930   CO2 25 03/20/2021 0930   GLUCOSE 79 03/20/2021 0930   BUN 17 03/20/2021 0930   CREATININE 0.79 03/20/2021 0930   CALCIUM 8.7 (L) 03/20/2021 0930   PROT 6.9 03/20/2021 0930   ALBUMIN 3.7 03/20/2021 0930   AST 14 (L) 03/20/2021 0930   ALT 6 03/20/2021 0930   ALKPHOS 67 03/20/2021 0930   BILITOT 0.4 03/20/2021 0930   GFRNONAA >60 03/20/2021 0930   GFRAA >60 06/14/2020 0844   GFRAA >60 03/11/2020 0951    No results found for: SPEP, UPEP  Lab Results  Component Value Date   WBC 4.3 03/20/2021   NEUTROABS 2.8 03/20/2021   HGB 10.8 (L) 03/20/2021   HCT 32.3 (L) 03/20/2021   MCV 94.4 03/20/2021   PLT 177 03/20/2021      Chemistry      Component Value Date/Time   NA 140  03/20/2021 0930   K 3.7 03/20/2021 0930   CL 107 03/20/2021 0930   CO2 25 03/20/2021 0930   BUN 17 03/20/2021 0930   CREATININE 0.79 03/20/2021 0930      Component Value Date/Time   CALCIUM 8.7 (L) 03/20/2021 0930   ALKPHOS 67 03/20/2021 0930   AST 14 (L) 03/20/2021 0930   ALT 6 03/20/2021 0930   BILITOT 0.4 03/20/2021 0930

## 2021-03-20 NOTE — Telephone Encounter (Signed)
Scheduled appointment per 07/07 sch msg. Patient is aware.

## 2021-03-21 LAB — T4: T4, Total: 7.4 ug/dL (ref 4.5–12.0)

## 2021-04-07 ENCOUNTER — Encounter (HOSPITAL_COMMUNITY): Payer: Self-pay

## 2021-04-07 ENCOUNTER — Other Ambulatory Visit: Payer: Self-pay

## 2021-04-07 ENCOUNTER — Ambulatory Visit (HOSPITAL_COMMUNITY)
Admission: RE | Admit: 2021-04-07 | Discharge: 2021-04-07 | Disposition: A | Discharge status: Home / Self Care | Payer: Medicare Other | Source: Ambulatory Visit | Attending: Hematology and Oncology | Admitting: Hematology and Oncology

## 2021-04-07 DIAGNOSIS — C539 Malignant neoplasm of cervix uteri, unspecified: Secondary | ICD-10-CM | POA: Insufficient documentation

## 2021-04-07 MED ORDER — IOHEXOL 350 MG/ML SOLN
100.0000 mL | Freq: Once | INTRAVENOUS | Status: AC | PRN
  Administered 2021-04-07: 80 mL via INTRAVENOUS

## 2021-04-10 ENCOUNTER — Encounter: Payer: Self-pay | Admitting: Hematology and Oncology

## 2021-04-10 ENCOUNTER — Inpatient Hospital Stay: Payer: Medicare Other

## 2021-04-10 ENCOUNTER — Other Ambulatory Visit: Payer: Self-pay

## 2021-04-10 ENCOUNTER — Inpatient Hospital Stay (HOSPITAL_BASED_OUTPATIENT_CLINIC_OR_DEPARTMENT_OTHER): Payer: Medicare Other | Admitting: Hematology and Oncology

## 2021-04-10 ENCOUNTER — Other Ambulatory Visit: Payer: Self-pay | Admitting: Hematology and Oncology

## 2021-04-10 DIAGNOSIS — C539 Malignant neoplasm of cervix uteri, unspecified: Secondary | ICD-10-CM

## 2021-04-10 DIAGNOSIS — Z7189 Other specified counseling: Secondary | ICD-10-CM

## 2021-04-10 DIAGNOSIS — E538 Deficiency of other specified B group vitamins: Secondary | ICD-10-CM

## 2021-04-10 DIAGNOSIS — Z5112 Encounter for antineoplastic immunotherapy: Secondary | ICD-10-CM | POA: Diagnosis not present

## 2021-04-10 DIAGNOSIS — D72819 Decreased white blood cell count, unspecified: Secondary | ICD-10-CM | POA: Diagnosis not present

## 2021-04-10 DIAGNOSIS — D61818 Other pancytopenia: Secondary | ICD-10-CM

## 2021-04-10 LAB — CBC WITH DIFFERENTIAL (CANCER CENTER ONLY)
Abs Immature Granulocytes: 0.02 10*3/uL (ref 0.00–0.07)
Basophils Absolute: 0 10*3/uL (ref 0.0–0.1)
Basophils Relative: 1 %
Eosinophils Absolute: 0 10*3/uL (ref 0.0–0.5)
Eosinophils Relative: 1 %
HCT: 36 % (ref 36.0–46.0)
Hemoglobin: 11.7 g/dL — ABNORMAL LOW (ref 12.0–15.0)
Immature Granulocytes: 0 %
Lymphocytes Relative: 14 %
Lymphs Abs: 0.7 10*3/uL (ref 0.7–4.0)
MCH: 30.4 pg (ref 26.0–34.0)
MCHC: 32.5 g/dL (ref 30.0–36.0)
MCV: 93.5 fL (ref 80.0–100.0)
Monocytes Absolute: 0.6 10*3/uL (ref 0.1–1.0)
Monocytes Relative: 12 %
Neutro Abs: 3.7 10*3/uL (ref 1.7–7.7)
Neutrophils Relative %: 72 %
Platelet Count: 192 10*3/uL (ref 150–400)
RBC: 3.85 MIL/uL — ABNORMAL LOW (ref 3.87–5.11)
RDW: 11.5 % (ref 11.5–15.5)
WBC Count: 5.2 10*3/uL (ref 4.0–10.5)
nRBC: 0 % (ref 0.0–0.2)

## 2021-04-10 LAB — CMP (CANCER CENTER ONLY)
ALT: 6 U/L (ref 0–44)
AST: 16 U/L (ref 15–41)
Albumin: 3.9 g/dL (ref 3.5–5.0)
Alkaline Phosphatase: 75 U/L (ref 38–126)
Anion gap: 9 (ref 5–15)
BUN: 13 mg/dL (ref 8–23)
CO2: 26 mmol/L (ref 22–32)
Calcium: 9.2 mg/dL (ref 8.9–10.3)
Chloride: 104 mmol/L (ref 98–111)
Creatinine: 0.8 mg/dL (ref 0.44–1.00)
GFR, Estimated: >60 mL/min (ref >60)
Glucose, Bld: 91 mg/dL (ref 70–99)
Potassium: 4 mmol/L (ref 3.5–5.1)
Sodium: 139 mmol/L (ref 135–145)
Total Bilirubin: 0.4 mg/dL (ref 0.3–1.2)
Total Protein: 7.4 g/dL (ref 6.5–8.1)

## 2021-04-10 LAB — TSH: TSH: 1.148 u[IU]/mL (ref 0.308–3.960)

## 2021-04-10 MED ORDER — DIPHENHYDRAMINE HCL 25 MG PO CAPS
25.0000 mg | ORAL_CAPSULE | Freq: Once | ORAL | Status: AC
  Administered 2021-04-10: 25 mg via ORAL

## 2021-04-10 MED ORDER — CYANOCOBALAMIN 1000 MCG/ML IJ SOLN
INTRAMUSCULAR | Status: AC
  Filled 2021-04-10: qty 1

## 2021-04-10 MED ORDER — DIPHENHYDRAMINE HCL 25 MG PO CAPS
ORAL_CAPSULE | ORAL | Status: AC
  Filled 2021-04-10: qty 1

## 2021-04-10 MED ORDER — CYANOCOBALAMIN 1000 MCG/ML IJ SOLN
1000.0000 ug | Freq: Once | INTRAMUSCULAR | Status: AC
  Administered 2021-04-10: 1000 ug via INTRAMUSCULAR

## 2021-04-10 MED ORDER — SODIUM CHLORIDE 0.9 % IV SOLN
Freq: Once | INTRAVENOUS | Status: AC
  Filled 2021-04-10: qty 250

## 2021-04-10 MED ORDER — SODIUM CHLORIDE 0.9 % IV SOLN
200.0000 mg | Freq: Once | INTRAVENOUS | Status: AC
  Administered 2021-04-10: 200 mg via INTRAVENOUS
  Filled 2021-04-10: qty 8

## 2021-04-10 NOTE — Assessment & Plan Note (Signed)
She continues to have chronic leukopenia She is not symptomatic I suspect this is due to B12 deficiency She will continue aggressive vitamin B-12 supplement

## 2021-04-10 NOTE — Assessment & Plan Note (Signed)
After many B12 injections, her B12 level has improved but she is still profoundly deficient She will continue to get B12 injection with every visit

## 2021-04-10 NOTE — Patient Instructions (Signed)
[facility]  Discharge Instructions: ?Thank you for choosing [facility] to provide your oncology and hematology care.  ? ?If you have a lab appointment with the Cancer Center, please go directly to the Cancer Center and check in at the registration area. ?  ?Wear comfortable clothing and clothing appropriate for easy access to any Portacath or PICC line.  ? ?We strive to give you quality time with your provider. You may need to reschedule your appointment if you arrive late (15 or more minutes).  Arriving late affects you and other patients whose appointments are after yours.  Also, if you miss three or more appointments without notifying the office, you may be dismissed from the clinic at the provider?s discretion.    ?  ?For prescription refill requests, have your pharmacy contact our office and allow 72 hours for refills to be completed.   ? ?Today you received the following chemotherapy and/or immunotherapy agents: Keytruda ?  ?To help prevent nausea and vomiting after your treatment, we encourage you to take your nausea medication as directed. ? ?BELOW ARE SYMPTOMS THAT SHOULD BE REPORTED IMMEDIATELY: ?*FEVER GREATER THAN 100.4 F (38 ?C) OR HIGHER ?*CHILLS OR SWEATING ?*NAUSEA AND VOMITING THAT IS NOT CONTROLLED WITH YOUR NAUSEA MEDICATION ?*UNUSUAL SHORTNESS OF BREATH ?*UNUSUAL BRUISING OR BLEEDING ?*URINARY PROBLEMS (pain or burning when urinating, or frequent urination) ?*BOWEL PROBLEMS (unusual diarrhea, constipation, pain near the anus) ?TENDERNESS IN MOUTH AND THROAT WITH OR WITHOUT PRESENCE OF ULCERS (sore throat, sores in mouth, or a toothache) ?UNUSUAL RASH, SWELLING OR PAIN  ?UNUSUAL VAGINAL DISCHARGE OR ITCHING  ? ?Items with * indicate a potential emergency and should be followed up as soon as possible or go to the Emergency Department if any problems should occur. ? ?Please show the CHEMOTHERAPY ALERT CARD or IMMUNOTHERAPY ALERT CARD at check-in to the  Emergency Department and triage nurse. ? ?Should you have questions after your visit or need to cancel or reschedule your appointment, please contact [facility]  Dept: [phone]  and follow the prompts.  Office hours are 8:00 a.m. to 4:30 p.m. Monday - Friday. Please note that voicemails left after 4:00 p.m. may not be returned until the following business day.  We are closed weekends and major holidays. You have access to a nurse at all times for urgent questions. Please call the main number to the clinic Dept: [phone] and follow the prompts. ? ? ?For any non-urgent questions, you may also contact your provider using MyChart. We now offer e-Visits for anyone 18 and older to request care online for non-urgent symptoms. For details visit [email or web address]. ?  ?Also download the MyChart app! Go to the app store, search "MyChart", open the app, select , and log in with your MyChart username and password. ? ?Due to Covid, a mask is required upon entering the hospital/clinic. If you do not have a mask, one will be given to you upon arrival. For doctor visits, patients may have 1 support person aged 18 or older with them. For treatment visits, patients cannot have anyone with them due to current Covid guidelines and our immunocompromised population.  ? ?

## 2021-04-10 NOTE — Assessment & Plan Note (Signed)
I have reviewed multiple CT imaging with the patient She has no signs of disease progression She tolerated treatment very well with no side effects The plan will be to continue treatment for few more months I plan to repeat CT imaging again at the end of October

## 2021-04-10 NOTE — Progress Notes (Signed)
[facility] OFFICE PROGRESS NOTE  Patient Care Team: Default, Provider, MD as PCP - General  ASSESSMENT & PLAN:  Malignant neoplasm of cervix ([place]) I have reviewed multiple CT imaging with the patient She has no signs of disease progression She tolerated treatment very well with no side effects The plan will be to continue treatment for few more months I plan to repeat CT imaging again at the end of October  Vitamin B12 deficiency After many B12 injections, her B12 level has improved but she is still profoundly deficient She will continue to get B12 injection with every visit  Leukopenia She continues to have chronic leukopenia She is not symptomatic I suspect this is due to B12 deficiency She will continue aggressive vitamin B-12 supplement  No orders of the defined types were placed in this encounter.   All questions were answered. The patient knows to call the clinic with any problems, questions or concerns. The total time spent in the appointment was 20 minutes encounter with patients including review of chart and various tests results, discussions about plan of care and coordination of care plan   [name], MD 04/10/2021 9:00 AM  INTERVAL HISTORY: Please see below for problem oriented charting. She returns for further follow-up She is doing well No side effects from treatment so far Denies recent infection No pelvic pain or vaginal discharge  SUMMARY OF ONCOLOGIC HISTORY: Oncology History Overview Note  Cancer Staging Malignant neoplasm of cervix ([place]) Staging form: Cervix Uteri, AJCC 8th Edition - Clinical: Stage IIIB (cT3b, cN1, cM0) - Signed by [name], MD on 05/04/2018  PD-L1 1%    Malignant neoplasm of cervix ([place])  03/13/2018 Imaging   US pelvis There are mobile echogenic foci within the endometrial cavity suggestive of gas. This is nonspecific in etiology however may be secondary to endometritis. Correlate for history of recent  instrumentation. This obscures visualization of the endometrial tissue and therefore a follow-up pelvic ultrasound in 2-4 weeks is recommended after resolution of the acute symptomatology if symptoms persist to further evaluate the endometrium.      03/13/2018 Initial Diagnosis   She initially presented with PMB    04/12/2018 Pathology Results   Cervix, biopsy, mass - INVASIVE SQUAMOUS CELL CARCINOMA - SEE COMMENT    04/12/2018 Surgery   PREOPERATIVE DIAGNOSIS:  Postmenopausal vaginal bleeding. POSTOPERATIVE DIAGNOSIS: The same PROCEDURE: Exam under anesthesia, pap smear, cervical mass biopsy SURGEON:  Dr. [name]   INDICATIONS: 70 y.o. yo G0P0000 with PMB here for exam under anesthesia.Risks of surgery were discussed with the patient including but not limited to: bleeding which may require transfusion; infection which may require antibiotics; injury to uterus or surrounding organs; need for additional procedures including laparotomy or laparoscopy; and other postoperative/anesthesia complications. Written informed consent was obtained.     FINDINGS:  An 8-week size midline uterus.  No adnexal mass palpable on exam. Normal cervix not visualized. Friable mass seen in involving the vagina at the level of the cervix obliterating the anterior and posterior fornix.   ANESTHESIA:   General INTRAVENOUS FLUIDS:  300 ml of LR ESTIMATED BLOOD LOSS: 20 ml. SPECIMENS: pap smear, cervical mass biopsy COMPLICATIONS:  None immediate.      04/25/2018 PET scan   1. Large cervical mass may invade into the myometrium in down into the vagina, maximum SUV 21.3. There is a malignant left external iliac node measuring 1.4 cm in short axis with maximum SUV 14.1. 2. Accentuated activity in the cecum  and ascending colon is likely physiologic given that it has no CT correlate. Correlation with the patient's colon cancer screening history is recommended. If screening is not up-to-date, appropriate screening  should be considered. 3.  Aortic Atherosclerosis (ICD10-I70.0).      04/28/2018 Surgery   Pre-operative Diagnosis:  At least Stage 2B SCCa Cervical Cancer   Post-operative Diagnosis: At least clinical Stage 3A SCCa Cervical Cancer   Operation:  Exam under anesthesia due to intolerance for vaginal exam in office Cystoscopy due to concern for extension to posterior bladder (from anterior vaginal wall lesion)   Operative Findings:   Parametrial involvement on right  Right sided subvaginal tumor burden ~3cm. This approximates the right ureteral insertion into the bladder based on my exam during cystoscopy. Extension of disease down upper half of vagina on lateral and posterior walls. Extension of disease down to lower third of anterior vagina (versus skip lesion) thus making her Stage 3A Cystoscopy revealed patent bilateral ureteral orifices and no bladder mucosa invasion or areas of concern. Rectal exam grossly no disease.     05/04/2018 Cancer Staging   Staging form: Cervix Uteri, AJCC 8th Edition - Clinical: Stage IIIB (cT3b, cN1, cM0) - Signed by [name], MD on 05/04/2018    05/12/2018 Procedure   Successful 8 French right internal jugular vein power port placement with its tip at the SVC/RA junction    05/13/2018 - 06/24/2018 Chemotherapy   The patient had weekly cisplatin given with concurrent radiation     05/17/2018 - 07/27/2018 Radiation Therapy   Radiation treatment dates:   05/17/2018-07/27/2018   Site/dose: 1. Cervix, 1.8 Gy in 25 fractions for a total dose of 45 Gy                    2. Pelvis  Boost, 1.8 Gy in 5 fractions for a total dose of 9 Gy                    3. Cervix, 5.5 Gy in 5 fractions for a total dose of 27.5 Gy    06/03/2018 Adverse Reaction   She missed her chemo due to severe depression and was sent to the ER for suicidal ideation.  She was seen by psychiatrist.    10/27/2018 PET scan   IMPRESSION: 1. Complete metabolic response to therapy,  without residual or recurrent hypermetabolic disease. 2.  Aortic Atherosclerosis (ICD10-I70.0).      11/17/2018 Imaging   Successful right IJ vein Port-A-Cath explant.    03/29/2020 PET scan   1. Hypermetabolic lesion along the right vaginal cuff is highly worrisome for recurrent cervical cancer. No evidence of distant metastatic disease. 2.  Aortic atherosclerosis (ICD10-I70.0).   04/26/2020 -  Chemotherapy   The patient had CARBOplatin  for chemotherapy treatment.      Genetic Testing   Patient has genetic testing done for PD-L1. Results revealed patient has the following:  PD-L1 combined positive score (CPS): 1%   07/08/2020 Imaging   1. Unchanged post treatment appearance of the pelvis, including a soft tissue nodule in the low right hemipelvis adjacent to the vagina measuring 2.8 x 1.7 cm, previously FDG avid and consistent with malignancy. 2. No evidence of nodal or distant metastatic disease in the abdomen or pelvis. 3. Aortic Atherosclerosis (ICD10-I70.0).   10/14/2020 Imaging   1. Unchanged right eccentric soft tissue mass in the low right hemipelvis centered on the cervix and vagina, measuring 2.9 x 1.9 cm. 2. New small volume  fluid within the endometrial cavity, abnormal in the postmenopausal setting and suggesting obstruction of the cervical os by soft tissue mass. 3. No evidence of lymphadenopathy or distant metastatic disease in the abdomen or pelvis.   Aortic Atherosclerosis (ICD10-I70.0).   12/24/2020 Imaging   1. Abnormal hypodense masslike appearance continuous with hypodense masslike lesion probably centrally within a right eccentric cervix (versus right paracervical mass). The endometrial portion has enlarged compared to 10/14/2020 and active malignancy is suspected. 2. No adenopathy or findings of distant spread. 3. Other imaging findings of potential clinical significance: Aortic Atherosclerosis (ICD10-I70.0). Mild cardiomegaly. Stable scarring in the left lower  lobe. Sigmoid colon scattered diverticula. Tarlov cysts at the S2 level. Vertical sclerosis in the sacral ala possibly due to stress fractures or prior radiation therapy.     01/06/2021 -  Chemotherapy    Patient is on Treatment Plan: UTERINE PEMBROLIZUMAB Q21D       04/07/2021 Imaging   1. The previously hypermetabolic right vaginal mass currently measures 12 cubic cm in volume, previously 11 cubic cm on 12/23/2020. 2. No new adenopathy or new findings of distant metastatic spread. 3. New wall thickening in several loops of distal ileum suspicious for inflammation/enteritis. 4. Other imaging findings of potential clinical significance: Mild cardiomegaly. Mild sigmoid colon diverticulosis. Stable bony demineralization with vertical sclerosis in the sacral ala likely from old insufficiency fracture.     REVIEW OF SYSTEMS:   Constitutional: Denies fevers, chills or abnormal weight loss Eyes: Denies blurriness of vision Ears, nose, mouth, throat, and face: Denies mucositis or sore throat Respiratory: Denies cough, dyspnea or wheezes Cardiovascular: Denies palpitation, chest discomfort or lower extremity swelling Gastrointestinal:  Denies nausea, heartburn or change in bowel habits Skin: Denies abnormal skin rashes Lymphatics: Denies new lymphadenopathy or easy bruising Neurological:Denies numbness, tingling or new weaknesses Behavioral/Psych: Mood is stable, no new changes  All other systems were reviewed with the patient and are negative.  I have reviewed the past medical history, past surgical history, social history and family history with the patient and they are unchanged from previous note.  ALLERGIES:  has No Known Allergies.  MEDICATIONS:  Current Outpatient Medications  Medication Sig Dispense Refill   Multiple Vitamin (MULTIVITAMIN) tablet Take 1 tablet by mouth daily.     ondansetron (ZOFRAN) 8 MG tablet Take 1 tablet (8 mg total) by mouth every 8 (eight) hours as needed  for refractory nausea / vomiting. Start on day 3 after carboplatin chemo. 30 tablet 1   prochlorperazine (COMPAZINE) 10 MG tablet Take 1 tablet (10 mg total) by mouth every 6 (six) hours as needed (Nausea or vomiting). 30 tablet 1   No current facility-administered medications for this visit.   Facility-Administered Medications Ordered in Other Visits  Medication Dose Route Frequency Provider Last Rate Last Admin   cyanocobalamin ((VITAMIN B-12)) injection 1,000 mcg  1,000 mcg Intramuscular Once Alvy Bimler, Al Bracewell, MD       diphenhydrAMINE (BENADRYL) capsule 25 mg  25 mg Oral Once Alvy Bimler, Kyser Wandel, MD       pembrolizumab (KEYTRUDA) 200 mg in sodium chloride 0.9 % 50 mL chemo infusion  200 mg Intravenous Once Alvy Bimler, Coni Homesley, MD        PHYSICAL EXAMINATION: ECOG PERFORMANCE STATUS: 0 - Asymptomatic  Vitals:   04/10/21 0757  BP: 129/72  Pulse: 82  Resp: 18  SpO2: 100%   Filed Weights   04/10/21 0757  Weight: 90 lb (40.8 kg)    GENERAL:alert, no distress and comfortable SKIN: skin color,  texture, turgor are normal, no rashes or significant lesions EYES: normal, Conjunctiva are pink and non-injected, sclera clear OROPHARYNX:no exudate, no erythema and lips, buccal mucosa, and tongue normal  NECK: supple, thyroid normal size, non-tender, without nodularity LYMPH:  no palpable lymphadenopathy in the cervical, axillary or inguinal LUNGS: clear to auscultation and percussion with normal breathing effort HEART: regular rate & rhythm and no murmurs and no lower extremity edema ABDOMEN:abdomen soft, non-tender and normal bowel sounds Musculoskeletal:no cyanosis of digits and no clubbing  NEURO: alert & oriented x 3 with fluent speech, no focal motor/sensory deficits  LABORATORY DATA:  I have reviewed the data as listed    Component Value Date/Time   NA 139 04/10/2021 0725   K 4.0 04/10/2021 0725   CL 104 04/10/2021 0725   CO2 26 04/10/2021 0725   GLUCOSE 91 04/10/2021 0725   BUN 13 04/10/2021  0725   CREATININE 0.80 04/10/2021 0725   CALCIUM 9.2 04/10/2021 0725   PROT 7.4 04/10/2021 0725   ALBUMIN 3.9 04/10/2021 0725   AST 16 04/10/2021 0725   ALT 6 04/10/2021 0725   ALKPHOS 75 04/10/2021 0725   BILITOT 0.4 04/10/2021 0725   GFRNONAA >60 04/10/2021 0725   GFRAA >60 06/14/2020 0844   GFRAA >60 03/11/2020 0951    No results found for: SPEP, UPEP  Lab Results  Component Value Date   WBC 5.2 04/10/2021   NEUTROABS 3.7 04/10/2021   HGB 11.7 (L) 04/10/2021   HCT 36.0 04/10/2021   MCV 93.5 04/10/2021   PLT 192 04/10/2021      Chemistry      Component Value Date/Time   NA 139 04/10/2021 0725   K 4.0 04/10/2021 0725   CL 104 04/10/2021 0725   CO2 26 04/10/2021 0725   BUN 13 04/10/2021 0725   CREATININE 0.80 04/10/2021 0725      Component Value Date/Time   CALCIUM 9.2 04/10/2021 0725   ALKPHOS 75 04/10/2021 0725   AST 16 04/10/2021 0725   ALT 6 04/10/2021 0725   BILITOT 0.4 04/10/2021 0725       RADIOGRAPHIC STUDIES: I have reviewed imaging study with the patient I have personally reviewed the radiological images as listed and agreed with the findings in the report. CT CHEST ABDOMEN PELVIS W CONTRAST  Result Date: 04/07/2021 CLINICAL DATA:  Cervical cancer diagnosed in 2019, restaging assessment. Chemotherapy and radiation therapy. EXAM: CT CHEST, ABDOMEN, AND PELVIS WITH CONTRAST TECHNIQUE: Multidetector CT imaging of the chest, abdomen and pelvis was performed following the standard protocol during bolus administration of intravenous contrast. CONTRAST:  76m OMNIPAQUE IOHEXOL 350 MG/ML SOLN COMPARISON:  Multiple exams, including 12/23/2020 and PET-CT from 03/29/2020 FINDINGS: CT CHEST FINDINGS Cardiovascular: Mild thoracic aortic atherosclerotic calcification. Mild cardiomegaly. Mediastinum/Nodes: Unremarkable Lungs/Pleura: Mild biapical pleuroparenchymal scarring, right greater than left. Mild scarring in the lingula and left lower lobe. Musculoskeletal:  Unremarkable CT ABDOMEN PELVIS FINDINGS Hepatobiliary: Unremarkable Pancreas: Unremarkable Spleen: Unremarkable Adrenals/Urinary Tract: Unremarkable Stomach/Bowel: Mild sigmoid colon diverticulosis. Wall thickening in the distal ileum suggesting distal enteritis. Vascular/Lymphatic: Aortoiliac atherosclerotic vascular disease. No pathologic adenopathy is identified. Reproductive: Residual hypodensity along the endometrium approximately 1.6 by 1.4 cm on image 102 of series 2. Continued abnormal soft tissue density with central lucency along the right vaginal fornix/vaginal wall, overall size 3.2 by 2.3 by 3.1 cm (volume = 12 cm^3), with a lucent central portion measuring 2.1 by 1.0 by 1.9 cm. When I compare this to the 12/23/2020 exam, the abnormal enhancing right vaginal  mass previously measured 3.2 by 2.3 by 2.8 cm (volume = 11 cm^3). Other: No supplemental non-categorized findings. Musculoskeletal: Bony demineralization. Tarlov cyst eccentric to the left at the S2 level. Stable vertical sclerosis in the sacral ala likely from old insufficiency fracture. Old superior endplate compression fracture at L5, unchanged. IMPRESSION: 1. The previously hypermetabolic right vaginal mass currently measures 12 cubic cm in volume, previously 11 cubic cm on 12/23/2020. 2. No new adenopathy or new findings of distant metastatic spread. 3. New wall thickening in several loops of distal ileum suspicious for inflammation/enteritis. 4. Other imaging findings of potential clinical significance: Mild cardiomegaly. Mild sigmoid colon diverticulosis. Stable bony demineralization with vertical sclerosis in the sacral ala likely from old insufficiency fracture. Electronically Signed   By: Van Clines M.D.   On: 04/07/2021 16:26

## 2021-04-11 LAB — T4: T4, Total: 9.6 ug/dL (ref 4.5–12.0)

## 2021-05-02 ENCOUNTER — Inpatient Hospital Stay: Payer: Medicare Other

## 2021-05-02 ENCOUNTER — Other Ambulatory Visit: Payer: Self-pay

## 2021-05-02 ENCOUNTER — Encounter: Payer: Self-pay | Admitting: Hematology and Oncology

## 2021-05-02 ENCOUNTER — Inpatient Hospital Stay: Payer: Medicare Other | Attending: Hematology and Oncology

## 2021-05-02 ENCOUNTER — Inpatient Hospital Stay (HOSPITAL_BASED_OUTPATIENT_CLINIC_OR_DEPARTMENT_OTHER): Payer: Medicare Other | Admitting: Hematology and Oncology

## 2021-05-02 DIAGNOSIS — D539 Nutritional anemia, unspecified: Secondary | ICD-10-CM | POA: Diagnosis not present

## 2021-05-02 DIAGNOSIS — C539 Malignant neoplasm of cervix uteri, unspecified: Secondary | ICD-10-CM | POA: Diagnosis not present

## 2021-05-02 DIAGNOSIS — E538 Deficiency of other specified B group vitamins: Secondary | ICD-10-CM | POA: Insufficient documentation

## 2021-05-02 DIAGNOSIS — Z923 Personal history of irradiation: Secondary | ICD-10-CM | POA: Diagnosis not present

## 2021-05-02 DIAGNOSIS — I517 Cardiomegaly: Secondary | ICD-10-CM | POA: Insufficient documentation

## 2021-05-02 DIAGNOSIS — J984 Other disorders of lung: Secondary | ICD-10-CM | POA: Insufficient documentation

## 2021-05-02 DIAGNOSIS — D61818 Other pancytopenia: Secondary | ICD-10-CM

## 2021-05-02 DIAGNOSIS — I700 Atherosclerosis of aorta: Secondary | ICD-10-CM | POA: Insufficient documentation

## 2021-05-02 DIAGNOSIS — K5730 Diverticulosis of large intestine without perforation or abscess without bleeding: Secondary | ICD-10-CM | POA: Insufficient documentation

## 2021-05-02 DIAGNOSIS — Z9221 Personal history of antineoplastic chemotherapy: Secondary | ICD-10-CM | POA: Diagnosis not present

## 2021-05-02 DIAGNOSIS — Z5112 Encounter for antineoplastic immunotherapy: Secondary | ICD-10-CM | POA: Diagnosis present

## 2021-05-02 DIAGNOSIS — Z7189 Other specified counseling: Secondary | ICD-10-CM

## 2021-05-02 LAB — CMP (CANCER CENTER ONLY)
ALT: 7 U/L (ref 0–44)
AST: 16 U/L (ref 15–41)
Albumin: 3.9 g/dL (ref 3.5–5.0)
Alkaline Phosphatase: 64 U/L (ref 38–126)
Anion gap: 10 (ref 5–15)
BUN: 11 mg/dL (ref 8–23)
CO2: 26 mmol/L (ref 22–32)
Calcium: 9 mg/dL (ref 8.9–10.3)
Chloride: 105 mmol/L (ref 98–111)
Creatinine: 0.88 mg/dL (ref 0.44–1.00)
GFR, Estimated: >60 mL/min (ref >60)
Glucose, Bld: 90 mg/dL (ref 70–99)
Potassium: 3.5 mmol/L (ref 3.5–5.1)
Sodium: 141 mmol/L (ref 135–145)
Total Bilirubin: 0.3 mg/dL (ref 0.3–1.2)
Total Protein: 7.3 g/dL (ref 6.5–8.1)

## 2021-05-02 LAB — CBC WITH DIFFERENTIAL (CANCER CENTER ONLY)
Abs Immature Granulocytes: 0.03 10*3/uL (ref 0.00–0.07)
Basophils Absolute: 0 10*3/uL (ref 0.0–0.1)
Basophils Relative: 1 %
Eosinophils Absolute: 0 10*3/uL (ref 0.0–0.5)
Eosinophils Relative: 1 %
HCT: 34.1 % — ABNORMAL LOW (ref 36.0–46.0)
Hemoglobin: 11.1 g/dL — ABNORMAL LOW (ref 12.0–15.0)
Immature Granulocytes: 1 %
Lymphocytes Relative: 12 %
Lymphs Abs: 0.6 10*3/uL — ABNORMAL LOW (ref 0.7–4.0)
MCH: 30.3 pg (ref 26.0–34.0)
MCHC: 32.6 g/dL (ref 30.0–36.0)
MCV: 93.2 fL (ref 80.0–100.0)
Monocytes Absolute: 0.6 10*3/uL (ref 0.1–1.0)
Monocytes Relative: 11 %
Neutro Abs: 3.8 10*3/uL (ref 1.7–7.7)
Neutrophils Relative %: 74 %
Platelet Count: 213 10*3/uL (ref 150–400)
RBC: 3.66 MIL/uL — ABNORMAL LOW (ref 3.87–5.11)
RDW: 11.4 % — ABNORMAL LOW (ref 11.5–15.5)
WBC Count: 5.1 10*3/uL (ref 4.0–10.5)
nRBC: 0 % (ref 0.0–0.2)

## 2021-05-02 LAB — TSH: TSH: 1.591 u[IU]/mL (ref 0.308–3.960)

## 2021-05-02 MED ORDER — DIPHENHYDRAMINE HCL 25 MG PO CAPS
25.0000 mg | ORAL_CAPSULE | Freq: Once | ORAL | Status: AC
  Administered 2021-05-02: 25 mg via ORAL
  Filled 2021-05-02: qty 1

## 2021-05-02 MED ORDER — PEMBROLIZUMAB CHEMO INJECTION 100 MG/4ML
200.0000 mg | Freq: Once | INTRAVENOUS | Status: AC
Start: 2021-05-02 — End: 2021-05-02
  Administered 2021-05-02: 200 mg via INTRAVENOUS
  Filled 2021-05-02: qty 8

## 2021-05-02 MED ORDER — SODIUM CHLORIDE 0.9 % IV SOLN: Freq: Once | INTRAVENOUS | Status: AC

## 2021-05-02 MED ORDER — CYANOCOBALAMIN 1000 MCG/ML IJ SOLN
1000.0000 ug | Freq: Once | INTRAMUSCULAR | Status: AC
  Administered 2021-05-02: 1000 ug via INTRAMUSCULAR
  Filled 2021-05-02: qty 1

## 2021-05-02 NOTE — Assessment & Plan Note (Signed)
Her recent CT imaging showed no signs of disease progression She tolerated treatment very well with no side effects The plan will be to continue treatment for few more months I plan to repeat CT imaging again at the end of October

## 2021-05-02 NOTE — Patient Instructions (Signed)
[facility] MEDICAL [facility]COL[facility]  Discharge Instructions: ?Thank you for choosing [facility] to provide your oncology and hematology care.  ? ?If you have a lab appointment with the Cancer Center, please go directly to the Cancer Center and check in at the registration area. ?  ?Wear comfortable clothing and clothing appropriate for easy access to any Portacath or PICC line.  ? ?We strive to give you quality time with your provider. You may need to reschedule your appointment if you arrive late (15 or more minutes).  Arriving late affects you and other patients whose appointments are after yours.  Also, if you miss three or more appointments without notifying the office, you may be dismissed from the clinic at the provider?s discretion.    ?  ?For prescription refill requests, have your pharmacy contact our office and allow 72 hours for refills to be completed.   ? ?Today you received the following chemotherapy and/or immunotherapy agents: Keytruda ?  ?To help prevent nausea and vomiting after your treatment, we encourage you to take your nausea medication as directed. ? ?BELOW ARE SYMPTOMS THAT SHOULD BE REPORTED IMMEDIATELY: ?*FEVER GREATER THAN 100.4 F (38 ?C) OR HIGHER ?*CHILLS OR SWEATING ?*NAUSEA AND VOMITING THAT IS NOT CONTROLLED WITH YOUR NAUSEA MEDICATION ?*UNUSUAL SHORTNESS OF BREATH ?*UNUSUAL BRUISING OR BLEEDING ?*URINARY PROBLEMS (pain or burning when urinating, or frequent urination) ?*BOWEL PROBLEMS (unusual diarrhea, constipation, pain near the anus) ?TENDERNESS IN MOUTH AND THROAT WITH OR WITHOUT PRESENCE OF ULCERS (sore throat, sores in mouth, or a toothache) ?UNUSUAL RASH, SWELLING OR PAIN  ?UNUSUAL VAGINAL DISCHARGE OR ITCHING  ? ?Items with * indicate a potential emergency and should be followed up as soon as possible or go to the Emergency Department if any problems should occur. ? ?Please show the CHEMOTHERAPY ALERT CARD or IMMUNOTHERAPY ALERT CARD at check-in to the  Emergency Department and triage nurse. ? ?Should you have questions after your visit or need to cancel or reschedule your appointment, please contact [facility]  Dept: [phone]  and follow the prompts.  Office hours are 8:00 a.m. to 4:30 p.m. Monday - Friday. Please note that voicemails left after 4:00 p.m. may not be returned until the following business day.  We are closed weekends and major holidays. You have access to a nurse at all times for urgent questions. Please call the main number to the clinic Dept: [phone] and follow the prompts. ? ? ?For any non-urgent questions, you may also contact your provider using MyChart. We now offer e-Visits for anyone 18 and older to request care online for non-urgent symptoms. For details visit [email or web address]. ?  ?Also download the MyChart app! Go to the app store, search "MyChart", open the app, select [facility], and log in with your MyChart username and password. ? ?Due to Covid, a mask is required upon entering the hospital/clinic. If you do not have a mask, one will be given to you upon arrival. For doctor visits, patients may have 1 support person aged 18 or older with them. For treatment visits, patients cannot have anyone with them due to current Covid guidelines and our immunocompromised population.  ? ?

## 2021-05-02 NOTE — Assessment & Plan Note (Signed)
Her mild anemia is due to B12 deficiency and her malnutrition state Continue B12 injection as above

## 2021-05-02 NOTE — Progress Notes (Signed)
[facility] OFFICE PROGRESS NOTE  Patient Care Team: Default, Provider, MD as PCP - General  ASSESSMENT & PLAN:  Malignant neoplasm of cervix [facility]) Her recent CT imaging showed no signs of disease progression She tolerated treatment very well with no side effects The plan will be to continue treatment for few more months I plan to repeat CT imaging again at the end of October  Vitamin B12 deficiency After many B12 injections, her B12 level has improved but she is still profoundly deficient She will continue to get B12 injection with every visit  Deficiency anemia Her mild anemia is due to B12 deficiency and her malnutrition state Continue B12 injection as above  No orders of the defined types were placed in this encounter.   All questions were answered. The patient knows to call the clinic with any problems, questions or concerns. The total time spent in the appointment was 20 minutes encounter with patients including review of chart and various tests results, discussions about plan of care and coordination of care plan   [name], MD 05/02/2021 8:46 AM  INTERVAL HISTORY: Please see below for problem oriented charting. She returns for further follow-up She is doing well No new side effects from treatment so far Denies pelvic pain, vaginal bleeding or discharge No infusion reaction  SUMMARY OF ONCOLOGIC HISTORY: Oncology History Overview Note  Cancer Staging Malignant neoplasm of cervix ([place]) Staging form: Cervix Uteri, AJCC 8th Edition - Clinical: Stage IIIB (cT3b, cN1, cM0) - Signed by [name], MD on 05/04/2018  PD-L1 1%   Malignant neoplasm of cervix ([place])  03/13/2018 Imaging   US pelvis There are mobile echogenic foci within the endometrial cavity suggestive of gas. This is nonspecific in etiology however may be secondary to endometritis. Correlate for history of recent instrumentation. This obscures visualization of the endometrial tissue and  therefore a follow-up pelvic ultrasound in 2-4 weeks is recommended after resolution of the acute symptomatology if symptoms persist to further evaluate the endometrium.     03/13/2018 Initial Diagnosis   She initially presented with PMB   04/12/2018 Pathology Results   Cervix, biopsy, mass - INVASIVE SQUAMOUS CELL CARCINOMA - SEE COMMENT   04/12/2018 Surgery   PREOPERATIVE DIAGNOSIS:  Postmenopausal vaginal bleeding. POSTOPERATIVE DIAGNOSIS: The same PROCEDURE: Exam under anesthesia, pap smear, cervical mass biopsy SURGEON:  Dr. [name]   INDICATIONS: 69 y.o. yo [id number] with PMB here for exam under anesthesia.Risks of surgery were discussed with the patient including but not limited to: bleeding which may require transfusion; infection which may require antibiotics; injury to uterus or surrounding organs; need for additional procedures including laparotomy or laparoscopy; and other postoperative/anesthesia complications. Written informed consent was obtained.     FINDINGS:  An 8-week size midline uterus.  No adnexal mass palpable on exam. Normal cervix not visualized. Friable mass seen in involving the vagina at the level of the cervix obliterating the anterior and posterior fornix.   ANESTHESIA:   General INTRAVENOUS FLUIDS:  300 ml of LR ESTIMATED BLOOD LOSS: 20 ml. SPECIMENS: pap smear, cervical mass biopsy COMPLICATIONS:  None immediate.     04/25/2018 PET scan   1. Large cervical mass may invade into the myometrium in down into the vagina, maximum SUV 21.3. There is a malignant left external iliac node measuring 1.4 cm in short axis with maximum SUV 14.1. 2. Accentuated activity in the cecum and ascending colon is likely physiologic given that it has no CT correlate. Correlation with the  patient's colon cancer screening history is recommended. If screening is not up-to-date, appropriate screening should be considered. 3.  Aortic Atherosclerosis (ICD10-I70.0).     04/28/2018  Surgery   Pre-operative Diagnosis:  At least Stage 2B SCCa Cervical Cancer   Post-operative Diagnosis: At least clinical Stage 3A SCCa Cervical Cancer   Operation:  Exam under anesthesia due to intolerance for vaginal exam in office Cystoscopy due to concern for extension to posterior bladder (from anterior vaginal wall lesion)   Operative Findings:   Parametrial involvement on right  Right sided subvaginal tumor burden ~3cm. This approximates the right ureteral insertion into the bladder based on my exam during cystoscopy. Extension of disease down upper half of vagina on lateral and posterior walls. Extension of disease down to lower third of anterior vagina (versus skip lesion) thus making her Stage 3A Cystoscopy revealed patent bilateral ureteral orifices and no bladder mucosa invasion or areas of concern. Rectal exam grossly no disease.     05/04/2018 Cancer Staging   Staging form: Cervix Uteri, AJCC 8th Edition - Clinical: Stage IIIB (cT3b, cN1, cM0) - Signed by [name], MD on 05/04/2018   05/12/2018 Procedure   Successful 8 French right internal jugular vein power port placement with its tip at the SVC/RA junction   05/13/2018 - 06/24/2018 Chemotherapy   The patient had weekly cisplatin given with concurrent radiation    05/17/2018 - 07/27/2018 Radiation Therapy   Radiation treatment dates:   05/17/2018-07/27/2018   Site/dose: 1. Cervix, 1.8 Gy in 25 fractions for a total dose of 45 Gy                    2. Pelvis  Boost, 1.8 Gy in 5 fractions for a total dose of 9 Gy                    3. Cervix, 5.5 Gy in 5 fractions for a total dose of 27.5 Gy   06/03/2018 Adverse Reaction   She missed her chemo due to severe depression and was sent to the ER for suicidal ideation.  She was seen by psychiatrist.   10/27/2018 PET scan   IMPRESSION: 1. Complete metabolic response to therapy, without residual or recurrent hypermetabolic disease. 2.  Aortic Atherosclerosis (ICD10-I70.0).      11/17/2018 Imaging   Successful right IJ vein Port-A-Cath explant.   03/29/2020 PET scan   1. Hypermetabolic lesion along the right vaginal cuff is highly worrisome for recurrent cervical cancer. No evidence of distant metastatic disease. 2.  Aortic atherosclerosis (ICD10-I70.0).   04/26/2020 -  Chemotherapy   The patient had CARBOplatin  for chemotherapy treatment.      Genetic Testing   Patient has genetic testing done for PD-L1. Results revealed patient has the following:  PD-L1 combined positive score (CPS): 1%   07/08/2020 Imaging   1. Unchanged post treatment appearance of the pelvis, including a soft tissue nodule in the low right hemipelvis adjacent to the vagina measuring 2.8 x 1.7 cm, previously FDG avid and consistent with malignancy. 2. No evidence of nodal or distant metastatic disease in the abdomen or pelvis. 3. Aortic Atherosclerosis (ICD10-I70.0).   10/14/2020 Imaging   1. Unchanged right eccentric soft tissue mass in the low right hemipelvis centered on the cervix and vagina, measuring 2.9 x 1.9 cm. 2. New small volume fluid within the endometrial cavity, abnormal in the postmenopausal setting and suggesting obstruction of the cervical os by soft tissue mass. 3. No evidence  of lymphadenopathy or distant metastatic disease in the abdomen or pelvis.   Aortic Atherosclerosis (ICD10-I70.0).   12/24/2020 Imaging   1. Abnormal hypodense masslike appearance continuous with hypodense masslike lesion probably centrally within a right eccentric cervix (versus right paracervical mass). The endometrial portion has enlarged compared to 10/14/2020 and active malignancy is suspected. 2. No adenopathy or findings of distant spread. 3. Other imaging findings of potential clinical significance: Aortic Atherosclerosis (ICD10-I70.0). Mild cardiomegaly. Stable scarring in the left lower lobe. Sigmoid colon scattered diverticula. Tarlov cysts at the S2 level. Vertical sclerosis in the  sacral ala possibly due to stress fractures or prior radiation therapy.     01/06/2021 -  Chemotherapy    Patient is on Treatment Plan: UTERINE PEMBROLIZUMAB Q21D       04/07/2021 Imaging   1. The previously hypermetabolic right vaginal mass currently measures 12 cubic cm in volume, previously 11 cubic cm on 12/23/2020. 2. No new adenopathy or new findings of distant metastatic spread. 3. New wall thickening in several loops of distal ileum suspicious for inflammation/enteritis. 4. Other imaging findings of potential clinical significance: Mild cardiomegaly. Mild sigmoid colon diverticulosis. Stable bony demineralization with vertical sclerosis in the sacral ala likely from old insufficiency fracture.     REVIEW OF SYSTEMS:   Constitutional: Denies fevers, chills or abnormal weight loss Eyes: Denies blurriness of vision Ears, nose, mouth, throat, and face: Denies mucositis or sore throat Respiratory: Denies cough, dyspnea or wheezes Cardiovascular: Denies palpitation, chest discomfort or lower extremity swelling Gastrointestinal:  Denies nausea, heartburn or change in bowel habits Skin: Denies abnormal skin rashes Lymphatics: Denies new lymphadenopathy or easy bruising Neurological:Denies numbness, tingling or new weaknesses Behavioral/Psych: Mood is stable, no new changes  All other systems were reviewed with the patient and are negative.  I have reviewed the past medical history, past surgical history, social history and family history with the patient and they are unchanged from previous note.  ALLERGIES:  has No Known Allergies.  MEDICATIONS:  Current Outpatient Medications  Medication Sig Dispense Refill   Multiple Vitamin (MULTIVITAMIN) tablet Take 1 tablet by mouth daily.     ondansetron (ZOFRAN) 8 MG tablet Take 1 tablet (8 mg total) by mouth every 8 (eight) hours as needed for refractory nausea / vomiting. Start on day 3 after carboplatin chemo. 30 tablet 1    prochlorperazine (COMPAZINE) 10 MG tablet Take 1 tablet (10 mg total) by mouth every 6 (six) hours as needed (Nausea or vomiting). 30 tablet 1   No current facility-administered medications for this visit.    PHYSICAL EXAMINATION: ECOG PERFORMANCE STATUS: 0 - Asymptomatic  Vitals:   05/02/21 0758  BP: 132/72  Pulse: 88  Resp: 18  Temp: 98.8 F (37.1 C)  SpO2: 99%   Filed Weights   05/02/21 0758  Weight: 90 lb 9.6 oz (41.1 kg)    GENERAL:alert, no distress and comfortable SKIN: skin color, texture, turgor are normal, no rashes or significant lesions EYES: normal, Conjunctiva are pink and non-injected, sclera clear OROPHARYNX:no exudate, no erythema and lips, buccal mucosa, and tongue normal  NECK: supple, thyroid normal size, non-tender, without nodularity LYMPH:  no palpable lymphadenopathy in the cervical, axillary or inguinal LUNGS: clear to auscultation and percussion with normal breathing effort HEART: regular rate & rhythm and no murmurs and no lower extremity edema ABDOMEN:abdomen soft, non-tender and normal bowel sounds Musculoskeletal:no cyanosis of digits and no clubbing  NEURO: alert & oriented x 3 with fluent speech, no focal motor/sensory deficits  LABORATORY DATA:  I have reviewed the data as listed    Component Value Date/Time   NA 141 05/02/2021 0731   K 3.5 05/02/2021 0731   CL 105 05/02/2021 0731   CO2 26 05/02/2021 0731   GLUCOSE 90 05/02/2021 0731   BUN 11 05/02/2021 0731   CREATININE 0.88 05/02/2021 0731   CALCIUM 9.0 05/02/2021 0731   PROT 7.3 05/02/2021 0731   ALBUMIN 3.9 05/02/2021 0731   AST 16 05/02/2021 0731   ALT 7 05/02/2021 0731   ALKPHOS 64 05/02/2021 0731   BILITOT 0.3 05/02/2021 0731   GFRNONAA >60 05/02/2021 0731   GFRAA >60 06/14/2020 0844   GFRAA >60 03/11/2020 0951    No results found for: SPEP, UPEP  Lab Results  Component Value Date   WBC 5.1 05/02/2021   NEUTROABS 3.8 05/02/2021   HGB 11.1 (L) 05/02/2021   HCT 34.1  (L) 05/02/2021   MCV 93.2 05/02/2021   PLT 213 05/02/2021      Chemistry      Component Value Date/Time   NA 141 05/02/2021 0731   K 3.5 05/02/2021 0731   CL 105 05/02/2021 0731   CO2 26 05/02/2021 0731   BUN 11 05/02/2021 0731   CREATININE 0.88 05/02/2021 0731      Component Value Date/Time   CALCIUM 9.0 05/02/2021 0731   ALKPHOS 64 05/02/2021 0731   AST 16 05/02/2021 0731   ALT 7 05/02/2021 0731   BILITOT 0.3 05/02/2021 0731

## 2021-05-02 NOTE — Assessment & Plan Note (Signed)
After many B12 injections, her B12 level has improved but she is still profoundly deficient She will continue to get B12 injection with every visit

## 2021-05-03 LAB — T4: T4, Total: 8.8 ug/dL (ref 4.5–12.0)

## 2021-05-23 ENCOUNTER — Inpatient Hospital Stay: Payer: Medicare Other | Attending: Hematology and Oncology

## 2021-05-23 ENCOUNTER — Other Ambulatory Visit: Payer: Self-pay

## 2021-05-23 ENCOUNTER — Inpatient Hospital Stay: Payer: Medicare Other

## 2021-05-23 ENCOUNTER — Other Ambulatory Visit: Payer: Self-pay | Admitting: Hematology and Oncology

## 2021-05-23 ENCOUNTER — Encounter: Payer: Self-pay | Admitting: Hematology and Oncology

## 2021-05-23 ENCOUNTER — Inpatient Hospital Stay (HOSPITAL_BASED_OUTPATIENT_CLINIC_OR_DEPARTMENT_OTHER): Payer: Medicare Other | Admitting: Hematology and Oncology

## 2021-05-23 DIAGNOSIS — D61818 Other pancytopenia: Secondary | ICD-10-CM

## 2021-05-23 DIAGNOSIS — C539 Malignant neoplasm of cervix uteri, unspecified: Secondary | ICD-10-CM

## 2021-05-23 DIAGNOSIS — E538 Deficiency of other specified B group vitamins: Secondary | ICD-10-CM | POA: Insufficient documentation

## 2021-05-23 DIAGNOSIS — C569 Malignant neoplasm of unspecified ovary: Secondary | ICD-10-CM | POA: Insufficient documentation

## 2021-05-23 DIAGNOSIS — Z923 Personal history of irradiation: Secondary | ICD-10-CM | POA: Diagnosis not present

## 2021-05-23 DIAGNOSIS — Z5112 Encounter for antineoplastic immunotherapy: Secondary | ICD-10-CM | POA: Insufficient documentation

## 2021-05-23 DIAGNOSIS — K5730 Diverticulosis of large intestine without perforation or abscess without bleeding: Secondary | ICD-10-CM | POA: Diagnosis not present

## 2021-05-23 DIAGNOSIS — I517 Cardiomegaly: Secondary | ICD-10-CM | POA: Diagnosis not present

## 2021-05-23 DIAGNOSIS — R64 Cachexia: Secondary | ICD-10-CM | POA: Insufficient documentation

## 2021-05-23 DIAGNOSIS — I700 Atherosclerosis of aorta: Secondary | ICD-10-CM | POA: Insufficient documentation

## 2021-05-23 DIAGNOSIS — Z79899 Other long term (current) drug therapy: Secondary | ICD-10-CM | POA: Insufficient documentation

## 2021-05-23 DIAGNOSIS — E039 Hypothyroidism, unspecified: Secondary | ICD-10-CM | POA: Diagnosis not present

## 2021-05-23 DIAGNOSIS — Z7189 Other specified counseling: Secondary | ICD-10-CM

## 2021-05-23 DIAGNOSIS — Z9221 Personal history of antineoplastic chemotherapy: Secondary | ICD-10-CM | POA: Diagnosis not present

## 2021-05-23 LAB — CMP (CANCER CENTER ONLY)
ALT: 12 U/L (ref 0–44)
AST: 21 U/L (ref 15–41)
Albumin: 3.9 g/dL (ref 3.5–5.0)
Alkaline Phosphatase: 63 U/L (ref 38–126)
Anion gap: 13 (ref 5–15)
BUN: 12 mg/dL (ref 8–23)
CO2: 25 mmol/L (ref 22–32)
Calcium: 9.1 mg/dL (ref 8.9–10.3)
Chloride: 105 mmol/L (ref 98–111)
Creatinine: 1.01 mg/dL — ABNORMAL HIGH (ref 0.44–1.00)
GFR, Estimated: >60 mL/min (ref >60)
Glucose, Bld: 83 mg/dL (ref 70–99)
Potassium: 3.3 mmol/L — ABNORMAL LOW (ref 3.5–5.1)
Sodium: 143 mmol/L (ref 135–145)
Total Bilirubin: 0.4 mg/dL (ref 0.3–1.2)
Total Protein: 7.2 g/dL (ref 6.5–8.1)

## 2021-05-23 LAB — CBC WITH DIFFERENTIAL (CANCER CENTER ONLY)
Abs Immature Granulocytes: 0.02 10*3/uL (ref 0.00–0.07)
Basophils Absolute: 0 10*3/uL (ref 0.0–0.1)
Basophils Relative: 1 %
Eosinophils Absolute: 0.1 10*3/uL (ref 0.0–0.5)
Eosinophils Relative: 2 %
HCT: 35.5 % — ABNORMAL LOW (ref 36.0–46.0)
Hemoglobin: 11.7 g/dL — ABNORMAL LOW (ref 12.0–15.0)
Immature Granulocytes: 0 %
Lymphocytes Relative: 18 %
Lymphs Abs: 0.9 10*3/uL (ref 0.7–4.0)
MCH: 29.8 pg (ref 26.0–34.0)
MCHC: 33 g/dL (ref 30.0–36.0)
MCV: 90.6 fL (ref 80.0–100.0)
Monocytes Absolute: 0.4 10*3/uL (ref 0.1–1.0)
Monocytes Relative: 8 %
Neutro Abs: 3.4 10*3/uL (ref 1.7–7.7)
Neutrophils Relative %: 71 %
Platelet Count: 177 10*3/uL (ref 150–400)
RBC: 3.92 MIL/uL (ref 3.87–5.11)
RDW: 12.5 % (ref 11.5–15.5)
WBC Count: 4.8 10*3/uL (ref 4.0–10.5)
nRBC: 0 % (ref 0.0–0.2)

## 2021-05-23 LAB — TSH: TSH: 148.036 u[IU]/mL — ABNORMAL HIGH (ref 0.308–3.960)

## 2021-05-23 MED ORDER — CYANOCOBALAMIN 1000 MCG/ML IJ SOLN
1000.0000 ug | Freq: Once | INTRAMUSCULAR | Status: AC
  Administered 2021-05-23: 1000 ug via INTRAMUSCULAR
  Filled 2021-05-23: qty 1

## 2021-05-23 MED ORDER — LEVOTHYROXINE SODIUM 25 MCG PO TABS: 25.0000 ug | ORAL_TABLET | Freq: Every day | ORAL | 3 refills | Status: DC

## 2021-05-23 MED ORDER — SODIUM CHLORIDE 0.9 % IV SOLN: Freq: Once | INTRAVENOUS | Status: AC

## 2021-05-23 MED ORDER — DIPHENHYDRAMINE HCL 25 MG PO CAPS
25.0000 mg | ORAL_CAPSULE | Freq: Once | ORAL | Status: AC
  Administered 2021-05-23: 25 mg via ORAL
  Filled 2021-05-23: qty 1

## 2021-05-23 MED ORDER — SODIUM CHLORIDE 0.9 % IV SOLN
200.0000 mg | Freq: Once | INTRAVENOUS | Status: AC
  Administered 2021-05-23: 200 mg via INTRAVENOUS
  Filled 2021-05-23: qty 8

## 2021-05-23 NOTE — Assessment & Plan Note (Signed)
Her recent CT imaging showed no signs of disease progression She tolerated treatment very well with no side effects The plan will be to continue treatment for few more months I plan to repeat CT imaging again at the end of October

## 2021-05-23 NOTE — Assessment & Plan Note (Signed)
For now, she is encouraged to increase oral intake as tolerated

## 2021-05-23 NOTE — Assessment & Plan Note (Signed)
Her TSH subsequently came back abnormally high The patient will be started on Synthroid

## 2021-05-23 NOTE — Progress Notes (Addendum)
[place] OFFICE PROGRESS NOTE  Patient Care Team: Default, Provider, MD as PCP - General  ASSESSMENT & PLAN:  Malignant neoplasm of cervix [facility]) Her recent CT imaging showed no signs of disease progression She tolerated treatment very well with no side effects The plan will be to continue treatment for few more months I plan to repeat CT imaging again at the end of October  Vitamin B12 deficiency After many B12 injections, her B12 level has improved but she is still profoundly deficient She will continue to get B12 injection with every visit, plan to recheck B12 level at the end of Oct  [facility]) For now, she is encouraged to increase oral intake as tolerated  Acquired hypothyroidism Her TSH subsequently came back abnormally high The patient will be started on Synthroid  Orders Placed This Encounter  Procedures   Vitamin B12    Standing Status:   Future    Standing Expiration Date:   05/23/2022    All questions were answered. The patient knows to call the clinic with any problems, questions or concerns. The total time spent in the appointment was 20 minutes encounter with patients including review of chart and various tests results, discussions about plan of care and coordination of care plan   [name], MD 05/23/2021 9:52 AM  INTERVAL HISTORY: Please see below for problem oriented charting. she returns for treatment follow-up for cervical cancer on pembrolizumab She also have vitamin B12 deficiency on B12 injections She is doing well No pelvic pain or discharge Her appetite is fair and she has not lost any weight Overall, she tolerated treatment without any side effects  REVIEW OF SYSTEMS:   Constitutional: Denies fevers, chills or abnormal weight loss Eyes: Denies blurriness of vision Ears, nose, mouth, throat, and face: Denies mucositis or sore throat Respiratory: Denies cough, dyspnea or wheezes Cardiovascular: Denies palpitation, chest  discomfort or lower extremity swelling Gastrointestinal:  Denies nausea, heartburn or change in bowel habits Skin: Denies abnormal skin rashes Lymphatics: Denies new lymphadenopathy or easy bruising Neurological:Denies numbness, tingling or new weaknesses Behavioral/Psych: Mood is stable, no new changes  All other systems were reviewed with the patient and are negative.  I have reviewed the past medical history, past surgical history, social history and family history with the patient and they are unchanged from previous note.  ALLERGIES:  has No Known Allergies.  MEDICATIONS:  Current Outpatient Medications  Medication Sig Dispense Refill   levothyroxine (SYNTHROID) 25 MCG tablet Take 1 tablet (25 mcg total) by mouth daily before breakfast. 30 tablet 3   Multiple Vitamin (MULTIVITAMIN) tablet Take 1 tablet by mouth daily.     ondansetron (ZOFRAN) 8 MG tablet Take 1 tablet (8 mg total) by mouth every 8 (eight) hours as needed for refractory nausea / vomiting. Start on day 3 after carboplatin chemo. 30 tablet 1   prochlorperazine (COMPAZINE) 10 MG tablet Take 1 tablet (10 mg total) by mouth every 6 (six) hours as needed (Nausea or vomiting). 30 tablet 1   No current facility-administered medications for this visit.   Facility-Administered Medications Ordered in Other Visits  Medication Dose Route Frequency Provider Last Rate Last Admin   pembrolizumab (KEYTRUDA) 200 mg in sodium chloride 0.9 % 50 mL chemo infusion  200 mg Intravenous Once [name], MD 116 mL/hr at 05/23/21 0934 200 mg at 05/23/21 0934    SUMMARY OF ONCOLOGIC HISTORY: Oncology History Overview Note  Cancer Staging Malignant neoplasm of cervix [facility]) Staging form:  Cervix Uteri, AJCC 8th Edition - Clinical: Stage IIIB (cT3b, cN1, cM0) - Signed by Artis Delay, MD on 05/04/2018  PD-L1 1%   Malignant neoplasm of cervix (HCC)  03/13/2018 Imaging   US pelvis There are mobile echogenic foci within the endometrial cavity  suggestive of gas. This is nonspecific in etiology however may be secondary to endometritis. Correlate for history of recent instrumentation. This obscures visualization of the endometrial tissue and therefore a follow-up pelvic ultrasound in 2-4 weeks is recommended after resolution of the acute symptomatology if symptoms persist to further evaluate the endometrium.     03/13/2018 Initial Diagnosis   She initially presented with PMB   04/12/2018 Pathology Results   Cervix, biopsy, mass - INVASIVE SQUAMOUS CELL CARCINOMA - SEE COMMENT   04/12/2018 Surgery   PREOPERATIVE DIAGNOSIS:  Postmenopausal vaginal bleeding. POSTOPERATIVE DIAGNOSIS: The same PROCEDURE: Exam under anesthesia, pap smear, cervical mass biopsy SURGEON:  Dr. Catalina Antigua   INDICATIONS: 70 y.o. yo [id number] with PMB here for exam under anesthesia.Risks of surgery were discussed with the patient including but not limited to: bleeding which may require transfusion; infection which may require antibiotics; injury to uterus or surrounding organs; need for additional procedures including laparotomy or laparoscopy; and other postoperative/anesthesia complications. Written informed consent was obtained.     FINDINGS:  An 8-week size midline uterus.  No adnexal mass palpable on exam. Normal cervix not visualized. Friable mass seen in involving the vagina at the level of the cervix obliterating the anterior and posterior fornix.   ANESTHESIA:   General INTRAVENOUS FLUIDS:  300 ml of LR ESTIMATED BLOOD LOSS: 20 ml. SPECIMENS: pap smear, cervical mass biopsy COMPLICATIONS:  None immediate.     04/25/2018 PET scan   1. Large cervical mass may invade into the myometrium in down into the vagina, maximum SUV 21.3. There is a malignant left external iliac node measuring 1.4 cm in short axis with maximum SUV 14.1. 2. Accentuated activity in the cecum and ascending colon is likely physiologic given that it has no CT correlate. Correlation  with the patient's colon cancer screening history is recommended. If screening is not up-to-date, appropriate screening should be considered. 3.  Aortic Atherosclerosis (ICD10-I70.0).     04/28/2018 Surgery   Pre-operative Diagnosis:  At least Stage 2B SCCa Cervical Cancer   Post-operative Diagnosis: At least clinical Stage 3A SCCa Cervical Cancer   Operation:  Exam under anesthesia due to intolerance for vaginal exam in office Cystoscopy due to concern for extension to posterior bladder (from anterior vaginal wall lesion)   Operative Findings:   Parametrial involvement on right  Right sided subvaginal tumor burden ~3cm. This approximates the right ureteral insertion into the bladder based on my exam during cystoscopy. Extension of disease down upper half of vagina on lateral and posterior walls. Extension of disease down to lower third of anterior vagina (versus skip lesion) thus making her Stage 3A Cystoscopy revealed patent bilateral ureteral orifices and no bladder mucosa invasion or areas of concern. Rectal exam grossly no disease.     05/04/2018 Cancer Staging   Staging form: Cervix Uteri, AJCC 8th Edition - Clinical: Stage IIIB (cT3b, cN1, cM0) - Signed by Artis Delay, MD on 05/04/2018   05/12/2018 Procedure   Successful 8 French right internal jugular vein power port placement with its tip at the SVC/RA junction   05/13/2018 - 06/24/2018 Chemotherapy   The patient had weekly cisplatin given with concurrent radiation    05/17/2018 - 07/27/2018 Radiation Therapy  Radiation treatment dates:   05/17/2018-07/27/2018   Site/dose: 1. Cervix, 1.8 Gy in 25 fractions for a total dose of 45 Gy                    2. Pelvis  Boost, 1.8 Gy in 5 fractions for a total dose of 9 Gy                    3. Cervix, 5.5 Gy in 5 fractions for a total dose of 27.5 Gy   06/03/2018 Adverse Reaction   She missed her chemo due to severe depression and was sent to the ER for suicidal ideation.  She was  seen by psychiatrist.   10/27/2018 PET scan   IMPRESSION: 1. Complete metabolic response to therapy, without residual or recurrent hypermetabolic disease. 2.  Aortic Atherosclerosis (ICD10-I70.0).     11/17/2018 Imaging   Successful right IJ vein Port-A-Cath explant.   03/29/2020 PET scan   1. Hypermetabolic lesion along the right vaginal cuff is highly worrisome for recurrent cervical cancer. No evidence of distant metastatic disease. 2.  Aortic atherosclerosis (ICD10-I70.0).   04/26/2020 -  Chemotherapy   The patient had CARBOplatin  for chemotherapy treatment.      Genetic Testing   Patient has genetic testing done for PD-L1. Results revealed patient has the following:  PD-L1 combined positive score (CPS): 1%   07/08/2020 Imaging   1. Unchanged post treatment appearance of the pelvis, including a soft tissue nodule in the low right hemipelvis adjacent to the vagina measuring 2.8 x 1.7 cm, previously FDG avid and consistent with malignancy. 2. No evidence of nodal or distant metastatic disease in the abdomen or pelvis. 3. Aortic Atherosclerosis (ICD10-I70.0).   10/14/2020 Imaging   1. Unchanged right eccentric soft tissue mass in the low right hemipelvis centered on the cervix and vagina, measuring 2.9 x 1.9 cm. 2. New small volume fluid within the endometrial cavity, abnormal in the postmenopausal setting and suggesting obstruction of the cervical os by soft tissue mass. 3. No evidence of lymphadenopathy or distant metastatic disease in the abdomen or pelvis.   Aortic Atherosclerosis (ICD10-I70.0).   12/24/2020 Imaging   1. Abnormal hypodense masslike appearance continuous with hypodense masslike lesion probably centrally within a right eccentric cervix (versus right paracervical mass). The endometrial portion has enlarged compared to 10/14/2020 and active malignancy is suspected. 2. No adenopathy or findings of distant spread. 3. Other imaging findings of potential clinical  significance: Aortic Atherosclerosis (ICD10-I70.0). Mild cardiomegaly. Stable scarring in the left lower lobe. Sigmoid colon scattered diverticula. Tarlov cysts at the S2 level. Vertical sclerosis in the sacral ala possibly due to stress fractures or prior radiation therapy.     01/06/2021 -  Chemotherapy    Patient is on Treatment Plan: UTERINE PEMBROLIZUMAB Q21D       04/07/2021 Imaging   1. The previously hypermetabolic right vaginal mass currently measures 12 cubic cm in volume, previously 11 cubic cm on 12/23/2020. 2. No new adenopathy or new findings of distant metastatic spread. 3. New wall thickening in several loops of distal ileum suspicious for inflammation/enteritis. 4. Other imaging findings of potential clinical significance: Mild cardiomegaly. Mild sigmoid colon diverticulosis. Stable bony demineralization with vertical sclerosis in the sacral ala likely from old insufficiency fracture.     PHYSICAL EXAMINATION: ECOG PERFORMANCE STATUS: 0 - Asymptomatic  Vitals:   05/23/21 0804  BP: 138/84  Pulse: 75  Resp: 18  Temp: 98.6 F (37 C)  SpO2: 99%   Filed Weights   05/23/21 0804  Weight: 91 lb 3.2 oz (41.4 kg)    GENERAL:alert, no distress and comfortable SKIN: skin color, texture, turgor are normal, no rashes or significant lesions EYES: normal, Conjunctiva are pink and non-injected, sclera clear OROPHARYNX:no exudate, no erythema and lips, buccal mucosa, and tongue normal  NECK: supple, thyroid normal size, non-tender, without nodularity LYMPH:  no palpable lymphadenopathy in the cervical, axillary or inguinal LUNGS: clear to auscultation and percussion with normal breathing effort HEART: regular rate & rhythm and no murmurs and no lower extremity edema ABDOMEN:abdomen soft, non-tender and normal bowel sounds Musculoskeletal:no cyanosis of digits and no clubbing  NEURO: alert & oriented x 3 with fluent speech, no focal motor/sensory deficits  LABORATORY DATA:   I have reviewed the data as listed    Component Value Date/Time   NA 143 05/23/2021 0736   K 3.3 (L) 05/23/2021 0736   CL 105 05/23/2021 0736   CO2 25 05/23/2021 0736   GLUCOSE 83 05/23/2021 0736   BUN 12 05/23/2021 0736   CREATININE 1.01 (H) 05/23/2021 0736   CALCIUM 9.1 05/23/2021 0736   PROT 7.2 05/23/2021 0736   ALBUMIN 3.9 05/23/2021 0736   AST 21 05/23/2021 0736   ALT 12 05/23/2021 0736   ALKPHOS 63 05/23/2021 0736   BILITOT 0.4 05/23/2021 0736   GFRNONAA >60 05/23/2021 0736   GFRAA >60 06/14/2020 0844   GFRAA >60 03/11/2020 0951    No results found for: SPEP, UPEP  Lab Results  Component Value Date   WBC 4.8 05/23/2021   NEUTROABS 3.4 05/23/2021   HGB 11.7 (L) 05/23/2021   HCT 35.5 (L) 05/23/2021   MCV 90.6 05/23/2021   PLT 177 05/23/2021      Chemistry      Component Value Date/Time   NA 143 05/23/2021 0736   K 3.3 (L) 05/23/2021 0736   CL 105 05/23/2021 0736   CO2 25 05/23/2021 0736   BUN 12 05/23/2021 0736   CREATININE 1.01 (H) 05/23/2021 0736      Component Value Date/Time   CALCIUM 9.1 05/23/2021 0736   ALKPHOS 63 05/23/2021 0736   AST 21 05/23/2021 0736   ALT 12 05/23/2021 0736   BILITOT 0.4 05/23/2021 0736

## 2021-05-23 NOTE — Patient Instructions (Signed)
[facility] MEDICAL ONCOLOGY  Discharge Instructions: ?Thank you for choosing [facility] to provide your oncology and hematology care.  ? ?If you have a lab appointment with the Cancer Center, please go directly to the Cancer Center and check in at the registration area. ?  ?Wear comfortable clothing and clothing appropriate for easy access to any Portacath or PICC line.  ? ?We strive to give you quality time with your provider. You may need to reschedule your appointment if you arrive late (15 or more minutes).  Arriving late affects you and other patients whose appointments are after yours.  Also, if you miss three or more appointments without notifying the office, you may be dismissed from the clinic at the provider?s discretion.    ?  ?For prescription refill requests, have your pharmacy contact our office and allow 72 hours for refills to be completed.   ? ?Today you received the following chemotherapy and/or immunotherapy agents: Keytruda ?  ?To help prevent nausea and vomiting after your treatment, we encourage you to take your nausea medication as directed. ? ?BELOW ARE SYMPTOMS THAT SHOULD BE REPORTED IMMEDIATELY: ?*FEVER GREATER THAN 100.4 F (38 ?C) OR HIGHER ?*CHILLS OR SWEATING ?*NAUSEA AND VOMITING THAT IS NOT CONTROLLED WITH YOUR NAUSEA MEDICATION ?*UNUSUAL SHORTNESS OF BREATH ?*UNUSUAL BRUISING OR BLEEDING ?*URINARY PROBLEMS (pain or burning when urinating, or frequent urination) ?*BOWEL PROBLEMS (unusual diarrhea, constipation, pain near the anus) ?TENDERNESS IN MOUTH AND THROAT WITH OR WITHOUT PRESENCE OF ULCERS (sore throat, sores in mouth, or a toothache) ?UNUSUAL RASH, SWELLING OR PAIN  ?UNUSUAL VAGINAL DISCHARGE OR ITCHING  ? ?Items with * indicate a potential emergency and should be followed up as soon as possible or go to the Emergency Department if any problems should occur. ? ?Please show the CHEMOTHERAPY ALERT CARD or IMMUNOTHERAPY ALERT CARD at check-in to the  Emergency Department and triage nurse. ? ?Should you have questions after your visit or need to cancel or reschedule your appointment, please contact [facility]  Dept: [phone]  and follow the prompts.  Office hours are 8:00 a.m. to 4:30 p.m. Monday - Friday. Please note that voicemails left after 4:00 p.m. may not be returned until the following business day.  We are closed weekends and major holidays. You have access to a nurse at all times for urgent questions. Please call the main number to the clinic Dept: [phone] and follow the prompts. ? ? ?For any non-urgent questions, you may also contact your provider using MyChart. We now offer e-Visits for anyone 18 and older to request care online for non-urgent symptoms. For details visit [email or web address]. ?  ?Also download the MyChart app! Go to the app store, search "MyChart", open the app, select [facility], and log in with your MyChart username and password. ? ?Due to Covid, a mask is required upon entering the hospital/clinic. If you do not have a mask, one will be given to you upon arrival. For doctor visits, patients may have 1 support person aged 18 or older with them. For treatment visits, patients cannot have anyone with them due to current Covid guidelines and our immunocompromised population.  ? ?

## 2021-05-23 NOTE — Addendum Note (Signed)
Addended byAlvy Bimler, Phoebie Shad on: 05/23/2021 09:53 AM   Modules accepted: Orders

## 2021-05-23 NOTE — Assessment & Plan Note (Signed)
After many B12 injections, her B12 level has improved but she is still profoundly deficient She will continue to get B12 injection with every visit, plan to recheck B12 level at the end of Oct

## 2021-05-24 LAB — T4: T4, Total: 2 ug/dL — ABNORMAL LOW (ref 4.5–12.0)

## 2021-06-19 ENCOUNTER — Encounter: Payer: Self-pay | Admitting: Hematology and Oncology

## 2021-06-19 ENCOUNTER — Other Ambulatory Visit: Payer: Self-pay

## 2021-06-19 ENCOUNTER — Ambulatory Visit: Payer: Medicare Other

## 2021-06-19 ENCOUNTER — Inpatient Hospital Stay: Payer: Medicare Other | Attending: Hematology and Oncology

## 2021-06-19 ENCOUNTER — Inpatient Hospital Stay (HOSPITAL_BASED_OUTPATIENT_CLINIC_OR_DEPARTMENT_OTHER): Payer: Medicare Other | Admitting: Hematology and Oncology

## 2021-06-19 VITALS — BP 156/92 | HR 80 | Temp 98.0°F | Resp 18 | Ht 62.0 in | Wt 91.2 lb

## 2021-06-19 DIAGNOSIS — E876 Hypokalemia: Secondary | ICD-10-CM

## 2021-06-19 DIAGNOSIS — K5730 Diverticulosis of large intestine without perforation or abscess without bleeding: Secondary | ICD-10-CM | POA: Insufficient documentation

## 2021-06-19 DIAGNOSIS — Z7189 Other specified counseling: Secondary | ICD-10-CM

## 2021-06-19 DIAGNOSIS — Z299 Encounter for prophylactic measures, unspecified: Secondary | ICD-10-CM

## 2021-06-19 DIAGNOSIS — E039 Hypothyroidism, unspecified: Secondary | ICD-10-CM | POA: Insufficient documentation

## 2021-06-19 DIAGNOSIS — Z5112 Encounter for antineoplastic immunotherapy: Secondary | ICD-10-CM | POA: Insufficient documentation

## 2021-06-19 DIAGNOSIS — C539 Malignant neoplasm of cervix uteri, unspecified: Secondary | ICD-10-CM | POA: Diagnosis not present

## 2021-06-19 DIAGNOSIS — I700 Atherosclerosis of aorta: Secondary | ICD-10-CM | POA: Insufficient documentation

## 2021-06-19 DIAGNOSIS — I517 Cardiomegaly: Secondary | ICD-10-CM | POA: Diagnosis not present

## 2021-06-19 DIAGNOSIS — Z23 Encounter for immunization: Secondary | ICD-10-CM | POA: Diagnosis not present

## 2021-06-19 DIAGNOSIS — J984 Other disorders of lung: Secondary | ICD-10-CM | POA: Diagnosis not present

## 2021-06-19 DIAGNOSIS — N131 Hydronephrosis with ureteral stricture, not elsewhere classified: Secondary | ICD-10-CM | POA: Diagnosis not present

## 2021-06-19 DIAGNOSIS — E538 Deficiency of other specified B group vitamins: Secondary | ICD-10-CM

## 2021-06-19 DIAGNOSIS — Z79890 Hormone replacement therapy (postmenopausal): Secondary | ICD-10-CM | POA: Insufficient documentation

## 2021-06-19 DIAGNOSIS — D61818 Other pancytopenia: Secondary | ICD-10-CM

## 2021-06-19 DIAGNOSIS — Z79899 Other long term (current) drug therapy: Secondary | ICD-10-CM | POA: Insufficient documentation

## 2021-06-19 LAB — CBC WITH DIFFERENTIAL (CANCER CENTER ONLY)
Abs Immature Granulocytes: 0.03 10*3/uL (ref 0.00–0.07)
Basophils Absolute: 0.1 10*3/uL (ref 0.0–0.1)
Basophils Relative: 1 %
Eosinophils Absolute: 0.1 10*3/uL (ref 0.0–0.5)
Eosinophils Relative: 2 %
HCT: 34.3 % — ABNORMAL LOW (ref 36.0–46.0)
Hemoglobin: 11.3 g/dL — ABNORMAL LOW (ref 12.0–15.0)
Immature Granulocytes: 1 %
Lymphocytes Relative: 19 %
Lymphs Abs: 0.8 10*3/uL (ref 0.7–4.0)
MCH: 29.3 pg (ref 26.0–34.0)
MCHC: 32.9 g/dL (ref 30.0–36.0)
MCV: 88.9 fL (ref 80.0–100.0)
Monocytes Absolute: 0.4 10*3/uL (ref 0.1–1.0)
Monocytes Relative: 10 %
Neutro Abs: 2.7 10*3/uL (ref 1.7–7.7)
Neutrophils Relative %: 67 %
Platelet Count: 169 10*3/uL (ref 150–400)
RBC: 3.86 MIL/uL — ABNORMAL LOW (ref 3.87–5.11)
RDW: 13.1 % (ref 11.5–15.5)
WBC Count: 4 10*3/uL (ref 4.0–10.5)
nRBC: 0 % (ref 0.0–0.2)

## 2021-06-19 LAB — CMP (CANCER CENTER ONLY)
ALT: 10 U/L (ref 0–44)
AST: 24 U/L (ref 15–41)
Albumin: 4.2 g/dL (ref 3.5–5.0)
Alkaline Phosphatase: 61 U/L (ref 38–126)
Anion gap: 13 (ref 5–15)
BUN: 13 mg/dL (ref 8–23)
CO2: 25 mmol/L (ref 22–32)
Calcium: 9.2 mg/dL (ref 8.9–10.3)
Chloride: 104 mmol/L (ref 98–111)
Creatinine: 1.12 mg/dL — ABNORMAL HIGH (ref 0.44–1.00)
GFR, Estimated: 53 mL/min — ABNORMAL LOW (ref >60)
Glucose, Bld: 84 mg/dL (ref 70–99)
Potassium: 3.2 mmol/L — ABNORMAL LOW (ref 3.5–5.1)
Sodium: 142 mmol/L (ref 135–145)
Total Bilirubin: 0.5 mg/dL (ref 0.3–1.2)
Total Protein: 7.4 g/dL (ref 6.5–8.1)

## 2021-06-19 LAB — VITAMIN B12: Vitamin B-12: 183 pg/mL (ref 180–914)

## 2021-06-19 LAB — TSH: TSH: 237.434 u[IU]/mL — ABNORMAL HIGH (ref 0.308–3.960)

## 2021-06-19 MED ORDER — INFLUENZA VAC A&B SA ADJ QUAD 0.5 ML IM PRSY
0.5000 mL | PREFILLED_SYRINGE | Freq: Once | INTRAMUSCULAR | Status: AC
  Administered 2021-06-19: 0.5 mL via INTRAMUSCULAR

## 2021-06-19 MED ORDER — LEVOTHYROXINE SODIUM 50 MCG PO TABS: 50.0000 ug | ORAL_TABLET | Freq: Every day | ORAL | 3 refills | Status: DC

## 2021-06-19 MED ORDER — CYANOCOBALAMIN 1000 MCG/ML IJ SOLN
1000.0000 ug | Freq: Once | INTRAMUSCULAR | Status: AC
Start: 2021-06-19 — End: 2021-06-19
  Administered 2021-06-19: 1000 ug via INTRAMUSCULAR

## 2021-06-19 MED ORDER — SODIUM CHLORIDE 0.9 % IV SOLN: Freq: Once | INTRAVENOUS | Status: AC

## 2021-06-19 MED ORDER — DIPHENHYDRAMINE HCL 25 MG PO CAPS
25.0000 mg | ORAL_CAPSULE | Freq: Once | ORAL | Status: AC
  Administered 2021-06-19: 25 mg via ORAL

## 2021-06-19 MED ORDER — SODIUM CHLORIDE 0.9 % IV SOLN
200.0000 mg | Freq: Once | INTRAVENOUS | Status: AC
  Administered 2021-06-19: 200 mg via INTRAVENOUS
  Filled 2021-06-19: qty 8

## 2021-06-19 NOTE — Assessment & Plan Note (Signed)
She denies recent diarrhea I suspect this is due to poor oral intake She is not symptomatic Observe closely for now

## 2021-06-19 NOTE — Progress Notes (Signed)
[place] OFFICE PROGRESS NOTE  Patient Care Team: Default, Provider, MD as PCP - General  ASSESSMENT & PLAN:  Malignant neoplasm of cervix [facility]) Her recent CT imaging showed no signs of disease progression She tolerated treatment very well with no side effects The plan will be to continue treatment for few more months I plan to repeat CT imaging again at the end of October  Vitamin B12 deficiency After many B12 injections, her B12 level has improved but she is still profoundly deficient She will continue to get B12 injection with every visit, plan to recheck B12 level at the end of Oct  Preventive measure We discussed the importance of preventive care and reviewed the vaccination programs. She does not have any prior allergic reactions to influenza vaccination. She agrees to proceed with influenza vaccination today and we will administer it today at the clinic.   Acquired hypothyroidism Her TSH was profoundly elevated recently She was started on Synthroid and stated she is compliant taking it We will continue to monitor her thyroid function carefully  Hypokalemia She denies recent diarrhea I suspect this is due to poor oral intake She is not symptomatic Observe closely for now  Orders Placed This Encounter  Procedures   CT ABDOMEN PELVIS W CONTRAST    Standing Status:   Future    Standing Expiration Date:   06/19/2022    Order Specific Question:   If indicated for the ordered procedure, I authorize the administration of contrast media per Radiology protocol    Answer:   Yes    Order Specific Question:   Preferred imaging location?    Answer:   [facility]    Order Specific Question:   Radiology Contrast Protocol - do NOT remove file path    Answer:   [email or web address]    All questions were answered. The patient knows to call the clinic with any problems, questions or concerns. The total time spent in the  appointment was 30 minutes encounter with patients including review of chart and various tests results, discussions about plan of care and coordination of care plan   [name], MD 06/19/2021 8:31 AM  INTERVAL HISTORY: Please see below for problem oriented charting. she returns for treatment follow-up for pembrolizumab for recurrent cervical cancer She is doing well She stated she is compliant taking Synthroid as directed Denies pelvic pain, bleeding no vaginal discharge No recent diarrhea  REVIEW OF SYSTEMS:   Constitutional: Denies fevers, chills or abnormal weight loss Eyes: Denies blurriness of vision Ears, nose, mouth, throat, and face: Denies mucositis or sore throat Respiratory: Denies cough, dyspnea or wheezes Cardiovascular: Denies palpitation, chest discomfort or lower extremity swelling Gastrointestinal:  Denies nausea, heartburn or change in bowel habits Skin: Denies abnormal skin rashes Lymphatics: Denies new lymphadenopathy or easy bruising Neurological:Denies numbness, tingling or new weaknesses Behavioral/Psych: Mood is stable, no new changes  All other systems were reviewed with the patient and are negative.  I have reviewed the past medical history, past surgical history, social history and family history with the patient and they are unchanged from previous note.  ALLERGIES:  has No Known Allergies.  MEDICATIONS:  Current Outpatient Medications  Medication Sig Dispense Refill   levothyroxine (SYNTHROID) 25 MCG tablet Take 1 tablet (25 mcg total) by mouth daily before breakfast. 30 tablet 3   Multiple Vitamin (MULTIVITAMIN) tablet Take 1 tablet by mouth daily.     ondansetron (ZOFRAN) 8 MG tablet Take 1 tablet (8 mg total)  by mouth every 8 (eight) hours as needed for refractory nausea / vomiting. Start on day 3 after carboplatin chemo. 30 tablet 1   prochlorperazine (COMPAZINE) 10 MG tablet Take 1 tablet (10 mg total) by mouth every 6 (six) hours as needed  (Nausea or vomiting). 30 tablet 1   No current facility-administered medications for this visit.   Facility-Administered Medications Ordered in Other Visits  Medication Dose Route Frequency Provider Last Rate Last Admin   influenza vaccine adjuvanted (FLUAD) injection 0.5 mL  0.5 mL Intramuscular Once [name], MD        SUMMARY OF ONCOLOGIC HISTORY: Oncology History Overview Note  Cancer Staging Malignant neoplasm of cervix ([place]) Staging form: Cervix Uteri, AJCC 8th Edition - Clinical: Stage IIIB (cT3b, cN1, cM0) - Signed by [name], MD on 05/04/2018  PD-L1 1%   Malignant neoplasm of cervix ([place])  03/13/2018 Imaging   US pelvis There are mobile echogenic foci within the endometrial cavity suggestive of gas. This is nonspecific in etiology however may be secondary to endometritis. Correlate for history of recent instrumentation. This obscures visualization of the endometrial tissue and therefore a follow-up pelvic ultrasound in 2-4 weeks is recommended after resolution of the acute symptomatology if symptoms persist to further evaluate the endometrium.     03/13/2018 Initial Diagnosis   She initially presented with PMB   04/12/2018 Pathology Results   Cervix, biopsy, mass - INVASIVE SQUAMOUS CELL CARCINOMA - SEE COMMENT   04/12/2018 Surgery   PREOPERATIVE DIAGNOSIS:  Postmenopausal vaginal bleeding. POSTOPERATIVE DIAGNOSIS: The same PROCEDURE: Exam under anesthesia, pap smear, cervical mass biopsy SURGEON:  Dr. [name]   INDICATIONS: 70 y.o. yo G0P0000 with PMB here for exam under anesthesia.Risks of surgery were discussed with the patient including but not limited to: bleeding which may require transfusion; infection which may require antibiotics; injury to uterus or surrounding organs; need for additional procedures including laparotomy or laparoscopy; and other postoperative/anesthesia complications. Written informed consent was obtained.     FINDINGS:  An 8-week  size midline uterus.  No adnexal mass palpable on exam. Normal cervix not visualized. Friable mass seen in involving the vagina at the level of the cervix obliterating the anterior and posterior fornix.   ANESTHESIA:   General INTRAVENOUS FLUIDS:  300 ml of LR ESTIMATED BLOOD LOSS: 20 ml. SPECIMENS: pap smear, cervical mass biopsy COMPLICATIONS:  None immediate.     04/25/2018 PET scan   1. Large cervical mass may invade into the myometrium in down into the vagina, maximum SUV 21.3. There is a malignant left external iliac node measuring 1.4 cm in short axis with maximum SUV 14.1. 2. Accentuated activity in the cecum and ascending colon is likely physiologic given that it has no CT correlate. Correlation with the patient's colon cancer screening history is recommended. If screening is not up-to-date, appropriate screening should be considered. 3.  Aortic Atherosclerosis (ICD10-I70.0).     04/28/2018 Surgery   Pre-operative Diagnosis:  At least Stage 2B SCCa Cervical Cancer   Post-operative Diagnosis: At least clinical Stage 3A SCCa Cervical Cancer   Operation:  Exam under anesthesia due to intolerance for vaginal exam in office Cystoscopy due to concern for extension to posterior bladder (from anterior vaginal wall lesion)   Operative Findings:   Parametrial involvement on right  Right sided subvaginal tumor burden ~3cm. This approximates the right ureteral insertion into the bladder based on my exam during cystoscopy. Extension of disease down upper half of vagina on lateral and posterior  walls. Extension of disease down to lower third of anterior vagina (versus skip lesion) thus making her Stage 3A Cystoscopy revealed patent bilateral ureteral orifices and no bladder mucosa invasion or areas of concern. Rectal exam grossly no disease.     05/04/2018 Cancer Staging   Staging form: Cervix Uteri, AJCC 8th Edition - Clinical: Stage IIIB (cT3b, cN1, cM0) - Signed by [name], MD on  05/04/2018   05/12/2018 Procedure   Successful 8 French right internal jugular vein power port placement with its tip at the SVC/RA junction   05/13/2018 - 06/24/2018 Chemotherapy   The patient had weekly cisplatin given with concurrent radiation    05/17/2018 - 07/27/2018 Radiation Therapy   Radiation treatment dates:   05/17/2018-07/27/2018   Site/dose: 1. Cervix, 1.8 Gy in 25 fractions for a total dose of 45 Gy                    2. Pelvis  Boost, 1.8 Gy in 5 fractions for a total dose of 9 Gy                    3. Cervix, 5.5 Gy in 5 fractions for a total dose of 27.5 Gy   06/03/2018 Adverse Reaction   She missed her chemo due to severe depression and was sent to the ER for suicidal ideation.  She was seen by psychiatrist.   10/27/2018 PET scan   IMPRESSION: 1. Complete metabolic response to therapy, without residual or recurrent hypermetabolic disease. 2.  Aortic Atherosclerosis (ICD10-I70.0).     11/17/2018 Imaging   Successful right IJ vein Port-A-Cath explant.   03/29/2020 PET scan   1. Hypermetabolic lesion along the right vaginal cuff is highly worrisome for recurrent cervical cancer. No evidence of distant metastatic disease. 2.  Aortic atherosclerosis (ICD10-I70.0).   04/26/2020 -  Chemotherapy   The patient had CARBOplatin  for chemotherapy treatment.      Genetic Testing   Patient has genetic testing done for PD-L1. Results revealed patient has the following:  PD-L1 combined positive score (CPS): 1%   07/08/2020 Imaging   1. Unchanged post treatment appearance of the pelvis, including a soft tissue nodule in the low right hemipelvis adjacent to the vagina measuring 2.8 x 1.7 cm, previously FDG avid and consistent with malignancy. 2. No evidence of nodal or distant metastatic disease in the abdomen or pelvis. 3. Aortic Atherosclerosis (ICD10-I70.0).   10/14/2020 Imaging   1. Unchanged right eccentric soft tissue mass in the low right hemipelvis centered on the cervix  and vagina, measuring 2.9 x 1.9 cm. 2. New small volume fluid within the endometrial cavity, abnormal in the postmenopausal setting and suggesting obstruction of the cervical os by soft tissue mass. 3. No evidence of lymphadenopathy or distant metastatic disease in the abdomen or pelvis.   Aortic Atherosclerosis (ICD10-I70.0).   12/24/2020 Imaging   1. Abnormal hypodense masslike appearance continuous with hypodense masslike lesion probably centrally within a right eccentric cervix (versus right paracervical mass). The endometrial portion has enlarged compared to 10/14/2020 and active malignancy is suspected. 2. No adenopathy or findings of distant spread. 3. Other imaging findings of potential clinical significance: Aortic Atherosclerosis (ICD10-I70.0). Mild cardiomegaly. Stable scarring in the left lower lobe. Sigmoid colon scattered diverticula. Tarlov cysts at the S2 level. Vertical sclerosis in the sacral ala possibly due to stress fractures or prior radiation therapy.     01/06/2021 -  Chemotherapy   Patient is on Treatment Plan :  UTERINE Pembrolizumab q21d     04/07/2021 Imaging   1. The previously hypermetabolic right vaginal mass currently measures 12 cubic cm in volume, previously 11 cubic cm on 12/23/2020. 2. No new adenopathy or new findings of distant metastatic spread. 3. New wall thickening in several loops of distal ileum suspicious for inflammation/enteritis. 4. Other imaging findings of potential clinical significance: Mild cardiomegaly. Mild sigmoid colon diverticulosis. Stable bony demineralization with vertical sclerosis in the sacral ala likely from old insufficiency fracture.     PHYSICAL EXAMINATION: ECOG PERFORMANCE STATUS: 1 - Symptomatic but completely ambulatory  Vitals:   06/19/21 0803  BP: (!) 156/92  Pulse: 80  Resp: 18  Temp: 98 F (36.7 C)   Filed Weights   06/19/21 0803  Weight: 91 lb 3.2 oz (41.4 kg)    GENERAL:alert, no distress and comfortable.   She looks thin and cachectic SKIN: skin color, texture, turgor are normal, no rashes or significant lesions EYES: normal, Conjunctiva are pink and non-injected, sclera clear OROPHARYNX:no exudate, no erythema and lips, buccal mucosa, and tongue normal  NECK: supple, thyroid normal size, non-tender, without nodularity LYMPH:  no palpable lymphadenopathy in the cervical, axillary or inguinal LUNGS: clear to auscultation and percussion with normal breathing effort HEART: regular rate & rhythm and no murmurs and no lower extremity edema ABDOMEN:abdomen soft, non-tender and normal bowel sounds Musculoskeletal:no cyanosis of digits and no clubbing  NEURO: alert & oriented x 3 with fluent speech, no focal motor/sensory deficits  LABORATORY DATA:  I have reviewed the data as listed    Component Value Date/Time   NA 142 06/19/2021 0725   K 3.2 (L) 06/19/2021 0725   CL 104 06/19/2021 0725   CO2 25 06/19/2021 0725   GLUCOSE 84 06/19/2021 0725   BUN 13 06/19/2021 0725   CREATININE 1.12 (H) 06/19/2021 0725   CALCIUM 9.2 06/19/2021 0725   PROT 7.4 06/19/2021 0725   ALBUMIN 4.2 06/19/2021 0725   AST 24 06/19/2021 0725   ALT 10 06/19/2021 0725   ALKPHOS 61 06/19/2021 0725   BILITOT 0.5 06/19/2021 0725   GFRNONAA 53 (L) 06/19/2021 0725   GFRAA >60 06/14/2020 0844   GFRAA >60 03/11/2020 0951    No results found for: SPEP, UPEP  Lab Results  Component Value Date   WBC 4.0 06/19/2021   NEUTROABS 2.7 06/19/2021   HGB 11.3 (L) 06/19/2021   HCT 34.3 (L) 06/19/2021   MCV 88.9 06/19/2021   PLT 169 06/19/2021      Chemistry      Component Value Date/Time   NA 142 06/19/2021 0725   K 3.2 (L) 06/19/2021 0725   CL 104 06/19/2021 0725   CO2 25 06/19/2021 0725   BUN 13 06/19/2021 0725   CREATININE 1.12 (H) 06/19/2021 0725      Component Value Date/Time   CALCIUM 9.2 06/19/2021 0725   ALKPHOS 61 06/19/2021 0725   AST 24 06/19/2021 0725   ALT 10 06/19/2021 0725   BILITOT 0.5 06/19/2021  0725

## 2021-06-19 NOTE — Assessment & Plan Note (Signed)
We discussed the importance of preventive care and reviewed the vaccination programs. She does not have any prior allergic reactions to influenza vaccination. She agrees to proceed with influenza vaccination today and we will administer it today at the clinic.  

## 2021-06-19 NOTE — Assessment & Plan Note (Signed)
Her TSH was profoundly elevated recently She was started on Synthroid and stated she is compliant taking it We will continue to monitor her thyroid function carefully

## 2021-06-19 NOTE — Assessment & Plan Note (Signed)
After many B12 injections, her B12 level has improved but she is still profoundly deficient She will continue to get B12 injection with every visit, plan to recheck B12 level at the end of Oct

## 2021-06-19 NOTE — Assessment & Plan Note (Signed)
Her recent CT imaging showed no signs of disease progression She tolerated treatment very well with no side effects The plan will be to continue treatment for few more months I plan to repeat CT imaging again at the end of October

## 2021-06-19 NOTE — Patient Instructions (Addendum)
[facility] MEDICAL ONCOLOGY  Discharge Instructions: Thank you for choosing [facility] to provide your oncology and hematology care.   If you have a lab appointment with the Cancer Center, please go directly to the Cancer Center and check in at the registration area.   Wear comfortable clothing and clothing appropriate for easy access to any Portacath or PICC line.   We strive to give you quality time with your provider. You may need to reschedule your appointment if you arrive late (15 or more minutes).  Arriving late affects you and other patients whose appointments are after yours.  Also, if you miss three or more appointments without notifying the office, you may be dismissed from the clinic at the provider's discretion.      For prescription refill requests, have your pharmacy contact our office and allow 72 hours for refills to be completed.    Today you received the following chemotherapy and/or immunotherapy agents Keytruda      To help prevent nausea and vomiting after your treatment, we encourage you to take your nausea medication as directed.  BELOW ARE SYMPTOMS THAT SHOULD BE REPORTED IMMEDIATELY: *FEVER GREATER THAN 100.4 F (38 C) OR HIGHER *CHILLS OR SWEATING *NAUSEA AND VOMITING THAT IS NOT CONTROLLED WITH YOUR NAUSEA MEDICATION *UNUSUAL SHORTNESS OF BREATH *UNUSUAL BRUISING OR BLEEDING *URINARY PROBLEMS (pain or burning when urinating, or frequent urination) *BOWEL PROBLEMS (unusual diarrhea, constipation, pain near the anus) TENDERNESS IN MOUTH AND THROAT WITH OR WITHOUT PRESENCE OF ULCERS (sore throat, sores in mouth, or a toothache) UNUSUAL RASH, SWELLING OR PAIN  UNUSUAL VAGINAL DISCHARGE OR ITCHING   Items with * indicate a potential emergency and should be followed up as soon as possible or go to the Emergency Department if any problems should occur.  Please show the CHEMOTHERAPY ALERT CARD or IMMUNOTHERAPY ALERT CARD at check-in to  the Emergency Department and triage nurse.  Should you have questions after your visit or need to cancel or reschedule your appointment, please contact [facility]COL[facility]  Dept: [phone]  and follow the prompts.  Office hours are 8:00 a.m. to 4:30 p.m. Monday - Friday. Please note that voicemails left after 4:00 p.m. may not be returned until the following business day.  We are closed weekends and major holidays. You have access to a nurse at all times for urgent questions. Please call the main number to the clinic Dept: [phone] and follow the prompts.   For any non-urgent questions, you may also contact your provider using MyChart. We now offer e-Visits for anyone 18 and older to request care online for non-urgent symptoms. For details visit [email or web address].   Also download the MyChart app! Go to the app store, search "MyChart", open the app, select [facility], and log in with your MyChart username and password.  Due to Covid, a mask is required upon entering the hospital/clinic. If you do not have a mask, one will be given to you upon arrival. For doctor visits, patients may have 1 support person aged 18 or older with them. For treatment visits, patients cannot have anyone with them due to current Covid guidelines and our immunocompromised population.   Influenza (Flu) Vaccine (Inactivated or Recombinant): What You Need to Know 1. Why get vaccinated? Influenza vaccine can prevent influenza (flu). Flu is a contagious disease that spreads around the [place] every year, usually between October and May. Anyone can get the flu, but it is more dangerous   for some people. Infants and young children, people 65 years and older, pregnant people, and people with certain health conditions or a weakened immune system are at greatest risk of flu complications. Pneumonia, bronchitis, sinus infections, and ear infections are examples of flu-related complications. If you  have a medical condition, such as heart disease, cancer, or diabetes, flu can make it worse. Flu can cause fever and chills, sore throat, muscle aches, fatigue, cough, headache, and runny or stuffy nose. Some people may have vomiting and diarrhea, though this is more common in children than adults. In an average year, thousands of people in the [place] die from flu, and many more are hospitalized. Flu vaccine prevents millions of illnesses and flu-related visits to the doctor each year. 2. Influenza vaccines CDC recommends everyone 6 months and older get vaccinated every flu season. Children 6 months through 8 years of age may need 2 doses during a single flu season. Everyone else needs only 1 dose each flu season. It takes about 2 weeks for protection to develop after vaccination. There are many flu viruses, and they are always changing. Each year a new flu vaccine is made to protect against the influenza viruses believed to be likely to cause disease in the upcoming flu season. Even when the vaccine doesn't exactly match these viruses, it may still provide some protection. Influenza vaccine does not cause flu. Influenza vaccine may be given at the same time as other vaccines. 3. Talk with your health care provider Tell your vaccination provider if the person getting the vaccine: Has had an allergic reaction after a previous dose of influenza vaccine, or has any severe, life-threatening allergies Has ever had Guillain-Barr Syndrome (also called "GBS") In some cases, your health care provider may decide to postpone influenza vaccination until a future visit. Influenza vaccine can be administered at any time during pregnancy. People who are or will be pregnant during influenza season should receive inactivated influenza vaccine. People with minor illnesses, such as a cold, may be vaccinated. People who are moderately or severely ill should usually wait until they recover before getting influenza  vaccine. Your health care provider can give you more information. 4. Risks of a vaccine reaction Soreness, redness, and swelling where the shot is given, fever, muscle aches, and headache can happen after influenza vaccination. There may be a very small increased risk of Guillain-Barr Syndrome (GBS) after inactivated influenza vaccine (the flu shot). Young children who get the flu shot along with pneumococcal vaccine (PCV13) and/or DTaP vaccine at the same time might be slightly more likely to have a seizure caused by fever. Tell your health care provider if a child who is getting flu vaccine has ever had a seizure. People sometimes faint after medical procedures, including vaccination. Tell your provider if you feel dizzy or have vision changes or ringing in the ears. As with any medicine, there is a very remote chance of a vaccine causing a severe allergic reaction, other serious injury, or death. 5. What if there is a serious problem? An allergic reaction could occur after the vaccinated person leaves the clinic. If you see signs of a severe allergic reaction (hives, swelling of the face and throat, difficulty breathing, a fast heartbeat, dizziness, or weakness), call 9-1-1 and get the person to the nearest hospital. For other signs that concern you, call your health care provider. Adverse reactions should be reported to the Vaccine Adverse Event Reporting System (VAERS). Your health care provider will usually file this   report, or you can do it yourself. Visit the VAERS website at www.vaers.hhs.gov or call 1-800-822-7967. VAERS is only for reporting reactions, and VAERS staff members do not give medical advice. 6. The National Vaccine Injury Compensation Program The National Vaccine Injury Compensation Program (VICP) is a federal program that was created to compensate people who may have been injured by certain vaccines. Claims regarding alleged injury or death due to vaccination have a time limit  for filing, which may be as short as two years. Visit the VICP website at www.hrsa.gov/vaccinecompensation or call 1-800-338-2382 to learn about the program and about filing a claim. 7. How can I learn more? Ask your health care provider. Call your local or state health department. Visit the website of the Food and Drug Administration (FDA) for vaccine package inserts and additional information at www.fda.gov/vaccines-blood-biologics/vaccines. Contact the Centers for Disease Control and Prevention (CDC): Call 1-800-232-4636 (1-800-CDC-INFO) or Visit CDC's website at www.cdc.gov/flu. Vaccine Information Statement Inactivated Influenza Vaccine (04/19/2020) This information is not intended to replace advice given to you by your health care provider. Make sure you discuss any questions you have with your health care provider. Document Revised: 06/06/2020 Document Reviewed: 06/06/2020 Elsevier Patient Education  2022 [employer] Inc[employer]   

## 2021-06-20 LAB — T4: T4, Total: 3.5 ug/dL — ABNORMAL LOW (ref 4.5–12.0)

## 2021-07-09 ENCOUNTER — Encounter (HOSPITAL_COMMUNITY): Payer: Self-pay

## 2021-07-09 ENCOUNTER — Other Ambulatory Visit: Payer: Self-pay

## 2021-07-09 ENCOUNTER — Ambulatory Visit (HOSPITAL_COMMUNITY)
Admission: RE | Admit: 2021-07-09 | Discharge: 2021-07-09 | Disposition: A | Discharge status: Home / Self Care | Payer: Medicare Other | Source: Ambulatory Visit | Attending: Hematology and Oncology | Admitting: Hematology and Oncology

## 2021-07-09 DIAGNOSIS — C539 Malignant neoplasm of cervix uteri, unspecified: Secondary | ICD-10-CM | POA: Insufficient documentation

## 2021-07-09 MED ORDER — IOHEXOL 350 MG/ML SOLN
75.0000 mL | Freq: Once | INTRAVENOUS | Status: AC | PRN
  Administered 2021-07-09: 75 mL via INTRAVENOUS

## 2021-07-11 ENCOUNTER — Other Ambulatory Visit: Payer: Self-pay

## 2021-07-11 ENCOUNTER — Other Ambulatory Visit: Payer: Medicare Other

## 2021-07-11 ENCOUNTER — Ambulatory Visit: Payer: Medicare Other

## 2021-07-11 ENCOUNTER — Inpatient Hospital Stay (HOSPITAL_BASED_OUTPATIENT_CLINIC_OR_DEPARTMENT_OTHER): Payer: Medicare Other | Admitting: Hematology and Oncology

## 2021-07-11 ENCOUNTER — Encounter: Payer: Self-pay | Admitting: Hematology and Oncology

## 2021-07-11 VITALS — BP 167/74 | HR 91 | Temp 98.2°F | Resp 18 | Ht 62.0 in | Wt 94.2 lb

## 2021-07-11 DIAGNOSIS — C539 Malignant neoplasm of cervix uteri, unspecified: Secondary | ICD-10-CM | POA: Diagnosis not present

## 2021-07-11 DIAGNOSIS — E039 Hypothyroidism, unspecified: Secondary | ICD-10-CM

## 2021-07-11 DIAGNOSIS — Z7189 Other specified counseling: Secondary | ICD-10-CM | POA: Diagnosis not present

## 2021-07-11 DIAGNOSIS — E538 Deficiency of other specified B group vitamins: Secondary | ICD-10-CM

## 2021-07-11 DIAGNOSIS — Z5112 Encounter for antineoplastic immunotherapy: Secondary | ICD-10-CM | POA: Diagnosis not present

## 2021-07-11 DIAGNOSIS — N1330 Unspecified hydronephrosis: Secondary | ICD-10-CM

## 2021-07-11 MED ORDER — LIDOCAINE-PRILOCAINE 2.5-2.5 % EX CREA: TOPICAL_CREAM | CUTANEOUS | 3 refills | Status: DC

## 2021-07-11 MED ORDER — PROCHLORPERAZINE MALEATE 10 MG PO TABS: 10.0000 mg | ORAL_TABLET | Freq: Four times a day (QID) | ORAL | 1 refills | Status: DC | PRN

## 2021-07-11 MED ORDER — ONDANSETRON HCL 8 MG PO TABS: 8.0000 mg | ORAL_TABLET | Freq: Three times a day (TID) | ORAL | 1 refills | Status: DC | PRN

## 2021-07-11 NOTE — Assessment & Plan Note (Signed)
Her recent vitamin B12 level was adequate but borderline low She will continue monthly vitamin B12 injection

## 2021-07-11 NOTE — Progress Notes (Signed)
[place] OFFICE PROGRESS NOTE  Patient Care Team: Default, Provider, MD as PCP - General  ASSESSMENT & PLAN:  Malignant neoplasm of cervix ([employer]) I have reviewed multiple imaging studies with the patient's Unfortunately, she has obvious clinical signs of progression with mild hydronephrosis of the right kidney Her renal function is still reasonable We reviewed the current guidelines We discussed poor prognosis associated with recurrent cervical cancer that is not amenable for further radiation or surgery The patient is interested to pursue further palliative chemotherapy Due to her prior history of severe pancytopenia and very underweight, there is no possibility to recommend combination chemotherapy for her For single agent treatment, in the past, she responded well to cisplatin I recommend single agent cisplatin only moving forward  We discussed the role of chemotherapy. The intent is of palliative intent.  We discussed some of the risks, benefits, side-effects of cisplatin  Some of the short term side-effects included, though not limited to, including weight loss, life threatening infections, risk of allergic reactions, need for transfusions of blood products, nausea, vomiting, change in bowel habits, loss of hair, admission to hospital for various reasons, and risks of death.   Long term side-effects are also discussed including risks of infertility, permanent damage to nerve function, hearing loss, chronic fatigue, kidney damage with possibility needing hemodialysis, and rare secondary malignancy including bone marrow disorders.  The patient is aware that the response rates discussed earlier is not guaranteed.  After a long discussion, patient made an informed decision to proceed with the prescribed plan of care.   Patient education material was dispensed. The patient would need port placement prior to treatment I anticipate she can only tolerate 2 to 3 weeks of  treatment back-to-back before needing some time off to recover from side effects of treatment I plan to use 30 mg per metered square weekly treatment    Hydronephrosis of right kidney She has developed hydronephrosis of the right kidney due to mechanical obstruction from her tumor Her renal function is stable I do not recommend stent placement at this point due to risk of complications We will monitor her renal function carefully  Vitamin B12 deficiency Her recent vitamin B12 level was adequate but borderline low She will continue monthly vitamin B12 injection  Acquired hypothyroidism She had acquired hypothyroidism due to side effects of checkpoint inhibitors I will continue to monitor her thyroid function carefully for the first few months while on treatment and adjust Synthroid dose as needed  Goals of care, counseling/discussion We discussed the intent of treatment being palliative due to recurrent cancer We discussed prognosis with or without treatment  Orders Placed This Encounter  Procedures   IR IMAGING GUIDED PORT INSERTION    Standing Status:   Future    Standing Expiration Date:   07/11/2022    Order Specific Question:   Reason for Exam (SYMPTOM  OR DIAGNOSIS REQUIRED)    Answer:   need port for chemo    Order Specific Question:   Preferred Imaging Location?    Answer:   [facility]   CBC with Differential ([place] Only)    Standing Status:   Standing    Number of Occurrences:   20    Standing Expiration Date:   14/97/0263   Basic Metabolic Panel - [place] Only    Standing Status:   Standing    Number of Occurrences:   20    Standing Expiration Date:   07/11/2022  Magnesium    Standing Status:   Standing    Number of Occurrences:   20    Standing Expiration Date:   07/11/2022   TSH    Standing Status:   Standing    Number of Occurrences:   9    Standing Expiration Date:   07/11/2022    All questions were answered. The patient knows to  call the clinic with any problems, questions or concerns. The total time spent in the appointment was 60 minutes encounter with patients including review of chart and various tests results, discussions about plan of care and coordination of care plan   [name], MD 07/11/2021 9:18 AM  INTERVAL HISTORY: Please see below for problem oriented charting. she returns for follow-up on recent repeat imaging study She denies pelvic pain or abnormal vaginal bleeding She is not symptomatic from her disease  REVIEW OF SYSTEMS:   Constitutional: Denies fevers, chills or abnormal weight loss Eyes: Denies blurriness of vision Ears, nose, mouth, throat, and face: Denies mucositis or sore throat Respiratory: Denies cough, dyspnea or wheezes Cardiovascular: Denies palpitation, chest discomfort or lower extremity swelling Gastrointestinal:  Denies nausea, heartburn or change in bowel habits Skin: Denies abnormal skin rashes Lymphatics: Denies new lymphadenopathy or easy bruising Neurological:Denies numbness, tingling or new weaknesses Behavioral/Psych: Mood is stable, no new changes  All other systems were reviewed with the patient and are negative.  I have reviewed the past medical history, past surgical history, social history and family history with the patient and they are unchanged from previous note.  ALLERGIES:  has No Known Allergies.  MEDICATIONS:  Current Outpatient Medications  Medication Sig Dispense Refill   levothyroxine (SYNTHROID) 50 MCG tablet Take 1 tablet (50 mcg total) by mouth daily before breakfast. 30 tablet 3   lidocaine-prilocaine (EMLA) cream Apply to affected area once 30 g 3   Multiple Vitamin (MULTIVITAMIN) tablet Take 1 tablet by mouth daily.     ondansetron (ZOFRAN) 8 MG tablet Take 1 tablet (8 mg total) by mouth every 8 (eight) hours as needed for refractory nausea / vomiting. Start on day 3 after carboplatin chemo. 30 tablet 1   ondansetron (ZOFRAN) 8 MG tablet  Take 1 tablet (8 mg total) by mouth every 8 (eight) hours as needed. Start on the third day after cisplatin chemotherapy. 30 tablet 1   prochlorperazine (COMPAZINE) 10 MG tablet Take 1 tablet (10 mg total) by mouth every 6 (six) hours as needed (Nausea or vomiting). 30 tablet 1   prochlorperazine (COMPAZINE) 10 MG tablet Take 1 tablet (10 mg total) by mouth every 6 (six) hours as needed (Nausea or vomiting). 30 tablet 1   No current facility-administered medications for this visit.    SUMMARY OF ONCOLOGIC HISTORY: Oncology History Overview Note  Cancer Staging Malignant neoplasm of cervix ([place]) Staging form: Cervix Uteri, AJCC 8th Edition - Clinical: Stage IIIB (cT3b, cN1, cM0) - Signed by [name], MD on 05/04/2018  PD-L1 1%3 Progressed on carboplatin and pembrolizumab   Malignant neoplasm of cervix ([place])  03/13/2018 Imaging   US pelvis There are mobile echogenic foci within the endometrial cavity suggestive of gas. This is nonspecific in etiology however may be secondary to endometritis. Correlate for history of recent instrumentation. This obscures visualization of the endometrial tissue and therefore a follow-up pelvic ultrasound in 2-4 weeks is recommended after resolution of the acute symptomatology if symptoms persist to further evaluate the endometrium.     03/13/2018 Initial Diagnosis   She  initially presented with PMB   04/12/2018 Pathology Results   Cervix, biopsy, mass - INVASIVE SQUAMOUS CELL CARCINOMA - SEE COMMENT   04/12/2018 Surgery   PREOPERATIVE DIAGNOSIS:  Postmenopausal vaginal bleeding. POSTOPERATIVE DIAGNOSIS: The same PROCEDURE: Exam under anesthesia, pap smear, cervical mass biopsy SURGEON:  Dr. [name]   INDICATIONS: 70 y.o. yo [id number] with PMB here for exam under anesthesia.Risks of surgery were discussed with the patient including but not limited to: bleeding which may require transfusion; infection which may require antibiotics; injury to uterus  or surrounding organs; need for additional procedures including laparotomy or laparoscopy; and other postoperative/anesthesia complications. Written informed consent was obtained.     FINDINGS:  An 8-week size midline uterus.  No adnexal mass palpable on exam. Normal cervix not visualized. Friable mass seen in involving the vagina at the level of the cervix obliterating the anterior and posterior fornix.   ANESTHESIA:   General INTRAVENOUS FLUIDS:  300 ml of LR ESTIMATED BLOOD LOSS: 20 ml. SPECIMENS: pap smear, cervical mass biopsy COMPLICATIONS:  None immediate.     04/25/2018 PET scan   1. Large cervical mass may invade into the myometrium in down into the vagina, maximum SUV 21.3. There is a malignant left external iliac node measuring 1.4 cm in short axis with maximum SUV 14.1. 2. Accentuated activity in the cecum and ascending colon is likely physiologic given that it has no CT correlate. Correlation with the patient's colon cancer screening history is recommended. If screening is not up-to-date, appropriate screening should be considered. 3.  Aortic Atherosclerosis (ICD10-I70.0).     04/28/2018 Surgery   Pre-operative Diagnosis:  At least Stage 2B SCCa Cervical Cancer   Post-operative Diagnosis: At least clinical Stage 3A SCCa Cervical Cancer   Operation:  Exam under anesthesia due to intolerance for vaginal exam in office Cystoscopy due to concern for extension to posterior bladder (from anterior vaginal wall lesion)   Operative Findings:   Parametrial involvement on right  Right sided subvaginal tumor burden ~3cm. This approximates the right ureteral insertion into the bladder based on my exam during cystoscopy. Extension of disease down upper half of vagina on lateral and posterior walls. Extension of disease down to lower third of anterior vagina (versus skip lesion) thus making her Stage 3A Cystoscopy revealed patent bilateral ureteral orifices and no bladder mucosa invasion  or areas of concern. Rectal exam grossly no disease.     05/04/2018 Cancer Staging   Staging form: Cervix Uteri, AJCC 8th Edition - Clinical: Stage IIIB (cT3b, cN1, cM0) - Signed by [name], MD on 05/04/2018    05/12/2018 Procedure   Successful 8 French right internal jugular vein power port placement with its tip at the SVC/RA junction   05/13/2018 - 06/24/2018 Chemotherapy   The patient had weekly cisplatin given with concurrent radiation    05/17/2018 - 07/27/2018 Radiation Therapy   Radiation treatment dates:   05/17/2018-07/27/2018   Site/dose: 1. Cervix, 1.8 Gy in 25 fractions for a total dose of 45 Gy                    2. Pelvis  Boost, 1.8 Gy in 5 fractions for a total dose of 9 Gy                    3. Cervix, 5.5 Gy in 5 fractions for a total dose of 27.5 Gy   06/03/2018 Adverse Reaction   She missed her chemo due to severe depression  and was sent to the ER for suicidal ideation.  She was seen by psychiatrist.   10/27/2018 PET scan   IMPRESSION: 1. Complete metabolic response to therapy, without residual or recurrent hypermetabolic disease. 2.  Aortic Atherosclerosis (ICD10-I70.0).     11/17/2018 Imaging   Successful right IJ vein Port-A-Cath explant.   03/29/2020 PET scan   1. Hypermetabolic lesion along the right vaginal cuff is highly worrisome for recurrent cervical cancer. No evidence of distant metastatic disease. 2.  Aortic atherosclerosis (ICD10-I70.0).   04/26/2020 - 12/10/2020 Chemotherapy   The patient had CARBOplatin  for chemotherapy treatment.      Genetic Testing   Patient has genetic testing done for PD-L1. Results revealed patient has the following:  PD-L1 combined positive score (CPS): 1%   07/08/2020 Imaging   1. Unchanged post treatment appearance of the pelvis, including a soft tissue nodule in the low right hemipelvis adjacent to the vagina measuring 2.8 x 1.7 cm, previously FDG avid and consistent with malignancy. 2. No evidence of nodal or  distant metastatic disease in the abdomen or pelvis. 3. Aortic Atherosclerosis (ICD10-I70.0).   10/14/2020 Imaging   1. Unchanged right eccentric soft tissue mass in the low right hemipelvis centered on the cervix and vagina, measuring 2.9 x 1.9 cm. 2. New small volume fluid within the endometrial cavity, abnormal in the postmenopausal setting and suggesting obstruction of the cervical os by soft tissue mass. 3. No evidence of lymphadenopathy or distant metastatic disease in the abdomen or pelvis.   Aortic Atherosclerosis (ICD10-I70.0).   12/24/2020 Imaging   1. Abnormal hypodense masslike appearance continuous with hypodense masslike lesion probably centrally within a right eccentric cervix (versus right paracervical mass). The endometrial portion has enlarged compared to 10/14/2020 and active malignancy is suspected. 2. No adenopathy or findings of distant spread. 3. Other imaging findings of potential clinical significance: Aortic Atherosclerosis (ICD10-I70.0). Mild cardiomegaly. Stable scarring in the left lower lobe. Sigmoid colon scattered diverticula. Tarlov cysts at the S2 level. Vertical sclerosis in the sacral ala possibly due to stress fractures or prior radiation therapy.     01/06/2021 - 06/19/2021 Chemotherapy   Patient is on Treatment Plan : UTERINE Pembrolizumab q21d      04/07/2021 Imaging   1. The previously hypermetabolic right vaginal mass currently measures 12 cubic cm in volume, previously 11 cubic cm on 12/23/2020. 2. No new adenopathy or new findings of distant metastatic spread. 3. New wall thickening in several loops of distal ileum suspicious for inflammation/enteritis. 4. Other imaging findings of potential clinical significance: Mild cardiomegaly. Mild sigmoid colon diverticulosis. Stable bony demineralization with vertical sclerosis in the sacral ala likely from old insufficiency fracture.   07/10/2021 Imaging   1. New moderate right-sided hydroureteronephrosis  with decreased perfusion to the right kidney compatible with obstructive uropathy.  2. Interval progression of the heterogeneous low-density uterine lesion, with progression of enhancing soft tissue extending inferiorly to the right, compatible with previously described disease in the right vaginal wall. Represents the site of right ureteral obstruction. 3. No evidence for new metastatic disease in the abdomen or pelvis. 4. Bilateral sacral insufficiency fractures. 5. Trace free fluid in the pelvis. 6. Aortic Atherosclerosis (ICD10-I70.0).     07/25/2021 -  Chemotherapy   Patient is on Treatment Plan : cervical cancer Cisplatin q7d        PHYSICAL EXAMINATION: ECOG PERFORMANCE STATUS: 0 - Asymptomatic  Vitals:   07/11/21 0820  BP: (!) 167/74  Pulse: 91  Resp: 18  Temp:  98.2 F (36.8 C)  SpO2: 98%   Filed Weights   07/11/21 0820  Weight: 94 lb 3.2 oz (42.7 kg)    GENERAL:alert, no distress and comfortable NEURO: alert & oriented x 3 with fluent speech, no focal motor/sensory deficits  LABORATORY DATA:  I have reviewed the data as listed    Component Value Date/Time   NA 142 06/19/2021 0725   K 3.2 (L) 06/19/2021 0725   CL 104 06/19/2021 0725   CO2 25 06/19/2021 0725   GLUCOSE 84 06/19/2021 0725   BUN 13 06/19/2021 0725   CREATININE 1.12 (H) 06/19/2021 0725   CALCIUM 9.2 06/19/2021 0725   PROT 7.4 06/19/2021 0725   ALBUMIN 4.2 06/19/2021 0725   AST 24 06/19/2021 0725   ALT 10 06/19/2021 0725   ALKPHOS 61 06/19/2021 0725   BILITOT 0.5 06/19/2021 0725   GFRNONAA 53 (L) 06/19/2021 0725   GFRAA >60 06/14/2020 0844   GFRAA >60 03/11/2020 0951    No results found for: SPEP, UPEP  Lab Results  Component Value Date   WBC 4.0 06/19/2021   NEUTROABS 2.7 06/19/2021   HGB 11.3 (L) 06/19/2021   HCT 34.3 (L) 06/19/2021   MCV 88.9 06/19/2021   PLT 169 06/19/2021      Chemistry      Component Value Date/Time   NA 142 06/19/2021 0725   K 3.2 (L) 06/19/2021 0725    CL 104 06/19/2021 0725   CO2 25 06/19/2021 0725   BUN 13 06/19/2021 0725   CREATININE 1.12 (H) 06/19/2021 0725      Component Value Date/Time   CALCIUM 9.2 06/19/2021 0725   ALKPHOS 61 06/19/2021 0725   AST 24 06/19/2021 0725   ALT 10 06/19/2021 0725   BILITOT 0.5 06/19/2021 0725       RADIOGRAPHIC STUDIES: I have reviewed multiple imaging studies with the patient I have personally reviewed the radiological images as listed and agreed with the findings in the report. CT ABDOMEN PELVIS W CONTRAST  Result Date: 07/10/2021 CLINICAL DATA:  Uterine/cervical cancer, assess treatment response. EXAM: CT ABDOMEN AND PELVIS WITH CONTRAST TECHNIQUE: Multidetector CT imaging of the abdomen and pelvis was performed using the standard protocol following bolus administration of intravenous contrast. CONTRAST:  9m OMNIPAQUE IOHEXOL 350 MG/ML SOLN COMPARISON:  CT chest-abdomen-pelvis 04/07/2021. FINDINGS: Lower chest: Unremarkable. Hepatobiliary: No suspicious focal abnormality within the liver parenchyma. There is no evidence for gallstones, gallbladder wall thickening, or pericholecystic fluid. No intrahepatic or extrahepatic biliary dilation. Pancreas: No focal mass lesion. No dilatation of the main duct. No intraparenchymal cyst. No peripancreatic edema. Spleen: No splenomegaly. No focal mass lesion. Adrenals/Urinary Tract: No adrenal nodule or mass. New moderate right-sided hydroureteronephrosis with ureteral obstruction in the pelvis at the level of the right pelvic lesion seen on the prior study. Decreased perfusion to the right kidney is compatible with obstructive uropathy. Left kidney unremarkable no left hydroureter. There is right-sided bladder wall thickening posteriorly. Stomach/Bowel: Stomach is unremarkable. No gastric wall thickening. No evidence of outlet obstruction. Duodenum is normally positioned as is the ligament of Treitz. No small bowel wall thickening. No small bowel dilatation. The  terminal ileum is normal. The appendix is not well visualized, but there is no edema or inflammation in the region of the cecum. No gross colonic mass. No colonic wall thickening. Diverticular changes are noted in the left colon without evidence of diverticulitis. Vascular/Lymphatic: There is mild atherosclerotic calcification of the abdominal aorta without aneurysm. There is no gastrohepatic or  hepatoduodenal ligament lymphadenopathy. No retroperitoneal or mesenteric lymphadenopathy. No pelvic sidewall lymphadenopathy. Reproductive: Interval progression of the heterogeneous low-density uterine lesion measuring 2.1 x 1.7 cm today (59/2) compared to 1.6 x 1.4 cm previously. This heterogeneous enhancing soft tissue projects inferiorly to the right, compatible with previously described disease in the right vaginal wall. This measures 3.5 x 2.9 cm today compared to 3.2 x 2.2 cm (remeasured) previously and represents the site of right ureteral obstruction. There is a new hypoattenuating lesion in the inferior vagina near the introitus. Other: Trace free fluid noted in the pelvis. Musculoskeletal: Bones are diffusely demineralized. Bilateral sacral insufficiency fracture evident. Stable superior endplate compression deformity at L5. IMPRESSION: 1. New moderate right-sided hydroureteronephrosis with decreased perfusion to the right kidney compatible with obstructive uropathy. 2. Interval progression of the heterogeneous low-density uterine lesion, with progression of enhancing soft tissue extending inferiorly to the right, compatible with previously described disease in the right vaginal wall. Represents the site of right ureteral obstruction. 3. No evidence for new metastatic disease in the abdomen or pelvis. 4. Bilateral sacral insufficiency fractures. 5. Trace free fluid in the pelvis. 6. Aortic Atherosclerosis (ICD10-I70.0). Electronically Signed   By: Misty Stanley M.D.   On: 07/10/2021 13:44

## 2021-07-11 NOTE — Assessment & Plan Note (Signed)
I have reviewed multiple imaging studies with the patient's Unfortunately, she has obvious clinical signs of progression with mild hydronephrosis of the right kidney Her renal function is still reasonable We reviewed the current guidelines We discussed poor prognosis associated with recurrent cervical cancer that is not amenable for further radiation or surgery The patient is interested to pursue further palliative chemotherapy Due to her prior history of severe pancytopenia and very underweight, there is no possibility to recommend combination chemotherapy for her For single agent treatment, in the past, she responded well to cisplatin I recommend single agent cisplatin only moving forward  We discussed the role of chemotherapy. The intent is of palliative intent.  We discussed some of the risks, benefits, side-effects of cisplatin  Some of the short term side-effects included, though not limited to, including weight loss, life threatening infections, risk of allergic reactions, need for transfusions of blood products, nausea, vomiting, change in bowel habits, loss of hair, admission to hospital for various reasons, and risks of death.   Long term side-effects are also discussed including risks of infertility, permanent damage to nerve function, hearing loss, chronic fatigue, kidney damage with possibility needing hemodialysis, and rare secondary malignancy including bone marrow disorders.  The patient is aware that the response rates discussed earlier is not guaranteed.  After a long discussion, patient made an informed decision to proceed with the prescribed plan of care.   Patient education material was dispensed. The patient would need port placement prior to treatment I anticipate she can only tolerate 2 to 3 weeks of treatment back-to-back before needing some time off to recover from side effects of treatment I plan to use 30 mg per metered square weekly treatment

## 2021-07-11 NOTE — Progress Notes (Signed)
DISCONTINUE OFF PATHWAY REGIMEN - Other   [id number]:Pembrolizumab 200 mg IV D1 q21 Days:   A cycle is every 21 days:     Pembrolizumab   **Always confirm dose/schedule in your pharmacy ordering system**  REASON: Disease Progression PRIOR TREATMENT: Pembrolizumab 200 mg IV D1 q21 Days TREATMENT RESPONSE: Progressive Disease (PD)  START OFF PATHWAY REGIMEN - Other   [id number]:Cisplatin 35 mg/m2 q7 Days + RT:   A cycle is every 7 days:     Cisplatin   **Always confirm dose/schedule in your pharmacy ordering system**  **Administration Notes: not with radiation  Patient Characteristics: Intent of Therapy: Non-Curative / Palliative Intent, Discussed with Patient

## 2021-07-11 NOTE — Assessment & Plan Note (Signed)
We discussed the intent of treatment being palliative due to recurrent cancer We discussed prognosis with or without treatment

## 2021-07-11 NOTE — Assessment & Plan Note (Signed)
She had acquired hypothyroidism due to side effects of checkpoint inhibitors I will continue to monitor her thyroid function carefully for the first few months while on treatment and adjust Synthroid dose as needed

## 2021-07-11 NOTE — Assessment & Plan Note (Signed)
She has developed hydronephrosis of the right kidney due to mechanical obstruction from her tumor Her renal function is stable I do not recommend stent placement at this point due to risk of complications We will monitor her renal function carefully

## 2021-07-17 ENCOUNTER — Other Ambulatory Visit: Payer: Self-pay | Admitting: Radiology

## 2021-07-17 NOTE — Progress Notes (Signed)
Pharmacist Chemotherapy Monitoring - Initial Assessment    Anticipated start date: 07/24/21   The following has been reviewed per standard work regarding the patient's treatment regimen: The patient's diagnosis, treatment plan and drug doses, and organ/hematologic function Lab orders and baseline tests specific to treatment regimen  The treatment plan start date, drug sequencing, and pre-medications Prior authorization status  Patient's documented medication list, including drug-drug interaction screen and prescriptions for anti-emetics and supportive care specific to the treatment regimen The drug concentrations, fluid compatibility, administration routes, and timing of the medications to be used The patient's access for treatment and lifetime cumulative dose history, if applicable  The patient's medication allergies and previous infusion related reactions, if applicable   Changes made to treatment plan:  N/A  Follow up needed:  N/A   Philomena Course, [place], 07/17/2021  11:41 AM

## 2021-07-21 ENCOUNTER — Ambulatory Visit (HOSPITAL_COMMUNITY)
Admission: RE | Admit: 2021-07-21 | Discharge: 2021-07-21 | Disposition: A | Discharge status: Home / Self Care | Payer: Medicare Other | Source: Ambulatory Visit | Attending: Hematology and Oncology | Admitting: Hematology and Oncology

## 2021-07-21 ENCOUNTER — Other Ambulatory Visit: Payer: Self-pay

## 2021-07-21 DIAGNOSIS — C539 Malignant neoplasm of cervix uteri, unspecified: Secondary | ICD-10-CM

## 2021-07-21 DIAGNOSIS — Z79890 Hormone replacement therapy (postmenopausal): Secondary | ICD-10-CM | POA: Insufficient documentation

## 2021-07-21 HISTORY — PX: IR IMAGING GUIDED PORT INSERTION: IMG5740

## 2021-07-21 MED ORDER — FENTANYL CITRATE (PF) 100 MCG/2ML IJ SOLN
INTRAMUSCULAR | Status: AC | PRN
  Administered 2021-07-21: 50 ug via INTRAVENOUS

## 2021-07-21 MED ORDER — SODIUM CHLORIDE 0.9 % IV SOLN: INTRAVENOUS | Status: DC

## 2021-07-21 MED ORDER — HEPARIN SOD (PORK) LOCK FLUSH 100 UNIT/ML IV SOLN
INTRAVENOUS | Status: AC
  Filled 2021-07-21: qty 5

## 2021-07-21 MED ORDER — MIDAZOLAM HCL 2 MG/2ML IJ SOLN
INTRAMUSCULAR | Status: AC | PRN
  Administered 2021-07-21: 1 mg via INTRAVENOUS

## 2021-07-21 MED ORDER — HEPARIN SOD (PORK) LOCK FLUSH 100 UNIT/ML IV SOLN
INTRAVENOUS | Status: AC | PRN
  Administered 2021-07-21: 500 [IU] via INTRAVENOUS

## 2021-07-21 MED ORDER — LIDOCAINE-EPINEPHRINE (PF) 2 %-1:200000 IJ SOLN
INTRAMUSCULAR | Status: AC
  Filled 2021-07-21: qty 20

## 2021-07-21 MED ORDER — LIDOCAINE-EPINEPHRINE (PF) 2 %-1:200000 IJ SOLN
INTRAMUSCULAR | Status: AC | PRN
  Administered 2021-07-21: 10 mL via INTRADERMAL

## 2021-07-21 MED ORDER — FENTANYL CITRATE (PF) 100 MCG/2ML IJ SOLN
INTRAMUSCULAR | Status: AC
  Filled 2021-07-21: qty 2

## 2021-07-21 MED ORDER — MIDAZOLAM HCL 2 MG/2ML IJ SOLN
INTRAMUSCULAR | Status: AC
  Filled 2021-07-21: qty 2

## 2021-07-21 NOTE — H&P (Signed)
Chief Complaint: Patient was seen in consultation today for port placement  Referring Physician(s): [place]  Supervising Physician: Michaelle Birks  Patient Status: [facility] - Out-pt  History of Present Illness: [facility] is a 70 y.o. female with a past medical history significant for MDD, anemia and cervical cancer s/p chemotherapy and radiation (2019) who presents today for port placement with moderate sedation. [facility] was originally diagnosed with cervical cancer in 2019 and underwent chemo/radiation with good treatment response, she has been followed by Dr. Alvy Bimler since that time and was unfortunately noted to have clinical signs of progression on most recent imaging. She is planned to undergo chemotherapy again and IR has been asked to place a port for durable venous access.   Erin [facility] denies any complaints right now, she thinks she had her previous port removed about 3 years ago. She understands the procedure today and is agreeable to proceed.  Past Medical History:  Diagnosis Date   Aortic atherosclerosis ([place])    Diarrhea    chronic secondary to radiation   History of cancer chemotherapy 05-13-2018 to 06-24-2018   History of external beam radiation therapy    completed 06-24-2018  cervical cancer---  followed with    Hypokalemia    Hypomagnesemia    Iron deficiency anemia    Malignant neoplasm cervix [facility]) oncologist-- dr Clemon Chambers   dx 04-12-2018---  invasive SCC of cervix,  Stage IIIB1, (cT3b,cN1,cM0)-- completed chemotherapy and external beam radiation on 10-11-219--  started high dose radiation 06-22-2018 for 5 treatments   MDD (major depressive disorder)    w/ psychosis hallucination episode 06-10-2018   Osteopenia    Ovarian cyst    Left   Pancytopenia, acquired ([place])    Poor appetite    Port-A-Cath in place     Past Surgical History:  Procedure Laterality Date   CYSTOSCOPY N/A 04/28/2018   Procedure: CYSTOSCOPY;  Surgeon: Isabel Caprice, MD;  Location:  [place];  Service: Gynecology;  Laterality: N/A;   EUA/ PAPSMEAR/  CERVICAL MASS BIOPSY  04-12-2018   dr peggy constant @[facility]    IR IMAGING GUIDED PORT INSERTION  05/12/2018   IR REMOVAL TUN ACCESS W/ PORT W/O FL MOD SED  11/17/2018   TANDEM RING INSERTION N/A 06/27/2018   Procedure: TANDEM RING INSERTION;  Surgeon: Gery Pray, MD;  Location: [facility];  Service: Urology;  Laterality: N/A;   TANDEM RING INSERTION N/A 07/04/2018   Procedure: TANDEM RING INSERTION;  Surgeon: Gery Pray, MD;  Location: [facility];  Service: Urology;  Laterality: N/A;   TANDEM RING INSERTION N/A 07/14/2018   Procedure: TANDEM RING INSERTION;  Surgeon: Gery Pray, MD;  Location: [facility];  Service: Urology;  Laterality: N/A;   TANDEM RING INSERTION N/A 07/18/2018   Procedure: TANDEM CYLINDER INSERTION;  Surgeon: Gery Pray, MD;  Location: [facility];  Service: Urology;  Laterality: N/A;   TANDEM RING INSERTION N/A 07/27/2018   Procedure: TANDEM RING INSERTION;  Surgeon: Gery Pray, MD;  Location: [facility];  Service: Urology;  Laterality: N/A;   WISDOM TOOTH EXTRACTION      Allergies: Patient has no known allergies.  Medications: Prior to Admission medications   Medication Sig Start Date End Date Taking? Authorizing Provider  levothyroxine (SYNTHROID) 50 MCG tablet Take 1 tablet (50 mcg total) by mouth daily before breakfast. 06/19/21  Yes Gorsuch, Ni, MD  Multiple Vitamin (MULTIVITAMIN) tablet Take 1 tablet by mouth daily.  Yes [provider]  lidocaine-prilocaine (EMLA) cream Apply to affected area once 07/11/21   [name], MD  ondansetron (ZOFRAN) 8 MG tablet Take 1 tablet (8 mg total) by mouth every 8 (eight) hours as needed for refractory nausea / vomiting. Start on day 3 after carboplatin chemo. 04/19/20   [name], MD  ondansetron (ZOFRAN) 8 MG tablet Take 1 tablet (8 mg total) by  mouth every 8 (eight) hours as needed. Start on the third day after cisplatin chemotherapy. 07/11/21   [name], MD  prochlorperazine (COMPAZINE) 10 MG tablet Take 1 tablet (10 mg total) by mouth every 6 (six) hours as needed (Nausea or vomiting). 04/19/20   [name], MD  prochlorperazine (COMPAZINE) 10 MG tablet Take 1 tablet (10 mg total) by mouth every 6 (six) hours as needed (Nausea or vomiting). 07/11/21   [name], MD     Family History  Problem Relation Age of Onset   Hypertension Mother    Heart disease Neg Hx    Cancer Neg Hx    Diabetes Neg Hx     Social History   Socioeconomic History   Marital status: Single    Spouse name: Not on file   Number of children: 0   Years of education: Not on file   Highest education level: Not on file  Occupational History   Not on file  Tobacco Use   Smoking status: Never   Smokeless tobacco: Never  Vaping Use   Vaping Use: Never used  Substance and Sexual Activity   Alcohol use: No   Drug use: No   Sexual activity: Not Currently    Birth control/protection: None, Post-menopausal  Other Topics Concern   Not on file  Social History Narrative   Not on file   Social Determinants of Health   Financial Resource Strain: Not on file  Food Insecurity: Not on file  Transportation Needs: Not on file  Physical Activity: Not on file  Stress: Not on file  Social Connections: Not on file     Review of Systems: A 12 point ROS discussed and pertinent positives are indicated in the HPI above.  All other systems are negative.  Review of Systems  Constitutional:  Negative for chills and fever.  Respiratory:  Negative for cough and shortness of breath.   Cardiovascular:  Negative for chest pain.  Gastrointestinal:  Negative for abdominal pain, nausea and vomiting.  Musculoskeletal:  Negative for back pain.  Neurological:  Negative for headaches.   Vital Signs: BP (!) 171/80 (BP Location: Right Arm)   Pulse 62   Temp 98.3 F  (36.8 C) (Oral)   Resp 15   SpO2 99%   Physical Exam Vitals reviewed.  Constitutional:      General: She is not in acute distress. HENT:     Head: Normocephalic.     Mouth/Throat:     Mouth: Mucous membranes are moist.     Pharynx: Oropharynx is clear. No oropharyngeal exudate or posterior oropharyngeal erythema.  Cardiovascular:     Rate and Rhythm: Normal rate and regular rhythm.  Pulmonary:     Effort: Pulmonary effort is normal.     Breath sounds: Normal breath sounds.  Abdominal:     General: There is no distension.     Palpations: Abdomen is soft.     Tenderness: There is no abdominal tenderness.  Skin:    General: Skin is warm and dry.  Neurological:     Mental Status: She  is alert and oriented to person, place, and time.  Psychiatric:        Mood and Affect: Mood normal.        Behavior: Behavior normal.        Thought Content: Thought content normal.        Judgment: Judgment normal.     MD Evaluation Airway: WNL Heart: WNL Abdomen: WNL Chest/ Lungs: WNL ASA  Classification: 2 Mallampati/Airway Score: One   Imaging: CT ABDOMEN PELVIS W CONTRAST  Result Date: 07/10/2021 CLINICAL DATA:  Uterine/cervical cancer, assess treatment response. EXAM: CT ABDOMEN AND PELVIS WITH CONTRAST TECHNIQUE: Multidetector CT imaging of the abdomen and pelvis was performed using the standard protocol following bolus administration of intravenous contrast. CONTRAST:  14mL OMNIPAQUE IOHEXOL 350 MG/ML SOLN COMPARISON:  CT chest-abdomen-pelvis 04/07/2021. FINDINGS: Lower chest: Unremarkable. Hepatobiliary: No suspicious focal abnormality within the liver parenchyma. There is no evidence for gallstones, gallbladder wall thickening, or pericholecystic fluid. No intrahepatic or extrahepatic biliary dilation. Pancreas: No focal mass lesion. No dilatation of the main duct. No intraparenchymal cyst. No peripancreatic edema. Spleen: No splenomegaly. No focal mass lesion. Adrenals/Urinary  Tract: No adrenal nodule or mass. New moderate right-sided hydroureteronephrosis with ureteral obstruction in the pelvis at the level of the right pelvic lesion seen on the prior study. Decreased perfusion to the right kidney is compatible with obstructive uropathy. Left kidney unremarkable no left hydroureter. There is right-sided bladder wall thickening posteriorly. Stomach/Bowel: Stomach is unremarkable. No gastric wall thickening. No evidence of outlet obstruction. Duodenum is normally positioned as is the ligament of Treitz. No small bowel wall thickening. No small bowel dilatation. The terminal ileum is normal. The appendix is not well visualized, but there is no edema or inflammation in the region of the cecum. No gross colonic mass. No colonic wall thickening. Diverticular changes are noted in the left colon without evidence of diverticulitis. Vascular/Lymphatic: There is mild atherosclerotic calcification of the abdominal aorta without aneurysm. There is no gastrohepatic or hepatoduodenal ligament lymphadenopathy. No retroperitoneal or mesenteric lymphadenopathy. No pelvic sidewall lymphadenopathy. Reproductive: Interval progression of the heterogeneous low-density uterine lesion measuring 2.1 x 1.7 cm today (59/2) compared to 1.6 x 1.4 cm previously. This heterogeneous enhancing soft tissue projects inferiorly to the right, compatible with previously described disease in the right vaginal wall. This measures 3.5 x 2.9 cm today compared to 3.2 x 2.2 cm (remeasured) previously and represents the site of right ureteral obstruction. There is a new hypoattenuating lesion in the inferior vagina near the introitus. Other: Trace free fluid noted in the pelvis. Musculoskeletal: Bones are diffusely demineralized. Bilateral sacral insufficiency fracture evident. Stable superior endplate compression deformity at L5. IMPRESSION: 1. New moderate right-sided hydroureteronephrosis with decreased perfusion to the right  kidney compatible with obstructive uropathy. 2. Interval progression of the heterogeneous low-density uterine lesion, with progression of enhancing soft tissue extending inferiorly to the right, compatible with previously described disease in the right vaginal wall. Represents the site of right ureteral obstruction. 3. No evidence for new metastatic disease in the abdomen or pelvis. 4. Bilateral sacral insufficiency fractures. 5. Trace free fluid in the pelvis. 6. Aortic Atherosclerosis (ICD10-I70.0). Electronically Signed   By: Misty Stanley M.D.   On: 07/10/2021 13:44    Labs:  CBC: Recent Labs    04/10/21 0725 05/02/21 0731 05/23/21 0736 06/19/21 0725  WBC 5.2 5.1 4.8 4.0  HGB 11.7* 11.1* 11.7* 11.3*  HCT 36.0 34.1* 35.5* 34.3*  PLT 192 213 177 169  COAGS: No results for input(s): INR, APTT in the last 8760 hours.  BMP: Recent Labs    04/10/21 0725 05/02/21 0731 05/23/21 0736 06/19/21 0725  NA 139 141 143 142  K 4.0 3.5 3.3* 3.2*  CL 104 105 105 104  CO2 26 26 25 25   GLUCOSE 91 90 83 84  BUN 13 11 12 13   CALCIUM 9.2 9.0 9.1 9.2  CREATININE 0.80 0.88 1.01* 1.12*  GFRNONAA >60 >60 >60 53*    LIVER FUNCTION TESTS: Recent Labs    04/10/21 0725 05/02/21 0731 05/23/21 0736 06/19/21 0725  BILITOT 0.4 0.3 0.4 0.5  AST 16 16 21 24   ALT 6 7 12 10   ALKPHOS 75 64 63 61  PROT 7.4 7.3 7.2 7.4  ALBUMIN 3.9 3.9 3.9 4.2    TUMOR MARKERS: No results for input(s): AFPTM, CEA, CA199, CHROMGRNA in the last 8760 hours.  Assessment and Plan:  70 y/o F with history of recurrent cervical cancer who presents today for port placement prior to beginning chemotherapy. Per chart review patient previously had right IJ port placed 05/12/18 by Dr. Barbie Banner and removed on 11/17/18 by Dr. Anselm Pancoast.  Risks and benefits of image-guided Port-a-catheter placement were discussed with the patient including, but not limited to bleeding, infection, pneumothorax, or fibrin sheath development and need for  additional procedures.  All of the patient's questions were answered, patient is agreeable to proceed.  Consent signed and in chart.  Thank you for this interesting consult.  I greatly enjoyed meeting Erin Kent and look forward to participating in their care.  A copy of this report was sent to the requesting provider on this date.  Electronically Signed: Joaquim Nam, PA-C 07/21/2021, 10:59 AM   I spent a total of 30 Minutes  in face to face in clinical consultation, greater than 50% of which was counseling/coordinating care for port placement.

## 2021-07-21 NOTE — Procedures (Signed)
Vascular and Interventional Radiology Procedure Note  Patient: Erin Kent DOB: 30-May-1951 Medical Record Number: [id number] Note Date/Time: 07/21/21 2:14 PM   Performing Physician: Michaelle Birks, MD Assistant(s): None  Diagnosis:  Cervical CA  Procedure: PORT PLACEMENT  Anesthesia: Conscious Sedation Complications: None Estimated Blood Loss: Minimal  Findings:  Successful right-sided SL port placement, with the tip of the catheter in the proximal right atrium.  Plan: Catheter ready for use.  See detailed procedure note with images in PACS. The patient tolerated the procedure well without incident or complication and was returned to Recovery in stable condition.    Michaelle Birks, MD Vascular and Interventional Radiology [facility]   Pager. [place]

## 2021-07-24 ENCOUNTER — Other Ambulatory Visit: Payer: Self-pay

## 2021-07-24 ENCOUNTER — Inpatient Hospital Stay: Payer: Medicare Other | Attending: Hematology and Oncology

## 2021-07-24 ENCOUNTER — Inpatient Hospital Stay: Payer: Medicare Other

## 2021-07-24 ENCOUNTER — Other Ambulatory Visit: Payer: Self-pay | Admitting: Hematology and Oncology

## 2021-07-24 VITALS — BP 147/95 | HR 77 | Temp 98.2°F | Resp 16 | Wt 89.6 lb

## 2021-07-24 DIAGNOSIS — C539 Malignant neoplasm of cervix uteri, unspecified: Secondary | ICD-10-CM | POA: Insufficient documentation

## 2021-07-24 DIAGNOSIS — E039 Hypothyroidism, unspecified: Secondary | ICD-10-CM

## 2021-07-24 DIAGNOSIS — D61818 Other pancytopenia: Secondary | ICD-10-CM

## 2021-07-24 DIAGNOSIS — Z5111 Encounter for antineoplastic chemotherapy: Secondary | ICD-10-CM | POA: Diagnosis present

## 2021-07-24 DIAGNOSIS — E538 Deficiency of other specified B group vitamins: Secondary | ICD-10-CM | POA: Diagnosis not present

## 2021-07-24 DIAGNOSIS — D6481 Anemia due to antineoplastic chemotherapy: Secondary | ICD-10-CM | POA: Insufficient documentation

## 2021-07-24 DIAGNOSIS — Z923 Personal history of irradiation: Secondary | ICD-10-CM | POA: Diagnosis not present

## 2021-07-24 DIAGNOSIS — Z79899 Other long term (current) drug therapy: Secondary | ICD-10-CM | POA: Insufficient documentation

## 2021-07-24 DIAGNOSIS — Z7189 Other specified counseling: Secondary | ICD-10-CM

## 2021-07-24 DIAGNOSIS — T451X5D Adverse effect of antineoplastic and immunosuppressive drugs, subsequent encounter: Secondary | ICD-10-CM | POA: Insufficient documentation

## 2021-07-24 LAB — CBC WITH DIFFERENTIAL (CANCER CENTER ONLY)
Abs Immature Granulocytes: 0.04 10*3/uL (ref 0.00–0.07)
Basophils Absolute: 0 10*3/uL (ref 0.0–0.1)
Basophils Relative: 1 %
Eosinophils Absolute: 0.1 10*3/uL (ref 0.0–0.5)
Eosinophils Relative: 1 %
HCT: 33.1 % — ABNORMAL LOW (ref 36.0–46.0)
Hemoglobin: 11.1 g/dL — ABNORMAL LOW (ref 12.0–15.0)
Immature Granulocytes: 1 %
Lymphocytes Relative: 17 %
Lymphs Abs: 0.9 10*3/uL (ref 0.7–4.0)
MCH: 29.9 pg (ref 26.0–34.0)
MCHC: 33.5 g/dL (ref 30.0–36.0)
MCV: 89.2 fL (ref 80.0–100.0)
Monocytes Absolute: 0.5 10*3/uL (ref 0.1–1.0)
Monocytes Relative: 9 %
Neutro Abs: 3.8 10*3/uL (ref 1.7–7.7)
Neutrophils Relative %: 71 %
Platelet Count: 211 10*3/uL (ref 150–400)
RBC: 3.71 MIL/uL — ABNORMAL LOW (ref 3.87–5.11)
RDW: 14.1 % (ref 11.5–15.5)
WBC Count: 5.4 10*3/uL (ref 4.0–10.5)
nRBC: 0 % (ref 0.0–0.2)

## 2021-07-24 LAB — COMPREHENSIVE METABOLIC PANEL
ALT: 9 U/L (ref 0–44)
AST: 20 U/L (ref 15–41)
Albumin: 4.3 g/dL (ref 3.5–5.0)
Alkaline Phosphatase: 64 U/L (ref 38–126)
Anion gap: 12 (ref 5–15)
BUN: 15 mg/dL (ref 8–23)
CO2: 24 mmol/L (ref 22–32)
Calcium: 8.9 mg/dL (ref 8.9–10.3)
Chloride: 103 mmol/L (ref 98–111)
Creatinine, Ser: 1.25 mg/dL — ABNORMAL HIGH (ref 0.44–1.00)
GFR, Estimated: 47 mL/min — ABNORMAL LOW (ref >60)
Glucose, Bld: 88 mg/dL (ref 70–99)
Potassium: 3.4 mmol/L — ABNORMAL LOW (ref 3.5–5.1)
Sodium: 139 mmol/L (ref 135–145)
Total Bilirubin: 0.5 mg/dL (ref 0.3–1.2)
Total Protein: 7.8 g/dL (ref 6.5–8.1)

## 2021-07-24 LAB — TSH: TSH: 250.673 u[IU]/mL — ABNORMAL HIGH (ref 0.308–3.960)

## 2021-07-24 LAB — MAGNESIUM: Magnesium: 1.8 mg/dL (ref 1.7–2.4)

## 2021-07-24 MED ORDER — SODIUM CHLORIDE 0.9 % IV SOLN
150.0000 mg | Freq: Once | INTRAVENOUS | Status: AC
  Administered 2021-07-24: 150 mg via INTRAVENOUS
  Filled 2021-07-24: qty 150

## 2021-07-24 MED ORDER — CYANOCOBALAMIN 1000 MCG/ML IJ SOLN
1000.0000 ug | Freq: Once | INTRAMUSCULAR | Status: AC
  Administered 2021-07-24: 1000 ug via INTRAMUSCULAR
  Filled 2021-07-24: qty 1

## 2021-07-24 MED ORDER — LEVOTHYROXINE SODIUM 75 MCG PO TABS: 75.0000 ug | ORAL_TABLET | Freq: Every day | ORAL | 3 refills | Status: DC

## 2021-07-24 MED ORDER — SODIUM CHLORIDE 0.9% FLUSH
10.0000 mL | Freq: Once | INTRAVENOUS | Status: AC
  Administered 2021-07-24: 10 mL

## 2021-07-24 MED ORDER — POTASSIUM CHLORIDE IN NACL 20-0.9 MEQ/L-% IV SOLN
Freq: Once | INTRAVENOUS | Status: AC
  Filled 2021-07-24: qty 1000

## 2021-07-24 MED ORDER — HEPARIN SOD (PORK) LOCK FLUSH 100 UNIT/ML IV SOLN
500.0000 [IU] | Freq: Once | INTRAVENOUS | Status: AC | PRN
  Administered 2021-07-24: 500 [IU]

## 2021-07-24 MED ORDER — PALONOSETRON HCL INJECTION 0.25 MG/5ML
0.2500 mg | Freq: Once | INTRAVENOUS | Status: AC
  Administered 2021-07-24: 0.25 mg via INTRAVENOUS
  Filled 2021-07-24: qty 5

## 2021-07-24 MED ORDER — SODIUM CHLORIDE 0.9 % IV SOLN: Freq: Once | INTRAVENOUS | Status: AC

## 2021-07-24 MED ORDER — SODIUM CHLORIDE 0.9% FLUSH
10.0000 mL | INTRAVENOUS | Status: DC | PRN
  Administered 2021-07-24: 10 mL

## 2021-07-24 MED ORDER — SODIUM CHLORIDE 0.9 % IV SOLN
10.0000 mg | Freq: Once | INTRAVENOUS | Status: AC
  Administered 2021-07-24: 10 mg via INTRAVENOUS
  Filled 2021-07-24: qty 10

## 2021-07-24 MED ORDER — MAGNESIUM SULFATE 2 GM/50ML IV SOLN
2.0000 g | Freq: Once | INTRAVENOUS | Status: AC
  Administered 2021-07-24: 2 g via INTRAVENOUS
  Filled 2021-07-24: qty 50

## 2021-07-24 MED ORDER — SODIUM CHLORIDE 0.9 % IV SOLN
30.0000 mg/m2 | Freq: Once | INTRAVENOUS | Status: AC
  Administered 2021-07-24: 41 mg via INTRAVENOUS
  Filled 2021-07-24: qty 41

## 2021-07-24 NOTE — Patient Instructions (Signed)
[facility] [facility]  Discharge Instructions: Thank you for choosing [facility] to provide your oncology and hematology care.   If you have a lab appointment with the [facility], please go directly to the [facility] and check in at the registration area.   Wear comfortable clothing and clothing appropriate for easy access to any Portacath or PICC line.   We strive to give you quality time with your provider. You may need to reschedule your appointment if you arrive late (15 or more minutes).  Arriving late affects you and other patients whose appointments are after yours.  Also, if you miss three or more appointments without notifying the office, you may be dismissed from the clinic at the provider's discretion.      For prescription refill requests, have your pharmacy contact our office and allow 72 hours for refills to be completed.    Today you received the following chemotherapy and/or immunotherapy agents : Cisplatin    To help prevent nausea and vomiting after your treatment, we encourage you to take your nausea medication as directed.  BELOW ARE SYMPTOMS THAT SHOULD BE REPORTED IMMEDIATELY: *FEVER GREATER THAN 100.4 F (38 C) OR HIGHER *CHILLS OR SWEATING *NAUSEA AND VOMITING THAT IS NOT CONTROLLED WITH YOUR NAUSEA MEDICATION *UNUSUAL SHORTNESS OF BREATH *UNUSUAL BRUISING OR BLEEDING *URINARY PROBLEMS (pain or burning when urinating, or frequent urination) *BOWEL PROBLEMS (unusual diarrhea, constipation, pain near the anus) TENDERNESS IN MOUTH AND THROAT WITH OR WITHOUT PRESENCE OF ULCERS (sore throat, sores in mouth, or a toothache) UNUSUAL RASH, SWELLING OR PAIN  UNUSUAL VAGINAL DISCHARGE OR ITCHING   Items with * indicate a potential emergency and should be followed up as soon as possible or go to the Emergency Department if any problems should occur.  Please show the CHEMOTHERAPY ALERT CARD or IMMUNOTHERAPY ALERT CARD at check-in to  the Emergency Department and triage nurse.  Should you have questions after your visit or need to cancel or reschedule your appointment, please contact [place] [facility]  Dept: [phone]  and follow the prompts.  Office hours are 8:00 a.m. to 4:30 p.m. Monday - Friday. Please note that voicemails left after 4:00 p.m. may not be returned until the following business day.  We are closed weekends and major holidays. You have access to a nurse at all times for urgent questions. Please call the main number to the clinic Dept: [phone] and follow the prompts.   For any non-urgent questions, you may also contact your provider using MyChart. We now offer e-Visits for anyone 18 and older to request care online for non-urgent symptoms. For details visit [email or web address].   Also download the MyChart app! Go to the app store, search "MyChart", open the app, select [place], and log in with your MyChart username and password.  Due to Covid, a mask is required upon entering the hospital/clinic. If you do not have a mask, one will be given to you upon arrival. For doctor visits, patients may have 1 support person aged 18 or older with them. For treatment visits, patients cannot have anyone with them due to current Covid guidelines and our immunocompromised population.   

## 2021-07-30 ENCOUNTER — Inpatient Hospital Stay: Payer: Medicare Other

## 2021-07-30 ENCOUNTER — Other Ambulatory Visit: Payer: Self-pay | Admitting: Hematology and Oncology

## 2021-07-30 ENCOUNTER — Other Ambulatory Visit: Payer: Self-pay

## 2021-07-30 ENCOUNTER — Ambulatory Visit: Payer: Medicare Other | Admitting: Dietician

## 2021-07-30 ENCOUNTER — Other Ambulatory Visit: Payer: Self-pay | Admitting: *Deleted

## 2021-07-30 VITALS — BP 143/95 | HR 86 | Temp 98.3°F | Resp 18 | Wt 85.0 lb

## 2021-07-30 DIAGNOSIS — Z7189 Other specified counseling: Secondary | ICD-10-CM

## 2021-07-30 DIAGNOSIS — C539 Malignant neoplasm of cervix uteri, unspecified: Secondary | ICD-10-CM

## 2021-07-30 DIAGNOSIS — R634 Abnormal weight loss: Secondary | ICD-10-CM

## 2021-07-30 DIAGNOSIS — Z5111 Encounter for antineoplastic chemotherapy: Secondary | ICD-10-CM | POA: Diagnosis not present

## 2021-07-30 DIAGNOSIS — E039 Hypothyroidism, unspecified: Secondary | ICD-10-CM

## 2021-07-30 LAB — CBC WITH DIFFERENTIAL (CANCER CENTER ONLY)
Abs Immature Granulocytes: 0.04 10*3/uL (ref 0.00–0.07)
Basophils Absolute: 0 10*3/uL (ref 0.0–0.1)
Basophils Relative: 1 %
Eosinophils Absolute: 0.1 10*3/uL (ref 0.0–0.5)
Eosinophils Relative: 1 %
HCT: 33.3 % — ABNORMAL LOW (ref 36.0–46.0)
Hemoglobin: 11 g/dL — ABNORMAL LOW (ref 12.0–15.0)
Immature Granulocytes: 1 %
Lymphocytes Relative: 13 %
Lymphs Abs: 0.7 10*3/uL (ref 0.7–4.0)
MCH: 29.9 pg (ref 26.0–34.0)
MCHC: 33 g/dL (ref 30.0–36.0)
MCV: 90.5 fL (ref 80.0–100.0)
Monocytes Absolute: 0.5 10*3/uL (ref 0.1–1.0)
Monocytes Relative: 9 %
Neutro Abs: 4.2 10*3/uL (ref 1.7–7.7)
Neutrophils Relative %: 75 %
Platelet Count: 196 10*3/uL (ref 150–400)
RBC: 3.68 MIL/uL — ABNORMAL LOW (ref 3.87–5.11)
RDW: 14.1 % (ref 11.5–15.5)
WBC Count: 5.6 10*3/uL (ref 4.0–10.5)
nRBC: 0 % (ref 0.0–0.2)

## 2021-07-30 LAB — COMPREHENSIVE METABOLIC PANEL
ALT: 22 U/L (ref 0–44)
AST: 19 U/L (ref 15–41)
Albumin: 4.2 g/dL (ref 3.5–5.0)
Alkaline Phosphatase: 55 U/L (ref 38–126)
Anion gap: 11 (ref 5–15)
BUN: 15 mg/dL (ref 8–23)
CO2: 25 mmol/L (ref 22–32)
Calcium: 8.7 mg/dL — ABNORMAL LOW (ref 8.9–10.3)
Chloride: 102 mmol/L (ref 98–111)
Creatinine, Ser: 1.37 mg/dL — ABNORMAL HIGH (ref 0.44–1.00)
GFR, Estimated: 42 mL/min — ABNORMAL LOW (ref >60)
Glucose, Bld: 87 mg/dL (ref 70–99)
Potassium: 3.7 mmol/L (ref 3.5–5.1)
Sodium: 138 mmol/L (ref 135–145)
Total Bilirubin: 0.4 mg/dL (ref 0.3–1.2)
Total Protein: 7.3 g/dL (ref 6.5–8.1)

## 2021-07-30 LAB — MAGNESIUM: Magnesium: 1.8 mg/dL (ref 1.7–2.4)

## 2021-07-30 LAB — TSH: TSH: 233.02 u[IU]/mL — ABNORMAL HIGH (ref 0.308–3.960)

## 2021-07-30 MED ORDER — HEPARIN SOD (PORK) LOCK FLUSH 100 UNIT/ML IV SOLN
500.0000 [IU] | Freq: Once | INTRAVENOUS | Status: AC | PRN
  Administered 2021-07-30: 500 [IU]

## 2021-07-30 MED ORDER — SODIUM CHLORIDE 0.9% FLUSH
10.0000 mL | INTRAVENOUS | Status: DC | PRN
  Administered 2021-07-30: 10 mL

## 2021-07-30 MED ORDER — SODIUM CHLORIDE 0.9 % IV SOLN: Freq: Once | INTRAVENOUS | Status: AC

## 2021-07-30 MED ORDER — SODIUM CHLORIDE 0.9 % IV SOLN
150.0000 mg | Freq: Once | INTRAVENOUS | Status: AC
  Administered 2021-07-30: 150 mg via INTRAVENOUS
  Filled 2021-07-30: qty 150

## 2021-07-30 MED ORDER — MAGNESIUM SULFATE 2 GM/50ML IV SOLN
2.0000 g | Freq: Once | INTRAVENOUS | Status: AC
  Administered 2021-07-30: 2 g via INTRAVENOUS
  Filled 2021-07-30: qty 50

## 2021-07-30 MED ORDER — PALONOSETRON HCL INJECTION 0.25 MG/5ML
0.2500 mg | Freq: Once | INTRAVENOUS | Status: AC
  Administered 2021-07-30: 0.25 mg via INTRAVENOUS
  Filled 2021-07-30: qty 5

## 2021-07-30 MED ORDER — SODIUM CHLORIDE 0.9 % IV SOLN
30.0000 mg/m2 | Freq: Once | INTRAVENOUS | Status: AC
  Administered 2021-07-30: 41 mg via INTRAVENOUS
  Filled 2021-07-30: qty 41

## 2021-07-30 MED ORDER — POTASSIUM CHLORIDE IN NACL 20-0.9 MEQ/L-% IV SOLN
Freq: Once | INTRAVENOUS | Status: AC
  Filled 2021-07-30: qty 1000

## 2021-07-30 MED ORDER — SODIUM CHLORIDE 0.9 % IV SOLN
10.0000 mg | Freq: Once | INTRAVENOUS | Status: AC
  Administered 2021-07-30: 10 mg via INTRAVENOUS
  Filled 2021-07-30: qty 10

## 2021-07-30 NOTE — Progress Notes (Signed)
Nutrition Assessment   Reason for Assessment: Provider request   ASSESSMENT: 70 year old female with recurrent cervical cancer. She is receiving weekly cisplatin. Patient followed by Dr. Alvy Bimler.   Past medical history includes B12 deficiency, hydronephrosis, hypothyroidism, osteoporosis, cachexia.    Met with patient during infusion. Patient is drinking a soda, has not eaten the sandwich nurse provided. Patient reports she is going to eat this later, she had a couple [place] sausage patties with cheese and glass of water for breakfast. Patient reports she eats a lot of soups and heat/serve meals. She and her roommate split a cauliflower pizza last night. Patient denies decreased appetite, reports eating 2-3 small meals (cereal, sausage patties for breakfast, has a sandwich or leftovers for lunch, likes chicken strips for dinner. Patient will eat 4 strips and half a baked/sweet potato) She drinks one Boost/day. She is unsure of what kind. Sometimes she likes to snack on sweets (lemon pound cake, nutty buddy). Patient denies nausea, vomiting, diarrhea, constipation.   Medications: MVI, zofran, compazine, B12   Labs: Cr 1.37   Anthropometrics: Weight has decreased ~4 lbs (4.5%) in 6 days. This is severe. Patient weighed 89 lb 9 oz on 11/10 decreased from 94 lb 3.2 oz on 10/28.   Height: 5'2" Weight: 85 lb  UBW: 85.6 lb - 92.6 lb (in past year per chart) BMI: 15.55    NUTRITION DIAGNOSIS: Food and nutrition related knowledge deficit   INTERVENTION:  Educated on importance of adequate calorie and protein energy intake to maintain weight, strength, nutrition Discussed foods with protein and encouraged protein source with all meals and snacks - handout with snack ideas provided  Continue drinking Boost Plus/equivalent and increase to twice daily - coupons provided Contact information provided    MONITORING, EVALUATION, GOAL: Patient will tolerate increased calories and protein to  promote weight gain   Next Visit: Tuesday November 29 with Pamala Hurry

## 2021-07-30 NOTE — Patient Instructions (Signed)
[facility]  Discharge Instructions: Thank you for choosing [facility] to provide your oncology and hematology care.   If you have a lab appointment with the Cancer Center, please go directly to the Cancer Center and check in at the registration area.   Wear comfortable clothing and clothing appropriate for easy access to any Portacath or PICC line.   We strive to give you quality time with your provider. You may need to reschedule your appointment if you arrive late (15 or more minutes).  Arriving late affects you and other patients whose appointments are after yours.  Also, if you miss three or more appointments without notifying the office, you may be dismissed from the clinic at the provider's discretion.      For prescription refill requests, have your pharmacy contact our office and allow 72 hours for refills to be completed.    Today you received the following chemotherapy and/or immunotherapy agents cisplatin   To help prevent nausea and vomiting after your treatment, we encourage you to take your nausea medication as directed.  BELOW ARE SYMPTOMS THAT SHOULD BE REPORTED IMMEDIATELY: *FEVER GREATER THAN 100.4 F (38 C) OR HIGHER *CHILLS OR SWEATING *NAUSEA AND VOMITING THAT IS NOT CONTROLLED WITH YOUR NAUSEA MEDICATION *UNUSUAL SHORTNESS OF BREATH *UNUSUAL BRUISING OR BLEEDING *URINARY PROBLEMS (pain or burning when urinating, or frequent urination) *BOWEL PROBLEMS (unusual diarrhea, constipation, pain near the anus) TENDERNESS IN MOUTH AND THROAT WITH OR WITHOUT PRESENCE OF ULCERS (sore throat, sores in mouth, or a toothache) UNUSUAL RASH, SWELLING OR PAIN  UNUSUAL VAGINAL DISCHARGE OR ITCHING   Items with * indicate a potential emergency and should be followed up as soon as possible or go to the Emergency Department if any problems should occur.  Please show the CHEMOTHERAPY ALERT CARD or IMMUNOTHERAPY ALERT CARD at check-in to the  Emergency Department and triage nurse.  Should you have questions after your visit or need to cancel or reschedule your appointment, please contact [name]  Dept: [phone]  and follow the prompts.  Office hours are 8:00 a.m. to 4:30 p.m. Monday - Friday. Please note that voicemails left after 4:00 p.m. may not be returned until the following business day.  We are closed weekends and major holidays. You have access to a nurse at all times for urgent questions. Please call the main number to the clinic Dept: [phone] and follow the prompts.   For any non-urgent questions, you may also contact your provider using MyChart. We now offer e-Visits for anyone 18 and older to request care online for non-urgent symptoms. For details visit [email or web address].   Also download the MyChart app! Go to the app store, search "MyChart", open the app, select [place], and log in with your MyChart username and password.  Due to Covid, a mask is required upon entering the hospital/clinic. If you do not have a mask, one will be given to you upon arrival. For doctor visits, patients may have 1 support person aged 18 or older with them. For treatment visits, patients cannot have anyone with them due to current Covid guidelines and our immunocompromised population.   

## 2021-07-31 ENCOUNTER — Encounter: Payer: Self-pay | Admitting: Hematology and Oncology

## 2021-08-06 MED FILL — Dexamethasone Sodium Phosphate Inj 100 MG/10ML: INTRAMUSCULAR | Qty: 1 | Status: AC

## 2021-08-06 MED FILL — Fosaprepitant Dimeglumine For IV Infusion 150 MG (Base Eq): INTRAVENOUS | Qty: 5 | Status: AC

## 2021-08-08 ENCOUNTER — Inpatient Hospital Stay: Payer: Medicare Other

## 2021-08-08 ENCOUNTER — Inpatient Hospital Stay (HOSPITAL_BASED_OUTPATIENT_CLINIC_OR_DEPARTMENT_OTHER): Payer: Medicare Other | Admitting: Hematology and Oncology

## 2021-08-08 ENCOUNTER — Other Ambulatory Visit: Payer: Self-pay

## 2021-08-08 ENCOUNTER — Encounter: Payer: Self-pay | Admitting: Hematology and Oncology

## 2021-08-08 DIAGNOSIS — Z7189 Other specified counseling: Secondary | ICD-10-CM

## 2021-08-08 DIAGNOSIS — E538 Deficiency of other specified B group vitamins: Secondary | ICD-10-CM | POA: Diagnosis not present

## 2021-08-08 DIAGNOSIS — C539 Malignant neoplasm of cervix uteri, unspecified: Secondary | ICD-10-CM

## 2021-08-08 DIAGNOSIS — T451X5A Adverse effect of antineoplastic and immunosuppressive drugs, initial encounter: Secondary | ICD-10-CM | POA: Diagnosis not present

## 2021-08-08 DIAGNOSIS — D6481 Anemia due to antineoplastic chemotherapy: Secondary | ICD-10-CM | POA: Diagnosis not present

## 2021-08-08 DIAGNOSIS — Z5111 Encounter for antineoplastic chemotherapy: Secondary | ICD-10-CM | POA: Diagnosis not present

## 2021-08-08 DIAGNOSIS — D61818 Other pancytopenia: Secondary | ICD-10-CM

## 2021-08-08 LAB — CBC WITH DIFFERENTIAL (CANCER CENTER ONLY)
Abs Immature Granulocytes: 0.05 10*3/uL (ref 0.00–0.07)
Basophils Absolute: 0 10*3/uL (ref 0.0–0.1)
Basophils Relative: 0 %
Eosinophils Absolute: 0.1 10*3/uL (ref 0.0–0.5)
Eosinophils Relative: 1 %
HCT: 28.4 % — ABNORMAL LOW (ref 36.0–46.0)
Hemoglobin: 9.3 g/dL — ABNORMAL LOW (ref 12.0–15.0)
Immature Granulocytes: 1 %
Lymphocytes Relative: 14 %
Lymphs Abs: 0.7 10*3/uL (ref 0.7–4.0)
MCH: 29.8 pg (ref 26.0–34.0)
MCHC: 32.7 g/dL (ref 30.0–36.0)
MCV: 91 fL (ref 80.0–100.0)
Monocytes Absolute: 0.3 10*3/uL (ref 0.1–1.0)
Monocytes Relative: 7 %
Neutro Abs: 3.6 10*3/uL (ref 1.7–7.7)
Neutrophils Relative %: 77 %
Platelet Count: 150 10*3/uL (ref 150–400)
RBC: 3.12 MIL/uL — ABNORMAL LOW (ref 3.87–5.11)
RDW: 14.4 % (ref 11.5–15.5)
WBC Count: 4.7 10*3/uL (ref 4.0–10.5)
nRBC: 0 % (ref 0.0–0.2)

## 2021-08-08 LAB — COMPREHENSIVE METABOLIC PANEL
ALT: 25 U/L (ref 0–44)
AST: 29 U/L (ref 15–41)
Albumin: 4.1 g/dL (ref 3.5–5.0)
Alkaline Phosphatase: 60 U/L (ref 38–126)
Anion gap: 9 (ref 5–15)
BUN: 15 mg/dL (ref 8–23)
CO2: 25 mmol/L (ref 22–32)
Calcium: 8.3 mg/dL — ABNORMAL LOW (ref 8.9–10.3)
Chloride: 104 mmol/L (ref 98–111)
Creatinine, Ser: 1.05 mg/dL — ABNORMAL HIGH (ref 0.44–1.00)
GFR, Estimated: 58 mL/min — ABNORMAL LOW (ref >60)
Glucose, Bld: 85 mg/dL (ref 70–99)
Potassium: 3.8 mmol/L (ref 3.5–5.1)
Sodium: 138 mmol/L (ref 135–145)
Total Bilirubin: 0.3 mg/dL (ref 0.3–1.2)
Total Protein: 7 g/dL (ref 6.5–8.1)

## 2021-08-08 LAB — MAGNESIUM: Magnesium: 1.7 mg/dL (ref 1.7–2.4)

## 2021-08-08 MED ORDER — SODIUM CHLORIDE 0.9 % IV SOLN: Freq: Once | INTRAVENOUS | Status: AC

## 2021-08-08 MED ORDER — POTASSIUM CHLORIDE IN NACL 20-0.9 MEQ/L-% IV SOLN
Freq: Once | INTRAVENOUS | Status: AC
  Filled 2021-08-08: qty 1000

## 2021-08-08 MED ORDER — SODIUM CHLORIDE 0.9 % IV SOLN
10.0000 mg | Freq: Once | INTRAVENOUS | Status: AC
  Administered 2021-08-08: 10 mg via INTRAVENOUS
  Filled 2021-08-08: qty 10

## 2021-08-08 MED ORDER — SODIUM CHLORIDE 0.9% FLUSH
10.0000 mL | Freq: Once | INTRAVENOUS | Status: AC
  Administered 2021-08-08: 10 mL

## 2021-08-08 MED ORDER — SODIUM CHLORIDE 0.9% FLUSH
10.0000 mL | INTRAVENOUS | Status: DC | PRN
  Administered 2021-08-08: 10 mL

## 2021-08-08 MED ORDER — SODIUM CHLORIDE 0.9 % IV SOLN
150.0000 mg | Freq: Once | INTRAVENOUS | Status: AC
  Administered 2021-08-08: 150 mg via INTRAVENOUS
  Filled 2021-08-08: qty 150

## 2021-08-08 MED ORDER — MAGNESIUM SULFATE 2 GM/50ML IV SOLN
2.0000 g | Freq: Once | INTRAVENOUS | Status: AC
  Administered 2021-08-08: 2 g via INTRAVENOUS
  Filled 2021-08-08: qty 50

## 2021-08-08 MED ORDER — HEPARIN SOD (PORK) LOCK FLUSH 100 UNIT/ML IV SOLN
500.0000 [IU] | Freq: Once | INTRAVENOUS | Status: AC | PRN
  Administered 2021-08-08: 500 [IU]

## 2021-08-08 MED ORDER — SODIUM CHLORIDE 0.9 % IV SOLN
30.0000 mg/m2 | Freq: Once | INTRAVENOUS | Status: AC
  Administered 2021-08-08: 41 mg via INTRAVENOUS
  Filled 2021-08-08: qty 41

## 2021-08-08 MED ORDER — PALONOSETRON HCL INJECTION 0.25 MG/5ML
0.2500 mg | Freq: Once | INTRAVENOUS | Status: AC
  Administered 2021-08-08: 0.25 mg via INTRAVENOUS
  Filled 2021-08-08: qty 5

## 2021-08-08 NOTE — Patient Instructions (Signed)
[facility]  Discharge Instructions: Thank you for choosing [facility] to provide your oncology and hematology care.   If you have a lab appointment with the Cancer Center, please go directly to the Cancer Center and check in at the registration area.   Wear comfortable clothing and clothing appropriate for easy access to any Portacath or PICC line.   We strive to give you quality time with your provider. You may need to reschedule your appointment if you arrive late (15 or more minutes).  Arriving late affects you and other patients whose appointments are after yours.  Also, if you miss three or more appointments without notifying the office, you may be dismissed from the clinic at the provider's discretion.      For prescription refill requests, have your pharmacy contact our office and allow 72 hours for refills to be completed.    Today you received the following chemotherapy and/or immunotherapy agents : Cisplatin    To help prevent nausea and vomiting after your treatment, we encourage you to take your nausea medication as directed.  BELOW ARE SYMPTOMS THAT SHOULD BE REPORTED IMMEDIATELY: *FEVER GREATER THAN 100.4 F (38 C) OR HIGHER *CHILLS OR SWEATING *NAUSEA AND VOMITING THAT IS NOT CONTROLLED WITH YOUR NAUSEA MEDICATION *UNUSUAL SHORTNESS OF BREATH *UNUSUAL BRUISING OR BLEEDING *URINARY PROBLEMS (pain or burning when urinating, or frequent urination) *BOWEL PROBLEMS (unusual diarrhea, constipation, pain near the anus) TENDERNESS IN MOUTH AND THROAT WITH OR WITHOUT PRESENCE OF ULCERS (sore throat, sores in mouth, or a toothache) UNUSUAL RASH, SWELLING OR PAIN  UNUSUAL VAGINAL DISCHARGE OR ITCHING   Items with * indicate a potential emergency and should be followed up as soon as possible or go to the Emergency Department if any problems should occur.  Please show the CHEMOTHERAPY ALERT CARD or IMMUNOTHERAPY ALERT CARD at check-in to  the Emergency Department and triage nurse.  Should you have questions after your visit or need to cancel or reschedule your appointment, please contact [facility]  Dept: [phone]  and follow the prompts.  Office hours are 8:00 a.m. to 4:30 p.m. Monday - Friday. Please note that voicemails left after 4:00 p.m. may not be returned until the following business day.  We are closed weekends and major holidays. You have access to a nurse at all times for urgent questions. Please call the main number to the clinic Dept: [phone] and follow the prompts.   For any non-urgent questions, you may also contact your provider using MyChart. We now offer e-Visits for anyone 18 and older to request care online for non-urgent symptoms. For details visit [email or web address].   Also download the MyChart app! Go to the app store, search "MyChart", open the app, select [facility], and log in with your MyChart username and password.  Due to Covid, a mask is required upon entering the hospital/clinic. If you do not have a mask, one will be given to you upon arrival. For doctor visits, patients may have 1 support person aged 18 or older with them. For treatment visits, patients cannot have anyone with them due to current Covid guidelines and our immunocompromised population.   

## 2021-08-08 NOTE — Progress Notes (Signed)
[place] OFFICE PROGRESS NOTE  Patient Care Team: Default, Provider, MD as PCP - General  ASSESSMENT & PLAN:  Malignant neoplasm of cervix ([place]) She tolerated treatment well although her treatment course is complicated by progressive anemia We will continue treatment as scheduled today but I plan to give her a break after the third dose We will see if she can tolerate treatment 2 weeks on 1 week off versus 3 weeks on 1 week off next month I recommend minimum 2 to 3 months of treatment before repeating imaging study for objective assessment of response to therapy  Vitamin B12 deficiency She had history of B12 deficiency She will continue monthly B12 injection  Anemia due to antineoplastic chemotherapy This is likely due to recent treatment. The patient denies recent history of bleeding such as epistaxis, hematuria or hematochezia. She is asymptomatic from the anemia. I will observe for now.  She does not require transfusion now. I will continue the chemotherapy at current dose without dosage adjustment.  If the anemia gets progressive worse in the future, I might have to delay her treatment or adjust the chemotherapy dose.   No orders of the defined types were placed in this encounter.   All questions were answered. The patient knows to call the clinic with any problems, questions or concerns. The total time spent in the appointment was 20 minutes encounter with patients including review of chart and various tests results, discussions about plan of care and coordination of care plan   [name], MD 08/08/2021 4:27 PM  INTERVAL HISTORY: Please see below for problem oriented charting. she returns for treatment follow-up for cisplatin for recurrent cervical cancer She tolerated treatment well No recent nausea or peripheral neuropathy The patient denies any recent signs or symptoms of bleeding such as spontaneous epistaxis, hematuria or hematochezia.   REVIEW OF  SYSTEMS:   Constitutional: Denies fevers, chills or abnormal weight loss Eyes: Denies blurriness of vision Ears, nose, mouth, throat, and face: Denies mucositis or sore throat Respiratory: Denies cough, dyspnea or wheezes Cardiovascular: Denies palpitation, chest discomfort or lower extremity swelling Gastrointestinal:  Denies nausea, heartburn or change in bowel habits Skin: Denies abnormal skin rashes Lymphatics: Denies new lymphadenopathy or easy bruising Neurological:Denies numbness, tingling or new weaknesses Behavioral/Psych: Mood is stable, no new changes  All other systems were reviewed with the patient and are negative.  I have reviewed the past medical history, past surgical history, social history and family history with the patient and they are unchanged from previous note.  ALLERGIES:  has No Known Allergies.  MEDICATIONS:  Current Outpatient Medications  Medication Sig Dispense Refill   levothyroxine (SYNTHROID) 75 MCG tablet Take 1 tablet (75 mcg total) by mouth daily before breakfast. 30 tablet 3   lidocaine-prilocaine (EMLA) cream Apply to affected area once 30 g 3   Multiple Vitamin (MULTIVITAMIN) tablet Take 1 tablet by mouth daily.     ondansetron (ZOFRAN) 8 MG tablet Take 1 tablet (8 mg total) by mouth every 8 (eight) hours as needed. Start on the third day after cisplatin chemotherapy. 30 tablet 1   prochlorperazine (COMPAZINE) 10 MG tablet Take 1 tablet (10 mg total) by mouth every 6 (six) hours as needed (Nausea or vomiting). 30 tablet 1   No current facility-administered medications for this visit.   Facility-Administered Medications Ordered in Other Visits  Medication Dose Route Frequency Provider Last Rate Last Admin   sodium chloride flush (NS) 0.9 % injection 10 mL  10  mL Intracatheter PRN [name], MD   10 mL at 08/08/21 1538    SUMMARY OF ONCOLOGIC HISTORY: Oncology History Overview Note  Cancer Staging Malignant neoplasm of cervix ([place]) Staging  form: Cervix Uteri, AJCC 8th Edition - Clinical: Stage IIIB (cT3b, cN1, cM0) - Signed by [name], MD on 05/04/2018  PD-L1 1% Progressed on carboplatin and pembrolizumab   Malignant neoplasm of cervix ([place])  03/13/2018 Imaging   US pelvis There are mobile echogenic foci within the endometrial cavity suggestive of gas. This is nonspecific in etiology however may be secondary to endometritis. Correlate for history of recent instrumentation. This obscures visualization of the endometrial tissue and therefore a follow-up pelvic ultrasound in 2-4 weeks is recommended after resolution of the acute symptomatology if symptoms persist to further evaluate the endometrium.     03/13/2018 Initial Diagnosis   She initially presented with PMB   04/12/2018 Pathology Results   Cervix, biopsy, mass - INVASIVE SQUAMOUS CELL CARCINOMA - SEE COMMENT   04/12/2018 Surgery   PREOPERATIVE DIAGNOSIS:  Postmenopausal vaginal bleeding. POSTOPERATIVE DIAGNOSIS: The same PROCEDURE: Exam under anesthesia, pap smear, cervical mass biopsy SURGEON:  Dr. [name]   INDICATIONS: 70 y.o. yo [id number] with PMB here for exam under anesthesia.Risks of surgery were discussed with the patient including but not limited to: bleeding which may require transfusion; infection which may require antibiotics; injury to uterus or surrounding organs; need for additional procedures including laparotomy or laparoscopy; and other postoperative/anesthesia complications. Written informed consent was obtained.     FINDINGS:  An 8-week size midline uterus.  No adnexal mass palpable on exam. Normal cervix not visualized. Friable mass seen in involving the vagina at the level of the cervix obliterating the anterior and posterior fornix.   ANESTHESIA:   General INTRAVENOUS FLUIDS:  300 ml of LR ESTIMATED BLOOD LOSS: 20 ml. SPECIMENS: pap smear, cervical mass biopsy COMPLICATIONS:  None immediate.     04/25/2018 PET scan   1. Large  cervical mass may invade into the myometrium in down into the vagina, maximum SUV 21.3. There is a malignant left external iliac node measuring 1.4 cm in short axis with maximum SUV 14.1. 2. Accentuated activity in the cecum and ascending colon is likely physiologic given that it has no CT correlate. Correlation with the patient's colon cancer screening history is recommended. If screening is not up-to-date, appropriate screening should be considered. 3.  Aortic Atherosclerosis (ICD10-I70.0).     04/28/2018 Surgery   Pre-operative Diagnosis:  At least Stage 2B SCCa Cervical Cancer   Post-operative Diagnosis: At least clinical Stage 3A SCCa Cervical Cancer   Operation:  Exam under anesthesia due to intolerance for vaginal exam in office Cystoscopy due to concern for extension to posterior bladder (from anterior vaginal wall lesion)   Operative Findings:   Parametrial involvement on right  Right sided subvaginal tumor burden ~3cm. This approximates the right ureteral insertion into the bladder based on my exam during cystoscopy. Extension of disease down upper half of vagina on lateral and posterior walls. Extension of disease down to lower third of anterior vagina (versus skip lesion) thus making her Stage 3A Cystoscopy revealed patent bilateral ureteral orifices and no bladder mucosa invasion or areas of concern. Rectal exam grossly no disease.     05/04/2018 Cancer Staging   Staging form: Cervix Uteri, AJCC 8th Edition - Clinical: Stage IIIB (cT3b, cN1, cM0) - Signed by [name], MD on 05/04/2018    05/12/2018 Procedure   Successful 8 [place]  right internal jugular vein power port placement with its tip at the SVC/RA junction   05/13/2018 - 06/24/2018 Chemotherapy   The patient had weekly cisplatin given with concurrent radiation    05/17/2018 - 07/27/2018 Radiation Therapy   Radiation treatment dates:   05/17/2018-07/27/2018   Site/dose: 1. Cervix, 1.8 Gy in 25 fractions for a total  dose of 45 Gy                    2. Pelvis  Boost, 1.8 Gy in 5 fractions for a total dose of 9 Gy                    3. Cervix, 5.5 Gy in 5 fractions for a total dose of 27.5 Gy   06/03/2018 Adverse Reaction   She missed her chemo due to severe depression and was sent to the ER for suicidal ideation.  She was seen by psychiatrist.   10/27/2018 PET scan   IMPRESSION: 1. Complete metabolic response to therapy, without residual or recurrent hypermetabolic disease. 2.  Aortic Atherosclerosis (ICD10-I70.0).     11/17/2018 Imaging   Successful right IJ vein Port-A-Cath explant.   03/29/2020 PET scan   1. Hypermetabolic lesion along the right vaginal cuff is highly worrisome for recurrent cervical cancer. No evidence of distant metastatic disease. 2.  Aortic atherosclerosis (ICD10-I70.0).   04/26/2020 - 12/10/2020 Chemotherapy   The patient had CARBOplatin  for chemotherapy treatment.      Genetic Testing   Patient has genetic testing done for PD-L1. Results revealed patient has the following:  PD-L1 combined positive score (CPS): 1%   07/08/2020 Imaging   1. Unchanged post treatment appearance of the pelvis, including a soft tissue nodule in the low right hemipelvis adjacent to the vagina measuring 2.8 x 1.7 cm, previously FDG avid and consistent with malignancy. 2. No evidence of nodal or distant metastatic disease in the abdomen or pelvis. 3. Aortic Atherosclerosis (ICD10-I70.0).   10/14/2020 Imaging   1. Unchanged right eccentric soft tissue mass in the low right hemipelvis centered on the cervix and vagina, measuring 2.9 x 1.9 cm. 2. New small volume fluid within the endometrial cavity, abnormal in the postmenopausal setting and suggesting obstruction of the cervical os by soft tissue mass. 3. No evidence of lymphadenopathy or distant metastatic disease in the abdomen or pelvis.   Aortic Atherosclerosis (ICD10-I70.0).   12/24/2020 Imaging   1. Abnormal hypodense masslike appearance  continuous with hypodense masslike lesion probably centrally within a right eccentric cervix (versus right paracervical mass). The endometrial portion has enlarged compared to 10/14/2020 and active malignancy is suspected. 2. No adenopathy or findings of distant spread. 3. Other imaging findings of potential clinical significance: Aortic Atherosclerosis (ICD10-I70.0). Mild cardiomegaly. Stable scarring in the left lower lobe. Sigmoid colon scattered diverticula. Tarlov cysts at the S2 level. Vertical sclerosis in the sacral ala possibly due to stress fractures or prior radiation therapy.     01/06/2021 - 06/19/2021 Chemotherapy   Patient is on Treatment Plan : UTERINE Pembrolizumab q21d      04/07/2021 Imaging   1. The previously hypermetabolic right vaginal mass currently measures 12 cubic cm in volume, previously 11 cubic cm on 12/23/2020. 2. No new adenopathy or new findings of distant metastatic spread. 3. New wall thickening in several loops of distal ileum suspicious for inflammation/enteritis. 4. Other imaging findings of potential clinical significance: Mild cardiomegaly. Mild sigmoid colon diverticulosis. Stable bony demineralization with vertical sclerosis in the sacral  ala likely from old insufficiency fracture.   07/10/2021 Imaging   1. New moderate right-sided hydroureteronephrosis with decreased perfusion to the right kidney compatible with obstructive uropathy.  2. Interval progression of the heterogeneous low-density uterine lesion, with progression of enhancing soft tissue extending inferiorly to the right, compatible with previously described disease in the right vaginal wall. Represents the site of right ureteral obstruction. 3. No evidence for new metastatic disease in the abdomen or pelvis. 4. Bilateral sacral insufficiency fractures. 5. Trace free fluid in the pelvis. 6. Aortic Atherosclerosis (ICD10-I70.0).     07/22/2021 Procedure   Successful placement of a right  internal jugular approach power injectable Port-A-Cath.     07/24/2021 -  Chemotherapy   Patient is on Treatment Plan : cervical cancer Cisplatin q7d        PHYSICAL EXAMINATION: ECOG PERFORMANCE STATUS: 1 - Symptomatic but completely ambulatory  Vitals:   08/08/21 0817  BP: (!) 154/81  Pulse: 78  Resp: 16  Temp: 97.7 F (36.5 C)  SpO2: 100%   Filed Weights   08/08/21 0817  Weight: 92 lb 6.4 oz (41.9 kg)    GENERAL:alert, no distress and comfortable SKIN: skin color, texture, turgor are normal, no rashes or significant lesions EYES: normal, Conjunctiva are pink and non-injected, sclera clear OROPHARYNX:no exudate, no erythema and lips, buccal mucosa, and tongue normal  NECK: supple, thyroid normal size, non-tender, without nodularity LYMPH:  no palpable lymphadenopathy in the cervical, axillary or inguinal LUNGS: clear to auscultation and percussion with normal breathing effort HEART: regular rate & rhythm and no murmurs and no lower extremity edema ABDOMEN:abdomen soft, non-tender and normal bowel sounds Musculoskeletal:no cyanosis of digits and no clubbing  NEURO: alert & oriented x 3 with fluent speech, no focal motor/sensory deficits  LABORATORY DATA:  I have reviewed the data as listed    Component Value Date/Time   NA 138 08/08/2021 0801   K 3.8 08/08/2021 0801   CL 104 08/08/2021 0801   CO2 25 08/08/2021 0801   GLUCOSE 85 08/08/2021 0801   BUN 15 08/08/2021 0801   CREATININE 1.05 (H) 08/08/2021 0801   CREATININE 1.12 (H) 06/19/2021 0725   CALCIUM 8.3 (L) 08/08/2021 0801   PROT 7.0 08/08/2021 0801   ALBUMIN 4.1 08/08/2021 0801   AST 29 08/08/2021 0801   AST 24 06/19/2021 0725   ALT 25 08/08/2021 0801   ALT 10 06/19/2021 0725   ALKPHOS 60 08/08/2021 0801   BILITOT 0.3 08/08/2021 0801   BILITOT 0.5 06/19/2021 0725   GFRNONAA 58 (L) 08/08/2021 0801   GFRNONAA 53 (L) 06/19/2021 0725   GFRAA >60 06/14/2020 0844   GFRAA >60 03/11/2020 0951    No  results found for: SPEP, UPEP  Lab Results  Component Value Date   WBC 4.7 08/08/2021   NEUTROABS 3.6 08/08/2021   HGB 9.3 (L) 08/08/2021   HCT 28.4 (L) 08/08/2021   MCV 91.0 08/08/2021   PLT 150 08/08/2021      Chemistry      Component Value Date/Time   NA 138 08/08/2021 0801   K 3.8 08/08/2021 0801   CL 104 08/08/2021 0801   CO2 25 08/08/2021 0801   BUN 15 08/08/2021 0801   CREATININE 1.05 (H) 08/08/2021 0801   CREATININE 1.12 (H) 06/19/2021 0725      Component Value Date/Time   CALCIUM 8.3 (L) 08/08/2021 0801   ALKPHOS 60 08/08/2021 0801   AST 29 08/08/2021 0801   AST 24 06/19/2021 0725  ALT 25 08/08/2021 0801   ALT 10 06/19/2021 0725   BILITOT 0.3 08/08/2021 0801   BILITOT 0.5 06/19/2021 0725

## 2021-08-08 NOTE — Assessment & Plan Note (Signed)
She tolerated treatment well although her treatment course is complicated by progressive anemia We will continue treatment as scheduled today but I plan to give her a break after the third dose We will see if she can tolerate treatment 2 weeks on 1 week off versus 3 weeks on 1 week off next month I recommend minimum 2 to 3 months of treatment before repeating imaging study for objective assessment of response to therapy

## 2021-08-08 NOTE — Assessment & Plan Note (Signed)
She had history of B12 deficiency She will continue monthly B12 injection

## 2021-08-08 NOTE — Assessment & Plan Note (Signed)

## 2021-08-12 ENCOUNTER — Telehealth: Payer: Self-pay | Admitting: Nutrition

## 2021-08-12 ENCOUNTER — Encounter: Payer: Medicare Other | Admitting: Nutrition

## 2021-08-12 NOTE — Telephone Encounter (Signed)
Patient did not show up for scheduled nutrition appointment.  Contacted patient's home number and spoke with her friend, [name]. She reports patient is doing okay.  Patient refuses to eat regular meals but nibbles all day.  She does not drink boost.  She will not drink water or any other liquid.  It takes her 4 days to drink 1 Pepsi. [name] is frustrated with patient and has been encouraging her to drink more fluids however patient is refusing.  Last weight documented was 92.84 pounds November 25 which is improved from 85 pounds November 16.  I provided support and encouragement.  Patient is scheduled for RD follow-up during infusion on December 9 with Vinnie Level.

## 2021-08-21 ENCOUNTER — Other Ambulatory Visit: Payer: Self-pay

## 2021-08-21 ENCOUNTER — Encounter: Payer: Self-pay | Admitting: Hematology and Oncology

## 2021-08-21 ENCOUNTER — Other Ambulatory Visit: Payer: Self-pay | Admitting: Hematology and Oncology

## 2021-08-21 ENCOUNTER — Inpatient Hospital Stay: Payer: Medicare Other | Attending: Hematology and Oncology

## 2021-08-21 ENCOUNTER — Telehealth: Payer: Self-pay

## 2021-08-21 ENCOUNTER — Inpatient Hospital Stay (HOSPITAL_BASED_OUTPATIENT_CLINIC_OR_DEPARTMENT_OTHER): Payer: Medicare Other | Admitting: Hematology and Oncology

## 2021-08-21 DIAGNOSIS — I700 Atherosclerosis of aorta: Secondary | ICD-10-CM | POA: Diagnosis not present

## 2021-08-21 DIAGNOSIS — R64 Cachexia: Secondary | ICD-10-CM

## 2021-08-21 DIAGNOSIS — D61818 Other pancytopenia: Secondary | ICD-10-CM

## 2021-08-21 DIAGNOSIS — Z923 Personal history of irradiation: Secondary | ICD-10-CM | POA: Insufficient documentation

## 2021-08-21 DIAGNOSIS — N1330 Unspecified hydronephrosis: Secondary | ICD-10-CM | POA: Insufficient documentation

## 2021-08-21 DIAGNOSIS — C539 Malignant neoplasm of cervix uteri, unspecified: Secondary | ICD-10-CM | POA: Diagnosis not present

## 2021-08-21 DIAGNOSIS — Z79899 Other long term (current) drug therapy: Secondary | ICD-10-CM | POA: Insufficient documentation

## 2021-08-21 DIAGNOSIS — Z5111 Encounter for antineoplastic chemotherapy: Secondary | ICD-10-CM | POA: Insufficient documentation

## 2021-08-21 DIAGNOSIS — E039 Hypothyroidism, unspecified: Secondary | ICD-10-CM | POA: Insufficient documentation

## 2021-08-21 DIAGNOSIS — E538 Deficiency of other specified B group vitamins: Secondary | ICD-10-CM

## 2021-08-21 DIAGNOSIS — Z95828 Presence of other vascular implants and grafts: Secondary | ICD-10-CM

## 2021-08-21 DIAGNOSIS — Z9221 Personal history of antineoplastic chemotherapy: Secondary | ICD-10-CM | POA: Insufficient documentation

## 2021-08-21 DIAGNOSIS — Z7189 Other specified counseling: Secondary | ICD-10-CM

## 2021-08-21 LAB — CBC WITH DIFFERENTIAL (CANCER CENTER ONLY)
Abs Immature Granulocytes: 0.03 10*3/uL (ref 0.00–0.07)
Basophils Absolute: 0 10*3/uL (ref 0.0–0.1)
Basophils Relative: 1 %
Eosinophils Absolute: 0.1 10*3/uL (ref 0.0–0.5)
Eosinophils Relative: 1 %
HCT: 27.4 % — ABNORMAL LOW (ref 36.0–46.0)
Hemoglobin: 9 g/dL — ABNORMAL LOW (ref 12.0–15.0)
Immature Granulocytes: 1 %
Lymphocytes Relative: 19 %
Lymphs Abs: 0.7 10*3/uL (ref 0.7–4.0)
MCH: 30.5 pg (ref 26.0–34.0)
MCHC: 32.8 g/dL (ref 30.0–36.0)
MCV: 92.9 fL (ref 80.0–100.0)
Monocytes Absolute: 0.4 10*3/uL (ref 0.1–1.0)
Monocytes Relative: 10 %
Neutro Abs: 2.5 10*3/uL (ref 1.7–7.7)
Neutrophils Relative %: 68 %
Platelet Count: 96 10*3/uL — ABNORMAL LOW (ref 150–400)
RBC: 2.95 MIL/uL — ABNORMAL LOW (ref 3.87–5.11)
RDW: 15.3 % (ref 11.5–15.5)
WBC Count: 3.7 10*3/uL — ABNORMAL LOW (ref 4.0–10.5)
nRBC: 0 % (ref 0.0–0.2)

## 2021-08-21 LAB — COMPREHENSIVE METABOLIC PANEL
ALT: 9 U/L (ref 0–44)
AST: 17 U/L (ref 15–41)
Albumin: 4 g/dL (ref 3.5–5.0)
Alkaline Phosphatase: 67 U/L (ref 38–126)
Anion gap: 11 (ref 5–15)
BUN: 17 mg/dL (ref 8–23)
CO2: 23 mmol/L (ref 22–32)
Calcium: 8.4 mg/dL — ABNORMAL LOW (ref 8.9–10.3)
Chloride: 105 mmol/L (ref 98–111)
Creatinine, Ser: 1.14 mg/dL — ABNORMAL HIGH (ref 0.44–1.00)
GFR, Estimated: 52 mL/min — ABNORMAL LOW (ref >60)
Glucose, Bld: 82 mg/dL (ref 70–99)
Potassium: 3.8 mmol/L (ref 3.5–5.1)
Sodium: 139 mmol/L (ref 135–145)
Total Bilirubin: 0.4 mg/dL (ref 0.3–1.2)
Total Protein: 7 g/dL (ref 6.5–8.1)

## 2021-08-21 LAB — MAGNESIUM: Magnesium: 1.7 mg/dL (ref 1.7–2.4)

## 2021-08-21 LAB — TSH: TSH: 236.022 u[IU]/mL — ABNORMAL HIGH (ref 0.308–3.960)

## 2021-08-21 MED ORDER — LEVOTHYROXINE SODIUM 100 MCG PO TABS: 100.0000 ug | ORAL_TABLET | Freq: Every day | ORAL | 1 refills | Status: DC

## 2021-08-21 MED ORDER — HEPARIN SOD (PORK) LOCK FLUSH 100 UNIT/ML IV SOLN
500.0000 [IU] | Freq: Once | INTRAVENOUS | Status: AC
Start: 2021-08-21 — End: 2021-08-21
  Administered 2021-08-21: 500 [IU]

## 2021-08-21 MED ORDER — CYANOCOBALAMIN 1000 MCG/ML IJ SOLN: 1000.0000 ug | Freq: Once | INTRAMUSCULAR | Status: DC

## 2021-08-21 MED ORDER — SODIUM CHLORIDE 0.9% FLUSH
10.0000 mL | INTRAVENOUS | Status: DC | PRN
  Administered 2021-08-21: 10 mL via INTRAVENOUS

## 2021-08-21 MED FILL — Dexamethasone Sodium Phosphate Inj 100 MG/10ML: INTRAMUSCULAR | Qty: 1 | Status: AC

## 2021-08-21 MED FILL — Fosaprepitant Dimeglumine For IV Infusion 150 MG (Base Eq): INTRAVENOUS | Qty: 5 | Status: AC

## 2021-08-21 NOTE — Assessment & Plan Note (Signed)
She has developed hydronephrosis of the right kidney due to mechanical obstruction from her tumor Her renal function is stable I do not recommend stent placement at this point due to risk of complications We will monitor her renal function carefully

## 2021-08-21 NOTE — Progress Notes (Signed)
Erin Kent OFFICE PROGRESS NOTE  Patient Care Team: Default, Provider, MD as PCP - General  ASSESSMENT & PLAN:  Malignant neoplasm of cervix ([place]) She is getting progressively pancytopenic I plan to space out her future appointment In addition, I will reduce dose of treatment further We will proceed with mild pancytopenia tomorrow I recommend minimum 3 months of chemo before repeating CT imaging  Pancytopenia, acquired ([place]) Multifactorial, due to B12 deficiency, hypothyroidism and side effects from chemo Will proceed with chemo with dose reduction  Vitamin B12 deficiency She had history of B12 deficiency She will continue monthly B12 injection  Hydronephrosis of right kidney She has developed hydronephrosis of the right kidney due to mechanical obstruction from her tumor Her renal function is stable I do not recommend stent placement at this point due to risk of complications We will monitor her renal function carefully   Acquired hypothyroidism She had acquired hypothyroidism due to side effects of checkpoint inhibitors I plan to increase the dose of synthroid further I will continue to monitor her thyroid function carefully for the first few months while on treatment and adjust Synthroid dose as needed  Cachexia ([place]) She has lost more weight I will adjust the chemo dose accordingly She will see dietitian tomorrow  No orders of the defined types were placed in this encounter.   All questions were answered. The patient knows to call the clinic with any problems, questions or concerns. The total time spent in the appointment was 40 minutes encounter with patients including review of chart and various tests results, discussions about plan of care and coordination of care plan   [name], MD 08/21/2021 7:34 PM  INTERVAL HISTORY: Please see below for problem oriented charting. she returns for treatment follow-up to be seen prior to cisplatin for  recurrent cervical cancer She has lost weight but claimed she is eating normal She denies nausea or neuropathy No recent infection Denies pelvic pain or bleeding She is taking her thyroid supplement  REVIEW OF SYSTEMS:   Constitutional: Denies fevers, chills or abnormal weight loss Eyes: Denies blurriness of vision Ears, nose, mouth, throat, and face: Denies mucositis or sore throat Respiratory: Denies cough, dyspnea or wheezes Cardiovascular: Denies palpitation, chest discomfort or lower extremity swelling Gastrointestinal:  Denies nausea, heartburn or change in bowel habits Skin: Denies abnormal skin rashes Lymphatics: Denies new lymphadenopathy or easy bruising Neurological:Denies numbness, tingling or new weaknesses Behavioral/Psych: Mood is stable, no new changes  All other systems were reviewed with the patient and are negative.  I have reviewed the past medical history, past surgical history, social history and family history with the patient and they are unchanged from previous note.  ALLERGIES:  has No Known Allergies.  MEDICATIONS:  Current Outpatient Medications  Medication Sig Dispense Refill   levothyroxine (SYNTHROID) 100 MCG tablet Take 1 tablet (100 mcg total) by mouth daily before breakfast. 30 tablet 1   lidocaine-prilocaine (EMLA) cream Apply to affected area once 30 g 3   Multiple Vitamin (MULTIVITAMIN) tablet Take 1 tablet by mouth daily.     ondansetron (ZOFRAN) 8 MG tablet Take 1 tablet (8 mg total) by mouth every 8 (eight) hours as needed. Start on the third day after cisplatin chemotherapy. 30 tablet 1   prochlorperazine (COMPAZINE) 10 MG tablet Take 1 tablet (10 mg total) by mouth every 6 (six) hours as needed (Nausea or vomiting). 30 tablet 1   No current facility-administered medications for this visit.    SUMMARY  OF ONCOLOGIC HISTORY: Oncology History Overview Note  Cancer Staging Malignant neoplasm of cervix ([place]) Staging form: Cervix Uteri, AJCC  8th Edition - Clinical: Stage IIIB (cT3b, cN1, cM0) - Signed by [name], MD on 05/04/2018  PD-L1 1% Progressed on carboplatin and pembrolizumab   Malignant neoplasm of cervix ([place])  03/13/2018 Imaging   US pelvis There are mobile echogenic foci within the endometrial cavity suggestive of gas. This is nonspecific in etiology however may be secondary to endometritis. Correlate for history of recent instrumentation. This obscures visualization of the endometrial tissue and therefore a follow-up pelvic ultrasound in 2-4 weeks is recommended after resolution of the acute symptomatology if symptoms persist to further evaluate the endometrium.     03/13/2018 Initial Diagnosis   She initially presented with PMB   04/12/2018 Pathology Results   Cervix, biopsy, mass - INVASIVE SQUAMOUS CELL CARCINOMA - SEE COMMENT   04/12/2018 Surgery   PREOPERATIVE DIAGNOSIS:  Postmenopausal vaginal bleeding. POSTOPERATIVE DIAGNOSIS: The same PROCEDURE: Exam under anesthesia, pap smear, cervical mass biopsy SURGEON:  Dr. [name]   INDICATIONS: 70 y.o. yo G0P0000 with PMB here for exam under anesthesia.Risks of surgery were discussed with the patient including but not limited to: bleeding which may require transfusion; infection which may require antibiotics; injury to uterus or surrounding organs; need for additional procedures including laparotomy or laparoscopy; and other postoperative/anesthesia complications. Written informed consent was obtained.     FINDINGS:  An 8-week size midline uterus.  No adnexal mass palpable on exam. Normal cervix not visualized. Friable mass seen in involving the vagina at the level of the cervix obliterating the anterior and posterior fornix.   ANESTHESIA:   General INTRAVENOUS FLUIDS:  300 ml of LR ESTIMATED BLOOD LOSS: 20 ml. SPECIMENS: pap smear, cervical mass biopsy COMPLICATIONS:  None immediate.     04/25/2018 PET scan   1. Large cervical mass may invade into  the myometrium in down into the vagina, maximum SUV 21.3. There is a malignant left external iliac node measuring 1.4 cm in short axis with maximum SUV 14.1. 2. Accentuated activity in the cecum and ascending colon is likely physiologic given that it has no CT correlate. Correlation with the patient's colon cancer screening history is recommended. If screening is not up-to-date, appropriate screening should be considered. 3.  Aortic Atherosclerosis (ICD10-I70.0).     04/28/2018 Surgery   Pre-operative Diagnosis:  At least Stage 2B SCCa Cervical Cancer   Post-operative Diagnosis: At least clinical Stage 3A SCCa Cervical Cancer   Operation:  Exam under anesthesia due to intolerance for vaginal exam in office Cystoscopy due to concern for extension to posterior bladder (from anterior vaginal wall lesion)   Operative Findings:   Parametrial involvement on right  Right sided subvaginal tumor burden ~3cm. This approximates the right ureteral insertion into the bladder based on my exam during cystoscopy. Extension of disease down upper half of vagina on lateral and posterior walls. Extension of disease down to lower third of anterior vagina (versus skip lesion) thus making her Stage 3A Cystoscopy revealed patent bilateral ureteral orifices and no bladder mucosa invasion or areas of concern. Rectal exam grossly no disease.     05/04/2018 Cancer Staging   Staging form: Cervix Uteri, AJCC 8th Edition - Clinical: Stage IIIB (cT3b, cN1, cM0) - Signed by [name], MD on 05/04/2018    05/12/2018 Procedure   Successful 8 French right internal jugular vein power port placement with its tip at the SVC/RA junction   05/13/2018 -  06/24/2018 Chemotherapy   The patient had weekly cisplatin given with concurrent radiation    05/17/2018 - 07/27/2018 Radiation Therapy   Radiation treatment dates:   05/17/2018-07/27/2018   Site/dose: 1. Cervix, 1.8 Gy in 25 fractions for a total dose of 45 Gy                     2. Pelvis  Boost, 1.8 Gy in 5 fractions for a total dose of 9 Gy                    3. Cervix, 5.5 Gy in 5 fractions for a total dose of 27.5 Gy   06/03/2018 Adverse Reaction   She missed her chemo due to severe depression and was sent to the ER for suicidal ideation.  She was seen by psychiatrist.   10/27/2018 PET scan   IMPRESSION: 1. Complete metabolic response to therapy, without residual or recurrent hypermetabolic disease. 2.  Aortic Atherosclerosis (ICD10-I70.0).     11/17/2018 Imaging   Successful right IJ vein Port-A-Cath explant.   03/29/2020 PET scan   1. Hypermetabolic lesion along the right vaginal cuff is highly worrisome for recurrent cervical cancer. No evidence of distant metastatic disease. 2.  Aortic atherosclerosis (ICD10-I70.0).   04/26/2020 - 12/10/2020 Chemotherapy   The patient had CARBOplatin  for chemotherapy treatment.      Genetic Testing   Patient has genetic testing done for PD-L1. Results revealed patient has the following:  PD-L1 combined positive score (CPS): 1%   07/08/2020 Imaging   1. Unchanged post treatment appearance of the pelvis, including a soft tissue nodule in the low right hemipelvis adjacent to the vagina measuring 2.8 x 1.7 cm, previously FDG avid and consistent with malignancy. 2. No evidence of nodal or distant metastatic disease in the abdomen or pelvis. 3. Aortic Atherosclerosis (ICD10-I70.0).   10/14/2020 Imaging   1. Unchanged right eccentric soft tissue mass in the low right hemipelvis centered on the cervix and vagina, measuring 2.9 x 1.9 cm. 2. New small volume fluid within the endometrial cavity, abnormal in the postmenopausal setting and suggesting obstruction of the cervical os by soft tissue mass. 3. No evidence of lymphadenopathy or distant metastatic disease in the abdomen or pelvis.   Aortic Atherosclerosis (ICD10-I70.0).   12/24/2020 Imaging   1. Abnormal hypodense masslike appearance continuous with hypodense  masslike lesion probably centrally within a right eccentric cervix (versus right paracervical mass). The endometrial portion has enlarged compared to 10/14/2020 and active malignancy is suspected. 2. No adenopathy or findings of distant spread. 3. Other imaging findings of potential clinical significance: Aortic Atherosclerosis (ICD10-I70.0). Mild cardiomegaly. Stable scarring in the left lower lobe. Sigmoid colon scattered diverticula. Tarlov cysts at the S2 level. Vertical sclerosis in the sacral ala possibly due to stress fractures or prior radiation therapy.     01/06/2021 - 06/19/2021 Chemotherapy   Patient is on Treatment Plan : UTERINE Pembrolizumab q21d      04/07/2021 Imaging   1. The previously hypermetabolic right vaginal mass currently measures 12 cubic cm in volume, previously 11 cubic cm on 12/23/2020. 2. No new adenopathy or new findings of distant metastatic spread. 3. New wall thickening in several loops of distal ileum suspicious for inflammation/enteritis. 4. Other imaging findings of potential clinical significance: Mild cardiomegaly. Mild sigmoid colon diverticulosis. Stable bony demineralization with vertical sclerosis in the sacral ala likely from old insufficiency fracture.   07/10/2021 Imaging   1. New moderate right-sided hydroureteronephrosis with  decreased perfusion to the right kidney compatible with obstructive uropathy.  2. Interval progression of the heterogeneous low-density uterine lesion, with progression of enhancing soft tissue extending inferiorly to the right, compatible with previously described disease in the right vaginal wall. Represents the site of right ureteral obstruction. 3. No evidence for new metastatic disease in the abdomen or pelvis. 4. Bilateral sacral insufficiency fractures. 5. Trace free fluid in the pelvis. 6. Aortic Atherosclerosis (ICD10-I70.0).     07/22/2021 Procedure   Successful placement of a right internal jugular approach power  injectable Port-A-Cath.     07/24/2021 -  Chemotherapy   Patient is on Treatment Plan : cervical cancer Cisplatin q7d        PHYSICAL EXAMINATION: ECOG PERFORMANCE STATUS: 1 - Symptomatic but completely ambulatory  Vitals:   08/21/21 1014  BP: (!) 137/98  Pulse: 78  Resp: 18  Temp: 97.8 F (36.6 C)  SpO2: 99%   Filed Weights   08/21/21 1014  Weight: 90 lb 1.6 oz (40.9 kg)    GENERAL:alert, no distress and comfortable SKIN: skin color, texture, turgor are normal, no rashes or significant lesions EYES: normal, Conjunctiva are pink and non-injected, sclera clear OROPHARYNX:no exudate, no erythema and lips, buccal mucosa, and tongue normal  NECK: supple, thyroid normal size, non-tender, without nodularity LYMPH:  no palpable lymphadenopathy in the cervical, axillary or inguinal LUNGS: clear to auscultation and percussion with normal breathing effort HEART: regular rate & rhythm and no murmurs and no lower extremity edema ABDOMEN:abdomen soft, non-tender and normal bowel sounds Musculoskeletal:no cyanosis of digits and no clubbing  NEURO: alert & oriented x 3 with fluent speech, no focal motor/sensory deficits  LABORATORY DATA:  I have reviewed the data as listed    Component Value Date/Time   NA 139 08/21/2021 0934   K 3.8 08/21/2021 0934   CL 105 08/21/2021 0934   CO2 23 08/21/2021 0934   GLUCOSE 82 08/21/2021 0934   BUN 17 08/21/2021 0934   CREATININE 1.14 (H) 08/21/2021 0934   CREATININE 1.12 (H) 06/19/2021 0725   CALCIUM 8.4 (L) 08/21/2021 0934   PROT 7.0 08/21/2021 0934   ALBUMIN 4.0 08/21/2021 0934   AST 17 08/21/2021 0934   AST 24 06/19/2021 0725   ALT 9 08/21/2021 0934   ALT 10 06/19/2021 0725   ALKPHOS 67 08/21/2021 0934   BILITOT 0.4 08/21/2021 0934   BILITOT 0.5 06/19/2021 0725   GFRNONAA 52 (L) 08/21/2021 0934   GFRNONAA 53 (L) 06/19/2021 0725   GFRAA >60 06/14/2020 0844   GFRAA >60 03/11/2020 0951    No results found for: SPEP, UPEP  Lab  Results  Component Value Date   WBC 3.7 (L) 08/21/2021   NEUTROABS 2.5 08/21/2021   HGB 9.0 (L) 08/21/2021   HCT 27.4 (L) 08/21/2021   MCV 92.9 08/21/2021   PLT 96 (L) 08/21/2021      Chemistry      Component Value Date/Time   NA 139 08/21/2021 0934   K 3.8 08/21/2021 0934   CL 105 08/21/2021 0934   CO2 23 08/21/2021 0934   BUN 17 08/21/2021 0934   CREATININE 1.14 (H) 08/21/2021 0934   CREATININE 1.12 (H) 06/19/2021 0725      Component Value Date/Time   CALCIUM 8.4 (L) 08/21/2021 0934   ALKPHOS 67 08/21/2021 0934   AST 17 08/21/2021 0934   AST 24 06/19/2021 0725   ALT 9 08/21/2021 0934   ALT 10 06/19/2021 0725   BILITOT 0.4 08/21/2021 0934  BILITOT 0.5 06/19/2021 0725

## 2021-08-21 NOTE — Assessment & Plan Note (Signed)
Multifactorial, due to B12 deficiency, hypothyroidism and side effects from chemo Will proceed with chemo with dose reduction

## 2021-08-21 NOTE — Telephone Encounter (Signed)
Called regarding missed appts this morning. Per Mel Almond and Beverly they received a call that appts where changed to tomorrow at 0730. They will in now for appts.

## 2021-08-21 NOTE — Telephone Encounter (Signed)
Called and spoke with [facility]. TSH is very high. Instructed to take synthroid in the morning 30 mins before food. Erin Kent said she is not sure if she if she is taking it correctly. She will remind Erin Kent and pick up the new Rx a pharmacy.

## 2021-08-21 NOTE — Assessment & Plan Note (Signed)
She is getting progressively pancytopenic I plan to space out her future appointment In addition, I will reduce dose of treatment further We will proceed with mild pancytopenia tomorrow I recommend minimum 3 months of chemo before repeating CT imaging

## 2021-08-21 NOTE — Assessment & Plan Note (Signed)
She has lost more weight I will adjust the chemo dose accordingly She will see dietitian tomorrow

## 2021-08-21 NOTE — Assessment & Plan Note (Signed)
She had acquired hypothyroidism due to side effects of checkpoint inhibitors I plan to increase the dose of synthroid further I will continue to monitor her thyroid function carefully for the first few months while on treatment and adjust Synthroid dose as needed

## 2021-08-21 NOTE — Assessment & Plan Note (Signed)
She had history of B12 deficiency She will continue monthly B12 injection

## 2021-08-22 ENCOUNTER — Inpatient Hospital Stay: Payer: Medicare Other | Admitting: Dietician

## 2021-08-22 ENCOUNTER — Inpatient Hospital Stay: Payer: Medicare Other

## 2021-08-22 VITALS — BP 130/71 | HR 73 | Temp 98.4°F | Resp 18

## 2021-08-22 DIAGNOSIS — Z5111 Encounter for antineoplastic chemotherapy: Secondary | ICD-10-CM | POA: Diagnosis not present

## 2021-08-22 DIAGNOSIS — E538 Deficiency of other specified B group vitamins: Secondary | ICD-10-CM

## 2021-08-22 DIAGNOSIS — D61818 Other pancytopenia: Secondary | ICD-10-CM

## 2021-08-22 DIAGNOSIS — Z7189 Other specified counseling: Secondary | ICD-10-CM

## 2021-08-22 DIAGNOSIS — C539 Malignant neoplasm of cervix uteri, unspecified: Secondary | ICD-10-CM

## 2021-08-22 MED ORDER — HEPARIN SOD (PORK) LOCK FLUSH 100 UNIT/ML IV SOLN
500.0000 [IU] | Freq: Once | INTRAVENOUS | Status: AC | PRN
  Administered 2021-08-22: 500 [IU]

## 2021-08-22 MED ORDER — POTASSIUM CHLORIDE IN NACL 20-0.9 MEQ/L-% IV SOLN
Freq: Once | INTRAVENOUS | Status: AC
  Filled 2021-08-22: qty 1000

## 2021-08-22 MED ORDER — PALONOSETRON HCL INJECTION 0.25 MG/5ML
0.2500 mg | Freq: Once | INTRAVENOUS | Status: AC
  Administered 2021-08-22: 0.25 mg via INTRAVENOUS
  Filled 2021-08-22: qty 5

## 2021-08-22 MED ORDER — SODIUM CHLORIDE 0.9 % IV SOLN: Freq: Once | INTRAVENOUS | Status: AC

## 2021-08-22 MED ORDER — SODIUM CHLORIDE 0.9% FLUSH
10.0000 mL | INTRAVENOUS | Status: DC | PRN
  Administered 2021-08-22: 10 mL

## 2021-08-22 MED ORDER — SODIUM CHLORIDE 0.9 % IV SOLN
150.0000 mg | Freq: Once | INTRAVENOUS | Status: AC
  Administered 2021-08-22: 150 mg via INTRAVENOUS
  Filled 2021-08-22: qty 150

## 2021-08-22 MED ORDER — SODIUM CHLORIDE 0.9 % IV SOLN
10.0000 mg | Freq: Once | INTRAVENOUS | Status: AC
  Administered 2021-08-22: 10 mg via INTRAVENOUS
  Filled 2021-08-22: qty 10

## 2021-08-22 MED ORDER — SODIUM CHLORIDE 0.9 % IV SOLN
24.5000 mg/m2 | Freq: Once | INTRAVENOUS | Status: AC
  Administered 2021-08-22: 34 mg via INTRAVENOUS
  Filled 2021-08-22: qty 34

## 2021-08-22 MED ORDER — MAGNESIUM SULFATE 2 GM/50ML IV SOLN
2.0000 g | Freq: Once | INTRAVENOUS | Status: AC
  Administered 2021-08-22: 2 g via INTRAVENOUS
  Filled 2021-08-22: qty 50

## 2021-08-22 MED ORDER — CYANOCOBALAMIN 1000 MCG/ML IJ SOLN
1000.0000 ug | Freq: Once | INTRAMUSCULAR | Status: AC
  Administered 2021-08-22: 1000 ug via INTRAMUSCULAR
  Filled 2021-08-22: qty 1

## 2021-08-22 NOTE — Patient Instructions (Signed)
[facility] MEDICAL [facility]COL[facility]  Discharge Instructions: Thank you for choosing [facility] to provide your oncology and hematology care.   If you have a lab appointment with the Cancer Center, please go directly to the Cancer Center and check in at the registration area.   Wear comfortable clothing and clothing appropriate for easy access to any Portacath or PICC line.   We strive to give you quality time with your provider. You may need to reschedule your appointment if you arrive late (15 or more minutes).  Arriving late affects you and other patients whose appointments are after yours.  Also, if you miss three or more appointments without notifying the office, you may be dismissed from the clinic at the provider's discretion.      For prescription refill requests, have your pharmacy contact our office and allow 72 hours for refills to be completed.    Today you received the following chemotherapy and/or immunotherapy agents cisplatin   To help prevent nausea and vomiting after your treatment, we encourage you to take your nausea medication as directed.  BELOW ARE SYMPTOMS THAT SHOULD BE REPORTED IMMEDIATELY: *FEVER GREATER THAN 100.4 F (38 C) OR HIGHER *CHILLS OR SWEATING *NAUSEA AND VOMITING THAT IS NOT CONTROLLED WITH YOUR NAUSEA MEDICATION *UNUSUAL SHORTNESS OF BREATH *UNUSUAL BRUISING OR BLEEDING *URINARY PROBLEMS (pain or burning when urinating, or frequent urination) *BOWEL PROBLEMS (unusual diarrhea, constipation, pain near the anus) TENDERNESS IN MOUTH AND THROAT WITH OR WITHOUT PRESENCE OF ULCERS (sore throat, sores in mouth, or a toothache) UNUSUAL RASH, SWELLING OR PAIN  UNUSUAL VAGINAL DISCHARGE OR ITCHING   Items with * indicate a potential emergency and should be followed up as soon as possible or go to the Emergency Department if any problems should occur.  Please show the CHEMOTHERAPY ALERT CARD or IMMUNOTHERAPY ALERT CARD at check-in to the  Emergency Department and triage nurse.  Should you have questions after your visit or need to cancel or reschedule your appointment, please contact [facility]  Dept: [phone]  and follow the prompts.  Office hours are 8:00 a.m. to 4:30 p.m. Monday - Friday. Please note that voicemails left after 4:00 p.m. may not be returned until the following business day.  We are closed weekends and major holidays. You have access to a nurse at all times for urgent questions. Please call the main number to the clinic Dept: [phone] and follow the prompts.   For any non-urgent questions, you may also contact your provider using MyChart. We now offer e-Visits for anyone 18 and older to request care online for non-urgent symptoms. For details visit [email or web address].   Also download the MyChart app! Go to the app store, search "MyChart", open the app, select [facility], and log in with your MyChart username and password.  Due to Covid, a mask is required upon entering the hospital/clinic. If you do not have a mask, one will be given to you upon arrival. For doctor visits, patients may have 1 support person aged 18 or older with them. For treatment visits, patients cannot have anyone with them due to current Covid guidelines and our immunocompromised population.   

## 2021-08-22 NOTE — Progress Notes (Signed)
Nutrition Follow-up:  Patient receiving weekly cisplatin for recurrent cervical cancer.   Met with patient during infusion. She is sipping on a soda, reports eating a ham sandwich prior to visit. Patient reports she liked the snack ideas shared with her at initial visit, but she has not tried anything. Patient reports she and [name] will get a few items next week at the grocery store. Patient reports she has been eating more, recalls 6 inch chicken sub from [facility] that she ate over the course of one day, she also had fries and a salad. Patient is been working to increase her water intake, reports [name] keeps bottles at the house and this has been helpful. Patient has not been drinking Boost recently, she likes the strawberry flavor and unable to find these at the store. She is agreeable to trying strawberry Ensure Complete later. Patient wanted to eat the donut brought to her first.   Medications: reviewed  Labs: reviewed   Anthropometrics: Last weight 90 lb 1.6 oz on 12/8 decreased from 92 lb 6.4 oz on 11/25  11/10 - 89 lb 9 oz 10/28 - 94 lb 3.2 oz   NUTRITION DIAGNOSIS: Food and nutrition knowledge related deficit ongoing   INTERVENTION:  Encouraged high calorie, high protein foods and suggested pt bring handout when grocery shopping for reference Drink at least one Boost Plus/equivalent day, suggested using strawberry syrup in vanilla to make flavors she enjoys - Boost coupons provided Patient provided sample of Ensure Complete to try Patient will continue working to drink more fluids Provided support and encouragement Patient has contact information     MONITORING, EVALUATION, GOAL: weight trends, intake    NEXT VISIT: Friday December 30 during infusion

## 2021-08-29 ENCOUNTER — Ambulatory Visit: Payer: Medicare Other

## 2021-08-29 ENCOUNTER — Ambulatory Visit: Payer: Medicare Other | Admitting: Hematology and Oncology

## 2021-08-29 ENCOUNTER — Other Ambulatory Visit: Payer: Medicare Other

## 2021-09-11 ENCOUNTER — Other Ambulatory Visit: Payer: Self-pay

## 2021-09-11 ENCOUNTER — Inpatient Hospital Stay (HOSPITAL_BASED_OUTPATIENT_CLINIC_OR_DEPARTMENT_OTHER): Payer: Medicare Other | Admitting: Hematology and Oncology

## 2021-09-11 ENCOUNTER — Inpatient Hospital Stay: Payer: Medicare Other

## 2021-09-11 ENCOUNTER — Encounter: Payer: Self-pay | Admitting: Hematology and Oncology

## 2021-09-11 DIAGNOSIS — Z7189 Other specified counseling: Secondary | ICD-10-CM

## 2021-09-11 DIAGNOSIS — D61818 Other pancytopenia: Secondary | ICD-10-CM | POA: Diagnosis not present

## 2021-09-11 DIAGNOSIS — Z5111 Encounter for antineoplastic chemotherapy: Secondary | ICD-10-CM | POA: Diagnosis not present

## 2021-09-11 DIAGNOSIS — C539 Malignant neoplasm of cervix uteri, unspecified: Secondary | ICD-10-CM

## 2021-09-11 LAB — CBC WITH DIFFERENTIAL/PLATELET
Abs Immature Granulocytes: 0.05 10*3/uL (ref 0.00–0.07)
Basophils Absolute: 0 10*3/uL (ref 0.0–0.1)
Basophils Relative: 1 %
Eosinophils Absolute: 0 10*3/uL (ref 0.0–0.5)
Eosinophils Relative: 0 %
HCT: 28 % — ABNORMAL LOW (ref 36.0–46.0)
Hemoglobin: 9.4 g/dL — ABNORMAL LOW (ref 12.0–15.0)
Immature Granulocytes: 2 %
Lymphocytes Relative: 22 %
Lymphs Abs: 0.7 10*3/uL (ref 0.7–4.0)
MCH: 32.3 pg (ref 26.0–34.0)
MCHC: 33.6 g/dL (ref 30.0–36.0)
MCV: 96.2 fL (ref 80.0–100.0)
Monocytes Absolute: 0.5 10*3/uL (ref 0.1–1.0)
Monocytes Relative: 18 %
Neutro Abs: 1.7 10*3/uL (ref 1.7–7.7)
Neutrophils Relative %: 57 %
Platelets: 200 10*3/uL (ref 150–400)
RBC: 2.91 MIL/uL — ABNORMAL LOW (ref 3.87–5.11)
RDW: 16.9 % — ABNORMAL HIGH (ref 11.5–15.5)
WBC: 2.9 10*3/uL — ABNORMAL LOW (ref 4.0–10.5)
nRBC: 0 % (ref 0.0–0.2)

## 2021-09-11 LAB — COMPREHENSIVE METABOLIC PANEL
ALT: 11 U/L (ref 0–44)
AST: 16 U/L (ref 15–41)
Albumin: 4.2 g/dL (ref 3.5–5.0)
Alkaline Phosphatase: 65 U/L (ref 38–126)
Anion gap: 7 (ref 5–15)
BUN: 22 mg/dL (ref 8–23)
CO2: 24 mmol/L (ref 22–32)
Calcium: 8.9 mg/dL (ref 8.9–10.3)
Chloride: 106 mmol/L (ref 98–111)
Creatinine, Ser: 1.25 mg/dL — ABNORMAL HIGH (ref 0.44–1.00)
GFR, Estimated: 47 mL/min — ABNORMAL LOW (ref >60)
Glucose, Bld: 97 mg/dL (ref 70–99)
Potassium: 4.3 mmol/L (ref 3.5–5.1)
Sodium: 137 mmol/L (ref 135–145)
Total Bilirubin: 0.3 mg/dL (ref 0.3–1.2)
Total Protein: 7.2 g/dL (ref 6.5–8.1)

## 2021-09-11 LAB — MAGNESIUM: Magnesium: 2 mg/dL (ref 1.7–2.4)

## 2021-09-11 NOTE — Assessment & Plan Note (Signed)
It took the patient over 3 weeks to recover from pancytopenia from her last dose of treatment The dose of her treatment was reduced further and she tolerated that well We will proceed with treatment as scheduled tomorrow After next month's dose, we will order imaging study for objective assessment of response to therapy

## 2021-09-11 NOTE — Progress Notes (Signed)
[facility] OFFICE PROGRESS NOTE  Patient Care Team: Default, Provider, MD as PCP - General  ASSESSMENT & PLAN:  Malignant neoplasm of cervix ([place]) It took the patient over 3 weeks to recover from pancytopenia from her last dose of treatment The dose of her treatment was reduced further and she tolerated that well We will proceed with treatment as scheduled tomorrow After next month's dose, we will order imaging study for objective assessment of response to therapy  Pancytopenia, acquired ([place]) Multifactorial, due to B12 deficiency, hypothyroidism and side effects from chemo Will proceed with chemo with similar dose reduction as before  Orders Placed This Encounter  Procedures   CBC with Differential/Platelet    All questions were answered. The patient knows to call the clinic with any problems, questions or concerns. The total time spent in the appointment was 20 minutes encounter with patients including review of chart and various tests results, discussions about plan of care and coordination of care plan   [name], MD 09/11/2021 2:34 PM  INTERVAL HISTORY: Please see below for problem oriented charting. she returns for treatment follow-up on single agent cisplatin for recurrent cervical cancer Since last time I saw her, she feels well No recent pelvic pain or bleeding No recent infection  REVIEW OF SYSTEMS:   Constitutional: Denies fevers, chills or abnormal weight loss Eyes: Denies blurriness of vision Ears, nose, mouth, throat, and face: Denies mucositis or sore throat Respiratory: Denies cough, dyspnea or wheezes Cardiovascular: Denies palpitation, chest discomfort or lower extremity swelling Gastrointestinal:  Denies nausea, heartburn or change in bowel habits Skin: Denies abnormal skin rashes Lymphatics: Denies new lymphadenopathy or easy bruising Neurological:Denies numbness, tingling or new weaknesses Behavioral/Psych: Mood is stable, no new  changes  All other systems were reviewed with the patient and are negative.  I have reviewed the past medical history, past surgical history, social history and family history with the patient and they are unchanged from previous note.  ALLERGIES:  has No Known Allergies.  MEDICATIONS:  Current Outpatient Medications  Medication Sig Dispense Refill   levothyroxine (SYNTHROID) 100 MCG tablet Take 1 tablet (100 mcg total) by mouth daily before breakfast. 30 tablet 1   lidocaine-prilocaine (EMLA) cream Apply to affected area once 30 g 3   Multiple Vitamin (MULTIVITAMIN) tablet Take 1 tablet by mouth daily.     ondansetron (ZOFRAN) 8 MG tablet Take 1 tablet (8 mg total) by mouth every 8 (eight) hours as needed. Start on the third day after cisplatin chemotherapy. 30 tablet 1   prochlorperazine (COMPAZINE) 10 MG tablet Take 1 tablet (10 mg total) by mouth every 6 (six) hours as needed (Nausea or vomiting). 30 tablet 1   No current facility-administered medications for this visit.    SUMMARY OF ONCOLOGIC HISTORY: Oncology History Overview Note  Cancer Staging Malignant neoplasm of cervix ([place]) Staging form: Cervix Uteri, AJCC 8th Edition - Clinical: Stage IIIB (cT3b, cN1, cM0) - Signed by [name], MD on 05/04/2018  PD-L1 1% Progressed on carboplatin and pembrolizumab   Malignant neoplasm of cervix ([place])  03/13/2018 Imaging   US pelvis There are mobile echogenic foci within the endometrial cavity suggestive of gas. This is nonspecific in etiology however may be secondary to endometritis. Correlate for history of recent instrumentation. This obscures visualization of the endometrial tissue and therefore a follow-up pelvic ultrasound in 2-4 weeks is recommended after resolution of the acute symptomatology if symptoms persist to further evaluate the endometrium.  03/13/2018 Initial Diagnosis   She initially presented with PMB   04/12/2018 Pathology Results   Cervix, biopsy, mass -  INVASIVE SQUAMOUS CELL CARCINOMA - SEE COMMENT   04/12/2018 Surgery   PREOPERATIVE DIAGNOSIS:  Postmenopausal vaginal bleeding. POSTOPERATIVE DIAGNOSIS: The same PROCEDURE: Exam under anesthesia, pap smear, cervical mass biopsy SURGEON:  Dr. [name]   INDICATIONS: 70 y.o. yo [id number] with PMB here for exam under anesthesia.Risks of surgery were discussed with the patient including but not limited to: bleeding which may require transfusion; infection which may require antibiotics; injury to uterus or surrounding organs; need for additional procedures including laparotomy or laparoscopy; and other postoperative/anesthesia complications. Written informed consent was obtained.     FINDINGS:  An 8-week size midline uterus.  No adnexal mass palpable on exam. Normal cervix not visualized. Friable mass seen in involving the vagina at the level of the cervix obliterating the anterior and posterior fornix.   ANESTHESIA:   General INTRAVENOUS FLUIDS:  300 ml of LR ESTIMATED BLOOD LOSS: 20 ml. SPECIMENS: pap smear, cervical mass biopsy COMPLICATIONS:  None immediate.     04/25/2018 PET scan   1. Large cervical mass may invade into the myometrium in down into the vagina, maximum SUV 21.3. There is a malignant left external iliac node measuring 1.4 cm in short axis with maximum SUV 14.1. 2. Accentuated activity in the cecum and ascending colon is likely physiologic given that it has no CT correlate. Correlation with the patient's colon cancer screening history is recommended. If screening is not up-to-date, appropriate screening should be considered. 3.  Aortic Atherosclerosis (ICD10-I70.0).     04/28/2018 Surgery   Pre-operative Diagnosis:  At least Stage 2B SCCa Cervical Cancer   Post-operative Diagnosis: At least clinical Stage 3A SCCa Cervical Cancer   Operation:  Exam under anesthesia due to intolerance for vaginal exam in office Cystoscopy due to concern for extension to posterior bladder  (from anterior vaginal wall lesion)   Operative Findings:   Parametrial involvement on right  Right sided subvaginal tumor burden ~3cm. This approximates the right ureteral insertion into the bladder based on my exam during cystoscopy. Extension of disease down upper half of vagina on lateral and posterior walls. Extension of disease down to lower third of anterior vagina (versus skip lesion) thus making her Stage 3A Cystoscopy revealed patent bilateral ureteral orifices and no bladder mucosa invasion or areas of concern. Rectal exam grossly no disease.     05/04/2018 Cancer Staging   Staging form: Cervix Uteri, AJCC 8th Edition - Clinical: Stage IIIB (cT3b, cN1, cM0) - Signed by [name], MD on 05/04/2018    05/12/2018 Procedure   Successful 8 French right internal jugular vein power port placement with its tip at the SVC/RA junction   05/13/2018 - 06/24/2018 Chemotherapy   The patient had weekly cisplatin given with concurrent radiation    05/17/2018 - 07/27/2018 Radiation Therapy   Radiation treatment dates:   05/17/2018-07/27/2018   Site/dose: 1. Cervix, 1.8 Gy in 25 fractions for a total dose of 45 Gy                    2. Pelvis  Boost, 1.8 Gy in 5 fractions for a total dose of 9 Gy                    3. Cervix, 5.5 Gy in 5 fractions for a total dose of 27.5 Gy   06/03/2018 Adverse Reaction   She missed  her chemo due to severe depression and was sent to the ER for suicidal ideation.  She was seen by psychiatrist.   10/27/2018 PET scan   IMPRESSION: 1. Complete metabolic response to therapy, without residual or recurrent hypermetabolic disease. 2.  Aortic Atherosclerosis (ICD10-I70.0).     11/17/2018 Imaging   Successful right IJ vein Port-A-Cath explant.   03/29/2020 PET scan   1. Hypermetabolic lesion along the right vaginal cuff is highly worrisome for recurrent cervical cancer. No evidence of distant metastatic disease. 2.  Aortic atherosclerosis (ICD10-I70.0).    04/26/2020 - 12/10/2020 Chemotherapy   The patient had CARBOplatin  for chemotherapy treatment.      Genetic Testing   Patient has genetic testing done for PD-L1. Results revealed patient has the following:  PD-L1 combined positive score (CPS): 1%   07/08/2020 Imaging   1. Unchanged post treatment appearance of the pelvis, including a soft tissue nodule in the low right hemipelvis adjacent to the vagina measuring 2.8 x 1.7 cm, previously FDG avid and consistent with malignancy. 2. No evidence of nodal or distant metastatic disease in the abdomen or pelvis. 3. Aortic Atherosclerosis (ICD10-I70.0).   10/14/2020 Imaging   1. Unchanged right eccentric soft tissue mass in the low right hemipelvis centered on the cervix and vagina, measuring 2.9 x 1.9 cm. 2. New small volume fluid within the endometrial cavity, abnormal in the postmenopausal setting and suggesting obstruction of the cervical os by soft tissue mass. 3. No evidence of lymphadenopathy or distant metastatic disease in the abdomen or pelvis.   Aortic Atherosclerosis (ICD10-I70.0).   12/24/2020 Imaging   1. Abnormal hypodense masslike appearance continuous with hypodense masslike lesion probably centrally within a right eccentric cervix (versus right paracervical mass). The endometrial portion has enlarged compared to 10/14/2020 and active malignancy is suspected. 2. No adenopathy or findings of distant spread. 3. Other imaging findings of potential clinical significance: Aortic Atherosclerosis (ICD10-I70.0). Mild cardiomegaly. Stable scarring in the left lower lobe. Sigmoid colon scattered diverticula. Tarlov cysts at the S2 level. Vertical sclerosis in the sacral ala possibly due to stress fractures or prior radiation therapy.     01/06/2021 - 06/19/2021 Chemotherapy   Patient is on Treatment Plan : UTERINE Pembrolizumab q21d      04/07/2021 Imaging   1. The previously hypermetabolic right vaginal mass currently measures 12 cubic  cm in volume, previously 11 cubic cm on 12/23/2020. 2. No new adenopathy or new findings of distant metastatic spread. 3. New wall thickening in several loops of distal ileum suspicious for inflammation/enteritis. 4. Other imaging findings of potential clinical significance: Mild cardiomegaly. Mild sigmoid colon diverticulosis. Stable bony demineralization with vertical sclerosis in the sacral ala likely from old insufficiency fracture.   07/10/2021 Imaging   1. New moderate right-sided hydroureteronephrosis with decreased perfusion to the right kidney compatible with obstructive uropathy.  2. Interval progression of the heterogeneous low-density uterine lesion, with progression of enhancing soft tissue extending inferiorly to the right, compatible with previously described disease in the right vaginal wall. Represents the site of right ureteral obstruction. 3. No evidence for new metastatic disease in the abdomen or pelvis. 4. Bilateral sacral insufficiency fractures. 5. Trace free fluid in the pelvis. 6. Aortic Atherosclerosis (ICD10-I70.0).     07/22/2021 Procedure   Successful placement of a right internal jugular approach power injectable Port-A-Cath.     07/24/2021 -  Chemotherapy   Patient is on Treatment Plan : cervical cancer Cisplatin q7d        PHYSICAL  EXAMINATION: ECOG PERFORMANCE STATUS: 1 - Symptomatic but completely ambulatory  Vitals:   09/11/21 1309  BP: 133/86  Pulse: 94  Resp: 18  Temp: 98.2 F (36.8 C)  SpO2: 96%   Filed Weights   09/11/21 1309  Weight: 91 lb 3.2 oz (41.4 kg)    GENERAL:alert, no distress and comfortable SKIN: skin color, texture, turgor are normal, no rashes or significant lesions EYES: normal, Conjunctiva are pink and non-injected, sclera clear OROPHARYNX:no exudate, no erythema and lips, buccal mucosa, and tongue normal  NECK: supple, thyroid normal size, non-tender, without nodularity LYMPH:  no palpable lymphadenopathy in the  cervical, axillary or inguinal LUNGS: clear to auscultation and percussion with normal breathing effort HEART: regular rate & rhythm and no murmurs and no lower extremity edema ABDOMEN:abdomen soft, non-tender and normal bowel sounds Musculoskeletal:no cyanosis of digits and no clubbing  NEURO: alert & oriented x 3 with fluent speech, no focal motor/sensory deficits  LABORATORY DATA:  I have reviewed the data as listed    Component Value Date/Time   NA 137 09/11/2021 1245   K 4.3 09/11/2021 1245   CL 106 09/11/2021 1245   CO2 24 09/11/2021 1245   GLUCOSE 97 09/11/2021 1245   BUN 22 09/11/2021 1245   CREATININE 1.25 (H) 09/11/2021 1245   CREATININE 1.12 (H) 06/19/2021 0725   CALCIUM 8.9 09/11/2021 1245   PROT 7.2 09/11/2021 1245   ALBUMIN 4.2 09/11/2021 1245   AST 16 09/11/2021 1245   AST 24 06/19/2021 0725   ALT 11 09/11/2021 1245   ALT 10 06/19/2021 0725   ALKPHOS 65 09/11/2021 1245   BILITOT 0.3 09/11/2021 1245   BILITOT 0.5 06/19/2021 0725   GFRNONAA 47 (L) 09/11/2021 1245   GFRNONAA 53 (L) 06/19/2021 0725   GFRAA >60 06/14/2020 0844   GFRAA >60 03/11/2020 0951    No results found for: SPEP, UPEP  Lab Results  Component Value Date   WBC 2.9 (L) 09/11/2021   NEUTROABS 1.7 09/11/2021   HGB 9.4 (L) 09/11/2021   HCT 28.0 (L) 09/11/2021   MCV 96.2 09/11/2021   PLT 200 09/11/2021      Chemistry      Component Value Date/Time   NA 137 09/11/2021 1245   K 4.3 09/11/2021 1245   CL 106 09/11/2021 1245   CO2 24 09/11/2021 1245   BUN 22 09/11/2021 1245   CREATININE 1.25 (H) 09/11/2021 1245   CREATININE 1.12 (H) 06/19/2021 0725      Component Value Date/Time   CALCIUM 8.9 09/11/2021 1245   ALKPHOS 65 09/11/2021 1245   AST 16 09/11/2021 1245   AST 24 06/19/2021 0725   ALT 11 09/11/2021 1245   ALT 10 06/19/2021 0725   BILITOT 0.3 09/11/2021 1245   BILITOT 0.5 06/19/2021 0725

## 2021-09-11 NOTE — Assessment & Plan Note (Signed)
Multifactorial, due to B12 deficiency, hypothyroidism and side effects from chemo Will proceed with chemo with similar dose reduction as before

## 2021-09-12 ENCOUNTER — Ambulatory Visit: Payer: Medicare Other | Admitting: Dietician

## 2021-09-12 ENCOUNTER — Inpatient Hospital Stay: Payer: Medicare Other

## 2021-09-12 VITALS — BP 121/74 | HR 87 | Temp 98.4°F | Resp 18

## 2021-09-12 DIAGNOSIS — Z7189 Other specified counseling: Secondary | ICD-10-CM

## 2021-09-12 DIAGNOSIS — C539 Malignant neoplasm of cervix uteri, unspecified: Secondary | ICD-10-CM

## 2021-09-12 DIAGNOSIS — Z5111 Encounter for antineoplastic chemotherapy: Secondary | ICD-10-CM | POA: Diagnosis not present

## 2021-09-12 MED ORDER — MAGNESIUM SULFATE 2 GM/50ML IV SOLN
2.0000 g | Freq: Once | INTRAVENOUS | Status: AC
  Administered 2021-09-12: 09:00:00 2 g via INTRAVENOUS
  Filled 2021-09-12: qty 50

## 2021-09-12 MED ORDER — SODIUM CHLORIDE 0.9 % IV SOLN
24.5000 mg/m2 | Freq: Once | INTRAVENOUS | Status: AC
  Administered 2021-09-12: 12:00:00 34 mg via INTRAVENOUS
  Filled 2021-09-12: qty 34

## 2021-09-12 MED ORDER — SODIUM CHLORIDE 0.9 % IV SOLN
150.0000 mg | Freq: Once | INTRAVENOUS | Status: AC
  Administered 2021-09-12: 11:00:00 150 mg via INTRAVENOUS
  Filled 2021-09-12: qty 150

## 2021-09-12 MED ORDER — SODIUM CHLORIDE 0.9 % IV SOLN
10.0000 mg | Freq: Once | INTRAVENOUS | Status: AC
  Administered 2021-09-12: 11:00:00 10 mg via INTRAVENOUS
  Filled 2021-09-12: qty 10

## 2021-09-12 MED ORDER — SODIUM CHLORIDE 0.9 % IV SOLN: Freq: Once | INTRAVENOUS | Status: AC

## 2021-09-12 MED ORDER — POTASSIUM CHLORIDE IN NACL 20-0.9 MEQ/L-% IV SOLN
Freq: Once | INTRAVENOUS | Status: AC
  Filled 2021-09-12: qty 1000

## 2021-09-12 MED ORDER — PALONOSETRON HCL INJECTION 0.25 MG/5ML
0.2500 mg | Freq: Once | INTRAVENOUS | Status: AC
  Administered 2021-09-12: 11:00:00 0.25 mg via INTRAVENOUS
  Filled 2021-09-12: qty 5

## 2021-09-12 MED ORDER — SODIUM CHLORIDE 0.9% FLUSH
10.0000 mL | INTRAVENOUS | Status: DC | PRN
  Administered 2021-09-12: 15:00:00 10 mL

## 2021-09-12 MED ORDER — HEPARIN SOD (PORK) LOCK FLUSH 100 UNIT/ML IV SOLN
500.0000 [IU] | Freq: Once | INTRAVENOUS | Status: AC | PRN
  Administered 2021-09-12: 15:00:00 500 [IU]

## 2021-09-12 NOTE — Progress Notes (Signed)
Nutrition Follow-up:  Patient receiving weekly cisplatin (dose reduced) for recurrent cervical cancer.   Met with patient during infusion. She reports trying strawberry flavored Ensure sample and liked it. She is agreeable to drinking one during infusion. Patient reports she now has some at home and drinks one about every day. Patient reports her appetite is not great for a couple days after treatment. [name] keeps snacks around for her to eat. She reports drinking a lot of water, [name] keeps her water glass filled. Patient reports having a "good dinner" the other night, recalls hamburger, mashed potatoes, mixed vegetables, cranberry sauce. Patient and her roommate often order meals from [employer]. Patient ate a hamburger with ketchup and cheese and fries yesterday. She ate part this for lunch and the rest for dinner. Patient reports eating a [place] sandwich prior to visit this morning. She is asking if there are any donuts she could have. Informed patient unfortunately there were no donuts today, she is agreeable to having peanut butter and graham cracker snack. Patient denies nausea, vomiting, diarrhea, constipation.  Medications: reviewed  Labs: 12/29 Cr 1.25  Anthropometrics: Weight 91 lb 3.2 oz today stable  12/8 - 90 lb 1.6 oz 11/25 - 92 lb 6.4 oz 11/10 - 89 lb 9 oz   NUTRITION DIAGNOSIS: Food and nutrition related knowledge deficit improving   INTERVENTION:  Encouraged high calorie, high protein foods Continue to drink at least on Boost Plus/equivalent daily - coupons provided Continued encouragement to eat snacks in between meals to promote weight gain - pt has handouts    MONITORING, EVALUATION, GOAL: weight trends, intake    NEXT VISIT: Thursday January 19 during infusion

## 2021-09-12 NOTE — Patient Instructions (Signed)
[facility]  Discharge Instructions: Thank you for choosing [facility] to provide your oncology and hematology care.   If you have a lab appointment with the Cancer Center, please go directly to the Cancer Center and check in at the registration area.   Wear comfortable clothing and clothing appropriate for easy access to any Portacath or PICC line.   We strive to give you quality time with your provider. You may need to reschedule your appointment if you arrive late (15 or more minutes).  Arriving late affects you and other patients whose appointments are after yours.  Also, if you miss three or more appointments without notifying the office, you may be dismissed from the clinic at the provider's discretion.      For prescription refill requests, have your pharmacy contact our office and allow 72 hours for refills to be completed.    Today you received the following chemotherapy and/or immunotherapy agents : Cisplatin    To help prevent nausea and vomiting after your treatment, we encourage you to take your nausea medication as directed.  BELOW ARE SYMPTOMS THAT SHOULD BE REPORTED IMMEDIATELY: *FEVER GREATER THAN 100.4 F (38 C) OR HIGHER *CHILLS OR SWEATING *NAUSEA AND VOMITING THAT IS NOT CONTROLLED WITH YOUR NAUSEA MEDICATION *UNUSUAL SHORTNESS OF BREATH *UNUSUAL BRUISING OR BLEEDING *URINARY PROBLEMS (pain or burning when urinating, or frequent urination) *BOWEL PROBLEMS (unusual diarrhea, constipation, pain near the anus) TENDERNESS IN MOUTH AND THROAT WITH OR WITHOUT PRESENCE OF ULCERS (sore throat, sores in mouth, or a toothache) UNUSUAL RASH, SWELLING OR PAIN  UNUSUAL VAGINAL DISCHARGE OR ITCHING   Items with * indicate a potential emergency and should be followed up as soon as possible or go to the Emergency Department if any problems should occur.  Please show the CHEMOTHERAPY ALERT CARD or IMMUNOTHERAPY ALERT CARD at check-in to  the Emergency Department and triage nurse.  Should you have questions after your visit or need to cancel or reschedule your appointment, please contact [facility]  Dept: [phone]  and follow the prompts.  Office hours are 8:00 a.m. to 4:30 p.m. Monday - Friday. Please note that voicemails left after 4:00 p.m. may not be returned until the following business day.  We are closed weekends and major holidays. You have access to a nurse at all times for urgent questions. Please call the main number to the clinic Dept: [phone] and follow the prompts.   For any non-urgent questions, you may also contact your provider using MyChart. We now offer e-Visits for anyone 18 and older to request care online for non-urgent symptoms. For details visit [email or web address].   Also download the MyChart app! Go to the app store, search "MyChart", open the app, select [facility], and log in with your MyChart username and password.  Due to Covid, a mask is required upon entering the hospital/clinic. If you do not have a mask, one will be given to you upon arrival. For doctor visits, patients may have 1 support person aged 18 or older with them. For treatment visits, patients cannot have anyone with them due to current Covid guidelines and our immunocompromised population.   

## 2021-10-02 ENCOUNTER — Other Ambulatory Visit: Payer: Self-pay

## 2021-10-02 ENCOUNTER — Inpatient Hospital Stay: Payer: Medicare Other | Admitting: Nutrition

## 2021-10-02 ENCOUNTER — Inpatient Hospital Stay: Payer: Medicare Other | Attending: Hematology and Oncology

## 2021-10-02 ENCOUNTER — Encounter: Payer: Self-pay | Admitting: Hematology and Oncology

## 2021-10-02 ENCOUNTER — Inpatient Hospital Stay (HOSPITAL_BASED_OUTPATIENT_CLINIC_OR_DEPARTMENT_OTHER): Payer: Medicare Other | Admitting: Hematology and Oncology

## 2021-10-02 ENCOUNTER — Inpatient Hospital Stay: Payer: Medicare Other

## 2021-10-02 VITALS — BP 126/74 | HR 86 | Temp 99.1°F | Resp 18 | Ht 62.0 in | Wt 93.0 lb

## 2021-10-02 DIAGNOSIS — D61818 Other pancytopenia: Secondary | ICD-10-CM | POA: Insufficient documentation

## 2021-10-02 DIAGNOSIS — E538 Deficiency of other specified B group vitamins: Secondary | ICD-10-CM

## 2021-10-02 DIAGNOSIS — Z923 Personal history of irradiation: Secondary | ICD-10-CM | POA: Insufficient documentation

## 2021-10-02 DIAGNOSIS — C539 Malignant neoplasm of cervix uteri, unspecified: Secondary | ICD-10-CM

## 2021-10-02 DIAGNOSIS — R64 Cachexia: Secondary | ICD-10-CM | POA: Diagnosis not present

## 2021-10-02 DIAGNOSIS — E039 Hypothyroidism, unspecified: Secondary | ICD-10-CM | POA: Insufficient documentation

## 2021-10-02 DIAGNOSIS — Z5111 Encounter for antineoplastic chemotherapy: Secondary | ICD-10-CM | POA: Diagnosis present

## 2021-10-02 DIAGNOSIS — Z7189 Other specified counseling: Secondary | ICD-10-CM

## 2021-10-02 DIAGNOSIS — Z79899 Other long term (current) drug therapy: Secondary | ICD-10-CM | POA: Diagnosis not present

## 2021-10-02 LAB — CBC WITH DIFFERENTIAL (CANCER CENTER ONLY)
Abs Immature Granulocytes: 0.03 10*3/uL (ref 0.00–0.07)
Basophils Absolute: 0 10*3/uL (ref 0.0–0.1)
Basophils Relative: 1 %
Eosinophils Absolute: 0 10*3/uL (ref 0.0–0.5)
Eosinophils Relative: 1 %
HCT: 29.3 % — ABNORMAL LOW (ref 36.0–46.0)
Hemoglobin: 9.6 g/dL — ABNORMAL LOW (ref 12.0–15.0)
Immature Granulocytes: 1 %
Lymphocytes Relative: 17 %
Lymphs Abs: 0.7 10*3/uL (ref 0.7–4.0)
MCH: 32.2 pg (ref 26.0–34.0)
MCHC: 32.8 g/dL (ref 30.0–36.0)
MCV: 98.3 fL (ref 80.0–100.0)
Monocytes Absolute: 0.6 10*3/uL (ref 0.1–1.0)
Monocytes Relative: 15 %
Neutro Abs: 2.7 10*3/uL (ref 1.7–7.7)
Neutrophils Relative %: 65 %
Platelet Count: 151 10*3/uL (ref 150–400)
RBC: 2.98 MIL/uL — ABNORMAL LOW (ref 3.87–5.11)
RDW: 15.5 % (ref 11.5–15.5)
WBC Count: 4.1 10*3/uL (ref 4.0–10.5)
nRBC: 0 % (ref 0.0–0.2)

## 2021-10-02 LAB — TSH: TSH: 2.616 u[IU]/mL (ref 0.308–3.960)

## 2021-10-02 LAB — BASIC METABOLIC PANEL - CANCER CENTER ONLY
Anion gap: 8 (ref 5–15)
BUN: 22 mg/dL (ref 8–23)
CO2: 23 mmol/L (ref 22–32)
Calcium: 9 mg/dL (ref 8.9–10.3)
Chloride: 108 mmol/L (ref 98–111)
Creatinine: 1.23 mg/dL — ABNORMAL HIGH (ref 0.44–1.00)
GFR, Estimated: 48 mL/min — ABNORMAL LOW (ref >60)
Glucose, Bld: 90 mg/dL (ref 70–99)
Potassium: 4.1 mmol/L (ref 3.5–5.1)
Sodium: 139 mmol/L (ref 135–145)

## 2021-10-02 LAB — MAGNESIUM: Magnesium: 1.8 mg/dL (ref 1.7–2.4)

## 2021-10-02 MED ORDER — SODIUM CHLORIDE 0.9 % IV SOLN
24.5000 mg/m2 | Freq: Once | INTRAVENOUS | Status: AC
  Administered 2021-10-02: 34 mg via INTRAVENOUS
  Filled 2021-10-02: qty 34

## 2021-10-02 MED ORDER — MAGNESIUM SULFATE 2 GM/50ML IV SOLN
2.0000 g | Freq: Once | INTRAVENOUS | Status: AC
  Administered 2021-10-02: 2 g via INTRAVENOUS
  Filled 2021-10-02: qty 50

## 2021-10-02 MED ORDER — HEPARIN SOD (PORK) LOCK FLUSH 100 UNIT/ML IV SOLN
500.0000 [IU] | Freq: Once | INTRAVENOUS | Status: AC | PRN
  Administered 2021-10-02: 500 [IU]

## 2021-10-02 MED ORDER — SODIUM CHLORIDE 0.9 % IV SOLN: Freq: Once | INTRAVENOUS | Status: AC

## 2021-10-02 MED ORDER — CYANOCOBALAMIN 1000 MCG/ML IJ SOLN
1000.0000 ug | Freq: Once | INTRAMUSCULAR | Status: AC
  Administered 2021-10-02: 1000 ug via INTRAMUSCULAR
  Filled 2021-10-02: qty 1

## 2021-10-02 MED ORDER — SODIUM CHLORIDE 0.9 % IV SOLN
10.0000 mg | Freq: Once | INTRAVENOUS | Status: AC
  Administered 2021-10-02: 10 mg via INTRAVENOUS
  Filled 2021-10-02: qty 10

## 2021-10-02 MED ORDER — PALONOSETRON HCL INJECTION 0.25 MG/5ML
0.2500 mg | Freq: Once | INTRAVENOUS | Status: AC
  Administered 2021-10-02: 0.25 mg via INTRAVENOUS
  Filled 2021-10-02: qty 5

## 2021-10-02 MED ORDER — SODIUM CHLORIDE 0.9% FLUSH
10.0000 mL | INTRAVENOUS | Status: DC | PRN
  Administered 2021-10-02: 10 mL

## 2021-10-02 MED ORDER — SODIUM CHLORIDE 0.9 % IV SOLN
150.0000 mg | Freq: Once | INTRAVENOUS | Status: AC
  Administered 2021-10-02: 150 mg via INTRAVENOUS
  Filled 2021-10-02: qty 150

## 2021-10-02 MED ORDER — POTASSIUM CHLORIDE IN NACL 20-0.9 MEQ/L-% IV SOLN
Freq: Once | INTRAVENOUS | Status: AC
  Filled 2021-10-02: qty 1000

## 2021-10-02 NOTE — Progress Notes (Signed)
Erin Kent °OFFICE PROGRESS NOTE ° °Patient Care Team: °Default, Provider, MD as PCP - General ° °ASSESSMENT & PLAN:  °Malignant neoplasm of cervix (HCC) °Overall, she tolerated treatment very poorly with significant pancytopenia requiring multiple dose adjustment and treatment delay °After today's treatment, I plan to order imaging study for objective assessment of response to therapy ° °Vitamin B12 deficiency °She had history of B12 deficiency °She will continue monthly B12 injection ° °Pancytopenia, acquired (HCC) °Multifactorial, due to B12 deficiency, hypothyroidism and side effects from chemo °Will proceed with chemo with similar dose reduction as before ° °Cachexia (HCC) °She has not lost weight since last time I saw her °She will continue close monitoring and follow-up with dietitian ° °Orders Placed This Encounter  °Procedures  ° CT ABDOMEN PELVIS W CONTRAST  °  Standing Status:   Future  °  Standing Expiration Date:   10/02/2022  °  Order Specific Question:   If indicated for the ordered procedure, I authorize the administration of contrast media per Radiology protocol  °  Answer:   Yes  °  Order Specific Question:   Preferred imaging location?  °  Answer:   [facility]  °  Order Specific Question:   Radiology Contrast Protocol - do NOT remove file path  °  Answer:   [email or web address]  ° ° °All questions were answered. The patient knows to call the clinic with any problems, questions or concerns. °The total time spent in the appointment was 30 minutes encounter with patients including review of chart and various tests results, discussions about plan of care and coordination of care plan °  ° , MD °10/02/2021 5:05 PM ° °INTERVAL HISTORY: °Please see below for problem oriented charting. °she returns for treatment follow-up on single agent cisplatin °She tolerated last treatment well °Denies changes in her hearing, peripheral neuropathy,  nausea or infection °No pelvic pain or discharge ° °REVIEW OF SYSTEMS:   °Constitutional: Denies fevers, chills or abnormal weight loss °Eyes: Denies blurriness of vision °Ears, nose, mouth, throat, and face: Denies mucositis or sore throat °Respiratory: Denies cough, dyspnea or wheezes °Cardiovascular: Denies palpitation, chest discomfort or lower extremity swelling °Gastrointestinal:  Denies nausea, heartburn or change in bowel habits °Skin: Denies abnormal skin rashes °Lymphatics: Denies new lymphadenopathy or easy bruising °Neurological:Denies numbness, tingling or new weaknesses °Behavioral/Psych: Mood is stable, no new changes  °All other systems were reviewed with the patient and are negative. ° °I have reviewed the past medical history, past surgical history, social history and family history with the patient and they are unchanged from previous note. ° °ALLERGIES:  has No Known Allergies. ° °MEDICATIONS:  °Current Outpatient Medications  °Medication Sig Dispense Refill  ° levothyroxine (SYNTHROID) 100 MCG tablet Take 1 tablet (100 mcg total) by mouth daily before breakfast. 30 tablet 1  ° lidocaine-prilocaine (EMLA) cream Apply to affected area once 30 g 3  ° Multiple Vitamin (MULTIVITAMIN) tablet Take 1 tablet by mouth daily.    ° ondansetron (ZOFRAN) 8 MG tablet Take 1 tablet (8 mg total) by mouth every 8 (eight) hours as needed. Start on the third day after cisplatin chemotherapy. 30 tablet 1  ° prochlorperazine (COMPAZINE) 10 MG tablet Take 1 tablet (10 mg total) by mouth every 6 (six) hours as needed (Nausea or vomiting). 30 tablet 1  ° °No current facility-administered medications for this visit.  ° °Facility-Administered Medications Ordered in Other Visits  °Medication Dose Route Frequency Provider Last Rate Last Admin  °   sodium chloride flush (NS) 0.9 % injection 10 mL  10 mL Intracatheter PRN Alvy Bimler, Shaquanda Graves, MD   10 mL at 10/02/21 1502    SUMMARY OF ONCOLOGIC HISTORY: Oncology History Overview  Note  Cancer Staging Malignant neoplasm of cervix ([place]) Staging form: Cervix Uteri, AJCC 8th Edition - Clinical: Stage IIIB (cT3b, cN1, cM0) - Signed by [name], MD on 05/04/2018  PD-L1 1% Progressed on carboplatin and pembrolizumab   Malignant neoplasm of cervix ([place])  03/13/2018 Imaging   US pelvis There are mobile echogenic foci within the endometrial cavity suggestive of gas. This is nonspecific in etiology however may be secondary to endometritis. Correlate for history of recent instrumentation. This obscures visualization of the endometrial tissue and therefore a follow-up pelvic ultrasound in 2-4 weeks is recommended after resolution of the acute symptomatology if symptoms persist to further evaluate the endometrium.     03/13/2018 Initial Diagnosis   She initially presented with PMB   04/12/2018 Pathology Results   Cervix, biopsy, mass - INVASIVE SQUAMOUS CELL CARCINOMA - SEE COMMENT   04/12/2018 Surgery   PREOPERATIVE DIAGNOSIS:  Postmenopausal vaginal bleeding. POSTOPERATIVE DIAGNOSIS: The same PROCEDURE: Exam under anesthesia, pap smear, cervical mass biopsy SURGEON:  Dr. [name]   INDICATIONS: 71 y.o. yo [id number] with PMB here for exam under anesthesia.Risks of surgery were discussed with the patient including but not limited to: bleeding which may require transfusion; infection which may require antibiotics; injury to uterus or surrounding organs; need for additional procedures including laparotomy or laparoscopy; and other postoperative/anesthesia complications. Written informed consent was obtained.     FINDINGS:  An 8-week size midline uterus.  No adnexal mass palpable on exam. Normal cervix not visualized. Friable mass seen in involving the vagina at the level of the cervix obliterating the anterior and posterior fornix.   ANESTHESIA:   General INTRAVENOUS FLUIDS:  300 ml of LR ESTIMATED BLOOD LOSS: 20 ml. SPECIMENS: pap smear, cervical mass  biopsy COMPLICATIONS:  None immediate.     04/25/2018 PET scan   1. Large cervical mass may invade into the myometrium in down into the vagina, maximum SUV 21.3. There is a malignant left external iliac node measuring 1.4 cm in short axis with maximum SUV 14.1. 2. Accentuated activity in the cecum and ascending colon is likely physiologic given that it has no CT correlate. Correlation with the patient's colon cancer screening history is recommended. If screening is not up-to-date, appropriate screening should be considered. 3.  Aortic Atherosclerosis (ICD10-I70.0).     04/28/2018 Surgery   Pre-operative Diagnosis:  At least Stage 2B SCCa Cervical Cancer   Post-operative Diagnosis: At least clinical Stage 3A SCCa Cervical Cancer   Operation:  Exam under anesthesia due to intolerance for vaginal exam in office Cystoscopy due to concern for extension to posterior bladder (from anterior vaginal wall lesion)   Operative Findings:   Parametrial involvement on right  Right sided subvaginal tumor burden ~3cm. This approximates the right ureteral insertion into the bladder based on my exam during cystoscopy. Extension of disease down upper half of vagina on lateral and posterior walls. Extension of disease down to lower third of anterior vagina (versus skip lesion) thus making her Stage 3A Cystoscopy revealed patent bilateral ureteral orifices and no bladder mucosa invasion or areas of concern. Rectal exam grossly no disease.     05/04/2018 Cancer Staging   Staging form: Cervix Uteri, AJCC 8th Edition - Clinical: Stage IIIB (cT3b, cN1, cM0) - Signed by [name], MD on  05/04/2018 ° °  °05/12/2018 Procedure  ° Successful 8 French right internal jugular vein power port placement with its tip at the SVC/RA junction °  °05/13/2018 - 06/24/2018 Chemotherapy  ° The patient had weekly cisplatin given with concurrent radiation ° °  °05/17/2018 - 07/27/2018 Radiation Therapy  ° Radiation treatment dates:    05/17/2018-07/27/2018 °  °Site/dose: 1. Cervix, 1.8 Gy in 25 fractions for a total dose of 45 Gy °                   2. Pelvis  Boost, 1.8 Gy in 5 fractions for a total dose of 9 Gy °                   3. Cervix, 5.5 Gy in 5 fractions for a total dose of 27.5 Gy °  °06/03/2018 Adverse Reaction  ° She missed her chemo due to severe depression and was sent to the ER for suicidal ideation.  She was seen by psychiatrist. °  °10/27/2018 PET scan  ° IMPRESSION: °1. Complete metabolic response to therapy, without residual or recurrent hypermetabolic disease. °2.  Aortic Atherosclerosis (ICD10-I70.0). °  °  °11/17/2018 Imaging  ° Successful right IJ vein Port-A-Cath explant. °  °03/29/2020 PET scan  ° 1. Hypermetabolic lesion along the right vaginal cuff is highly worrisome for recurrent cervical cancer. No evidence of distant metastatic disease. °2.  Aortic atherosclerosis (ICD10-I70.0). °  °04/26/2020 - 12/10/2020 Chemotherapy  ° The patient had CARBOplatin  for chemotherapy treatment.  ° °  ° Genetic Testing  ° Patient has genetic testing done for PD-L1. °Results revealed patient has the following: ° °PD-L1 combined positive score (CPS): 1% °  °07/08/2020 Imaging  ° 1. Unchanged post treatment appearance of the pelvis, including a soft tissue nodule in the low right hemipelvis adjacent to the vagina measuring 2.8 x 1.7 cm, previously FDG avid and consistent with malignancy. °2. No evidence of nodal or distant metastatic disease in the abdomen or pelvis. °3. Aortic Atherosclerosis (ICD10-I70.0). °  °10/14/2020 Imaging  ° 1. Unchanged right eccentric soft tissue mass in the low right hemipelvis centered on the cervix and vagina, measuring 2.9 x 1.9 cm. °2. New small volume fluid within the endometrial cavity, abnormal in the postmenopausal setting and suggesting obstruction of the cervical os by soft tissue mass. °3. No evidence of lymphadenopathy or distant metastatic disease in the abdomen or pelvis. °  °Aortic Atherosclerosis  (ICD10-I70.0). °  °12/24/2020 Imaging  ° 1. Abnormal hypodense masslike appearance continuous with hypodense masslike lesion probably centrally within a right eccentric cervix (versus right paracervical mass). The endometrial portion has enlarged compared to 10/14/2020 and active malignancy is suspected. °2. No adenopathy or findings of distant spread. °3. Other imaging findings of potential clinical significance: Aortic Atherosclerosis (ICD10-I70.0). Mild cardiomegaly. Stable scarring in the left lower lobe. Sigmoid colon scattered diverticula. Tarlov cysts at the S2 level. Vertical sclerosis in the sacral ala possibly due to stress fractures or prior radiation therapy. °  °  °01/06/2021 - 06/19/2021 Chemotherapy  ° Patient is on Treatment Plan : UTERINE Pembrolizumab q21d  °  °  °04/07/2021 Imaging  ° 1. The previously hypermetabolic right vaginal mass currently measures 12 cubic cm in volume, previously 11 cubic cm on 12/23/2020. °2. No new adenopathy or new findings of distant metastatic spread. °3. New wall thickening in several loops of distal ileum suspicious for inflammation/enteritis. °4. Other imaging findings of potential clinical significance: Mild cardiomegaly. Mild sigmoid   colon diverticulosis. Stable bony demineralization with vertical sclerosis in the sacral ala likely from old insufficiency fracture.   07/10/2021 Imaging   1. New moderate right-sided hydroureteronephrosis with decreased perfusion to the right kidney compatible with obstructive uropathy.  2. Interval progression of the heterogeneous low-density uterine lesion, with progression of enhancing soft tissue extending inferiorly to the right, compatible with previously described disease in the right vaginal wall. Represents the site of right ureteral obstruction. 3. No evidence for new metastatic disease in the abdomen or pelvis. 4. Bilateral sacral insufficiency fractures. 5. Trace free fluid in the pelvis. 6. Aortic Atherosclerosis  (ICD10-I70.0).     07/22/2021 Procedure   Successful placement of a right internal jugular approach power injectable Port-A-Cath.     07/24/2021 -  Chemotherapy   Patient is on Treatment Plan : cervical cancer Cisplatin q7d        PHYSICAL EXAMINATION: ECOG PERFORMANCE STATUS: 0 - Asymptomatic  Vitals:   10/02/21 0803  BP: 126/74  Pulse: 86  Resp: 18  Temp: 99.1 F (37.3 C)  SpO2: 96%   Filed Weights   10/02/21 0803  Weight: 93 lb (42.2 kg)    GENERAL:alert, no distress and comfortable SKIN: skin color, texture, turgor are normal, no rashes or significant lesions EYES: normal, Conjunctiva are pink and non-injected, sclera clear OROPHARYNX:no exudate, no erythema and lips, buccal mucosa, and tongue normal  NECK: supple, thyroid normal size, non-tender, without nodularity LYMPH:  no palpable lymphadenopathy in the cervical, axillary or inguinal LUNGS: clear to auscultation and percussion with normal breathing effort HEART: regular rate & rhythm and no murmurs and no lower extremity edema ABDOMEN:abdomen soft, non-tender and normal bowel sounds Musculoskeletal:no cyanosis of digits and no clubbing  NEURO: alert & oriented x 3 with fluent speech, no focal motor/sensory deficits  LABORATORY DATA:  I have reviewed the data as listed    Component Value Date/Time   NA 139 10/02/2021 0737   K 4.1 10/02/2021 0737   CL 108 10/02/2021 0737   CO2 23 10/02/2021 0737   GLUCOSE 90 10/02/2021 0737   BUN 22 10/02/2021 0737   CREATININE 1.23 (H) 10/02/2021 0737   CALCIUM 9.0 10/02/2021 0737   PROT 7.2 09/11/2021 1245   ALBUMIN 4.2 09/11/2021 1245   AST 16 09/11/2021 1245   AST 24 06/19/2021 0725   ALT 11 09/11/2021 1245   ALT 10 06/19/2021 0725   ALKPHOS 65 09/11/2021 1245   BILITOT 0.3 09/11/2021 1245   BILITOT 0.5 06/19/2021 0725   GFRNONAA 48 (L) 10/02/2021 0737   GFRAA >60 06/14/2020 0844   GFRAA >60 03/11/2020 0951    No results found for: SPEP, UPEP  Lab Results   Component Value Date   WBC 4.1 10/02/2021   NEUTROABS 2.7 10/02/2021   HGB 9.6 (L) 10/02/2021   HCT 29.3 (L) 10/02/2021   MCV 98.3 10/02/2021   PLT 151 10/02/2021      Chemistry      Component Value Date/Time   NA 139 10/02/2021 0737   K 4.1 10/02/2021 0737   CL 108 10/02/2021 0737   CO2 23 10/02/2021 0737   BUN 22 10/02/2021 0737   CREATININE 1.23 (H) 10/02/2021 0737      Component Value Date/Time   CALCIUM 9.0 10/02/2021 0737   ALKPHOS 65 09/11/2021 1245   AST 16 09/11/2021 1245   AST 24 06/19/2021 0725   ALT 11 09/11/2021 1245   ALT 10 06/19/2021 0725   BILITOT 0.3 09/11/2021 1245  BILITOT 0.5 06/19/2021 0725

## 2021-10-02 NOTE — Assessment & Plan Note (Signed)
Overall, she tolerated treatment very poorly with significant pancytopenia requiring multiple dose adjustment and treatment delay After today's treatment, I plan to order imaging study for objective assessment of response to therapy

## 2021-10-02 NOTE — Assessment & Plan Note (Signed)
She has not lost weight since last time I saw her She will continue close monitoring and follow-up with dietitian

## 2021-10-02 NOTE — Assessment & Plan Note (Signed)
She had history of B12 deficiency She will continue monthly B12 injection

## 2021-10-02 NOTE — Patient Instructions (Signed)
[facility] MEDICAL [facility]COLOG[facility]  Discharge Instructions: Thank you for choosing [facility] to provide your oncology and hematology care.   If you have a lab appointment with the Cancer Center, please go directly to the Cancer Center and check in at the registration area.   Wear comfortable clothing and clothing appropriate for easy access to any Portacath or PICC line.   We strive to give you quality time with your provider. You may need to reschedule your appointment if you arrive late (15 or more minutes).  Arriving late affects you and other patients whose appointments are after yours.  Also, if you miss three or more appointments without notifying the office, you may be dismissed from the clinic at the provider's discretion.      For prescription refill requests, have your pharmacy contact our office and allow 72 hours for refills to be completed.    Today you received the following chemotherapy and/or immunotherapy agents : Cisplatin    To help prevent nausea and vomiting after your treatment, we encourage you to take your nausea medication as directed.  BELOW ARE SYMPTOMS THAT SHOULD BE REPORTED IMMEDIATELY: *FEVER GREATER THAN 100.4 F (38 C) OR HIGHER *CHILLS OR SWEATING *NAUSEA AND VOMITING THAT IS NOT CONTROLLED WITH YOUR NAUSEA MEDICATION *UNUSUAL SHORTNESS OF BREATH *UNUSUAL BRUISING OR BLEEDING *URINARY PROBLEMS (pain or burning when urinating, or frequent urination) *BOWEL PROBLEMS (unusual diarrhea, constipation, pain near the anus) TENDERNESS IN MOUTH AND THROAT WITH OR WITHOUT PRESENCE OF ULCERS (sore throat, sores in mouth, or a toothache) UNUSUAL RASH, SWELLING OR PAIN  UNUSUAL VAGINAL DISCHARGE OR ITCHING   Items with * indicate a potential emergency and should be followed up as soon as possible or go to the Emergency Department if any problems should occur.  Please show the CHEMOTHERAPY ALERT CARD or IMMUNOTHERAPY ALERT CARD at check-in to  the Emergency Department and triage nurse.  Should you have questions after your visit or need to cancel or reschedule your appointment, please contact [facility]  Dept: [phone]  and follow the prompts.  Office hours are 8:00 a.m. to 4:30 p.m. Monday - Friday. Please note that voicemails left after 4:00 p.m. may not be returned until the following business day.  We are closed weekends and major holidays. You have access to a nurse at all times for urgent questions. Please call the main number to the clinic Dept: [phone] and follow the prompts.   For any non-urgent questions, you may also contact your provider using MyChart. We now offer e-Visits for anyone 18 and older to request care online for non-urgent symptoms. For details visit [email or web address].   Also download the MyChart app! Go to the app store, search "MyChart", open the app, select [facility], and log in with your MyChart username and password.  Due to Covid, a mask is required upon entering the hospital/clinic. If you do not have a mask, one will be given to you upon arrival. For doctor visits, patients may have 1 support person aged 18 or older with them. For treatment visits, patients cannot have anyone with them due to current Covid guidelines and our immunocompromised population.   

## 2021-10-02 NOTE — Assessment & Plan Note (Signed)
Multifactorial, due to B12 deficiency, hypothyroidism and side effects from chemo Will proceed with chemo with similar dose reduction as before

## 2021-10-02 NOTE — Progress Notes (Signed)
Nutrition follow-up completed with patient during treatment for recurrent cervical cancer.  Weight has improved and was documented as 93 pounds January 19, increased from 91.2 pounds December 29. Patient is drinking 1 boost plus every day.  She likes the [employer].  She thinks she can continue consuming this without difficulty. She reports eating well for the most part. She denies nausea, vomiting, constipation, and diarrhea.  Nutrition diagnosis: Food and nutrition related knowledge deficit improved.  Intervention: Educated patient to continue strategies of smaller more frequent meals and snacks. Continue to drink at least 1 boost plus or equivalent daily. Provided coupons.  Monitoring, evaluation, goals: Patient will tolerate adequate calories and protein for weight maintenance/weight gain.  Next visit: RD will follow as needed.  **Disclaimer: This note was dictated with voice recognition software. Similar sounding words can inadvertently be transcribed and this note may contain transcription errors which may not have been corrected upon publication of note.**

## 2021-10-10 ENCOUNTER — Ambulatory Visit (HOSPITAL_COMMUNITY)
Admission: RE | Admit: 2021-10-10 | Discharge: 2021-10-10 | Disposition: A | Discharge status: Home / Self Care | Payer: Medicare Other | Source: Ambulatory Visit | Attending: Hematology and Oncology | Admitting: Hematology and Oncology

## 2021-10-10 ENCOUNTER — Encounter (HOSPITAL_COMMUNITY): Payer: Self-pay

## 2021-10-10 ENCOUNTER — Other Ambulatory Visit: Payer: Self-pay

## 2021-10-10 DIAGNOSIS — C539 Malignant neoplasm of cervix uteri, unspecified: Secondary | ICD-10-CM

## 2021-10-10 MED ORDER — IOHEXOL 300 MG/ML  SOLN: 65.0000 mL | Freq: Once | INTRAMUSCULAR | Status: DC | PRN

## 2021-10-14 ENCOUNTER — Telehealth: Payer: Self-pay

## 2021-10-14 ENCOUNTER — Inpatient Hospital Stay: Payer: Medicare Other | Admitting: Hematology and Oncology

## 2021-10-14 NOTE — Telephone Encounter (Signed)
Called and given appt for 2/3 at 1240 with Dr. Alvy Bimler. Given appt to [facility]. She verbalized understanding.

## 2021-10-15 ENCOUNTER — Other Ambulatory Visit: Payer: Self-pay

## 2021-10-15 ENCOUNTER — Ambulatory Visit (HOSPITAL_COMMUNITY)
Admission: RE | Admit: 2021-10-15 | Discharge: 2021-10-15 | Disposition: A | Discharge status: Home / Self Care | Payer: Medicare Other | Source: Ambulatory Visit | Attending: Hematology and Oncology | Admitting: Hematology and Oncology

## 2021-10-15 DIAGNOSIS — C539 Malignant neoplasm of cervix uteri, unspecified: Secondary | ICD-10-CM | POA: Diagnosis not present

## 2021-10-15 MED ORDER — IOHEXOL 300 MG/ML  SOLN
100.0000 mL | Freq: Once | INTRAMUSCULAR | Status: AC | PRN
  Administered 2021-10-15: 75 mL via INTRAVENOUS

## 2021-10-17 ENCOUNTER — Inpatient Hospital Stay: Payer: Medicare Other | Attending: Hematology and Oncology | Admitting: Hematology and Oncology

## 2021-10-17 ENCOUNTER — Other Ambulatory Visit: Payer: Self-pay | Admitting: Hematology and Oncology

## 2021-10-17 ENCOUNTER — Other Ambulatory Visit: Payer: Self-pay

## 2021-10-17 ENCOUNTER — Encounter: Payer: Self-pay | Admitting: Hematology and Oncology

## 2021-10-17 DIAGNOSIS — E039 Hypothyroidism, unspecified: Secondary | ICD-10-CM

## 2021-10-17 DIAGNOSIS — D61818 Other pancytopenia: Secondary | ICD-10-CM | POA: Diagnosis not present

## 2021-10-17 DIAGNOSIS — Z9221 Personal history of antineoplastic chemotherapy: Secondary | ICD-10-CM | POA: Diagnosis not present

## 2021-10-17 DIAGNOSIS — Z923 Personal history of irradiation: Secondary | ICD-10-CM | POA: Insufficient documentation

## 2021-10-17 DIAGNOSIS — Z7189 Other specified counseling: Secondary | ICD-10-CM

## 2021-10-17 DIAGNOSIS — N1330 Unspecified hydronephrosis: Secondary | ICD-10-CM | POA: Diagnosis not present

## 2021-10-17 DIAGNOSIS — K5730 Diverticulosis of large intestine without perforation or abscess without bleeding: Secondary | ICD-10-CM | POA: Diagnosis not present

## 2021-10-17 DIAGNOSIS — C539 Malignant neoplasm of cervix uteri, unspecified: Secondary | ICD-10-CM | POA: Diagnosis not present

## 2021-10-17 DIAGNOSIS — I700 Atherosclerosis of aorta: Secondary | ICD-10-CM | POA: Diagnosis not present

## 2021-10-17 DIAGNOSIS — Z79899 Other long term (current) drug therapy: Secondary | ICD-10-CM | POA: Insufficient documentation

## 2021-10-17 DIAGNOSIS — E538 Deficiency of other specified B group vitamins: Secondary | ICD-10-CM | POA: Diagnosis not present

## 2021-10-17 DIAGNOSIS — Z5111 Encounter for antineoplastic chemotherapy: Secondary | ICD-10-CM | POA: Insufficient documentation

## 2021-10-17 MED ORDER — LEVOTHYROXINE SODIUM 100 MCG PO TABS: 100.0000 ug | ORAL_TABLET | Freq: Every day | ORAL | 0 refills | Status: DC

## 2021-10-17 NOTE — Assessment & Plan Note (Signed)
Briefly, I discussed goals of care The patient is made aware of her overall poor prognosis She will contact me next week after she has made final decision about the next step

## 2021-10-17 NOTE — Assessment & Plan Note (Signed)
Her recent TSH is better controlled I refilled her prescription today We will monitor her thyroid function carefully

## 2021-10-17 NOTE — Assessment & Plan Note (Signed)
Unfortunately, her hydronephrosis is worse, likely compromising her right kidney function She does not need stent placement right now

## 2021-10-17 NOTE — Assessment & Plan Note (Signed)
She had received aggressive B12 replacement therapy She will continue B12 monthly

## 2021-10-17 NOTE — Progress Notes (Signed)
[facility] OFFICE PROGRESS NOTE  Patient Care Team: Default, Provider, MD as PCP - General  ASSESSMENT & PLAN:  Malignant neoplasm of cervix ([place]) I have reviewed imaging study with the patient Unfortunately, she has disease progression The patient has progressed on carboplatin, pembrolizumab as well as cisplatin The patient is not able to tell me whether she wants further palliative chemotherapy Her overall prognosis is poor due to progression of disease Her estimated any further treatment will give her very low benefit, likely under 20% or so If she desire further treatment, single agent Taxol, Taxotere or gemcitabine can be given but that would be at the expense of significant risk and side effects Ultimately, she is undecided She will call me next week after she made final decision about the next step  Hydronephrosis of right kidney Unfortunately, her hydronephrosis is worse, likely compromising her right kidney function She does not need stent placement right now  Acquired hypothyroidism Her recent TSH is better controlled I refilled her prescription today We will monitor her thyroid function carefully  Vitamin B12 deficiency She had received aggressive B12 replacement therapy She will continue B12 monthly  Goals of care, counseling/discussion Briefly, I discussed goals of care The patient is made aware of her overall poor prognosis She will contact me next week after she has made final decision about the next step  Orders Placed This Encounter  Procedures   Vitamin B12    Standing Status:   Future    Standing Expiration Date:   10/17/2022   CMP ([place] only)    Standing Status:   Future    Standing Expiration Date:   10/17/2022    All questions were answered. The patient knows to call the clinic with any problems, questions or concerns. The total time spent in the appointment was 40 minutes encounter with patients including review of chart and  various tests results, discussions about plan of care and coordination of care plan   [name], MD 10/17/2021 1:09 PM  INTERVAL HISTORY: Please see below for problem oriented charting. she returns for follow-up and review of recent imaging results She denies recent side effects from treatment No pelvic pain no vaginal bleeding  REVIEW OF SYSTEMS:   Constitutional: Denies fevers, chills or abnormal weight loss Eyes: Denies blurriness of vision Ears, nose, mouth, throat, and face: Denies mucositis or sore throat Respiratory: Denies cough, dyspnea or wheezes Cardiovascular: Denies palpitation, chest discomfort or lower extremity swelling Gastrointestinal:  Denies nausea, heartburn or change in bowel habits Skin: Denies abnormal skin rashes Lymphatics: Denies new lymphadenopathy or easy bruising Neurological:Denies numbness, tingling or new weaknesses Behavioral/Psych: Mood is stable, no new changes  All other systems were reviewed with the patient and are negative.  I have reviewed the past medical history, past surgical history, social history and family history with the patient and they are unchanged from previous note.  ALLERGIES:  has No Known Allergies.  MEDICATIONS:  Current Outpatient Medications  Medication Sig Dispense Refill   levothyroxine (SYNTHROID) 100 MCG tablet Take 1 tablet (100 mcg total) by mouth daily before breakfast. 30 tablet 0   lidocaine-prilocaine (EMLA) cream Apply to affected area once 30 g 3   Multiple Vitamin (MULTIVITAMIN) tablet Take 1 tablet by mouth daily.     ondansetron (ZOFRAN) 8 MG tablet Take 1 tablet (8 mg total) by mouth every 8 (eight) hours as needed. Start on the third day after cisplatin chemotherapy. 30 tablet 1   prochlorperazine (COMPAZINE)  10 MG tablet Take 1 tablet (10 mg total) by mouth every 6 (six) hours as needed (Nausea or vomiting). 30 tablet 1   No current facility-administered medications for this visit.    SUMMARY OF  ONCOLOGIC HISTORY: Oncology History Overview Note  Cancer Staging Malignant neoplasm of cervix ([place]) Staging form: Cervix Uteri, AJCC 8th Edition - Clinical: Stage IIIB (cT3b, cN1, cM0) - Signed by [name], MD on 05/04/2018  PD-L1 1% Progressed on carboplatin, pembrolizumab and cisplatin   Malignant neoplasm of cervix ([place])  03/13/2018 Imaging   US pelvis There are mobile echogenic foci within the endometrial cavity suggestive of gas. This is nonspecific in etiology however may be secondary to endometritis. Correlate for history of recent instrumentation. This obscures visualization of the endometrial tissue and therefore a follow-up pelvic ultrasound in 2-4 weeks is recommended after resolution of the acute symptomatology if symptoms persist to further evaluate the endometrium.     03/13/2018 Initial Diagnosis   She initially presented with PMB   04/12/2018 Pathology Results   Cervix, biopsy, mass - INVASIVE SQUAMOUS CELL CARCINOMA - SEE COMMENT   04/12/2018 Surgery   PREOPERATIVE DIAGNOSIS:  Postmenopausal vaginal bleeding. POSTOPERATIVE DIAGNOSIS: The same PROCEDURE: Exam under anesthesia, pap smear, cervical mass biopsy SURGEON:  Dr. [name]   INDICATIONS: 71 y.o. yo [id number] with PMB here for exam under anesthesia.Risks of surgery were discussed with the patient including but not limited to: bleeding which may require transfusion; infection which may require antibiotics; injury to uterus or surrounding organs; need for additional procedures including laparotomy or laparoscopy; and other postoperative/anesthesia complications. Written informed consent was obtained.     FINDINGS:  An 8-week size midline uterus.  No adnexal mass palpable on exam. Normal cervix not visualized. Friable mass seen in involving the vagina at the level of the cervix obliterating the anterior and posterior fornix.   ANESTHESIA:   General INTRAVENOUS FLUIDS:  300 ml of LR ESTIMATED BLOOD LOSS: 20  ml. SPECIMENS: pap smear, cervical mass biopsy COMPLICATIONS:  None immediate.     04/25/2018 PET scan   1. Large cervical mass may invade into the myometrium in down into the vagina, maximum SUV 21.3. There is a malignant left external iliac node measuring 1.4 cm in short axis with maximum SUV 14.1. 2. Accentuated activity in the cecum and ascending colon is likely physiologic given that it has no CT correlate. Correlation with the patient's colon cancer screening history is recommended. If screening is not up-to-date, appropriate screening should be considered. 3.  Aortic Atherosclerosis (ICD10-I70.0).     04/28/2018 Surgery   Pre-operative Diagnosis:  At least Stage 2B SCCa Cervical Cancer   Post-operative Diagnosis: At least clinical Stage 3A SCCa Cervical Cancer   Operation:  Exam under anesthesia due to intolerance for vaginal exam in office Cystoscopy due to concern for extension to posterior bladder (from anterior vaginal wall lesion)   Operative Findings:   Parametrial involvement on right  Right sided subvaginal tumor burden ~3cm. This approximates the right ureteral insertion into the bladder based on my exam during cystoscopy. Extension of disease down upper half of vagina on lateral and posterior walls. Extension of disease down to lower third of anterior vagina (versus skip lesion) thus making her Stage 3A Cystoscopy revealed patent bilateral ureteral orifices and no bladder mucosa invasion or areas of concern. Rectal exam grossly no disease.     05/04/2018 Cancer Staging   Staging form: Cervix Uteri, AJCC 8th Edition - Clinical: Stage IIIB (cT3b,  cN1, cM0) - Signed by [name], MD on 05/04/2018    05/12/2018 Procedure   Successful 8 French right internal jugular vein power port placement with its tip at the SVC/RA junction   05/13/2018 - 06/24/2018 Chemotherapy   The patient had weekly cisplatin given with concurrent radiation    05/17/2018 - 07/27/2018 Radiation  Therapy   Radiation treatment dates:   05/17/2018-07/27/2018   Site/dose: 1. Cervix, 1.8 Gy in 25 fractions for a total dose of 45 Gy                    2. Pelvis  Boost, 1.8 Gy in 5 fractions for a total dose of 9 Gy                    3. Cervix, 5.5 Gy in 5 fractions for a total dose of 27.5 Gy   06/03/2018 Adverse Reaction   She missed her chemo due to severe depression and was sent to the ER for suicidal ideation.  She was seen by psychiatrist.   10/27/2018 PET scan   IMPRESSION: 1. Complete metabolic response to therapy, without residual or recurrent hypermetabolic disease. 2.  Aortic Atherosclerosis (ICD10-I70.0).     11/17/2018 Imaging   Successful right IJ vein Port-A-Cath explant.   03/29/2020 PET scan   1. Hypermetabolic lesion along the right vaginal cuff is highly worrisome for recurrent cervical cancer. No evidence of distant metastatic disease. 2.  Aortic atherosclerosis (ICD10-I70.0).   04/26/2020 - 12/10/2020 Chemotherapy   The patient had CARBOplatin  for chemotherapy treatment.      Genetic Testing   Patient has genetic testing done for PD-L1. Results revealed patient has the following:  PD-L1 combined positive score (CPS): 1%   07/08/2020 Imaging   1. Unchanged post treatment appearance of the pelvis, including a soft tissue nodule in the low right hemipelvis adjacent to the vagina measuring 2.8 x 1.7 cm, previously FDG avid and consistent with malignancy. 2. No evidence of nodal or distant metastatic disease in the abdomen or pelvis. 3. Aortic Atherosclerosis (ICD10-I70.0).   10/14/2020 Imaging   1. Unchanged right eccentric soft tissue mass in the low right hemipelvis centered on the cervix and vagina, measuring 2.9 x 1.9 cm. 2. New small volume fluid within the endometrial cavity, abnormal in the postmenopausal setting and suggesting obstruction of the cervical os by soft tissue mass. 3. No evidence of lymphadenopathy or distant metastatic disease in the abdomen  or pelvis.   Aortic Atherosclerosis (ICD10-I70.0).   12/24/2020 Imaging   1. Abnormal hypodense masslike appearance continuous with hypodense masslike lesion probably centrally within a right eccentric cervix (versus right paracervical mass). The endometrial portion has enlarged compared to 10/14/2020 and active malignancy is suspected. 2. No adenopathy or findings of distant spread. 3. Other imaging findings of potential clinical significance: Aortic Atherosclerosis (ICD10-I70.0). Mild cardiomegaly. Stable scarring in the left lower lobe. Sigmoid colon scattered diverticula. Tarlov cysts at the S2 level. Vertical sclerosis in the sacral ala possibly due to stress fractures or prior radiation therapy.     01/06/2021 - 06/19/2021 Chemotherapy   Patient is on Treatment Plan : UTERINE Pembrolizumab q21d      04/07/2021 Imaging   1. The previously hypermetabolic right vaginal mass currently measures 12 cubic cm in volume, previously 11 cubic cm on 12/23/2020. 2. No new adenopathy or new findings of distant metastatic spread. 3. New wall thickening in several loops of distal ileum suspicious for inflammation/enteritis. 4. Other imaging  findings of potential clinical significance: Mild cardiomegaly. Mild sigmoid colon diverticulosis. Stable bony demineralization with vertical sclerosis in the sacral ala likely from old insufficiency fracture.   07/10/2021 Imaging   1. New moderate right-sided hydroureteronephrosis with decreased perfusion to the right kidney compatible with obstructive uropathy.  2. Interval progression of the heterogeneous low-density uterine lesion, with progression of enhancing soft tissue extending inferiorly to the right, compatible with previously described disease in the right vaginal wall. Represents the site of right ureteral obstruction. 3. No evidence for new metastatic disease in the abdomen or pelvis. 4. Bilateral sacral insufficiency fractures. 5. Trace free fluid in the  pelvis. 6. Aortic Atherosclerosis (ICD10-I70.0).     07/22/2021 Procedure   Successful placement of a right internal jugular approach power injectable Port-A-Cath.     07/24/2021 - 10/02/2021 Chemotherapy   Patient is on Treatment Plan : cervical cancer Cisplatin q7d       10/16/2021 Imaging   1. Mild interval increase in size of heterogeneous tumor arising from within the uterus with local tumor extension into the right pelvic sidewall. 2. Persistent right-sided obstructive uropathy with severe right hydronephrosis and progressive right renal cortical volume loss. Right hydroureter extends into the pelvis where it is obstructed by the uterine/cervical mass. 3. Bilateral sacral insufficiency fractures. 4. Aortic Atherosclerosis (ICD10-I70.0).     PHYSICAL EXAMINATION: ECOG PERFORMANCE STATUS: 1 - Symptomatic but completely ambulatory  Vitals:   10/17/21 1253  BP: (!) 144/73  Pulse: 88  Resp: 18  Temp: 99.2 F (37.3 C)  SpO2: 99%   Filed Weights   10/17/21 1253  Weight: 91 lb (41.3 kg)    GENERAL:alert, no distress and comfortable NEURO: alert & oriented x 3 with fluent speech, no focal motor/sensory deficits  LABORATORY DATA:  I have reviewed the data as listed    Component Value Date/Time   NA 139 10/02/2021 0737   K 4.1 10/02/2021 0737   CL 108 10/02/2021 0737   CO2 23 10/02/2021 0737   GLUCOSE 90 10/02/2021 0737   BUN 22 10/02/2021 0737   CREATININE 1.23 (H) 10/02/2021 0737   CALCIUM 9.0 10/02/2021 0737   PROT 7.2 09/11/2021 1245   ALBUMIN 4.2 09/11/2021 1245   AST 16 09/11/2021 1245   AST 24 06/19/2021 0725   ALT 11 09/11/2021 1245   ALT 10 06/19/2021 0725   ALKPHOS 65 09/11/2021 1245   BILITOT 0.3 09/11/2021 1245   BILITOT 0.5 06/19/2021 0725   GFRNONAA 48 (L) 10/02/2021 0737   GFRAA >60 06/14/2020 0844   GFRAA >60 03/11/2020 0951    No results found for: SPEP, UPEP  Lab Results  Component Value Date   WBC 4.1 10/02/2021   NEUTROABS 2.7  10/02/2021   HGB 9.6 (L) 10/02/2021   HCT 29.3 (L) 10/02/2021   MCV 98.3 10/02/2021   PLT 151 10/02/2021      Chemistry      Component Value Date/Time   NA 139 10/02/2021 0737   K 4.1 10/02/2021 0737   CL 108 10/02/2021 0737   CO2 23 10/02/2021 0737   BUN 22 10/02/2021 0737   CREATININE 1.23 (H) 10/02/2021 0737      Component Value Date/Time   CALCIUM 9.0 10/02/2021 0737   ALKPHOS 65 09/11/2021 1245   AST 16 09/11/2021 1245   AST 24 06/19/2021 0725   ALT 11 09/11/2021 1245   ALT 10 06/19/2021 0725   BILITOT 0.3 09/11/2021 1245   BILITOT 0.5 06/19/2021 0725  RADIOGRAPHIC STUDIES: I have reviewed multiple imaging studies with the patient I have personally reviewed the radiological images as listed and agreed with the findings in the report. CT ABDOMEN PELVIS W CONTRAST  Result Date: 10/16/2021 CLINICAL DATA:  Assess treatment response. Malignant neoplasm of the cervix. EXAM: CT ABDOMEN AND PELVIS WITH CONTRAST TECHNIQUE: Multidetector CT imaging of the abdomen and pelvis was performed using the standard protocol following bolus administration of intravenous contrast. RADIATION DOSE REDUCTION: This exam was performed according to the departmental dose-optimization program which includes automated exposure control, adjustment of the mA and/or kV according to patient size and/or use of iterative reconstruction technique. CONTRAST:  13mL OMNIPAQUE IOHEXOL 300 MG/ML  SOLN COMPARISON:  07/09/2021 FINDINGS: Lower chest: Scarring within the left base is unchanged. No acute abnormality noted within the imaged portions of the lung bases. Hepatobiliary: No focal liver abnormality is seen. No gallstones, gallbladder wall thickening, or biliary dilatation. Pancreas: Unremarkable. No pancreatic ductal dilatation or surrounding inflammatory changes. Spleen: Normal in size without focal abnormality. Adrenals/Urinary Tract: Normal adrenal glands. Persistent right-sided obstructive uropathy is  identified with severe right hydronephrosis and progressive right renal cortical volume loss. Right hydroureter extends into the pelvis where it is obstructed by the uterine/cervical mass. Unremarkable appearance of the left kidney. Stomach/Bowel: Stomach appears normal. No bowel wall thickening, inflammation, or distension identified. Vascular/Lymphatic: Aortic atherosclerosis. No abdominopelvic adenopathy identified. Reproductive: Heterogeneous tumor arising from within the uterus with local tumor extension into the right pelvic sidewall is again noted. On today's study this measures 5.4 x 3.2 by 5.0 cm (volume = 45 cm^3), image 59/2 and image 43/5. On the previous exam this measured 4.9 by 2.6 by 4.8 cm (volume = 32 cm^3). Other: No ascites identified. No focal fluid collections. No signs of peritoneal disease. Musculoskeletal: Bones appear diffusely osteopenic. Bilateral sacral insufficiency fractures are again noted. No acute or suspicious osseous findings. IMPRESSION: 1. Mild interval increase in size of heterogeneous tumor arising from within the uterus with local tumor extension into the right pelvic sidewall. 2. Persistent right-sided obstructive uropathy with severe right hydronephrosis and progressive right renal cortical volume loss. Right hydroureter extends into the pelvis where it is obstructed by the uterine/cervical mass. 3. Bilateral sacral insufficiency fractures. 4. Aortic Atherosclerosis (ICD10-I70.0). Electronically Signed   By: Kerby Moors M.D.   On: 10/16/2021 10:38

## 2021-10-17 NOTE — Assessment & Plan Note (Signed)
I have reviewed imaging study with the patient Unfortunately, she has disease progression The patient has progressed on carboplatin, pembrolizumab as well as cisplatin The patient is not able to tell me whether she wants further palliative chemotherapy Her overall prognosis is poor due to progression of disease Her estimated any further treatment will give her very low benefit, likely under 20% or so If she desire further treatment, single agent Taxol, Taxotere or gemcitabine can be given but that would be at the expense of significant risk and side effects Ultimately, she is undecided She will call me next week after she made final decision about the next step

## 2021-10-24 ENCOUNTER — Telehealth: Payer: Self-pay

## 2021-10-24 NOTE — Telephone Encounter (Signed)
Called and given below message to [facility]. They have not made a decision at this time and still have questions.

## 2021-10-24 NOTE — Telephone Encounter (Signed)
Called and scheduled appt with Dr. Alvy Bimler at 8 am at 40 mins to review options. Fannie will come to appt with [place].

## 2021-10-24 NOTE — Telephone Encounter (Signed)
-----   Message from [name], MD sent at 10/24/2021  8:43 AM EST ----- She has not gotten back to [place] about her decision If she does not want chemo we need to set up palliative care consult and hospice

## 2021-10-30 ENCOUNTER — Encounter: Payer: Self-pay | Admitting: Hematology and Oncology

## 2021-10-30 ENCOUNTER — Inpatient Hospital Stay (HOSPITAL_BASED_OUTPATIENT_CLINIC_OR_DEPARTMENT_OTHER): Payer: Medicare Other | Admitting: Hematology and Oncology

## 2021-10-30 ENCOUNTER — Other Ambulatory Visit: Payer: Self-pay

## 2021-10-30 ENCOUNTER — Telehealth: Payer: Self-pay | Admitting: Hematology and Oncology

## 2021-10-30 VITALS — BP 126/74 | HR 76 | Temp 97.7°F | Resp 18 | Ht 62.0 in | Wt 89.8 lb

## 2021-10-30 DIAGNOSIS — D61818 Other pancytopenia: Secondary | ICD-10-CM

## 2021-10-30 DIAGNOSIS — C539 Malignant neoplasm of cervix uteri, unspecified: Secondary | ICD-10-CM

## 2021-10-30 DIAGNOSIS — E538 Deficiency of other specified B group vitamins: Secondary | ICD-10-CM

## 2021-10-30 DIAGNOSIS — Z7189 Other specified counseling: Secondary | ICD-10-CM

## 2021-10-30 DIAGNOSIS — Z5111 Encounter for antineoplastic chemotherapy: Secondary | ICD-10-CM | POA: Diagnosis not present

## 2021-10-30 DIAGNOSIS — N1330 Unspecified hydronephrosis: Secondary | ICD-10-CM

## 2021-10-30 MED ORDER — ONDANSETRON HCL 8 MG PO TABS: 8.0000 mg | ORAL_TABLET | Freq: Two times a day (BID) | ORAL | 1 refills | Status: AC | PRN

## 2021-10-30 MED ORDER — LIDOCAINE-PRILOCAINE 2.5-2.5 % EX CREA: TOPICAL_CREAM | CUTANEOUS | 3 refills | Status: DC

## 2021-10-30 MED ORDER — PROCHLORPERAZINE MALEATE 10 MG PO TABS: 10.0000 mg | ORAL_TABLET | Freq: Four times a day (QID) | ORAL | 1 refills | Status: AC | PRN

## 2021-10-30 NOTE — Assessment & Plan Note (Signed)
We discussed goals of care She is aware of low response rates We discussed the role of palliative treatment 

## 2021-10-30 NOTE — Telephone Encounter (Signed)
Scheduled appointment per 2/16 los. Talked with the patients friend [name] and we discussed upcoming appointments. [name] is aware.

## 2021-10-30 NOTE — Assessment & Plan Note (Signed)
The patient has progressed on multiple treatment but desire further chemotherapy She is frail with chronic pancytopenia despite significant dose adjustments and vitamin B12 supplementation After much discussion with the patient and caregiver, she is in agreement to proceed with single agent Taxol I will schedule weekly Taxol for the first 2 weeks and reassess I recommend minimum 3 months of treatment before repeat imaging study

## 2021-10-30 NOTE — Progress Notes (Signed)
DISCONTINUE ON PATHWAY REGIMEN - Other  No Medical Intervention - Off Treatment.  REASON: Disease Progression PRIOR TREATMENT: Off Treatment  START OFF PATHWAY REGIMEN - Other   OFF00010:Paclitaxel 80 mg/m2 Weekly:   Administer weekly:     Paclitaxel   **Always confirm dose/schedule in your pharmacy ordering system**  Patient Characteristics: Intent of Therapy: Non-Curative / Palliative Intent, Discussed with Patient

## 2021-10-30 NOTE — Assessment & Plan Note (Signed)
She had recent pancytopenia due to treatment Observe closely

## 2021-10-30 NOTE — Assessment & Plan Note (Signed)
She has hydronephrosis but renal function is reasonably stable I do not recommend stent placement

## 2021-10-30 NOTE — Progress Notes (Signed)
[place] OFFICE PROGRESS NOTE  Patient Care Team: Default, Provider, MD as PCP - General  ASSESSMENT & PLAN:  Malignant neoplasm of cervix [facility]) The patient has progressed on multiple treatment but desire further chemotherapy She is frail with chronic pancytopenia despite significant dose adjustments and vitamin B12 supplementation After much discussion with the patient and caregiver, she is in agreement to proceed with single agent Taxol I will schedule weekly Taxol for the first 2 weeks and reassess I recommend minimum 3 months of treatment before repeat imaging study  Vitamin B12 deficiency I plan to recheck vitamin B12 level in her next visit  Pancytopenia, acquired ([place]) She had recent pancytopenia due to treatment Observe closely  Hydronephrosis of right kidney She has hydronephrosis but renal function is reasonably stable I do not recommend stent placement  Goals of care, counseling/discussion We discussed goals of care She is aware of low response rates We discussed the role of palliative treatment  Orders Placed This Encounter  Procedures   CBC with Differential ([place] Only)    Standing Status:   Standing    Number of Occurrences:   20    Standing Expiration Date:   10/30/2022   CMP ([place] only)    Standing Status:   Standing    Number of Occurrences:   20    Standing Expiration Date:   10/30/2022    All questions were answered. The patient knows to call the clinic with any problems, questions or concerns. The total time spent in the appointment was 55 minutes encounter with patients including review of chart and various tests results, discussions about plan of care and coordination of care plan   [name], MD 10/30/2021 70:32 AM  INTERVAL HISTORY: Please see below for problem oriented charting. she returns for treatment follow-up with her caregiver She is not symptomatic No pelvic pain, abnormal bleeding or discharge We  spent majority of the time discussing next treatment available and goals of care  REVIEW OF SYSTEMS:   Constitutional: Denies fevers, chills or abnormal weight loss Eyes: Denies blurriness of vision Ears, nose, mouth, throat, and face: Denies mucositis or sore throat Respiratory: Denies cough, dyspnea or wheezes Cardiovascular: Denies palpitation, chest discomfort or lower extremity swelling Gastrointestinal:  Denies nausea, heartburn or change in bowel habits Skin: Denies abnormal skin rashes Lymphatics: Denies new lymphadenopathy or easy bruising Neurological:Denies numbness, tingling or new weaknesses Behavioral/Psych: Mood is stable, no new changes  All other systems were reviewed with the patient and are negative.  I have reviewed the past medical history, past surgical history, social history and family history with the patient and they are unchanged from previous note.  ALLERGIES:  has No Known Allergies.  MEDICATIONS:  Current Outpatient Medications  Medication Sig Dispense Refill   levothyroxine (SYNTHROID) 100 MCG tablet Take 1 tablet (100 mcg total) by mouth daily before breakfast. 30 tablet 0   lidocaine-prilocaine (EMLA) cream Apply to affected area once 30 g 3   Multiple Vitamin (MULTIVITAMIN) tablet Take 1 tablet by mouth daily.     ondansetron (ZOFRAN) 8 MG tablet Take 1 tablet (8 mg total) by mouth 2 (two) times daily as needed (Nausea or vomiting). 30 tablet 1   prochlorperazine (COMPAZINE) 10 MG tablet Take 1 tablet (10 mg total) by mouth every 6 (six) hours as needed (Nausea or vomiting). 30 tablet 1   No current facility-administered medications for this visit.    SUMMARY OF ONCOLOGIC HISTORY: Oncology History Overview Note  Cancer Staging Malignant neoplasm of cervix ([place]) Staging form: Cervix Uteri, AJCC 8th Edition - Clinical: Stage IIIB (cT3b, cN1, cM0) - Signed by [name], MD on 05/04/2018  PD-L1 1% Progressed on carboplatin, pembrolizumab and  cisplatin   Malignant neoplasm of cervix ([place])  03/13/2018 Imaging   US pelvis There are mobile echogenic foci within the endometrial cavity suggestive of gas. This is nonspecific in etiology however may be secondary to endometritis. Correlate for history of recent instrumentation. This obscures visualization of the endometrial tissue and therefore a follow-up pelvic ultrasound in 2-4 weeks is recommended after resolution of the acute symptomatology if symptoms persist to further evaluate the endometrium.     03/13/2018 Initial Diagnosis   She initially presented with PMB   04/12/2018 Pathology Results   Cervix, biopsy, mass - INVASIVE SQUAMOUS CELL CARCINOMA - SEE COMMENT   04/12/2018 Surgery   PREOPERATIVE DIAGNOSIS:  Postmenopausal vaginal bleeding. POSTOPERATIVE DIAGNOSIS: The same PROCEDURE: Exam under anesthesia, pap smear, cervical mass biopsy SURGEON:  Dr. [name]   INDICATIONS: 71 y.o. yo [id number] with PMB here for exam under anesthesia.Risks of surgery were discussed with the patient including but not limited to: bleeding which may require transfusion; infection which may require antibiotics; injury to uterus or surrounding organs; need for additional procedures including laparotomy or laparoscopy; and other postoperative/anesthesia complications. Written informed consent was obtained.     FINDINGS:  An 8-week size midline uterus.  No adnexal mass palpable on exam. Normal cervix not visualized. Friable mass seen in involving the vagina at the level of the cervix obliterating the anterior and posterior fornix.   ANESTHESIA:   General INTRAVENOUS FLUIDS:  300 ml of LR ESTIMATED BLOOD LOSS: 20 ml. SPECIMENS: pap smear, cervical mass biopsy COMPLICATIONS:  None immediate.     04/25/2018 PET scan   1. Large cervical mass may invade into the myometrium in down into the vagina, maximum SUV 21.3. There is a malignant left external iliac node measuring 1.4 cm in short axis with  maximum SUV 14.1. 2. Accentuated activity in the cecum and ascending colon is likely physiologic given that it has no CT correlate. Correlation with the patient's colon cancer screening history is recommended. If screening is not up-to-date, appropriate screening should be considered. 3.  Aortic Atherosclerosis (ICD10-I70.0).     04/28/2018 Surgery   Pre-operative Diagnosis:  At least Stage 2B SCCa Cervical Cancer   Post-operative Diagnosis: At least clinical Stage 3A SCCa Cervical Cancer   Operation:  Exam under anesthesia due to intolerance for vaginal exam in office Cystoscopy due to concern for extension to posterior bladder (from anterior vaginal wall lesion)   Operative Findings:   Parametrial involvement on right  Right sided subvaginal tumor burden ~3cm. This approximates the right ureteral insertion into the bladder based on my exam during cystoscopy. Extension of disease down upper half of vagina on lateral and posterior walls. Extension of disease down to lower third of anterior vagina (versus skip lesion) thus making her Stage 3A Cystoscopy revealed patent bilateral ureteral orifices and no bladder mucosa invasion or areas of concern. Rectal exam grossly no disease.     05/04/2018 Cancer Staging   Staging form: Cervix Uteri, AJCC 8th Edition - Clinical: Stage IIIB (cT3b, cN1, cM0) - Signed by [name], MD on 05/04/2018    05/12/2018 Procedure   Successful 8 French right internal jugular vein power port placement with its tip at the SVC/RA junction   05/13/2018 - 06/24/2018 Chemotherapy   The patient  had weekly cisplatin given with concurrent radiation    05/17/2018 - 07/27/2018 Radiation Therapy   Radiation treatment dates:   05/17/2018-07/27/2018   Site/dose: 1. Cervix, 1.8 Gy in 25 fractions for a total dose of 45 Gy                    2. Pelvis  Boost, 1.8 Gy in 5 fractions for a total dose of 9 Gy                    3. Cervix, 5.5 Gy in 5 fractions for a total dose  of 27.5 Gy   06/03/2018 Adverse Reaction   She missed her chemo due to severe depression and was sent to the ER for suicidal ideation.  She was seen by psychiatrist.   10/27/2018 PET scan   IMPRESSION: 1. Complete metabolic response to therapy, without residual or recurrent hypermetabolic disease. 2.  Aortic Atherosclerosis (ICD10-I70.0).     11/17/2018 Imaging   Successful right IJ vein Port-A-Cath explant.   03/29/2020 PET scan   1. Hypermetabolic lesion along the right vaginal cuff is highly worrisome for recurrent cervical cancer. No evidence of distant metastatic disease. 2.  Aortic atherosclerosis (ICD10-I70.0).   04/26/2020 - 12/10/2020 Chemotherapy   The patient had CARBOplatin  for chemotherapy treatment.      Genetic Testing   Patient has genetic testing done for PD-L1. Results revealed patient has the following:  PD-L1 combined positive score (CPS): 1%   07/08/2020 Imaging   1. Unchanged post treatment appearance of the pelvis, including a soft tissue nodule in the low right hemipelvis adjacent to the vagina measuring 2.8 x 1.7 cm, previously FDG avid and consistent with malignancy. 2. No evidence of nodal or distant metastatic disease in the abdomen or pelvis. 3. Aortic Atherosclerosis (ICD10-I70.0).   10/14/2020 Imaging   1. Unchanged right eccentric soft tissue mass in the low right hemipelvis centered on the cervix and vagina, measuring 2.9 x 1.9 cm. 2. New small volume fluid within the endometrial cavity, abnormal in the postmenopausal setting and suggesting obstruction of the cervical os by soft tissue mass. 3. No evidence of lymphadenopathy or distant metastatic disease in the abdomen or pelvis.   Aortic Atherosclerosis (ICD10-I70.0).   12/24/2020 Imaging   1. Abnormal hypodense masslike appearance continuous with hypodense masslike lesion probably centrally within a right eccentric cervix (versus right paracervical mass). The endometrial portion has enlarged  compared to 10/14/2020 and active malignancy is suspected. 2. No adenopathy or findings of distant spread. 3. Other imaging findings of potential clinical significance: Aortic Atherosclerosis (ICD10-I70.0). Mild cardiomegaly. Stable scarring in the left lower lobe. Sigmoid colon scattered diverticula. Tarlov cysts at the S2 level. Vertical sclerosis in the sacral ala possibly due to stress fractures or prior radiation therapy.     01/06/2021 - 06/19/2021 Chemotherapy   Patient is on Treatment Plan : UTERINE Pembrolizumab q21d      04/07/2021 Imaging   1. The previously hypermetabolic right vaginal mass currently measures 12 cubic cm in volume, previously 11 cubic cm on 12/23/2020. 2. No new adenopathy or new findings of distant metastatic spread. 3. New wall thickening in several loops of distal ileum suspicious for inflammation/enteritis. 4. Other imaging findings of potential clinical significance: Mild cardiomegaly. Mild sigmoid colon diverticulosis. Stable bony demineralization with vertical sclerosis in the sacral ala likely from old insufficiency fracture.   07/10/2021 Imaging   1. New moderate right-sided hydroureteronephrosis with decreased perfusion to the right kidney  compatible with obstructive uropathy.  2. Interval progression of the heterogeneous low-density uterine lesion, with progression of enhancing soft tissue extending inferiorly to the right, compatible with previously described disease in the right vaginal wall. Represents the site of right ureteral obstruction. 3. No evidence for new metastatic disease in the abdomen or pelvis. 4. Bilateral sacral insufficiency fractures. 5. Trace free fluid in the pelvis. 6. Aortic Atherosclerosis (ICD10-I70.0).     07/22/2021 Procedure   Successful placement of a right internal jugular approach power injectable Port-A-Cath.     07/24/2021 - 10/02/2021 Chemotherapy   Patient is on Treatment Plan : cervical cancer Cisplatin q7d        10/16/2021 Imaging   1. Mild interval increase in size of heterogeneous tumor arising from within the uterus with local tumor extension into the right pelvic sidewall. 2. Persistent right-sided obstructive uropathy with severe right hydronephrosis and progressive right renal cortical volume loss. Right hydroureter extends into the pelvis where it is obstructed by the uterine/cervical mass. 3. Bilateral sacral insufficiency fractures. 4. Aortic Atherosclerosis (ICD10-I70.0).   11/06/2021 -  Chemotherapy   Patient is on Treatment Plan : Cervical Paclitaxel q7d       PHYSICAL EXAMINATION: ECOG PERFORMANCE STATUS: 1 - Symptomatic but completely ambulatory  Vitals:   10/30/21 0806  BP: 126/74  Pulse: 76  Resp: 18  Temp: 97.7 F (36.5 C)  SpO2: 100%   Filed Weights   10/30/21 0806  Weight: 89 lb 12.8 oz (40.7 kg)    GENERAL:alert, no distress and comfortable.  She looks thin and cachectic NEURO: alert & oriented x 3 with fluent speech, no focal motor/sensory deficits  LABORATORY DATA:  I have reviewed the data as listed    Component Value Date/Time   NA 139 10/02/2021 0737   K 4.1 10/02/2021 0737   CL 108 10/02/2021 0737   CO2 23 10/02/2021 0737   GLUCOSE 90 10/02/2021 0737   BUN 22 10/02/2021 0737   CREATININE 1.23 (H) 10/02/2021 0737   CALCIUM 9.0 10/02/2021 0737   PROT 7.2 09/11/2021 1245   ALBUMIN 4.2 09/11/2021 1245   AST 16 09/11/2021 1245   AST 24 06/19/2021 0725   ALT 11 09/11/2021 1245   ALT 10 06/19/2021 0725   ALKPHOS 65 09/11/2021 1245   BILITOT 0.3 09/11/2021 1245   BILITOT 0.5 06/19/2021 0725   GFRNONAA 48 (L) 10/02/2021 0737   GFRAA >60 06/14/2020 0844   GFRAA >60 03/11/2020 0951    No results found for: SPEP, UPEP  Lab Results  Component Value Date   WBC 4.1 10/02/2021   NEUTROABS 2.7 10/02/2021   HGB 9.6 (L) 10/02/2021   HCT 29.3 (L) 10/02/2021   MCV 98.3 10/02/2021   PLT 151 10/02/2021      Chemistry      Component Value Date/Time    NA 139 10/02/2021 0737   K 4.1 10/02/2021 0737   CL 108 10/02/2021 0737   CO2 23 10/02/2021 0737   BUN 22 10/02/2021 0737   CREATININE 1.23 (H) 10/02/2021 0737      Component Value Date/Time   CALCIUM 9.0 10/02/2021 0737   ALKPHOS 65 09/11/2021 1245   AST 16 09/11/2021 1245   AST 24 06/19/2021 0725   ALT 11 09/11/2021 1245   ALT 10 06/19/2021 0725   BILITOT 0.3 09/11/2021 1245   BILITOT 0.5 06/19/2021 0725       RADIOGRAPHIC STUDIES: I have personally reviewed the radiological images as listed and agreed with the findings  in the report. CT ABDOMEN PELVIS W CONTRAST  Result Date: 10/16/2021 CLINICAL DATA:  Assess treatment response. Malignant neoplasm of the cervix. EXAM: CT ABDOMEN AND PELVIS WITH CONTRAST TECHNIQUE: Multidetector CT imaging of the abdomen and pelvis was performed using the standard protocol following bolus administration of intravenous contrast. RADIATION DOSE REDUCTION: This exam was performed according to the departmental dose-optimization program which includes automated exposure control, adjustment of the mA and/or kV according to patient size and/or use of iterative reconstruction technique. CONTRAST:  3mL OMNIPAQUE IOHEXOL 300 MG/ML  SOLN COMPARISON:  07/09/2021 FINDINGS: Lower chest: Scarring within the left base is unchanged. No acute abnormality noted within the imaged portions of the lung bases. Hepatobiliary: No focal liver abnormality is seen. No gallstones, gallbladder wall thickening, or biliary dilatation. Pancreas: Unremarkable. No pancreatic ductal dilatation or surrounding inflammatory changes. Spleen: Normal in size without focal abnormality. Adrenals/Urinary Tract: Normal adrenal glands. Persistent right-sided obstructive uropathy is identified with severe right hydronephrosis and progressive right renal cortical volume loss. Right hydroureter extends into the pelvis where it is obstructed by the uterine/cervical mass. Unremarkable appearance of the left  kidney. Stomach/Bowel: Stomach appears normal. No bowel wall thickening, inflammation, or distension identified. Vascular/Lymphatic: Aortic atherosclerosis. No abdominopelvic adenopathy identified. Reproductive: Heterogeneous tumor arising from within the uterus with local tumor extension into the right pelvic sidewall is again noted. On today's study this measures 5.4 x 3.2 by 5.0 cm (volume = 45 cm^3), image 59/2 and image 43/5. On the previous exam this measured 4.9 by 2.6 by 4.8 cm (volume = 32 cm^3). Other: No ascites identified. No focal fluid collections. No signs of peritoneal disease. Musculoskeletal: Bones appear diffusely osteopenic. Bilateral sacral insufficiency fractures are again noted. No acute or suspicious osseous findings. IMPRESSION: 1. Mild interval increase in size of heterogeneous tumor arising from within the uterus with local tumor extension into the right pelvic sidewall. 2. Persistent right-sided obstructive uropathy with severe right hydronephrosis and progressive right renal cortical volume loss. Right hydroureter extends into the pelvis where it is obstructed by the uterine/cervical mass. 3. Bilateral sacral insufficiency fractures. 4. Aortic Atherosclerosis (ICD10-I70.0). Electronically Signed   By: Kerby Moors M.D.   On: 10/16/2021 10:38

## 2021-10-30 NOTE — Progress Notes (Signed)
DISCONTINUE OFF PATHWAY REGIMEN - Other   [id number]:Cisplatin 35 mg/m2 q7 Days + RT:   A cycle is every 7 days:     Cisplatin   **Always confirm dose/schedule in your pharmacy ordering system**  REASON: Disease Progression PRIOR TREATMENT: Cisplatin 35 mg/m2 q7 Days + RT TREATMENT RESPONSE: Progressive Disease (PD)  Other - No Medical Intervention - Off Treatment.  Patient Characteristics:

## 2021-10-30 NOTE — Assessment & Plan Note (Signed)
I plan to recheck vitamin B12 level in her next visit

## 2021-11-05 MED FILL — Dexamethasone Sodium Phosphate Inj 100 MG/10ML: INTRAMUSCULAR | Qty: 1 | Status: AC

## 2021-11-06 ENCOUNTER — Inpatient Hospital Stay: Payer: Medicare Other

## 2021-11-06 ENCOUNTER — Inpatient Hospital Stay: Payer: Medicare Other | Admitting: Nutrition

## 2021-11-06 ENCOUNTER — Other Ambulatory Visit: Payer: Self-pay

## 2021-11-06 VITALS — BP 135/80 | HR 80 | Temp 98.5°F | Resp 18 | Wt 89.2 lb

## 2021-11-06 DIAGNOSIS — Z7189 Other specified counseling: Secondary | ICD-10-CM

## 2021-11-06 DIAGNOSIS — C539 Malignant neoplasm of cervix uteri, unspecified: Secondary | ICD-10-CM

## 2021-11-06 DIAGNOSIS — E039 Hypothyroidism, unspecified: Secondary | ICD-10-CM

## 2021-11-06 DIAGNOSIS — Z5111 Encounter for antineoplastic chemotherapy: Secondary | ICD-10-CM | POA: Diagnosis not present

## 2021-11-06 LAB — CMP (CANCER CENTER ONLY)
ALT: 6 U/L (ref 0–44)
AST: 13 U/L — ABNORMAL LOW (ref 15–41)
Albumin: 4.2 g/dL (ref 3.5–5.0)
Alkaline Phosphatase: 60 U/L (ref 38–126)
Anion gap: 8 (ref 5–15)
BUN: 32 mg/dL — ABNORMAL HIGH (ref 8–23)
CO2: 25 mmol/L (ref 22–32)
Calcium: 8.8 mg/dL — ABNORMAL LOW (ref 8.9–10.3)
Chloride: 107 mmol/L (ref 98–111)
Creatinine: 1.38 mg/dL — ABNORMAL HIGH (ref 0.44–1.00)
GFR, Estimated: 41 mL/min — ABNORMAL LOW (ref >60)
Glucose, Bld: 95 mg/dL (ref 70–99)
Potassium: 3.8 mmol/L (ref 3.5–5.1)
Sodium: 140 mmol/L (ref 135–145)
Total Bilirubin: 0.3 mg/dL (ref 0.3–1.2)
Total Protein: 6.9 g/dL (ref 6.5–8.1)

## 2021-11-06 LAB — CBC WITH DIFFERENTIAL (CANCER CENTER ONLY)
Abs Immature Granulocytes: 0.03 10*3/uL (ref 0.00–0.07)
Basophils Absolute: 0 10*3/uL (ref 0.0–0.1)
Basophils Relative: 0 %
Eosinophils Absolute: 0 10*3/uL (ref 0.0–0.5)
Eosinophils Relative: 0 %
HCT: 30.5 % — ABNORMAL LOW (ref 36.0–46.0)
Hemoglobin: 9.8 g/dL — ABNORMAL LOW (ref 12.0–15.0)
Immature Granulocytes: 0 %
Lymphocytes Relative: 8 %
Lymphs Abs: 0.7 10*3/uL (ref 0.7–4.0)
MCH: 32 pg (ref 26.0–34.0)
MCHC: 32.1 g/dL (ref 30.0–36.0)
MCV: 99.7 fL (ref 80.0–100.0)
Monocytes Absolute: 0.8 10*3/uL (ref 0.1–1.0)
Monocytes Relative: 10 %
Neutro Abs: 6.5 10*3/uL (ref 1.7–7.7)
Neutrophils Relative %: 82 %
Platelet Count: 167 10*3/uL (ref 150–400)
RBC: 3.06 MIL/uL — ABNORMAL LOW (ref 3.87–5.11)
RDW: 13.5 % (ref 11.5–15.5)
WBC Count: 8.1 10*3/uL (ref 4.0–10.5)
nRBC: 0 % (ref 0.0–0.2)

## 2021-11-06 LAB — TSH: TSH: 1.725 u[IU]/mL (ref 0.308–3.960)

## 2021-11-06 MED ORDER — SODIUM CHLORIDE 0.9 % IV SOLN
80.0000 mg/m2 | Freq: Once | INTRAVENOUS | Status: AC
  Administered 2021-11-06: 108 mg via INTRAVENOUS
  Filled 2021-11-06: qty 18

## 2021-11-06 MED ORDER — SODIUM CHLORIDE 0.9 % IV SOLN
10.0000 mg | Freq: Once | INTRAVENOUS | Status: AC
  Administered 2021-11-06: 10 mg via INTRAVENOUS
  Filled 2021-11-06: qty 10

## 2021-11-06 MED ORDER — FAMOTIDINE IN NACL 20-0.9 MG/50ML-% IV SOLN
20.0000 mg | Freq: Once | INTRAVENOUS | Status: AC
  Administered 2021-11-06: 20 mg via INTRAVENOUS
  Filled 2021-11-06: qty 50

## 2021-11-06 MED ORDER — HEPARIN SOD (PORK) LOCK FLUSH 100 UNIT/ML IV SOLN
500.0000 [IU] | Freq: Once | INTRAVENOUS | Status: AC | PRN
  Administered 2021-11-06: 500 [IU]

## 2021-11-06 MED ORDER — DIPHENHYDRAMINE HCL 50 MG/ML IJ SOLN
12.5000 mg | Freq: Once | INTRAMUSCULAR | Status: AC
  Administered 2021-11-06: 12.5 mg via INTRAVENOUS
  Filled 2021-11-06: qty 1

## 2021-11-06 MED ORDER — SODIUM CHLORIDE 0.9 % IV SOLN: Freq: Once | INTRAVENOUS | Status: AC

## 2021-11-06 MED ORDER — SODIUM CHLORIDE 0.9% FLUSH
10.0000 mL | INTRAVENOUS | Status: DC | PRN
  Administered 2021-11-06: 10 mL

## 2021-11-06 NOTE — Patient Instructions (Signed)
[facility]  Discharge Instructions: Thank you for choosing [facility] to provide your oncology and hematology care.   If you have a lab appointment with the [facility], please go directly to the [facility] and check in at the registration area.   Wear comfortable clothing and clothing appropriate for easy access to any Portacath or PICC line.   We strive to give you quality time with your provider. You may need to reschedule your appointment if you arrive late (15 or more minutes).  Arriving late affects you and other patients whose appointments are after yours.  Also, if you miss three or more appointments without notifying the office, you may be dismissed from the clinic at the providers discretion.      For prescription refill requests, have your pharmacy contact our office and allow 72 hours for refills to be completed.    Today you received the following chemotherapy and/or immunotherapy agents: Paclitaxel (Taxol)   To help prevent nausea and vomiting after your treatment, we encourage you to take your nausea medication as directed.  BELOW ARE SYMPTOMS THAT SHOULD BE REPORTED IMMEDIATELY: *FEVER GREATER THAN 100.4 F (38 C) OR HIGHER *CHILLS OR SWEATING *NAUSEA AND VOMITING THAT IS NOT CONTROLLED WITH YOUR NAUSEA MEDICATION *UNUSUAL SHORTNESS OF BREATH *UNUSUAL BRUISING OR BLEEDING *URINARY PROBLEMS (pain or burning when urinating, or frequent urination) *BOWEL PROBLEMS (unusual diarrhea, constipation, pain near the anus) TENDERNESS IN MOUTH AND THROAT WITH OR WITHOUT PRESENCE OF ULCERS (sore throat, sores in mouth, or a toothache) UNUSUAL RASH, SWELLING OR PAIN  UNUSUAL VAGINAL DISCHARGE OR ITCHING   Items with * indicate a potential emergency and should be followed up as soon as possible or go to the Emergency Department if any problems should occur.  Please show the CHEMOTHERAPY ALERT CARD or IMMUNOTHERAPY ALERT CARD at  check-in to the Emergency Department and triage nurse.  Should you have questions after your visit or need to cancel or reschedule your appointment, please contact [facility]  Dept: [phone]  and follow the prompts.  Office hours are 8:00 a.m. to 4:30 p.m. Monday - Friday. Please note that voicemails left after 4:00 p.m. may not be returned until the following business day.  We are closed weekends and major holidays. You have access to a nurse at all times for urgent questions. Please call the main number to the clinic Dept: [phone] and follow the prompts.   For any non-urgent questions, you may also contact your provider using MyChart. We now offer e-Visits for anyone 23 and older to request care online for non-urgent symptoms. For details visit [email or web address].   Also download the MyChart app! Go to the app store, search "MyChart", open the app, select [facility], and log in with your MyChart username and password.  Due to Covid, a mask is required upon entering the hospital/clinic. If you do not have a mask, one will be given to you upon arrival. For doctor visits, patients may have 1 support person aged 56 or older with them. For treatment visits, patients cannot have anyone with them due to current Covid guidelines and our immunocompromised population.   Paclitaxel injection What is this medication? PACLITAXEL (PAK li TAX el) is a chemotherapy drug. It targets fast dividing cells, like cancer cells, and causes these cells to die. This medicine is used to treat ovarian cancer, breast cancer, lung cancer, Kaposi's sarcoma, and other cancers. This medicine may be used  for other purposes; ask your health care provider or pharmacist if you have questions. COMMON BRAND NAME(S): Onxol, Taxol What should I tell my care team before I take this medication? They need to know if you have any of these conditions: history of irregular heartbeat liver  disease low blood counts, like low white cell, platelet, or red cell counts lung or breathing disease, like asthma tingling of the fingers or toes, or other nerve disorder an unusual or allergic reaction to paclitaxel, alcohol, polyoxyethylated castor oil, other chemotherapy, other medicines, foods, dyes, or preservatives pregnant or trying to get pregnant breast-feeding How should I use this medication? This drug is given as an infusion into a vein. It is administered in a hospital or clinic by a specially trained health care professional. Talk to your pediatrician regarding the use of this medicine in children. Special care may be needed. Overdosage: If you think you have taken too much of this medicine contact a poison control center or emergency room at once. NOTE: This medicine is only for you. Do not share this medicine with others. What if I miss a dose? It is important not to miss your dose. Call your doctor or health care professional if you are unable to keep an appointment. What may interact with this medication? Do not take this medicine with any of the following medications: live virus vaccines This medicine may also interact with the following medications: antiviral medicines for hepatitis, HIV or AIDS certain antibiotics like erythromycin and clarithromycin certain medicines for fungal infections like ketoconazole and itraconazole certain medicines for seizures like carbamazepine, phenobarbital, phenytoin gemfibrozil nefazodone rifampin St. John's wort This list may not describe all possible interactions. Give your health care provider a list of all the medicines, herbs, non-prescription drugs, or dietary supplements you use. Also tell them if you smoke, drink alcohol, or use illegal drugs. Some items may interact with your medicine. What should I watch for while using this medication? Your condition will be monitored carefully while you are receiving this medicine. You  will need important blood work done while you are taking this medicine. This medicine can cause serious allergic reactions. To reduce your risk you will need to take other medicine(s) before treatment with this medicine. If you experience allergic reactions like skin rash, itching or hives, swelling of the face, lips, or tongue, tell your doctor or health care professional right away. In some cases, you may be given additional medicines to help with side effects. Follow all directions for their use. This drug may make you feel generally unwell. This is not uncommon, as chemotherapy can affect healthy cells as well as cancer cells. Report any side effects. Continue your course of treatment even though you feel ill unless your doctor tells you to stop. Call your doctor or health care professional for advice if you get a fever, chills or sore throat, or other symptoms of a cold or flu. Do not treat yourself. This drug decreases your body's ability to fight infections. Try to avoid being around people who are sick. This medicine may increase your risk to bruise or bleed. Call your doctor or health care professional if you notice any unusual bleeding. Be careful brushing and flossing your teeth or using a toothpick because you may get an infection or bleed more easily. If you have any dental work done, tell your dentist you are receiving this medicine. Avoid taking products that contain aspirin, acetaminophen, ibuprofen, naproxen, or ketoprofen unless instructed by your doctor. These  medicines may hide a fever. Do not become pregnant while taking this medicine. Women should inform their doctor if they wish to become pregnant or think they might be pregnant. There is a potential for serious side effects to an unborn child. Talk to your health care professional or pharmacist for more information. Do not breast-feed an infant while taking this medicine. Men are advised not to father a child while receiving this  medicine. This product may contain alcohol. Ask your pharmacist or healthcare provider if this medicine contains alcohol. Be sure to tell all healthcare providers you are taking this medicine. Certain medicines, like metronidazole and disulfiram, can cause an unpleasant reaction when taken with alcohol. The reaction includes flushing, headache, nausea, vomiting, sweating, and increased thirst. The reaction can last from 30 minutes to several hours. What side effects may I notice from receiving this medication? Side effects that you should report to your doctor or health care professional as soon as possible: allergic reactions like skin rash, itching or hives, swelling of the face, lips, or tongue breathing problems changes in vision fast, irregular heartbeat high or low blood pressure mouth sores pain, tingling, numbness in the hands or feet signs of decreased platelets or bleeding - bruising, pinpoint red spots on the skin, black, tarry stools, blood in the urine signs of decreased red blood cells - unusually weak or tired, feeling faint or lightheaded, falls signs of infection - fever or chills, cough, sore throat, pain or difficulty passing urine signs and symptoms of liver injury like dark yellow or brown urine; general ill feeling or flu-like symptoms; light-colored stools; loss of appetite; nausea; right upper belly pain; unusually weak or tired; yellowing of the eyes or skin swelling of the ankles, feet, hands unusually slow heartbeat Side effects that usually do not require medical attention (report to your doctor or health care professional if they continue or are bothersome): diarrhea hair loss loss of appetite muscle or joint pain nausea, vomiting pain, redness, or irritation at site where injected tiredness This list may not describe all possible side effects. Call your doctor for medical advice about side effects. You may report side effects to FDA at 1-800-FDA-1088. Where  should I keep my medication? This drug is given in a hospital or clinic and will not be stored at home. NOTE: This sheet is a summary. It may not cover all possible information. If you have questions about this medicine, talk to your doctor, pharmacist, or health care provider.  2022 Elsevier/Gold Standard (2021-05-20 00:00:00)

## 2021-11-06 NOTE — Progress Notes (Signed)
Nutrition follow-up completed with patient during infusion for cervical cancer.  Weight documented as 89.25 pounds February 23.  This is decreased from 93 pounds January 19.  No new labs.  Patient reports her appetite is poor and she would like to know how to increase her appetite. She can only drink one third of 1 carton boost every day.  States she does not like them too much. Patient denies nutrition impact symptoms other than poor appetite.  Nutrition diagnosis: Food and nutrition related knowledge deficit continues.  Intervention: Educated patient to try to eat small more frequent meals and snacks every 2-3 hours. Suggested patient try Ensure clear.  Provided sample today and patient stated she likes it.  Provided coupons. Recommended she drink Ensure clear 1-2 bottles daily.  Educated that this does not have the same calorie level as Ensure Plus however it could benefit her if she can drink more.  Monitoring, evaluation, goals: Patient will work to increase calories and protein to minimize weight loss.  Next visit: We will continue to follow as needed.  **Disclaimer: This note was dictated with voice recognition software. Similar sounding words can inadvertently be transcribed and this note may contain transcription errors which may not have been corrected upon publication of note.**

## 2021-11-07 ENCOUNTER — Telehealth: Payer: Self-pay

## 2021-11-07 NOTE — Telephone Encounter (Signed)
Called to follow up after treatment yesterday. Spoke with [name] and Makhya is doing well. Instructed to call the office back for questions or concerns. She verbalized understanding.

## 2021-11-07 NOTE — Telephone Encounter (Signed)
-----   Message from Willis Modena, RN sent at 11/06/2021  5:02 PM EST ----- Regarding: Dr. Alvy Bimler 1st time Taxol f/u-tol well Call back 11/07/21

## 2021-11-13 MED FILL — Dexamethasone Sodium Phosphate Inj 100 MG/10ML: INTRAMUSCULAR | Qty: 1 | Status: AC

## 2021-11-14 ENCOUNTER — Inpatient Hospital Stay: Payer: Medicare Other

## 2021-11-14 ENCOUNTER — Other Ambulatory Visit: Payer: Self-pay | Admitting: Hematology and Oncology

## 2021-11-14 ENCOUNTER — Encounter: Payer: Self-pay | Admitting: Hematology and Oncology

## 2021-11-14 ENCOUNTER — Inpatient Hospital Stay (HOSPITAL_BASED_OUTPATIENT_CLINIC_OR_DEPARTMENT_OTHER): Payer: Medicare Other | Admitting: Hematology and Oncology

## 2021-11-14 ENCOUNTER — Inpatient Hospital Stay: Payer: Medicare Other | Attending: Hematology and Oncology

## 2021-11-14 ENCOUNTER — Other Ambulatory Visit: Payer: Self-pay

## 2021-11-14 DIAGNOSIS — Z7189 Other specified counseling: Secondary | ICD-10-CM

## 2021-11-14 DIAGNOSIS — Z5111 Encounter for antineoplastic chemotherapy: Secondary | ICD-10-CM | POA: Insufficient documentation

## 2021-11-14 DIAGNOSIS — E538 Deficiency of other specified B group vitamins: Secondary | ICD-10-CM

## 2021-11-14 DIAGNOSIS — Z9221 Personal history of antineoplastic chemotherapy: Secondary | ICD-10-CM | POA: Diagnosis not present

## 2021-11-14 DIAGNOSIS — I700 Atherosclerosis of aorta: Secondary | ICD-10-CM | POA: Insufficient documentation

## 2021-11-14 DIAGNOSIS — E039 Hypothyroidism, unspecified: Secondary | ICD-10-CM

## 2021-11-14 DIAGNOSIS — D61818 Other pancytopenia: Secondary | ICD-10-CM

## 2021-11-14 DIAGNOSIS — C539 Malignant neoplasm of cervix uteri, unspecified: Secondary | ICD-10-CM

## 2021-11-14 DIAGNOSIS — R64 Cachexia: Secondary | ICD-10-CM | POA: Diagnosis not present

## 2021-11-14 DIAGNOSIS — Z79899 Other long term (current) drug therapy: Secondary | ICD-10-CM | POA: Insufficient documentation

## 2021-11-14 LAB — CMP (CANCER CENTER ONLY)
ALT: 6 U/L (ref 0–44)
AST: 12 U/L — ABNORMAL LOW (ref 15–41)
Albumin: 4 g/dL (ref 3.5–5.0)
Alkaline Phosphatase: 60 U/L (ref 38–126)
Anion gap: 7 (ref 5–15)
BUN: 20 mg/dL (ref 8–23)
CO2: 28 mmol/L (ref 22–32)
Calcium: 9.3 mg/dL (ref 8.9–10.3)
Chloride: 105 mmol/L (ref 98–111)
Creatinine: 1.18 mg/dL — ABNORMAL HIGH (ref 0.44–1.00)
GFR, Estimated: 50 mL/min — ABNORMAL LOW (ref >60)
Glucose, Bld: 93 mg/dL (ref 70–99)
Potassium: 4.2 mmol/L (ref 3.5–5.1)
Sodium: 140 mmol/L (ref 135–145)
Total Bilirubin: 0.2 mg/dL — ABNORMAL LOW (ref 0.3–1.2)
Total Protein: 7 g/dL (ref 6.5–8.1)

## 2021-11-14 LAB — CBC WITH DIFFERENTIAL (CANCER CENTER ONLY)
Abs Immature Granulocytes: 0.04 10*3/uL (ref 0.00–0.07)
Basophils Absolute: 0 10*3/uL (ref 0.0–0.1)
Basophils Relative: 1 %
Eosinophils Absolute: 0.1 10*3/uL (ref 0.0–0.5)
Eosinophils Relative: 1 %
HCT: 30.3 % — ABNORMAL LOW (ref 36.0–46.0)
Hemoglobin: 9.7 g/dL — ABNORMAL LOW (ref 12.0–15.0)
Immature Granulocytes: 1 %
Lymphocytes Relative: 18 %
Lymphs Abs: 0.8 10*3/uL (ref 0.7–4.0)
MCH: 31.9 pg (ref 26.0–34.0)
MCHC: 32 g/dL (ref 30.0–36.0)
MCV: 99.7 fL (ref 80.0–100.0)
Monocytes Absolute: 0.4 10*3/uL (ref 0.1–1.0)
Monocytes Relative: 8 %
Neutro Abs: 3.2 10*3/uL (ref 1.7–7.7)
Neutrophils Relative %: 71 %
Platelet Count: 193 10*3/uL (ref 150–400)
RBC: 3.04 MIL/uL — ABNORMAL LOW (ref 3.87–5.11)
RDW: 13 % (ref 11.5–15.5)
WBC Count: 4.5 10*3/uL (ref 4.0–10.5)
nRBC: 0 % (ref 0.0–0.2)

## 2021-11-14 LAB — VITAMIN B12: Vitamin B-12: 215 pg/mL (ref 180–914)

## 2021-11-14 LAB — TSH: TSH: 4.061 u[IU]/mL — ABNORMAL HIGH (ref 0.308–3.960)

## 2021-11-14 MED ORDER — CYANOCOBALAMIN 1000 MCG/ML IJ SOLN
1000.0000 ug | Freq: Once | INTRAMUSCULAR | Status: AC
  Administered 2021-11-14: 1000 ug via INTRAMUSCULAR
  Filled 2021-11-14: qty 1

## 2021-11-14 MED ORDER — SODIUM CHLORIDE 0.9 % IV SOLN
10.0000 mg | Freq: Once | INTRAVENOUS | Status: AC
  Administered 2021-11-14: 10 mg via INTRAVENOUS
  Filled 2021-11-14: qty 10

## 2021-11-14 MED ORDER — SODIUM CHLORIDE 0.9% FLUSH
10.0000 mL | Freq: Once | INTRAVENOUS | Status: AC
  Administered 2021-11-14: 10 mL

## 2021-11-14 MED ORDER — SODIUM CHLORIDE 0.9% FLUSH
10.0000 mL | INTRAVENOUS | Status: DC | PRN
  Administered 2021-11-14: 10 mL

## 2021-11-14 MED ORDER — SODIUM CHLORIDE 0.9 % IV SOLN: Freq: Once | INTRAVENOUS | Status: AC

## 2021-11-14 MED ORDER — HEPARIN SOD (PORK) LOCK FLUSH 100 UNIT/ML IV SOLN
500.0000 [IU] | Freq: Once | INTRAVENOUS | Status: AC | PRN
  Administered 2021-11-14: 500 [IU]

## 2021-11-14 MED ORDER — DIPHENHYDRAMINE HCL 50 MG/ML IJ SOLN
12.5000 mg | Freq: Once | INTRAMUSCULAR | Status: AC
  Administered 2021-11-14: 12.5 mg via INTRAVENOUS
  Filled 2021-11-14: qty 1

## 2021-11-14 MED ORDER — SODIUM CHLORIDE 0.9 % IV SOLN
80.0000 mg/m2 | Freq: Once | INTRAVENOUS | Status: AC
  Administered 2021-11-14: 108 mg via INTRAVENOUS
  Filled 2021-11-14: qty 18

## 2021-11-14 MED ORDER — FAMOTIDINE IN NACL 20-0.9 MG/50ML-% IV SOLN
20.0000 mg | Freq: Once | INTRAVENOUS | Status: AC
  Administered 2021-11-14: 20 mg via INTRAVENOUS
  Filled 2021-11-14: qty 50

## 2021-11-14 NOTE — Assessment & Plan Note (Signed)
She has acquired hypothyroidism due to checkpoint inhibitors ?I refilled her Synthroid today ?We will continue to monitor closely and adjust her medication as needed ?

## 2021-11-14 NOTE — Patient Instructions (Signed)
[facility]  Discharge Instructions: ?Thank you for choosing [facility] to provide your oncology and hematology care.  ? ?If you have a lab appointment with the [facility], please go directly to the [facility] and check in at the registration area. ?  ?Wear comfortable clothing and clothing appropriate for easy access to any Portacath or PICC line.  ? ?We strive to give you quality time with your provider. You may need to reschedule your appointment if you arrive late (15 or more minutes).  Arriving late affects you and other patients whose appointments are after yours.  Also, if you miss three or more appointments without notifying the office, you may be dismissed from the clinic at the provider?s discretion.    ?  ?For prescription refill requests, have your pharmacy contact our office and allow 72 hours for refills to be completed.   ? ?Today you received the following chemotherapy and/or immunotherapy agents: Taxol, vitamin B12    ?  ?To help prevent nausea and vomiting after your treatment, we encourage you to take your nausea medication as directed. ? ?BELOW ARE SYMPTOMS THAT SHOULD BE REPORTED IMMEDIATELY: ?*FEVER GREATER THAN 100.4 F (38 ?C) OR HIGHER ?*CHILLS OR SWEATING ?*NAUSEA AND VOMITING THAT IS NOT CONTROLLED WITH YOUR NAUSEA MEDICATION ?*UNUSUAL SHORTNESS OF BREATH ?*UNUSUAL BRUISING OR BLEEDING ?*URINARY PROBLEMS (pain or burning when urinating, or frequent urination) ?*BOWEL PROBLEMS (unusual diarrhea, constipation, pain near the anus) ?TENDERNESS IN MOUTH AND THROAT WITH OR WITHOUT PRESENCE OF ULCERS (sore throat, sores in mouth, or a toothache) ?UNUSUAL RASH, SWELLING OR PAIN  ?UNUSUAL VAGINAL DISCHARGE OR ITCHING  ? ?Items with * indicate a potential emergency and should be followed up as soon as possible or go to the Emergency Department if any problems should occur. ? ?Please show the CHEMOTHERAPY ALERT CARD or IMMUNOTHERAPY ALERT CARD at  check-in to the Emergency Department and triage nurse. ? ?Should you have questions after your visit or need to cancel or reschedule your appointment, please contact [facility]  Dept: [phone]  and follow the prompts.  Office hours are 8:00 a.m. to 4:30 p.m. Monday - Friday. Please note that voicemails left after 4:00 p.m. may not be returned until the following business day.  We are closed weekends and major holidays. You have access to a nurse at all times for urgent questions. Please call the main number to the clinic Dept: [phone] and follow the prompts. ? ? ?For any non-urgent questions, you may also contact your provider using MyChart. We now offer e-Visits for anyone 57 and older to request care online for non-urgent symptoms. For details visit [email or web address]. ?  ?Also download the MyChart app! Go to the app store, search "MyChart", open the app, select [facility], and log in with your MyChart username and password. ? ?Due to Covid, a mask is required upon entering the hospital/clinic. If you do not have a mask, one will be given to you upon arrival. For doctor visits, patients may have 1 support person aged 10 or older with them. For treatment visits, patients cannot have anyone with them due to current Covid guidelines and our immunocompromised population.  ? ?

## 2021-11-14 NOTE — Assessment & Plan Note (Signed)
So far, she tolerated single agent Taxol without difficulties ?Her blood count looks stable ?I recommend we modify her treatment to weekly x3 weeks and then rest 1 week ?We will monitor her blood counts closely ?I plan to repeat imaging study after 3 months of treatment, due around May ?

## 2021-11-14 NOTE — Assessment & Plan Note (Signed)
We will continue B12 injections ?I plan to monitor her B12 closely, repeat level is pending today ?

## 2021-11-14 NOTE — Progress Notes (Signed)
[facility] OFFICE PROGRESS NOTE  Patient Care Team: Default, Provider, MD as PCP - General  ASSESSMENT & PLAN:  Malignant neoplasm of cervix ([place]) So far, Erin Kent tolerated single agent Taxol without difficulties Her blood count looks stable I recommend we modify her treatment to weekly x3 weeks and then rest 1 week We will monitor her blood counts closely I plan to repeat imaging study after 3 months of treatment, due around May  Vitamin B12 deficiency We will continue B12 injections I plan to monitor her B12 closely, repeat level is pending today  Acquired hypothyroidism Erin Kent has acquired hypothyroidism due to checkpoint inhibitors I refilled her Synthroid today We will continue to monitor closely and adjust her medication as needed  No orders of the defined types were placed in this encounter.   All questions were answered. The patient knows to call the clinic with any problems, questions or concerns. The total time spent in the appointment was 20 minutes encounter with patients including review of chart and various tests results, discussions about plan of care and coordination of care plan   [name], MD 11/14/2021 5:04 PM  INTERVAL HISTORY: Please see below for problem oriented charting. Erin Kent returns for treatment follow-up on single agent Taxol Erin Kent denies neuropathy, pelvic pain, bleeding or changes in bowel habits  REVIEW OF SYSTEMS:   Constitutional: Denies fevers, chills or abnormal weight loss Eyes: Denies blurriness of vision Ears, nose, mouth, throat, and face: Denies mucositis or sore throat Respiratory: Denies cough, dyspnea or wheezes Cardiovascular: Denies palpitation, chest discomfort or lower extremity swelling Gastrointestinal:  Denies nausea, heartburn or change in bowel habits Skin: Denies abnormal skin rashes Lymphatics: Denies new lymphadenopathy or easy bruising Neurological:Denies numbness, tingling or new weaknesses Behavioral/Psych:  Mood is stable, no new changes  All other systems were reviewed with the patient and are negative.  I have reviewed the past medical history, past surgical history, social history and family history with the patient and they are unchanged from previous note.  ALLERGIES:  has No Known Allergies.  MEDICATIONS:  Current Outpatient Medications  Medication Sig Dispense Refill   levothyroxine (SYNTHROID) 100 MCG tablet TAKE 1 TABLET(100 MCG) BY MOUTH DAILY BEFORE BREAKFAST 30 tablet 0   lidocaine-prilocaine (EMLA) cream Apply to affected area once 30 g 3   Multiple Vitamin (MULTIVITAMIN) tablet Take 1 tablet by mouth daily.     ondansetron (ZOFRAN) 8 MG tablet Take 1 tablet (8 mg total) by mouth 2 (two) times daily as needed (Nausea or vomiting). 30 tablet 1   prochlorperazine (COMPAZINE) 10 MG tablet Take 1 tablet (10 mg total) by mouth every 6 (six) hours as needed (Nausea or vomiting). 30 tablet 1   No current facility-administered medications for this visit.   Facility-Administered Medications Ordered in Other Visits  Medication Dose Route Frequency Provider Last Rate Last Admin   sodium chloride flush (NS) 0.9 % injection 10 mL  10 mL Intracatheter PRN Alvy Bimler, , MD   10 mL at 11/14/21 1549    SUMMARY OF ONCOLOGIC HISTORY: Oncology History Overview Note  Cancer Staging Malignant neoplasm of cervix ([place]) Staging form: Cervix Uteri, AJCC 8th Edition - Clinical: Stage IIIB (cT3b, cN1, cM0) - Signed by [name], MD on 05/04/2018  PD-L1 1% Progressed on carboplatin, pembrolizumab and cisplatin   Malignant neoplasm of cervix ([place])  03/13/2018 Imaging   US pelvis There are mobile echogenic foci within the endometrial cavity suggestive of gas. This is nonspecific in etiology however may  be secondary to endometritis. Correlate for history of recent instrumentation. This obscures visualization of the endometrial tissue and therefore a follow-up pelvic ultrasound in 2-4 weeks is  recommended after resolution of the acute symptomatology if symptoms persist to further evaluate the endometrium.     03/13/2018 Initial Diagnosis   Erin Kent initially presented with PMB   04/12/2018 Pathology Results   Cervix, biopsy, mass - INVASIVE SQUAMOUS CELL CARCINOMA - SEE COMMENT   04/12/2018 Surgery   PREOPERATIVE DIAGNOSIS:  Postmenopausal vaginal bleeding. POSTOPERATIVE DIAGNOSIS: The same PROCEDURE: Exam under anesthesia, pap smear, cervical mass biopsy SURGEON:  Dr. [name]   INDICATIONS: 71 y.o. yo [id number] with PMB here for exam under anesthesia.Risks of surgery were discussed with the patient including but not limited to: bleeding which may require transfusion; infection which may require antibiotics; injury to uterus or surrounding organs; need for additional procedures including laparotomy or laparoscopy; and other postoperative/anesthesia complications. Written informed consent was obtained.     FINDINGS:  An 8-week size midline uterus.  No adnexal mass palpable on exam. Normal cervix not visualized. Friable mass seen in involving the vagina at the level of the cervix obliterating the anterior and posterior fornix.   ANESTHESIA:   General INTRAVENOUS FLUIDS:  300 ml of LR ESTIMATED BLOOD LOSS: 20 ml. SPECIMENS: pap smear, cervical mass biopsy COMPLICATIONS:  None immediate.     04/25/2018 PET scan   1. Large cervical mass may invade into the myometrium in down into the vagina, maximum SUV 21.3. There is a malignant left external iliac node measuring 1.4 cm in short axis with maximum SUV 14.1. 2. Accentuated activity in the cecum and ascending colon is likely physiologic given that it has no CT correlate. Correlation with the patient's colon cancer screening history is recommended. If screening is not up-to-date, appropriate screening should be considered. 3.  Aortic Atherosclerosis (ICD10-I70.0).     04/28/2018 Surgery   Pre-operative Diagnosis:  At least Stage 2B  SCCa Cervical Cancer   Post-operative Diagnosis: At least clinical Stage 3A SCCa Cervical Cancer   Operation:  Exam under anesthesia due to intolerance for vaginal exam in office Cystoscopy due to concern for extension to posterior bladder (from anterior vaginal wall lesion)   Operative Findings:   Parametrial involvement on right  Right sided subvaginal tumor burden ~3cm. This approximates the right ureteral insertion into the bladder based on my exam during cystoscopy. Extension of disease down upper half of vagina on lateral and posterior walls. Extension of disease down to lower third of anterior vagina (versus skip lesion) thus making her Stage 3A Cystoscopy revealed patent bilateral ureteral orifices and no bladder mucosa invasion or areas of concern. Rectal exam grossly no disease.     05/04/2018 Cancer Staging   Staging form: Cervix Uteri, AJCC 8th Edition - Clinical: Stage IIIB (cT3b, cN1, cM0) - Signed by [name], MD on 05/04/2018    05/12/2018 Procedure   Successful 8 French right internal jugular vein power port placement with its tip at the SVC/RA junction   05/13/2018 - 06/24/2018 Chemotherapy   The patient had weekly cisplatin given with concurrent radiation    05/17/2018 - 07/27/2018 Radiation Therapy   Radiation treatment dates:   05/17/2018-07/27/2018   Site/dose: 1. Cervix, 1.8 Gy in 25 fractions for a total dose of 45 Gy                    2. Pelvis  Boost, 1.8 Gy in 5 fractions for a total  dose of 9 Gy                    3. Cervix, 5.5 Gy in 5 fractions for a total dose of 27.5 Gy   06/03/2018 Adverse Reaction   Erin Kent missed her chemo due to severe depression and was sent to the ER for suicidal ideation.  Erin Kent was seen by psychiatrist.   10/27/2018 PET scan   IMPRESSION: 1. Complete metabolic response to therapy, without residual or recurrent hypermetabolic disease. 2.  Aortic Atherosclerosis (ICD10-I70.0).     11/17/2018 Imaging   Successful right IJ vein  Port-A-Cath explant.   03/29/2020 PET scan   1. Hypermetabolic lesion along the right vaginal cuff is highly worrisome for recurrent cervical cancer. No evidence of distant metastatic disease. 2.  Aortic atherosclerosis (ICD10-I70.0).   04/26/2020 - 12/10/2020 Chemotherapy   The patient had CARBOplatin  for chemotherapy treatment.      Genetic Testing   Patient has genetic testing done for PD-L1. Results revealed patient has the following:  PD-L1 combined positive score (CPS): 1%   07/08/2020 Imaging   1. Unchanged post treatment appearance of the pelvis, including a soft tissue nodule in the low right hemipelvis adjacent to the vagina measuring 2.8 x 1.7 cm, previously FDG avid and consistent with malignancy. 2. No evidence of nodal or distant metastatic disease in the abdomen or pelvis. 3. Aortic Atherosclerosis (ICD10-I70.0).   10/14/2020 Imaging   1. Unchanged right eccentric soft tissue mass in the low right hemipelvis centered on the cervix and vagina, measuring 2.9 x 1.9 cm. 2. New small volume fluid within the endometrial cavity, abnormal in the postmenopausal setting and suggesting obstruction of the cervical os by soft tissue mass. 3. No evidence of lymphadenopathy or distant metastatic disease in the abdomen or pelvis.   Aortic Atherosclerosis (ICD10-I70.0).   12/24/2020 Imaging   1. Abnormal hypodense masslike appearance continuous with hypodense masslike lesion probably centrally within a right eccentric cervix (versus right paracervical mass). The endometrial portion has enlarged compared to 10/14/2020 and active malignancy is suspected. 2. No adenopathy or findings of distant spread. 3. Other imaging findings of potential clinical significance: Aortic Atherosclerosis (ICD10-I70.0). Mild cardiomegaly. Stable scarring in the left lower lobe. Sigmoid colon scattered diverticula. Tarlov cysts at the S2 level. Vertical sclerosis in the sacral ala possibly due to stress fractures  or prior radiation therapy.     01/06/2021 - 06/19/2021 Chemotherapy   Patient is on Treatment Plan : UTERINE Pembrolizumab q21d      04/07/2021 Imaging   1. The previously hypermetabolic right vaginal mass currently measures 12 cubic cm in volume, previously 11 cubic cm on 12/23/2020. 2. No new adenopathy or new findings of distant metastatic spread. 3. New wall thickening in several loops of distal ileum suspicious for inflammation/enteritis. 4. Other imaging findings of potential clinical significance: Mild cardiomegaly. Mild sigmoid colon diverticulosis. Stable bony demineralization with vertical sclerosis in the sacral ala likely from old insufficiency fracture.   07/10/2021 Imaging   1. New moderate right-sided hydroureteronephrosis with decreased perfusion to the right kidney compatible with obstructive uropathy.  2. Interval progression of the heterogeneous low-density uterine lesion, with progression of enhancing soft tissue extending inferiorly to the right, compatible with previously described disease in the right vaginal wall. Represents the site of right ureteral obstruction. 3. No evidence for new metastatic disease in the abdomen or pelvis. 4. Bilateral sacral insufficiency fractures. 5. Trace free fluid in the pelvis. 6. Aortic Atherosclerosis (ICD10-I70.0).  07/22/2021 Procedure   Successful placement of a right internal jugular approach power injectable Port-A-Cath.     07/24/2021 - 10/02/2021 Chemotherapy   Patient is on Treatment Plan : cervical cancer Cisplatin q7d       10/16/2021 Imaging   1. Mild interval increase in size of heterogeneous tumor arising from within the uterus with local tumor extension into the right pelvic sidewall. 2. Persistent right-sided obstructive uropathy with severe right hydronephrosis and progressive right renal cortical volume loss. Right hydroureter extends into the pelvis where it is obstructed by the uterine/cervical mass. 3.  Bilateral sacral insufficiency fractures. 4. Aortic Atherosclerosis (ICD10-I70.0).   11/06/2021 -  Chemotherapy   Patient is on Treatment Plan : Cervical Paclitaxel q7d       PHYSICAL EXAMINATION: ECOG PERFORMANCE STATUS: 0 - Asymptomatic  Vitals:   11/14/21 1232  BP: (!) 155/68  Pulse: 83  Resp: 18  Temp: 98.8 F (37.1 C)  SpO2: 100%   Filed Weights   11/14/21 1232  Weight: 89 lb 3.2 oz (40.5 kg)    GENERAL:alert, no distress and comfortable  NEURO: alert & oriented x 3 with fluent speech, no focal motor/sensory deficits  LABORATORY DATA:  I have reviewed the data as listed    Component Value Date/Time   NA 140 11/14/2021 1225   K 4.2 11/14/2021 1225   CL 105 11/14/2021 1225   CO2 28 11/14/2021 1225   GLUCOSE 93 11/14/2021 1225   BUN 20 11/14/2021 1225   CREATININE 1.18 (H) 11/14/2021 1225   CALCIUM 9.3 11/14/2021 1225   PROT 7.0 11/14/2021 1225   ALBUMIN 4.0 11/14/2021 1225   AST 12 (L) 11/14/2021 1225   ALT 6 11/14/2021 1225   ALKPHOS 60 11/14/2021 1225   BILITOT 0.2 (L) 11/14/2021 1225   GFRNONAA 50 (L) 11/14/2021 1225   GFRAA >60 06/14/2020 0844   GFRAA >60 03/11/2020 0951    No results found for: SPEP, UPEP  Lab Results  Component Value Date   WBC 4.5 11/14/2021   NEUTROABS 3.2 11/14/2021   HGB 9.7 (L) 11/14/2021   HCT 30.3 (L) 11/14/2021   MCV 99.7 11/14/2021   PLT 193 11/14/2021      Chemistry      Component Value Date/Time   NA 140 11/14/2021 1225   K 4.2 11/14/2021 1225   CL 105 11/14/2021 1225   CO2 28 11/14/2021 1225   BUN 20 11/14/2021 1225   CREATININE 1.18 (H) 11/14/2021 1225      Component Value Date/Time   CALCIUM 9.3 11/14/2021 1225   ALKPHOS 60 11/14/2021 1225   AST 12 (L) 11/14/2021 1225   ALT 6 11/14/2021 1225   BILITOT 0.2 (L) 11/14/2021 1225

## 2021-11-20 MED FILL — Dexamethasone Sodium Phosphate Inj 100 MG/10ML: INTRAMUSCULAR | Qty: 1 | Status: AC

## 2021-11-21 ENCOUNTER — Inpatient Hospital Stay: Payer: Medicare Other

## 2021-11-21 ENCOUNTER — Other Ambulatory Visit: Payer: Self-pay

## 2021-11-21 VITALS — BP 143/74 | HR 76 | Temp 98.8°F | Resp 17

## 2021-11-21 DIAGNOSIS — Z5111 Encounter for antineoplastic chemotherapy: Secondary | ICD-10-CM | POA: Diagnosis not present

## 2021-11-21 DIAGNOSIS — Z7189 Other specified counseling: Secondary | ICD-10-CM

## 2021-11-21 DIAGNOSIS — C539 Malignant neoplasm of cervix uteri, unspecified: Secondary | ICD-10-CM

## 2021-11-21 DIAGNOSIS — D61818 Other pancytopenia: Secondary | ICD-10-CM

## 2021-11-21 DIAGNOSIS — E538 Deficiency of other specified B group vitamins: Secondary | ICD-10-CM

## 2021-11-21 LAB — CMP (CANCER CENTER ONLY)
ALT: 8 U/L (ref 0–44)
AST: 14 U/L — ABNORMAL LOW (ref 15–41)
Albumin: 4 g/dL (ref 3.5–5.0)
Alkaline Phosphatase: 57 U/L (ref 38–126)
Anion gap: 5 (ref 5–15)
BUN: 20 mg/dL (ref 8–23)
CO2: 29 mmol/L (ref 22–32)
Calcium: 8.9 mg/dL (ref 8.9–10.3)
Chloride: 106 mmol/L (ref 98–111)
Creatinine: 1.1 mg/dL — ABNORMAL HIGH (ref 0.44–1.00)
GFR, Estimated: 54 mL/min — ABNORMAL LOW (ref >60)
Glucose, Bld: 100 mg/dL — ABNORMAL HIGH (ref 70–99)
Potassium: 4.1 mmol/L (ref 3.5–5.1)
Sodium: 140 mmol/L (ref 135–145)
Total Bilirubin: 0.2 mg/dL — ABNORMAL LOW (ref 0.3–1.2)
Total Protein: 6.7 g/dL (ref 6.5–8.1)

## 2021-11-21 LAB — CBC WITH DIFFERENTIAL (CANCER CENTER ONLY)
Abs Immature Granulocytes: 0.03 10*3/uL (ref 0.00–0.07)
Basophils Absolute: 0 10*3/uL (ref 0.0–0.1)
Basophils Relative: 1 %
Eosinophils Absolute: 0.1 10*3/uL (ref 0.0–0.5)
Eosinophils Relative: 2 %
HCT: 29.4 % — ABNORMAL LOW (ref 36.0–46.0)
Hemoglobin: 9.4 g/dL — ABNORMAL LOW (ref 12.0–15.0)
Immature Granulocytes: 1 %
Lymphocytes Relative: 20 %
Lymphs Abs: 0.7 10*3/uL (ref 0.7–4.0)
MCH: 31.8 pg (ref 26.0–34.0)
MCHC: 32 g/dL (ref 30.0–36.0)
MCV: 99.3 fL (ref 80.0–100.0)
Monocytes Absolute: 0.5 10*3/uL (ref 0.1–1.0)
Monocytes Relative: 13 %
Neutro Abs: 2.3 10*3/uL (ref 1.7–7.7)
Neutrophils Relative %: 63 %
Platelet Count: 210 10*3/uL (ref 150–400)
RBC: 2.96 MIL/uL — ABNORMAL LOW (ref 3.87–5.11)
RDW: 13.1 % (ref 11.5–15.5)
WBC Count: 3.6 10*3/uL — ABNORMAL LOW (ref 4.0–10.5)
nRBC: 0 % (ref 0.0–0.2)

## 2021-11-21 MED ORDER — FAMOTIDINE IN NACL 20-0.9 MG/50ML-% IV SOLN
20.0000 mg | Freq: Once | INTRAVENOUS | Status: AC
  Administered 2021-11-21: 20 mg via INTRAVENOUS
  Filled 2021-11-21: qty 50

## 2021-11-21 MED ORDER — SODIUM CHLORIDE 0.9% FLUSH
10.0000 mL | Freq: Once | INTRAVENOUS | Status: AC
  Administered 2021-11-21: 10 mL

## 2021-11-21 MED ORDER — DIPHENHYDRAMINE HCL 50 MG/ML IJ SOLN
12.5000 mg | Freq: Once | INTRAMUSCULAR | Status: AC
  Administered 2021-11-21: 12.5 mg via INTRAVENOUS
  Filled 2021-11-21: qty 1

## 2021-11-21 MED ORDER — SODIUM CHLORIDE 0.9% FLUSH
10.0000 mL | INTRAVENOUS | Status: DC | PRN
  Administered 2021-11-21: 10 mL

## 2021-11-21 MED ORDER — SODIUM CHLORIDE 0.9 % IV SOLN: Freq: Once | INTRAVENOUS | Status: AC

## 2021-11-21 MED ORDER — HEPARIN SOD (PORK) LOCK FLUSH 100 UNIT/ML IV SOLN
500.0000 [IU] | Freq: Once | INTRAVENOUS | Status: AC | PRN
  Administered 2021-11-21: 500 [IU]

## 2021-11-21 MED ORDER — SODIUM CHLORIDE 0.9 % IV SOLN
10.0000 mg | Freq: Once | INTRAVENOUS | Status: AC
  Administered 2021-11-21: 10 mg via INTRAVENOUS
  Filled 2021-11-21: qty 10

## 2021-11-21 MED ORDER — SODIUM CHLORIDE 0.9 % IV SOLN
80.0000 mg/m2 | Freq: Once | INTRAVENOUS | Status: AC
  Administered 2021-11-21: 108 mg via INTRAVENOUS
  Filled 2021-11-21: qty 18

## 2021-11-21 NOTE — Patient Instructions (Signed)
[facility]  Discharge Instructions: ?Thank you for choosing [facility] to provide your oncology and hematology care.  ? ?If you have a lab appointment with the [facility], please go directly to the [facility] and check in at the registration area. ?  ?Wear comfortable clothing and clothing appropriate for easy access to any Portacath or PICC line.  ? ?We strive to give you quality time with your provider. You may need to reschedule your appointment if you arrive late (15 or more minutes).  Arriving late affects you and other patients whose appointments are after yours.  Also, if you miss three or more appointments without notifying the office, you may be dismissed from the clinic at the provider?s discretion.    ?  ?For prescription refill requests, have your pharmacy contact our office and allow 72 hours for refills to be completed.   ? ?Today you received the following chemotherapy and/or immunotherapy agents : Taxol    ?  ?To help prevent nausea and vomiting after your treatment, we encourage you to take your nausea medication as directed. ? ?BELOW ARE SYMPTOMS THAT SHOULD BE REPORTED IMMEDIATELY: ?*FEVER GREATER THAN 100.4 F (38 ?C) OR HIGHER ?*CHILLS OR SWEATING ?*NAUSEA AND VOMITING THAT IS NOT CONTROLLED WITH YOUR NAUSEA MEDICATION ?*UNUSUAL SHORTNESS OF BREATH ?*UNUSUAL BRUISING OR BLEEDING ?*URINARY PROBLEMS (pain or burning when urinating, or frequent urination) ?*BOWEL PROBLEMS (unusual diarrhea, constipation, pain near the anus) ?TENDERNESS IN MOUTH AND THROAT WITH OR WITHOUT PRESENCE OF ULCERS (sore throat, sores in mouth, or a toothache) ?UNUSUAL RASH, SWELLING OR PAIN  ?UNUSUAL VAGINAL DISCHARGE OR ITCHING  ? ?Items with * indicate a potential emergency and should be followed up as soon as possible or go to the Emergency Department if any problems should occur. ? ?Please show the CHEMOTHERAPY ALERT CARD or IMMUNOTHERAPY ALERT CARD at check-in to the  Emergency Department and triage nurse. ? ?Should you have questions after your visit or need to cancel or reschedule your appointment, please contact [facility]  Dept: [phone]  and follow the prompts.  Office hours are 8:00 a.m. to 4:30 p.m. Monday - Friday. Please note that voicemails left after 4:00 p.m. may not be returned until the following business day.  We are closed weekends and major holidays. You have access to a nurse at all times for urgent questions. Please call the main number to the clinic Dept: [phone] and follow the prompts. ? ? ?For any non-urgent questions, you may also contact your provider using MyChart. We now offer e-Visits for anyone 74 and older to request care online for non-urgent symptoms. For details visit [email or web address]. ?  ?Also download the MyChart app! Go to the app store, search "MyChart", open the app, select [facility], and log in with your MyChart username and password. ? ?Due to Covid, a mask is required upon entering the hospital/clinic. If you do not have a mask, one will be given to you upon arrival. For doctor visits, patients may have 1 support person aged 64 or older with them. For treatment visits, patients cannot have anyone with them due to current Covid guidelines and our immunocompromised population.  ? ?

## 2021-12-05 ENCOUNTER — Inpatient Hospital Stay: Payer: Medicare Other

## 2021-12-05 ENCOUNTER — Encounter: Payer: Self-pay | Admitting: Hematology and Oncology

## 2021-12-05 ENCOUNTER — Inpatient Hospital Stay: Payer: Medicare Other | Admitting: Nutrition

## 2021-12-05 ENCOUNTER — Inpatient Hospital Stay (HOSPITAL_BASED_OUTPATIENT_CLINIC_OR_DEPARTMENT_OTHER): Payer: Medicare Other | Admitting: Hematology and Oncology

## 2021-12-05 ENCOUNTER — Other Ambulatory Visit: Payer: Self-pay

## 2021-12-05 VITALS — BP 128/80 | HR 72 | Temp 98.6°F | Resp 16 | Ht 62.0 in | Wt 88.8 lb

## 2021-12-05 DIAGNOSIS — E538 Deficiency of other specified B group vitamins: Secondary | ICD-10-CM

## 2021-12-05 DIAGNOSIS — C539 Malignant neoplasm of cervix uteri, unspecified: Secondary | ICD-10-CM

## 2021-12-05 DIAGNOSIS — R64 Cachexia: Secondary | ICD-10-CM

## 2021-12-05 DIAGNOSIS — Z5111 Encounter for antineoplastic chemotherapy: Secondary | ICD-10-CM | POA: Diagnosis not present

## 2021-12-05 DIAGNOSIS — Z7189 Other specified counseling: Secondary | ICD-10-CM

## 2021-12-05 DIAGNOSIS — D61818 Other pancytopenia: Secondary | ICD-10-CM

## 2021-12-05 LAB — CBC WITH DIFFERENTIAL (CANCER CENTER ONLY)
Abs Immature Granulocytes: 0.09 10*3/uL — ABNORMAL HIGH (ref 0.00–0.07)
Basophils Absolute: 0 10*3/uL (ref 0.0–0.1)
Basophils Relative: 1 %
Eosinophils Absolute: 0 10*3/uL (ref 0.0–0.5)
Eosinophils Relative: 1 %
HCT: 30.4 % — ABNORMAL LOW (ref 36.0–46.0)
Hemoglobin: 9.7 g/dL — ABNORMAL LOW (ref 12.0–15.0)
Immature Granulocytes: 2 %
Lymphocytes Relative: 16 %
Lymphs Abs: 0.9 10*3/uL (ref 0.7–4.0)
MCH: 31.4 pg (ref 26.0–34.0)
MCHC: 31.9 g/dL (ref 30.0–36.0)
MCV: 98.4 fL (ref 80.0–100.0)
Monocytes Absolute: 0.8 10*3/uL (ref 0.1–1.0)
Monocytes Relative: 15 %
Neutro Abs: 3.5 10*3/uL (ref 1.7–7.7)
Neutrophils Relative %: 65 %
Platelet Count: 211 10*3/uL (ref 150–400)
RBC: 3.09 MIL/uL — ABNORMAL LOW (ref 3.87–5.11)
RDW: 13.2 % (ref 11.5–15.5)
WBC Count: 5.3 10*3/uL (ref 4.0–10.5)
nRBC: 0 % (ref 0.0–0.2)

## 2021-12-05 LAB — CMP (CANCER CENTER ONLY)
ALT: 8 U/L (ref 0–44)
AST: 15 U/L (ref 15–41)
Albumin: 3.9 g/dL (ref 3.5–5.0)
Alkaline Phosphatase: 59 U/L (ref 38–126)
Anion gap: 6 (ref 5–15)
BUN: 20 mg/dL (ref 8–23)
CO2: 29 mmol/L (ref 22–32)
Calcium: 9.2 mg/dL (ref 8.9–10.3)
Chloride: 105 mmol/L (ref 98–111)
Creatinine: 1.23 mg/dL — ABNORMAL HIGH (ref 0.44–1.00)
GFR, Estimated: 48 mL/min — ABNORMAL LOW (ref >60)
Glucose, Bld: 85 mg/dL (ref 70–99)
Potassium: 4.3 mmol/L (ref 3.5–5.1)
Sodium: 140 mmol/L (ref 135–145)
Total Bilirubin: 0.3 mg/dL (ref 0.3–1.2)
Total Protein: 6.8 g/dL (ref 6.5–8.1)

## 2021-12-05 MED ORDER — HEPARIN SOD (PORK) LOCK FLUSH 100 UNIT/ML IV SOLN
500.0000 [IU] | Freq: Once | INTRAVENOUS | Status: AC | PRN
  Administered 2021-12-05: 500 [IU]

## 2021-12-05 MED ORDER — DIPHENHYDRAMINE HCL 50 MG/ML IJ SOLN
12.5000 mg | Freq: Once | INTRAMUSCULAR | Status: AC
  Administered 2021-12-05: 12.5 mg via INTRAVENOUS
  Filled 2021-12-05: qty 1

## 2021-12-05 MED ORDER — SODIUM CHLORIDE 0.9 % IV SOLN
80.0000 mg/m2 | Freq: Once | INTRAVENOUS | Status: AC
  Administered 2021-12-05: 108 mg via INTRAVENOUS
  Filled 2021-12-05: qty 18

## 2021-12-05 MED ORDER — SODIUM CHLORIDE 0.9% FLUSH
10.0000 mL | INTRAVENOUS | Status: DC | PRN
  Administered 2021-12-05: 10 mL

## 2021-12-05 MED ORDER — SODIUM CHLORIDE 0.9% FLUSH
10.0000 mL | Freq: Once | INTRAVENOUS | Status: AC
  Administered 2021-12-05: 10 mL

## 2021-12-05 MED ORDER — SODIUM CHLORIDE 0.9 % IV SOLN: Freq: Once | INTRAVENOUS | Status: AC

## 2021-12-05 MED ORDER — FAMOTIDINE IN NACL 20-0.9 MG/50ML-% IV SOLN
20.0000 mg | Freq: Once | INTRAVENOUS | Status: AC
  Administered 2021-12-05: 20 mg via INTRAVENOUS
  Filled 2021-12-05: qty 50

## 2021-12-05 MED ORDER — SODIUM CHLORIDE 0.9 % IV SOLN
10.0000 mg | Freq: Once | INTRAVENOUS | Status: AC
  Administered 2021-12-05: 10 mg via INTRAVENOUS
  Filled 2021-12-05: qty 10

## 2021-12-05 NOTE — Patient Instructions (Signed)
[facility]  Discharge Instructions: ?Thank you for choosing [facility] to provide your oncology and hematology care.  ? ?If you have a lab appointment with the [facility], please go directly to the [facility] and check in at the registration area. ?  ?Wear comfortable clothing and clothing appropriate for easy access to any Portacath or PICC line.  ? ?We strive to give you quality time with your provider. You may need to reschedule your appointment if you arrive late (15 or more minutes).  Arriving late affects you and other patients whose appointments are after yours.  Also, if you miss three or more appointments without notifying the office, you may be dismissed from the clinic at the provider?s discretion.    ?  ?For prescription refill requests, have your pharmacy contact our office and allow 72 hours for refills to be completed.   ? ?Today you received the following chemotherapy and/or immunotherapy agents: Paclitaxel.    ?  ?To help prevent nausea and vomiting after your treatment, we encourage you to take your nausea medication as directed. ? ?BELOW ARE SYMPTOMS THAT SHOULD BE REPORTED IMMEDIATELY: ?*FEVER GREATER THAN 100.4 F (38 ?C) OR HIGHER ?*CHILLS OR SWEATING ?*NAUSEA AND VOMITING THAT IS NOT CONTROLLED WITH YOUR NAUSEA MEDICATION ?*UNUSUAL SHORTNESS OF BREATH ?*UNUSUAL BRUISING OR BLEEDING ?*URINARY PROBLEMS (pain or burning when urinating, or frequent urination) ?*BOWEL PROBLEMS (unusual diarrhea, constipation, pain near the anus) ?TENDERNESS IN MOUTH AND THROAT WITH OR WITHOUT PRESENCE OF ULCERS (sore throat, sores in mouth, or a toothache) ?UNUSUAL RASH, SWELLING OR PAIN  ?UNUSUAL VAGINAL DISCHARGE OR ITCHING  ? ?Items with * indicate a potential emergency and should be followed up as soon as possible or go to the Emergency Department if any problems should occur. ? ?Please show the CHEMOTHERAPY ALERT CARD or IMMUNOTHERAPY ALERT CARD at check-in  to the Emergency Department and triage nurse. ? ?Should you have questions after your visit or need to cancel or reschedule your appointment, please contact [facility]  Dept: [phone]  and follow the prompts.  Office hours are 8:00 a.m. to 4:30 p.m. Monday - Friday. Please note that voicemails left after 4:00 p.m. may not be returned until the following business day.  We are closed weekends and major holidays. You have access to a nurse at all times for urgent questions. Please call the main number to the clinic Dept: [phone] and follow the prompts. ? ? ?For any non-urgent questions, you may also contact your provider using MyChart. We now offer e-Visits for anyone 4 and older to request care online for non-urgent symptoms. For details visit [email or web address]. ?  ?Also download the MyChart app! Go to the app store, search "MyChart", open the app, select [facility], and log in with your MyChart username and password. ? ?Due to Covid, a mask is required upon entering the hospital/clinic. If you do not have a mask, one will be given to you upon arrival. For doctor visits, patients may have 1 support person aged 59 or older with them. For treatment visits, patients cannot have anyone with them due to current Covid guidelines and our immunocompromised population.  ? ?

## 2021-12-05 NOTE — Assessment & Plan Note (Signed)
She will continue dietitian review ?Her weight is stable ?

## 2021-12-05 NOTE — Progress Notes (Signed)
Brief nutrition follow-up completed with patient during infusion for cervical cancer. ?Weight documented is 88 pounds 3 ounces today down slightly from 89 pounds March 10. ?Patient denies nutrition impact symptoms.  She reports that nutrition supplements are an acquired taste.  She has consumed juice based nutrition supplements with better success.  She has good energy and has no questions or concerns at this time. ? ?Nutrition diagnosis: Food and nutrition related knowledge deficit resolved. ? ?Provided samples of juice based nutrition drinks and encourage patient to try to drink 1 daily.  Encouraged her to contact RD with any further questions or concerns.  Food and nutrition related knowledge deficit has resolved.  Please consult RD for further questions or concerns. ? ?**Disclaimer: This note was dictated with voice recognition software. Similar sounding words can inadvertently be transcribed and this note may contain transcription errors which may not have been corrected upon publication of note.** ? ?

## 2021-12-05 NOTE — Assessment & Plan Note (Signed)
We will continue B12 injections monthly I plan to monitor her B12 closely, repeat level is slightly better 

## 2021-12-05 NOTE — Progress Notes (Signed)
[facility] ?OFFICE PROGRESS NOTE ? ?Patient Care Team: ?Default, Provider, MD as PCP - General ? ?ASSESSMENT & PLAN:  ?Malignant neoplasm of cervix ([place]) ?So far, she tolerated single agent Taxol without difficulties ?Her blood count looks stable ?I recommend we modify her treatment to weekly x3 weeks and then rest 1 week ?We will monitor her blood counts closely ?I plan to repeat imaging study after 3 months of treatment, due around May ? ?Vitamin B12 deficiency ?We will continue B12 injections monthly ?I plan to monitor her B12 closely, repeat level is slightly better ? ?Cachexia ([place]) ?She will continue dietitian review ?Her weight is stable ? ?No orders of the defined types were placed in this encounter. ? ? ?All questions were answered. The patient knows to call the clinic with any problems, questions or concerns. ?The total time spent in the appointment was 25 minutes encounter with patients including review of chart and various tests results, discussions about plan of care and coordination of care plan ?  ?[name], MD ?12/05/2021 10:21 AM ? ?INTERVAL HISTORY: ?Please see below for problem oriented charting. ?she returns for treatment follow-up on Taxol ?She denies worsening peripheral neuropathy ?Denies pelvic pain or bleeding ?She has gained some weight ?No nausea or changes in bowel habits ? ?REVIEW OF SYSTEMS:   ?Constitutional: Denies fevers, chills or abnormal weight loss ?Eyes: Denies blurriness of vision ?Ears, nose, mouth, throat, and face: Denies mucositis or sore throat ?Respiratory: Denies cough, dyspnea or wheezes ?Cardiovascular: Denies palpitation, chest discomfort or lower extremity swelling ?Gastrointestinal:  Denies nausea, heartburn or change in bowel habits ?Skin: Denies abnormal skin rashes ?Lymphatics: Denies new lymphadenopathy or easy bruising ?Neurological:Denies numbness, tingling or new weaknesses ?Behavioral/Psych: Mood is stable, no new changes  ?All other systems  were reviewed with the patient and are negative. ? ?I have reviewed the past medical history, past surgical history, social history and family history with the patient and they are unchanged from previous note. ? ?ALLERGIES:  has No Known Allergies. ? ?MEDICATIONS:  ?Current Outpatient Medications  ?Medication Sig Dispense Refill  ? levothyroxine (SYNTHROID) 100 MCG tablet TAKE 1 TABLET(100 MCG) BY MOUTH DAILY BEFORE BREAKFAST 30 tablet 0  ? lidocaine-prilocaine (EMLA) cream Apply to affected area once 30 g 3  ? Multiple Vitamin (MULTIVITAMIN) tablet Take 1 tablet by mouth daily.    ? ondansetron (ZOFRAN) 8 MG tablet Take 1 tablet (8 mg total) by mouth 2 (two) times daily as needed (Nausea or vomiting). 30 tablet 1  ? prochlorperazine (COMPAZINE) 10 MG tablet Take 1 tablet (10 mg total) by mouth every 6 (six) hours as needed (Nausea or vomiting). 30 tablet 1  ? ?No current facility-administered medications for this visit.  ? ? ?SUMMARY OF ONCOLOGIC HISTORY: ?Oncology History Overview Note  ?Cancer Staging ?Malignant neoplasm of cervix ([place]) ?Staging form: Cervix Uteri, AJCC 8th Edition ?- Clinical: Stage IIIB (cT3b, cN1, cM0) - Signed by [name], MD on 05/04/2018 ? ?PD-L1 1% ?Progressed on carboplatin, pembrolizumab and cisplatin ?  ?Malignant neoplasm of cervix ([place])  ?03/13/2018 Imaging  ? US pelvis ?There are mobile echogenic foci within the endometrial cavity suggestive of gas. This is nonspecific in etiology however may be secondary to endometritis. Correlate for history of recent instrumentation. This obscures visualization of the endometrial tissue and therefore a follow-up pelvic ultrasound in 2-4 weeks is recommended after resolution of the acute symptomatology if symptoms persist to further evaluate the endometrium. ?  ?  ?03/13/2018 Initial Diagnosis  ?  She initially presented with PMB ?  ?04/12/2018 Pathology Results  ? Cervix, biopsy, mass ?- INVASIVE SQUAMOUS CELL CARCINOMA ?- SEE COMMENT ?  ?04/12/2018  Surgery  ? PREOPERATIVE DIAGNOSIS:  Postmenopausal vaginal bleeding. ?POSTOPERATIVE DIAGNOSIS: The same ?PROCEDURE: Exam under anesthesia, pap smear, cervical mass biopsy ?SURGEON:  Dr. [name] ?  ?INDICATIONS: 71 y.o. yo G0P0000 with PMB here for exam under anesthesia.Risks of surgery were discussed with the patient including but not limited to: bleeding which may require transfusion; infection which may require antibiotics; injury to uterus or surrounding organs; need for additional procedures including laparotomy or laparoscopy; and other postoperative/anesthesia complications. Written informed consent was obtained.   ?  ?FINDINGS:  An 8-week size midline uterus.  No adnexal mass palpable on exam. Normal cervix not visualized. Friable mass seen in involving the vagina at the level of the cervix obliterating the anterior and posterior fornix. ?  ?ANESTHESIA:   General ?INTRAVENOUS FLUIDS:  300 ml of LR ?ESTIMATED BLOOD LOSS: 20 ml. ?SPECIMENS: pap smear, cervical mass biopsy ?COMPLICATIONS:  None immediate. ?  ?  ?04/25/2018 PET scan  ? 1. Large cervical mass may invade into the myometrium in down into the vagina, maximum SUV 21.3. There is a malignant left external iliac node measuring 1.4 cm in short axis with maximum SUV 14.1. ?2. Accentuated activity in the cecum and ascending colon is likely physiologic given that it has no CT correlate. Correlation with the patient's colon cancer screening history is recommended. If screening is not up-to-date, appropriate screening should be considered. ?3.  Aortic Atherosclerosis (ICD10-I70.0). ?  ?  ?04/28/2018 Surgery  ? Pre-operative Diagnosis:  At least Stage 2B SCCa Cervical Cancer ?  ?Post-operative Diagnosis: At least clinical Stage 3A SCCa Cervical Cancer ?  ?Operation:  ?Exam under anesthesia due to intolerance for vaginal exam in office ?Cystoscopy due to concern for extension to posterior bladder (from anterior vaginal wall lesion) ?  ?Operative Findings:    ?Parametrial involvement on right  ?Right sided subvaginal tumor burden ~3cm. This approximates the right ureteral insertion into the bladder based on my exam during cystoscopy. ?Extension of disease down upper half of vagina on lateral and posterior walls. ?Extension of disease down to lower third of anterior vagina (versus skip lesion) thus making her Stage 3A ?Cystoscopy revealed patent bilateral ureteral orifices and no bladder mucosa invasion or areas of concern. ?Rectal exam grossly no disease. ?  ?  ?05/04/2018 Cancer Staging  ? Staging form: Cervix Uteri, AJCC 8th Edition ?- Clinical: Stage IIIB (cT3b, cN1, cM0) - Signed by [name], MD on 05/04/2018 ? ?  ?05/12/2018 Procedure  ? Successful 8 French right internal jugular vein power port placement with its tip at the SVC/RA junction ?  ?05/13/2018 - 06/24/2018 Chemotherapy  ? The patient had weekly cisplatin given with concurrent radiation ? ?  ?05/17/2018 - 07/27/2018 Radiation Therapy  ? Radiation treatment dates:   05/17/2018-07/27/2018 ?  ?Site/dose: 1. Cervix, 1.8 Gy in 25 fractions for a total dose of 45 Gy ?                   2. Pelvis  Boost, 1.8 Gy in 5 fractions for a total dose of 9 Gy ?                   3. Cervix, 5.5 Gy in 5 fractions for a total dose of 27.5 Gy ?  ?06/03/2018 Adverse Reaction  ? She missed her chemo due to severe  depression and was sent to the ER for suicidal ideation.  She was seen by psychiatrist. ?  ?10/27/2018 PET scan  ? IMPRESSION: ?1. Complete metabolic response to therapy, without residual or recurrent hypermetabolic disease. ?2.  Aortic Atherosclerosis (ICD10-I70.0). ?  ?  ?11/17/2018 Imaging  ? Successful right IJ vein Port-A-Cath explant. ?  ?03/29/2020 PET scan  ? 1. Hypermetabolic lesion along the right vaginal cuff is highly worrisome for recurrent cervical cancer. No evidence of distant metastatic disease. ?2.  Aortic atherosclerosis (ICD10-I70.0). ?  ?04/26/2020 - 12/10/2020 Chemotherapy  ? The patient had CARBOplatin   for chemotherapy treatment.  ? ?  ? Genetic Testing  ? Patient has genetic testing done for PD-L1. ?Results revealed patient has the following: ? ?PD-L1 combined positive score (CPS): 1% ?  ?07/08/2020

## 2021-12-05 NOTE — Assessment & Plan Note (Signed)
So far, she tolerated single agent Taxol without difficulties ?Her blood count looks stable ?I recommend we modify her treatment to weekly x3 weeks and then rest 1 week ?We will monitor her blood counts closely ?I plan to repeat imaging study after 3 months of treatment, due around May ?

## 2021-12-12 ENCOUNTER — Other Ambulatory Visit: Payer: Self-pay

## 2021-12-12 ENCOUNTER — Inpatient Hospital Stay: Payer: Medicare Other

## 2021-12-12 VITALS — BP 141/73 | HR 57 | Temp 98.0°F | Resp 18 | Wt 90.5 lb

## 2021-12-12 DIAGNOSIS — D61818 Other pancytopenia: Secondary | ICD-10-CM

## 2021-12-12 DIAGNOSIS — Z5111 Encounter for antineoplastic chemotherapy: Secondary | ICD-10-CM | POA: Diagnosis not present

## 2021-12-12 DIAGNOSIS — Z7189 Other specified counseling: Secondary | ICD-10-CM

## 2021-12-12 DIAGNOSIS — C539 Malignant neoplasm of cervix uteri, unspecified: Secondary | ICD-10-CM

## 2021-12-12 DIAGNOSIS — E538 Deficiency of other specified B group vitamins: Secondary | ICD-10-CM

## 2021-12-12 LAB — CMP (CANCER CENTER ONLY)
ALT: 10 U/L (ref 0–44)
AST: 14 U/L — ABNORMAL LOW (ref 15–41)
Albumin: 3.9 g/dL (ref 3.5–5.0)
Alkaline Phosphatase: 59 U/L (ref 38–126)
Anion gap: 6 (ref 5–15)
BUN: 18 mg/dL (ref 8–23)
CO2: 26 mmol/L (ref 22–32)
Calcium: 8.7 mg/dL — ABNORMAL LOW (ref 8.9–10.3)
Chloride: 108 mmol/L (ref 98–111)
Creatinine: 1.07 mg/dL — ABNORMAL HIGH (ref 0.44–1.00)
GFR, Estimated: 56 mL/min — ABNORMAL LOW (ref >60)
Glucose, Bld: 97 mg/dL (ref 70–99)
Potassium: 4.2 mmol/L (ref 3.5–5.1)
Sodium: 140 mmol/L (ref 135–145)
Total Bilirubin: 0.3 mg/dL (ref 0.3–1.2)
Total Protein: 6.9 g/dL (ref 6.5–8.1)

## 2021-12-12 LAB — CBC WITH DIFFERENTIAL (CANCER CENTER ONLY)
Abs Immature Granulocytes: 0.06 10*3/uL (ref 0.00–0.07)
Basophils Absolute: 0 10*3/uL (ref 0.0–0.1)
Basophils Relative: 0 %
Eosinophils Absolute: 0.1 10*3/uL (ref 0.0–0.5)
Eosinophils Relative: 1 %
HCT: 29.7 % — ABNORMAL LOW (ref 36.0–46.0)
Hemoglobin: 9.5 g/dL — ABNORMAL LOW (ref 12.0–15.0)
Immature Granulocytes: 1 %
Lymphocytes Relative: 16 %
Lymphs Abs: 0.8 10*3/uL (ref 0.7–4.0)
MCH: 31.3 pg (ref 26.0–34.0)
MCHC: 32 g/dL (ref 30.0–36.0)
MCV: 97.7 fL (ref 80.0–100.0)
Monocytes Absolute: 0.4 10*3/uL (ref 0.1–1.0)
Monocytes Relative: 8 %
Neutro Abs: 3.6 10*3/uL (ref 1.7–7.7)
Neutrophils Relative %: 74 %
Platelet Count: 198 10*3/uL (ref 150–400)
RBC: 3.04 MIL/uL — ABNORMAL LOW (ref 3.87–5.11)
RDW: 13.1 % (ref 11.5–15.5)
WBC Count: 5 10*3/uL (ref 4.0–10.5)
nRBC: 0 % (ref 0.0–0.2)

## 2021-12-12 MED ORDER — SODIUM CHLORIDE 0.9 % IV SOLN: Freq: Once | INTRAVENOUS | Status: AC

## 2021-12-12 MED ORDER — SODIUM CHLORIDE 0.9% FLUSH
10.0000 mL | Freq: Once | INTRAVENOUS | Status: AC
  Administered 2021-12-12: 10 mL

## 2021-12-12 MED ORDER — DIPHENHYDRAMINE HCL 50 MG/ML IJ SOLN
12.5000 mg | Freq: Once | INTRAMUSCULAR | Status: AC
  Administered 2021-12-12: 12.5 mg via INTRAVENOUS
  Filled 2021-12-12: qty 1

## 2021-12-12 MED ORDER — SODIUM CHLORIDE 0.9% FLUSH
10.0000 mL | INTRAVENOUS | Status: DC | PRN
  Administered 2021-12-12: 10 mL

## 2021-12-12 MED ORDER — SODIUM CHLORIDE 0.9 % IV SOLN
80.0000 mg/m2 | Freq: Once | INTRAVENOUS | Status: AC
  Administered 2021-12-12: 108 mg via INTRAVENOUS
  Filled 2021-12-12: qty 18

## 2021-12-12 MED ORDER — CYANOCOBALAMIN 1000 MCG/ML IJ SOLN
1000.0000 ug | Freq: Once | INTRAMUSCULAR | Status: AC
  Administered 2021-12-12: 1000 ug via INTRAMUSCULAR
  Filled 2021-12-12: qty 1

## 2021-12-12 MED ORDER — FAMOTIDINE IN NACL 20-0.9 MG/50ML-% IV SOLN
20.0000 mg | Freq: Once | INTRAVENOUS | Status: AC
  Administered 2021-12-12: 20 mg via INTRAVENOUS
  Filled 2021-12-12: qty 50

## 2021-12-12 MED ORDER — SODIUM CHLORIDE 0.9 % IV SOLN
10.0000 mg | Freq: Once | INTRAVENOUS | Status: AC
  Administered 2021-12-12: 10 mg via INTRAVENOUS
  Filled 2021-12-12: qty 10

## 2021-12-12 MED ORDER — HEPARIN SOD (PORK) LOCK FLUSH 100 UNIT/ML IV SOLN
500.0000 [IU] | Freq: Once | INTRAVENOUS | Status: AC | PRN
  Administered 2021-12-12: 500 [IU]

## 2021-12-12 NOTE — Patient Instructions (Signed)
[facility]  Discharge Instructions: ?Thank you for choosing [facility] to provide your oncology and hematology care.  ? ?If you have a lab appointment with the [facility], please go directly to the [facility] and check in at the registration area. ?  ?Wear comfortable clothing and clothing appropriate for easy access to any Portacath or PICC line.  ? ?We strive to give you quality time with your provider. You may need to reschedule your appointment if you arrive late (15 or more minutes).  Arriving late affects you and other patients whose appointments are after yours.  Also, if you miss three or more appointments without notifying the office, you may be dismissed from the clinic at the provider?s discretion.    ?  ?For prescription refill requests, have your pharmacy contact our office and allow 72 hours for refills to be completed.   ? ?Today you received the following chemotherapy and/or immunotherapy agents: Paclitaxel.    ?  ?To help prevent nausea and vomiting after your treatment, we encourage you to take your nausea medication as directed. ? ?BELOW ARE SYMPTOMS THAT SHOULD BE REPORTED IMMEDIATELY: ?*FEVER GREATER THAN 100.4 F (38 ?C) OR HIGHER ?*CHILLS OR SWEATING ?*NAUSEA AND VOMITING THAT IS NOT CONTROLLED WITH YOUR NAUSEA MEDICATION ?*UNUSUAL SHORTNESS OF BREATH ?*UNUSUAL BRUISING OR BLEEDING ?*URINARY PROBLEMS (pain or burning when urinating, or frequent urination) ?*BOWEL PROBLEMS (unusual diarrhea, constipation, pain near the anus) ?TENDERNESS IN MOUTH AND THROAT WITH OR WITHOUT PRESENCE OF ULCERS (sore throat, sores in mouth, or a toothache) ?UNUSUAL RASH, SWELLING OR PAIN  ?UNUSUAL VAGINAL DISCHARGE OR ITCHING  ? ?Items with * indicate a potential emergency and should be followed up as soon as possible or go to the Emergency Department if any problems should occur. ? ?Please show the CHEMOTHERAPY ALERT CARD or IMMUNOTHERAPY ALERT CARD at check-in  to the Emergency Department and triage nurse. ? ?Should you have questions after your visit or need to cancel or reschedule your appointment, please contact [facility]  Dept: [phone]  and follow the prompts.  Office hours are 8:00 a.m. to 4:30 p.m. Monday - Friday. Please note that voicemails left after 4:00 p.m. may not be returned until the following business day.  We are closed weekends and major holidays. You have access to a nurse at all times for urgent questions. Please call the main number to the clinic Dept: [phone] and follow the prompts. ? ? ?For any non-urgent questions, you may also contact your provider using MyChart. We now offer e-Visits for anyone 33 and older to request care online for non-urgent symptoms. For details visit [email or web address]. ?  ?Also download the MyChart app! Go to the app store, search "MyChart", open the app, select [facility], and log in with your MyChart username and password. ? ?Due to Covid, a mask is required upon entering the hospital/clinic. If you do not have a mask, one will be given to you upon arrival. For doctor visits, patients may have 1 support Erin Kent aged 70 or older with them. For treatment visits, patients cannot have anyone with them due to current Covid guidelines and our immunocompromised population.  ? ?

## 2021-12-18 ENCOUNTER — Other Ambulatory Visit: Payer: Self-pay | Admitting: Hematology and Oncology

## 2021-12-19 ENCOUNTER — Encounter: Payer: Self-pay | Admitting: Hematology and Oncology

## 2021-12-19 ENCOUNTER — Other Ambulatory Visit: Payer: Self-pay

## 2021-12-19 ENCOUNTER — Inpatient Hospital Stay (HOSPITAL_BASED_OUTPATIENT_CLINIC_OR_DEPARTMENT_OTHER): Payer: Medicare Other | Admitting: Hematology and Oncology

## 2021-12-19 ENCOUNTER — Inpatient Hospital Stay: Payer: Medicare Other

## 2021-12-19 ENCOUNTER — Inpatient Hospital Stay: Payer: Medicare Other | Attending: Hematology and Oncology

## 2021-12-19 ENCOUNTER — Telehealth: Payer: Self-pay | Admitting: Hematology and Oncology

## 2021-12-19 VITALS — BP 138/66 | HR 86 | Temp 98.8°F | Resp 18 | Ht 62.0 in | Wt 90.4 lb

## 2021-12-19 DIAGNOSIS — C539 Malignant neoplasm of cervix uteri, unspecified: Secondary | ICD-10-CM

## 2021-12-19 DIAGNOSIS — K5730 Diverticulosis of large intestine without perforation or abscess without bleeding: Secondary | ICD-10-CM | POA: Diagnosis not present

## 2021-12-19 DIAGNOSIS — D539 Nutritional anemia, unspecified: Secondary | ICD-10-CM

## 2021-12-19 DIAGNOSIS — E538 Deficiency of other specified B group vitamins: Secondary | ICD-10-CM

## 2021-12-19 DIAGNOSIS — G629 Polyneuropathy, unspecified: Secondary | ICD-10-CM | POA: Insufficient documentation

## 2021-12-19 DIAGNOSIS — I517 Cardiomegaly: Secondary | ICD-10-CM | POA: Diagnosis not present

## 2021-12-19 DIAGNOSIS — Z7189 Other specified counseling: Secondary | ICD-10-CM

## 2021-12-19 DIAGNOSIS — Z5111 Encounter for antineoplastic chemotherapy: Secondary | ICD-10-CM | POA: Diagnosis present

## 2021-12-19 DIAGNOSIS — E039 Hypothyroidism, unspecified: Secondary | ICD-10-CM

## 2021-12-19 DIAGNOSIS — D61818 Other pancytopenia: Secondary | ICD-10-CM

## 2021-12-19 DIAGNOSIS — I700 Atherosclerosis of aorta: Secondary | ICD-10-CM | POA: Insufficient documentation

## 2021-12-19 DIAGNOSIS — N1330 Unspecified hydronephrosis: Secondary | ICD-10-CM | POA: Insufficient documentation

## 2021-12-19 DIAGNOSIS — Z79899 Other long term (current) drug therapy: Secondary | ICD-10-CM | POA: Insufficient documentation

## 2021-12-19 DIAGNOSIS — Z79890 Hormone replacement therapy (postmenopausal): Secondary | ICD-10-CM | POA: Insufficient documentation

## 2021-12-19 LAB — CMP (CANCER CENTER ONLY)
ALT: 9 U/L (ref 0–44)
AST: 16 U/L (ref 15–41)
Albumin: 4 g/dL (ref 3.5–5.0)
Alkaline Phosphatase: 52 U/L (ref 38–126)
Anion gap: 6 (ref 5–15)
BUN: 20 mg/dL (ref 8–23)
CO2: 28 mmol/L (ref 22–32)
Calcium: 9.1 mg/dL (ref 8.9–10.3)
Chloride: 107 mmol/L (ref 98–111)
Creatinine: 1.12 mg/dL — ABNORMAL HIGH (ref 0.44–1.00)
GFR, Estimated: 53 mL/min — ABNORMAL LOW (ref >60)
Glucose, Bld: 94 mg/dL (ref 70–99)
Potassium: 3.9 mmol/L (ref 3.5–5.1)
Sodium: 141 mmol/L (ref 135–145)
Total Bilirubin: 0.3 mg/dL (ref 0.3–1.2)
Total Protein: 6.9 g/dL (ref 6.5–8.1)

## 2021-12-19 LAB — CBC WITH DIFFERENTIAL (CANCER CENTER ONLY)
Abs Immature Granulocytes: 0.05 10*3/uL (ref 0.00–0.07)
Basophils Absolute: 0 10*3/uL (ref 0.0–0.1)
Basophils Relative: 1 %
Eosinophils Absolute: 0 10*3/uL (ref 0.0–0.5)
Eosinophils Relative: 1 %
HCT: 29.8 % — ABNORMAL LOW (ref 36.0–46.0)
Hemoglobin: 9.6 g/dL — ABNORMAL LOW (ref 12.0–15.0)
Immature Granulocytes: 1 %
Lymphocytes Relative: 17 %
Lymphs Abs: 0.8 10*3/uL (ref 0.7–4.0)
MCH: 31.1 pg (ref 26.0–34.0)
MCHC: 32.2 g/dL (ref 30.0–36.0)
MCV: 96.4 fL (ref 80.0–100.0)
Monocytes Absolute: 0.4 10*3/uL (ref 0.1–1.0)
Monocytes Relative: 9 %
Neutro Abs: 3.3 10*3/uL (ref 1.7–7.7)
Neutrophils Relative %: 71 %
Platelet Count: 197 10*3/uL (ref 150–400)
RBC: 3.09 MIL/uL — ABNORMAL LOW (ref 3.87–5.11)
RDW: 13.3 % (ref 11.5–15.5)
WBC Count: 4.6 10*3/uL (ref 4.0–10.5)
nRBC: 0 % (ref 0.0–0.2)

## 2021-12-19 LAB — TSH: TSH: 1.232 u[IU]/mL (ref 0.308–3.960)

## 2021-12-19 MED ORDER — DIPHENHYDRAMINE HCL 50 MG/ML IJ SOLN
12.5000 mg | Freq: Once | INTRAMUSCULAR | Status: AC
  Administered 2021-12-19: 12.5 mg via INTRAVENOUS
  Filled 2021-12-19: qty 1

## 2021-12-19 MED ORDER — FAMOTIDINE IN NACL 20-0.9 MG/50ML-% IV SOLN
20.0000 mg | Freq: Once | INTRAVENOUS | Status: AC
  Administered 2021-12-19: 20 mg via INTRAVENOUS
  Filled 2021-12-19: qty 50

## 2021-12-19 MED ORDER — SODIUM CHLORIDE 0.9% FLUSH
10.0000 mL | Freq: Once | INTRAVENOUS | Status: AC
  Administered 2021-12-19: 10 mL

## 2021-12-19 MED ORDER — SODIUM CHLORIDE 0.9 % IV SOLN
80.0000 mg/m2 | Freq: Once | INTRAVENOUS | Status: AC
  Administered 2021-12-19: 108 mg via INTRAVENOUS
  Filled 2021-12-19: qty 18

## 2021-12-19 MED ORDER — HEPARIN SOD (PORK) LOCK FLUSH 100 UNIT/ML IV SOLN
500.0000 [IU] | Freq: Once | INTRAVENOUS | Status: AC | PRN
  Administered 2021-12-19: 500 [IU]

## 2021-12-19 MED ORDER — SODIUM CHLORIDE 0.9% FLUSH
10.0000 mL | INTRAVENOUS | Status: DC | PRN
  Administered 2021-12-19: 10 mL

## 2021-12-19 MED ORDER — SODIUM CHLORIDE 0.9 % IV SOLN: Freq: Once | INTRAVENOUS | Status: AC

## 2021-12-19 MED ORDER — SODIUM CHLORIDE 0.9 % IV SOLN
10.0000 mg | Freq: Once | INTRAVENOUS | Status: AC
  Administered 2021-12-19: 10 mg via INTRAVENOUS
  Filled 2021-12-19: qty 10

## 2021-12-19 NOTE — Progress Notes (Signed)
[facility] ?OFFICE PROGRESS NOTE ? ?Patient Care Team: ?Default, Provider, MD as PCP - General ? ?ASSESSMENT & PLAN:  ?Malignant neoplasm of cervix ([facility]) ?So far, she tolerated single agent Taxol without difficulties ?Her blood count looks stable ?I recommend we modify her treatment to weekly x2 weeks and then rest 1 week ?We will monitor her blood counts closely ?I plan to repeat imaging study after 3 months of treatment, due around May ? ?Vitamin B12 deficiency ?We will continue B12 injections monthly ?I plan to monitor her B12 closely, repeat level is slightly better ? ?Acquired hypothyroidism ?She has acquired hypothyroidism due to checkpoint inhibitors ?I refilled her Synthroid today ?We will continue to monitor closely and adjust her medication as needed ? ?Deficiency anemia ?This is likely anemia of chronic disease. The patient denies recent history of bleeding such as epistaxis, hematuria or hematochezia. She is asymptomatic from the anemia. We will observe for now.  ? ?Orders Placed This Encounter  ?Procedures  ? TSH  ?  Standing Status:   Standing  ?  Number of Occurrences:   22  ?  Standing Expiration Date:   12/20/2022  ? ? ?All questions were answered. The patient knows to call the clinic with any problems, questions or concerns. ?The total time spent in the appointment was 25 minutes encounter with patients including review of chart and various tests results, discussions about plan of care and coordination of care plan ?  ?[name], MD ?12/19/2021 2:52 PM ? ?INTERVAL HISTORY: ?Please see below for problem oriented charting. ?she returns for treatment follow-up seen prior to her Taxol treatment ?She is doing well ?Denies side effects from treatment so far ?Denies peripheral neuropathy ?No recent vaginal bleeding or pelvic cramps ? ?REVIEW OF SYSTEMS:   ?Constitutional: Denies fevers, chills or abnormal weight loss ?Eyes: Denies blurriness of vision ?Ears, nose, mouth, throat, and face:  Denies mucositis or sore throat ?Respiratory: Denies cough, dyspnea or wheezes ?Cardiovascular: Denies palpitation, chest discomfort or lower extremity swelling ?Gastrointestinal:  Denies nausea, heartburn or change in bowel habits ?Skin: Denies abnormal skin rashes ?Lymphatics: Denies new lymphadenopathy or easy bruising ?Neurological:Denies numbness, tingling or new weaknesses ?Behavioral/Psych: Mood is stable, no new changes  ?All other systems were reviewed with the patient and are negative. ? ?I have reviewed the past medical history, past surgical history, social history and family history with the patient and they are unchanged from previous note. ? ?ALLERGIES:  has No Known Allergies. ? ?MEDICATIONS:  ?Current Outpatient Medications  ?Medication Sig Dispense Refill  ? levothyroxine (SYNTHROID) 100 MCG tablet TAKE 1 TABLET(100 MCG) BY MOUTH DAILY BEFORE BREAKFAST 30 tablet 0  ? lidocaine-prilocaine (EMLA) cream Apply to affected area once 30 g 3  ? Multiple Vitamin (MULTIVITAMIN) tablet Take 1 tablet by mouth daily.    ? ondansetron (ZOFRAN) 8 MG tablet Take 1 tablet (8 mg total) by mouth 2 (two) times daily as needed (Nausea or vomiting). 30 tablet 1  ? prochlorperazine (COMPAZINE) 10 MG tablet Take 1 tablet (10 mg total) by mouth every 6 (six) hours as needed (Nausea or vomiting). 30 tablet 1  ? ?No current facility-administered medications for this visit.  ? ?Facility-Administered Medications Ordered in Other Visits  ?Medication Dose Route Frequency Provider Last Rate Last Admin  ? sodium chloride flush (NS) 0.9 % injection 10 mL  10 mL Intracatheter PRN Alvy Bimler, Kimanh Templeman, MD   10 mL at 12/19/21 1435  ? ? ?SUMMARY OF ONCOLOGIC HISTORY: ?Oncology History Overview  Note  ?Cancer Staging ?Malignant neoplasm of cervix ([facility]) ?Staging form: Cervix Uteri, AJCC 8th Edition ?- Clinical: Stage IIIB (cT3b, cN1, cM0) - Signed by [name], MD on 05/04/2018 ? ?PD-L1 1% ?Progressed on carboplatin, pembrolizumab and  cisplatin ?  ?Malignant neoplasm of cervix ([place])  ?03/13/2018 Imaging  ? US pelvis ?There are mobile echogenic foci within the endometrial cavity suggestive of gas. This is nonspecific in etiology however may be secondary to endometritis. Correlate for history of recent instrumentation. This obscures visualization of the endometrial tissue and therefore a follow-up pelvic ultrasound in 2-4 weeks is recommended after resolution of the acute symptomatology if symptoms persist to further evaluate the endometrium. ?  ?  ?03/13/2018 Initial Diagnosis  ? She initially presented with PMB ?  ?04/12/2018 Pathology Results  ? Cervix, biopsy, mass ?- INVASIVE SQUAMOUS CELL CARCINOMA ?- SEE COMMENT ?  ?04/12/2018 Surgery  ? PREOPERATIVE DIAGNOSIS:  Postmenopausal vaginal bleeding. ?POSTOPERATIVE DIAGNOSIS: The same ?PROCEDURE: Exam under anesthesia, pap smear, cervical mass biopsy ?SURGEON:  Dr. [name] ?  ?INDICATIONS: 71 y.o. yo G0P0000 with PMB here for exam under anesthesia.Risks of surgery were discussed with the patient including but not limited to: bleeding which may require transfusion; infection which may require antibiotics; injury to uterus or surrounding organs; need for additional procedures including laparotomy or laparoscopy; and other postoperative/anesthesia complications. Written informed consent was obtained.   ?  ?FINDINGS:  An 8-week size midline uterus.  No adnexal mass palpable on exam. Normal cervix not visualized. Friable mass seen in involving the vagina at the level of the cervix obliterating the anterior and posterior fornix. ?  ?ANESTHESIA:   General ?INTRAVENOUS FLUIDS:  300 ml of LR ?ESTIMATED BLOOD LOSS: 20 ml. ?SPECIMENS: pap smear, cervical mass biopsy ?COMPLICATIONS:  None immediate. ?  ?  ?04/25/2018 PET scan  ? 1. Large cervical mass may invade into the myometrium in down into the vagina, maximum SUV 21.3. There is a malignant left external iliac node measuring 1.4 cm in short axis with  maximum SUV 14.1. ?2. Accentuated activity in the cecum and ascending colon is likely physiologic given that it has no CT correlate. Correlation with the patient's colon cancer screening history is recommended. If screening is not up-to-date, appropriate screening should be considered. ?3.  Aortic Atherosclerosis (ICD10-I70.0). ?  ?  ?04/28/2018 Surgery  ? Pre-operative Diagnosis:  At least Stage 2B SCCa Cervical Cancer ?  ?Post-operative Diagnosis: At least clinical Stage 3A SCCa Cervical Cancer ?  ?Operation:  ?Exam under anesthesia due to intolerance for vaginal exam in office ?Cystoscopy due to concern for extension to posterior bladder (from anterior vaginal wall lesion) ?  ?Operative Findings:   ?Parametrial involvement on right  ?Right sided subvaginal tumor burden ~3cm. This approximates the right ureteral insertion into the bladder based on my exam during cystoscopy. ?Extension of disease down upper half of vagina on lateral and posterior walls. ?Extension of disease down to lower third of anterior vagina (versus skip lesion) thus making her Stage 3A ?Cystoscopy revealed patent bilateral ureteral orifices and no bladder mucosa invasion or areas of concern. ?Rectal exam grossly no disease. ?  ?  ?05/04/2018 Cancer Staging  ? Staging form: Cervix Uteri, AJCC 8th Edition ?- Clinical: Stage IIIB (cT3b, cN1, cM0) - Signed by [name], MD on 05/04/2018 ? ?  ?05/12/2018 Procedure  ? Successful 8 French right internal jugular vein power port placement with its tip at the SVC/RA junction ?  ?05/13/2018 - 06/24/2018 Chemotherapy  ?  The patient had weekly cisplatin given with concurrent radiation ? ?  ?05/17/2018 - 07/27/2018 Radiation Therapy  ? Radiation treatment dates:   05/17/2018-07/27/2018 ?  ?Site/dose: 1. Cervix, 1.8 Gy in 25 fractions for a total dose of 45 Gy ?                   2. Pelvis  Boost, 1.8 Gy in 5 fractions for a total dose of 9 Gy ?                   3. Cervix, 5.5 Gy in 5 fractions for a total dose  of 27.5 Gy ?  ?06/03/2018 Adverse Reaction  ? She missed her chemo due to severe depression and was sent to the ER for suicidal ideation.  She was seen by psychiatrist. ?  ?10/27/2018 PET scan  ? IMPRESSION: ?1. Complet

## 2021-12-19 NOTE — Assessment & Plan Note (Signed)
She has acquired hypothyroidism due to checkpoint inhibitors ?I refilled her Synthroid today ?We will continue to monitor closely and adjust her medication as needed ?

## 2021-12-19 NOTE — Assessment & Plan Note (Signed)
So far, she tolerated single agent Taxol without difficulties ?Her blood count looks stable ?I recommend we modify her treatment to weekly x2 weeks and then rest 1 week ?We will monitor her blood counts closely ?I plan to repeat imaging study after 3 months of treatment, due around May ?

## 2021-12-19 NOTE — Telephone Encounter (Signed)
Schgeduled appointment per 4/7 scheduling message. Talked with the patients friend [name]) and she is aware of the upcoming appointments. ?

## 2021-12-19 NOTE — Patient Instructions (Signed)
[facility]  Discharge Instructions: ?Thank you for choosing [facility] to provide your oncology and hematology care.  ? ?If you have a lab appointment with the [facility], please go directly to the [facility] and check in at the registration area. ?  ?Wear comfortable clothing and clothing appropriate for easy access to any Portacath or PICC line.  ? ?We strive to give you quality time with your provider. You may need to reschedule your appointment if you arrive late (15 or more minutes).  Arriving late affects you and other patients whose appointments are after yours.  Also, if you miss three or more appointments without notifying the office, you may be dismissed from the clinic at the provider?s discretion.    ?  ?For prescription refill requests, have your pharmacy contact our office and allow 72 hours for refills to be completed.   ? ?Today you received the following chemotherapy and/or immunotherapy agents: Paclitaxel.    ?  ?To help prevent nausea and vomiting after your treatment, we encourage you to take your nausea medication as directed. ? ?BELOW ARE SYMPTOMS THAT SHOULD BE REPORTED IMMEDIATELY: ?*FEVER GREATER THAN 100.4 F (38 ?C) OR HIGHER ?*CHILLS OR SWEATING ?*NAUSEA AND VOMITING THAT IS NOT CONTROLLED WITH YOUR NAUSEA MEDICATION ?*UNUSUAL SHORTNESS OF BREATH ?*UNUSUAL BRUISING OR BLEEDING ?*URINARY PROBLEMS (pain or burning when urinating, or frequent urination) ?*BOWEL PROBLEMS (unusual diarrhea, constipation, pain near the anus) ?TENDERNESS IN MOUTH AND THROAT WITH OR WITHOUT PRESENCE OF ULCERS (sore throat, sores in mouth, or a toothache) ?UNUSUAL RASH, SWELLING OR PAIN  ?UNUSUAL VAGINAL DISCHARGE OR ITCHING  ? ?Items with * indicate a potential emergency and should be followed up as soon as possible or go to the Emergency Department if any problems should occur. ? ?Please show the CHEMOTHERAPY ALERT CARD or IMMUNOTHERAPY ALERT CARD at check-in  to the Emergency Department and triage nurse. ? ?Should you have questions after your visit or need to cancel or reschedule your appointment, please contact [facility]  Dept: [phone]  and follow the prompts.  Office hours are 8:00 a.m. to 4:30 p.m. Monday - Friday. Please note that voicemails left after 4:00 p.m. may not be returned until the following business day.  We are closed weekends and major holidays. You have access to a nurse at all times for urgent questions. Please call the main number to the clinic Dept: [phone] and follow the prompts. ? ? ?For any non-urgent questions, you may also contact your provider using MyChart. We now offer e-Visits for anyone 2 and older to request care online for non-urgent symptoms. For details visit [email or web address]. ?  ?Also download the MyChart app! Go to the app store, search "MyChart", open the app, select [facility], and log in with your MyChart username and password. ? ?Due to Covid, a mask is required upon entering the hospital/clinic. If you do not have a mask, one will be given to you upon arrival. For doctor visits, patients may have 1 support person aged 64 or older with them. For treatment visits, patients cannot have anyone with them due to current Covid guidelines and our immunocompromised population.  ? ?

## 2021-12-19 NOTE — Assessment & Plan Note (Signed)
This is likely anemia of chronic disease. The patient denies recent history of bleeding such as epistaxis, hematuria or hematochezia. She is asymptomatic from the anemia. We will observe for now.  

## 2021-12-19 NOTE — Assessment & Plan Note (Signed)
We will continue B12 injections monthly I plan to monitor her B12 closely, repeat level is slightly better 

## 2021-12-24 ENCOUNTER — Other Ambulatory Visit: Payer: Medicare Other

## 2021-12-24 ENCOUNTER — Ambulatory Visit: Payer: Medicare Other

## 2022-01-02 ENCOUNTER — Other Ambulatory Visit: Payer: Self-pay

## 2022-01-02 ENCOUNTER — Inpatient Hospital Stay (HOSPITAL_BASED_OUTPATIENT_CLINIC_OR_DEPARTMENT_OTHER): Payer: Medicare Other | Admitting: Hematology and Oncology

## 2022-01-02 ENCOUNTER — Encounter: Payer: Self-pay | Admitting: Hematology and Oncology

## 2022-01-02 ENCOUNTER — Telehealth: Payer: Self-pay

## 2022-01-02 ENCOUNTER — Inpatient Hospital Stay: Payer: Medicare Other

## 2022-01-02 ENCOUNTER — Other Ambulatory Visit: Payer: Self-pay | Admitting: Hematology and Oncology

## 2022-01-02 DIAGNOSIS — N1330 Unspecified hydronephrosis: Secondary | ICD-10-CM | POA: Diagnosis not present

## 2022-01-02 DIAGNOSIS — Z7189 Other specified counseling: Secondary | ICD-10-CM

## 2022-01-02 DIAGNOSIS — D539 Nutritional anemia, unspecified: Secondary | ICD-10-CM | POA: Diagnosis not present

## 2022-01-02 DIAGNOSIS — Z5111 Encounter for antineoplastic chemotherapy: Secondary | ICD-10-CM | POA: Diagnosis not present

## 2022-01-02 DIAGNOSIS — C539 Malignant neoplasm of cervix uteri, unspecified: Secondary | ICD-10-CM

## 2022-01-02 DIAGNOSIS — D61818 Other pancytopenia: Secondary | ICD-10-CM

## 2022-01-02 DIAGNOSIS — E538 Deficiency of other specified B group vitamins: Secondary | ICD-10-CM

## 2022-01-02 DIAGNOSIS — E039 Hypothyroidism, unspecified: Secondary | ICD-10-CM

## 2022-01-02 LAB — CMP (CANCER CENTER ONLY)
ALT: 9 U/L (ref 0–44)
AST: 15 U/L (ref 15–41)
Albumin: 4.1 g/dL (ref 3.5–5.0)
Alkaline Phosphatase: 59 U/L (ref 38–126)
Anion gap: 9 (ref 5–15)
BUN: 22 mg/dL (ref 8–23)
CO2: 26 mmol/L (ref 22–32)
Calcium: 8.9 mg/dL (ref 8.9–10.3)
Chloride: 107 mmol/L (ref 98–111)
Creatinine: 1.16 mg/dL — ABNORMAL HIGH (ref 0.44–1.00)
GFR, Estimated: 51 mL/min — ABNORMAL LOW (ref >60)
Glucose, Bld: 96 mg/dL (ref 70–99)
Potassium: 4.2 mmol/L (ref 3.5–5.1)
Sodium: 142 mmol/L (ref 135–145)
Total Bilirubin: 0.3 mg/dL (ref 0.3–1.2)
Total Protein: 7.2 g/dL (ref 6.5–8.1)

## 2022-01-02 LAB — CBC WITH DIFFERENTIAL (CANCER CENTER ONLY)
Abs Immature Granulocytes: 0.1 10*3/uL — ABNORMAL HIGH (ref 0.00–0.07)
Basophils Absolute: 0.1 10*3/uL (ref 0.0–0.1)
Basophils Relative: 1 %
Eosinophils Absolute: 0.1 10*3/uL (ref 0.0–0.5)
Eosinophils Relative: 1 %
HCT: 32.4 % — ABNORMAL LOW (ref 36.0–46.0)
Hemoglobin: 10.4 g/dL — ABNORMAL LOW (ref 12.0–15.0)
Immature Granulocytes: 2 %
Lymphocytes Relative: 14 %
Lymphs Abs: 0.9 10*3/uL (ref 0.7–4.0)
MCH: 30.8 pg (ref 26.0–34.0)
MCHC: 32.1 g/dL (ref 30.0–36.0)
MCV: 95.9 fL (ref 80.0–100.0)
Monocytes Absolute: 1.1 10*3/uL — ABNORMAL HIGH (ref 0.1–1.0)
Monocytes Relative: 17 %
Neutro Abs: 4.4 10*3/uL (ref 1.7–7.7)
Neutrophils Relative %: 65 %
Platelet Count: 209 10*3/uL (ref 150–400)
RBC: 3.38 MIL/uL — ABNORMAL LOW (ref 3.87–5.11)
RDW: 13.4 % (ref 11.5–15.5)
WBC Count: 6.7 10*3/uL (ref 4.0–10.5)
nRBC: 0 % (ref 0.0–0.2)

## 2022-01-02 LAB — TSH: TSH: 0.216 u[IU]/mL — ABNORMAL LOW (ref 0.350–4.500)

## 2022-01-02 MED ORDER — FAMOTIDINE IN NACL 20-0.9 MG/50ML-% IV SOLN
20.0000 mg | Freq: Once | INTRAVENOUS | Status: AC
  Administered 2022-01-02: 20 mg via INTRAVENOUS
  Filled 2022-01-02: qty 50

## 2022-01-02 MED ORDER — SODIUM CHLORIDE 0.9 % IV SOLN
10.0000 mg | Freq: Once | INTRAVENOUS | Status: AC
  Administered 2022-01-02: 10 mg via INTRAVENOUS
  Filled 2022-01-02: qty 10

## 2022-01-02 MED ORDER — HEPARIN SOD (PORK) LOCK FLUSH 100 UNIT/ML IV SOLN
500.0000 [IU] | Freq: Once | INTRAVENOUS | Status: AC | PRN
  Administered 2022-01-02: 500 [IU]

## 2022-01-02 MED ORDER — SODIUM CHLORIDE 0.9 % IV SOLN: Freq: Once | INTRAVENOUS | Status: AC

## 2022-01-02 MED ORDER — SODIUM CHLORIDE 0.9% FLUSH
10.0000 mL | INTRAVENOUS | Status: DC | PRN
  Administered 2022-01-02: 10 mL

## 2022-01-02 MED ORDER — DIPHENHYDRAMINE HCL 50 MG/ML IJ SOLN
12.5000 mg | Freq: Once | INTRAMUSCULAR | Status: AC
  Administered 2022-01-02: 12.5 mg via INTRAVENOUS
  Filled 2022-01-02: qty 1

## 2022-01-02 MED ORDER — SODIUM CHLORIDE 0.9% FLUSH
10.0000 mL | Freq: Once | INTRAVENOUS | Status: AC
  Administered 2022-01-02: 10 mL

## 2022-01-02 MED ORDER — SODIUM CHLORIDE 0.9 % IV SOLN
80.0000 mg/m2 | Freq: Once | INTRAVENOUS | Status: AC
  Administered 2022-01-02: 108 mg via INTRAVENOUS
  Filled 2022-01-02: qty 18

## 2022-01-02 MED ORDER — LEVOTHYROXINE SODIUM 50 MCG PO TABS: 50.0000 ug | ORAL_TABLET | Freq: Every day | ORAL | 1 refills | Status: DC

## 2022-01-02 NOTE — Telephone Encounter (Signed)
-----   Message from [name], MD sent at 01/02/2022  2:37 PM EDT ----- ?Her TSH is still suppressed ?I recommend reducing synthroid to 50 mcg ?Call in new prescription to her pharmacy ? ?

## 2022-01-02 NOTE — Patient Instructions (Signed)
[facility]  Discharge Instructions: ?Thank you for choosing [facility] to provide your oncology and hematology care.  ? ?If you have a lab appointment with the [facility], please go directly to the [facility] and check in at the registration area. ?  ?Wear comfortable clothing and clothing appropriate for easy access to any Portacath or PICC line.  ? ?We strive to give you quality time with your provider. You may need to reschedule your appointment if you arrive late (15 or more minutes).  Arriving late affects you and other patients whose appointments are after yours.  Also, if you miss three or more appointments without notifying the office, you may be dismissed from the clinic at the provider?s discretion.    ?  ?For prescription refill requests, have your pharmacy contact our office and allow 72 hours for refills to be completed.   ? ?Today you received the following chemotherapy and/or immunotherapy agents: Paclitaxel    ?  ?To help prevent nausea and vomiting after your treatment, we encourage you to take your nausea medication as directed. ? ?BELOW ARE SYMPTOMS THAT SHOULD BE REPORTED IMMEDIATELY: ?*FEVER GREATER THAN 100.4 F (38 ?C) OR HIGHER ?*CHILLS OR SWEATING ?*NAUSEA AND VOMITING THAT IS NOT CONTROLLED WITH YOUR NAUSEA MEDICATION ?*UNUSUAL SHORTNESS OF BREATH ?*UNUSUAL BRUISING OR BLEEDING ?*URINARY PROBLEMS (pain or burning when urinating, or frequent urination) ?*BOWEL PROBLEMS (unusual diarrhea, constipation, pain near the anus) ?TENDERNESS IN MOUTH AND THROAT WITH OR WITHOUT PRESENCE OF ULCERS (sore throat, sores in mouth, or a toothache) ?UNUSUAL RASH, SWELLING OR PAIN  ?UNUSUAL VAGINAL DISCHARGE OR ITCHING  ? ?Items with * indicate a potential emergency and should be followed up as soon as possible or go to the Emergency Department if any problems should occur. ? ?Please show the CHEMOTHERAPY ALERT CARD or IMMUNOTHERAPY ALERT CARD at check-in to  the Emergency Department and triage nurse. ? ?Should you have questions after your visit or need to cancel or reschedule your appointment, please contact [facility]  Dept: [phone]  and follow the prompts.  Office hours are 8:00 a.m. to 4:30 p.m. Monday - Friday. Please note that voicemails left after 4:00 p.m. may not be returned until the following business day.  We are closed weekends and major holidays. You have access to a nurse at all times for urgent questions. Please call the main number to the clinic Dept: [phone] and follow the prompts. ? ? ?For any non-urgent questions, you may also contact your provider using MyChart. We now offer e-Visits for anyone 58 and older to request care online for non-urgent symptoms. For details visit [email or web address]. ?  ?Also download the MyChart app! Go to the app store, search "MyChart", open the app, select [facility], and log in with your MyChart username and password. ? ?Due to Covid, a mask is required upon entering the hospital/clinic. If you do not have a mask, one will be given to you upon arrival. For doctor visits, patients may have 1 support person aged 47 or older with them. For treatment visits, patients cannot have anyone with them due to current Covid guidelines and our immunocompromised population.  ? ?

## 2022-01-02 NOTE — Telephone Encounter (Signed)
Called and left below message for [facility]. Rx sent and ask her to call the office back for questions. ?

## 2022-01-02 NOTE — Assessment & Plan Note (Signed)
So far, she tolerated single agent Taxol without difficulties ?Her blood count looks stable ?We will continue modified treatment as scheduled ?We will monitor her blood counts closely ?I plan to repeat imaging study after 3 months of treatment, due around May ?

## 2022-01-02 NOTE — Assessment & Plan Note (Signed)
She has hydronephrosis but renal function is reasonably stable ?I do not recommend stent placement ?

## 2022-01-02 NOTE — Progress Notes (Signed)
[facility] ?OFFICE PROGRESS NOTE ? ?Patient Care Team: ?Default, Provider, MD as PCP - General ? ?ASSESSMENT & PLAN:  ?Malignant neoplasm of cervix ([place]) ?So far, she tolerated single agent Taxol without difficulties ?Her blood count looks stable ?We will continue modified treatment as scheduled ?We will monitor her blood counts closely ?I plan to repeat imaging study after 3 months of treatment, due around May ? ?Deficiency anemia ?This is likely anemia of chronic disease. The patient denies recent history of bleeding such as epistaxis, hematuria or hematochezia. She is asymptomatic from the anemia. We will observe for now.  ? ?Hydronephrosis of right kidney ?She has hydronephrosis but renal function is reasonably stable ?I do not recommend stent placement ? ?No orders of the defined types were placed in this encounter. ? ? ?All questions were answered. The patient knows to call the clinic with any problems, questions or concerns. ?The total time spent in the appointment was 20 minutes encounter with patients including review of chart and various tests results, discussions about plan of care and coordination of care plan ?  ?[name], MD ?01/02/2022 1:32 PM ? ?INTERVAL HISTORY: ?Please see below for problem oriented charting. ?she returns for treatment follow-up on single agent Taxol for recurrent cervical cancer ?She had very mild neuropathy but it does not bother her ?Appetite is fair although she have lost a bit of weight ?Denies recent pelvic pain or bleeding ? ?REVIEW OF SYSTEMS:   ?Constitutional: Denies fevers, chills or abnormal weight loss ?Eyes: Denies blurriness of vision ?Ears, nose, mouth, throat, and face: Denies mucositis or sore throat ?Respiratory: Denies cough, dyspnea or wheezes ?Cardiovascular: Denies palpitation, chest discomfort or lower extremity swelling ?Gastrointestinal:  Denies nausea, heartburn or change in bowel habits ?Skin: Denies abnormal skin rashes ?Lymphatics:  Denies new lymphadenopathy or easy bruising ?Behavioral/Psych: Mood is stable, no new changes  ?All other systems were reviewed with the patient and are negative. ? ?I have reviewed the past medical history, past surgical history, social history and family history with the patient and they are unchanged from previous note. ? ?ALLERGIES:  has No Known Allergies. ? ?MEDICATIONS:  ?Current Outpatient Medications  ?Medication Sig Dispense Refill  ? levothyroxine (SYNTHROID) 100 MCG tablet TAKE 1 TABLET(100 MCG) BY MOUTH DAILY BEFORE BREAKFAST 30 tablet 0  ? lidocaine-prilocaine (EMLA) cream Apply to affected area once 30 g 3  ? Multiple Vitamin (MULTIVITAMIN) tablet Take 1 tablet by mouth daily.    ? ondansetron (ZOFRAN) 8 MG tablet Take 1 tablet (8 mg total) by mouth 2 (two) times daily as needed (Nausea or vomiting). 30 tablet 1  ? prochlorperazine (COMPAZINE) 10 MG tablet Take 1 tablet (10 mg total) by mouth every 6 (six) hours as needed (Nausea or vomiting). 30 tablet 1  ? ?No current facility-administered medications for this visit.  ? ?Facility-Administered Medications Ordered in Other Visits  ?Medication Dose Route Frequency Provider Last Rate Last Admin  ? heparin lock flush 100 unit/mL  500 Units Intracatheter Once PRN [name], MD      ? PACLitaxel (TAXOL) 108 mg in sodium chloride 0.9 % 250 mL chemo infusion (</= 90m/m2)  80 mg/m2 (Treatment Plan Recorded) Intravenous Once GAlvy Bimler Shaniquia Brafford, MD      ? sodium chloride flush (NS) 0.9 % injection 10 mL  10 mL Intracatheter PRN GHeath Lark MD      ? ? ?SUMMARY OF ONCOLOGIC HISTORY: ?Oncology History Overview Note  ?Cancer Staging ?Malignant neoplasm of cervix ([place] ?Staging form:  Cervix Uteri, AJCC 8th Edition ?- Clinical: Stage IIIB (cT3b, cN1, cM0) - Signed by [name], MD on 05/04/2018 ? ?PD-L1 1% ?Progressed on carboplatin, pembrolizumab and cisplatin ?  ?Malignant neoplasm of cervix ([facility])  ?03/13/2018 Imaging  ? US pelvis ?There are mobile echogenic foci  within the endometrial cavity suggestive of gas. This is nonspecific in etiology however may be secondary to endometritis. Correlate for history of recent instrumentation. This obscures visualization of the endometrial tissue and therefore a follow-up pelvic ultrasound in 2-4 weeks is recommended after resolution of the acute symptomatology if symptoms persist to further evaluate the endometrium. ?  ? ?  ?03/13/2018 Initial Diagnosis  ? She initially presented with PMB ? ?  ?04/12/2018 Pathology Results  ? Cervix, biopsy, mass ?- INVASIVE SQUAMOUS CELL CARCINOMA ?- SEE COMMENT ? ?  ?04/12/2018 Surgery  ? PREOPERATIVE DIAGNOSIS:  Postmenopausal vaginal bleeding. ?POSTOPERATIVE DIAGNOSIS: The same ?PROCEDURE: Exam under anesthesia, pap smear, cervical mass biopsy ?SURGEON:  Dr. [name] ?  ?INDICATIONS: 71 y.o. yo G0P0000 with PMB here for exam under anesthesia.Risks of surgery were discussed with the patient including but not limited to: bleeding which may require transfusion; infection which may require antibiotics; injury to uterus or surrounding organs; need for additional procedures including laparotomy or laparoscopy; and other postoperative/anesthesia complications. Written informed consent was obtained.   ?  ?FINDINGS:  An 8-week size midline uterus.  No adnexal mass palpable on exam. Normal cervix not visualized. Friable mass seen in involving the vagina at the level of the cervix obliterating the anterior and posterior fornix. ?  ?ANESTHESIA:   General ?INTRAVENOUS FLUIDS:  300 ml of LR ?ESTIMATED BLOOD LOSS: 20 ml. ?SPECIMENS: pap smear, cervical mass biopsy ?COMPLICATIONS:  None immediate. ?  ? ?  ?04/25/2018 PET scan  ? 1. Large cervical mass may invade into the myometrium in down into the vagina, maximum SUV 21.3. There is a malignant left external iliac node measuring 1.4 cm in short axis with maximum SUV 14.1. ?2. Accentuated activity in the cecum and ascending colon is likely physiologic given that  it has no CT correlate. Correlation with the patient's colon cancer screening history is recommended. If screening is not up-to-date, appropriate screening should be considered. ?3.  Aortic Atherosclerosis (ICD10-I70.0). ?  ? ?  ?04/28/2018 Surgery  ? Pre-operative Diagnosis:  At least Stage 2B SCCa Cervical Cancer ?  ?Post-operative Diagnosis: At least clinical Stage 3A SCCa Cervical Cancer ?  ?Operation:  ?Exam under anesthesia due to intolerance for vaginal exam in office ?Cystoscopy due to concern for extension to posterior bladder (from anterior vaginal wall lesion) ?  ?Operative Findings:   ?Parametrial involvement on right  ?Right sided subvaginal tumor burden ~3cm. This approximates the right ureteral insertion into the bladder based on my exam during cystoscopy. ?Extension of disease down upper half of vagina on lateral and posterior walls. ?Extension of disease down to lower third of anterior vagina (versus skip lesion) thus making her Stage 3A ?Cystoscopy revealed patent bilateral ureteral orifices and no bladder mucosa invasion or areas of concern. ?Rectal exam grossly no disease. ?  ?  ?05/04/2018 Cancer Staging  ? Staging form: Cervix Uteri, AJCC 8th Edition ?- Clinical: Stage IIIB (cT3b, cN1, cM0) - Signed by [name], MD on 05/04/2018 ? ?  ?05/12/2018 Procedure  ? Successful 8 French right internal jugular vein power port placement with its tip at the SVC/RA junction ? ?  ?05/13/2018 - 06/24/2018 Chemotherapy  ? The patient had weekly cisplatin  given with concurrent radiation ? ? ?  ?05/17/2018 - 07/27/2018 Radiation Therapy  ? Radiation treatment dates:   05/17/2018-07/27/2018 ?  ?Site/dose: 1. Cervix, 1.8 Gy in 25 fractions for a total dose of 45 Gy ?                   2. Pelvis  Boost, 1.8 Gy in 5 fractions for a total dose of 9 Gy ?                   3. Cervix, 5.5 Gy in 5 fractions for a total dose of 27.5 Gy ? ?  ?06/03/2018 Adverse Reaction  ? She missed her chemo due to severe depression and was  sent to the ER for suicidal ideation.  She was seen by psychiatrist. ? ?  ?10/27/2018 PET scan  ? IMPRESSION: ?1. Complete metabolic response to therapy, without residual or recurrent hypermetabolic disease. ?2.

## 2022-01-02 NOTE — Assessment & Plan Note (Signed)
This is likely anemia of chronic disease. The patient denies recent history of bleeding such as epistaxis, hematuria or hematochezia. She is asymptomatic from the anemia. We will observe for now.  

## 2022-01-09 ENCOUNTER — Other Ambulatory Visit: Payer: Self-pay

## 2022-01-09 ENCOUNTER — Inpatient Hospital Stay: Payer: Medicare Other

## 2022-01-09 VITALS — BP 141/77 | HR 81 | Temp 99.3°F | Resp 16 | Wt 89.8 lb

## 2022-01-09 DIAGNOSIS — E538 Deficiency of other specified B group vitamins: Secondary | ICD-10-CM

## 2022-01-09 DIAGNOSIS — Z7189 Other specified counseling: Secondary | ICD-10-CM

## 2022-01-09 DIAGNOSIS — Z5111 Encounter for antineoplastic chemotherapy: Secondary | ICD-10-CM | POA: Diagnosis not present

## 2022-01-09 DIAGNOSIS — C539 Malignant neoplasm of cervix uteri, unspecified: Secondary | ICD-10-CM

## 2022-01-09 DIAGNOSIS — D61818 Other pancytopenia: Secondary | ICD-10-CM

## 2022-01-09 LAB — CBC WITH DIFFERENTIAL (CANCER CENTER ONLY)
Abs Immature Granulocytes: 0.09 10*3/uL — ABNORMAL HIGH (ref 0.00–0.07)
Basophils Absolute: 0 10*3/uL (ref 0.0–0.1)
Basophils Relative: 1 %
Eosinophils Absolute: 0.1 10*3/uL (ref 0.0–0.5)
Eosinophils Relative: 1 %
HCT: 30.8 % — ABNORMAL LOW (ref 36.0–46.0)
Hemoglobin: 10.3 g/dL — ABNORMAL LOW (ref 12.0–15.0)
Immature Granulocytes: 2 %
Lymphocytes Relative: 15 %
Lymphs Abs: 0.9 10*3/uL (ref 0.7–4.0)
MCH: 31.2 pg (ref 26.0–34.0)
MCHC: 33.4 g/dL (ref 30.0–36.0)
MCV: 93.3 fL (ref 80.0–100.0)
Monocytes Absolute: 0.5 10*3/uL (ref 0.1–1.0)
Monocytes Relative: 9 %
Neutro Abs: 4.1 10*3/uL (ref 1.7–7.7)
Neutrophils Relative %: 72 %
Platelet Count: 195 10*3/uL (ref 150–400)
RBC: 3.3 MIL/uL — ABNORMAL LOW (ref 3.87–5.11)
RDW: 13.2 % (ref 11.5–15.5)
WBC Count: 5.6 10*3/uL (ref 4.0–10.5)
nRBC: 0 % (ref 0.0–0.2)

## 2022-01-09 LAB — CMP (CANCER CENTER ONLY)
ALT: 7 U/L (ref 0–44)
AST: 13 U/L — ABNORMAL LOW (ref 15–41)
Albumin: 4 g/dL (ref 3.5–5.0)
Alkaline Phosphatase: 50 U/L (ref 38–126)
Anion gap: 3 — ABNORMAL LOW (ref 5–15)
BUN: 22 mg/dL (ref 8–23)
CO2: 30 mmol/L (ref 22–32)
Calcium: 9.1 mg/dL (ref 8.9–10.3)
Chloride: 104 mmol/L (ref 98–111)
Creatinine: 1.13 mg/dL — ABNORMAL HIGH (ref 0.44–1.00)
GFR, Estimated: 53 mL/min — ABNORMAL LOW (ref >60)
Glucose, Bld: 114 mg/dL — ABNORMAL HIGH (ref 70–99)
Potassium: 4.4 mmol/L (ref 3.5–5.1)
Sodium: 137 mmol/L (ref 135–145)
Total Bilirubin: 0.2 mg/dL — ABNORMAL LOW (ref 0.3–1.2)
Total Protein: 6.8 g/dL (ref 6.5–8.1)

## 2022-01-09 MED ORDER — SODIUM CHLORIDE 0.9% FLUSH
10.0000 mL | Freq: Once | INTRAVENOUS | Status: AC
  Administered 2022-01-09: 10 mL

## 2022-01-09 MED ORDER — SODIUM CHLORIDE 0.9 % IV SOLN
10.0000 mg | Freq: Once | INTRAVENOUS | Status: AC
  Administered 2022-01-09: 10 mg via INTRAVENOUS
  Filled 2022-01-09: qty 10

## 2022-01-09 MED ORDER — DIPHENHYDRAMINE HCL 50 MG/ML IJ SOLN
12.5000 mg | Freq: Once | INTRAMUSCULAR | Status: AC
  Administered 2022-01-09: 12.5 mg via INTRAVENOUS
  Filled 2022-01-09: qty 1

## 2022-01-09 MED ORDER — FAMOTIDINE IN NACL 20-0.9 MG/50ML-% IV SOLN
20.0000 mg | Freq: Once | INTRAVENOUS | Status: AC
  Administered 2022-01-09: 20 mg via INTRAVENOUS
  Filled 2022-01-09: qty 50

## 2022-01-09 MED ORDER — HEPARIN SOD (PORK) LOCK FLUSH 100 UNIT/ML IV SOLN
500.0000 [IU] | Freq: Once | INTRAVENOUS | Status: AC | PRN
  Administered 2022-01-09: 500 [IU]

## 2022-01-09 MED ORDER — SODIUM CHLORIDE 0.9% FLUSH
10.0000 mL | INTRAVENOUS | Status: DC | PRN
  Administered 2022-01-09: 10 mL

## 2022-01-09 MED ORDER — CYANOCOBALAMIN 1000 MCG/ML IJ SOLN
1000.0000 ug | Freq: Once | INTRAMUSCULAR | Status: AC
  Administered 2022-01-09: 1000 ug via INTRAMUSCULAR
  Filled 2022-01-09: qty 1

## 2022-01-09 MED ORDER — SODIUM CHLORIDE 0.9 % IV SOLN
80.0000 mg/m2 | Freq: Once | INTRAVENOUS | Status: AC
  Administered 2022-01-09: 108 mg via INTRAVENOUS
  Filled 2022-01-09: qty 18

## 2022-01-09 MED ORDER — SODIUM CHLORIDE 0.9 % IV SOLN: Freq: Once | INTRAVENOUS | Status: AC

## 2022-01-09 NOTE — Patient Instructions (Signed)
[facility]  Discharge Instructions: ?Thank you for choosing [facility] to provide your oncology and hematology care.  ? ?If you have a lab appointment with the [facility], please go directly to the [facility] and check in at the registration area. ?  ?Wear comfortable clothing and clothing appropriate for easy access to any Portacath or PICC line.  ? ?We strive to give you quality time with your provider. You may need to reschedule your appointment if you arrive late (15 or more minutes).  Arriving late affects you and other patients whose appointments are after yours.  Also, if you miss three or more appointments without notifying the office, you may be dismissed from the clinic at the provider?s discretion.    ?  ?For prescription refill requests, have your pharmacy contact our office and allow 72 hours for refills to be completed.   ? ?Today you received the following chemotherapy and/or immunotherapy agents: Paclitaxel    ?  ?To help prevent nausea and vomiting after your treatment, we encourage you to take your nausea medication as directed. ? ?BELOW ARE SYMPTOMS THAT SHOULD BE REPORTED IMMEDIATELY: ?*FEVER GREATER THAN 100.4 F (38 ?C) OR HIGHER ?*CHILLS OR SWEATING ?*NAUSEA AND VOMITING THAT IS NOT CONTROLLED WITH YOUR NAUSEA MEDICATION ?*UNUSUAL SHORTNESS OF BREATH ?*UNUSUAL BRUISING OR BLEEDING ?*URINARY PROBLEMS (pain or burning when urinating, or frequent urination) ?*BOWEL PROBLEMS (unusual diarrhea, constipation, pain near the anus) ?TENDERNESS IN MOUTH AND THROAT WITH OR WITHOUT PRESENCE OF ULCERS (sore throat, sores in mouth, or a toothache) ?UNUSUAL RASH, SWELLING OR PAIN  ?UNUSUAL VAGINAL DISCHARGE OR ITCHING  ? ?Items with * indicate a potential emergency and should be followed up as soon as possible or go to the Emergency Department if any problems should occur. ? ?Please show the CHEMOTHERAPY ALERT CARD or IMMUNOTHERAPY ALERT CARD at check-in to  the Emergency Department and triage nurse. ? ?Should you have questions after your visit or need to cancel or reschedule your appointment, please contact [facility]  Dept: [phone]  and follow the prompts.  Office hours are 8:00 a.m. to 4:30 p.m. Monday - Friday. Please note that voicemails left after 4:00 p.m. may not be returned until the following business day.  We are closed weekends and major holidays. You have access to a nurse at all times for urgent questions. Please call the main number to the clinic Dept: [phone] and follow the prompts. ? ? ?For any non-urgent questions, you may also contact your provider using MyChart. We now offer e-Visits for anyone 20 and older to request care online for non-urgent symptoms. For details visit [email or web address]. ?  ?Also download the MyChart app! Go to the app store, search "MyChart", open the app, select [facility], and log in with your MyChart username and password. ? ?Due to Covid, a mask is required upon entering the hospital/clinic. If you do not have a mask, one will be given to you upon arrival. For doctor visits, patients may have 1 support person aged 25 or older with them. For treatment visits, patients cannot have anyone with them due to current Covid guidelines and our immunocompromised population.  ? ?

## 2022-01-23 ENCOUNTER — Other Ambulatory Visit: Payer: Self-pay

## 2022-01-23 ENCOUNTER — Inpatient Hospital Stay: Payer: Medicare Other

## 2022-01-23 ENCOUNTER — Encounter: Payer: Self-pay | Admitting: Hematology and Oncology

## 2022-01-23 ENCOUNTER — Inpatient Hospital Stay: Payer: Medicare Other | Attending: Hematology and Oncology | Admitting: Hematology and Oncology

## 2022-01-23 ENCOUNTER — Other Ambulatory Visit: Payer: Self-pay | Admitting: Hematology and Oncology

## 2022-01-23 ENCOUNTER — Telehealth: Payer: Self-pay

## 2022-01-23 VITALS — BP 143/84 | HR 73 | Temp 97.5°F | Resp 16 | Ht 62.0 in | Wt 89.4 lb

## 2022-01-23 DIAGNOSIS — D539 Nutritional anemia, unspecified: Secondary | ICD-10-CM | POA: Insufficient documentation

## 2022-01-23 DIAGNOSIS — Z79899 Other long term (current) drug therapy: Secondary | ICD-10-CM | POA: Diagnosis not present

## 2022-01-23 DIAGNOSIS — E039 Hypothyroidism, unspecified: Secondary | ICD-10-CM

## 2022-01-23 DIAGNOSIS — D61818 Other pancytopenia: Secondary | ICD-10-CM

## 2022-01-23 DIAGNOSIS — E538 Deficiency of other specified B group vitamins: Secondary | ICD-10-CM

## 2022-01-23 DIAGNOSIS — C539 Malignant neoplasm of cervix uteri, unspecified: Secondary | ICD-10-CM | POA: Diagnosis not present

## 2022-01-23 DIAGNOSIS — Z7189 Other specified counseling: Secondary | ICD-10-CM

## 2022-01-23 DIAGNOSIS — Z5111 Encounter for antineoplastic chemotherapy: Secondary | ICD-10-CM | POA: Diagnosis present

## 2022-01-23 LAB — CBC WITH DIFFERENTIAL (CANCER CENTER ONLY)
Abs Immature Granulocytes: 0.05 10*3/uL (ref 0.00–0.07)
Basophils Absolute: 0 10*3/uL (ref 0.0–0.1)
Basophils Relative: 1 %
Eosinophils Absolute: 0.1 10*3/uL (ref 0.0–0.5)
Eosinophils Relative: 1 %
HCT: 30.1 % — ABNORMAL LOW (ref 36.0–46.0)
Hemoglobin: 9.9 g/dL — ABNORMAL LOW (ref 12.0–15.0)
Immature Granulocytes: 1 %
Lymphocytes Relative: 18 %
Lymphs Abs: 0.8 10*3/uL (ref 0.7–4.0)
MCH: 30.7 pg (ref 26.0–34.0)
MCHC: 32.9 g/dL (ref 30.0–36.0)
MCV: 93.5 fL (ref 80.0–100.0)
Monocytes Absolute: 0.7 10*3/uL (ref 0.1–1.0)
Monocytes Relative: 15 %
Neutro Abs: 2.9 10*3/uL (ref 1.7–7.7)
Neutrophils Relative %: 64 %
Platelet Count: 191 10*3/uL (ref 150–400)
RBC: 3.22 MIL/uL — ABNORMAL LOW (ref 3.87–5.11)
RDW: 13.7 % (ref 11.5–15.5)
WBC Count: 4.5 10*3/uL (ref 4.0–10.5)
nRBC: 0 % (ref 0.0–0.2)

## 2022-01-23 LAB — CMP (CANCER CENTER ONLY)
ALT: 7 U/L (ref 0–44)
AST: 15 U/L (ref 15–41)
Albumin: 3.9 g/dL (ref 3.5–5.0)
Alkaline Phosphatase: 45 U/L (ref 38–126)
Anion gap: 6 (ref 5–15)
BUN: 15 mg/dL (ref 8–23)
CO2: 28 mmol/L (ref 22–32)
Calcium: 8.9 mg/dL (ref 8.9–10.3)
Chloride: 106 mmol/L (ref 98–111)
Creatinine: 1.22 mg/dL — ABNORMAL HIGH (ref 0.44–1.00)
GFR, Estimated: 48 mL/min — ABNORMAL LOW (ref >60)
Glucose, Bld: 95 mg/dL (ref 70–99)
Potassium: 4 mmol/L (ref 3.5–5.1)
Sodium: 140 mmol/L (ref 135–145)
Total Bilirubin: 0.3 mg/dL (ref 0.3–1.2)
Total Protein: 6.7 g/dL (ref 6.5–8.1)

## 2022-01-23 LAB — TSH: TSH: 3.96 u[IU]/mL (ref 0.350–4.500)

## 2022-01-23 MED ORDER — SODIUM CHLORIDE 0.9 % IV SOLN
10.0000 mg | Freq: Once | INTRAVENOUS | Status: AC
  Administered 2022-01-23: 10 mg via INTRAVENOUS
  Filled 2022-01-23: qty 10

## 2022-01-23 MED ORDER — HEPARIN SOD (PORK) LOCK FLUSH 100 UNIT/ML IV SOLN
500.0000 [IU] | Freq: Once | INTRAVENOUS | Status: AC | PRN
  Administered 2022-01-23: 500 [IU]

## 2022-01-23 MED ORDER — SODIUM CHLORIDE 0.9% FLUSH
10.0000 mL | INTRAVENOUS | Status: DC | PRN
  Administered 2022-01-23: 10 mL

## 2022-01-23 MED ORDER — DIPHENHYDRAMINE HCL 50 MG/ML IJ SOLN
12.5000 mg | Freq: Once | INTRAMUSCULAR | Status: AC
  Administered 2022-01-23: 12.5 mg via INTRAVENOUS
  Filled 2022-01-23: qty 1

## 2022-01-23 MED ORDER — LEVOTHYROXINE SODIUM 50 MCG PO TABS: ORAL_TABLET | ORAL | 1 refills | Status: DC

## 2022-01-23 MED ORDER — SODIUM CHLORIDE 0.9 % IV SOLN: Freq: Once | INTRAVENOUS | Status: AC

## 2022-01-23 MED ORDER — SODIUM CHLORIDE 0.9 % IV SOLN
80.0000 mg/m2 | Freq: Once | INTRAVENOUS | Status: AC
  Administered 2022-01-23: 108 mg via INTRAVENOUS
  Filled 2022-01-23: qty 18

## 2022-01-23 MED ORDER — SODIUM CHLORIDE 0.9% FLUSH
10.0000 mL | Freq: Once | INTRAVENOUS | Status: AC
  Administered 2022-01-23: 10 mL

## 2022-01-23 MED ORDER — FAMOTIDINE IN NACL 20-0.9 MG/50ML-% IV SOLN
20.0000 mg | Freq: Once | INTRAVENOUS | Status: AC
  Administered 2022-01-23: 20 mg via INTRAVENOUS
  Filled 2022-01-23: qty 50

## 2022-01-23 NOTE — Assessment & Plan Note (Signed)
So far, she tolerated single agent Taxol without difficulties ?Her blood count looks stable ?We will continue modified treatment as scheduled ?We will monitor her blood counts closely ?I plan to repeat imaging study in a few weeks ?

## 2022-01-23 NOTE — Patient Instructions (Signed)
[facility]   Discharge Instructions: Thank you for choosing [facility] to provide your oncology and hematology care.   If you have a lab appointment with the Cancer Center, please go directly to the Cancer Center and check in at the registration area.   Wear comfortable clothing and clothing appropriate for easy access to any Portacath or PICC line.   We strive to give you quality time with your provider. You may need to reschedule your appointment if you arrive late (15 or more minutes).  Arriving late affects you and other patients whose appointments are after yours.  Also, if you miss three or more appointments without notifying the office, you may be dismissed from the clinic at the provider's discretion.      For prescription refill requests, have your pharmacy contact our office and allow 72 hours for refills to be completed.    Today you received the following chemotherapy and/or immunotherapy agents: paclitaxel.      To help prevent nausea and vomiting after your treatment, we encourage you to take your nausea medication as directed.  BELOW ARE SYMPTOMS THAT SHOULD BE REPORTED IMMEDIATELY: *FEVER GREATER THAN 100.4 F (38 C) OR HIGHER *CHILLS OR SWEATING *NAUSEA AND VOMITING THAT IS NOT CONTROLLED WITH YOUR NAUSEA MEDICATION *UNUSUAL SHORTNESS OF BREATH *UNUSUAL BRUISING OR BLEEDING *URINARY PROBLEMS (pain or burning when urinating, or frequent urination) *BOWEL PROBLEMS (unusual diarrhea, constipation, pain near the anus) TENDERNESS IN MOUTH AND THROAT WITH OR WITHOUT PRESENCE OF ULCERS (sore throat, sores in mouth, or a toothache) UNUSUAL RASH, SWELLING OR PAIN  UNUSUAL VAGINAL DISCHARGE OR ITCHING   Items with * indicate a potential emergency and should be followed up as soon as possible or go to the Emergency Department if any problems should occur.  Please show the CHEMOTHERAPY ALERT CARD or IMMUNOTHERAPY ALERT CARD at check-in  to the Emergency Department and triage nurse.  Should you have questions after your visit or need to cancel or reschedule your appointment, please contact [facility]  Dept: [phone]  and follow the prompts.  Office hours are 8:00 a.m. to 4:30 p.m. Monday - Friday. Please note that voicemails left after 4:00 p.m. may not be returned until the following business day.  We are closed weekends and major holidays. You have access to a nurse at all times for urgent questions. Please call the main number to the clinic Dept: [phone] and follow the prompts.   For any non-urgent questions, you may also contact your provider using MyChart. We now offer e-Visits for anyone 18 and older to request care online for non-urgent symptoms. For details visit [email or web address].   Also download the MyChart app! Go to the app store, search "MyChart", open the app, select , and log in with your MyChart username and password.  Due to Covid, a mask is required upon entering the hospital/clinic. If you do not have a mask, one will be given to you upon arrival. For doctor visits, patients may have 1 support person aged 18 or older with them. For treatment visits, patients cannot have anyone with them due to current Covid guidelines and our immunocompromised population.   

## 2022-01-23 NOTE — Telephone Encounter (Signed)
Called and given below message. She verbalized understanding. 

## 2022-01-23 NOTE — Assessment & Plan Note (Signed)
This is likely anemia of chronic disease. The patient denies recent history of bleeding such as epistaxis, hematuria or hematochezia. She is asymptomatic from the anemia. We will observe for now.  ?She will also continue B12 injection when due ?

## 2022-01-23 NOTE — Assessment & Plan Note (Signed)
The requirement for Synthroid has reduced since discontinuation of pembrolizumab ?I will refill her prescription and adjust the dose of Synthroid as needed ?

## 2022-01-23 NOTE — Telephone Encounter (Signed)
-----   Message from [name], MD sent at 01/23/2022  3:04 PM EDT ----- ?I refilled her synthroid ?Only 30 days due to frequent changes to her dose ? ?

## 2022-01-23 NOTE — Progress Notes (Signed)
[facility] ?OFFICE PROGRESS NOTE ? ?Patient Care Team: ?Default, Provider, MD as PCP - General ? ?ASSESSMENT & PLAN:  ?Malignant neoplasm of cervix ([place]) ?So far, she tolerated single agent Taxol without difficulties ?Her blood count looks stable ?We will continue modified treatment as scheduled ?We will monitor her blood counts closely ?I plan to repeat imaging study in a few weeks ? ?Deficiency anemia ?This is likely anemia of chronic disease. The patient denies recent history of bleeding such as epistaxis, hematuria or hematochezia. She is asymptomatic from the anemia. We will observe for now.  ?She will also continue B12 injection when due ? ?Acquired hypothyroidism ?The requirement for Synthroid has reduced since discontinuation of pembrolizumab ?I will refill her prescription and adjust the dose of Synthroid as needed ? ?Orders Placed This Encounter  ?Procedures  ? CT ABDOMEN PELVIS W CONTRAST  ?  Standing Status:   Future  ?  Standing Expiration Date:   01/24/2023  ?  Order Specific Question:   If indicated for the ordered procedure, I authorize the administration of contrast media per Radiology protocol  ?  Answer:   Yes  ?  Order Specific Question:   Preferred imaging location?  ?  Answer:   [facility]  ?  Order Specific Question:   Radiology Contrast Protocol - do NOT remove file path  ?  Answer:   [email or web address]  ? Vitamin B12  ?  Standing Status:   Standing  ?  Number of Occurrences:   2  ?  Standing Expiration Date:   01/24/2023  ? ? ?All questions were answered. The patient knows to call the clinic with any problems, questions or concerns. ?The total time spent in the appointment was 20 minutes encounter with patients including review of chart and various tests results, discussions about plan of care and coordination of care plan ?  ?[name], MD ?01/23/2022 12:33 PM ? ?INTERVAL HISTORY: ?Please see below for problem oriented  charting. ?she returns for treatment follow-up on single agent Taxol ?She denies peripheral neuropathy from treatment ?No recent pelvic pain or vaginal bleeding ? ?REVIEW OF SYSTEMS:   ?Constitutional: Denies fevers, chills or abnormal weight loss ?Eyes: Denies blurriness of vision ?Ears, nose, mouth, throat, and face: Denies mucositis or sore throat ?Respiratory: Denies cough, dyspnea or wheezes ?Cardiovascular: Denies palpitation, chest discomfort or lower extremity swelling ?Gastrointestinal:  Denies nausea, heartburn or change in bowel habits ?Skin: Denies abnormal skin rashes ?Lymphatics: Denies new lymphadenopathy or easy bruising ?Neurological:Denies numbness, tingling or new weaknesses ?Behavioral/Psych: Mood is stable, no new changes  ?All other systems were reviewed with the patient and are negative. ? ?I have reviewed the past medical history, past surgical history, social history and family history with the patient and they are unchanged from previous note. ? ?ALLERGIES:  has No Known Allergies. ? ?MEDICATIONS:  ?Current Outpatient Medications  ?Medication Sig Dispense Refill  ? levothyroxine (SYNTHROID) 50 MCG tablet TAKE 1 TABLET(50 MCG) BY MOUTH DAILY BEFORE AND BREAKFAST 90 tablet 1  ? lidocaine-prilocaine (EMLA) cream Apply to affected area once 30 g 3  ? Multiple Vitamin (MULTIVITAMIN) tablet Take 1 tablet by mouth daily.    ? ondansetron (ZOFRAN) 8 MG tablet Take 1 tablet (8 mg total) by mouth 2 (two) times daily as needed (Nausea or vomiting). 30 tablet 1  ? prochlorperazine (COMPAZINE) 10 MG tablet Take 1 tablet (10 mg total) by mouth every 6 (six) hours as needed (Nausea or vomiting). 30 tablet 1  ? ?  No current facility-administered medications for this visit.  ? ?Facility-Administered Medications Ordered in Other Visits  ?Medication Dose Route Frequency Provider Last Rate Last Admin  ? heparin lock flush 100 unit/mL  500 Units Intracatheter Once PRN , , MD      ? PACLitaxel (TAXOL)  108 mg in sodium chloride 0.9 % 250 mL chemo infusion (</= 80mg/m2)  80 mg/m2 (Treatment Plan Recorded) Intravenous Once , , MD 268 mL/hr at 01/23/22 1158 108 mg at 01/23/22 1158  ? sodium chloride flush (NS) 0.9 % injection 10 mL  10 mL Intracatheter PRN , , MD      ? ? ?SUMMARY OF ONCOLOGIC HISTORY: ?Oncology History Overview Note  ?Cancer Staging ?Malignant neoplasm of cervix (HCC) ?Staging form: Cervix Uteri, AJCC 8th Edition ?- Clinical: Stage IIIB (cT3b, cN1, cM0) - Signed by , , MD on 05/04/2018 ? ?PD-L1 1% ?Progressed on carboplatin, pembrolizumab and cisplatin ?  ?Malignant neoplasm of cervix (HCC)  ?03/13/2018 Imaging  ? US pelvis ?There are mobile echogenic foci within the endometrial cavity suggestive of gas. This is nonspecific in etiology however may be secondary to endometritis. Correlate for history of recent instrumentation. This obscures visualization of the endometrial tissue and therefore a follow-up pelvic ultrasound in 2-4 weeks is recommended after resolution of the acute symptomatology if symptoms persist to further evaluate the endometrium. ?  ? ?  ?03/13/2018 Initial Diagnosis  ? She initially presented with PMB ? ?  ?04/12/2018 Pathology Results  ? Cervix, biopsy, mass ?- INVASIVE SQUAMOUS CELL CARCINOMA ?- SEE COMMENT ? ?  ?04/12/2018 Surgery  ? PREOPERATIVE DIAGNOSIS:  Postmenopausal vaginal bleeding. ?POSTOPERATIVE DIAGNOSIS: The same ?PROCEDURE: Exam under anesthesia, pap smear, cervical mass biopsy ?SURGEON:  Dr. Peggy Constant ?  ?INDICATIONS: 71 y.o. yo [id number] with PMB here for exam under anesthesia.Risks of surgery were discussed with the patient including but not limited to: bleeding which may require transfusion; infection which may require antibiotics; injury to uterus or surrounding organs; need for additional procedures including laparotomy or laparoscopy; and other postoperative/anesthesia complications. Written informed consent was obtained.   ?   ?FINDINGS:  An 8-week size midline uterus.  No adnexal mass palpable on exam. Normal cervix not visualized. Friable mass seen in involving the vagina at the level of the cervix obliterating the anterior and posterior fornix. ?  ?ANESTHESIA:   General ?INTRAVENOUS FLUIDS:  300 ml of LR ?ESTIMATED BLOOD LOSS: 20 ml. ?SPECIMENS: pap smear, cervical mass biopsy ?COMPLICATIONS:  None immediate. ?  ? ?  ?04/25/2018 PET scan  ? 1. Large cervical mass may invade into the myometrium in down into the vagina, maximum SUV 21.3. There is a malignant left external iliac node measuring 1.4 cm in short axis with maximum SUV 14.1. ?2. Accentuated activity in the cecum and ascending colon is likely physiologic given that it has no CT correlate. Correlation with the patient's colon cancer screening history is recommended. If screening is not up-to-date, appropriate screening should be considered. ?3.  Aortic Atherosclerosis (ICD10-I70.0). ?  ? ?  ?04/28/2018 Surgery  ? Pre-operative Diagnosis:  At least Stage 2B SCCa Cervical Cancer ?  ?Post-operative Diagnosis: At least clinical Stage 3A SCCa Cervical Cancer ?  ?Operation:  ?Exam under anesthesia due to intolerance for vaginal exam in office ?Cystoscopy due to concern for extension to posterior bladder (from anterior vaginal wall lesion) ?  ?Operative Findings:   ?Parametrial involvement on right  ?Right sided subvaginal tumor burden ~3cm. This approximates the right ureteral insertion into the   bladder based on my exam during cystoscopy. ?Extension of disease down upper half of vagina on lateral and posterior walls. ?Extension of disease down to lower third of anterior vagina (versus skip lesion) thus making her Stage 3A ?Cystoscopy revealed patent bilateral ureteral orifices and no bladder mucosa invasion or areas of concern. ?Rectal exam grossly no disease. ?  ?  ?05/04/2018 Cancer Staging  ? Staging form: Cervix Uteri, AJCC 8th Edition ?- Clinical: Stage IIIB (cT3b, cN1, cM0) -  Signed by , , MD on 05/04/2018 ? ?  ?05/12/2018 Procedure  ? Successful 8 French right internal jugular vein power port placement with its tip at the SVC/RA junction ? ?  ?05/13/2018 - 06/24/2018 Chemotherapy  ? The

## 2022-01-25 ENCOUNTER — Other Ambulatory Visit: Payer: Self-pay | Admitting: Hematology and Oncology

## 2022-01-26 ENCOUNTER — Encounter: Payer: Self-pay | Admitting: Hematology and Oncology

## 2022-01-29 MED FILL — Dexamethasone Sodium Phosphate Inj 100 MG/10ML: INTRAMUSCULAR | Qty: 1 | Status: AC

## 2022-01-30 ENCOUNTER — Inpatient Hospital Stay: Payer: Medicare Other

## 2022-01-30 ENCOUNTER — Other Ambulatory Visit: Payer: Self-pay

## 2022-01-30 VITALS — BP 113/91 | HR 76 | Temp 99.2°F | Resp 17 | Wt 88.5 lb

## 2022-01-30 DIAGNOSIS — C539 Malignant neoplasm of cervix uteri, unspecified: Secondary | ICD-10-CM

## 2022-01-30 DIAGNOSIS — D61818 Other pancytopenia: Secondary | ICD-10-CM

## 2022-01-30 DIAGNOSIS — Z7189 Other specified counseling: Secondary | ICD-10-CM

## 2022-01-30 DIAGNOSIS — E538 Deficiency of other specified B group vitamins: Secondary | ICD-10-CM

## 2022-01-30 DIAGNOSIS — Z5111 Encounter for antineoplastic chemotherapy: Secondary | ICD-10-CM | POA: Diagnosis not present

## 2022-01-30 LAB — CBC WITH DIFFERENTIAL (CANCER CENTER ONLY)
Abs Immature Granulocytes: 0.09 10*3/uL — ABNORMAL HIGH (ref 0.00–0.07)
Basophils Absolute: 0 10*3/uL (ref 0.0–0.1)
Basophils Relative: 1 %
Eosinophils Absolute: 0.1 10*3/uL (ref 0.0–0.5)
Eosinophils Relative: 2 %
HCT: 31.5 % — ABNORMAL LOW (ref 36.0–46.0)
Hemoglobin: 10.5 g/dL — ABNORMAL LOW (ref 12.0–15.0)
Immature Granulocytes: 2 %
Lymphocytes Relative: 19 %
Lymphs Abs: 1 10*3/uL (ref 0.7–4.0)
MCH: 31.1 pg (ref 26.0–34.0)
MCHC: 33.3 g/dL (ref 30.0–36.0)
MCV: 93.2 fL (ref 80.0–100.0)
Monocytes Absolute: 0.5 10*3/uL (ref 0.1–1.0)
Monocytes Relative: 9 %
Neutro Abs: 3.3 10*3/uL (ref 1.7–7.7)
Neutrophils Relative %: 67 %
Platelet Count: 210 10*3/uL (ref 150–400)
RBC: 3.38 MIL/uL — ABNORMAL LOW (ref 3.87–5.11)
RDW: 13.9 % (ref 11.5–15.5)
WBC Count: 4.9 10*3/uL (ref 4.0–10.5)
nRBC: 0 % (ref 0.0–0.2)

## 2022-01-30 LAB — CMP (CANCER CENTER ONLY)
ALT: 9 U/L (ref 0–44)
AST: 17 U/L (ref 15–41)
Albumin: 4.1 g/dL (ref 3.5–5.0)
Alkaline Phosphatase: 48 U/L (ref 38–126)
Anion gap: 5 (ref 5–15)
BUN: 19 mg/dL (ref 8–23)
CO2: 29 mmol/L (ref 22–32)
Calcium: 9.2 mg/dL (ref 8.9–10.3)
Chloride: 105 mmol/L (ref 98–111)
Creatinine: 1.21 mg/dL — ABNORMAL HIGH (ref 0.44–1.00)
GFR, Estimated: 49 mL/min — ABNORMAL LOW (ref >60)
Glucose, Bld: 93 mg/dL (ref 70–99)
Potassium: 4.2 mmol/L (ref 3.5–5.1)
Sodium: 139 mmol/L (ref 135–145)
Total Bilirubin: 0.3 mg/dL (ref 0.3–1.2)
Total Protein: 7.1 g/dL (ref 6.5–8.1)

## 2022-01-30 LAB — VITAMIN B12: Vitamin B-12: 234 pg/mL (ref 180–914)

## 2022-01-30 MED ORDER — HEPARIN SOD (PORK) LOCK FLUSH 100 UNIT/ML IV SOLN
500.0000 [IU] | Freq: Once | INTRAVENOUS | Status: AC | PRN
  Administered 2022-01-30: 500 [IU]

## 2022-01-30 MED ORDER — SODIUM CHLORIDE 0.9% FLUSH
10.0000 mL | INTRAVENOUS | Status: DC | PRN
  Administered 2022-01-30: 10 mL

## 2022-01-30 MED ORDER — DIPHENHYDRAMINE HCL 50 MG/ML IJ SOLN
50.0000 mg | Freq: Once | INTRAMUSCULAR | Status: AC
  Administered 2022-01-30: 50 mg via INTRAVENOUS
  Filled 2022-01-30: qty 1

## 2022-01-30 MED ORDER — SODIUM CHLORIDE 0.9 % IV SOLN: Freq: Once | INTRAVENOUS | Status: AC

## 2022-01-30 MED ORDER — SODIUM CHLORIDE 0.9 % IV SOLN
80.0000 mg/m2 | Freq: Once | INTRAVENOUS | Status: AC
  Administered 2022-01-30: 108 mg via INTRAVENOUS
  Filled 2022-01-30: qty 18

## 2022-01-30 MED ORDER — SODIUM CHLORIDE 0.9% FLUSH
10.0000 mL | Freq: Once | INTRAVENOUS | Status: AC
  Administered 2022-01-30: 10 mL

## 2022-01-30 MED ORDER — SODIUM CHLORIDE 0.9 % IV SOLN
10.0000 mg | Freq: Once | INTRAVENOUS | Status: AC
  Administered 2022-01-30: 10 mg via INTRAVENOUS
  Filled 2022-01-30: qty 10

## 2022-01-30 MED ORDER — FAMOTIDINE IN NACL 20-0.9 MG/50ML-% IV SOLN
20.0000 mg | Freq: Once | INTRAVENOUS | Status: AC
  Administered 2022-01-30: 20 mg via INTRAVENOUS
  Filled 2022-01-30: qty 50

## 2022-01-30 NOTE — Patient Instructions (Signed)
[facility]   Discharge Instructions: Thank you for choosing [facility] to provide your oncology and hematology care.   If you have a lab appointment with the Cancer Center, please go directly to the Cancer Center and check in at the registration area.   Wear comfortable clothing and clothing appropriate for easy access to any Portacath or PICC line.   We strive to give you quality time with your provider. You may need to reschedule your appointment if you arrive late (15 or more minutes).  Arriving late affects you and other patients whose appointments are after yours.  Also, if you miss three or more appointments without notifying the office, you may be dismissed from the clinic at the provider's discretion.      For prescription refill requests, have your pharmacy contact our office and allow 72 hours for refills to be completed.    Today you received the following chemotherapy and/or immunotherapy agents: paclitaxel.      To help prevent nausea and vomiting after your treatment, we encourage you to take your nausea medication as directed.  BELOW ARE SYMPTOMS THAT SHOULD BE REPORTED IMMEDIATELY: *FEVER GREATER THAN 100.4 F (38 C) OR HIGHER *CHILLS OR SWEATING *NAUSEA AND VOMITING THAT IS NOT CONTROLLED WITH YOUR NAUSEA MEDICATION *UNUSUAL SHORTNESS OF BREATH *UNUSUAL BRUISING OR BLEEDING *URINARY PROBLEMS (pain or burning when urinating, or frequent urination) *BOWEL PROBLEMS (unusual diarrhea, constipation, pain near the anus) TENDERNESS IN MOUTH AND THROAT WITH OR WITHOUT PRESENCE OF ULCERS (sore throat, sores in mouth, or a toothache) UNUSUAL RASH, SWELLING OR PAIN  UNUSUAL VAGINAL DISCHARGE OR ITCHING   Items with * indicate a potential emergency and should be followed up as soon as possible or go to the Emergency Department if any problems should occur.  Please show the CHEMOTHERAPY ALERT CARD or IMMUNOTHERAPY ALERT CARD at check-in  to the Emergency Department and triage nurse.  Should you have questions after your visit or need to cancel or reschedule your appointment, please contact [facility]  Dept: [phone]  and follow the prompts.  Office hours are 8:00 a.m. to 4:30 p.m. Monday - Friday. Please note that voicemails left after 4:00 p.m. may not be returned until the following business day.  We are closed weekends and major holidays. You have access to a nurse at all times for urgent questions. Please call the main number to the clinic Dept: [phone] and follow the prompts.   For any non-urgent questions, you may also contact your provider using MyChart. We now offer e-Visits for anyone 18 and older to request care online for non-urgent symptoms. For details visit [email or web address].   Also download the MyChart app! Go to the app store, search "MyChart", open the app, select [facility], and log in with your MyChart username and password.  Due to Covid, a mask is required upon entering the hospital/clinic. If you do not have a mask, one will be given to you upon arrival. For doctor visits, patients may have 1 support person aged 18 or older with them. For treatment visits, patients cannot have anyone with them due to current Covid guidelines and our immunocompromised population.   

## 2022-02-05 ENCOUNTER — Ambulatory Visit (HOSPITAL_COMMUNITY)
Admission: RE | Admit: 2022-02-05 | Discharge: 2022-02-05 | Disposition: A | Discharge status: Home / Self Care | Payer: Medicare Other | Source: Ambulatory Visit | Attending: Hematology and Oncology | Admitting: Hematology and Oncology

## 2022-02-05 DIAGNOSIS — C539 Malignant neoplasm of cervix uteri, unspecified: Secondary | ICD-10-CM | POA: Insufficient documentation

## 2022-02-05 MED ORDER — IOHEXOL 300 MG/ML  SOLN
75.0000 mL | Freq: Once | INTRAMUSCULAR | Status: AC | PRN
  Administered 2022-02-05: 75 mL via INTRAVENOUS

## 2022-02-05 MED ORDER — HEPARIN SOD (PORK) LOCK FLUSH 100 UNIT/ML IV SOLN
INTRAVENOUS | Status: AC
  Administered 2022-02-05: 500 [IU] via INTRAVENOUS
  Filled 2022-02-05: qty 5

## 2022-02-05 MED ORDER — SODIUM CHLORIDE (PF) 0.9 % IJ SOLN
INTRAMUSCULAR | Status: AC
  Filled 2022-02-05: qty 50

## 2022-02-05 MED ORDER — HEPARIN SOD (PORK) LOCK FLUSH 100 UNIT/ML IV SOLN: 500.0000 [IU] | Freq: Once | INTRAVENOUS | Status: AC

## 2022-02-12 MED FILL — Dexamethasone Sodium Phosphate Inj 100 MG/10ML: INTRAMUSCULAR | Qty: 1 | Status: AC

## 2022-02-13 ENCOUNTER — Inpatient Hospital Stay: Payer: Medicare Other

## 2022-02-13 ENCOUNTER — Encounter: Payer: Self-pay | Admitting: Hematology and Oncology

## 2022-02-13 ENCOUNTER — Inpatient Hospital Stay (HOSPITAL_BASED_OUTPATIENT_CLINIC_OR_DEPARTMENT_OTHER): Payer: Medicare Other | Admitting: Hematology and Oncology

## 2022-02-13 ENCOUNTER — Inpatient Hospital Stay: Payer: Medicare Other | Attending: Hematology and Oncology

## 2022-02-13 ENCOUNTER — Other Ambulatory Visit: Payer: Self-pay

## 2022-02-13 DIAGNOSIS — I700 Atherosclerosis of aorta: Secondary | ICD-10-CM | POA: Insufficient documentation

## 2022-02-13 DIAGNOSIS — Z5111 Encounter for antineoplastic chemotherapy: Secondary | ICD-10-CM | POA: Diagnosis not present

## 2022-02-13 DIAGNOSIS — C539 Malignant neoplasm of cervix uteri, unspecified: Secondary | ICD-10-CM | POA: Insufficient documentation

## 2022-02-13 DIAGNOSIS — E538 Deficiency of other specified B group vitamins: Secondary | ICD-10-CM | POA: Diagnosis not present

## 2022-02-13 DIAGNOSIS — Z79899 Other long term (current) drug therapy: Secondary | ICD-10-CM | POA: Insufficient documentation

## 2022-02-13 DIAGNOSIS — Z7189 Other specified counseling: Secondary | ICD-10-CM

## 2022-02-13 DIAGNOSIS — D61818 Other pancytopenia: Secondary | ICD-10-CM

## 2022-02-13 DIAGNOSIS — N1330 Unspecified hydronephrosis: Secondary | ICD-10-CM | POA: Insufficient documentation

## 2022-02-13 DIAGNOSIS — D6481 Anemia due to antineoplastic chemotherapy: Secondary | ICD-10-CM | POA: Diagnosis not present

## 2022-02-13 LAB — CMP (CANCER CENTER ONLY)
ALT: 11 U/L (ref 0–44)
AST: 19 U/L (ref 15–41)
Albumin: 4.3 g/dL (ref 3.5–5.0)
Alkaline Phosphatase: 52 U/L (ref 38–126)
Anion gap: 5 (ref 5–15)
BUN: 22 mg/dL (ref 8–23)
CO2: 27 mmol/L (ref 22–32)
Calcium: 9.2 mg/dL (ref 8.9–10.3)
Chloride: 109 mmol/L (ref 98–111)
Creatinine: 1.15 mg/dL — ABNORMAL HIGH (ref 0.44–1.00)
GFR, Estimated: 52 mL/min — ABNORMAL LOW (ref >60)
Glucose, Bld: 95 mg/dL (ref 70–99)
Potassium: 4.1 mmol/L (ref 3.5–5.1)
Sodium: 141 mmol/L (ref 135–145)
Total Bilirubin: 0.3 mg/dL (ref 0.3–1.2)
Total Protein: 6.9 g/dL (ref 6.5–8.1)

## 2022-02-13 LAB — CBC WITH DIFFERENTIAL (CANCER CENTER ONLY)
Abs Immature Granulocytes: 0.07 10*3/uL (ref 0.00–0.07)
Basophils Absolute: 0 10*3/uL (ref 0.0–0.1)
Basophils Relative: 1 %
Eosinophils Absolute: 0.1 10*3/uL (ref 0.0–0.5)
Eosinophils Relative: 1 %
HCT: 31.9 % — ABNORMAL LOW (ref 36.0–46.0)
Hemoglobin: 10.3 g/dL — ABNORMAL LOW (ref 12.0–15.0)
Immature Granulocytes: 1 %
Lymphocytes Relative: 18 %
Lymphs Abs: 0.9 10*3/uL (ref 0.7–4.0)
MCH: 30.7 pg (ref 26.0–34.0)
MCHC: 32.3 g/dL (ref 30.0–36.0)
MCV: 94.9 fL (ref 80.0–100.0)
Monocytes Absolute: 0.6 10*3/uL (ref 0.1–1.0)
Monocytes Relative: 12 %
Neutro Abs: 3.3 10*3/uL (ref 1.7–7.7)
Neutrophils Relative %: 67 %
Platelet Count: 200 10*3/uL (ref 150–400)
RBC: 3.36 MIL/uL — ABNORMAL LOW (ref 3.87–5.11)
RDW: 14.7 % (ref 11.5–15.5)
WBC Count: 5 10*3/uL (ref 4.0–10.5)
nRBC: 0 % (ref 0.0–0.2)

## 2022-02-13 MED ORDER — SODIUM CHLORIDE 0.9 % IV SOLN
10.0000 mg | Freq: Once | INTRAVENOUS | Status: AC
  Administered 2022-02-13: 10 mg via INTRAVENOUS
  Filled 2022-02-13: qty 10

## 2022-02-13 MED ORDER — FAMOTIDINE IN NACL 20-0.9 MG/50ML-% IV SOLN
20.0000 mg | Freq: Once | INTRAVENOUS | Status: AC
  Administered 2022-02-13: 20 mg via INTRAVENOUS
  Filled 2022-02-13: qty 50

## 2022-02-13 MED ORDER — CYANOCOBALAMIN 1000 MCG/ML IJ SOLN
1000.0000 ug | Freq: Once | INTRAMUSCULAR | Status: AC
  Administered 2022-02-13: 1000 ug via INTRAMUSCULAR
  Filled 2022-02-13: qty 1

## 2022-02-13 MED ORDER — SODIUM CHLORIDE 0.9% FLUSH
10.0000 mL | INTRAVENOUS | Status: DC | PRN
  Administered 2022-02-13: 10 mL

## 2022-02-13 MED ORDER — SODIUM CHLORIDE 0.9 % IV SOLN
80.0000 mg/m2 | Freq: Once | INTRAVENOUS | Status: AC
  Administered 2022-02-13: 108 mg via INTRAVENOUS
  Filled 2022-02-13: qty 18

## 2022-02-13 MED ORDER — HEPARIN SOD (PORK) LOCK FLUSH 100 UNIT/ML IV SOLN
500.0000 [IU] | Freq: Once | INTRAVENOUS | Status: AC | PRN
  Administered 2022-02-13: 500 [IU]

## 2022-02-13 MED ORDER — SODIUM CHLORIDE 0.9 % IV SOLN: Freq: Once | INTRAVENOUS | Status: AC

## 2022-02-13 MED ORDER — DIPHENHYDRAMINE HCL 50 MG/ML IJ SOLN
50.0000 mg | Freq: Once | INTRAMUSCULAR | Status: AC
  Administered 2022-02-13: 50 mg via INTRAVENOUS
  Filled 2022-02-13: qty 1

## 2022-02-13 MED ORDER — SODIUM CHLORIDE 0.9% FLUSH
10.0000 mL | Freq: Once | INTRAVENOUS | Status: AC
  Administered 2022-02-13: 10 mL

## 2022-02-13 NOTE — Assessment & Plan Note (Signed)
She has compromised renal function due to hydronephrosis but overall asymptomatic I do not recommend stent placement long-term 

## 2022-02-13 NOTE — Patient Instructions (Signed)
[facility]  Discharge Instructions: Thank you for choosing [facility] to provide your oncology and hematology care.   If you have a lab appointment with the Cancer Center, please go directly to the Cancer Center and check in at the registration area.   Wear comfortable clothing and clothing appropriate for easy access to any Portacath or PICC line.   We strive to give you quality time with your provider. You may need to reschedule your appointment if you arrive late (15 or more minutes).  Arriving late affects you and other patients whose appointments are after yours.  Also, if you miss three or more appointments without notifying the office, you may be dismissed from the clinic at the provider's discretion.      For prescription refill requests, have your pharmacy contact our office and allow 72 hours for refills to be completed.    Today you received the following chemotherapy and/or immunotherapy agent: Paclitaxel (Taxol)   To help prevent nausea and vomiting after your treatment, we encourage you to take your nausea medication as directed.  BELOW ARE SYMPTOMS THAT SHOULD BE REPORTED IMMEDIATELY: *FEVER GREATER THAN 100.4 F (38 C) OR HIGHER *CHILLS OR SWEATING *NAUSEA AND VOMITING THAT IS NOT CONTROLLED WITH YOUR NAUSEA MEDICATION *UNUSUAL SHORTNESS OF BREATH *UNUSUAL BRUISING OR BLEEDING *URINARY PROBLEMS (pain or burning when urinating, or frequent urination) *BOWEL PROBLEMS (unusual diarrhea, constipation, pain near the anus) TENDERNESS IN MOUTH AND THROAT WITH OR WITHOUT PRESENCE OF ULCERS (sore throat, sores in mouth, or a toothache) UNUSUAL RASH, SWELLING OR PAIN  UNUSUAL VAGINAL DISCHARGE OR ITCHING   Items with * indicate a potential emergency and should be followed up as soon as possible or go to the Emergency Department if any problems should occur.  Please show the CHEMOTHERAPY ALERT CARD or IMMUNOTHERAPY ALERT CARD at  check-in to the Emergency Department and triage nurse.  Should you have questions after your visit or need to cancel or reschedule your appointment, please contact [facility]  Dept: [phone]  and follow the prompts.  Office hours are 8:00 a.m. to 4:30 p.m. Monday - Friday. Please note that voicemails left after 4:00 p.m. may not be returned until the following business day.  We are closed weekends and major holidays. You have access to a nurse at all times for urgent questions. Please call the main number to the clinic Dept: [phone] and follow the prompts.   For any non-urgent questions, you may also contact your provider using MyChart. We now offer e-Visits for anyone 18 and older to request care online for non-urgent symptoms. For details visit [email or web address].   Also download the MyChart app! Go to the app store, search "MyChart", open the app, select [facility], and log in with your MyChart username and password.  Due to Covid, a mask is required upon entering the hospital/clinic. If you do not have a mask, one will be given to you upon arrival. For doctor visits, patients may have 1 support person aged 18 or older with them. For treatment visits, patients cannot have anyone with them due to current Covid guidelines and our immunocompromised population.  

## 2022-02-13 NOTE — Assessment & Plan Note (Signed)
So far, she tolerated single agent Taxol without difficulties Her blood count looks stable We will continue modified treatment as scheduled CT imaging show positive response to treatment She will continue her treatment as scheduled I plan to repeat imaging study again end of September 

## 2022-02-13 NOTE — Progress Notes (Signed)
[facility] OFFICE PROGRESS NOTE  Patient Care Team: Default, Provider, MD as PCP - General  ASSESSMENT & PLAN:  Malignant neoplasm of cervix ([place]) So far, she tolerated single agent Taxol without difficulties Her blood count looks stable We will continue modified treatment as scheduled CT imaging show positive response to treatment She will continue her treatment as scheduled I plan to repeat imaging study again end of September  Vitamin B12 deficiency We will continue B12 injections monthly I plan to monitor her B12 closely, repeat level is slightly better  Hydronephrosis of right kidney She has compromised renal function due to hydronephrosis but overall asymptomatic I do not recommend stent placement long-term  No orders of the defined types were placed in this encounter.   All questions were answered. The patient knows to call the clinic with any problems, questions or concerns. The total time spent in the appointment was 30 minutes encounter with patients including review of chart and various tests results, discussions about plan of care and coordination of care plan   [name], MD 02/13/2022 12:09 PM  INTERVAL HISTORY: Please see below for problem oriented charting. she returns for treatment follow-up on single agent Taxol She denies worsening peripheral neuropathy No recent vaginal bleeding or pelvic pain  REVIEW OF SYSTEMS:   Constitutional: Denies fevers, chills or abnormal weight loss Eyes: Denies blurriness of vision Ears, nose, mouth, throat, and face: Denies mucositis or sore throat Respiratory: Denies cough, dyspnea or wheezes Cardiovascular: Denies palpitation, chest discomfort or lower extremity swelling Gastrointestinal:  Denies nausea, heartburn or change in bowel habits Skin: Denies abnormal skin rashes Lymphatics: Denies new lymphadenopathy or easy bruising Neurological:Denies numbness, tingling or new weaknesses Behavioral/Psych:  Mood is stable, no new changes  All other systems were reviewed with the patient and are negative.  I have reviewed the past medical history, past surgical history, social history and family history with the patient and they are unchanged from previous note.  ALLERGIES:  has No Known Allergies.  MEDICATIONS:  Current Outpatient Medications  Medication Sig Dispense Refill   levothyroxine (SYNTHROID) 50 MCG tablet TAKE 1 TABLET(50 MCG) BY MOUTH DAILY BEFORE AND BREAKFAST 30 tablet 1   lidocaine-prilocaine (EMLA) cream Apply to affected area once 30 g 3   Multiple Vitamin (MULTIVITAMIN) tablet Take 1 tablet by mouth daily.     ondansetron (ZOFRAN) 8 MG tablet Take 1 tablet (8 mg total) by mouth 2 (two) times daily as needed (Nausea or vomiting). 30 tablet 1   prochlorperazine (COMPAZINE) 10 MG tablet Take 1 tablet (10 mg total) by mouth every 6 (six) hours as needed (Nausea or vomiting). 30 tablet 1   No current facility-administered medications for this visit.   Facility-Administered Medications Ordered in Other Visits  Medication Dose Route Frequency Provider Last Rate Last Admin   dexamethasone (DECADRON) 10 mg in sodium chloride 0.9 % 50 mL IVPB  10 mg Intravenous Once Alvy Bimler, Delora Gravatt, MD       famotidine (PEPCID) IVPB 20 mg premix  20 mg Intravenous Once Alvy Bimler, Ahyana Skillin, MD 200 mL/hr at 02/13/22 1206 20 mg at 02/13/22 1206   heparin lock flush 100 unit/mL  500 Units Intracatheter Once PRN [name], MD       PACLitaxel (TAXOL) 108 mg in sodium chloride 0.9 % 250 mL chemo infusion (</= 82m/m2)  80 mg/m2 (Treatment Plan Recorded) Intravenous Once Melicia Esqueda, MD       sodium chloride flush (NS) 0.9 % injection 10 mL  10 mL Intracatheter PRN [name], MD        SUMMARY OF ONCOLOGIC HISTORY: Oncology History Overview Note  Cancer Staging Malignant neoplasm of cervix ([place]) Staging form: Cervix Uteri, AJCC 8th Edition - Clinical: Stage IIIB (cT3b, cN1, cM0) - Signed by [name], MD on  05/04/2018  PD-L1 1% Progressed on carboplatin, pembrolizumab and cisplatin   Malignant neoplasm of cervix ([place])  03/13/2018 Imaging   US pelvis There are mobile echogenic foci within the endometrial cavity suggestive of gas. This is nonspecific in etiology however may be secondary to endometritis. Correlate for history of recent instrumentation. This obscures visualization of the endometrial tissue and therefore a follow-up pelvic ultrasound in 2-4 weeks is recommended after resolution of the acute symptomatology if symptoms persist to further evaluate the endometrium.      03/13/2018 Initial Diagnosis   She initially presented with PMB    04/12/2018 Pathology Results   Cervix, biopsy, mass - INVASIVE SQUAMOUS CELL CARCINOMA - SEE COMMENT    04/12/2018 Surgery   PREOPERATIVE DIAGNOSIS:  Postmenopausal vaginal bleeding. POSTOPERATIVE DIAGNOSIS: The same PROCEDURE: Exam under anesthesia, pap smear, cervical mass biopsy SURGEON:  Dr. [name]   INDICATIONS: 71 y.o. yo [id number] with PMB here for exam under anesthesia.Risks of surgery were discussed with the patient including but not limited to: bleeding which may require transfusion; infection which may require antibiotics; injury to uterus or surrounding organs; need for additional procedures including laparotomy or laparoscopy; and other postoperative/anesthesia complications. Written informed consent was obtained.     FINDINGS:  An 8-week size midline uterus.  No adnexal mass palpable on exam. Normal cervix not visualized. Friable mass seen in involving the vagina at the level of the cervix obliterating the anterior and posterior fornix.   ANESTHESIA:   General INTRAVENOUS FLUIDS:  300 ml of LR ESTIMATED BLOOD LOSS: 20 ml. SPECIMENS: pap smear, cervical mass biopsy COMPLICATIONS:  None immediate.      04/25/2018 PET scan   1. Large cervical mass may invade into the myometrium in down into the vagina, maximum SUV 21.3. There is  a malignant left external iliac node measuring 1.4 cm in short axis with maximum SUV 14.1. 2. Accentuated activity in the cecum and ascending colon is likely physiologic given that it has no CT correlate. Correlation with the patient's colon cancer screening history is recommended. If screening is not up-to-date, appropriate screening should be considered. 3.  Aortic Atherosclerosis (ICD10-I70.0).      04/28/2018 Surgery   Pre-operative Diagnosis:  At least Stage 2B SCCa Cervical Cancer   Post-operative Diagnosis: At least clinical Stage 3A SCCa Cervical Cancer   Operation:  Exam under anesthesia due to intolerance for vaginal exam in office Cystoscopy due to concern for extension to posterior bladder (from anterior vaginal wall lesion)   Operative Findings:   Parametrial involvement on right  Right sided subvaginal tumor burden ~3cm. This approximates the right ureteral insertion into the bladder based on my exam during cystoscopy. Extension of disease down upper half of vagina on lateral and posterior walls. Extension of disease down to lower third of anterior vagina (versus skip lesion) thus making her Stage 3A Cystoscopy revealed patent bilateral ureteral orifices and no bladder mucosa invasion or areas of concern. Rectal exam grossly no disease.     05/04/2018 Cancer Staging   Staging form: Cervix Uteri, AJCC 8th Edition - Clinical: Stage IIIB (cT3b, cN1, cM0) - Signed by [name], MD on 05/04/2018    05/12/2018 Procedure  Successful 8 French right internal jugular vein power port placement with its tip at the SVC/RA junction    05/13/2018 - 06/24/2018 Chemotherapy   The patient had weekly cisplatin given with concurrent radiation     05/17/2018 - 07/27/2018 Radiation Therapy   Radiation treatment dates:   05/17/2018-07/27/2018   Site/dose: 1. Cervix, 1.8 Gy in 25 fractions for a total dose of 45 Gy                    2. Pelvis  Boost, 1.8 Gy in 5 fractions for a total dose  of 9 Gy                    3. Cervix, 5.5 Gy in 5 fractions for a total dose of 27.5 Gy    06/03/2018 Adverse Reaction   She missed her chemo due to severe depression and was sent to the ER for suicidal ideation.  She was seen by psychiatrist.    10/27/2018 PET scan   IMPRESSION: 1. Complete metabolic response to therapy, without residual or recurrent hypermetabolic disease. 2.  Aortic Atherosclerosis (ICD10-I70.0).      11/17/2018 Imaging   Successful right IJ vein Port-A-Cath explant.    03/29/2020 PET scan   1. Hypermetabolic lesion along the right vaginal cuff is highly worrisome for recurrent cervical cancer. No evidence of distant metastatic disease. 2.  Aortic atherosclerosis (ICD10-I70.0).   04/26/2020 - 12/10/2020 Chemotherapy   The patient had CARBOplatin  for chemotherapy treatment.      Genetic Testing   Patient has genetic testing done for PD-L1. Results revealed patient has the following:  PD-L1 combined positive score (CPS): 1%   07/08/2020 Imaging   1. Unchanged post treatment appearance of the pelvis, including a soft tissue nodule in the low right hemipelvis adjacent to the vagina measuring 2.8 x 1.7 cm, previously FDG avid and consistent with malignancy. 2. No evidence of nodal or distant metastatic disease in the abdomen or pelvis. 3. Aortic Atherosclerosis (ICD10-I70.0).   10/14/2020 Imaging   1. Unchanged right eccentric soft tissue mass in the low right hemipelvis centered on the cervix and vagina, measuring 2.9 x 1.9 cm. 2. New small volume fluid within the endometrial cavity, abnormal in the postmenopausal setting and suggesting obstruction of the cervical os by soft tissue mass. 3. No evidence of lymphadenopathy or distant metastatic disease in the abdomen or pelvis.   Aortic Atherosclerosis (ICD10-I70.0).   12/24/2020 Imaging   1. Abnormal hypodense masslike appearance continuous with hypodense masslike lesion probably centrally within a right eccentric  cervix (versus right paracervical mass). The endometrial portion has enlarged compared to 10/14/2020 and active malignancy is suspected. 2. No adenopathy or findings of distant spread. 3. Other imaging findings of potential clinical significance: Aortic Atherosclerosis (ICD10-I70.0). Mild cardiomegaly. Stable scarring in the left lower lobe. Sigmoid colon scattered diverticula. Tarlov cysts at the S2 level. Vertical sclerosis in the sacral ala possibly due to stress fractures or prior radiation therapy.     01/06/2021 - 06/19/2021 Chemotherapy   Patient is on Treatment Plan : UTERINE Pembrolizumab q21d      04/07/2021 Imaging   1. The previously hypermetabolic right vaginal mass currently measures 12 cubic cm in volume, previously 11 cubic cm on 12/23/2020. 2. No new adenopathy or new findings of distant metastatic spread. 3. New wall thickening in several loops of distal ileum suspicious for inflammation/enteritis. 4. Other imaging findings of potential clinical significance: Mild cardiomegaly. Mild sigmoid colon diverticulosis.  Stable bony demineralization with vertical sclerosis in the sacral ala likely from old insufficiency fracture.   07/10/2021 Imaging   1. New moderate right-sided hydroureteronephrosis with decreased perfusion to the right kidney compatible with obstructive uropathy.  2. Interval progression of the heterogeneous low-density uterine lesion, with progression of enhancing soft tissue extending inferiorly to the right, compatible with previously described disease in the right vaginal wall. Represents the site of right ureteral obstruction. 3. No evidence for new metastatic disease in the abdomen or pelvis. 4. Bilateral sacral insufficiency fractures. 5. Trace free fluid in the pelvis. 6. Aortic Atherosclerosis (ICD10-I70.0).     07/22/2021 Procedure   Successful placement of a right internal jugular approach power injectable Port-A-Cath.     07/24/2021 - 10/02/2021  Chemotherapy   Patient is on Treatment Plan : cervical cancer Cisplatin q7d       10/16/2021 Imaging   1. Mild interval increase in size of heterogeneous tumor arising from within the uterus with local tumor extension into the right pelvic sidewall. 2. Persistent right-sided obstructive uropathy with severe right hydronephrosis and progressive right renal cortical volume loss. Right hydroureter extends into the pelvis where it is obstructed by the uterine/cervical mass. 3. Bilateral sacral insufficiency fractures. 4. Aortic Atherosclerosis (ICD10-I70.0).   11/06/2021 -  Chemotherapy   Patient is on Treatment Plan : Cervical Paclitaxel q7d      02/10/2022 Imaging   1. Slight interval decrease in size of a heterogeneous mass within the right pelvis, involving the adjacent right aspect of the uterus and closely abutting the posterior urinary bladder,, consistent with treatment response. Fat stranding and presacral soft tissue thickening in the adjacent pelvis, unchanged. 2. Unchanged severe right hydronephrosis and hydroureter, the distal right ureter obstructed by mass in the pelvis, again with slight progression of right renal cortical atrophy. 3. Unchanged, mildly sclerotic nondisplaced bilateral sacral insufficiency fractures.   Aortic Atherosclerosis (ICD10-I70.0).     PHYSICAL EXAMINATION: ECOG PERFORMANCE STATUS: 0 - Asymptomatic  Vitals:   02/13/22 1103  BP: 128/82  Pulse: 88  Resp: 18  Temp: 98.5 F (36.9 C)  SpO2: 95%   Filed Weights   02/13/22 1103  Weight: 91 lb 3.2 oz (41.4 kg)    GENERAL:alert, no distress and comfortable   NEURO: alert & oriented x 3 with fluent speech, no focal motor/sensory deficits  LABORATORY DATA:  I have reviewed the data as listed    Component Value Date/Time   NA 141 02/13/2022 1036   K 4.1 02/13/2022 1036   CL 109 02/13/2022 1036   CO2 27 02/13/2022 1036   GLUCOSE 95 02/13/2022 1036   BUN 22 02/13/2022 1036   CREATININE 1.15 (H)  02/13/2022 1036   CALCIUM 9.2 02/13/2022 1036   PROT 6.9 02/13/2022 1036   ALBUMIN 4.3 02/13/2022 1036   AST 19 02/13/2022 1036   ALT 11 02/13/2022 1036   ALKPHOS 52 02/13/2022 1036   BILITOT 0.3 02/13/2022 1036   GFRNONAA 52 (L) 02/13/2022 1036   GFRAA >60 06/14/2020 0844   GFRAA >60 03/11/2020 0951    No results found for: SPEP, UPEP  Lab Results  Component Value Date   WBC 5.0 02/13/2022   NEUTROABS 3.3 02/13/2022   HGB 10.3 (L) 02/13/2022   HCT 31.9 (L) 02/13/2022   MCV 94.9 02/13/2022   PLT 200 02/13/2022      Chemistry      Component Value Date/Time   NA 141 02/13/2022 1036   K 4.1 02/13/2022 1036   CL  109 02/13/2022 1036   CO2 27 02/13/2022 1036   BUN 22 02/13/2022 1036   CREATININE 1.15 (H) 02/13/2022 1036      Component Value Date/Time   CALCIUM 9.2 02/13/2022 1036   ALKPHOS 52 02/13/2022 1036   AST 19 02/13/2022 1036   ALT 11 02/13/2022 1036   BILITOT 0.3 02/13/2022 1036       RADIOGRAPHIC STUDIES: I have reviewed imaging study with the patient I have personally reviewed the radiological images as listed and agreed with the findings in the report. CT ABDOMEN PELVIS W CONTRAST  Result Date: 02/06/2022 CLINICAL DATA:  Cervical cancer, assess treatment response, status post chemotherapy and XRT, ongoing immunotherapy * Tracking Code: BO * EXAM: CT ABDOMEN AND PELVIS WITH CONTRAST TECHNIQUE: Multidetector CT imaging of the abdomen and pelvis was performed using the standard protocol following bolus administration of intravenous contrast. RADIATION DOSE REDUCTION: This exam was performed according to the departmental dose-optimization program which includes automated exposure control, adjustment of the mA and/or kV according to patient size and/or use of iterative reconstruction technique. CONTRAST:  108m OMNIPAQUE IOHEXOL 300 MG/ML SOLN, additional oral enteric contrast COMPARISON:  10/15/2021 FINDINGS: Lower chest: No acute abnormality. Bandlike scarring of the  bilateral lung bases. Hepatobiliary: No solid liver abnormality is seen. No gallstones, gallbladder wall thickening, or biliary dilatation. Pancreas: Unremarkable. No pancreatic ductal dilatation or surrounding inflammatory changes. Spleen: Normal in size without significant abnormality. Adrenals/Urinary Tract: Adrenal glands are unremarkable. Unchanged severe right hydronephrosis and hydroureter, the distal right ureter obstructed by mass in the pelvis, again with slight progression of right renal cortical atrophy. The left kidney is normal. No left-sided hydronephrosis. Bladder is unremarkable. Stomach/Bowel: Stomach is within normal limits. Appendix appears normal. No evidence of bowel wall thickening, distention, or inflammatory changes. Sigmoid diverticula. Vascular/Lymphatic: Aortic atherosclerosis. No enlarged abdominal or pelvic lymph nodes. Reproductive: Slight interval decrease in size of a heterogeneous mass within the right pelvis, involving the adjacent right aspect of the uterus and closely abutting the posterior urinary bladder, measuring 4.6 x 2.8 cm, previously 5.4 x 3.2 cm (series 2, image 60). Fat stranding and presacral soft tissue thickening in the pelvis, unchanged. Other: No abdominal wall hernia or abnormality. No ascites. Musculoskeletal: No acute or significant osseous findings. Unchanged, mildly sclerotic nondisplaced bilateral sacral insufficiency fractures (series 5, image 55). IMPRESSION: 1. Slight interval decrease in size of a heterogeneous mass within the right pelvis, involving the adjacent right aspect of the uterus and closely abutting the posterior urinary bladder,, consistent with treatment response. Fat stranding and presacral soft tissue thickening in the adjacent pelvis, unchanged. 2. Unchanged severe right hydronephrosis and hydroureter, the distal right ureter obstructed by mass in the pelvis, again with slight progression of right renal cortical atrophy. 3. Unchanged,  mildly sclerotic nondisplaced bilateral sacral insufficiency fractures. Aortic Atherosclerosis (ICD10-I70.0). Electronically Signed   By: ADelanna AhmadiM.D.   On: 02/06/2022 17:51

## 2022-02-13 NOTE — Assessment & Plan Note (Signed)
We will continue B12 injections monthly I plan to monitor her B12 closely, repeat level is slightly better 

## 2022-02-16 ENCOUNTER — Telehealth: Payer: Self-pay | Admitting: Hematology and Oncology

## 2022-02-16 NOTE — Telephone Encounter (Signed)
Please advised...  Scheduled appointment per patients availability.

## 2022-02-20 ENCOUNTER — Inpatient Hospital Stay: Payer: Medicare Other

## 2022-02-20 ENCOUNTER — Other Ambulatory Visit: Payer: Self-pay

## 2022-02-20 VITALS — BP 147/77 | HR 70 | Temp 98.5°F | Resp 18 | Wt 92.5 lb

## 2022-02-20 DIAGNOSIS — E538 Deficiency of other specified B group vitamins: Secondary | ICD-10-CM

## 2022-02-20 DIAGNOSIS — E039 Hypothyroidism, unspecified: Secondary | ICD-10-CM

## 2022-02-20 DIAGNOSIS — D61818 Other pancytopenia: Secondary | ICD-10-CM

## 2022-02-20 DIAGNOSIS — C539 Malignant neoplasm of cervix uteri, unspecified: Secondary | ICD-10-CM

## 2022-02-20 DIAGNOSIS — Z7189 Other specified counseling: Secondary | ICD-10-CM

## 2022-02-20 DIAGNOSIS — Z5111 Encounter for antineoplastic chemotherapy: Secondary | ICD-10-CM | POA: Diagnosis not present

## 2022-02-20 LAB — CBC WITH DIFFERENTIAL (CANCER CENTER ONLY)
Abs Immature Granulocytes: 0.07 10*3/uL (ref 0.00–0.07)
Basophils Absolute: 0.1 10*3/uL (ref 0.0–0.1)
Basophils Relative: 1 %
Eosinophils Absolute: 0.1 10*3/uL (ref 0.0–0.5)
Eosinophils Relative: 1 %
HCT: 30.8 % — ABNORMAL LOW (ref 36.0–46.0)
Hemoglobin: 10 g/dL — ABNORMAL LOW (ref 12.0–15.0)
Immature Granulocytes: 1 %
Lymphocytes Relative: 21 %
Lymphs Abs: 1 10*3/uL (ref 0.7–4.0)
MCH: 30.3 pg (ref 26.0–34.0)
MCHC: 32.5 g/dL (ref 30.0–36.0)
MCV: 93.3 fL (ref 80.0–100.0)
Monocytes Absolute: 0.5 10*3/uL (ref 0.1–1.0)
Monocytes Relative: 10 %
Neutro Abs: 3.2 10*3/uL (ref 1.7–7.7)
Neutrophils Relative %: 66 %
Platelet Count: 187 10*3/uL (ref 150–400)
RBC: 3.3 MIL/uL — ABNORMAL LOW (ref 3.87–5.11)
RDW: 14.5 % (ref 11.5–15.5)
WBC Count: 4.9 10*3/uL (ref 4.0–10.5)
nRBC: 0 % (ref 0.0–0.2)

## 2022-02-20 LAB — CMP (CANCER CENTER ONLY)
ALT: 9 U/L (ref 0–44)
AST: 16 U/L (ref 15–41)
Albumin: 4.1 g/dL (ref 3.5–5.0)
Alkaline Phosphatase: 44 U/L (ref 38–126)
Anion gap: 6 (ref 5–15)
BUN: 21 mg/dL (ref 8–23)
CO2: 28 mmol/L (ref 22–32)
Calcium: 9.4 mg/dL (ref 8.9–10.3)
Chloride: 106 mmol/L (ref 98–111)
Creatinine: 1.11 mg/dL — ABNORMAL HIGH (ref 0.44–1.00)
GFR, Estimated: 54 mL/min — ABNORMAL LOW (ref >60)
Glucose, Bld: 93 mg/dL (ref 70–99)
Potassium: 4.3 mmol/L (ref 3.5–5.1)
Sodium: 140 mmol/L (ref 135–145)
Total Bilirubin: 0.3 mg/dL (ref 0.3–1.2)
Total Protein: 6.7 g/dL (ref 6.5–8.1)

## 2022-02-20 LAB — TSH: TSH: 68.457 u[IU]/mL — ABNORMAL HIGH (ref 0.350–4.500)

## 2022-02-20 LAB — VITAMIN B12: Vitamin B-12: 307 pg/mL (ref 180–914)

## 2022-02-20 MED ORDER — SODIUM CHLORIDE 0.9 % IV SOLN
10.0000 mg | Freq: Once | INTRAVENOUS | Status: AC
  Administered 2022-02-20: 10 mg via INTRAVENOUS
  Filled 2022-02-20: qty 10

## 2022-02-20 MED ORDER — SODIUM CHLORIDE 0.9% FLUSH
10.0000 mL | Freq: Once | INTRAVENOUS | Status: AC
  Administered 2022-02-20: 10 mL

## 2022-02-20 MED ORDER — SODIUM CHLORIDE 0.9 % IV SOLN
80.0000 mg/m2 | Freq: Once | INTRAVENOUS | Status: AC
  Administered 2022-02-20: 108 mg via INTRAVENOUS
  Filled 2022-02-20: qty 18

## 2022-02-20 MED ORDER — HEPARIN SOD (PORK) LOCK FLUSH 100 UNIT/ML IV SOLN
500.0000 [IU] | Freq: Once | INTRAVENOUS | Status: AC | PRN
  Administered 2022-02-20: 500 [IU]

## 2022-02-20 MED ORDER — SODIUM CHLORIDE 0.9% FLUSH
10.0000 mL | INTRAVENOUS | Status: DC | PRN
  Administered 2022-02-20: 10 mL

## 2022-02-20 MED ORDER — DIPHENHYDRAMINE HCL 50 MG/ML IJ SOLN
50.0000 mg | Freq: Once | INTRAMUSCULAR | Status: AC
  Administered 2022-02-20: 50 mg via INTRAVENOUS
  Filled 2022-02-20: qty 1

## 2022-02-20 MED ORDER — SODIUM CHLORIDE 0.9 % IV SOLN: Freq: Once | INTRAVENOUS | Status: AC

## 2022-02-20 MED ORDER — FAMOTIDINE IN NACL 20-0.9 MG/50ML-% IV SOLN
20.0000 mg | Freq: Once | INTRAVENOUS | Status: AC
  Administered 2022-02-20: 20 mg via INTRAVENOUS
  Filled 2022-02-20: qty 50

## 2022-02-20 NOTE — Patient Instructions (Signed)
[facility] MEDICAL [facility]COLOG[facility]  Discharge Instructions: Thank you for choosing [facility] to provide your oncology and hematology care.   If you have a lab appointment with the Cancer Center, please go directly to the Cancer Center and check in at the registration area.   Wear comfortable clothing and clothing appropriate for easy access to any Portacath or PICC line.   We strive to give you quality time with your provider. You may need to reschedule your appointment if you arrive late (15 or more minutes).  Arriving late affects you and other patients whose appointments are after yours.  Also, if you miss three or more appointments without notifying the office, you may be dismissed from the clinic at the provider's discretion.      For prescription refill requests, have your pharmacy contact our office and allow 72 hours for refills to be completed.    Today you received the following chemotherapy and/or immunotherapy agent: Paclitaxel (Taxol)   To help prevent nausea and vomiting after your treatment, we encourage you to take your nausea medication as directed.  BELOW ARE SYMPTOMS THAT SHOULD BE REPORTED IMMEDIATELY: *FEVER GREATER THAN 100.4 F (38 C) OR HIGHER *CHILLS OR SWEATING *NAUSEA AND VOMITING THAT IS NOT CONTROLLED WITH YOUR NAUSEA MEDICATION *UNUSUAL SHORTNESS OF BREATH *UNUSUAL BRUISING OR BLEEDING *URINARY PROBLEMS (pain or burning when urinating, or frequent urination) *BOWEL PROBLEMS (unusual diarrhea, constipation, pain near the anus) TENDERNESS IN MOUTH AND THROAT WITH OR WITHOUT PRESENCE OF ULCERS (sore throat, sores in mouth, or a toothache) UNUSUAL RASH, SWELLING OR PAIN  UNUSUAL VAGINAL DISCHARGE OR ITCHING   Items with * indicate a potential emergency and should be followed up as soon as possible or go to the Emergency Department if any problems should occur.  Please show the CHEMOTHERAPY ALERT CARD or IMMUNOTHERAPY ALERT CARD at  check-in to the Emergency Department and triage nurse.  Should you have questions after your visit or need to cancel or reschedule your appointment, please contact [facility]  Dept: [phone]  and follow the prompts.  Office hours are 8:00 a.m. to 4:30 p.m. Monday - Friday. Please note that voicemails left after 4:00 p.m. may not be returned until the following business day.  We are closed weekends and major holidays. You have access to a nurse at all times for urgent questions. Please call the main number to the clinic Dept: [phone] and follow the prompts.   For any non-urgent questions, you may also contact your provider using MyChart. We now offer e-Visits for anyone 18 and older to request care online for non-urgent symptoms. For details visit [email or web address].   Also download the MyChart app! Go to the app store, search "MyChart", open the app, select [facility], and log in with your MyChart username and password.  Due to Covid, a mask is required upon entering the hospital/clinic. If you do not have a mask, one will be given to you upon arrival. For doctor visits, patients may have 1 support person aged 18 or older with them. For treatment visits, patients cannot have anyone with them due to current Covid guidelines and our immunocompromised population.  

## 2022-02-23 ENCOUNTER — Telehealth: Payer: Self-pay

## 2022-02-23 ENCOUNTER — Other Ambulatory Visit: Payer: Self-pay

## 2022-02-23 MED ORDER — LEVOTHYROXINE SODIUM 75 MCG PO TABS: 75.0000 ug | ORAL_TABLET | Freq: Every day | ORAL | 2 refills | Status: DC

## 2022-02-23 NOTE — Telephone Encounter (Signed)
Called and given below message to [place]. She verbalized understanding. Rx sent to her preferred pharmacy.

## 2022-02-23 NOTE — Telephone Encounter (Signed)
-----   Message from [name], MD sent at 02/23/2022  8:27 AM EDT ----- Her TSH is high again She needs to increase synthroid back to 75 mcg Not sure if she has them at home If not please call in to her pharm, 30 tabs with 2 refills

## 2022-03-06 ENCOUNTER — Inpatient Hospital Stay: Payer: Medicare Other

## 2022-03-06 ENCOUNTER — Other Ambulatory Visit: Payer: Self-pay

## 2022-03-06 VITALS — BP 140/78 | HR 75 | Temp 98.5°F | Resp 17 | Ht 62.0 in | Wt 88.5 lb

## 2022-03-06 DIAGNOSIS — Z7189 Other specified counseling: Secondary | ICD-10-CM

## 2022-03-06 DIAGNOSIS — Z5111 Encounter for antineoplastic chemotherapy: Secondary | ICD-10-CM | POA: Diagnosis not present

## 2022-03-06 DIAGNOSIS — E538 Deficiency of other specified B group vitamins: Secondary | ICD-10-CM

## 2022-03-06 DIAGNOSIS — C539 Malignant neoplasm of cervix uteri, unspecified: Secondary | ICD-10-CM

## 2022-03-06 DIAGNOSIS — D61818 Other pancytopenia: Secondary | ICD-10-CM

## 2022-03-06 LAB — CBC WITH DIFFERENTIAL (CANCER CENTER ONLY)
Abs Immature Granulocytes: 0.05 10*3/uL (ref 0.00–0.07)
Basophils Absolute: 0.1 10*3/uL (ref 0.0–0.1)
Basophils Relative: 1 %
Eosinophils Absolute: 0.1 10*3/uL (ref 0.0–0.5)
Eosinophils Relative: 1 %
HCT: 31.4 % — ABNORMAL LOW (ref 36.0–46.0)
Hemoglobin: 10.4 g/dL — ABNORMAL LOW (ref 12.0–15.0)
Immature Granulocytes: 1 %
Lymphocytes Relative: 16 %
Lymphs Abs: 0.9 10*3/uL (ref 0.7–4.0)
MCH: 31 pg (ref 26.0–34.0)
MCHC: 33.1 g/dL (ref 30.0–36.0)
MCV: 93.5 fL (ref 80.0–100.0)
Monocytes Absolute: 0.7 10*3/uL (ref 0.1–1.0)
Monocytes Relative: 14 %
Neutro Abs: 3.5 10*3/uL (ref 1.7–7.7)
Neutrophils Relative %: 67 %
Platelet Count: 209 10*3/uL (ref 150–400)
RBC: 3.36 MIL/uL — ABNORMAL LOW (ref 3.87–5.11)
RDW: 14.6 % (ref 11.5–15.5)
WBC Count: 5.3 10*3/uL (ref 4.0–10.5)
nRBC: 0 % (ref 0.0–0.2)

## 2022-03-06 LAB — CMP (CANCER CENTER ONLY)
ALT: 9 U/L (ref 0–44)
AST: 19 U/L (ref 15–41)
Albumin: 4.4 g/dL (ref 3.5–5.0)
Alkaline Phosphatase: 53 U/L (ref 38–126)
Anion gap: 7 (ref 5–15)
BUN: 23 mg/dL (ref 8–23)
CO2: 28 mmol/L (ref 22–32)
Calcium: 10 mg/dL (ref 8.9–10.3)
Chloride: 107 mmol/L (ref 98–111)
Creatinine: 1.21 mg/dL — ABNORMAL HIGH (ref 0.44–1.00)
GFR, Estimated: 49 mL/min — ABNORMAL LOW (ref >60)
Glucose, Bld: 94 mg/dL (ref 70–99)
Potassium: 4.2 mmol/L (ref 3.5–5.1)
Sodium: 142 mmol/L (ref 135–145)
Total Bilirubin: 0.3 mg/dL (ref 0.3–1.2)
Total Protein: 7.1 g/dL (ref 6.5–8.1)

## 2022-03-06 MED ORDER — DIPHENHYDRAMINE HCL 50 MG/ML IJ SOLN
50.0000 mg | Freq: Once | INTRAMUSCULAR | Status: AC
  Administered 2022-03-06: 50 mg via INTRAVENOUS
  Filled 2022-03-06: qty 1

## 2022-03-06 MED ORDER — SODIUM CHLORIDE 0.9% FLUSH
10.0000 mL | INTRAVENOUS | Status: DC | PRN
  Administered 2022-03-06: 10 mL

## 2022-03-06 MED ORDER — SODIUM CHLORIDE 0.9 % IV SOLN
80.0000 mg/m2 | Freq: Once | INTRAVENOUS | Status: AC
  Administered 2022-03-06: 108 mg via INTRAVENOUS
  Filled 2022-03-06: qty 18

## 2022-03-06 MED ORDER — SODIUM CHLORIDE 0.9% FLUSH
10.0000 mL | Freq: Once | INTRAVENOUS | Status: AC
  Administered 2022-03-06: 10 mL

## 2022-03-06 MED ORDER — HEPARIN SOD (PORK) LOCK FLUSH 100 UNIT/ML IV SOLN
500.0000 [IU] | Freq: Once | INTRAVENOUS | Status: AC | PRN
  Administered 2022-03-06: 500 [IU]

## 2022-03-06 MED ORDER — SODIUM CHLORIDE 0.9 % IV SOLN: Freq: Once | INTRAVENOUS | Status: AC

## 2022-03-06 MED ORDER — FAMOTIDINE IN NACL 20-0.9 MG/50ML-% IV SOLN
20.0000 mg | Freq: Once | INTRAVENOUS | Status: AC
  Administered 2022-03-06: 20 mg via INTRAVENOUS
  Filled 2022-03-06: qty 50

## 2022-03-06 MED ORDER — SODIUM CHLORIDE 0.9 % IV SOLN
10.0000 mg | Freq: Once | INTRAVENOUS | Status: AC
  Administered 2022-03-06: 10 mg via INTRAVENOUS
  Filled 2022-03-06: qty 10

## 2022-03-12 MED FILL — Dexamethasone Sodium Phosphate Inj 100 MG/10ML: INTRAMUSCULAR | Qty: 1 | Status: AC

## 2022-03-13 ENCOUNTER — Inpatient Hospital Stay: Payer: Medicare Other

## 2022-03-13 ENCOUNTER — Inpatient Hospital Stay (HOSPITAL_BASED_OUTPATIENT_CLINIC_OR_DEPARTMENT_OTHER): Payer: Medicare Other | Admitting: Hematology and Oncology

## 2022-03-13 ENCOUNTER — Encounter: Payer: Self-pay | Admitting: Hematology and Oncology

## 2022-03-13 ENCOUNTER — Other Ambulatory Visit: Payer: Self-pay

## 2022-03-13 DIAGNOSIS — T451X5A Adverse effect of antineoplastic and immunosuppressive drugs, initial encounter: Secondary | ICD-10-CM | POA: Diagnosis not present

## 2022-03-13 DIAGNOSIS — C539 Malignant neoplasm of cervix uteri, unspecified: Secondary | ICD-10-CM

## 2022-03-13 DIAGNOSIS — Z7189 Other specified counseling: Secondary | ICD-10-CM

## 2022-03-13 DIAGNOSIS — D6481 Anemia due to antineoplastic chemotherapy: Secondary | ICD-10-CM | POA: Diagnosis not present

## 2022-03-13 DIAGNOSIS — D61818 Other pancytopenia: Secondary | ICD-10-CM

## 2022-03-13 DIAGNOSIS — N1330 Unspecified hydronephrosis: Secondary | ICD-10-CM

## 2022-03-13 DIAGNOSIS — Z5111 Encounter for antineoplastic chemotherapy: Secondary | ICD-10-CM | POA: Diagnosis not present

## 2022-03-13 DIAGNOSIS — E538 Deficiency of other specified B group vitamins: Secondary | ICD-10-CM

## 2022-03-13 LAB — CMP (CANCER CENTER ONLY)
ALT: 9 U/L (ref 0–44)
AST: 16 U/L (ref 15–41)
Albumin: 4.2 g/dL (ref 3.5–5.0)
Alkaline Phosphatase: 47 U/L (ref 38–126)
Anion gap: 6 (ref 5–15)
BUN: 21 mg/dL (ref 8–23)
CO2: 28 mmol/L (ref 22–32)
Calcium: 9.6 mg/dL (ref 8.9–10.3)
Chloride: 107 mmol/L (ref 98–111)
Creatinine: 1.14 mg/dL — ABNORMAL HIGH (ref 0.44–1.00)
GFR, Estimated: 52 mL/min — ABNORMAL LOW (ref >60)
Glucose, Bld: 87 mg/dL (ref 70–99)
Potassium: 4 mmol/L (ref 3.5–5.1)
Sodium: 141 mmol/L (ref 135–145)
Total Bilirubin: 0.3 mg/dL (ref 0.3–1.2)
Total Protein: 7.1 g/dL (ref 6.5–8.1)

## 2022-03-13 LAB — CBC WITH DIFFERENTIAL (CANCER CENTER ONLY)
Abs Immature Granulocytes: 0.07 10*3/uL (ref 0.00–0.07)
Basophils Absolute: 0 10*3/uL (ref 0.0–0.1)
Basophils Relative: 1 %
Eosinophils Absolute: 0.1 10*3/uL (ref 0.0–0.5)
Eosinophils Relative: 2 %
HCT: 31.3 % — ABNORMAL LOW (ref 36.0–46.0)
Hemoglobin: 10.1 g/dL — ABNORMAL LOW (ref 12.0–15.0)
Immature Granulocytes: 1 %
Lymphocytes Relative: 18 %
Lymphs Abs: 0.9 10*3/uL (ref 0.7–4.0)
MCH: 30.2 pg (ref 26.0–34.0)
MCHC: 32.3 g/dL (ref 30.0–36.0)
MCV: 93.7 fL (ref 80.0–100.0)
Monocytes Absolute: 0.5 10*3/uL (ref 0.1–1.0)
Monocytes Relative: 11 %
Neutro Abs: 3.4 10*3/uL (ref 1.7–7.7)
Neutrophils Relative %: 67 %
Platelet Count: 201 10*3/uL (ref 150–400)
RBC: 3.34 MIL/uL — ABNORMAL LOW (ref 3.87–5.11)
RDW: 14.5 % (ref 11.5–15.5)
WBC Count: 5 10*3/uL (ref 4.0–10.5)
nRBC: 0 % (ref 0.0–0.2)

## 2022-03-13 MED ORDER — SODIUM CHLORIDE 0.9 % IV SOLN
10.0000 mg | Freq: Once | INTRAVENOUS | Status: AC
  Administered 2022-03-13: 10 mg via INTRAVENOUS
  Filled 2022-03-13: qty 10

## 2022-03-13 MED ORDER — SODIUM CHLORIDE 0.9% FLUSH
10.0000 mL | INTRAVENOUS | Status: DC | PRN
  Administered 2022-03-13: 10 mL

## 2022-03-13 MED ORDER — SODIUM CHLORIDE 0.9% FLUSH
10.0000 mL | Freq: Once | INTRAVENOUS | Status: AC
  Administered 2022-03-13: 10 mL

## 2022-03-13 MED ORDER — SODIUM CHLORIDE 0.9 % IV SOLN: Freq: Once | INTRAVENOUS | Status: AC

## 2022-03-13 MED ORDER — CYANOCOBALAMIN 1000 MCG/ML IJ SOLN
1000.0000 ug | Freq: Once | INTRAMUSCULAR | Status: AC
  Administered 2022-03-13: 1000 ug via INTRAMUSCULAR
  Filled 2022-03-13: qty 1

## 2022-03-13 MED ORDER — DIPHENHYDRAMINE HCL 50 MG/ML IJ SOLN
50.0000 mg | Freq: Once | INTRAMUSCULAR | Status: AC
  Administered 2022-03-13: 50 mg via INTRAVENOUS
  Filled 2022-03-13: qty 1

## 2022-03-13 MED ORDER — HEPARIN SOD (PORK) LOCK FLUSH 100 UNIT/ML IV SOLN
500.0000 [IU] | Freq: Once | INTRAVENOUS | Status: AC | PRN
  Administered 2022-03-13: 500 [IU]

## 2022-03-13 MED ORDER — FAMOTIDINE IN NACL 20-0.9 MG/50ML-% IV SOLN
20.0000 mg | Freq: Once | INTRAVENOUS | Status: AC
  Administered 2022-03-13: 20 mg via INTRAVENOUS
  Filled 2022-03-13: qty 50

## 2022-03-13 MED ORDER — SODIUM CHLORIDE 0.9 % IV SOLN
80.0000 mg/m2 | Freq: Once | INTRAVENOUS | Status: AC
  Administered 2022-03-13: 108 mg via INTRAVENOUS
  Filled 2022-03-13: qty 18

## 2022-03-13 NOTE — Assessment & Plan Note (Signed)

## 2022-03-13 NOTE — Patient Instructions (Signed)
[facility]  Discharge Instructions: Thank you for choosing [facility] to provide your oncology and hematology care.   If you have a lab appointment with the [facility], please go directly to the [facility] and check in at the registration area.   Wear comfortable clothing and clothing appropriate for easy access to any Portacath or PICC line.   We strive to give you quality time with your provider. You may need to reschedule your appointment if you arrive late (15 or more minutes).  Arriving late affects you and other patients whose appointments are after yours.  Also, if you miss three or more appointments without notifying the office, you may be dismissed from the clinic at the provider's discretion.      For prescription refill requests, have your pharmacy contact our office and allow 72 hours for refills to be completed.    Today you received the following chemotherapy and/or immunotherapy agent: Paclitaxel (Taxol)   To help prevent nausea and vomiting after your treatment, we encourage you to take your nausea medication as directed.  BELOW ARE SYMPTOMS THAT SHOULD BE REPORTED IMMEDIATELY: *FEVER GREATER THAN 100.4 F (38 C) OR HIGHER *CHILLS OR SWEATING *NAUSEA AND VOMITING THAT IS NOT CONTROLLED WITH YOUR NAUSEA MEDICATION *UNUSUAL SHORTNESS OF BREATH *UNUSUAL BRUISING OR BLEEDING *URINARY PROBLEMS (pain or burning when urinating, or frequent urination) *BOWEL PROBLEMS (unusual diarrhea, constipation, pain near the anus) TENDERNESS IN MOUTH AND THROAT WITH OR WITHOUT PRESENCE OF ULCERS (sore throat, sores in mouth, or a toothache) UNUSUAL RASH, SWELLING OR PAIN  UNUSUAL VAGINAL DISCHARGE OR ITCHING   Items with * indicate a potential emergency and should be followed up as soon as possible or go to the Emergency Department if any problems should occur.  Please show the CHEMOTHERAPY ALERT CARD or IMMUNOTHERAPY ALERT CARD at  check-in to the Emergency Department and triage nurse.  Should you have questions after your visit or need to cancel or reschedule your appointment, please contact [facility]  Dept: [phone]  and follow the prompts.  Office hours are 8:00 a.m. to 4:30 p.m. Monday - Friday. Please note that voicemails left after 4:00 p.m. may not be returned until the following business day.  We are closed weekends and major holidays. You have access to a nurse at all times for urgent questions. Please call the main number to the clinic Dept: [phone] and follow the prompts.   For any non-urgent questions, you may also contact your provider using MyChart. We now offer e-Visits for anyone 21 and older to request care online for non-urgent symptoms. For details visit [email or web address].   Also download the MyChart app! Go to the app store, search "MyChart", open the app, select [facility], and log in with your MyChart username and password.  Due to Covid, a mask is required upon entering the hospital/clinic. If you do not have a mask, one will be given to you upon arrival. For doctor visits, patients may have 1 support person aged 28 or older with them. For treatment visits, patients cannot have anyone with them due to current Covid guidelines and our immunocompromised population.

## 2022-03-13 NOTE — Progress Notes (Signed)
[facility] OFFICE PROGRESS NOTE  Patient Care Team: Default, Provider, MD as PCP - General  ASSESSMENT & PLAN:  Malignant neoplasm of cervix ([facility]) So far, she tolerated single agent Taxol without difficulties Her blood count looks stable We will continue modified treatment as scheduled CT imaging show positive response to treatment She will continue her treatment as scheduled I plan to repeat imaging study again end of September  Hydronephrosis of right kidney She has compromised renal function due to hydronephrosis but overall asymptomatic I do not recommend stent placement long-term  Anemia due to antineoplastic chemotherapy This is likely due to recent treatment. The patient denies recent history of bleeding such as epistaxis, hematuria or hematochezia. She is asymptomatic from the anemia. I will observe for now.  She does not require transfusion now. I will continue the chemotherapy at current dose without dosage adjustment.  If the anemia gets progressive worse in the future, I might have to delay her treatment or adjust the chemotherapy dose.   No orders of the defined types were placed in this encounter.   All questions were answered. The patient knows to call the clinic with any problems, questions or concerns. The total time spent in the appointment was 20 minutes encounter with patients including review of chart and various tests results, discussions about plan of care and coordination of care plan   [name], MD 03/13/2022 1:02 PM  INTERVAL HISTORY: Please see below for problem oriented charting. she returns for treatment follow-up on single agent Taxol She denies worsening neuropathy No recent pelvic pain, discharge or bleeding  REVIEW OF SYSTEMS:   Constitutional: Denies fevers, chills or abnormal weight loss Eyes: Denies blurriness of vision Ears, nose, mouth, throat, and face: Denies mucositis or sore throat Respiratory: Denies cough, dyspnea  or wheezes Cardiovascular: Denies palpitation, chest discomfort or lower extremity swelling Gastrointestinal:  Denies nausea, heartburn or change in bowel habits Skin: Denies abnormal skin rashes Lymphatics: Denies new lymphadenopathy or easy bruising Neurological:Denies numbness, tingling or new weaknesses Behavioral/Psych: Mood is stable, no new changes  All other systems were reviewed with the patient and are negative.  I have reviewed the past medical history, past surgical history, social history and family history with the patient and they are unchanged from previous note.  ALLERGIES:  has No Known Allergies.  MEDICATIONS:  Current Outpatient Medications  Medication Sig Dispense Refill   levothyroxine (SYNTHROID) 75 MCG tablet Take 1 tablet (75 mcg total) by mouth daily before breakfast. 30 tablet 2   lidocaine-prilocaine (EMLA) cream Apply to affected area once 30 g 3   Multiple Vitamin (MULTIVITAMIN) tablet Take 1 tablet by mouth daily.     ondansetron (ZOFRAN) 8 MG tablet Take 1 tablet (8 mg total) by mouth 2 (two) times daily as needed (Nausea or vomiting). 30 tablet 1   prochlorperazine (COMPAZINE) 10 MG tablet Take 1 tablet (10 mg total) by mouth every 6 (six) hours as needed (Nausea or vomiting). 30 tablet 1   No current facility-administered medications for this visit.   Facility-Administered Medications Ordered in Other Visits  Medication Dose Route Frequency Provider Last Rate Last Admin   cyanocobalamin ((VITAMIN B-12)) injection 1,000 mcg  1,000 mcg Intramuscular Once Alvy Bimler, Cosandra Plouffe, MD       famotidine (PEPCID) IVPB 20 mg premix  20 mg Intravenous Once Alvy Bimler, York Valliant, MD       heparin lock flush 100 unit/mL  500 Units Intracatheter Once PRN [name], MD  PACLitaxel (TAXOL) 108 mg in sodium chloride 0.9 % 250 mL chemo infusion (</= $RemoveBefor'80mg'ZENJrdTUGOnY$ /m2)  80 mg/m2 (Treatment Plan Recorded) Intravenous Once Loana Salvaggio, MD       sodium chloride flush (NS) 0.9 % injection 10 mL  10  mL Intracatheter PRN [name], MD        SUMMARY OF ONCOLOGIC HISTORY: Oncology History Overview Note  Cancer Staging Malignant neoplasm of cervix ([facility]) Staging form: Cervix Uteri, AJCC 8th Edition - Clinical: Stage IIIB (cT3b, cN1, cM0) - Signed by [name], MD on 05/04/2018  PD-L1 1% Progressed on carboplatin, pembrolizumab and cisplatin   Malignant neoplasm of cervix ([facility])  03/13/2018 Imaging   US pelvis There are mobile echogenic foci within the endometrial cavity suggestive of gas. This is nonspecific in etiology however may be secondary to endometritis. Correlate for history of recent instrumentation. This obscures visualization of the endometrial tissue and therefore a follow-up pelvic ultrasound in 2-4 weeks is recommended after resolution of the acute symptomatology if symptoms persist to further evaluate the endometrium.     03/13/2018 Initial Diagnosis   She initially presented with PMB   04/12/2018 Pathology Results   Cervix, biopsy, mass - INVASIVE SQUAMOUS CELL CARCINOMA - SEE COMMENT   04/12/2018 Surgery   PREOPERATIVE DIAGNOSIS:  Postmenopausal vaginal bleeding. POSTOPERATIVE DIAGNOSIS: The same PROCEDURE: Exam under anesthesia, pap smear, cervical mass biopsy SURGEON:  Dr. [name]   INDICATIONS: 71 y.o. yo G0P0000 with PMB here for exam under anesthesia.Risks of surgery were discussed with the patient including but not limited to: bleeding which may require transfusion; infection which may require antibiotics; injury to uterus or surrounding organs; need for additional procedures including laparotomy or laparoscopy; and other postoperative/anesthesia complications. Written informed consent was obtained.     FINDINGS:  An 8-week size midline uterus.  No adnexal mass palpable on exam. Normal cervix not visualized. Friable mass seen in involving the vagina at the level of the cervix obliterating the anterior and posterior fornix.   ANESTHESIA:    General INTRAVENOUS FLUIDS:  300 ml of LR ESTIMATED BLOOD LOSS: 20 ml. SPECIMENS: pap smear, cervical mass biopsy COMPLICATIONS:  None immediate.     04/25/2018 PET scan   1. Large cervical mass may invade into the myometrium in down into the vagina, maximum SUV 21.3. There is a malignant left external iliac node measuring 1.4 cm in short axis with maximum SUV 14.1. 2. Accentuated activity in the cecum and ascending colon is likely physiologic given that it has no CT correlate. Correlation with the patient's colon cancer screening history is recommended. If screening is not up-to-date, appropriate screening should be considered. 3.  Aortic Atherosclerosis (ICD10-I70.0).     04/28/2018 Surgery   Pre-operative Diagnosis:  At least Stage 2B SCCa Cervical Cancer   Post-operative Diagnosis: At least clinical Stage 3A SCCa Cervical Cancer   Operation:  Exam under anesthesia due to intolerance for vaginal exam in office Cystoscopy due to concern for extension to posterior bladder (from anterior vaginal wall lesion)   Operative Findings:   Parametrial involvement on right  Right sided subvaginal tumor burden ~3cm. This approximates the right ureteral insertion into the bladder based on my exam during cystoscopy. Extension of disease down upper half of vagina on lateral and posterior walls. Extension of disease down to lower third of anterior vagina (versus skip lesion) thus making her Stage 3A Cystoscopy revealed patent bilateral ureteral orifices and no bladder mucosa invasion or areas of concern. Rectal exam grossly no disease.  05/04/2018 Cancer Staging   Staging form: Cervix Uteri, AJCC 8th Edition - Clinical: Stage IIIB (cT3b, cN1, cM0) - Signed by [name], MD on 05/04/2018   05/12/2018 Procedure   Successful 8 French right internal jugular vein power port placement with its tip at the SVC/RA junction   05/13/2018 - 06/24/2018 Chemotherapy   The patient had weekly cisplatin given  with concurrent radiation    05/17/2018 - 07/27/2018 Radiation Therapy   Radiation treatment dates:   05/17/2018-07/27/2018   Site/dose: 1. Cervix, 1.8 Gy in 25 fractions for a total dose of 45 Gy                    2. Pelvis  Boost, 1.8 Gy in 5 fractions for a total dose of 9 Gy                    3. Cervix, 5.5 Gy in 5 fractions for a total dose of 27.5 Gy   06/03/2018 Adverse Reaction   She missed her chemo due to severe depression and was sent to the ER for suicidal ideation.  She was seen by psychiatrist.   10/27/2018 PET scan   IMPRESSION: 1. Complete metabolic response to therapy, without residual or recurrent hypermetabolic disease. 2.  Aortic Atherosclerosis (ICD10-I70.0).     11/17/2018 Imaging   Successful right IJ vein Port-A-Cath explant.   03/29/2020 PET scan   1. Hypermetabolic lesion along the right vaginal cuff is highly worrisome for recurrent cervical cancer. No evidence of distant metastatic disease. 2.  Aortic atherosclerosis (ICD10-I70.0).   04/26/2020 - 12/10/2020 Chemotherapy   The patient had CARBOplatin  for chemotherapy treatment.      Genetic Testing   Patient has genetic testing done for PD-L1. Results revealed patient has the following:  PD-L1 combined positive score (CPS): 1%   07/08/2020 Imaging   1. Unchanged post treatment appearance of the pelvis, including a soft tissue nodule in the low right hemipelvis adjacent to the vagina measuring 2.8 x 1.7 cm, previously FDG avid and consistent with malignancy. 2. No evidence of nodal or distant metastatic disease in the abdomen or pelvis. 3. Aortic Atherosclerosis (ICD10-I70.0).   10/14/2020 Imaging   1. Unchanged right eccentric soft tissue mass in the low right hemipelvis centered on the cervix and vagina, measuring 2.9 x 1.9 cm. 2. New small volume fluid within the endometrial cavity, abnormal in the postmenopausal setting and suggesting obstruction of the cervical os by soft tissue mass. 3. No evidence  of lymphadenopathy or distant metastatic disease in the abdomen or pelvis.   Aortic Atherosclerosis (ICD10-I70.0).   12/24/2020 Imaging   1. Abnormal hypodense masslike appearance continuous with hypodense masslike lesion probably centrally within a right eccentric cervix (versus right paracervical mass). The endometrial portion has enlarged compared to 10/14/2020 and active malignancy is suspected. 2. No adenopathy or findings of distant spread. 3. Other imaging findings of potential clinical significance: Aortic Atherosclerosis (ICD10-I70.0). Mild cardiomegaly. Stable scarring in the left lower lobe. Sigmoid colon scattered diverticula. Tarlov cysts at the S2 level. Vertical sclerosis in the sacral ala possibly due to stress fractures or prior radiation therapy.     01/06/2021 - 06/19/2021 Chemotherapy   Patient is on Treatment Plan : UTERINE Pembrolizumab q21d      04/07/2021 Imaging   1. The previously hypermetabolic right vaginal mass currently measures 12 cubic cm in volume, previously 11 cubic cm on 12/23/2020. 2. No new adenopathy or new findings of distant metastatic spread.  3. New wall thickening in several loops of distal ileum suspicious for inflammation/enteritis. 4. Other imaging findings of potential clinical significance: Mild cardiomegaly. Mild sigmoid colon diverticulosis. Stable bony demineralization with vertical sclerosis in the sacral ala likely from old insufficiency fracture.   07/10/2021 Imaging   1. New moderate right-sided hydroureteronephrosis with decreased perfusion to the right kidney compatible with obstructive uropathy.  2. Interval progression of the heterogeneous low-density uterine lesion, with progression of enhancing soft tissue extending inferiorly to the right, compatible with previously described disease in the right vaginal wall. Represents the site of right ureteral obstruction. 3. No evidence for new metastatic disease in the abdomen or pelvis. 4.  Bilateral sacral insufficiency fractures. 5. Trace free fluid in the pelvis. 6. Aortic Atherosclerosis (ICD10-I70.0).     07/22/2021 Procedure   Successful placement of a right internal jugular approach power injectable Port-A-Cath.     07/24/2021 - 10/02/2021 Chemotherapy   Patient is on Treatment Plan : cervical cancer Cisplatin q7d       10/16/2021 Imaging   1. Mild interval increase in size of heterogeneous tumor arising from within the uterus with local tumor extension into the right pelvic sidewall. 2. Persistent right-sided obstructive uropathy with severe right hydronephrosis and progressive right renal cortical volume loss. Right hydroureter extends into the pelvis where it is obstructed by the uterine/cervical mass. 3. Bilateral sacral insufficiency fractures. 4. Aortic Atherosclerosis (ICD10-I70.0).   11/06/2021 -  Chemotherapy   Patient is on Treatment Plan : Cervical Paclitaxel q7d     02/10/2022 Imaging   1. Slight interval decrease in size of a heterogeneous mass within the right pelvis, involving the adjacent right aspect of the uterus and closely abutting the posterior urinary bladder,, consistent with treatment response. Fat stranding and presacral soft tissue thickening in the adjacent pelvis, unchanged. 2. Unchanged severe right hydronephrosis and hydroureter, the distal right ureter obstructed by mass in the pelvis, again with slight progression of right renal cortical atrophy. 3. Unchanged, mildly sclerotic nondisplaced bilateral sacral insufficiency fractures.   Aortic Atherosclerosis (ICD10-I70.0).     PHYSICAL EXAMINATION: ECOG PERFORMANCE STATUS: 1 - Symptomatic but completely ambulatory  Vitals:   03/13/22 1143  BP: (!) 163/92  Pulse: 94  Resp: 18  Temp: 98.2 F (36.8 C)  SpO2: 97%   Filed Weights   03/13/22 1143  Weight: 92 lb 3.2 oz (41.8 kg)    GENERAL:alert, no distress and comfortable NEURO: alert & oriented x 3 with fluent speech, no focal  motor/sensory deficits  LABORATORY DATA:  I have reviewed the data as listed    Component Value Date/Time   NA 141 03/13/2022 1128   K 4.0 03/13/2022 1128   CL 107 03/13/2022 1128   CO2 28 03/13/2022 1128   GLUCOSE 87 03/13/2022 1128   BUN 21 03/13/2022 1128   CREATININE 1.14 (H) 03/13/2022 1128   CALCIUM 9.6 03/13/2022 1128   PROT 7.1 03/13/2022 1128   ALBUMIN 4.2 03/13/2022 1128   AST 16 03/13/2022 1128   ALT 9 03/13/2022 1128   ALKPHOS 47 03/13/2022 1128   BILITOT 0.3 03/13/2022 1128   GFRNONAA 52 (L) 03/13/2022 1128   GFRAA >60 06/14/2020 0844   GFRAA >60 03/11/2020 0951    No results found for: "SPEP", "UPEP"  Lab Results  Component Value Date   WBC 5.0 03/13/2022   NEUTROABS 3.4 03/13/2022   HGB 10.1 (L) 03/13/2022   HCT 31.3 (L) 03/13/2022   MCV 93.7 03/13/2022   PLT 201 03/13/2022  Chemistry      Component Value Date/Time   NA 141 03/13/2022 1128   K 4.0 03/13/2022 1128   CL 107 03/13/2022 1128   CO2 28 03/13/2022 1128   BUN 21 03/13/2022 1128   CREATININE 1.14 (H) 03/13/2022 1128      Component Value Date/Time   CALCIUM 9.6 03/13/2022 1128   ALKPHOS 47 03/13/2022 1128   AST 16 03/13/2022 1128   ALT 9 03/13/2022 1128   BILITOT 0.3 03/13/2022 1128

## 2022-03-13 NOTE — Assessment & Plan Note (Signed)
So far, she tolerated single agent Taxol without difficulties Her blood count looks stable We will continue modified treatment as scheduled CT imaging show positive response to treatment She will continue her treatment as scheduled I plan to repeat imaging study again end of September

## 2022-03-13 NOTE — Assessment & Plan Note (Signed)
She has compromised renal function due to hydronephrosis but overall asymptomatic I do not recommend stent placement long-term 

## 2022-03-14 ENCOUNTER — Encounter: Payer: Self-pay | Admitting: Hematology and Oncology

## 2022-03-26 MED FILL — Dexamethasone Sodium Phosphate Inj 100 MG/10ML: INTRAMUSCULAR | Qty: 1 | Status: AC

## 2022-03-27 ENCOUNTER — Inpatient Hospital Stay: Payer: Medicare Other | Attending: Hematology and Oncology

## 2022-03-27 ENCOUNTER — Other Ambulatory Visit: Payer: Self-pay

## 2022-03-27 ENCOUNTER — Inpatient Hospital Stay: Payer: Medicare Other

## 2022-03-27 VITALS — BP 145/69 | HR 75 | Temp 98.2°F | Resp 18 | Ht 62.0 in | Wt 89.8 lb

## 2022-03-27 DIAGNOSIS — N1330 Unspecified hydronephrosis: Secondary | ICD-10-CM | POA: Diagnosis not present

## 2022-03-27 DIAGNOSIS — E039 Hypothyroidism, unspecified: Secondary | ICD-10-CM

## 2022-03-27 DIAGNOSIS — C539 Malignant neoplasm of cervix uteri, unspecified: Secondary | ICD-10-CM | POA: Insufficient documentation

## 2022-03-27 DIAGNOSIS — Z5111 Encounter for antineoplastic chemotherapy: Secondary | ICD-10-CM | POA: Diagnosis present

## 2022-03-27 DIAGNOSIS — E538 Deficiency of other specified B group vitamins: Secondary | ICD-10-CM | POA: Insufficient documentation

## 2022-03-27 DIAGNOSIS — Z79899 Other long term (current) drug therapy: Secondary | ICD-10-CM | POA: Insufficient documentation

## 2022-03-27 DIAGNOSIS — Z7189 Other specified counseling: Secondary | ICD-10-CM

## 2022-03-27 DIAGNOSIS — D61818 Other pancytopenia: Secondary | ICD-10-CM

## 2022-03-27 LAB — CMP (CANCER CENTER ONLY)
ALT: 7 U/L (ref 0–44)
AST: 16 U/L (ref 15–41)
Albumin: 4.2 g/dL (ref 3.5–5.0)
Alkaline Phosphatase: 54 U/L (ref 38–126)
Anion gap: 7 (ref 5–15)
BUN: 22 mg/dL (ref 8–23)
CO2: 27 mmol/L (ref 22–32)
Calcium: 9.4 mg/dL (ref 8.9–10.3)
Chloride: 106 mmol/L (ref 98–111)
Creatinine: 1.18 mg/dL — ABNORMAL HIGH (ref 0.44–1.00)
GFR, Estimated: 50 mL/min — ABNORMAL LOW (ref >60)
Glucose, Bld: 93 mg/dL (ref 70–99)
Potassium: 4 mmol/L (ref 3.5–5.1)
Sodium: 140 mmol/L (ref 135–145)
Total Bilirubin: 0.3 mg/dL (ref 0.3–1.2)
Total Protein: 7 g/dL (ref 6.5–8.1)

## 2022-03-27 LAB — CBC WITH DIFFERENTIAL (CANCER CENTER ONLY)
Abs Immature Granulocytes: 0.09 10*3/uL — ABNORMAL HIGH (ref 0.00–0.07)
Basophils Absolute: 0.1 10*3/uL (ref 0.0–0.1)
Basophils Relative: 1 %
Eosinophils Absolute: 0.1 10*3/uL (ref 0.0–0.5)
Eosinophils Relative: 2 %
HCT: 30.4 % — ABNORMAL LOW (ref 36.0–46.0)
Hemoglobin: 10.1 g/dL — ABNORMAL LOW (ref 12.0–15.0)
Immature Granulocytes: 2 %
Lymphocytes Relative: 14 %
Lymphs Abs: 0.8 10*3/uL (ref 0.7–4.0)
MCH: 30.7 pg (ref 26.0–34.0)
MCHC: 33.2 g/dL (ref 30.0–36.0)
MCV: 92.4 fL (ref 80.0–100.0)
Monocytes Absolute: 0.8 10*3/uL (ref 0.1–1.0)
Monocytes Relative: 15 %
Neutro Abs: 3.7 10*3/uL (ref 1.7–7.7)
Neutrophils Relative %: 66 %
Platelet Count: 223 10*3/uL (ref 150–400)
RBC: 3.29 MIL/uL — ABNORMAL LOW (ref 3.87–5.11)
RDW: 14.6 % (ref 11.5–15.5)
WBC Count: 5.5 10*3/uL (ref 4.0–10.5)
nRBC: 0 % (ref 0.0–0.2)

## 2022-03-27 LAB — TSH: TSH: 4.198 u[IU]/mL (ref 0.350–4.500)

## 2022-03-27 MED ORDER — SODIUM CHLORIDE 0.9 % IV SOLN
80.0000 mg/m2 | Freq: Once | INTRAVENOUS | Status: AC
  Administered 2022-03-27: 108 mg via INTRAVENOUS
  Filled 2022-03-27: qty 18

## 2022-03-27 MED ORDER — SODIUM CHLORIDE 0.9 % IV SOLN: Freq: Once | INTRAVENOUS | Status: AC

## 2022-03-27 MED ORDER — FAMOTIDINE IN NACL 20-0.9 MG/50ML-% IV SOLN
20.0000 mg | Freq: Once | INTRAVENOUS | Status: AC
  Administered 2022-03-27: 20 mg via INTRAVENOUS
  Filled 2022-03-27: qty 50

## 2022-03-27 MED ORDER — SODIUM CHLORIDE 0.9 % IV SOLN
10.0000 mg | Freq: Once | INTRAVENOUS | Status: AC
  Administered 2022-03-27: 10 mg via INTRAVENOUS
  Filled 2022-03-27: qty 10

## 2022-03-27 MED ORDER — DIPHENHYDRAMINE HCL 50 MG/ML IJ SOLN
50.0000 mg | Freq: Once | INTRAMUSCULAR | Status: AC
  Administered 2022-03-27: 50 mg via INTRAVENOUS
  Filled 2022-03-27: qty 1

## 2022-03-27 MED ORDER — SODIUM CHLORIDE 0.9% FLUSH
10.0000 mL | Freq: Once | INTRAVENOUS | Status: AC
  Administered 2022-03-27: 10 mL

## 2022-03-27 NOTE — Patient Instructions (Signed)
[facility]  Discharge Instructions: Thank you for choosing [facility] to provide your oncology and hematology care.   If you have a lab appointment with the [facility], please go directly to the [facility] and check in at the registration area.   Wear comfortable clothing and clothing appropriate for easy access to any Portacath or PICC line.   We strive to give you quality time with your provider. You may need to reschedule your appointment if you arrive late (15 or more minutes).  Arriving late affects you and other patients whose appointments are after yours.  Also, if you miss three or more appointments without notifying the office, you may be dismissed from the clinic at the provider's discretion.      For prescription refill requests, have your pharmacy contact our office and allow 72 hours for refills to be completed.    Today you received the following chemotherapy and/or immunotherapy agents paclitaxel      To help prevent nausea and vomiting after your treatment, we encourage you to take your nausea medication as directed.  BELOW ARE SYMPTOMS THAT SHOULD BE REPORTED IMMEDIATELY: *FEVER GREATER THAN 100.4 F (38 C) OR HIGHER *CHILLS OR SWEATING *NAUSEA AND VOMITING THAT IS NOT CONTROLLED WITH YOUR NAUSEA MEDICATION *UNUSUAL SHORTNESS OF BREATH *UNUSUAL BRUISING OR BLEEDING *URINARY PROBLEMS (pain or burning when urinating, or frequent urination) *BOWEL PROBLEMS (unusual diarrhea, constipation, pain near the anus) TENDERNESS IN MOUTH AND THROAT WITH OR WITHOUT PRESENCE OF ULCERS (sore throat, sores in mouth, or a toothache) UNUSUAL RASH, SWELLING OR PAIN  UNUSUAL VAGINAL DISCHARGE OR ITCHING   Items with * indicate a potential emergency and should be followed up as soon as possible or go to the Emergency Department if any problems should occur.  Please show the CHEMOTHERAPY ALERT CARD or IMMUNOTHERAPY ALERT CARD at check-in to  the Emergency Department and triage nurse.  Should you have questions after your visit or need to cancel or reschedule your appointment, please contact [facility]  Dept: [phone]  and follow the prompts.  Office hours are 8:00 a.m. to 4:30 p.m. Monday - Friday. Please note that voicemails left after 4:00 p.m. may not be returned until the following business day.  We are closed weekends and major holidays. You have access to a nurse at all times for urgent questions. Please call the main number to the clinic Dept: [phone] and follow the prompts.   For any non-urgent questions, you may also contact your provider using MyChart. We now offer e-Visits for anyone 2 and older to request care online for non-urgent symptoms. For details visit [email or web address].   Also download the MyChart app! Go to the app store, search "MyChart", open the app, select [facility], and log in with your MyChart username and password.  Masks are optional in the cancer centers. If you would like for your care team to wear a mask while they are taking care of you, please let them know. For doctor visits, patients may have with them one support person who is at least 70 years old. At this time, visitors are not allowed in the infusion area.

## 2022-04-02 MED FILL — Dexamethasone Sodium Phosphate Inj 100 MG/10ML: INTRAMUSCULAR | Qty: 1 | Status: AC

## 2022-04-03 ENCOUNTER — Inpatient Hospital Stay: Payer: Medicare Other

## 2022-04-03 ENCOUNTER — Inpatient Hospital Stay (HOSPITAL_BASED_OUTPATIENT_CLINIC_OR_DEPARTMENT_OTHER): Payer: Medicare Other | Admitting: Hematology and Oncology

## 2022-04-03 ENCOUNTER — Other Ambulatory Visit: Payer: Self-pay

## 2022-04-03 ENCOUNTER — Encounter: Payer: Self-pay | Admitting: Hematology and Oncology

## 2022-04-03 VITALS — Resp 17

## 2022-04-03 DIAGNOSIS — N1330 Unspecified hydronephrosis: Secondary | ICD-10-CM | POA: Diagnosis not present

## 2022-04-03 DIAGNOSIS — E538 Deficiency of other specified B group vitamins: Secondary | ICD-10-CM

## 2022-04-03 DIAGNOSIS — D61818 Other pancytopenia: Secondary | ICD-10-CM

## 2022-04-03 DIAGNOSIS — Z7189 Other specified counseling: Secondary | ICD-10-CM

## 2022-04-03 DIAGNOSIS — C539 Malignant neoplasm of cervix uteri, unspecified: Secondary | ICD-10-CM

## 2022-04-03 DIAGNOSIS — Z5111 Encounter for antineoplastic chemotherapy: Secondary | ICD-10-CM | POA: Diagnosis not present

## 2022-04-03 LAB — CBC WITH DIFFERENTIAL (CANCER CENTER ONLY)
Abs Immature Granulocytes: 0.13 10*3/uL — ABNORMAL HIGH (ref 0.00–0.07)
Basophils Absolute: 0.1 10*3/uL (ref 0.0–0.1)
Basophils Relative: 1 %
Eosinophils Absolute: 0.1 10*3/uL (ref 0.0–0.5)
Eosinophils Relative: 2 %
HCT: 29.5 % — ABNORMAL LOW (ref 36.0–46.0)
Hemoglobin: 9.7 g/dL — ABNORMAL LOW (ref 12.0–15.0)
Immature Granulocytes: 2 %
Lymphocytes Relative: 13 %
Lymphs Abs: 0.7 10*3/uL (ref 0.7–4.0)
MCH: 30.8 pg (ref 26.0–34.0)
MCHC: 32.9 g/dL (ref 30.0–36.0)
MCV: 93.7 fL (ref 80.0–100.0)
Monocytes Absolute: 0.7 10*3/uL (ref 0.1–1.0)
Monocytes Relative: 13 %
Neutro Abs: 3.8 10*3/uL (ref 1.7–7.7)
Neutrophils Relative %: 69 %
Platelet Count: 189 10*3/uL (ref 150–400)
RBC: 3.15 MIL/uL — ABNORMAL LOW (ref 3.87–5.11)
RDW: 14.6 % (ref 11.5–15.5)
WBC Count: 5.5 10*3/uL (ref 4.0–10.5)
nRBC: 0 % (ref 0.0–0.2)

## 2022-04-03 LAB — CMP (CANCER CENTER ONLY)
ALT: 7 U/L (ref 0–44)
AST: 13 U/L — ABNORMAL LOW (ref 15–41)
Albumin: 4 g/dL (ref 3.5–5.0)
Alkaline Phosphatase: 44 U/L (ref 38–126)
Anion gap: 5 (ref 5–15)
BUN: 19 mg/dL (ref 8–23)
CO2: 29 mmol/L (ref 22–32)
Calcium: 8.8 mg/dL — ABNORMAL LOW (ref 8.9–10.3)
Chloride: 109 mmol/L (ref 98–111)
Creatinine: 1.18 mg/dL — ABNORMAL HIGH (ref 0.44–1.00)
GFR, Estimated: 50 mL/min — ABNORMAL LOW (ref >60)
Glucose, Bld: 96 mg/dL (ref 70–99)
Potassium: 4.2 mmol/L (ref 3.5–5.1)
Sodium: 143 mmol/L (ref 135–145)
Total Bilirubin: 0.3 mg/dL (ref 0.3–1.2)
Total Protein: 6.6 g/dL (ref 6.5–8.1)

## 2022-04-03 MED ORDER — SODIUM CHLORIDE 0.9 % IV SOLN: Freq: Once | INTRAVENOUS | Status: AC

## 2022-04-03 MED ORDER — HEPARIN SOD (PORK) LOCK FLUSH 100 UNIT/ML IV SOLN
500.0000 [IU] | Freq: Once | INTRAVENOUS | Status: AC | PRN
  Administered 2022-04-03: 500 [IU]

## 2022-04-03 MED ORDER — FAMOTIDINE IN NACL 20-0.9 MG/50ML-% IV SOLN
20.0000 mg | Freq: Once | INTRAVENOUS | Status: AC
  Administered 2022-04-03: 20 mg via INTRAVENOUS
  Filled 2022-04-03: qty 50

## 2022-04-03 MED ORDER — SODIUM CHLORIDE 0.9 % IV SOLN
80.0000 mg/m2 | Freq: Once | INTRAVENOUS | Status: AC
  Administered 2022-04-03: 108 mg via INTRAVENOUS
  Filled 2022-04-03: qty 18

## 2022-04-03 MED ORDER — SODIUM CHLORIDE 0.9% FLUSH
10.0000 mL | INTRAVENOUS | Status: DC | PRN
  Administered 2022-04-03: 10 mL

## 2022-04-03 MED ORDER — SODIUM CHLORIDE 0.9 % IV SOLN
10.0000 mg | Freq: Once | INTRAVENOUS | Status: AC
  Administered 2022-04-03: 10 mg via INTRAVENOUS
  Filled 2022-04-03: qty 10

## 2022-04-03 MED ORDER — DIPHENHYDRAMINE HCL 50 MG/ML IJ SOLN
50.0000 mg | Freq: Once | INTRAMUSCULAR | Status: AC
  Administered 2022-04-03: 50 mg via INTRAVENOUS
  Filled 2022-04-03: qty 1

## 2022-04-03 MED ORDER — SODIUM CHLORIDE 0.9% FLUSH
10.0000 mL | Freq: Once | INTRAVENOUS | Status: AC
  Administered 2022-04-03: 10 mL

## 2022-04-03 NOTE — Progress Notes (Signed)
Per Dr Alvy Bimler, pt to receive b12 Aug 4.

## 2022-04-03 NOTE — Progress Notes (Signed)
[facility] OFFICE PROGRESS NOTE  Patient Care Team: Default, Provider, MD as PCP - General  ASSESSMENT & PLAN:  Malignant neoplasm of cervix ([place]) So far, she tolerated single agent Taxol without difficulties Her blood count looks stable We will continue modified treatment as scheduled CT imaging show positive response to treatment She will continue her treatment as scheduled I plan to repeat imaging study again end of September  Vitamin B12 deficiency We will continue B12 injections monthly I plan to monitor her B12 closely, repeat level is slightly better  Hydronephrosis of right kidney She has compromised renal function due to hydronephrosis but overall asymptomatic I do not recommend stent placement long-term  No orders of the defined types were placed in this encounter.   All questions were answered. The patient knows to call the clinic with any problems, questions or concerns. The total time spent in the appointment was 20 minutes encounter with patients including review of chart and various tests results, discussions about plan of care and coordination of care plan   [name], MD 04/03/2022 2:35 PM  INTERVAL HISTORY: Please see below for problem oriented charting. she returns for treatment follow-up on single agent Taxol She denies peripheral neuropathy or side effects from Taxol  REVIEW OF SYSTEMS:   Constitutional: Denies fevers, chills or abnormal weight loss Eyes: Denies blurriness of vision Ears, nose, mouth, throat, and face: Denies mucositis or sore throat Respiratory: Denies cough, dyspnea or wheezes Cardiovascular: Denies palpitation, chest discomfort or lower extremity swelling Gastrointestinal:  Denies nausea, heartburn or change in bowel habits Skin: Denies abnormal skin rashes Lymphatics: Denies new lymphadenopathy or easy bruising Neurological:Denies numbness, tingling or new weaknesses Behavioral/Psych: Mood is stable, no new  changes  All other systems were reviewed with the patient and are negative.  I have reviewed the past medical history, past surgical history, social history and family history with the patient and they are unchanged from previous note.  ALLERGIES:  has No Known Allergies.  MEDICATIONS:  Current Outpatient Medications  Medication Sig Dispense Refill   levothyroxine (SYNTHROID) 75 MCG tablet Take 1 tablet (75 mcg total) by mouth daily before breakfast. 30 tablet 2   lidocaine-prilocaine (EMLA) cream Apply to affected area once 30 g 3   Multiple Vitamin (MULTIVITAMIN) tablet Take 1 tablet by mouth daily.     ondansetron (ZOFRAN) 8 MG tablet Take 1 tablet (8 mg total) by mouth 2 (two) times daily as needed (Nausea or vomiting). 30 tablet 1   prochlorperazine (COMPAZINE) 10 MG tablet Take 1 tablet (10 mg total) by mouth every 6 (six) hours as needed (Nausea or vomiting). 30 tablet 1   No current facility-administered medications for this visit.   Facility-Administered Medications Ordered in Other Visits  Medication Dose Route Frequency Provider Last Rate Last Admin   sodium chloride flush (NS) 0.9 % injection 10 mL  10 mL Intracatheter PRN Alvy Bimler, Dmiya Malphrus, MD   10 mL at 04/03/22 1330    SUMMARY OF ONCOLOGIC HISTORY: Oncology History Overview Note  Cancer Staging Malignant neoplasm of cervix ([facility]) Staging form: Cervix Uteri, AJCC 8th Edition - Clinical: Stage IIIB (cT3b, cN1, cM0) - Signed by [name], MD on 05/04/2018  PD-L1 1% Progressed on carboplatin, pembrolizumab and cisplatin   Malignant neoplasm of cervix ([facility] [place])  03/13/2018 Imaging   US pelvis There are mobile echogenic foci within the endometrial cavity suggestive of gas. This is nonspecific in etiology however may be secondary to endometritis. Correlate for history of recent  instrumentation. This obscures visualization of the endometrial tissue and therefore a follow-up pelvic ultrasound in 2-4 weeks is recommended after  resolution of the acute symptomatology if symptoms persist to further evaluate the endometrium.     03/13/2018 Initial Diagnosis   She initially presented with PMB   04/12/2018 Pathology Results   Cervix, biopsy, mass - INVASIVE SQUAMOUS CELL CARCINOMA - SEE COMMENT   04/12/2018 Surgery   PREOPERATIVE DIAGNOSIS:  Postmenopausal vaginal bleeding. POSTOPERATIVE DIAGNOSIS: The same PROCEDURE: Exam under anesthesia, pap smear, cervical mass biopsy SURGEON:  Dr. [name]   INDICATIONS: 71 y.o. yo [id number] with PMB here for exam under anesthesia.Risks of surgery were discussed with the patient including but not limited to: bleeding which may require transfusion; infection which may require antibiotics; injury to uterus or surrounding organs; need for additional procedures including laparotomy or laparoscopy; and other postoperative/anesthesia complications. Written informed consent was obtained.     FINDINGS:  An 8-week size midline uterus.  No adnexal mass palpable on exam. Normal cervix not visualized. Friable mass seen in involving the vagina at the level of the cervix obliterating the anterior and posterior fornix.   ANESTHESIA:   General INTRAVENOUS FLUIDS:  300 ml of LR ESTIMATED BLOOD LOSS: 20 ml. SPECIMENS: pap smear, cervical mass biopsy COMPLICATIONS:  None immediate.     04/25/2018 PET scan   1. Large cervical mass may invade into the myometrium in down into the vagina, maximum SUV 21.3. There is a malignant left external iliac node measuring 1.4 cm in short axis with maximum SUV 14.1. 2. Accentuated activity in the cecum and ascending colon is likely physiologic given that it has no CT correlate. Correlation with the patient's colon cancer screening history is recommended. If screening is not up-to-date, appropriate screening should be considered. 3.  Aortic Atherosclerosis (ICD10-I70.0).     04/28/2018 Surgery   Pre-operative Diagnosis:  At least Stage 2B SCCa Cervical  Cancer   Post-operative Diagnosis: At least clinical Stage 3A SCCa Cervical Cancer   Operation:  Exam under anesthesia due to intolerance for vaginal exam in office Cystoscopy due to concern for extension to posterior bladder (from anterior vaginal wall lesion)   Operative Findings:   Parametrial involvement on right  Right sided subvaginal tumor burden ~3cm. This approximates the right ureteral insertion into the bladder based on my exam during cystoscopy. Extension of disease down upper half of vagina on lateral and posterior walls. Extension of disease down to lower third of anterior vagina (versus skip lesion) thus making her Stage 3A Cystoscopy revealed patent bilateral ureteral orifices and no bladder mucosa invasion or areas of concern. Rectal exam grossly no disease.     05/04/2018 Cancer Staging   Staging form: Cervix Uteri, AJCC 8th Edition - Clinical: Stage IIIB (cT3b, cN1, cM0) - Signed by [name], MD on 05/04/2018   05/12/2018 Procedure   Successful 8 French right internal jugular vein power port placement with its tip at the SVC/RA junction   05/13/2018 - 06/24/2018 Chemotherapy   The patient had weekly cisplatin given with concurrent radiation    05/17/2018 - 07/27/2018 Radiation Therapy   Radiation treatment dates:   05/17/2018-07/27/2018   Site/dose: 1. Cervix, 1.8 Gy in 25 fractions for a total dose of 45 Gy                    2. Pelvis  Boost, 1.8 Gy in 5 fractions for a total dose of 9 Gy  3. Cervix, 5.5 Gy in 5 fractions for a total dose of 27.5 Gy   06/03/2018 Adverse Reaction   She missed her chemo due to severe depression and was sent to the ER for suicidal ideation.  She was seen by psychiatrist.   10/27/2018 PET scan   IMPRESSION: 1. Complete metabolic response to therapy, without residual or recurrent hypermetabolic disease. 2.  Aortic Atherosclerosis (ICD10-I70.0).     11/17/2018 Imaging   Successful right IJ vein Port-A-Cath  explant.   03/29/2020 PET scan   1. Hypermetabolic lesion along the right vaginal cuff is highly worrisome for recurrent cervical cancer. No evidence of distant metastatic disease. 2.  Aortic atherosclerosis (ICD10-I70.0).   04/26/2020 - 12/10/2020 Chemotherapy   The patient had CARBOplatin  for chemotherapy treatment.      Genetic Testing   Patient has genetic testing done for PD-L1. Results revealed patient has the following:  PD-L1 combined positive score (CPS): 1%   07/08/2020 Imaging   1. Unchanged post treatment appearance of the pelvis, including a soft tissue nodule in the low right hemipelvis adjacent to the vagina measuring 2.8 x 1.7 cm, previously FDG avid and consistent with malignancy. 2. No evidence of nodal or distant metastatic disease in the abdomen or pelvis. 3. Aortic Atherosclerosis (ICD10-I70.0).   10/14/2020 Imaging   1. Unchanged right eccentric soft tissue mass in the low right hemipelvis centered on the cervix and vagina, measuring 2.9 x 1.9 cm. 2. New small volume fluid within the endometrial cavity, abnormal in the postmenopausal setting and suggesting obstruction of the cervical os by soft tissue mass. 3. No evidence of lymphadenopathy or distant metastatic disease in the abdomen or pelvis.   Aortic Atherosclerosis (ICD10-I70.0).   12/24/2020 Imaging   1. Abnormal hypodense masslike appearance continuous with hypodense masslike lesion probably centrally within a right eccentric cervix (versus right paracervical mass). The endometrial portion has enlarged compared to 10/14/2020 and active malignancy is suspected. 2. No adenopathy or findings of distant spread. 3. Other imaging findings of potential clinical significance: Aortic Atherosclerosis (ICD10-I70.0). Mild cardiomegaly. Stable scarring in the left lower lobe. Sigmoid colon scattered diverticula. Tarlov cysts at the S2 level. Vertical sclerosis in the sacral ala possibly due to stress fractures or prior  radiation therapy.     01/06/2021 - 06/19/2021 Chemotherapy   Patient is on Treatment Plan : UTERINE Pembrolizumab q21d      04/07/2021 Imaging   1. The previously hypermetabolic right vaginal mass currently measures 12 cubic cm in volume, previously 11 cubic cm on 12/23/2020. 2. No new adenopathy or new findings of distant metastatic spread. 3. New wall thickening in several loops of distal ileum suspicious for inflammation/enteritis. 4. Other imaging findings of potential clinical significance: Mild cardiomegaly. Mild sigmoid colon diverticulosis. Stable bony demineralization with vertical sclerosis in the sacral ala likely from old insufficiency fracture.   07/10/2021 Imaging   1. New moderate right-sided hydroureteronephrosis with decreased perfusion to the right kidney compatible with obstructive uropathy.  2. Interval progression of the heterogeneous low-density uterine lesion, with progression of enhancing soft tissue extending inferiorly to the right, compatible with previously described disease in the right vaginal wall. Represents the site of right ureteral obstruction. 3. No evidence for new metastatic disease in the abdomen or pelvis. 4. Bilateral sacral insufficiency fractures. 5. Trace free fluid in the pelvis. 6. Aortic Atherosclerosis (ICD10-I70.0).     07/22/2021 Procedure   Successful placement of a right internal jugular approach power injectable Port-A-Cath.     07/24/2021 -  10/02/2021 Chemotherapy   Patient is on Treatment Plan : cervical cancer Cisplatin q7d       10/16/2021 Imaging   1. Mild interval increase in size of heterogeneous tumor arising from within the uterus with local tumor extension into the right pelvic sidewall. 2. Persistent right-sided obstructive uropathy with severe right hydronephrosis and progressive right renal cortical volume loss. Right hydroureter extends into the pelvis where it is obstructed by the uterine/cervical mass. 3. Bilateral sacral  insufficiency fractures. 4. Aortic Atherosclerosis (ICD10-I70.0).   11/06/2021 -  Chemotherapy   Patient is on Treatment Plan : Cervical Paclitaxel q7d     02/10/2022 Imaging   1. Slight interval decrease in size of a heterogeneous mass within the right pelvis, involving the adjacent right aspect of the uterus and closely abutting the posterior urinary bladder,, consistent with treatment response. Fat stranding and presacral soft tissue thickening in the adjacent pelvis, unchanged. 2. Unchanged severe right hydronephrosis and hydroureter, the distal right ureter obstructed by mass in the pelvis, again with slight progression of right renal cortical atrophy. 3. Unchanged, mildly sclerotic nondisplaced bilateral sacral insufficiency fractures.   Aortic Atherosclerosis (ICD10-I70.0).     PHYSICAL EXAMINATION: ECOG PERFORMANCE STATUS: 1 - Symptomatic but completely ambulatory  Vitals:   04/03/22 1029  BP: (!) 145/71  Pulse: 80  Temp: 99.8 F (37.7 C)  SpO2: 98%   Filed Weights   04/03/22 1029  Weight: 91 lb 9.6 oz (41.5 kg)    GENERAL:alert, no distress and comfortable NEURO: alert & oriented x 3 with fluent speech, no focal motor/sensory deficits  LABORATORY DATA:  I have reviewed the data as listed    Component Value Date/Time   NA 143 04/03/2022 1016   K 4.2 04/03/2022 1016   CL 109 04/03/2022 1016   CO2 29 04/03/2022 1016   GLUCOSE 96 04/03/2022 1016   BUN 19 04/03/2022 1016   CREATININE 1.18 (H) 04/03/2022 1016   CALCIUM 8.8 (L) 04/03/2022 1016   PROT 6.6 04/03/2022 1016   ALBUMIN 4.0 04/03/2022 1016   AST 13 (L) 04/03/2022 1016   ALT 7 04/03/2022 1016   ALKPHOS 44 04/03/2022 1016   BILITOT 0.3 04/03/2022 1016   GFRNONAA 50 (L) 04/03/2022 1016   GFRAA >60 06/14/2020 0844   GFRAA >60 03/11/2020 0951    No results found for: "SPEP", "UPEP"  Lab Results  Component Value Date   WBC 5.5 04/03/2022   NEUTROABS 3.8 04/03/2022   HGB 9.7 (L) 04/03/2022   HCT 29.5  (L) 04/03/2022   MCV 93.7 04/03/2022   PLT 189 04/03/2022      Chemistry      Component Value Date/Time   NA 143 04/03/2022 1016   K 4.2 04/03/2022 1016   CL 109 04/03/2022 1016   CO2 29 04/03/2022 1016   BUN 19 04/03/2022 1016   CREATININE 1.18 (H) 04/03/2022 1016      Component Value Date/Time   CALCIUM 8.8 (L) 04/03/2022 1016   ALKPHOS 44 04/03/2022 1016   AST 13 (L) 04/03/2022 1016   ALT 7 04/03/2022 1016   BILITOT 0.3 04/03/2022 1016

## 2022-04-03 NOTE — Assessment & Plan Note (Signed)
She has compromised renal function due to hydronephrosis but overall asymptomatic I do not recommend stent placement long-term 

## 2022-04-03 NOTE — Assessment & Plan Note (Signed)
So far, she tolerated single agent Taxol without difficulties Her blood count looks stable We will continue modified treatment as scheduled CT imaging show positive response to treatment She will continue her treatment as scheduled I plan to repeat imaging study again end of September

## 2022-04-03 NOTE — Assessment & Plan Note (Signed)
We will continue B12 injections monthly I plan to monitor her B12 closely, repeat level is slightly better 

## 2022-04-03 NOTE — Patient Instructions (Signed)
[facility] ONCOLOG[facility]  Discharge Instructions: Thank you for choosing [facility] to provide your oncology and hematology care.   If you have a lab appointment with the [facility], please go directly to the [facility] and check in at the registration area.   Wear comfortable clothing and clothing appropriate for easy access to any Portacath or PICC line.   We strive to give you quality time with your provider. You may need to reschedule your appointment if you arrive late (15 or more minutes).  Arriving late affects you and other patients whose appointments are after yours.  Also, if you miss three or more appointments without notifying the office, you may be dismissed from the clinic at the provider's discretion.      For prescription refill requests, have your pharmacy contact our office and allow 72 hours for refills to be completed.    Today you received the following chemotherapy and/or immunotherapy agents: Paclitaxel      To help prevent nausea and vomiting after your treatment, we encourage you to take your nausea medication as directed.  BELOW ARE SYMPTOMS THAT SHOULD BE REPORTED IMMEDIATELY: *FEVER GREATER THAN 100.4 F (38 C) OR HIGHER *CHILLS OR SWEATING *NAUSEA AND VOMITING THAT IS NOT CONTROLLED WITH YOUR NAUSEA MEDICATION *UNUSUAL SHORTNESS OF BREATH *UNUSUAL BRUISING OR BLEEDING *URINARY PROBLEMS (pain or burning when urinating, or frequent urination) *BOWEL PROBLEMS (unusual diarrhea, constipation, pain near the anus) TENDERNESS IN MOUTH AND THROAT WITH OR WITHOUT PRESENCE OF ULCERS (sore throat, sores in mouth, or a toothache) UNUSUAL RASH, SWELLING OR PAIN  UNUSUAL VAGINAL DISCHARGE OR ITCHING   Items with * indicate a potential emergency and should be followed up as soon as possible or go to the Emergency Department if any problems should occur.  Please show the CHEMOTHERAPY ALERT CARD or IMMUNOTHERAPY ALERT CARD at check-in to  the Emergency Department and triage nurse.  Should you have questions after your visit or need to cancel or reschedule your appointment, please contact [facility]  Dept: [phone]  and follow the prompts.  Office hours are 8:00 a.m. to 4:30 p.m. Monday - Friday. Please note that voicemails left after 4:00 p.m. may not be returned until the following business day.  We are closed weekends and major holidays. You have access to a nurse at all times for urgent questions. Please call the main number to the clinic Dept: [phone] and follow the prompts.   For any non-urgent questions, you may also contact your provider using MyChart. We now offer e-Visits for anyone 38 and older to request care online for non-urgent symptoms. For details visit [email or web address].   Also download the MyChart app! Go to the app store, search "MyChart", open the app, select [facility], and log in with your MyChart username and password.  Masks are optional in the cancer centers. If you would like for your care team to wear a mask while they are taking care of you, please let them know. For doctor visits, patients may have with them one support person who is at least 70 years old. At this time, visitors are not allowed in the infusion area.

## 2022-04-06 ENCOUNTER — Other Ambulatory Visit: Payer: Self-pay

## 2022-04-07 ENCOUNTER — Other Ambulatory Visit: Payer: Self-pay

## 2022-04-16 MED FILL — Dexamethasone Sodium Phosphate Inj 100 MG/10ML: INTRAMUSCULAR | Qty: 1 | Status: AC

## 2022-04-17 ENCOUNTER — Other Ambulatory Visit: Payer: Self-pay

## 2022-04-17 ENCOUNTER — Inpatient Hospital Stay: Payer: Medicare Other

## 2022-04-17 ENCOUNTER — Inpatient Hospital Stay: Payer: Medicare Other | Attending: Hematology and Oncology

## 2022-04-17 VITALS — BP 133/84 | HR 84 | Temp 98.1°F | Resp 17 | Wt 91.0 lb

## 2022-04-17 DIAGNOSIS — N1330 Unspecified hydronephrosis: Secondary | ICD-10-CM | POA: Insufficient documentation

## 2022-04-17 DIAGNOSIS — Z923 Personal history of irradiation: Secondary | ICD-10-CM | POA: Diagnosis not present

## 2022-04-17 DIAGNOSIS — D61818 Other pancytopenia: Secondary | ICD-10-CM

## 2022-04-17 DIAGNOSIS — E538 Deficiency of other specified B group vitamins: Secondary | ICD-10-CM | POA: Insufficient documentation

## 2022-04-17 DIAGNOSIS — C539 Malignant neoplasm of cervix uteri, unspecified: Secondary | ICD-10-CM | POA: Insufficient documentation

## 2022-04-17 DIAGNOSIS — I517 Cardiomegaly: Secondary | ICD-10-CM | POA: Insufficient documentation

## 2022-04-17 DIAGNOSIS — Z79890 Hormone replacement therapy (postmenopausal): Secondary | ICD-10-CM | POA: Diagnosis not present

## 2022-04-17 DIAGNOSIS — Z7189 Other specified counseling: Secondary | ICD-10-CM

## 2022-04-17 DIAGNOSIS — Z5111 Encounter for antineoplastic chemotherapy: Secondary | ICD-10-CM | POA: Insufficient documentation

## 2022-04-17 DIAGNOSIS — I700 Atherosclerosis of aorta: Secondary | ICD-10-CM | POA: Insufficient documentation

## 2022-04-17 DIAGNOSIS — J984 Other disorders of lung: Secondary | ICD-10-CM | POA: Insufficient documentation

## 2022-04-17 DIAGNOSIS — Z9221 Personal history of antineoplastic chemotherapy: Secondary | ICD-10-CM | POA: Insufficient documentation

## 2022-04-17 DIAGNOSIS — K5730 Diverticulosis of large intestine without perforation or abscess without bleeding: Secondary | ICD-10-CM | POA: Diagnosis not present

## 2022-04-17 DIAGNOSIS — D539 Nutritional anemia, unspecified: Secondary | ICD-10-CM | POA: Insufficient documentation

## 2022-04-17 LAB — CMP (CANCER CENTER ONLY)
ALT: 7 U/L (ref 0–44)
AST: 15 U/L (ref 15–41)
Albumin: 4.2 g/dL (ref 3.5–5.0)
Alkaline Phosphatase: 50 U/L (ref 38–126)
Anion gap: 7 (ref 5–15)
BUN: 20 mg/dL (ref 8–23)
CO2: 27 mmol/L (ref 22–32)
Calcium: 8.9 mg/dL (ref 8.9–10.3)
Chloride: 104 mmol/L (ref 98–111)
Creatinine: 1.16 mg/dL — ABNORMAL HIGH (ref 0.44–1.00)
GFR, Estimated: 51 mL/min — ABNORMAL LOW (ref >60)
Glucose, Bld: 93 mg/dL (ref 70–99)
Potassium: 4.2 mmol/L (ref 3.5–5.1)
Sodium: 138 mmol/L (ref 135–145)
Total Bilirubin: 0.3 mg/dL (ref 0.3–1.2)
Total Protein: 7.3 g/dL (ref 6.5–8.1)

## 2022-04-17 LAB — CBC WITH DIFFERENTIAL (CANCER CENTER ONLY)
Abs Immature Granulocytes: 0.14 10*3/uL — ABNORMAL HIGH (ref 0.00–0.07)
Basophils Absolute: 0.1 10*3/uL (ref 0.0–0.1)
Basophils Relative: 1 %
Eosinophils Absolute: 0.1 10*3/uL (ref 0.0–0.5)
Eosinophils Relative: 1 %
HCT: 31.6 % — ABNORMAL LOW (ref 36.0–46.0)
Hemoglobin: 10.4 g/dL — ABNORMAL LOW (ref 12.0–15.0)
Immature Granulocytes: 2 %
Lymphocytes Relative: 15 %
Lymphs Abs: 0.9 10*3/uL (ref 0.7–4.0)
MCH: 30.3 pg (ref 26.0–34.0)
MCHC: 32.9 g/dL (ref 30.0–36.0)
MCV: 92.1 fL (ref 80.0–100.0)
Monocytes Absolute: 1 10*3/uL (ref 0.1–1.0)
Monocytes Relative: 16 %
Neutro Abs: 4.1 10*3/uL (ref 1.7–7.7)
Neutrophils Relative %: 65 %
Platelet Count: 260 10*3/uL (ref 150–400)
RBC: 3.43 MIL/uL — ABNORMAL LOW (ref 3.87–5.11)
RDW: 14.8 % (ref 11.5–15.5)
WBC Count: 6.3 10*3/uL (ref 4.0–10.5)
nRBC: 0 % (ref 0.0–0.2)

## 2022-04-17 MED ORDER — SODIUM CHLORIDE 0.9 % IV SOLN
10.0000 mg | Freq: Once | INTRAVENOUS | Status: AC
  Administered 2022-04-17: 10 mg via INTRAVENOUS
  Filled 2022-04-17: qty 10

## 2022-04-17 MED ORDER — SODIUM CHLORIDE 0.9 % IV SOLN: Freq: Once | INTRAVENOUS | Status: AC

## 2022-04-17 MED ORDER — SODIUM CHLORIDE 0.9 % IV SOLN
80.0000 mg/m2 | Freq: Once | INTRAVENOUS | Status: AC
  Administered 2022-04-17: 108 mg via INTRAVENOUS
  Filled 2022-04-17: qty 18

## 2022-04-17 MED ORDER — HEPARIN SOD (PORK) LOCK FLUSH 100 UNIT/ML IV SOLN
500.0000 [IU] | Freq: Once | INTRAVENOUS | Status: AC | PRN
  Administered 2022-04-17: 500 [IU]

## 2022-04-17 MED ORDER — DIPHENHYDRAMINE HCL 50 MG/ML IJ SOLN
50.0000 mg | Freq: Once | INTRAMUSCULAR | Status: AC
  Administered 2022-04-17: 50 mg via INTRAVENOUS
  Filled 2022-04-17: qty 1

## 2022-04-17 MED ORDER — SODIUM CHLORIDE 0.9% FLUSH
10.0000 mL | Freq: Once | INTRAVENOUS | Status: AC
  Administered 2022-04-17: 10 mL

## 2022-04-17 MED ORDER — CYANOCOBALAMIN 1000 MCG/ML IJ SOLN
1000.0000 ug | Freq: Once | INTRAMUSCULAR | Status: AC
  Administered 2022-04-17: 1000 ug via INTRAMUSCULAR
  Filled 2022-04-17: qty 1

## 2022-04-17 MED ORDER — SODIUM CHLORIDE 0.9% FLUSH
10.0000 mL | INTRAVENOUS | Status: DC | PRN
  Administered 2022-04-17: 10 mL

## 2022-04-17 MED ORDER — FAMOTIDINE IN NACL 20-0.9 MG/50ML-% IV SOLN
20.0000 mg | Freq: Once | INTRAVENOUS | Status: AC
  Administered 2022-04-17: 20 mg via INTRAVENOUS
  Filled 2022-04-17: qty 50

## 2022-04-17 NOTE — Patient Instructions (Signed)
[facility]   Discharge Instructions: Thank you for choosing [facility] to provide your oncology and hematology care.   If you have a lab appointment with the Cancer Center, please go directly to the Cancer Center and check in at the registration area.   Wear comfortable clothing and clothing appropriate for easy access to any Portacath or PICC line.   We strive to give you quality time with your provider. You may need to reschedule your appointment if you arrive late (15 or more minutes).  Arriving late affects you and other patients whose appointments are after yours.  Also, if you miss three or more appointments without notifying the office, you may be dismissed from the clinic at the provider's discretion.      For prescription refill requests, have your pharmacy contact our office and allow 72 hours for refills to be completed.    Today you received the following chemotherapy and/or immunotherapy agents: paclitaxel      To help prevent nausea and vomiting after your treatment, we encourage you to take your nausea medication as directed.  BELOW ARE SYMPTOMS THAT SHOULD BE REPORTED IMMEDIATELY: *FEVER GREATER THAN 100.4 F (38 C) OR HIGHER *CHILLS OR SWEATING *NAUSEA AND VOMITING THAT IS NOT CONTROLLED WITH YOUR NAUSEA MEDICATION *UNUSUAL SHORTNESS OF BREATH *UNUSUAL BRUISING OR BLEEDING *URINARY PROBLEMS (pain or burning when urinating, or frequent urination) *BOWEL PROBLEMS (unusual diarrhea, constipation, pain near the anus) TENDERNESS IN MOUTH AND THROAT WITH OR WITHOUT PRESENCE OF ULCERS (sore throat, sores in mouth, or a toothache) UNUSUAL RASH, SWELLING OR PAIN  UNUSUAL VAGINAL DISCHARGE OR ITCHING   Items with * indicate a potential emergency and should be followed up as soon as possible or go to the Emergency Department if any problems should occur.  Please show the CHEMOTHERAPY ALERT CARD or IMMUNOTHERAPY ALERT CARD at check-in  to the Emergency Department and triage nurse.  Should you have questions after your visit or need to cancel or reschedule your appointment, please contact [facility]  Dept: [phone]  and follow the prompts.  Office hours are 8:00 a.m. to 4:30 p.m. Monday - Friday. Please note that voicemails left after 4:00 p.m. may not be returned until the following business day.  We are closed weekends and major holidays. You have access to a nurse at all times for urgent questions. Please call the main number to the clinic Dept: [phone] and follow the prompts.   For any non-urgent questions, you may also contact your provider using MyChart. We now offer e-Visits for anyone 18 and older to request care online for non-urgent symptoms. For details visit [email or web address].   Also download the MyChart app! Go to the app store, search "MyChart", open the app, select [facility], and log in with your MyChart username and password.  Masks are optional in the cancer centers. If you would like for your care team to wear a mask while they are taking care of you, please let them know. You may have one support person who is at least 70 years old accompany you for your appointments. 

## 2022-04-23 MED FILL — Dexamethasone Sodium Phosphate Inj 100 MG/10ML: INTRAMUSCULAR | Qty: 1 | Status: AC

## 2022-04-24 ENCOUNTER — Other Ambulatory Visit: Payer: Self-pay

## 2022-04-24 ENCOUNTER — Inpatient Hospital Stay: Payer: Medicare Other

## 2022-04-24 VITALS — BP 150/76 | HR 80 | Temp 98.1°F | Resp 18 | Ht 62.0 in | Wt 91.8 lb

## 2022-04-24 DIAGNOSIS — E039 Hypothyroidism, unspecified: Secondary | ICD-10-CM

## 2022-04-24 DIAGNOSIS — C539 Malignant neoplasm of cervix uteri, unspecified: Secondary | ICD-10-CM

## 2022-04-24 DIAGNOSIS — Z5111 Encounter for antineoplastic chemotherapy: Secondary | ICD-10-CM | POA: Diagnosis not present

## 2022-04-24 DIAGNOSIS — D61818 Other pancytopenia: Secondary | ICD-10-CM

## 2022-04-24 DIAGNOSIS — E538 Deficiency of other specified B group vitamins: Secondary | ICD-10-CM

## 2022-04-24 DIAGNOSIS — Z7189 Other specified counseling: Secondary | ICD-10-CM

## 2022-04-24 LAB — CMP (CANCER CENTER ONLY)
ALT: 7 U/L (ref 0–44)
AST: 13 U/L — ABNORMAL LOW (ref 15–41)
Albumin: 4.2 g/dL (ref 3.5–5.0)
Alkaline Phosphatase: 47 U/L (ref 38–126)
Anion gap: 5 (ref 5–15)
BUN: 20 mg/dL (ref 8–23)
CO2: 29 mmol/L (ref 22–32)
Calcium: 8.9 mg/dL (ref 8.9–10.3)
Chloride: 107 mmol/L (ref 98–111)
Creatinine: 1.12 mg/dL — ABNORMAL HIGH (ref 0.44–1.00)
GFR, Estimated: 53 mL/min — ABNORMAL LOW (ref >60)
Glucose, Bld: 87 mg/dL (ref 70–99)
Potassium: 4.3 mmol/L (ref 3.5–5.1)
Sodium: 141 mmol/L (ref 135–145)
Total Bilirubin: 0.3 mg/dL (ref 0.3–1.2)
Total Protein: 6.9 g/dL (ref 6.5–8.1)

## 2022-04-24 LAB — CBC WITH DIFFERENTIAL (CANCER CENTER ONLY)
Abs Immature Granulocytes: 0.1 10*3/uL — ABNORMAL HIGH (ref 0.00–0.07)
Basophils Absolute: 0.1 10*3/uL (ref 0.0–0.1)
Basophils Relative: 1 %
Eosinophils Absolute: 0.1 10*3/uL (ref 0.0–0.5)
Eosinophils Relative: 2 %
HCT: 30.7 % — ABNORMAL LOW (ref 36.0–46.0)
Hemoglobin: 10.1 g/dL — ABNORMAL LOW (ref 12.0–15.0)
Immature Granulocytes: 2 %
Lymphocytes Relative: 14 %
Lymphs Abs: 0.8 10*3/uL (ref 0.7–4.0)
MCH: 30.2 pg (ref 26.0–34.0)
MCHC: 32.9 g/dL (ref 30.0–36.0)
MCV: 91.9 fL (ref 80.0–100.0)
Monocytes Absolute: 0.6 10*3/uL (ref 0.1–1.0)
Monocytes Relative: 11 %
Neutro Abs: 3.7 10*3/uL (ref 1.7–7.7)
Neutrophils Relative %: 70 %
Platelet Count: 234 10*3/uL (ref 150–400)
RBC: 3.34 MIL/uL — ABNORMAL LOW (ref 3.87–5.11)
RDW: 14.6 % (ref 11.5–15.5)
WBC Count: 5.2 10*3/uL (ref 4.0–10.5)
nRBC: 0 % (ref 0.0–0.2)

## 2022-04-24 LAB — TSH: TSH: 18.844 u[IU]/mL — ABNORMAL HIGH (ref 0.350–4.500)

## 2022-04-24 MED ORDER — FAMOTIDINE IN NACL 20-0.9 MG/50ML-% IV SOLN
20.0000 mg | Freq: Once | INTRAVENOUS | Status: AC
  Administered 2022-04-24: 20 mg via INTRAVENOUS

## 2022-04-24 MED ORDER — SODIUM CHLORIDE 0.9% FLUSH
10.0000 mL | Freq: Once | INTRAVENOUS | Status: AC
  Administered 2022-04-24: 10 mL

## 2022-04-24 MED ORDER — SODIUM CHLORIDE 0.9% FLUSH
10.0000 mL | INTRAVENOUS | Status: DC | PRN
  Administered 2022-04-24: 10 mL

## 2022-04-24 MED ORDER — DIPHENHYDRAMINE HCL 50 MG/ML IJ SOLN
INTRAMUSCULAR | Status: AC
  Filled 2022-04-24: qty 1

## 2022-04-24 MED ORDER — SODIUM CHLORIDE 0.9 % IV SOLN: Freq: Once | INTRAVENOUS | Status: AC

## 2022-04-24 MED ORDER — FAMOTIDINE IN NACL 20-0.9 MG/50ML-% IV SOLN
INTRAVENOUS | Status: AC
  Filled 2022-04-24: qty 50

## 2022-04-24 MED ORDER — DIPHENHYDRAMINE HCL 50 MG/ML IJ SOLN
50.0000 mg | Freq: Once | INTRAMUSCULAR | Status: AC
  Administered 2022-04-24: 50 mg via INTRAVENOUS

## 2022-04-24 MED ORDER — HEPARIN SOD (PORK) LOCK FLUSH 100 UNIT/ML IV SOLN
500.0000 [IU] | Freq: Once | INTRAVENOUS | Status: AC | PRN
  Administered 2022-04-24: 500 [IU]

## 2022-04-24 MED ORDER — SODIUM CHLORIDE 0.9 % IV SOLN
10.0000 mg | Freq: Once | INTRAVENOUS | Status: AC
  Administered 2022-04-24: 10 mg via INTRAVENOUS
  Filled 2022-04-24: qty 10

## 2022-04-24 MED ORDER — SODIUM CHLORIDE 0.9 % IV SOLN
80.0000 mg/m2 | Freq: Once | INTRAVENOUS | Status: AC
  Administered 2022-04-24: 108 mg via INTRAVENOUS
  Filled 2022-04-24: qty 18

## 2022-04-24 NOTE — Patient Instructions (Signed)
[facility]   Discharge Instructions: Thank you for choosing [facility] to provide your oncology and hematology care.   If you have a lab appointment with the Cancer Center, please go directly to the Cancer Center and check in at the registration area.   Wear comfortable clothing and clothing appropriate for easy access to any Portacath or PICC line.   We strive to give you quality time with your provider. You may need to reschedule your appointment if you arrive late (15 or more minutes).  Arriving late affects you and other patients whose appointments are after yours.  Also, if you miss three or more appointments without notifying the office, you may be dismissed from the clinic at the provider's discretion.      For prescription refill requests, have your pharmacy contact our office and allow 72 hours for refills to be completed.    Today you received the following chemotherapy and/or immunotherapy agents: paclitaxel      To help prevent nausea and vomiting after your treatment, we encourage you to take your nausea medication as directed.  BELOW ARE SYMPTOMS THAT SHOULD BE REPORTED IMMEDIATELY: *FEVER GREATER THAN 100.4 F (38 C) OR HIGHER *CHILLS OR SWEATING *NAUSEA AND VOMITING THAT IS NOT CONTROLLED WITH YOUR NAUSEA MEDICATION *UNUSUAL SHORTNESS OF BREATH *UNUSUAL BRUISING OR BLEEDING *URINARY PROBLEMS (pain or burning when urinating, or frequent urination) *BOWEL PROBLEMS (unusual diarrhea, constipation, pain near the anus) TENDERNESS IN MOUTH AND THROAT WITH OR WITHOUT PRESENCE OF ULCERS (sore throat, sores in mouth, or a toothache) UNUSUAL RASH, SWELLING OR PAIN  UNUSUAL VAGINAL DISCHARGE OR ITCHING   Items with * indicate a potential emergency and should be followed up as soon as possible or go to the Emergency Department if any problems should occur.  Please show the CHEMOTHERAPY ALERT CARD or IMMUNOTHERAPY ALERT CARD at check-in  to the Emergency Department and triage nurse.  Should you have questions after your visit or need to cancel or reschedule your appointment, please contact [facility]  Dept: [phone]  and follow the prompts.  Office hours are 8:00 a.m. to 4:30 p.m. Monday - Friday. Please note that voicemails left after 4:00 p.m. may not be returned until the following business day.  We are closed weekends and major holidays. You have access to a nurse at all times for urgent questions. Please call the main number to the clinic Dept: [phone] and follow the prompts.   For any non-urgent questions, you may also contact your provider using MyChart. We now offer e-Visits for anyone 18 and older to request care online for non-urgent symptoms. For details visit [email or web address].   Also download the MyChart app! Go to the app store, search "MyChart", open the app, select , and log in with your MyChart username and password.  Masks are optional in the cancer centers. If you would like for your care team to wear a mask while they are taking care of you, please let them know. You may have one support person who is at least 70 years old accompany you for your appointments. 

## 2022-04-27 ENCOUNTER — Telehealth: Payer: Self-pay

## 2022-04-27 ENCOUNTER — Other Ambulatory Visit: Payer: Self-pay

## 2022-04-27 ENCOUNTER — Other Ambulatory Visit: Payer: Self-pay | Admitting: Hematology and Oncology

## 2022-04-27 MED ORDER — LEVOTHYROXINE SODIUM 100 MCG PO TABS
100.0000 ug | ORAL_TABLET | Freq: Every day | ORAL | 0 refills | Status: DC
Start: 2022-04-27 — End: 2022-05-29

## 2022-04-27 NOTE — Telephone Encounter (Signed)
Called per Dr. Alvy Bimler, TSH high. New Rx sent to [facility] pharmacy for 100 mcg Synthorid. She verbalized understanding and will pick up the Rx.

## 2022-05-07 MED FILL — Dexamethasone Sodium Phosphate Inj 100 MG/10ML: INTRAMUSCULAR | Qty: 1 | Status: AC

## 2022-05-08 ENCOUNTER — Inpatient Hospital Stay: Payer: Medicare Other

## 2022-05-08 ENCOUNTER — Encounter: Payer: Self-pay | Admitting: Hematology and Oncology

## 2022-05-08 ENCOUNTER — Inpatient Hospital Stay (HOSPITAL_BASED_OUTPATIENT_CLINIC_OR_DEPARTMENT_OTHER): Payer: Medicare Other | Admitting: Hematology and Oncology

## 2022-05-08 ENCOUNTER — Other Ambulatory Visit: Payer: Self-pay

## 2022-05-08 VITALS — BP 135/74 | HR 84 | Temp 98.0°F | Resp 18

## 2022-05-08 VITALS — BP 148/76 | HR 88 | Resp 18 | Ht 62.0 in | Wt 95.4 lb

## 2022-05-08 DIAGNOSIS — D539 Nutritional anemia, unspecified: Secondary | ICD-10-CM

## 2022-05-08 DIAGNOSIS — Z5111 Encounter for antineoplastic chemotherapy: Secondary | ICD-10-CM | POA: Diagnosis not present

## 2022-05-08 DIAGNOSIS — E538 Deficiency of other specified B group vitamins: Secondary | ICD-10-CM

## 2022-05-08 DIAGNOSIS — C539 Malignant neoplasm of cervix uteri, unspecified: Secondary | ICD-10-CM

## 2022-05-08 DIAGNOSIS — Z7189 Other specified counseling: Secondary | ICD-10-CM

## 2022-05-08 DIAGNOSIS — N1330 Unspecified hydronephrosis: Secondary | ICD-10-CM | POA: Diagnosis not present

## 2022-05-08 DIAGNOSIS — D61818 Other pancytopenia: Secondary | ICD-10-CM

## 2022-05-08 LAB — CMP (CANCER CENTER ONLY)
ALT: 12 U/L (ref 0–44)
AST: 20 U/L (ref 15–41)
Albumin: 4.1 g/dL (ref 3.5–5.0)
Alkaline Phosphatase: 53 U/L (ref 38–126)
Anion gap: 6 (ref 5–15)
BUN: 21 mg/dL (ref 8–23)
CO2: 29 mmol/L (ref 22–32)
Calcium: 9.3 mg/dL (ref 8.9–10.3)
Chloride: 105 mmol/L (ref 98–111)
Creatinine: 0.98 mg/dL (ref 0.44–1.00)
GFR, Estimated: >60 mL/min (ref >60)
Glucose, Bld: 95 mg/dL (ref 70–99)
Potassium: 4.1 mmol/L (ref 3.5–5.1)
Sodium: 140 mmol/L (ref 135–145)
Total Bilirubin: 0.3 mg/dL (ref 0.3–1.2)
Total Protein: 6.8 g/dL (ref 6.5–8.1)

## 2022-05-08 LAB — CBC WITH DIFFERENTIAL (CANCER CENTER ONLY)
Abs Immature Granulocytes: 0.09 10*3/uL — ABNORMAL HIGH (ref 0.00–0.07)
Basophils Absolute: 0.1 10*3/uL (ref 0.0–0.1)
Basophils Relative: 1 %
Eosinophils Absolute: 0.1 10*3/uL (ref 0.0–0.5)
Eosinophils Relative: 2 %
HCT: 31.8 % — ABNORMAL LOW (ref 36.0–46.0)
Hemoglobin: 10.4 g/dL — ABNORMAL LOW (ref 12.0–15.0)
Immature Granulocytes: 2 %
Lymphocytes Relative: 15 %
Lymphs Abs: 0.9 10*3/uL (ref 0.7–4.0)
MCH: 30 pg (ref 26.0–34.0)
MCHC: 32.7 g/dL (ref 30.0–36.0)
MCV: 91.6 fL (ref 80.0–100.0)
Monocytes Absolute: 0.9 10*3/uL (ref 0.1–1.0)
Monocytes Relative: 16 %
Neutro Abs: 3.7 10*3/uL (ref 1.7–7.7)
Neutrophils Relative %: 64 %
Platelet Count: 238 10*3/uL (ref 150–400)
RBC: 3.47 MIL/uL — ABNORMAL LOW (ref 3.87–5.11)
RDW: 14.9 % (ref 11.5–15.5)
WBC Count: 5.8 10*3/uL (ref 4.0–10.5)
nRBC: 0 % (ref 0.0–0.2)

## 2022-05-08 MED ORDER — SODIUM CHLORIDE 0.9% FLUSH
10.0000 mL | Freq: Once | INTRAVENOUS | Status: AC
  Administered 2022-05-08: 10 mL

## 2022-05-08 MED ORDER — DIPHENHYDRAMINE HCL 50 MG/ML IJ SOLN
50.0000 mg | Freq: Once | INTRAMUSCULAR | Status: AC
  Administered 2022-05-08: 50 mg via INTRAVENOUS
  Filled 2022-05-08: qty 1

## 2022-05-08 MED ORDER — FAMOTIDINE IN NACL 20-0.9 MG/50ML-% IV SOLN
20.0000 mg | Freq: Once | INTRAVENOUS | Status: AC
  Administered 2022-05-08: 20 mg via INTRAVENOUS
  Filled 2022-05-08: qty 50

## 2022-05-08 MED ORDER — SODIUM CHLORIDE 0.9 % IV SOLN: Freq: Once | INTRAVENOUS | Status: AC

## 2022-05-08 MED ORDER — SODIUM CHLORIDE 0.9 % IV SOLN
10.0000 mg | Freq: Once | INTRAVENOUS | Status: AC
  Administered 2022-05-08: 10 mg via INTRAVENOUS
  Filled 2022-05-08: qty 10

## 2022-05-08 MED ORDER — SODIUM CHLORIDE 0.9% FLUSH
10.0000 mL | INTRAVENOUS | Status: DC | PRN
  Administered 2022-05-08: 10 mL

## 2022-05-08 MED ORDER — SODIUM CHLORIDE 0.9 % IV SOLN
80.0000 mg/m2 | Freq: Once | INTRAVENOUS | Status: AC
  Administered 2022-05-08: 108 mg via INTRAVENOUS
  Filled 2022-05-08: qty 18

## 2022-05-08 MED ORDER — HEPARIN SOD (PORK) LOCK FLUSH 100 UNIT/ML IV SOLN
500.0000 [IU] | Freq: Once | INTRAVENOUS | Status: AC | PRN
  Administered 2022-05-08: 500 [IU]

## 2022-05-08 NOTE — Patient Instructions (Signed)
[facility] MEDICAL [facility]COLOG[facility]   Discharge Instructions: Thank you for choosing [facility] to provide your oncology and hematology care.   If you have a lab appointment with the Cancer Center, please go directly to the Cancer Center and check in at the registration area.   Wear comfortable clothing and clothing appropriate for easy access to any Portacath or PICC line.   We strive to give you quality time with your provider. You may need to reschedule your appointment if you arrive late (15 or more minutes).  Arriving late affects you and other patients whose appointments are after yours.  Also, if you miss three or more appointments without notifying the office, you may be dismissed from the clinic at the provider's discretion.      For prescription refill requests, have your pharmacy contact our office and allow 72 hours for refills to be completed.    Today you received the following chemotherapy and/or immunotherapy agents: paclitaxel      To help prevent nausea and vomiting after your treatment, we encourage you to take your nausea medication as directed.  BELOW ARE SYMPTOMS THAT SHOULD BE REPORTED IMMEDIATELY: *FEVER GREATER THAN 100.4 F (38 C) OR HIGHER *CHILLS OR SWEATING *NAUSEA AND VOMITING THAT IS NOT CONTROLLED WITH YOUR NAUSEA MEDICATION *UNUSUAL SHORTNESS OF BREATH *UNUSUAL BRUISING OR BLEEDING *URINARY PROBLEMS (pain or burning when urinating, or frequent urination) *BOWEL PROBLEMS (unusual diarrhea, constipation, pain near the anus) TENDERNESS IN MOUTH AND THROAT WITH OR WITHOUT PRESENCE OF ULCERS (sore throat, sores in mouth, or a toothache) UNUSUAL RASH, SWELLING OR PAIN  UNUSUAL VAGINAL DISCHARGE OR ITCHING   Items with * indicate a potential emergency and should be followed up as soon as possible or go to the Emergency Department if any problems should occur.  Please show the CHEMOTHERAPY ALERT CARD or IMMUNOTHERAPY ALERT CARD at check-in  to the Emergency Department and triage nurse.  Should you have questions after your visit or need to cancel or reschedule your appointment, please contact [facility]  Dept: [phone]  and follow the prompts.  Office hours are 8:00 a.m. to 4:30 p.m. Monday - Friday. Please note that voicemails left after 4:00 p.m. may not be returned until the following business day.  We are closed weekends and major holidays. You have access to a nurse at all times for urgent questions. Please call the main number to the clinic Dept: [phone] and follow the prompts.   For any non-urgent questions, you may also contact your provider using MyChart. We now offer e-Visits for anyone 18 and older to request care online for non-urgent symptoms. For details visit [email or web address].   Also download the MyChart app! Go to the app store, search "MyChart", open the app, select , and log in with your MyChart username and password.  Masks are optional in the cancer centers. If you would like for your care team to wear a mask while they are taking care of you, please let them know. You may have one support person who is at least 70 years old accompany you for your appointments. 

## 2022-05-08 NOTE — Assessment & Plan Note (Signed)
This is likely due to recent treatment. The patient denies recent history of bleeding such as epistaxis, hematuria or hematochezia. She is asymptomatic from the anemia. I will observe for now.   

## 2022-05-08 NOTE — Progress Notes (Signed)
[facility] OFFICE PROGRESS NOTE  Patient Care Team: Default, Provider, MD as PCP - General  ASSESSMENT & PLAN:  Malignant neoplasm of cervix ([place]) So far, she tolerated single agent Taxol without difficulties Her blood count looks stable We will continue modified treatment as scheduled Her last CT imaging show positive response to treatment She will continue her treatment as scheduled I plan to repeat imaging study again next month  Vitamin B12 deficiency We will continue B12 injections monthly I plan to monitor her B12 closely, repeat level is slightly better  Deficiency anemia This is likely due to recent treatment. The patient denies recent history of bleeding such as epistaxis, hematuria or hematochezia. She is asymptomatic from the anemia. I will observe for now.   Hydronephrosis of right kidney She has compromised renal function due to hydronephrosis but overall asymptomatic I do not recommend stent placement long-term  Orders Placed This Encounter  Procedures   CT ABDOMEN PELVIS W CONTRAST    Standing Status:   Future    Standing Expiration Date:   05/09/2023    Order Specific Question:   If indicated for the ordered procedure, I authorize the administration of contrast media per Radiology protocol    Answer:   Yes    Order Specific Question:   Preferred imaging location?    Answer:   [facility]    Order Specific Question:   Radiology Contrast Protocol - do NOT remove file path    Answer:   [email or web address]    All questions were answered. The patient knows to call the clinic with any problems, questions or concerns. The total time spent in the appointment was 30 minutes encounter with patients including review of chart and various tests results, discussions about plan of care and coordination of care plan   [name], MD 05/08/2022 10:20 AM  INTERVAL HISTORY: Please see below for problem oriented  charting. she returns for treatment follow-up on single agent Taxol She denies myalgias, arthralgias or peripheral neuropathy No recent pelvic pain or bleeding  REVIEW OF SYSTEMS:   Constitutional: Denies fevers, chills or abnormal weight loss Eyes: Denies blurriness of vision Ears, nose, mouth, throat, and face: Denies mucositis or sore throat Respiratory: Denies cough, dyspnea or wheezes Cardiovascular: Denies palpitation, chest discomfort or lower extremity swelling Gastrointestinal:  Denies nausea, heartburn or change in bowel habits Skin: Denies abnormal skin rashes Lymphatics: Denies new lymphadenopathy or easy bruising Neurological:Denies numbness, tingling or new weaknesses Behavioral/Psych: Mood is stable, no new changes  All other systems were reviewed with the patient and are negative.  I have reviewed the past medical history, past surgical history, social history and family history with the patient and they are unchanged from previous note.  ALLERGIES:  has No Known Allergies.  MEDICATIONS:  Current Outpatient Medications  Medication Sig Dispense Refill   levothyroxine (SYNTHROID) 100 MCG tablet Take 1 tablet (100 mcg total) by mouth daily before breakfast. 30 tablet 0   lidocaine-prilocaine (EMLA) cream Apply to affected area once 30 g 3   Multiple Vitamin (MULTIVITAMIN) tablet Take 1 tablet by mouth daily.     ondansetron (ZOFRAN) 8 MG tablet Take 1 tablet (8 mg total) by mouth 2 (two) times daily as needed (Nausea or vomiting). 30 tablet 1   prochlorperazine (COMPAZINE) 10 MG tablet Take 1 tablet (10 mg total) by mouth every 6 (six) hours as needed (Nausea or vomiting). 30 tablet 1   No current facility-administered medications for this visit.  Facility-Administered Medications Ordered in Other Visits  Medication Dose Route Frequency Provider Last Rate Last Admin   diphenhydrAMINE (BENADRYL) 50 MG/ML injection            famotidine (PEPCID) 20-0.9 MG/50ML-% IVPB              SUMMARY OF ONCOLOGIC HISTORY: Oncology History Overview Note  Cancer Staging Malignant neoplasm of cervix ([place]) Staging form: Cervix Uteri, AJCC 8th Edition - Clinical: Stage IIIB (cT3b, cN1, cM0) - Signed by [name], MD on 05/04/2018  PD-L1 1% Progressed on carboplatin, pembrolizumab and cisplatin   Malignant neoplasm of cervix ([place])  03/13/2018 Imaging   US pelvis There are mobile echogenic foci within the endometrial cavity suggestive of gas. This is nonspecific in etiology however may be secondary to endometritis. Correlate for history of recent instrumentation. This obscures visualization of the endometrial tissue and therefore a follow-up pelvic ultrasound in 2-4 weeks is recommended after resolution of the acute symptomatology if symptoms persist to further evaluate the endometrium.     03/13/2018 Initial Diagnosis   She initially presented with PMB   04/12/2018 Pathology Results   Cervix, biopsy, mass - INVASIVE SQUAMOUS CELL CARCINOMA - SEE COMMENT   04/12/2018 Surgery   PREOPERATIVE DIAGNOSIS:  Postmenopausal vaginal bleeding. POSTOPERATIVE DIAGNOSIS: The same PROCEDURE: Exam under anesthesia, pap smear, cervical mass biopsy SURGEON:  Dr. [name]   INDICATIONS: 71 y.o. yo [id number] with PMB here for exam under anesthesia.Risks of surgery were discussed with the patient including but not limited to: bleeding which may require transfusion; infection which may require antibiotics; injury to uterus or surrounding organs; need for additional procedures including laparotomy or laparoscopy; and other postoperative/anesthesia complications. Written informed consent was obtained.     FINDINGS:  An 8-week size midline uterus.  No adnexal mass palpable on exam. Normal cervix not visualized. Friable mass seen in involving the vagina at the level of the cervix obliterating the anterior and posterior fornix.   ANESTHESIA:   General INTRAVENOUS FLUIDS:  300 ml of  LR ESTIMATED BLOOD LOSS: 20 ml. SPECIMENS: pap smear, cervical mass biopsy COMPLICATIONS:  None immediate.     04/25/2018 PET scan   1. Large cervical mass may invade into the myometrium in down into the vagina, maximum SUV 21.3. There is a malignant left external iliac node measuring 1.4 cm in short axis with maximum SUV 14.1. 2. Accentuated activity in the cecum and ascending colon is likely physiologic given that it has no CT correlate. Correlation with the patient's colon cancer screening history is recommended. If screening is not up-to-date, appropriate screening should be considered. 3.  Aortic Atherosclerosis (ICD10-I70.0).     04/28/2018 Surgery   Pre-operative Diagnosis:  At least Stage 2B SCCa Cervical Cancer   Post-operative Diagnosis: At least clinical Stage 3A SCCa Cervical Cancer   Operation:  Exam under anesthesia due to intolerance for vaginal exam in office Cystoscopy due to concern for extension to posterior bladder (from anterior vaginal wall lesion)   Operative Findings:   Parametrial involvement on right  Right sided subvaginal tumor burden ~3cm. This approximates the right ureteral insertion into the bladder based on my exam during cystoscopy. Extension of disease down upper half of vagina on lateral and posterior walls. Extension of disease down to lower third of anterior vagina (versus skip lesion) thus making her Stage 3A Cystoscopy revealed patent bilateral ureteral orifices and no bladder mucosa invasion or areas of concern. Rectal exam grossly no disease.     05/04/2018  Cancer Staging   Staging form: Cervix Uteri, AJCC 8th Edition - Clinical: Stage IIIB (cT3b, cN1, cM0) - Signed by [name], MD on 05/04/2018   05/12/2018 Procedure   Successful 8 French right internal jugular vein power port placement with its tip at the SVC/RA junction   05/13/2018 - 06/24/2018 Chemotherapy   The patient had weekly cisplatin given with concurrent radiation     05/17/2018 - 07/27/2018 Radiation Therapy   Radiation treatment dates:   05/17/2018-07/27/2018   Site/dose: 1. Cervix, 1.8 Gy in 25 fractions for a total dose of 45 Gy                    2. Pelvis  Boost, 1.8 Gy in 5 fractions for a total dose of 9 Gy                    3. Cervix, 5.5 Gy in 5 fractions for a total dose of 27.5 Gy   06/03/2018 Adverse Reaction   She missed her chemo due to severe depression and was sent to the ER for suicidal ideation.  She was seen by psychiatrist.   10/27/2018 PET scan   IMPRESSION: 1. Complete metabolic response to therapy, without residual or recurrent hypermetabolic disease. 2.  Aortic Atherosclerosis (ICD10-I70.0).     11/17/2018 Imaging   Successful right IJ vein Port-A-Cath explant.   03/29/2020 PET scan   1. Hypermetabolic lesion along the right vaginal cuff is highly worrisome for recurrent cervical cancer. No evidence of distant metastatic disease. 2.  Aortic atherosclerosis (ICD10-I70.0).   04/26/2020 - 12/10/2020 Chemotherapy   The patient had CARBOplatin  for chemotherapy treatment.      Genetic Testing   Patient has genetic testing done for PD-L1. Results revealed patient has the following:  PD-L1 combined positive score (CPS): 1%   07/08/2020 Imaging   1. Unchanged post treatment appearance of the pelvis, including a soft tissue nodule in the low right hemipelvis adjacent to the vagina measuring 2.8 x 1.7 cm, previously FDG avid and consistent with malignancy. 2. No evidence of nodal or distant metastatic disease in the abdomen or pelvis. 3. Aortic Atherosclerosis (ICD10-I70.0).   10/14/2020 Imaging   1. Unchanged right eccentric soft tissue mass in the low right hemipelvis centered on the cervix and vagina, measuring 2.9 x 1.9 cm. 2. New small volume fluid within the endometrial cavity, abnormal in the postmenopausal setting and suggesting obstruction of the cervical os by soft tissue mass. 3. No evidence of lymphadenopathy or distant  metastatic disease in the abdomen or pelvis.   Aortic Atherosclerosis (ICD10-I70.0).   12/24/2020 Imaging   1. Abnormal hypodense masslike appearance continuous with hypodense masslike lesion probably centrally within a right eccentric cervix (versus right paracervical mass). The endometrial portion has enlarged compared to 10/14/2020 and active malignancy is suspected. 2. No adenopathy or findings of distant spread. 3. Other imaging findings of potential clinical significance: Aortic Atherosclerosis (ICD10-I70.0). Mild cardiomegaly. Stable scarring in the left lower lobe. Sigmoid colon scattered diverticula. Tarlov cysts at the S2 level. Vertical sclerosis in the sacral ala possibly due to stress fractures or prior radiation therapy.     01/06/2021 - 06/19/2021 Chemotherapy   Patient is on Treatment Plan : UTERINE Pembrolizumab q21d      04/07/2021 Imaging   1. The previously hypermetabolic right vaginal mass currently measures 12 cubic cm in volume, previously 11 cubic cm on 12/23/2020. 2. No new adenopathy or new findings of distant metastatic spread. 3.  New wall thickening in several loops of distal ileum suspicious for inflammation/enteritis. 4. Other imaging findings of potential clinical significance: Mild cardiomegaly. Mild sigmoid colon diverticulosis. Stable bony demineralization with vertical sclerosis in the sacral ala likely from old insufficiency fracture.   07/10/2021 Imaging   1. New moderate right-sided hydroureteronephrosis with decreased perfusion to the right kidney compatible with obstructive uropathy.  2. Interval progression of the heterogeneous low-density uterine lesion, with progression of enhancing soft tissue extending inferiorly to the right, compatible with previously described disease in the right vaginal wall. Represents the site of right ureteral obstruction. 3. No evidence for new metastatic disease in the abdomen or pelvis. 4. Bilateral sacral insufficiency  fractures. 5. Trace free fluid in the pelvis. 6. Aortic Atherosclerosis (ICD10-I70.0).     07/22/2021 Procedure   Successful placement of a right internal jugular approach power injectable Port-A-Cath.     07/24/2021 - 10/02/2021 Chemotherapy   Patient is on Treatment Plan : cervical cancer Cisplatin q7d       10/16/2021 Imaging   1. Mild interval increase in size of heterogeneous tumor arising from within the uterus with local tumor extension into the right pelvic sidewall. 2. Persistent right-sided obstructive uropathy with severe right hydronephrosis and progressive right renal cortical volume loss. Right hydroureter extends into the pelvis where it is obstructed by the uterine/cervical mass. 3. Bilateral sacral insufficiency fractures. 4. Aortic Atherosclerosis (ICD10-I70.0).   11/06/2021 -  Chemotherapy   Patient is on Treatment Plan : Cervical Paclitaxel q7d     02/10/2022 Imaging   1. Slight interval decrease in size of a heterogeneous mass within the right pelvis, involving the adjacent right aspect of the uterus and closely abutting the posterior urinary bladder,, consistent with treatment response. Fat stranding and presacral soft tissue thickening in the adjacent pelvis, unchanged. 2. Unchanged severe right hydronephrosis and hydroureter, the distal right ureter obstructed by mass in the pelvis, again with slight progression of right renal cortical atrophy. 3. Unchanged, mildly sclerotic nondisplaced bilateral sacral insufficiency fractures.   Aortic Atherosclerosis (ICD10-I70.0).     PHYSICAL EXAMINATION: ECOG PERFORMANCE STATUS: 1 - Symptomatic but completely ambulatory  Vitals:   05/08/22 1005  BP: (!) 148/76  Pulse: 88  Resp: 18  SpO2: 98%   Filed Weights   05/08/22 1005  Weight: 95 lb 6.4 oz (43.3 kg)    GENERAL:alert, no distress and comfortable NEURO: alert & oriented x 3 with fluent speech, no focal motor/sensory deficits  LABORATORY DATA:  I have  reviewed the data as listed    Component Value Date/Time   NA 141 04/24/2022 0921   K 4.3 04/24/2022 0921   CL 107 04/24/2022 0921   CO2 29 04/24/2022 0921   GLUCOSE 87 04/24/2022 0921   BUN 20 04/24/2022 0921   CREATININE 1.12 (H) 04/24/2022 0921   CALCIUM 8.9 04/24/2022 0921   PROT 6.9 04/24/2022 0921   ALBUMIN 4.2 04/24/2022 0921   AST 13 (L) 04/24/2022 0921   ALT 7 04/24/2022 0921   ALKPHOS 47 04/24/2022 0921   BILITOT 0.3 04/24/2022 0921   GFRNONAA 53 (L) 04/24/2022 0921   GFRAA >60 06/14/2020 0844   GFRAA >60 03/11/2020 0951    No results found for: "SPEP", "UPEP"  Lab Results  Component Value Date   WBC 5.8 05/08/2022   NEUTROABS 3.7 05/08/2022   HGB 10.4 (L) 05/08/2022   HCT 31.8 (L) 05/08/2022   MCV 91.6 05/08/2022   PLT 238 05/08/2022      Chemistry  Component Value Date/Time   NA 141 04/24/2022 0921   K 4.3 04/24/2022 0921   CL 107 04/24/2022 0921   CO2 29 04/24/2022 0921   BUN 20 04/24/2022 0921   CREATININE 1.12 (H) 04/24/2022 0921      Component Value Date/Time   CALCIUM 8.9 04/24/2022 0921   ALKPHOS 47 04/24/2022 0921   AST 13 (L) 04/24/2022 0921   ALT 7 04/24/2022 0921   BILITOT 0.3 04/24/2022 5913

## 2022-05-08 NOTE — Assessment & Plan Note (Signed)
She has compromised renal function due to hydronephrosis but overall asymptomatic I do not recommend stent placement long-term 

## 2022-05-08 NOTE — Assessment & Plan Note (Signed)
So far, she tolerated single agent Taxol without difficulties Her blood count looks stable We will continue modified treatment as scheduled Her last CT imaging show positive response to treatment She will continue her treatment as scheduled I plan to repeat imaging study again next month

## 2022-05-08 NOTE — Assessment & Plan Note (Signed)
We will continue B12 injections monthly I plan to monitor her B12 closely, repeat level is slightly better 

## 2022-05-09 ENCOUNTER — Other Ambulatory Visit: Payer: Self-pay

## 2022-05-10 ENCOUNTER — Other Ambulatory Visit: Payer: Self-pay

## 2022-05-14 MED FILL — Dexamethasone Sodium Phosphate Inj 100 MG/10ML: INTRAMUSCULAR | Qty: 1 | Status: AC

## 2022-05-15 ENCOUNTER — Inpatient Hospital Stay: Payer: Medicare Other

## 2022-05-15 ENCOUNTER — Other Ambulatory Visit: Payer: Self-pay

## 2022-05-15 ENCOUNTER — Inpatient Hospital Stay: Payer: Medicare Other | Attending: Hematology and Oncology

## 2022-05-15 VITALS — BP 145/75 | HR 71 | Resp 17 | Wt 93.2 lb

## 2022-05-15 DIAGNOSIS — N1330 Unspecified hydronephrosis: Secondary | ICD-10-CM | POA: Diagnosis not present

## 2022-05-15 DIAGNOSIS — R45851 Suicidal ideations: Secondary | ICD-10-CM | POA: Diagnosis not present

## 2022-05-15 DIAGNOSIS — Z79890 Hormone replacement therapy (postmenopausal): Secondary | ICD-10-CM | POA: Diagnosis not present

## 2022-05-15 DIAGNOSIS — D61818 Other pancytopenia: Secondary | ICD-10-CM

## 2022-05-15 DIAGNOSIS — C539 Malignant neoplasm of cervix uteri, unspecified: Secondary | ICD-10-CM

## 2022-05-15 DIAGNOSIS — I517 Cardiomegaly: Secondary | ICD-10-CM | POA: Diagnosis not present

## 2022-05-15 DIAGNOSIS — Z5111 Encounter for antineoplastic chemotherapy: Secondary | ICD-10-CM | POA: Diagnosis present

## 2022-05-15 DIAGNOSIS — G96191 Perineural cyst: Secondary | ICD-10-CM | POA: Diagnosis not present

## 2022-05-15 DIAGNOSIS — I700 Atherosclerosis of aorta: Secondary | ICD-10-CM | POA: Diagnosis not present

## 2022-05-15 DIAGNOSIS — R1900 Intra-abdominal and pelvic swelling, mass and lump, unspecified site: Secondary | ICD-10-CM | POA: Insufficient documentation

## 2022-05-15 DIAGNOSIS — Z7189 Other specified counseling: Secondary | ICD-10-CM

## 2022-05-15 DIAGNOSIS — E538 Deficiency of other specified B group vitamins: Secondary | ICD-10-CM | POA: Diagnosis not present

## 2022-05-15 DIAGNOSIS — K5730 Diverticulosis of large intestine without perforation or abscess without bleeding: Secondary | ICD-10-CM | POA: Insufficient documentation

## 2022-05-15 LAB — CBC WITH DIFFERENTIAL (CANCER CENTER ONLY)
Abs Immature Granulocytes: 0.13 10*3/uL — ABNORMAL HIGH (ref 0.00–0.07)
Basophils Absolute: 0.1 10*3/uL (ref 0.0–0.1)
Basophils Relative: 1 %
Eosinophils Absolute: 0.1 10*3/uL (ref 0.0–0.5)
Eosinophils Relative: 2 %
HCT: 30.9 % — ABNORMAL LOW (ref 36.0–46.0)
Hemoglobin: 10.3 g/dL — ABNORMAL LOW (ref 12.0–15.0)
Immature Granulocytes: 2 %
Lymphocytes Relative: 16 %
Lymphs Abs: 1 10*3/uL (ref 0.7–4.0)
MCH: 30.7 pg (ref 26.0–34.0)
MCHC: 33.3 g/dL (ref 30.0–36.0)
MCV: 92 fL (ref 80.0–100.0)
Monocytes Absolute: 0.6 10*3/uL (ref 0.1–1.0)
Monocytes Relative: 10 %
Neutro Abs: 4.1 10*3/uL (ref 1.7–7.7)
Neutrophils Relative %: 69 %
Platelet Count: 223 10*3/uL (ref 150–400)
RBC: 3.36 MIL/uL — ABNORMAL LOW (ref 3.87–5.11)
RDW: 14.7 % (ref 11.5–15.5)
WBC Count: 6 10*3/uL (ref 4.0–10.5)
nRBC: 0 % (ref 0.0–0.2)

## 2022-05-15 LAB — CMP (CANCER CENTER ONLY)
ALT: 9 U/L (ref 0–44)
AST: 15 U/L (ref 15–41)
Albumin: 4.3 g/dL (ref 3.5–5.0)
Alkaline Phosphatase: 53 U/L (ref 38–126)
Anion gap: 5 (ref 5–15)
BUN: 26 mg/dL — ABNORMAL HIGH (ref 8–23)
CO2: 26 mmol/L (ref 22–32)
Calcium: 9.1 mg/dL (ref 8.9–10.3)
Chloride: 108 mmol/L (ref 98–111)
Creatinine: 1.05 mg/dL — ABNORMAL HIGH (ref 0.44–1.00)
GFR, Estimated: 58 mL/min — ABNORMAL LOW (ref >60)
Glucose, Bld: 100 mg/dL — ABNORMAL HIGH (ref 70–99)
Potassium: 4 mmol/L (ref 3.5–5.1)
Sodium: 139 mmol/L (ref 135–145)
Total Bilirubin: 0.3 mg/dL (ref 0.3–1.2)
Total Protein: 7.2 g/dL (ref 6.5–8.1)

## 2022-05-15 MED ORDER — SODIUM CHLORIDE 0.9 % IV SOLN
80.0000 mg/m2 | Freq: Once | INTRAVENOUS | Status: AC
  Administered 2022-05-15: 108 mg via INTRAVENOUS
  Filled 2022-05-15: qty 18

## 2022-05-15 MED ORDER — SODIUM CHLORIDE 0.9 % IV SOLN
10.0000 mg | Freq: Once | INTRAVENOUS | Status: AC
  Administered 2022-05-15: 10 mg via INTRAVENOUS
  Filled 2022-05-15: qty 10

## 2022-05-15 MED ORDER — DIPHENHYDRAMINE HCL 50 MG/ML IJ SOLN
50.0000 mg | Freq: Once | INTRAMUSCULAR | Status: AC
  Administered 2022-05-15: 50 mg via INTRAVENOUS
  Filled 2022-05-15: qty 1

## 2022-05-15 MED ORDER — FAMOTIDINE IN NACL 20-0.9 MG/50ML-% IV SOLN
20.0000 mg | Freq: Once | INTRAVENOUS | Status: AC
  Administered 2022-05-15: 20 mg via INTRAVENOUS
  Filled 2022-05-15: qty 50

## 2022-05-15 MED ORDER — SODIUM CHLORIDE 0.9 % IV SOLN: Freq: Once | INTRAVENOUS | Status: AC

## 2022-05-15 MED ORDER — SODIUM CHLORIDE 0.9% FLUSH
10.0000 mL | Freq: Once | INTRAVENOUS | Status: AC
  Administered 2022-05-15: 10 mL

## 2022-05-15 NOTE — Patient Instructions (Signed)
[facility]COL[facility]   Discharge Instructions: Thank you for choosing [facility] to provide your oncology and hematology care.   If you have a lab appointment with the Cancer Center, please go directly to the Cancer Center and check in at the registration area.   Wear comfortable clothing and clothing appropriate for easy access to any Portacath or PICC line.   We strive to give you quality time with your provider. You may need to reschedule your appointment if you arrive late (15 or more minutes).  Arriving late affects you and other patients whose appointments are after yours.  Also, if you miss three or more appointments without notifying the office, you may be dismissed from the clinic at the provider's discretion.      For prescription refill requests, have your pharmacy contact our office and allow 72 hours for refills to be completed.    Today you received the following chemotherapy and/or immunotherapy agents: paclitaxel      To help prevent nausea and vomiting after your treatment, we encourage you to take your nausea medication as directed.  BELOW ARE SYMPTOMS THAT SHOULD BE REPORTED IMMEDIATELY: *FEVER GREATER THAN 100.4 F (38 C) OR HIGHER *CHILLS OR SWEATING *NAUSEA AND VOMITING THAT IS NOT CONTROLLED WITH YOUR NAUSEA MEDICATION *UNUSUAL SHORTNESS OF BREATH *UNUSUAL BRUISING OR BLEEDING *URINARY PROBLEMS (pain or burning when urinating, or frequent urination) *BOWEL PROBLEMS (unusual diarrhea, constipation, pain near the anus) TENDERNESS IN MOUTH AND THROAT WITH OR WITHOUT PRESENCE OF ULCERS (sore throat, sores in mouth, or a toothache) UNUSUAL RASH, SWELLING OR PAIN  UNUSUAL VAGINAL DISCHARGE OR ITCHING   Items with * indicate a potential emergency and should be followed up as soon as possible or go to the Emergency Department if any problems should occur.  Please show the CHEMOTHERAPY ALERT CARD or IMMUNOTHERAPY ALERT CARD at check-in  to the Emergency Department and triage nurse.  Should you have questions after your visit or need to cancel or reschedule your appointment, please contact [facility]  Dept: [phone]  and follow the prompts.  Office hours are 8:00 a.m. to 4:30 p.m. Monday - Friday. Please note that voicemails left after 4:00 p.m. may not be returned until the following business day.  We are closed weekends and major holidays. You have access to a nurse at all times for urgent questions. Please call the main number to the clinic Dept: [phone] and follow the prompts.   For any non-urgent questions, you may also contact your provider using MyChart. We now offer e-Visits for anyone 18 and older to request care online for non-urgent symptoms. For details visit [email or web address].   Also download the MyChart app! Go to the app store, search "MyChart", open the app, select , and log in with your MyChart username and password.  Masks are optional in the cancer centers. If you would like for your care team to wear a mask while they are taking care of you, please let them know. You may have one support person who is at least 70 years old accompany you for your appointments. 

## 2022-05-19 ENCOUNTER — Other Ambulatory Visit: Payer: Self-pay | Admitting: Hematology and Oncology

## 2022-05-19 DIAGNOSIS — C539 Malignant neoplasm of cervix uteri, unspecified: Secondary | ICD-10-CM

## 2022-05-19 DIAGNOSIS — Z7189 Other specified counseling: Secondary | ICD-10-CM

## 2022-05-19 NOTE — Progress Notes (Signed)
OFF PATHWAY REGIMEN - Other  No Change  Continue With Treatment as Ordered.  Original Decision Date/Time: 10/30/2021 08:21   OFF00010:Paclitaxel 80 mg/m2 Weekly:   Administer weekly:     Paclitaxel   **Always confirm dose/schedule in your pharmacy ordering system**  Patient Characteristics: Intent of Therapy: Non-Curative / Palliative Intent, Discussed with Patient

## 2022-05-20 ENCOUNTER — Other Ambulatory Visit: Payer: Self-pay

## 2022-05-20 ENCOUNTER — Other Ambulatory Visit: Payer: Self-pay | Admitting: Hematology and Oncology

## 2022-05-21 ENCOUNTER — Other Ambulatory Visit: Payer: Self-pay

## 2022-05-27 ENCOUNTER — Encounter (HOSPITAL_COMMUNITY): Payer: Self-pay

## 2022-05-27 ENCOUNTER — Ambulatory Visit (HOSPITAL_COMMUNITY)
Admission: RE | Admit: 2022-05-27 | Discharge: 2022-05-27 | Disposition: A | Discharge status: Home / Self Care | Payer: Medicare Other | Source: Ambulatory Visit | Attending: Hematology and Oncology | Admitting: Hematology and Oncology

## 2022-05-27 ENCOUNTER — Inpatient Hospital Stay: Payer: Medicare Other

## 2022-05-27 ENCOUNTER — Other Ambulatory Visit: Payer: Self-pay

## 2022-05-27 DIAGNOSIS — C539 Malignant neoplasm of cervix uteri, unspecified: Secondary | ICD-10-CM | POA: Insufficient documentation

## 2022-05-27 DIAGNOSIS — D61818 Other pancytopenia: Secondary | ICD-10-CM

## 2022-05-27 DIAGNOSIS — Z7189 Other specified counseling: Secondary | ICD-10-CM

## 2022-05-27 DIAGNOSIS — E538 Deficiency of other specified B group vitamins: Secondary | ICD-10-CM

## 2022-05-27 LAB — CBC WITH DIFFERENTIAL (CANCER CENTER ONLY)
Abs Immature Granulocytes: 0.05 10*3/uL (ref 0.00–0.07)
Basophils Absolute: 0 10*3/uL (ref 0.0–0.1)
Basophils Relative: 1 %
Eosinophils Absolute: 0.1 10*3/uL (ref 0.0–0.5)
Eosinophils Relative: 2 %
HCT: 31.5 % — ABNORMAL LOW (ref 36.0–46.0)
Hemoglobin: 10.3 g/dL — ABNORMAL LOW (ref 12.0–15.0)
Immature Granulocytes: 1 %
Lymphocytes Relative: 15 %
Lymphs Abs: 0.8 10*3/uL (ref 0.7–4.0)
MCH: 30.3 pg (ref 26.0–34.0)
MCHC: 32.7 g/dL (ref 30.0–36.0)
MCV: 92.6 fL (ref 80.0–100.0)
Monocytes Absolute: 0.9 10*3/uL (ref 0.1–1.0)
Monocytes Relative: 18 %
Neutro Abs: 3.2 10*3/uL (ref 1.7–7.7)
Neutrophils Relative %: 63 %
Platelet Count: 229 10*3/uL (ref 150–400)
RBC: 3.4 MIL/uL — ABNORMAL LOW (ref 3.87–5.11)
RDW: 14.9 % (ref 11.5–15.5)
WBC Count: 5.1 10*3/uL (ref 4.0–10.5)
nRBC: 0 % (ref 0.0–0.2)

## 2022-05-27 LAB — CMP (CANCER CENTER ONLY)
ALT: 7 U/L (ref 0–44)
AST: 16 U/L (ref 15–41)
Albumin: 4.1 g/dL (ref 3.5–5.0)
Alkaline Phosphatase: 44 U/L (ref 38–126)
Anion gap: 5 (ref 5–15)
BUN: 22 mg/dL (ref 8–23)
CO2: 27 mmol/L (ref 22–32)
Calcium: 9.2 mg/dL (ref 8.9–10.3)
Chloride: 107 mmol/L (ref 98–111)
Creatinine: 1.01 mg/dL — ABNORMAL HIGH (ref 0.44–1.00)
GFR, Estimated: >60 mL/min (ref >60)
Glucose, Bld: 104 mg/dL — ABNORMAL HIGH (ref 70–99)
Potassium: 4 mmol/L (ref 3.5–5.1)
Sodium: 139 mmol/L (ref 135–145)
Total Bilirubin: 0.3 mg/dL (ref 0.3–1.2)
Total Protein: 6.6 g/dL (ref 6.5–8.1)

## 2022-05-27 MED ORDER — SODIUM CHLORIDE (PF) 0.9 % IJ SOLN
INTRAMUSCULAR | Status: AC
  Filled 2022-05-27: qty 50

## 2022-05-27 MED ORDER — CYANOCOBALAMIN 1000 MCG/ML IJ SOLN: 1000.0000 ug | Freq: Once | INTRAMUSCULAR | Status: DC

## 2022-05-27 MED ORDER — IOHEXOL 300 MG/ML  SOLN
100.0000 mL | Freq: Once | INTRAMUSCULAR | Status: AC | PRN
  Administered 2022-05-27: 75 mL via INTRAVENOUS

## 2022-05-27 MED ORDER — SODIUM CHLORIDE 0.9% FLUSH
10.0000 mL | Freq: Once | INTRAVENOUS | Status: AC
  Administered 2022-05-27: 10 mL

## 2022-05-27 MED ORDER — HEPARIN SOD (PORK) LOCK FLUSH 100 UNIT/ML IV SOLN
INTRAVENOUS | Status: AC
  Administered 2022-05-27: 500 [IU] via INTRAVENOUS
  Filled 2022-05-27: qty 5

## 2022-05-27 MED ORDER — HEPARIN SOD (PORK) LOCK FLUSH 100 UNIT/ML IV SOLN: 500.0000 [IU] | Freq: Once | INTRAVENOUS | Status: AC

## 2022-05-28 ENCOUNTER — Other Ambulatory Visit: Payer: Self-pay

## 2022-05-28 MED FILL — Dexamethasone Sodium Phosphate Inj 100 MG/10ML: INTRAMUSCULAR | Qty: 1 | Status: AC

## 2022-05-29 ENCOUNTER — Encounter: Payer: Self-pay | Admitting: Hematology and Oncology

## 2022-05-29 ENCOUNTER — Other Ambulatory Visit: Payer: Self-pay

## 2022-05-29 ENCOUNTER — Other Ambulatory Visit: Payer: Self-pay | Admitting: Hematology and Oncology

## 2022-05-29 ENCOUNTER — Inpatient Hospital Stay: Payer: Medicare Other

## 2022-05-29 ENCOUNTER — Inpatient Hospital Stay (HOSPITAL_BASED_OUTPATIENT_CLINIC_OR_DEPARTMENT_OTHER): Payer: Medicare Other | Admitting: Hematology and Oncology

## 2022-05-29 VITALS — BP 124/79 | HR 72 | Temp 99.0°F | Resp 18 | Ht 62.0 in | Wt 94.0 lb

## 2022-05-29 DIAGNOSIS — Z7189 Other specified counseling: Secondary | ICD-10-CM | POA: Diagnosis not present

## 2022-05-29 DIAGNOSIS — N1330 Unspecified hydronephrosis: Secondary | ICD-10-CM | POA: Diagnosis not present

## 2022-05-29 DIAGNOSIS — C539 Malignant neoplasm of cervix uteri, unspecified: Secondary | ICD-10-CM | POA: Diagnosis not present

## 2022-05-29 DIAGNOSIS — Z5111 Encounter for antineoplastic chemotherapy: Secondary | ICD-10-CM | POA: Diagnosis not present

## 2022-05-29 MED ORDER — LEVOTHYROXINE SODIUM 100 MCG PO TABS: 100.0000 ug | ORAL_TABLET | Freq: Every day | ORAL | 0 refills | Status: DC

## 2022-05-29 NOTE — Progress Notes (Signed)
[place] OFFICE PROGRESS NOTE  Patient Care Team: Default, Provider, MD as PCP - General  ASSESSMENT & PLAN:  Malignant neoplasm of cervix ([place]) I have reviewed multiple imaging studies with the patient Unfortunately, she has disease progression The patient has progressed on 4 different lines of treatment including carboplatin, paclitaxel, pembrolizumab as well as cisplatin She is interested for further treatment I reviewed the guidelines with her Any treatment beyond for lines of chemotherapy has minimal benefit in less than 20%, at the expense of complications and reduction in quality of life If she is interested, we have single agent gemcitabine and etoposide available The risk, benefits, side effects of each option were fully discussed and she is undecided She was discussed with her family member later this weekend and will call us back for her final decision next week  Hydronephrosis of right kidney She has compromised renal function due to hydronephrosis but overall asymptomatic I do not recommend stent placement long-term  Goals of care, counseling/discussion We discussed goals of care She is aware of low response rates We discussed the role of palliative treatment  No orders of the defined types were placed in this encounter.   All questions were answered. The patient knows to call the clinic with any problems, questions or concerns. The total time spent in the appointment was 40 minutes encounter with patients including review of chart and various tests results, discussions about plan of care and coordination of care plan   [name], MD 05/29/2022 11:59 AM  INTERVAL HISTORY: Please see below for problem oriented charting. she returns for treatment follow-up and review of imaging study results She denies pelvic pain, vaginal bleeding or discomfort  REVIEW OF SYSTEMS:   Constitutional: Denies fevers, chills or abnormal weight loss Eyes: Denies  blurriness of vision Ears, nose, mouth, throat, and face: Denies mucositis or sore throat Respiratory: Denies cough, dyspnea or wheezes Cardiovascular: Denies palpitation, chest discomfort or lower extremity swelling Gastrointestinal:  Denies nausea, heartburn or change in bowel habits Skin: Denies abnormal skin rashes Lymphatics: Denies new lymphadenopathy or easy bruising Neurological:Denies numbness, tingling or new weaknesses Behavioral/Psych: Mood is stable, no new changes  All other systems were reviewed with the patient and are negative.  I have reviewed the past medical history, past surgical history, social history and family history with the patient and they are unchanged from previous note.  ALLERGIES:  has No Known Allergies.  MEDICATIONS:  Current Outpatient Medications  Medication Sig Dispense Refill   levothyroxine (SYNTHROID) 100 MCG tablet Take 1 tablet (100 mcg total) by mouth daily before breakfast. 30 tablet 0   lidocaine-prilocaine (EMLA) cream Apply to affected area once 30 g 3   Multiple Vitamin (MULTIVITAMIN) tablet Take 1 tablet by mouth daily.     ondansetron (ZOFRAN) 8 MG tablet Take 1 tablet (8 mg total) by mouth 2 (two) times daily as needed (Nausea or vomiting). 30 tablet 1   prochlorperazine (COMPAZINE) 10 MG tablet Take 1 tablet (10 mg total) by mouth every 6 (six) hours as needed (Nausea or vomiting). 30 tablet 1   No current facility-administered medications for this visit.   Facility-Administered Medications Ordered in Other Visits  Medication Dose Route Frequency Provider Last Rate Last Admin   diphenhydrAMINE (BENADRYL) 50 MG/ML injection            famotidine (PEPCID) 20-0.9 MG/50ML-% IVPB             SUMMARY OF ONCOLOGIC HISTORY: Oncology History Overview Note  Cancer Staging Malignant neoplasm of cervix ([place]) Staging form: Cervix Uteri, AJCC 8th Edition - Clinical: Stage IIIB (cT3b, cN1, cM0) - Signed by [name], MD on  05/04/2018  PD-L1 1% Progressed on carboplatin, pembrolizumab, cisplatin and taxol   Malignant neoplasm of cervix ([place])  03/13/2018 Imaging   US pelvis There are mobile echogenic foci within the endometrial cavity suggestive of gas. This is nonspecific in etiology however may be secondary to endometritis. Correlate for history of recent instrumentation. This obscures visualization of the endometrial tissue and therefore a follow-up pelvic ultrasound in 2-4 weeks is recommended after resolution of the acute symptomatology if symptoms persist to further evaluate the endometrium.     03/13/2018 Initial Diagnosis   She initially presented with PMB   04/12/2018 Pathology Results   Cervix, biopsy, mass - INVASIVE SQUAMOUS CELL CARCINOMA - SEE COMMENT   04/12/2018 Surgery   PREOPERATIVE DIAGNOSIS:  Postmenopausal vaginal bleeding. POSTOPERATIVE DIAGNOSIS: The same PROCEDURE: Exam under anesthesia, pap smear, cervical mass biopsy SURGEON:  Dr. [name]   INDICATIONS: 71 y.o. yo [id number] with PMB here for exam under anesthesia.Risks of surgery were discussed with the patient including but not limited to: bleeding which may require transfusion; infection which may require antibiotics; injury to uterus or surrounding organs; need for additional procedures including laparotomy or laparoscopy; and other postoperative/anesthesia complications. Written informed consent was obtained.     FINDINGS:  An 8-week size midline uterus.  No adnexal mass palpable on exam. Normal cervix not visualized. Friable mass seen in involving the vagina at the level of the cervix obliterating the anterior and posterior fornix.   ANESTHESIA:   General INTRAVENOUS FLUIDS:  300 ml of LR ESTIMATED BLOOD LOSS: 20 ml. SPECIMENS: pap smear, cervical mass biopsy COMPLICATIONS:  None immediate.     04/25/2018 PET scan   1. Large cervical mass may invade into the myometrium in down into the vagina, maximum SUV 21.3. There is  a malignant left external iliac node measuring 1.4 cm in short axis with maximum SUV 14.1. 2. Accentuated activity in the cecum and ascending colon is likely physiologic given that it has no CT correlate. Correlation with the patient's colon cancer screening history is recommended. If screening is not up-to-date, appropriate screening should be considered. 3.  Aortic Atherosclerosis (ICD10-I70.0).     04/28/2018 Surgery   Pre-operative Diagnosis:  At least Stage 2B SCCa Cervical Cancer   Post-operative Diagnosis: At least clinical Stage 3A SCCa Cervical Cancer   Operation:  Exam under anesthesia due to intolerance for vaginal exam in office Cystoscopy due to concern for extension to posterior bladder (from anterior vaginal wall lesion)   Operative Findings:   Parametrial involvement on right  Right sided subvaginal tumor burden ~3cm. This approximates the right ureteral insertion into the bladder based on my exam during cystoscopy. Extension of disease down upper half of vagina on lateral and posterior walls. Extension of disease down to lower third of anterior vagina (versus skip lesion) thus making her Stage 3A Cystoscopy revealed patent bilateral ureteral orifices and no bladder mucosa invasion or areas of concern. Rectal exam grossly no disease.     05/04/2018 Cancer Staging   Staging form: Cervix Uteri, AJCC 8th Edition - Clinical: Stage IIIB (cT3b, cN1, cM0) - Signed by [name], MD on 05/04/2018   05/12/2018 Procedure   Successful 8 French right internal jugular vein power port placement with its tip at the SVC/RA junction   05/13/2018 - 06/24/2018 Chemotherapy   The patient  had weekly cisplatin given with concurrent radiation    05/17/2018 - 07/27/2018 Radiation Therapy   Radiation treatment dates:   05/17/2018-07/27/2018   Site/dose: 1. Cervix, 1.8 Gy in 25 fractions for a total dose of 45 Gy                    2. Pelvis  Boost, 1.8 Gy in 5 fractions for a total dose of 9  Gy                    3. Cervix, 5.5 Gy in 5 fractions for a total dose of 27.5 Gy   06/03/2018 Adverse Reaction   She missed her chemo due to severe depression and was sent to the ER for suicidal ideation.  She was seen by psychiatrist.   10/27/2018 PET scan   IMPRESSION: 1. Complete metabolic response to therapy, without residual or recurrent hypermetabolic disease. 2.  Aortic Atherosclerosis (ICD10-I70.0).     11/17/2018 Imaging   Successful right IJ vein Port-A-Cath explant.   03/29/2020 PET scan   1. Hypermetabolic lesion along the right vaginal cuff is highly worrisome for recurrent cervical cancer. No evidence of distant metastatic disease. 2.  Aortic atherosclerosis (ICD10-I70.0).   04/26/2020 - 12/10/2020 Chemotherapy   The patient had CARBOplatin  for chemotherapy treatment.      Genetic Testing   Patient has genetic testing done for PD-L1. Results revealed patient has the following:  PD-L1 combined positive score (CPS): 1%   07/08/2020 Imaging   1. Unchanged post treatment appearance of the pelvis, including a soft tissue nodule in the low right hemipelvis adjacent to the vagina measuring 2.8 x 1.7 cm, previously FDG avid and consistent with malignancy. 2. No evidence of nodal or distant metastatic disease in the abdomen or pelvis. 3. Aortic Atherosclerosis (ICD10-I70.0).   10/14/2020 Imaging   1. Unchanged right eccentric soft tissue mass in the low right hemipelvis centered on the cervix and vagina, measuring 2.9 x 1.9 cm. 2. New small volume fluid within the endometrial cavity, abnormal in the postmenopausal setting and suggesting obstruction of the cervical os by soft tissue mass. 3. No evidence of lymphadenopathy or distant metastatic disease in the abdomen or pelvis.   Aortic Atherosclerosis (ICD10-I70.0).   12/24/2020 Imaging   1. Abnormal hypodense masslike appearance continuous with hypodense masslike lesion probably centrally within a right eccentric cervix  (versus right paracervical mass). The endometrial portion has enlarged compared to 10/14/2020 and active malignancy is suspected. 2. No adenopathy or findings of distant spread. 3. Other imaging findings of potential clinical significance: Aortic Atherosclerosis (ICD10-I70.0). Mild cardiomegaly. Stable scarring in the left lower lobe. Sigmoid colon scattered diverticula. Tarlov cysts at the S2 level. Vertical sclerosis in the sacral ala possibly due to stress fractures or prior radiation therapy.     01/06/2021 - 06/19/2021 Chemotherapy   Patient is on Treatment Plan : UTERINE Pembrolizumab q21d      04/07/2021 Imaging   1. The previously hypermetabolic right vaginal mass currently measures 12 cubic cm in volume, previously 11 cubic cm on 12/23/2020. 2. No new adenopathy or new findings of distant metastatic spread. 3. New wall thickening in several loops of distal ileum suspicious for inflammation/enteritis. 4. Other imaging findings of potential clinical significance: Mild cardiomegaly. Mild sigmoid colon diverticulosis. Stable bony demineralization with vertical sclerosis in the sacral ala likely from old insufficiency fracture.   07/10/2021 Imaging   1. New moderate right-sided hydroureteronephrosis with decreased perfusion to the right kidney  compatible with obstructive uropathy.  2. Interval progression of the heterogeneous low-density uterine lesion, with progression of enhancing soft tissue extending inferiorly to the right, compatible with previously described disease in the right vaginal wall. Represents the site of right ureteral obstruction. 3. No evidence for new metastatic disease in the abdomen or pelvis. 4. Bilateral sacral insufficiency fractures. 5. Trace free fluid in the pelvis. 6. Aortic Atherosclerosis (ICD10-I70.0).     07/22/2021 Procedure   Successful placement of a right internal jugular approach power injectable Port-A-Cath.     07/24/2021 - 10/02/2021 Chemotherapy    Patient is on Treatment Plan : cervical cancer Cisplatin q7d       10/16/2021 Imaging   1. Mild interval increase in size of heterogeneous tumor arising from within the uterus with local tumor extension into the right pelvic sidewall. 2. Persistent right-sided obstructive uropathy with severe right hydronephrosis and progressive right renal cortical volume loss. Right hydroureter extends into the pelvis where it is obstructed by the uterine/cervical mass. 3. Bilateral sacral insufficiency fractures. 4. Aortic Atherosclerosis (ICD10-I70.0).   11/06/2021 - 05/15/2022 Chemotherapy   Patient is on Treatment Plan : Cervical Paclitaxel q7d     02/10/2022 Imaging   1. Slight interval decrease in size of a heterogeneous mass within the right pelvis, involving the adjacent right aspect of the uterus and closely abutting the posterior urinary bladder,, consistent with treatment response. Fat stranding and presacral soft tissue thickening in the adjacent pelvis, unchanged. 2. Unchanged severe right hydronephrosis and hydroureter, the distal right ureter obstructed by mass in the pelvis, again with slight progression of right renal cortical atrophy. 3. Unchanged, mildly sclerotic nondisplaced bilateral sacral insufficiency fractures.   Aortic Atherosclerosis (ICD10-I70.0).   05/28/2022 Imaging   1. Slight interval increase in size of the lower uterine/cervical mass. 2. Persistent severe right-sided hydroureteronephrosis down to the level of the uterine/cervical mass. 3. Stable right-sided posterior bladder wall thickening. 4. No findings for metastatic disease involving the abdomen/pelvis.   Aortic Atherosclerosis (ICD10-I70.0).   05/29/2022 - 05/29/2022 Chemotherapy   Patient is on Treatment Plan : OVARIAN Paclitaxel (80) D1,8,15,22 q28d       PHYSICAL EXAMINATION: ECOG PERFORMANCE STATUS: 0 - Asymptomatic  Vitals:   05/29/22 0958  BP: 124/79  Pulse: 72  Resp: 18  Temp: 99 F (37.2 C)  SpO2:  99%   Filed Weights   05/29/22 0958  Weight: 94 lb (42.6 kg)    GENERAL:alert, no distress and comfortable NEURO: alert & oriented x 3 with fluent speech, no focal motor/sensory deficits  LABORATORY DATA:  I have reviewed the data as listed    Component Value Date/Time   NA 139 05/27/2022 0939   K 4.0 05/27/2022 0939   CL 107 05/27/2022 0939   CO2 27 05/27/2022 0939   GLUCOSE 104 (H) 05/27/2022 0939   BUN 22 05/27/2022 0939   CREATININE 1.01 (H) 05/27/2022 0939   CALCIUM 9.2 05/27/2022 0939   PROT 6.6 05/27/2022 0939   ALBUMIN 4.1 05/27/2022 0939   AST 16 05/27/2022 0939   ALT 7 05/27/2022 0939   ALKPHOS 44 05/27/2022 0939   BILITOT 0.3 05/27/2022 0939   GFRNONAA >60 05/27/2022 0939   GFRAA >60 06/14/2020 0844   GFRAA >60 03/11/2020 0951    No results found for: "SPEP", "UPEP"  Lab Results  Component Value Date   WBC 5.1 05/27/2022   NEUTROABS 3.2 05/27/2022   HGB 10.3 (L) 05/27/2022   HCT 31.5 (L) 05/27/2022   MCV 92.6 05/27/2022  PLT 229 05/27/2022      Chemistry      Component Value Date/Time   NA 139 05/27/2022 0939   K 4.0 05/27/2022 0939   CL 107 05/27/2022 0939   CO2 27 05/27/2022 0939   BUN 22 05/27/2022 0939   CREATININE 1.01 (H) 05/27/2022 0939      Component Value Date/Time   CALCIUM 9.2 05/27/2022 0939   ALKPHOS 44 05/27/2022 0939   AST 16 05/27/2022 0939   ALT 7 05/27/2022 0939   BILITOT 0.3 05/27/2022 0939       RADIOGRAPHIC STUDIES: I have reviewed multiple imaging studies with the patient I have personally reviewed the radiological images as listed and agreed with the findings in the report. CT ABDOMEN PELVIS W CONTRAST  Result Date: 05/28/2022 CLINICAL DATA:  History of cervical cancer. Assess treatment response. * Tracking Code: BO * EXAM: CT ABDOMEN AND PELVIS WITH CONTRAST TECHNIQUE: Multidetector CT imaging of the abdomen and pelvis was performed using the standard protocol following bolus administration of intravenous  contrast. RADIATION DOSE REDUCTION: This exam was performed according to the departmental dose-optimization program which includes automated exposure control, adjustment of the mA and/or kV according to patient size and/or use of iterative reconstruction technique. CONTRAST:  73m OMNIPAQUE IOHEXOL 300 MG/ML  SOLN COMPARISON:  Numerous prior imaging studies. The most recent CT scan is 02/05/2022 FINDINGS: Lower chest: Stable basilar scarring changes. No pulmonary lesions or pleural effusions. The heart is normal in size. No pericardial effusion. Hepatobiliary: No hepatic lesions or intrahepatic biliary dilatation. The gallbladder is unremarkable. No common bile duct dilatation. Pancreas: No mass, inflammation or ductal dilatation. Spleen: Normal size.  No focal lesions. Adrenals/Urinary Tract: Adrenal glands are normal. Persistent severe right-sided hydroureteronephrosis down to the level of the uterine/cervical mass. No left-sided hydronephrosis. No worrisome renal lesions. Mild right posterior bladder wall thickening. Could not exclude tumor involvement. Stomach/Bowel: The stomach, duodenum, small bowel and colon are unremarkable. Vascular/Lymphatic: Stable scattered aortic calcifications but no aneurysm or dissection. The major venous structures are patent. No mesenteric or retroperitoneal mass or lymphadenopathy. No pelvic lymphadenopathy or inguinal lymphadenopathy the. Reproductive: The lower uterine/cervical mass measures 6.8 x 5.2 x 3.5 cm and previously measured 6.2 x 4.5 x 2.7 cm. Other: Small amount of free pelvic fluid but no overt ascites. No omental or peritoneal surface lesions are identified. Musculoskeletal: No significant bony findings. Stable sacral Tarlov cysts. Healed sacral1 insufficiency fractures. IMPRESSION: 1. Slight interval increase in size of the lower uterine/cervical mass. 2. Persistent severe right-sided hydroureteronephrosis down to the level of the uterine/cervical mass. 3. Stable  right-sided posterior bladder wall thickening. 4. No findings for metastatic disease involving the abdomen/pelvis. Aortic Atherosclerosis (ICD10-I70.0). Electronically Signed   By: PMarijo SanesM.D.   On: 05/28/2022 11:21

## 2022-05-29 NOTE — Assessment & Plan Note (Signed)
We discussed goals of care She is aware of low response rates We discussed the role of palliative treatment 

## 2022-05-29 NOTE — Assessment & Plan Note (Signed)
She has compromised renal function due to hydronephrosis but overall asymptomatic I do not recommend stent placement long-term 

## 2022-05-29 NOTE — Assessment & Plan Note (Signed)
I have reviewed multiple imaging studies with the patient Unfortunately, she has disease progression The patient has progressed on 4 different lines of treatment including carboplatin, paclitaxel, pembrolizumab as well as cisplatin She is interested for further treatment I reviewed the guidelines with her Any treatment beyond for lines of chemotherapy has minimal benefit in less than 20%, at the expense of complications and reduction in quality of life If she is interested, we have single agent gemcitabine and etoposide available The risk, benefits, side effects of each option were fully discussed and she is undecided She was discussed with her family member later this weekend and will call us back for her final decision next week

## 2022-06-02 ENCOUNTER — Telehealth: Payer: Self-pay

## 2022-06-02 NOTE — Telephone Encounter (Signed)
Called and scheduled appt for 9/22 at 2 pm. She is aware of appt.

## 2022-06-05 ENCOUNTER — Inpatient Hospital Stay: Payer: Medicare Other

## 2022-06-05 ENCOUNTER — Inpatient Hospital Stay (HOSPITAL_BASED_OUTPATIENT_CLINIC_OR_DEPARTMENT_OTHER): Payer: Medicare Other | Admitting: Hematology and Oncology

## 2022-06-05 ENCOUNTER — Encounter: Payer: Self-pay | Admitting: Hematology and Oncology

## 2022-06-05 ENCOUNTER — Other Ambulatory Visit: Payer: Self-pay

## 2022-06-05 VITALS — BP 151/79 | HR 77 | Temp 99.7°F | Resp 16 | Ht 62.0 in | Wt 92.4 lb

## 2022-06-05 DIAGNOSIS — Z5111 Encounter for antineoplastic chemotherapy: Secondary | ICD-10-CM | POA: Diagnosis not present

## 2022-06-05 DIAGNOSIS — Z7189 Other specified counseling: Secondary | ICD-10-CM

## 2022-06-05 DIAGNOSIS — E538 Deficiency of other specified B group vitamins: Secondary | ICD-10-CM

## 2022-06-05 DIAGNOSIS — C539 Malignant neoplasm of cervix uteri, unspecified: Secondary | ICD-10-CM | POA: Diagnosis not present

## 2022-06-05 MED ORDER — LIDOCAINE-PRILOCAINE 2.5-2.5 % EX CREA: TOPICAL_CREAM | CUTANEOUS | 3 refills | Status: AC

## 2022-06-05 NOTE — Assessment & Plan Note (Signed)
We will continue B12 injections monthly I plan to monitor her B12 closely, repeat level is slightly better 

## 2022-06-05 NOTE — Assessment & Plan Note (Signed)
We discussed goals of care She is aware of low response rates We discussed the role of palliative treatment 

## 2022-06-05 NOTE — Progress Notes (Signed)
[facility] OFFICE PROGRESS NOTE  Patient Care Team: Default, Provider, MD as PCP - General  ASSESSMENT & PLAN:  Malignant neoplasm of cervix [facility]) The patient has failed multiple lines of treatment but decided to continue on palliative treatment We discussed the risk and benefits of palliative chemotherapy versus supportive care only I estimated the benefit from future treatment would be less than 20%, at the expense of risk of complications, reduction in quality of life and potentially life-threatening complications that could shorten her life  We reviewed the NCCN guidelines We discussed the risk, benefits, side effects of single agent gemcitabine versus topotecan versus vinorelbine Ultimately, the patient elected to try single agent gemcitabine I recommend minimum 3 months of treatment before repeat imaging study for assessment of response to therapy at the end of the year  Vitamin B12 deficiency We will continue B12 injections monthly I plan to monitor her B12 closely, repeat level is slightly better  Goals of care, counseling/discussion We discussed goals of care She is aware of low response rates We discussed the role of palliative treatment  Orders Placed This Encounter  Procedures   CBC with Differential ([place] Only)    Standing Status:   Future    Standing Expiration Date:   06/13/2023   CMP ([place] only)    Standing Status:   Future    Standing Expiration Date:   06/13/2023   CBC with Differential ([place] Only)    Standing Status:   Future    Standing Expiration Date:   06/20/2023   CMP ([place] only)    Standing Status:   Future    Standing Expiration Date:   06/20/2023   CBC with Differential ([place] Only)    Standing Status:   Future    Standing Expiration Date:   07/04/2023   CMP ([place] only)    Standing Status:   Future    Standing Expiration Date:   07/04/2023   CBC with Differential ([place]  Only)    Standing Status:   Future    Standing Expiration Date:   07/11/2023   CMP ([place] only)    Standing Status:   Future    Standing Expiration Date:   07/11/2023   CBC with Differential ([place] Only)    Standing Status:   Future    Standing Expiration Date:   07/25/2023   CMP ([place] only)    Standing Status:   Future    Standing Expiration Date:   07/25/2023   CBC with Differential ([place] Only)    Standing Status:   Future    Standing Expiration Date:   08/01/2023   CMP ([place] only)    Standing Status:   Future    Standing Expiration Date:   08/01/2023   CBC with Differential ([place] Only)    Standing Status:   Future    Standing Expiration Date:   08/15/2023   CMP ([place] only)    Standing Status:   Future    Standing Expiration Date:   08/15/2023   CBC with Differential ([place] Only)    Standing Status:   Future    Standing Expiration Date:   08/22/2023   CMP ([place] only)    Standing Status:   Future    Standing Expiration Date:   08/22/2023    All questions were answered. The patient knows to call the clinic with any problems, questions or concerns.  The total time spent in the appointment was 40 minutes encounter with patients including review of chart and various tests results, discussions about plan of care and coordination of care plan   [name], MD 06/05/2022 3:35 PM  INTERVAL HISTORY: Please see below for problem oriented charting. she returns for treatment follow-up and to review treatment options She denies pelvic pain, discharge or bleeding She desired further chemotherapy  REVIEW OF SYSTEMS:   Constitutional: Denies fevers, chills or abnormal weight loss Eyes: Denies blurriness of vision Ears, nose, mouth, throat, and face: Denies mucositis or sore throat Respiratory: Denies cough, dyspnea or wheezes Cardiovascular: Denies palpitation, chest discomfort or lower extremity  swelling Gastrointestinal:  Denies nausea, heartburn or change in bowel habits Skin: Denies abnormal skin rashes Lymphatics: Denies new lymphadenopathy or easy bruising Neurological:Denies numbness, tingling or new weaknesses Behavioral/Psych: Mood is stable, no new changes  All other systems were reviewed with the patient and are negative.  I have reviewed the past medical history, past surgical history, social history and family history with the patient and they are unchanged from previous note.  ALLERGIES:  has No Known Allergies.  MEDICATIONS:  Current Outpatient Medications  Medication Sig Dispense Refill   levothyroxine (SYNTHROID) 100 MCG tablet Take 1 tablet (100 mcg total) by mouth daily before breakfast. 30 tablet 0   lidocaine-prilocaine (EMLA) cream Apply to affected area once 30 g 3   Multiple Vitamin (MULTIVITAMIN) tablet Take 1 tablet by mouth daily.     ondansetron (ZOFRAN) 8 MG tablet Take 1 tablet (8 mg total) by mouth 2 (two) times daily as needed (Nausea or vomiting). 30 tablet 1   prochlorperazine (COMPAZINE) 10 MG tablet Take 1 tablet (10 mg total) by mouth every 6 (six) hours as needed (Nausea or vomiting). 30 tablet 1   No current facility-administered medications for this visit.   Facility-Administered Medications Ordered in Other Visits  Medication Dose Route Frequency Provider Last Rate Last Admin   diphenhydrAMINE (BENADRYL) 50 MG/ML injection            famotidine (PEPCID) 20-0.9 MG/50ML-% IVPB             SUMMARY OF ONCOLOGIC HISTORY: Oncology History Overview Note  Cancer Staging Malignant neoplasm of cervix ([place]) Staging form: Cervix Uteri, AJCC 8th Edition - Clinical: Stage IIIB (cT3b, cN1, cM0) - Signed by [name], MD on 05/04/2018  PD-L1 1% Progressed on carboplatin, pembrolizumab, cisplatin and taxol   Malignant neoplasm of cervix ([place])  03/13/2018 Imaging   US pelvis There are mobile echogenic foci within the endometrial cavity  suggestive of gas. This is nonspecific in etiology however may be secondary to endometritis. Correlate for history of recent instrumentation. This obscures visualization of the endometrial tissue and therefore a follow-up pelvic ultrasound in 2-4 weeks is recommended after resolution of the acute symptomatology if symptoms persist to further evaluate the endometrium.     03/13/2018 Initial Diagnosis   She initially presented with PMB   04/12/2018 Pathology Results   Cervix, biopsy, mass - INVASIVE SQUAMOUS CELL CARCINOMA - SEE COMMENT   04/12/2018 Surgery   PREOPERATIVE DIAGNOSIS:  Postmenopausal vaginal bleeding. POSTOPERATIVE DIAGNOSIS: The same PROCEDURE: Exam under anesthesia, pap smear, cervical mass biopsy SURGEON:  Dr. [name]   INDICATIONS: 70 y.o. yo [id number] with PMB here for exam under anesthesia.Risks of surgery were discussed with the patient including but not limited to: bleeding which may require transfusion; infection which may require antibiotics; injury to uterus or surrounding organs; need  for additional procedures including laparotomy or laparoscopy; and other postoperative/anesthesia complications. Written informed consent was obtained.     FINDINGS:  An 8-week size midline uterus.  No adnexal mass palpable on exam. Normal cervix not visualized. Friable mass seen in involving the vagina at the level of the cervix obliterating the anterior and posterior fornix.   ANESTHESIA:   General INTRAVENOUS FLUIDS:  300 ml of LR ESTIMATED BLOOD LOSS: 20 ml. SPECIMENS: pap smear, cervical mass biopsy COMPLICATIONS:  None immediate.     04/25/2018 PET scan   1. Large cervical mass may invade into the myometrium in down into the vagina, maximum SUV 21.3. There is a malignant left external iliac node measuring 1.4 cm in short axis with maximum SUV 14.1. 2. Accentuated activity in the cecum and ascending colon is likely physiologic given that it has no CT correlate. Correlation  with the patient's colon cancer screening history is recommended. If screening is not up-to-date, appropriate screening should be considered. 3.  Aortic Atherosclerosis (ICD10-I70.0).     04/28/2018 Surgery   Pre-operative Diagnosis:  At least Stage 2B SCCa Cervical Cancer   Post-operative Diagnosis: At least clinical Stage 3A SCCa Cervical Cancer   Operation:  Exam under anesthesia due to intolerance for vaginal exam in office Cystoscopy due to concern for extension to posterior bladder (from anterior vaginal wall lesion)   Operative Findings:   Parametrial involvement on right  Right sided subvaginal tumor burden ~3cm. This approximates the right ureteral insertion into the bladder based on my exam during cystoscopy. Extension of disease down upper half of vagina on lateral and posterior walls. Extension of disease down to lower third of anterior vagina (versus skip lesion) thus making her Stage 3A Cystoscopy revealed patent bilateral ureteral orifices and no bladder mucosa invasion or areas of concern. Rectal exam grossly no disease.     05/04/2018 Cancer Staging   Staging form: Cervix Uteri, AJCC 8th Edition - Clinical: Stage IIIB (cT3b, cN1, cM0) - Signed by [name], MD on 05/04/2018   05/12/2018 Procedure   Successful 8 French right internal jugular vein power port placement with its tip at the SVC/RA junction   05/13/2018 - 06/24/2018 Chemotherapy   The patient had weekly cisplatin given with concurrent radiation    05/17/2018 - 07/27/2018 Radiation Therapy   Radiation treatment dates:   05/17/2018-07/27/2018   Site/dose: 1. Cervix, 1.8 Gy in 25 fractions for a total dose of 45 Gy                    2. Pelvis  Boost, 1.8 Gy in 5 fractions for a total dose of 9 Gy                    3. Cervix, 5.5 Gy in 5 fractions for a total dose of 27.5 Gy   06/03/2018 Adverse Reaction   She missed her chemo due to severe depression and was sent to the ER for suicidal ideation.  She was  seen by psychiatrist.   10/27/2018 PET scan   IMPRESSION: 1. Complete metabolic response to therapy, without residual or recurrent hypermetabolic disease. 2.  Aortic Atherosclerosis (ICD10-I70.0).     11/17/2018 Imaging   Successful right IJ vein Port-A-Cath explant.   03/29/2020 PET scan   1. Hypermetabolic lesion along the right vaginal cuff is highly worrisome for recurrent cervical cancer. No evidence of distant metastatic disease. 2.  Aortic atherosclerosis (ICD10-I70.0).   04/26/2020 - 12/10/2020 Chemotherapy   The  patient had CARBOplatin  for chemotherapy treatment.      Genetic Testing   Patient has genetic testing done for PD-L1. Results revealed patient has the following:  PD-L1 combined positive score (CPS): 1%   07/08/2020 Imaging   1. Unchanged post treatment appearance of the pelvis, including a soft tissue nodule in the low right hemipelvis adjacent to the vagina measuring 2.8 x 1.7 cm, previously FDG avid and consistent with malignancy. 2. No evidence of nodal or distant metastatic disease in the abdomen or pelvis. 3. Aortic Atherosclerosis (ICD10-I70.0).   10/14/2020 Imaging   1. Unchanged right eccentric soft tissue mass in the low right hemipelvis centered on the cervix and vagina, measuring 2.9 x 1.9 cm. 2. New small volume fluid within the endometrial cavity, abnormal in the postmenopausal setting and suggesting obstruction of the cervical os by soft tissue mass. 3. No evidence of lymphadenopathy or distant metastatic disease in the abdomen or pelvis.   Aortic Atherosclerosis (ICD10-I70.0).   12/24/2020 Imaging   1. Abnormal hypodense masslike appearance continuous with hypodense masslike lesion probably centrally within a right eccentric cervix (versus right paracervical mass). The endometrial portion has enlarged compared to 10/14/2020 and active malignancy is suspected. 2. No adenopathy or findings of distant spread. 3. Other imaging findings of potential  clinical significance: Aortic Atherosclerosis (ICD10-I70.0). Mild cardiomegaly. Stable scarring in the left lower lobe. Sigmoid colon scattered diverticula. Tarlov cysts at the S2 level. Vertical sclerosis in the sacral ala possibly due to stress fractures or prior radiation therapy.     01/06/2021 - 06/19/2021 Chemotherapy   Patient is on Treatment Plan : UTERINE Pembrolizumab q21d      04/07/2021 Imaging   1. The previously hypermetabolic right vaginal mass currently measures 12 cubic cm in volume, previously 11 cubic cm on 12/23/2020. 2. No new adenopathy or new findings of distant metastatic spread. 3. New wall thickening in several loops of distal ileum suspicious for inflammation/enteritis. 4. Other imaging findings of potential clinical significance: Mild cardiomegaly. Mild sigmoid colon diverticulosis. Stable bony demineralization with vertical sclerosis in the sacral ala likely from old insufficiency fracture.   07/10/2021 Imaging   1. New moderate right-sided hydroureteronephrosis with decreased perfusion to the right kidney compatible with obstructive uropathy.  2. Interval progression of the heterogeneous low-density uterine lesion, with progression of enhancing soft tissue extending inferiorly to the right, compatible with previously described disease in the right vaginal wall. Represents the site of right ureteral obstruction. 3. No evidence for new metastatic disease in the abdomen or pelvis. 4. Bilateral sacral insufficiency fractures. 5. Trace free fluid in the pelvis. 6. Aortic Atherosclerosis (ICD10-I70.0).     07/22/2021 Procedure   Successful placement of a right internal jugular approach power injectable Port-A-Cath.     07/24/2021 - 10/02/2021 Chemotherapy   Patient is on Treatment Plan : cervical cancer Cisplatin q7d       10/16/2021 Imaging   1. Mild interval increase in size of heterogeneous tumor arising from within the uterus with local tumor extension into the  right pelvic sidewall. 2. Persistent right-sided obstructive uropathy with severe right hydronephrosis and progressive right renal cortical volume loss. Right hydroureter extends into the pelvis where it is obstructed by the uterine/cervical mass. 3. Bilateral sacral insufficiency fractures. 4. Aortic Atherosclerosis (ICD10-I70.0).   11/06/2021 - 05/15/2022 Chemotherapy   Patient is on Treatment Plan : Cervical Paclitaxel q7d     02/10/2022 Imaging   1. Slight interval decrease in size of a heterogeneous mass within the right  pelvis, involving the adjacent right aspect of the uterus and closely abutting the posterior urinary bladder,, consistent with treatment response. Fat stranding and presacral soft tissue thickening in the adjacent pelvis, unchanged. 2. Unchanged severe right hydronephrosis and hydroureter, the distal right ureter obstructed by mass in the pelvis, again with slight progression of right renal cortical atrophy. 3. Unchanged, mildly sclerotic nondisplaced bilateral sacral insufficiency fractures.   Aortic Atherosclerosis (ICD10-I70.0).   05/28/2022 Imaging   1. Slight interval increase in size of the lower uterine/cervical mass. 2. Persistent severe right-sided hydroureteronephrosis down to the level of the uterine/cervical mass. 3. Stable right-sided posterior bladder wall thickening. 4. No findings for metastatic disease involving the abdomen/pelvis.   Aortic Atherosclerosis (ICD10-I70.0).   05/29/2022 - 05/29/2022 Chemotherapy   Patient is on Treatment Plan : OVARIAN Paclitaxel (80) D1,8,15,22 q28d     06/12/2022 -  Chemotherapy   Patient is on Treatment Plan : Cervix Gemcitabine D1,8 (1000) q21d       PHYSICAL EXAMINATION: ECOG PERFORMANCE STATUS: 2 - Symptomatic, <50% confined to bed  Vitals:   06/05/22 1414  BP: (!) 151/79  Pulse: 77  Resp: 16  Temp: 99.7 F (37.6 C)  SpO2: 98%   Filed Weights   06/05/22 1414  Weight: 92 lb 6.4 oz (41.9 kg)     GENERAL:alert, no distress and comfortable   NEURO: alert & oriented x 3 with fluent speech, no focal motor/sensory deficits  LABORATORY DATA:  I have reviewed the data as listed    Component Value Date/Time   NA 139 05/27/2022 0939   K 4.0 05/27/2022 0939   CL 107 05/27/2022 0939   CO2 27 05/27/2022 0939   GLUCOSE 104 (H) 05/27/2022 0939   BUN 22 05/27/2022 0939   CREATININE 1.01 (H) 05/27/2022 0939   CALCIUM 9.2 05/27/2022 0939   PROT 6.6 05/27/2022 0939   ALBUMIN 4.1 05/27/2022 0939   AST 16 05/27/2022 0939   ALT 7 05/27/2022 0939   ALKPHOS 44 05/27/2022 0939   BILITOT 0.3 05/27/2022 0939   GFRNONAA >60 05/27/2022 0939   GFRAA >60 06/14/2020 0844   GFRAA >60 03/11/2020 0951    No results found for: "SPEP", "UPEP"  Lab Results  Component Value Date   WBC 5.1 05/27/2022   NEUTROABS 3.2 05/27/2022   HGB 10.3 (L) 05/27/2022   HCT 31.5 (L) 05/27/2022   MCV 92.6 05/27/2022   PLT 229 05/27/2022      Chemistry      Component Value Date/Time   NA 139 05/27/2022 0939   K 4.0 05/27/2022 0939   CL 107 05/27/2022 0939   CO2 27 05/27/2022 0939   BUN 22 05/27/2022 0939   CREATININE 1.01 (H) 05/27/2022 0939      Component Value Date/Time   CALCIUM 9.2 05/27/2022 0939   ALKPHOS 44 05/27/2022 0939   AST 16 05/27/2022 0939   ALT 7 05/27/2022 0939   BILITOT 0.3 05/27/2022 0939       RADIOGRAPHIC STUDIES: I have personally reviewed the radiological images as listed and agreed with the findings in the report. CT ABDOMEN PELVIS W CONTRAST  Result Date: 05/28/2022 CLINICAL DATA:  History of cervical cancer. Assess treatment response. * Tracking Code: BO * EXAM: CT ABDOMEN AND PELVIS WITH CONTRAST TECHNIQUE: Multidetector CT imaging of the abdomen and pelvis was performed using the standard protocol following bolus administration of intravenous contrast. RADIATION DOSE REDUCTION: This exam was performed according to the departmental dose-optimization program which  includes automated exposure control,  adjustment of the mA and/or kV according to patient size and/or use of iterative reconstruction technique. CONTRAST:  48mL OMNIPAQUE IOHEXOL 300 MG/ML  SOLN COMPARISON:  Numerous prior imaging studies. The most recent CT scan is 02/05/2022 FINDINGS: Lower chest: Stable basilar scarring changes. No pulmonary lesions or pleural effusions. The heart is normal in size. No pericardial effusion. Hepatobiliary: No hepatic lesions or intrahepatic biliary dilatation. The gallbladder is unremarkable. No common bile duct dilatation. Pancreas: No mass, inflammation or ductal dilatation. Spleen: Normal size.  No focal lesions. Adrenals/Urinary Tract: Adrenal glands are normal. Persistent severe right-sided hydroureteronephrosis down to the level of the uterine/cervical mass. No left-sided hydronephrosis. No worrisome renal lesions. Mild right posterior bladder wall thickening. Could not exclude tumor involvement. Stomach/Bowel: The stomach, duodenum, small bowel and colon are unremarkable. Vascular/Lymphatic: Stable scattered aortic calcifications but no aneurysm or dissection. The major venous structures are patent. No mesenteric or retroperitoneal mass or lymphadenopathy. No pelvic lymphadenopathy or inguinal lymphadenopathy the. Reproductive: The lower uterine/cervical mass measures 6.8 x 5.2 x 3.5 cm and previously measured 6.2 x 4.5 x 2.7 cm. Other: Small amount of free pelvic fluid but no overt ascites. No omental or peritoneal surface lesions are identified. Musculoskeletal: No significant bony findings. Stable sacral Tarlov cysts. Healed sacral1 insufficiency fractures. IMPRESSION: 1. Slight interval increase in size of the lower uterine/cervical mass. 2. Persistent severe right-sided hydroureteronephrosis down to the level of the uterine/cervical mass. 3. Stable right-sided posterior bladder wall thickening. 4. No findings for metastatic disease involving the abdomen/pelvis. Aortic  Atherosclerosis (ICD10-I70.0). Electronically Signed   By: Marijo Sanes M.D.   On: 05/28/2022 11:21

## 2022-06-05 NOTE — Progress Notes (Signed)
DISCONTINUE OFF PATHWAY REGIMEN - Other   OFF00010:Paclitaxel 80 mg/m2 Weekly:   Administer weekly:     Paclitaxel   **Always confirm dose/schedule in your pharmacy ordering system**  REASON: Disease Progression PRIOR TREATMENT: Paclitaxel 80 mg/m2 Weekly TREATMENT RESPONSE: Progressive Disease (PD)  START OFF PATHWAY REGIMEN - Other   [id number]:Gemcitabine 1,000 mg/m2 D1, 8  q21 Days:   A cycle is every 21 days:     Gemcitabine   **Always confirm dose/schedule in your pharmacy ordering system**  Patient Characteristics: Intent of Therapy: Non-Curative / Palliative Intent, Discussed with Patient

## 2022-06-05 NOTE — Assessment & Plan Note (Signed)
The patient has failed multiple lines of treatment but decided to continue on palliative treatment We discussed the risk and benefits of palliative chemotherapy versus supportive care only I estimated the benefit from future treatment would be less than 20%, at the expense of risk of complications, reduction in quality of life and potentially life-threatening complications that could shorten her life  We reviewed the NCCN guidelines We discussed the risk, benefits, side effects of single agent gemcitabine versus topotecan versus vinorelbine Ultimately, the patient elected to try single agent gemcitabine I recommend minimum 3 months of treatment before repeat imaging study for assessment of response to therapy at the end of the year

## 2022-06-06 ENCOUNTER — Other Ambulatory Visit: Payer: Self-pay

## 2022-06-11 ENCOUNTER — Other Ambulatory Visit: Payer: Self-pay

## 2022-06-12 ENCOUNTER — Inpatient Hospital Stay: Payer: Medicare Other

## 2022-06-12 ENCOUNTER — Other Ambulatory Visit: Payer: Self-pay

## 2022-06-12 VITALS — BP 132/79 | HR 83 | Temp 98.6°F | Resp 18 | Wt 92.0 lb

## 2022-06-12 DIAGNOSIS — Z7189 Other specified counseling: Secondary | ICD-10-CM

## 2022-06-12 DIAGNOSIS — E538 Deficiency of other specified B group vitamins: Secondary | ICD-10-CM

## 2022-06-12 DIAGNOSIS — D61818 Other pancytopenia: Secondary | ICD-10-CM

## 2022-06-12 DIAGNOSIS — Z5111 Encounter for antineoplastic chemotherapy: Secondary | ICD-10-CM | POA: Diagnosis not present

## 2022-06-12 DIAGNOSIS — E039 Hypothyroidism, unspecified: Secondary | ICD-10-CM

## 2022-06-12 DIAGNOSIS — C539 Malignant neoplasm of cervix uteri, unspecified: Secondary | ICD-10-CM

## 2022-06-12 LAB — CBC WITH DIFFERENTIAL (CANCER CENTER ONLY)
Abs Immature Granulocytes: 0.06 10*3/uL (ref 0.00–0.07)
Basophils Absolute: 0 10*3/uL (ref 0.0–0.1)
Basophils Relative: 1 %
Eosinophils Absolute: 0.1 10*3/uL (ref 0.0–0.5)
Eosinophils Relative: 2 %
HCT: 32.3 % — ABNORMAL LOW (ref 36.0–46.0)
Hemoglobin: 10.7 g/dL — ABNORMAL LOW (ref 12.0–15.0)
Immature Granulocytes: 1 %
Lymphocytes Relative: 14 %
Lymphs Abs: 0.9 10*3/uL (ref 0.7–4.0)
MCH: 30.2 pg (ref 26.0–34.0)
MCHC: 33.1 g/dL (ref 30.0–36.0)
MCV: 91.2 fL (ref 80.0–100.0)
Monocytes Absolute: 0.7 10*3/uL (ref 0.1–1.0)
Monocytes Relative: 11 %
Neutro Abs: 4.5 10*3/uL (ref 1.7–7.7)
Neutrophils Relative %: 71 %
Platelet Count: 203 10*3/uL (ref 150–400)
RBC: 3.54 MIL/uL — ABNORMAL LOW (ref 3.87–5.11)
RDW: 14.4 % (ref 11.5–15.5)
WBC Count: 6.2 10*3/uL (ref 4.0–10.5)
nRBC: 0 % (ref 0.0–0.2)

## 2022-06-12 LAB — CMP (CANCER CENTER ONLY)
ALT: 7 U/L (ref 0–44)
AST: 16 U/L (ref 15–41)
Albumin: 4.1 g/dL (ref 3.5–5.0)
Alkaline Phosphatase: 46 U/L (ref 38–126)
Anion gap: 6 (ref 5–15)
BUN: 21 mg/dL (ref 8–23)
CO2: 27 mmol/L (ref 22–32)
Calcium: 8.8 mg/dL — ABNORMAL LOW (ref 8.9–10.3)
Chloride: 107 mmol/L (ref 98–111)
Creatinine: 1.04 mg/dL — ABNORMAL HIGH (ref 0.44–1.00)
GFR, Estimated: 58 mL/min — ABNORMAL LOW (ref >60)
Glucose, Bld: 96 mg/dL (ref 70–99)
Potassium: 3.6 mmol/L (ref 3.5–5.1)
Sodium: 140 mmol/L (ref 135–145)
Total Bilirubin: 0.4 mg/dL (ref 0.3–1.2)
Total Protein: 7.3 g/dL (ref 6.5–8.1)

## 2022-06-12 LAB — TSH: TSH: 1.732 u[IU]/mL (ref 0.350–4.500)

## 2022-06-12 MED ORDER — SODIUM CHLORIDE 0.9% FLUSH
10.0000 mL | INTRAVENOUS | Status: DC | PRN
  Administered 2022-06-12: 10 mL

## 2022-06-12 MED ORDER — SODIUM CHLORIDE 0.9 % IV SOLN
1000.0000 mg/m2 | Freq: Once | INTRAVENOUS | Status: AC
  Administered 2022-06-12: 1368 mg via INTRAVENOUS
  Filled 2022-06-12: qty 35.98

## 2022-06-12 MED ORDER — PROCHLORPERAZINE MALEATE 10 MG PO TABS
10.0000 mg | ORAL_TABLET | Freq: Once | ORAL | Status: AC
  Administered 2022-06-12: 10 mg via ORAL
  Filled 2022-06-12: qty 1

## 2022-06-12 MED ORDER — HEPARIN SOD (PORK) LOCK FLUSH 100 UNIT/ML IV SOLN
500.0000 [IU] | Freq: Once | INTRAVENOUS | Status: AC | PRN
  Administered 2022-06-12: 500 [IU]

## 2022-06-12 MED ORDER — SODIUM CHLORIDE 0.9 % IV SOLN: Freq: Once | INTRAVENOUS | Status: AC

## 2022-06-12 MED ORDER — SODIUM CHLORIDE 0.9% FLUSH
10.0000 mL | Freq: Once | INTRAVENOUS | Status: AC
  Administered 2022-06-12: 10 mL

## 2022-06-12 NOTE — Patient Instructions (Signed)
[facility]  Discharge Instructions: Thank you for choosing [facility] to provide your oncology and hematology care.   If you have a lab appointment with the [facility], please go directly to the [facility] and check in at the registration area.   Wear comfortable clothing and clothing appropriate for easy access to any Portacath or PICC line.   We strive to give you quality time with your provider. You may need to reschedule your appointment if you arrive late (15 or more minutes).  Arriving late affects you and other patients whose appointments are after yours.  Also, if you miss three or more appointments without notifying the office, you may be dismissed from the clinic at the provider's discretion.      For prescription refill requests, have your pharmacy contact our office and allow 72 hours for refills to be completed.    Today you received the following chemotherapy and/or immunotherapy agents: gemcitabine (Gemzar)      To help prevent nausea and vomiting after your treatment, we encourage you to take your nausea medication as directed.  BELOW ARE SYMPTOMS THAT SHOULD BE REPORTED IMMEDIATELY: *FEVER GREATER THAN 100.4 F (38 C) OR HIGHER *CHILLS OR SWEATING *NAUSEA AND VOMITING THAT IS NOT CONTROLLED WITH YOUR NAUSEA MEDICATION *UNUSUAL SHORTNESS OF BREATH *UNUSUAL BRUISING OR BLEEDING *URINARY PROBLEMS (pain or burning when urinating, or frequent urination) *BOWEL PROBLEMS (unusual diarrhea, constipation, pain near the anus) TENDERNESS IN MOUTH AND THROAT WITH OR WITHOUT PRESENCE OF ULCERS (sore throat, sores in mouth, or a toothache) UNUSUAL RASH, SWELLING OR PAIN  UNUSUAL VAGINAL DISCHARGE OR ITCHING   Items with * indicate a potential emergency and should be followed up as soon as possible or go to the Emergency Department if any problems should occur.  Please show the CHEMOTHERAPY ALERT CARD or IMMUNOTHERAPY ALERT CARD at  check-in to the Emergency Department and triage nurse.  Should you have questions after your visit or need to cancel or reschedule your appointment, please contact [facility]  Dept: [phone]  and follow the prompts.  Office hours are 8:00 a.m. to 4:30 p.m. Monday - Friday. Please note that voicemails left after 4:00 p.m. may not be returned until the following business day.  We are closed weekends and major holidays. You have access to a nurse at all times for urgent questions. Please call the main number to the clinic Dept: [phone] and follow the prompts.   For any non-urgent questions, you may also contact your provider using MyChart. We now offer e-Visits for anyone 63 and older to request care online for non-urgent symptoms. For details visit [email or web address].   Also download the MyChart app! Go to the app store, search "MyChart", open the app, select [facility], and log in with your MyChart username and password.  Masks are optional in the cancer centers. If you would like for your care team to wear a mask while they are taking care of you, please let them know. You may have one support person who is at least 70 years old accompany you for your appointments.   Gemcitabine Injection What is this medication? GEMCITABINE (jem SYE ta been) treats some types of cancer. It works by slowing down the growth of cancer cells. This medicine may be used for other purposes; ask your health care provider or pharmacist if you have questions. COMMON BRAND NAME(S): Gemzar, Infugem What should I tell my care team before I  take this medication? They need to know if you have any of these conditions: Blood disorders Infection Kidney disease Liver disease Lung or breathing disease, such as asthma or COPD Recent or ongoing radiation therapy An unusual or allergic reaction to gemcitabine, other medications, foods, dyes, or preservatives If you or your partner  are pregnant or trying to get pregnant Breast-feeding How should I use this medication? This medication is injected into a vein. It is given by your care team in a hospital or clinic setting. Talk to your care team about the use of this medication in children. Special care may be needed. Overdosage: If you think you have taken too much of this medicine contact a poison control center or emergency room at once. NOTE: This medicine is only for you. Do not share this medicine with others. What if I miss a dose? Keep appointments for follow-up doses. It is important not to miss your dose. Call your care team if you are unable to keep an appointment. What may interact with this medication? Interactions have not been studied. This list may not describe all possible interactions. Give your health care provider a list of all the medicines, herbs, non-prescription drugs, or dietary supplements you use. Also tell them if you smoke, drink alcohol, or use illegal drugs. Some items may interact with your medicine. What should I watch for while using this medication? Your condition will be monitored carefully while you are receiving this medication. This medication may make you feel generally unwell. This is not uncommon, as chemotherapy can affect healthy cells as well as cancer cells. Report any side effects. Continue your course of treatment even though you feel ill unless your care team tells you to stop. In some cases, you may be given additional medications to help with side effects. Follow all directions for their use. This medication may increase your risk of getting an infection. Call your care team for advice if you get a fever, chills, sore throat, or other symptoms of a cold or flu. Do not treat yourself. Try to avoid being around people who are sick. This medication may increase your risk to bruise or bleed. Call your care team if you notice any unusual bleeding. Be careful brushing or flossing your  teeth or using a toothpick because you may get an infection or bleed more easily. If you have any dental work done, tell your dentist you are receiving this medication. Avoid taking medications that contain aspirin, acetaminophen, ibuprofen, naproxen, or ketoprofen unless instructed by your care team. These medications may hide a fever. Talk to your care team if you or your partner wish to become pregnant or think you might be pregnant. This medication can cause serious birth defects if taken during pregnancy and for 6 months after the last dose. A negative pregnancy test is required before starting this medication. A reliable form of contraception is recommended while taking this medication and for 6 months after the last dose. Talk to your care team about effective forms of contraception. Do not father a child while taking this medication and for 3 months after the last dose. Use a condom while having sex during this time period. Do not breastfeed while taking this medication and for at least 1 week after the last dose. This medication may cause infertility. Talk to your care team if you are concerned about your fertility. What side effects may I notice from receiving this medication? Side effects that you should report to your care team  as soon as possible: Allergic reactions--skin rash, itching, hives, swelling of the face, lips, tongue, or throat Capillary leak syndrome--stomach or muscle pain, unusual weakness or fatigue, feeling faint or lightheaded, decrease in the amount of urine, swelling of the ankles, hands, or feet, trouble breathing Infection--fever, chills, cough, sore throat, wounds that don't heal, pain or trouble when passing urine, general feeling of discomfort or being unwell Liver injury--right upper belly pain, loss of appetite, nausea, light-colored stool, dark yellow or brown urine, yellowing skin or eyes, unusual weakness or fatigue Low red blood cell level--unusual weakness or  fatigue, dizziness, headache, trouble breathing Lung injury--shortness of breath or trouble breathing, cough, spitting up blood, chest pain, fever Stomach pain, bloody diarrhea, pale skin, unusual weakness or fatigue, decrease in the amount of urine, which may be signs of hemolytic uremic syndrome Sudden and severe headache, confusion, change in vision, seizures, which may be signs of posterior reversible encephalopathy syndrome (PRES) Unusual bruising or bleeding Side effects that usually do not require medical attention (report to your care team if they continue or are bothersome): Diarrhea Drowsiness Hair loss Nausea Pain, redness, or swelling with sores inside the mouth or throat Vomiting This list may not describe all possible side effects. Call your doctor for medical advice about side effects. You may report side effects to FDA at 1-800-FDA-1088. Where should I keep my medication? This medication is given in a hospital or clinic. It will not be stored at home. NOTE: This sheet is a summary. It may not cover all possible information. If you have questions about this medicine, talk to your doctor, pharmacist, or health care provider.  2023 Elsevier/Gold Standard (2021-12-31 00:00:00)

## 2022-06-15 ENCOUNTER — Other Ambulatory Visit: Payer: Self-pay

## 2022-06-18 ENCOUNTER — Inpatient Hospital Stay: Payer: Medicare Other

## 2022-06-18 ENCOUNTER — Inpatient Hospital Stay: Payer: Medicare Other | Attending: Hematology and Oncology | Admitting: Hematology and Oncology

## 2022-06-18 ENCOUNTER — Encounter: Payer: Self-pay | Admitting: Hematology and Oncology

## 2022-06-18 ENCOUNTER — Other Ambulatory Visit: Payer: Self-pay

## 2022-06-18 VITALS — BP 130/81 | HR 75 | Temp 97.7°F | Resp 18 | Ht 62.0 in | Wt 93.0 lb

## 2022-06-18 DIAGNOSIS — Z923 Personal history of irradiation: Secondary | ICD-10-CM | POA: Diagnosis not present

## 2022-06-18 DIAGNOSIS — Z7189 Other specified counseling: Secondary | ICD-10-CM | POA: Diagnosis not present

## 2022-06-18 DIAGNOSIS — R1900 Intra-abdominal and pelvic swelling, mass and lump, unspecified site: Secondary | ICD-10-CM | POA: Insufficient documentation

## 2022-06-18 DIAGNOSIS — C539 Malignant neoplasm of cervix uteri, unspecified: Secondary | ICD-10-CM

## 2022-06-18 DIAGNOSIS — E538 Deficiency of other specified B group vitamins: Secondary | ICD-10-CM | POA: Diagnosis not present

## 2022-06-18 DIAGNOSIS — Z79890 Hormone replacement therapy (postmenopausal): Secondary | ICD-10-CM | POA: Insufficient documentation

## 2022-06-18 DIAGNOSIS — K5730 Diverticulosis of large intestine without perforation or abscess without bleeding: Secondary | ICD-10-CM | POA: Insufficient documentation

## 2022-06-18 DIAGNOSIS — Z9221 Personal history of antineoplastic chemotherapy: Secondary | ICD-10-CM | POA: Diagnosis not present

## 2022-06-18 DIAGNOSIS — Z5111 Encounter for antineoplastic chemotherapy: Secondary | ICD-10-CM | POA: Insufficient documentation

## 2022-06-18 DIAGNOSIS — I700 Atherosclerosis of aorta: Secondary | ICD-10-CM | POA: Diagnosis not present

## 2022-06-18 DIAGNOSIS — E039 Hypothyroidism, unspecified: Secondary | ICD-10-CM | POA: Diagnosis not present

## 2022-06-18 DIAGNOSIS — N1330 Unspecified hydronephrosis: Secondary | ICD-10-CM | POA: Diagnosis not present

## 2022-06-18 DIAGNOSIS — D61818 Other pancytopenia: Secondary | ICD-10-CM

## 2022-06-18 LAB — CMP (CANCER CENTER ONLY)
ALT: 8 U/L (ref 0–44)
AST: 17 U/L (ref 15–41)
Albumin: 4.1 g/dL (ref 3.5–5.0)
Alkaline Phosphatase: 46 U/L (ref 38–126)
Anion gap: 5 (ref 5–15)
BUN: 20 mg/dL (ref 8–23)
CO2: 28 mmol/L (ref 22–32)
Calcium: 8.7 mg/dL — ABNORMAL LOW (ref 8.9–10.3)
Chloride: 106 mmol/L (ref 98–111)
Creatinine: 1.27 mg/dL — ABNORMAL HIGH (ref 0.44–1.00)
GFR, Estimated: 45 mL/min — ABNORMAL LOW (ref >60)
Glucose, Bld: 94 mg/dL (ref 70–99)
Potassium: 4.2 mmol/L (ref 3.5–5.1)
Sodium: 139 mmol/L (ref 135–145)
Total Bilirubin: 0.3 mg/dL (ref 0.3–1.2)
Total Protein: 7.2 g/dL (ref 6.5–8.1)

## 2022-06-18 LAB — CBC WITH DIFFERENTIAL (CANCER CENTER ONLY)
Abs Immature Granulocytes: 0.03 10*3/uL (ref 0.00–0.07)
Basophils Absolute: 0 10*3/uL (ref 0.0–0.1)
Basophils Relative: 0 %
Eosinophils Absolute: 0.1 10*3/uL (ref 0.0–0.5)
Eosinophils Relative: 2 %
HCT: 30.2 % — ABNORMAL LOW (ref 36.0–46.0)
Hemoglobin: 9.8 g/dL — ABNORMAL LOW (ref 12.0–15.0)
Immature Granulocytes: 1 %
Lymphocytes Relative: 17 %
Lymphs Abs: 0.8 10*3/uL (ref 0.7–4.0)
MCH: 29.6 pg (ref 26.0–34.0)
MCHC: 32.5 g/dL (ref 30.0–36.0)
MCV: 91.2 fL (ref 80.0–100.0)
Monocytes Absolute: 0.2 10*3/uL (ref 0.1–1.0)
Monocytes Relative: 4 %
Neutro Abs: 3.6 10*3/uL (ref 1.7–7.7)
Neutrophils Relative %: 76 %
Platelet Count: 140 10*3/uL — ABNORMAL LOW (ref 150–400)
RBC: 3.31 MIL/uL — ABNORMAL LOW (ref 3.87–5.11)
RDW: 14.1 % (ref 11.5–15.5)
WBC Count: 4.7 10*3/uL (ref 4.0–10.5)
nRBC: 0 % (ref 0.0–0.2)

## 2022-06-18 MED ORDER — SODIUM CHLORIDE 0.9% FLUSH
10.0000 mL | Freq: Once | INTRAVENOUS | Status: AC
  Administered 2022-06-18: 10 mL

## 2022-06-18 MED ORDER — HEPARIN SOD (PORK) LOCK FLUSH 100 UNIT/ML IV SOLN
500.0000 [IU] | Freq: Once | INTRAVENOUS | Status: AC
  Administered 2022-06-18: 500 [IU]

## 2022-06-18 NOTE — Assessment & Plan Note (Signed)
She has compromised renal function due to hydronephrosis but overall asymptomatic I do not recommend stent placement long-term 

## 2022-06-18 NOTE — Assessment & Plan Note (Signed)
We will continue B12 injections monthly I plan to monitor her B12 closely, repeat level is slightly better 

## 2022-06-18 NOTE — Progress Notes (Signed)
[facility] OFFICE PROGRESS NOTE  Patient Care Team: Default, Provider, MD as PCP - General  ASSESSMENT & PLAN:  Malignant neoplasm of cervix ([place]) So far, she tolerated single agent gemcitabine well We will observe for anemia We will continue a few more cycles of treatment with plan to repeat imaging study at the end of the year  Vitamin B12 deficiency We will continue B12 injections monthly I plan to monitor her B12 closely, repeat level is slightly better  Hydronephrosis of right kidney She has compromised renal function due to hydronephrosis but overall asymptomatic I do not recommend stent placement long-term  No orders of the defined types were placed in this encounter.   All questions were answered. The patient knows to call the clinic with any problems, questions or concerns. The total time spent in the appointment was 20 minutes encounter with patients including review of chart and various tests results, discussions about plan of care and coordination of care plan   [name], MD 06/18/2022 11:01 AM  INTERVAL HISTORY: Please see below for problem oriented charting. she returns for treatment follow-up seen prior to chemotherapy She tolerated single agent gemcitabine well Denies pelvic pain or recent vaginal bleeding  REVIEW OF SYSTEMS:   Constitutional: Denies fevers, chills or abnormal weight loss Eyes: Denies blurriness of vision Ears, nose, mouth, throat, and face: Denies mucositis or sore throat Respiratory: Denies cough, dyspnea or wheezes Cardiovascular: Denies palpitation, chest discomfort or lower extremity swelling Gastrointestinal:  Denies nausea, heartburn or change in bowel habits Skin: Denies abnormal skin rashes Lymphatics: Denies new lymphadenopathy or easy bruising Neurological:Denies numbness, tingling or new weaknesses Behavioral/Psych: Mood is stable, no new changes  All other systems were reviewed with the patient and are  negative.  I have reviewed the past medical history, past surgical history, social history and family history with the patient and they are unchanged from previous note.  ALLERGIES:  has No Known Allergies.  MEDICATIONS:  Current Outpatient Medications  Medication Sig Dispense Refill   levothyroxine (SYNTHROID) 100 MCG tablet Take 1 tablet (100 mcg total) by mouth daily before breakfast. 30 tablet 0   lidocaine-prilocaine (EMLA) cream Apply to affected area once 30 g 3   Multiple Vitamin (MULTIVITAMIN) tablet Take 1 tablet by mouth daily.     ondansetron (ZOFRAN) 8 MG tablet Take 1 tablet (8 mg total) by mouth 2 (two) times daily as needed (Nausea or vomiting). 30 tablet 1   prochlorperazine (COMPAZINE) 10 MG tablet Take 1 tablet (10 mg total) by mouth every 6 (six) hours as needed (Nausea or vomiting). 30 tablet 1   No current facility-administered medications for this visit.   Facility-Administered Medications Ordered in Other Visits  Medication Dose Route Frequency Provider Last Rate Last Admin   diphenhydrAMINE (BENADRYL) 50 MG/ML injection            famotidine (PEPCID) 20-0.9 MG/50ML-% IVPB             SUMMARY OF ONCOLOGIC HISTORY: Oncology History Overview Note  Cancer Staging Malignant neoplasm of cervix ([place]) Staging form: Cervix Uteri, AJCC 8th Edition - Clinical: Stage IIIB (cT3b, cN1, cM0) - Signed by [name], MD on 05/04/2018  PD-L1 1% Progressed on carboplatin, pembrolizumab, cisplatin and taxol   Malignant neoplasm of cervix ([facility])  03/13/2018 Imaging   US pelvis There are mobile echogenic foci within the endometrial cavity suggestive of gas. This is nonspecific in etiology however may be secondary to endometritis. Correlate for history of recent  instrumentation. This obscures visualization of the endometrial tissue and therefore a follow-up pelvic ultrasound in 2-4 weeks is recommended after resolution of the acute symptomatology if symptoms persist to further  evaluate the endometrium.     03/13/2018 Initial Diagnosis   She initially presented with PMB   04/12/2018 Pathology Results   Cervix, biopsy, mass - INVASIVE SQUAMOUS CELL CARCINOMA - SEE COMMENT   04/12/2018 Surgery   PREOPERATIVE DIAGNOSIS:  Postmenopausal vaginal bleeding. POSTOPERATIVE DIAGNOSIS: The same PROCEDURE: Exam under anesthesia, pap smear, cervical mass biopsy SURGEON:  Dr. [name]   INDICATIONS: 71 y.o. yo G0P0000 with PMB here for exam under anesthesia.Risks of surgery were discussed with the patient including but not limited to: bleeding which may require transfusion; infection which may require antibiotics; injury to uterus or surrounding organs; need for additional procedures including laparotomy or laparoscopy; and other postoperative/anesthesia complications. Written informed consent was obtained.     FINDINGS:  An 8-week size midline uterus.  No adnexal mass palpable on exam. Normal cervix not visualized. Friable mass seen in involving the vagina at the level of the cervix obliterating the anterior and posterior fornix.   ANESTHESIA:   General INTRAVENOUS FLUIDS:  300 ml of LR ESTIMATED BLOOD LOSS: 20 ml. SPECIMENS: pap smear, cervical mass biopsy COMPLICATIONS:  None immediate.     04/25/2018 PET scan   1. Large cervical mass may invade into the myometrium in down into the vagina, maximum SUV 21.3. There is a malignant left external iliac node measuring 1.4 cm in short axis with maximum SUV 14.1. 2. Accentuated activity in the cecum and ascending colon is likely physiologic given that it has no CT correlate. Correlation with the patient's colon cancer screening history is recommended. If screening is not up-to-date, appropriate screening should be considered. 3.  Aortic Atherosclerosis (ICD10-I70.0).     04/28/2018 Surgery   Pre-operative Diagnosis:  At least Stage 2B SCCa Cervical Cancer   Post-operative Diagnosis: At least clinical Stage 3A SCCa  Cervical Cancer   Operation:  Exam under anesthesia due to intolerance for vaginal exam in office Cystoscopy due to concern for extension to posterior bladder (from anterior vaginal wall lesion)   Operative Findings:   Parametrial involvement on right  Right sided subvaginal tumor burden ~3cm. This approximates the right ureteral insertion into the bladder based on my exam during cystoscopy. Extension of disease down upper half of vagina on lateral and posterior walls. Extension of disease down to lower third of anterior vagina (versus skip lesion) thus making her Stage 3A Cystoscopy revealed patent bilateral ureteral orifices and no bladder mucosa invasion or areas of concern. Rectal exam grossly no disease.     05/04/2018 Cancer Staging   Staging form: Cervix Uteri, AJCC 8th Edition - Clinical: Stage IIIB (cT3b, cN1, cM0) - Signed by [name], MD on 05/04/2018   05/12/2018 Procedure   Successful 8 French right internal jugular vein power port placement with its tip at the SVC/RA junction   05/13/2018 - 06/24/2018 Chemotherapy   The patient had weekly cisplatin given with concurrent radiation    05/17/2018 - 07/27/2018 Radiation Therapy   Radiation treatment dates:   05/17/2018-07/27/2018   Site/dose: 1. Cervix, 1.8 Gy in 25 fractions for a total dose of 45 Gy                    2. Pelvis  Boost, 1.8 Gy in 5 fractions for a total dose of 9 Gy  3. Cervix, 5.5 Gy in 5 fractions for a total dose of 27.5 Gy   06/03/2018 Adverse Reaction   She missed her chemo due to severe depression and was sent to the ER for suicidal ideation.  She was seen by psychiatrist.   10/27/2018 PET scan   IMPRESSION: 1. Complete metabolic response to therapy, without residual or recurrent hypermetabolic disease. 2.  Aortic Atherosclerosis (ICD10-I70.0).     11/17/2018 Imaging   Successful right IJ vein Port-A-Cath explant.   03/29/2020 PET scan   1. Hypermetabolic lesion along the right  vaginal cuff is highly worrisome for recurrent cervical cancer. No evidence of distant metastatic disease. 2.  Aortic atherosclerosis (ICD10-I70.0).   04/26/2020 - 12/10/2020 Chemotherapy   The patient had CARBOplatin  for chemotherapy treatment.      Genetic Testing   Patient has genetic testing done for PD-L1. Results revealed patient has the following:  PD-L1 combined positive score (CPS): 1%   07/08/2020 Imaging   1. Unchanged post treatment appearance of the pelvis, including a soft tissue nodule in the low right hemipelvis adjacent to the vagina measuring 2.8 x 1.7 cm, previously FDG avid and consistent with malignancy. 2. No evidence of nodal or distant metastatic disease in the abdomen or pelvis. 3. Aortic Atherosclerosis (ICD10-I70.0).   10/14/2020 Imaging   1. Unchanged right eccentric soft tissue mass in the low right hemipelvis centered on the cervix and vagina, measuring 2.9 x 1.9 cm. 2. New small volume fluid within the endometrial cavity, abnormal in the postmenopausal setting and suggesting obstruction of the cervical os by soft tissue mass. 3. No evidence of lymphadenopathy or distant metastatic disease in the abdomen or pelvis.   Aortic Atherosclerosis (ICD10-I70.0).   12/24/2020 Imaging   1. Abnormal hypodense masslike appearance continuous with hypodense masslike lesion probably centrally within a right eccentric cervix (versus right paracervical mass). The endometrial portion has enlarged compared to 10/14/2020 and active malignancy is suspected. 2. No adenopathy or findings of distant spread. 3. Other imaging findings of potential clinical significance: Aortic Atherosclerosis (ICD10-I70.0). Mild cardiomegaly. Stable scarring in the left lower lobe. Sigmoid colon scattered diverticula. Tarlov cysts at the S2 level. Vertical sclerosis in the sacral ala possibly due to stress fractures or prior radiation therapy.     01/06/2021 - 06/19/2021 Chemotherapy   Patient is on  Treatment Plan : UTERINE Pembrolizumab q21d      04/07/2021 Imaging   1. The previously hypermetabolic right vaginal mass currently measures 12 cubic cm in volume, previously 11 cubic cm on 12/23/2020. 2. No new adenopathy or new findings of distant metastatic spread. 3. New wall thickening in several loops of distal ileum suspicious for inflammation/enteritis. 4. Other imaging findings of potential clinical significance: Mild cardiomegaly. Mild sigmoid colon diverticulosis. Stable bony demineralization with vertical sclerosis in the sacral ala likely from old insufficiency fracture.   07/10/2021 Imaging   1. New moderate right-sided hydroureteronephrosis with decreased perfusion to the right kidney compatible with obstructive uropathy.  2. Interval progression of the heterogeneous low-density uterine lesion, with progression of enhancing soft tissue extending inferiorly to the right, compatible with previously described disease in the right vaginal wall. Represents the site of right ureteral obstruction. 3. No evidence for new metastatic disease in the abdomen or pelvis. 4. Bilateral sacral insufficiency fractures. 5. Trace free fluid in the pelvis. 6. Aortic Atherosclerosis (ICD10-I70.0).     07/22/2021 Procedure   Successful placement of a right internal jugular approach power injectable Port-A-Cath.     07/24/2021 -  10/02/2021 Chemotherapy   Patient is on Treatment Plan : cervical cancer Cisplatin q7d       10/16/2021 Imaging   1. Mild interval increase in size of heterogeneous tumor arising from within the uterus with local tumor extension into the right pelvic sidewall. 2. Persistent right-sided obstructive uropathy with severe right hydronephrosis and progressive right renal cortical volume loss. Right hydroureter extends into the pelvis where it is obstructed by the uterine/cervical mass. 3. Bilateral sacral insufficiency fractures. 4. Aortic Atherosclerosis (ICD10-I70.0).    11/06/2021 - 05/15/2022 Chemotherapy   Patient is on Treatment Plan : Cervical Paclitaxel q7d     02/10/2022 Imaging   1. Slight interval decrease in size of a heterogeneous mass within the right pelvis, involving the adjacent right aspect of the uterus and closely abutting the posterior urinary bladder,, consistent with treatment response. Fat stranding and presacral soft tissue thickening in the adjacent pelvis, unchanged. 2. Unchanged severe right hydronephrosis and hydroureter, the distal right ureter obstructed by mass in the pelvis, again with slight progression of right renal cortical atrophy. 3. Unchanged, mildly sclerotic nondisplaced bilateral sacral insufficiency fractures.   Aortic Atherosclerosis (ICD10-I70.0).   05/28/2022 Imaging   1. Slight interval increase in size of the lower uterine/cervical mass. 2. Persistent severe right-sided hydroureteronephrosis down to the level of the uterine/cervical mass. 3. Stable right-sided posterior bladder wall thickening. 4. No findings for metastatic disease involving the abdomen/pelvis.   Aortic Atherosclerosis (ICD10-I70.0).   05/29/2022 - 05/29/2022 Chemotherapy   Patient is on Treatment Plan : OVARIAN Paclitaxel (80) Q6,7,61,95 q28d     06/12/2022 -  Chemotherapy   Patient is on Treatment Plan : Cervix Gemcitabine D1,8 (1000) q21d       PHYSICAL EXAMINATION: ECOG PERFORMANCE STATUS: 1 - Symptomatic but completely ambulatory  Vitals:   06/18/22 1018  BP: 130/81  Pulse: 75  Resp: 18  Temp: 97.7 F (36.5 C)  SpO2: 99%   Filed Weights   06/18/22 1018  Weight: 93 lb (42.2 kg)    GENERAL:alert, no distress and comfortable NEURO: alert & oriented x 3 with fluent speech, no focal motor/sensory deficits  LABORATORY DATA:  I have reviewed the data as listed    Component Value Date/Time   NA 139 06/18/2022 1002   K 4.2 06/18/2022 1002   CL 106 06/18/2022 1002   CO2 28 06/18/2022 1002   GLUCOSE 94 06/18/2022 1002   BUN 20  06/18/2022 1002   CREATININE 1.27 (H) 06/18/2022 1002   CALCIUM 8.7 (L) 06/18/2022 1002   PROT 7.2 06/18/2022 1002   ALBUMIN 4.1 06/18/2022 1002   AST 17 06/18/2022 1002   ALT 8 06/18/2022 1002   ALKPHOS 46 06/18/2022 1002   BILITOT 0.3 06/18/2022 1002   GFRNONAA 45 (L) 06/18/2022 1002   GFRAA >60 06/14/2020 0844   GFRAA >60 03/11/2020 0951    No results found for: "SPEP", "UPEP"  Lab Results  Component Value Date   WBC 4.7 06/18/2022   NEUTROABS 3.6 06/18/2022   HGB 9.8 (L) 06/18/2022   HCT 30.2 (L) 06/18/2022   MCV 91.2 06/18/2022   PLT 140 (L) 06/18/2022      Chemistry      Component Value Date/Time   NA 139 06/18/2022 1002   K 4.2 06/18/2022 1002   CL 106 06/18/2022 1002   CO2 28 06/18/2022 1002   BUN 20 06/18/2022 1002   CREATININE 1.27 (H) 06/18/2022 1002      Component Value Date/Time   CALCIUM 8.7 (L)  06/18/2022 1002   ALKPHOS 46 06/18/2022 1002   AST 17 06/18/2022 1002   ALT 8 06/18/2022 1002   BILITOT 0.3 06/18/2022 1002

## 2022-06-18 NOTE — Assessment & Plan Note (Signed)
So far, she tolerated single agent gemcitabine well We will observe for anemia We will continue a few more cycles of treatment with plan to repeat imaging study at the end of the year

## 2022-06-19 ENCOUNTER — Inpatient Hospital Stay: Payer: Medicare Other | Admitting: Hematology and Oncology

## 2022-06-19 ENCOUNTER — Other Ambulatory Visit: Payer: Self-pay

## 2022-06-19 ENCOUNTER — Inpatient Hospital Stay: Payer: Medicare Other

## 2022-06-19 ENCOUNTER — Encounter (HOSPITAL_COMMUNITY): Payer: Self-pay

## 2022-06-19 ENCOUNTER — Ambulatory Visit (HOSPITAL_COMMUNITY): Payer: Medicare Other

## 2022-06-19 ENCOUNTER — Other Ambulatory Visit: Payer: Self-pay | Admitting: Hematology and Oncology

## 2022-06-19 VITALS — BP 122/75 | HR 71 | Temp 98.9°F | Resp 12

## 2022-06-19 DIAGNOSIS — C539 Malignant neoplasm of cervix uteri, unspecified: Secondary | ICD-10-CM

## 2022-06-19 DIAGNOSIS — Z7189 Other specified counseling: Secondary | ICD-10-CM

## 2022-06-19 MED ORDER — SODIUM CHLORIDE 0.9 % IV SOLN: Freq: Once | INTRAVENOUS | Status: AC

## 2022-06-19 MED ORDER — PROCHLORPERAZINE MALEATE 10 MG PO TABS
10.0000 mg | ORAL_TABLET | Freq: Once | ORAL | Status: AC
  Administered 2022-06-19: 10 mg via ORAL
  Filled 2022-06-19: qty 1

## 2022-06-19 MED ORDER — SODIUM CHLORIDE 0.9 % IV SOLN
1000.0000 mg/m2 | Freq: Once | INTRAVENOUS | Status: AC
  Administered 2022-06-19: 1368 mg via INTRAVENOUS
  Filled 2022-06-19: qty 35.98

## 2022-06-19 MED ORDER — SODIUM CHLORIDE 0.9% FLUSH
10.0000 mL | INTRAVENOUS | Status: DC | PRN
  Administered 2022-06-19: 10 mL

## 2022-06-19 MED ORDER — HEPARIN SOD (PORK) LOCK FLUSH 100 UNIT/ML IV SOLN
500.0000 [IU] | Freq: Once | INTRAVENOUS | Status: AC | PRN
  Administered 2022-06-19: 500 [IU]

## 2022-06-19 NOTE — Patient Instructions (Signed)
[facility] MEDICAL [facility]COLOG[facility]  Discharge Instructions: Thank you for choosing [facility] to provide your oncology and hematology care.   If you have a lab appointment with the Cancer Center, please go directly to the Cancer Center and check in at the registration area.   Wear comfortable clothing and clothing appropriate for easy access to any Portacath or PICC line.   We strive to give you quality time with your provider. You may need to reschedule your appointment if you arrive late (15 or more minutes).  Arriving late affects you and other patients whose appointments are after yours.  Also, if you miss three or more appointments without notifying the office, you may be dismissed from the clinic at the provider's discretion.      For prescription refill requests, have your pharmacy contact our office and allow 72 hours for refills to be completed.    Today you received the following chemotherapy and/or immunotherapy agents: Gemcitabine.       To help prevent nausea and vomiting after your treatment, we encourage you to take your nausea medication as directed.  BELOW ARE SYMPTOMS THAT SHOULD BE REPORTED IMMEDIATELY: *FEVER GREATER THAN 100.4 F (38 C) OR HIGHER *CHILLS OR SWEATING *NAUSEA AND VOMITING THAT IS NOT CONTROLLED WITH YOUR NAUSEA MEDICATION *UNUSUAL SHORTNESS OF BREATH *UNUSUAL BRUISING OR BLEEDING *URINARY PROBLEMS (pain or burning when urinating, or frequent urination) *BOWEL PROBLEMS (unusual diarrhea, constipation, pain near the anus) TENDERNESS IN MOUTH AND THROAT WITH OR WITHOUT PRESENCE OF ULCERS (sore throat, sores in mouth, or a toothache) UNUSUAL RASH, SWELLING OR PAIN  UNUSUAL VAGINAL DISCHARGE OR ITCHING   Items with * indicate a potential emergency and should be followed up as soon as possible or go to the Emergency Department if any problems should occur.  Please show the CHEMOTHERAPY ALERT CARD or IMMUNOTHERAPY ALERT CARD at check-in  to the Emergency Department and triage nurse.  Should you have questions after your visit or need to cancel or reschedule your appointment, please contact [facility]  Dept: [phone]  and follow the prompts.  Office hours are 8:00 a.m. to 4:30 p.m. Monday - Friday. Please note that voicemails left after 4:00 p.m. may not be returned until the following business day.  We are closed weekends and major holidays. You have access to a nurse at all times for urgent questions. Please call the main number to the clinic Dept: [phone] and follow the prompts.   For any non-urgent questions, you may also contact your provider using MyChart. We now offer e-Visits for anyone 18 and older to request care online for non-urgent symptoms. For details visit [email or web address].   Also download the MyChart app! Go to the app store, search "MyChart", open the app, select [facility], and log in with your MyChart username and password.  Masks are optional in the cancer centers. If you would like for your care team to wear a mask while they are taking care of you, please let them know. You may have one support person who is at least 70 years old accompany you for your appointments. 

## 2022-06-21 ENCOUNTER — Other Ambulatory Visit: Payer: Self-pay

## 2022-07-03 ENCOUNTER — Other Ambulatory Visit: Payer: Self-pay

## 2022-07-03 ENCOUNTER — Inpatient Hospital Stay: Payer: Medicare Other

## 2022-07-03 VITALS — BP 121/71 | HR 80 | Temp 98.6°F | Resp 17 | Wt 93.2 lb

## 2022-07-03 DIAGNOSIS — E538 Deficiency of other specified B group vitamins: Secondary | ICD-10-CM

## 2022-07-03 DIAGNOSIS — Z7189 Other specified counseling: Secondary | ICD-10-CM

## 2022-07-03 DIAGNOSIS — D61818 Other pancytopenia: Secondary | ICD-10-CM

## 2022-07-03 DIAGNOSIS — C539 Malignant neoplasm of cervix uteri, unspecified: Secondary | ICD-10-CM | POA: Diagnosis not present

## 2022-07-03 LAB — CBC WITH DIFFERENTIAL (CANCER CENTER ONLY)
Abs Immature Granulocytes: 0.03 10*3/uL (ref 0.00–0.07)
Basophils Absolute: 0 10*3/uL (ref 0.0–0.1)
Basophils Relative: 1 %
Eosinophils Absolute: 0.1 10*3/uL (ref 0.0–0.5)
Eosinophils Relative: 2 %
HCT: 29.1 % — ABNORMAL LOW (ref 36.0–46.0)
Hemoglobin: 9.4 g/dL — ABNORMAL LOW (ref 12.0–15.0)
Immature Granulocytes: 1 %
Lymphocytes Relative: 16 %
Lymphs Abs: 0.7 10*3/uL (ref 0.7–4.0)
MCH: 29.9 pg (ref 26.0–34.0)
MCHC: 32.3 g/dL (ref 30.0–36.0)
MCV: 92.7 fL (ref 80.0–100.0)
Monocytes Absolute: 0.6 10*3/uL (ref 0.1–1.0)
Monocytes Relative: 14 %
Neutro Abs: 3 10*3/uL (ref 1.7–7.7)
Neutrophils Relative %: 66 %
Platelet Count: 325 10*3/uL (ref 150–400)
RBC: 3.14 MIL/uL — ABNORMAL LOW (ref 3.87–5.11)
RDW: 15 % (ref 11.5–15.5)
WBC Count: 4.5 10*3/uL (ref 4.0–10.5)
nRBC: 0 % (ref 0.0–0.2)

## 2022-07-03 LAB — CMP (CANCER CENTER ONLY)
ALT: 6 U/L (ref 0–44)
AST: 15 U/L (ref 15–41)
Albumin: 3.9 g/dL (ref 3.5–5.0)
Alkaline Phosphatase: 47 U/L (ref 38–126)
Anion gap: 6 (ref 5–15)
BUN: 18 mg/dL (ref 8–23)
CO2: 27 mmol/L (ref 22–32)
Calcium: 8.7 mg/dL — ABNORMAL LOW (ref 8.9–10.3)
Chloride: 107 mmol/L (ref 98–111)
Creatinine: 1.11 mg/dL — ABNORMAL HIGH (ref 0.44–1.00)
GFR, Estimated: 53 mL/min — ABNORMAL LOW (ref >60)
Glucose, Bld: 90 mg/dL (ref 70–99)
Potassium: 4 mmol/L (ref 3.5–5.1)
Sodium: 140 mmol/L (ref 135–145)
Total Bilirubin: 0.3 mg/dL (ref 0.3–1.2)
Total Protein: 6.7 g/dL (ref 6.5–8.1)

## 2022-07-03 MED ORDER — SODIUM CHLORIDE 0.9 % IV SOLN: Freq: Once | INTRAVENOUS | Status: AC

## 2022-07-03 MED ORDER — PROCHLORPERAZINE MALEATE 10 MG PO TABS
10.0000 mg | ORAL_TABLET | Freq: Once | ORAL | Status: AC
  Administered 2022-07-03: 10 mg via ORAL
  Filled 2022-07-03: qty 1

## 2022-07-03 MED ORDER — SODIUM CHLORIDE 0.9% FLUSH
10.0000 mL | INTRAVENOUS | Status: DC | PRN
  Administered 2022-07-03: 10 mL

## 2022-07-03 MED ORDER — SODIUM CHLORIDE 0.9% FLUSH
10.0000 mL | Freq: Once | INTRAVENOUS | Status: AC
  Administered 2022-07-03: 10 mL

## 2022-07-03 MED ORDER — SODIUM CHLORIDE 0.9 % IV SOLN
1000.0000 mg/m2 | Freq: Once | INTRAVENOUS | Status: AC
  Administered 2022-07-03: 1368 mg via INTRAVENOUS
  Filled 2022-07-03: qty 35.98

## 2022-07-03 MED ORDER — HEPARIN SOD (PORK) LOCK FLUSH 100 UNIT/ML IV SOLN
500.0000 [IU] | Freq: Once | INTRAVENOUS | Status: AC | PRN
  Administered 2022-07-03: 500 [IU]

## 2022-07-03 NOTE — Patient Instructions (Signed)
[facility]  Discharge Instructions: Thank you for choosing [facility] to provide your oncology and hematology care.   If you have a lab appointment with the Cancer Center, please go directly to the Cancer Center and check in at the registration area.   Wear comfortable clothing and clothing appropriate for easy access to any Portacath or PICC line.   We strive to give you quality time with your provider. You may need to reschedule your appointment if you arrive late (15 or more minutes).  Arriving late affects you and other patients whose appointments are after yours.  Also, if you miss three or more appointments without notifying the office, you may be dismissed from the clinic at the provider's discretion.      For prescription refill requests, have your pharmacy contact our office and allow 72 hours for refills to be completed.    Today you received the following chemotherapy and/or immunotherapy agents: Gemcitabine.       To help prevent nausea and vomiting after your treatment, we encourage you to take your nausea medication as directed.  BELOW ARE SYMPTOMS THAT SHOULD BE REPORTED IMMEDIATELY: *FEVER GREATER THAN 100.4 F (38 C) OR HIGHER *CHILLS OR SWEATING *NAUSEA AND VOMITING THAT IS NOT CONTROLLED WITH YOUR NAUSEA MEDICATION *UNUSUAL SHORTNESS OF BREATH *UNUSUAL BRUISING OR BLEEDING *URINARY PROBLEMS (pain or burning when urinating, or frequent urination) *BOWEL PROBLEMS (unusual diarrhea, constipation, pain near the anus) TENDERNESS IN MOUTH AND THROAT WITH OR WITHOUT PRESENCE OF ULCERS (sore throat, sores in mouth, or a toothache) UNUSUAL RASH, SWELLING OR PAIN  UNUSUAL VAGINAL DISCHARGE OR ITCHING   Items with * indicate a potential emergency and should be followed up as soon as possible or go to the Emergency Department if any problems should occur.  Please show the CHEMOTHERAPY ALERT CARD or IMMUNOTHERAPY ALERT CARD at check-in  to the Emergency Department and triage nurse.  Should you have questions after your visit or need to cancel or reschedule your appointment, please contact [facility]  Dept: [phone]  and follow the prompts.  Office hours are 8:00 a.m. to 4:30 p.m. Monday - Friday. Please note that voicemails left after 4:00 p.m. may not be returned until the following business day.  We are closed weekends and major holidays. You have access to a nurse at all times for urgent questions. Please call the main number to the clinic Dept: [phone] and follow the prompts.   For any non-urgent questions, you may also contact your provider using MyChart. We now offer e-Visits for anyone 18 and older to request care online for non-urgent symptoms. For details visit [email or web address].   Also download the MyChart app! Go to the app store, search "MyChart", open the app, select , and log in with your MyChart username and password.  Masks are optional in the cancer centers. If you would like for your care team to wear a mask while they are taking care of you, please let them know. You may have one support person who is at least 70 years old accompany you for your appointments. 

## 2022-07-09 ENCOUNTER — Inpatient Hospital Stay: Payer: Medicare Other

## 2022-07-09 ENCOUNTER — Other Ambulatory Visit: Payer: Self-pay

## 2022-07-09 ENCOUNTER — Encounter: Payer: Self-pay | Admitting: Hematology and Oncology

## 2022-07-09 ENCOUNTER — Inpatient Hospital Stay (HOSPITAL_BASED_OUTPATIENT_CLINIC_OR_DEPARTMENT_OTHER): Payer: Medicare Other | Admitting: Hematology and Oncology

## 2022-07-09 VITALS — BP 109/71 | HR 79 | Temp 97.7°F | Resp 18 | Ht 62.0 in | Wt 91.2 lb

## 2022-07-09 DIAGNOSIS — Z7189 Other specified counseling: Secondary | ICD-10-CM

## 2022-07-09 DIAGNOSIS — C539 Malignant neoplasm of cervix uteri, unspecified: Secondary | ICD-10-CM

## 2022-07-09 DIAGNOSIS — E538 Deficiency of other specified B group vitamins: Secondary | ICD-10-CM | POA: Diagnosis not present

## 2022-07-09 DIAGNOSIS — R64 Cachexia: Secondary | ICD-10-CM

## 2022-07-09 DIAGNOSIS — N1330 Unspecified hydronephrosis: Secondary | ICD-10-CM

## 2022-07-09 DIAGNOSIS — D61818 Other pancytopenia: Secondary | ICD-10-CM | POA: Diagnosis not present

## 2022-07-09 DIAGNOSIS — E039 Hypothyroidism, unspecified: Secondary | ICD-10-CM

## 2022-07-09 LAB — CBC WITH DIFFERENTIAL (CANCER CENTER ONLY)
Abs Immature Granulocytes: 0.04 10*3/uL (ref 0.00–0.07)
Basophils Absolute: 0 10*3/uL (ref 0.0–0.1)
Basophils Relative: 1 %
Eosinophils Absolute: 0 10*3/uL (ref 0.0–0.5)
Eosinophils Relative: 2 %
HCT: 28.9 % — ABNORMAL LOW (ref 36.0–46.0)
Hemoglobin: 9.7 g/dL — ABNORMAL LOW (ref 12.0–15.0)
Immature Granulocytes: 2 %
Lymphocytes Relative: 34 %
Lymphs Abs: 0.7 10*3/uL (ref 0.7–4.0)
MCH: 30.4 pg (ref 26.0–34.0)
MCHC: 33.6 g/dL (ref 30.0–36.0)
MCV: 90.6 fL (ref 80.0–100.0)
Monocytes Absolute: 0.2 10*3/uL (ref 0.1–1.0)
Monocytes Relative: 11 %
Neutro Abs: 1.1 10*3/uL — ABNORMAL LOW (ref 1.7–7.7)
Neutrophils Relative %: 50 %
Platelet Count: 362 10*3/uL (ref 150–400)
RBC: 3.19 MIL/uL — ABNORMAL LOW (ref 3.87–5.11)
RDW: 14.7 % (ref 11.5–15.5)
WBC Count: 2.2 10*3/uL — ABNORMAL LOW (ref 4.0–10.5)
nRBC: 0 % (ref 0.0–0.2)

## 2022-07-09 LAB — CMP (CANCER CENTER ONLY)
ALT: 8 U/L (ref 0–44)
AST: 17 U/L (ref 15–41)
Albumin: 4.1 g/dL (ref 3.5–5.0)
Alkaline Phosphatase: 50 U/L (ref 38–126)
Anion gap: 6 (ref 5–15)
BUN: 17 mg/dL (ref 8–23)
CO2: 30 mmol/L (ref 22–32)
Calcium: 9.4 mg/dL (ref 8.9–10.3)
Chloride: 103 mmol/L (ref 98–111)
Creatinine: 1.16 mg/dL — ABNORMAL HIGH (ref 0.44–1.00)
GFR, Estimated: 51 mL/min — ABNORMAL LOW (ref >60)
Glucose, Bld: 84 mg/dL (ref 70–99)
Potassium: 4 mmol/L (ref 3.5–5.1)
Sodium: 139 mmol/L (ref 135–145)
Total Bilirubin: 0.3 mg/dL (ref 0.3–1.2)
Total Protein: 7.2 g/dL (ref 6.5–8.1)

## 2022-07-09 LAB — TSH: TSH: 2.638 u[IU]/mL (ref 0.350–4.500)

## 2022-07-09 MED ORDER — SODIUM CHLORIDE 0.9 % IV SOLN: Freq: Once | INTRAVENOUS | Status: DC

## 2022-07-09 MED ORDER — SODIUM CHLORIDE 0.9 % IV SOLN: Freq: Once | INTRAVENOUS | Status: AC

## 2022-07-09 MED ORDER — PROCHLORPERAZINE MALEATE 10 MG PO TABS
10.0000 mg | ORAL_TABLET | Freq: Once | ORAL | Status: AC
  Administered 2022-07-09: 10 mg via ORAL
  Filled 2022-07-09: qty 1

## 2022-07-09 MED ORDER — SODIUM CHLORIDE 0.9% FLUSH
10.0000 mL | Freq: Once | INTRAVENOUS | Status: AC
  Administered 2022-07-09: 10 mL

## 2022-07-09 MED ORDER — SODIUM CHLORIDE 0.9 % IV SOLN
800.0000 mg/m2 | Freq: Once | INTRAVENOUS | Status: AC
  Administered 2022-07-09: 1064 mg via INTRAVENOUS
  Filled 2022-07-09: qty 27.98

## 2022-07-09 MED ORDER — SODIUM CHLORIDE 0.9% FLUSH
10.0000 mL | INTRAVENOUS | Status: DC | PRN
  Administered 2022-07-09: 10 mL

## 2022-07-09 MED ORDER — HEPARIN SOD (PORK) LOCK FLUSH 100 UNIT/ML IV SOLN
500.0000 [IU] | Freq: Once | INTRAVENOUS | Status: AC | PRN
  Administered 2022-07-09: 500 [IU]

## 2022-07-09 NOTE — Assessment & Plan Note (Signed)
She has lost some weight but she felt that her appetite is improving We discussed importance of keeping her weight up while on treatment

## 2022-07-09 NOTE — Assessment & Plan Note (Signed)
She has worsening pancytopenia due to side effects of treatment Recommend dose reduction She does not need transfusion support

## 2022-07-09 NOTE — Progress Notes (Signed)
[facility] OFFICE PROGRESS NOTE  Patient Care Team: Default, Provider, MD as PCP - General  ASSESSMENT & PLAN:  Malignant neoplasm of cervix ([place]) She tolerated treatment poorly She has lost weight and she has developed progressive pancytopenia We discussed dose reduction She would like to proceed with treatment without delay I will reduce dose of chemo by 20% I recommend we proceed with treatment with plan to repeat imaging study at the end of the year  Pancytopenia, acquired [facility]) She has worsening pancytopenia due to side effects of treatment Recommend dose reduction She does not need transfusion support  Vitamin B12 deficiency We will continue B12 injections monthly I plan to monitor her B12 closely, repeat level is slightly better  Cachexia ([place]) She has lost some weight but she felt that her appetite is improving We discussed importance of keeping her weight up while on treatment  Hydronephrosis of right kidney She has compromised renal function due to hydronephrosis but overall asymptomatic I do not recommend stent placement long-term  No orders of the defined types were placed in this encounter.   All questions were answered. The patient knows to call the clinic with any problems, questions or concerns. The total time spent in the appointment was 40 minutes encounter with patients including review of chart and various tests results, discussions about plan of care and coordination of care plan   [name], MD 07/09/2022 11:55 AM  INTERVAL HISTORY: Please see below for problem oriented charting. she returns for treatment follow-up seen prior to day 8 of gemcitabine chemotherapy She has lost some weight but she felt that she is eating better She denies pelvic pain or bleeding No recent infection, fever or chills Denies nausea from treatment  REVIEW OF SYSTEMS:   Constitutional: Denies fevers, chills or abnormal weight loss Eyes: Denies  blurriness of vision Ears, nose, mouth, throat, and face: Denies mucositis or sore throat Respiratory: Denies cough, dyspnea or wheezes Cardiovascular: Denies palpitation, chest discomfort or lower extremity swelling Gastrointestinal:  Denies nausea, heartburn or change in bowel habits Skin: Denies abnormal skin rashes Lymphatics: Denies new lymphadenopathy or easy bruising Neurological:Denies numbness, tingling or new weaknesses Behavioral/Psych: Mood is stable, no new changes  All other systems were reviewed with the patient and are negative.  I have reviewed the past medical history, past surgical history, social history and family history with the patient and they are unchanged from previous note.  ALLERGIES:  has No Known Allergies.  MEDICATIONS:  Current Outpatient Medications  Medication Sig Dispense Refill   levothyroxine (SYNTHROID) 100 MCG tablet Take 1 tablet (100 mcg total) by mouth daily before breakfast. 30 tablet 0   lidocaine-prilocaine (EMLA) cream Apply to affected area once 30 g 3   Multiple Vitamin (MULTIVITAMIN) tablet Take 1 tablet by mouth daily.     ondansetron (ZOFRAN) 8 MG tablet Take 1 tablet (8 mg total) by mouth 2 (two) times daily as needed (Nausea or vomiting). 30 tablet 1   prochlorperazine (COMPAZINE) 10 MG tablet Take 1 tablet (10 mg total) by mouth every 6 (six) hours as needed (Nausea or vomiting). 30 tablet 1   No current facility-administered medications for this visit.   Facility-Administered Medications Ordered in Other Visits  Medication Dose Route Frequency Provider Last Rate Last Admin   0.9 %  sodium chloride infusion   Intravenous Once Alvy Bimler, Quantisha Marsicano, MD       diphenhydrAMINE (BENADRYL) 50 MG/ML injection  famotidine (PEPCID) 20-0.9 MG/50ML-% IVPB            gemcitabine (GEMZAR) 1,064 mg in sodium chloride 0.9 % 250 mL chemo infusion  800 mg/m2 (Treatment Plan Recorded) Intravenous Once Alvy Bimler, Tate Jerkins, MD 556 mL/hr at 07/09/22 1126 1,064  mg at 07/09/22 1126   heparin lock flush 100 unit/mL  500 Units Intracatheter Once PRN Alvy Bimler, Bea Duren, MD       sodium chloride flush (NS) 0.9 % injection 10 mL  10 mL Intracatheter PRN [name], MD        SUMMARY OF ONCOLOGIC HISTORY: Oncology History Overview Note  Cancer Staging Malignant neoplasm of cervix ([place]) Staging form: Cervix Uteri, AJCC 8th Edition - Clinical: Stage IIIB (cT3b, cN1, cM0) - Signed by [name], MD on 05/04/2018  PD-L1 1% Progressed on carboplatin, pembrolizumab, cisplatin and taxol   Malignant neoplasm of cervix ([place])  03/13/2018 Imaging   US pelvis There are mobile echogenic foci within the endometrial cavity suggestive of gas. This is nonspecific in etiology however may be secondary to endometritis. Correlate for history of recent instrumentation. This obscures visualization of the endometrial tissue and therefore a follow-up pelvic ultrasound in 2-4 weeks is recommended after resolution of the acute symptomatology if symptoms persist to further evaluate the endometrium.     03/13/2018 Initial Diagnosis   She initially presented with PMB   04/12/2018 Pathology Results   Cervix, biopsy, mass - INVASIVE SQUAMOUS CELL CARCINOMA - SEE COMMENT   04/12/2018 Surgery   PREOPERATIVE DIAGNOSIS:  Postmenopausal vaginal bleeding. POSTOPERATIVE DIAGNOSIS: The same PROCEDURE: Exam under anesthesia, pap smear, cervical mass biopsy SURGEON:  Dr. [name]   INDICATIONS: 71 y.o. yo [id number] with PMB here for exam under anesthesia.Risks of surgery were discussed with the patient including but not limited to: bleeding which may require transfusion; infection which may require antibiotics; injury to uterus or surrounding organs; need for additional procedures including laparotomy or laparoscopy; and other postoperative/anesthesia complications. Written informed consent was obtained.     FINDINGS:  An 8-week size midline uterus.  No adnexal mass palpable on exam. Normal  cervix not visualized. Friable mass seen in involving the vagina at the level of the cervix obliterating the anterior and posterior fornix.   ANESTHESIA:   General INTRAVENOUS FLUIDS:  300 ml of LR ESTIMATED BLOOD LOSS: 20 ml. SPECIMENS: pap smear, cervical mass biopsy COMPLICATIONS:  None immediate.     04/25/2018 PET scan   1. Large cervical mass may invade into the myometrium in down into the vagina, maximum SUV 21.3. There is a malignant left external iliac node measuring 1.4 cm in short axis with maximum SUV 14.1. 2. Accentuated activity in the cecum and ascending colon is likely physiologic given that it has no CT correlate. Correlation with the patient's colon cancer screening history is recommended. If screening is not up-to-date, appropriate screening should be considered. 3.  Aortic Atherosclerosis (ICD10-I70.0).     04/28/2018 Surgery   Pre-operative Diagnosis:  At least Stage 2B SCCa Cervical Cancer   Post-operative Diagnosis: At least clinical Stage 3A SCCa Cervical Cancer   Operation:  Exam under anesthesia due to intolerance for vaginal exam in office Cystoscopy due to concern for extension to posterior bladder (from anterior vaginal wall lesion)   Operative Findings:   Parametrial involvement on right  Right sided subvaginal tumor burden ~3cm. This approximates the right ureteral insertion into the bladder based on my exam during cystoscopy. Extension of disease down upper half of vagina on lateral  and posterior walls. Extension of disease down to lower third of anterior vagina (versus skip lesion) thus making her Stage 3A Cystoscopy revealed patent bilateral ureteral orifices and no bladder mucosa invasion or areas of concern. Rectal exam grossly no disease.     05/04/2018 Cancer Staging   Staging form: Cervix Uteri, AJCC 8th Edition - Clinical: Stage IIIB (cT3b, cN1, cM0) - Signed by [name], MD on 05/04/2018   05/12/2018 Procedure   Successful 8 French right  internal jugular vein power port placement with its tip at the SVC/RA junction   05/13/2018 - 06/24/2018 Chemotherapy   The patient had weekly cisplatin given with concurrent radiation    05/17/2018 - 07/27/2018 Radiation Therapy   Radiation treatment dates:   05/17/2018-07/27/2018   Site/dose: 1. Cervix, 1.8 Gy in 25 fractions for a total dose of 45 Gy                    2. Pelvis  Boost, 1.8 Gy in 5 fractions for a total dose of 9 Gy                    3. Cervix, 5.5 Gy in 5 fractions for a total dose of 27.5 Gy   06/03/2018 Adverse Reaction   She missed her chemo due to severe depression and was sent to the ER for suicidal ideation.  She was seen by psychiatrist.   10/27/2018 PET scan   IMPRESSION: 1. Complete metabolic response to therapy, without residual or recurrent hypermetabolic disease. 2.  Aortic Atherosclerosis (ICD10-I70.0).     11/17/2018 Imaging   Successful right IJ vein Port-A-Cath explant.   03/29/2020 PET scan   1. Hypermetabolic lesion along the right vaginal cuff is highly worrisome for recurrent cervical cancer. No evidence of distant metastatic disease. 2.  Aortic atherosclerosis (ICD10-I70.0).   04/26/2020 - 12/10/2020 Chemotherapy   The patient had CARBOplatin  for chemotherapy treatment.      Genetic Testing   Patient has genetic testing done for PD-L1. Results revealed patient has the following:  PD-L1 combined positive score (CPS): 1%   07/08/2020 Imaging   1. Unchanged post treatment appearance of the pelvis, including a soft tissue nodule in the low right hemipelvis adjacent to the vagina measuring 2.8 x 1.7 cm, previously FDG avid and consistent with malignancy. 2. No evidence of nodal or distant metastatic disease in the abdomen or pelvis. 3. Aortic Atherosclerosis (ICD10-I70.0).   10/14/2020 Imaging   1. Unchanged right eccentric soft tissue mass in the low right hemipelvis centered on the cervix and vagina, measuring 2.9 x 1.9 cm. 2. New small volume  fluid within the endometrial cavity, abnormal in the postmenopausal setting and suggesting obstruction of the cervical os by soft tissue mass. 3. No evidence of lymphadenopathy or distant metastatic disease in the abdomen or pelvis.   Aortic Atherosclerosis (ICD10-I70.0).   12/24/2020 Imaging   1. Abnormal hypodense masslike appearance continuous with hypodense masslike lesion probably centrally within a right eccentric cervix (versus right paracervical mass). The endometrial portion has enlarged compared to 10/14/2020 and active malignancy is suspected. 2. No adenopathy or findings of distant spread. 3. Other imaging findings of potential clinical significance: Aortic Atherosclerosis (ICD10-I70.0). Mild cardiomegaly. Stable scarring in the left lower lobe. Sigmoid colon scattered diverticula. Tarlov cysts at the S2 level. Vertical sclerosis in the sacral ala possibly due to stress fractures or prior radiation therapy.     01/06/2021 - 06/19/2021 Chemotherapy   Patient is on Treatment  Plan : UTERINE Pembrolizumab q21d      04/07/2021 Imaging   1. The previously hypermetabolic right vaginal mass currently measures 12 cubic cm in volume, previously 11 cubic cm on 12/23/2020. 2. No new adenopathy or new findings of distant metastatic spread. 3. New wall thickening in several loops of distal ileum suspicious for inflammation/enteritis. 4. Other imaging findings of potential clinical significance: Mild cardiomegaly. Mild sigmoid colon diverticulosis. Stable bony demineralization with vertical sclerosis in the sacral ala likely from old insufficiency fracture.   07/10/2021 Imaging   1. New moderate right-sided hydroureteronephrosis with decreased perfusion to the right kidney compatible with obstructive uropathy.  2. Interval progression of the heterogeneous low-density uterine lesion, with progression of enhancing soft tissue extending inferiorly to the right, compatible with previously described  disease in the right vaginal wall. Represents the site of right ureteral obstruction. 3. No evidence for new metastatic disease in the abdomen or pelvis. 4. Bilateral sacral insufficiency fractures. 5. Trace free fluid in the pelvis. 6. Aortic Atherosclerosis (ICD10-I70.0).     07/22/2021 Procedure   Successful placement of a right internal jugular approach power injectable Port-A-Cath.     07/24/2021 - 10/02/2021 Chemotherapy   Patient is on Treatment Plan : cervical cancer Cisplatin q7d       10/16/2021 Imaging   1. Mild interval increase in size of heterogeneous tumor arising from within the uterus with local tumor extension into the right pelvic sidewall. 2. Persistent right-sided obstructive uropathy with severe right hydronephrosis and progressive right renal cortical volume loss. Right hydroureter extends into the pelvis where it is obstructed by the uterine/cervical mass. 3. Bilateral sacral insufficiency fractures. 4. Aortic Atherosclerosis (ICD10-I70.0).   11/06/2021 - 05/15/2022 Chemotherapy   Patient is on Treatment Plan : Cervical Paclitaxel q7d     02/10/2022 Imaging   1. Slight interval decrease in size of a heterogeneous mass within the right pelvis, involving the adjacent right aspect of the uterus and closely abutting the posterior urinary bladder,, consistent with treatment response. Fat stranding and presacral soft tissue thickening in the adjacent pelvis, unchanged. 2. Unchanged severe right hydronephrosis and hydroureter, the distal right ureter obstructed by mass in the pelvis, again with slight progression of right renal cortical atrophy. 3. Unchanged, mildly sclerotic nondisplaced bilateral sacral insufficiency fractures.   Aortic Atherosclerosis (ICD10-I70.0).   05/28/2022 Imaging   1. Slight interval increase in size of the lower uterine/cervical mass. 2. Persistent severe right-sided hydroureteronephrosis down to the level of the uterine/cervical mass. 3. Stable  right-sided posterior bladder wall thickening. 4. No findings for metastatic disease involving the abdomen/pelvis.   Aortic Atherosclerosis (ICD10-I70.0).   05/29/2022 - 05/29/2022 Chemotherapy   Patient is on Treatment Plan : OVARIAN Paclitaxel (80) D1,8,15,22 q28d     06/12/2022 -  Chemotherapy   Patient is on Treatment Plan : Cervix Gemcitabine D1,8 (1000) q21d       PHYSICAL EXAMINATION: ECOG PERFORMANCE STATUS: 1 - Symptomatic but completely ambulatory  Vitals:   07/09/22 0956  BP: 109/71  Pulse: 79  Resp: 18  Temp: 97.7 F (36.5 C)  SpO2: 97%   Filed Weights   07/09/22 0956  Weight: 91 lb 3.2 oz (41.4 kg)    GENERAL:alert, no distress and comfortable.  She looks thin and cachectic NEURO: alert & oriented x 3 with fluent speech, no focal motor/sensory deficits  LABORATORY DATA:  I have reviewed the data as listed    Component Value Date/Time   NA 139 07/09/2022 0924   K 4.0  07/09/2022 0924   CL 103 07/09/2022 0924   CO2 30 07/09/2022 0924   GLUCOSE 84 07/09/2022 0924   BUN 17 07/09/2022 0924   CREATININE 1.16 (H) 07/09/2022 0924   CALCIUM 9.4 07/09/2022 0924   PROT 7.2 07/09/2022 0924   ALBUMIN 4.1 07/09/2022 0924   AST 17 07/09/2022 0924   ALT 8 07/09/2022 0924   ALKPHOS 50 07/09/2022 0924   BILITOT 0.3 07/09/2022 0924   GFRNONAA 51 (L) 07/09/2022 0924   GFRAA >60 06/14/2020 0844   GFRAA >60 03/11/2020 0951    No results found for: "SPEP", "UPEP"  Lab Results  Component Value Date   WBC 2.2 (L) 07/09/2022   NEUTROABS 1.1 (L) 07/09/2022   HGB 9.7 (L) 07/09/2022   HCT 28.9 (L) 07/09/2022   MCV 90.6 07/09/2022   PLT 362 07/09/2022      Chemistry      Component Value Date/Time   NA 139 07/09/2022 0924   K 4.0 07/09/2022 0924   CL 103 07/09/2022 0924   CO2 30 07/09/2022 0924   BUN 17 07/09/2022 0924   CREATININE 1.16 (H) 07/09/2022 0924      Component Value Date/Time   CALCIUM 9.4 07/09/2022 0924   ALKPHOS 50 07/09/2022 0924   AST 17  07/09/2022 0924   ALT 8 07/09/2022 0924   BILITOT 0.3 07/09/2022 5053

## 2022-07-09 NOTE — Assessment & Plan Note (Signed)
She has compromised renal function due to hydronephrosis but overall asymptomatic I do not recommend stent placement long-term 

## 2022-07-09 NOTE — Assessment & Plan Note (Signed)
She tolerated treatment poorly She has lost weight and she has developed progressive pancytopenia We discussed dose reduction She would like to proceed with treatment without delay I will reduce dose of chemo by 20% I recommend we proceed with treatment with plan to repeat imaging study at the end of the year

## 2022-07-09 NOTE — Assessment & Plan Note (Signed)
We will continue B12 injections monthly I plan to monitor her B12 closely, repeat level is slightly better

## 2022-07-09 NOTE — Patient Instructions (Signed)
[facility]  Discharge Instructions: Thank you for choosing [facility] to provide your oncology and hematology care.   If you have a lab appointment with the Cancer Center, please go directly to the Cancer Center and check in at the registration area.   Wear comfortable clothing and clothing appropriate for easy access to any Portacath or PICC line.   We strive to give you quality time with your provider. You may need to reschedule your appointment if you arrive late (15 or more minutes).  Arriving late affects you and other patients whose appointments are after yours.  Also, if you miss three or more appointments without notifying the office, you may be dismissed from the clinic at the provider's discretion.      For prescription refill requests, have your pharmacy contact our office and allow 72 hours for refills to be completed.    Today you received the following chemotherapy and/or immunotherapy agents: Gemcitabine.       To help prevent nausea and vomiting after your treatment, we encourage you to take your nausea medication as directed.  BELOW ARE SYMPTOMS THAT SHOULD BE REPORTED IMMEDIATELY: *FEVER GREATER THAN 100.4 F (38 C) OR HIGHER *CHILLS OR SWEATING *NAUSEA AND VOMITING THAT IS NOT CONTROLLED WITH YOUR NAUSEA MEDICATION *UNUSUAL SHORTNESS OF BREATH *UNUSUAL BRUISING OR BLEEDING *URINARY PROBLEMS (pain or burning when urinating, or frequent urination) *BOWEL PROBLEMS (unusual diarrhea, constipation, pain near the anus) TENDERNESS IN MOUTH AND THROAT WITH OR WITHOUT PRESENCE OF ULCERS (sore throat, sores in mouth, or a toothache) UNUSUAL RASH, SWELLING OR PAIN  UNUSUAL VAGINAL DISCHARGE OR ITCHING   Items with * indicate a potential emergency and should be followed up as soon as possible or go to the Emergency Department if any problems should occur.  Please show the CHEMOTHERAPY ALERT CARD or IMMUNOTHERAPY ALERT CARD at check-in  to the Emergency Department and triage nurse.  Should you have questions after your visit or need to cancel or reschedule your appointment, please contact [facility]  Dept: [phone]  and follow the prompts.  Office hours are 8:00 a.m. to 4:30 p.m. Monday - Friday. Please note that voicemails left after 4:00 p.m. may not be returned until the following business day.  We are closed weekends and major holidays. You have access to a nurse at all times for urgent questions. Please call the main number to the clinic Dept: [phone] and follow the prompts.   For any non-urgent questions, you may also contact your provider using MyChart. We now offer e-Visits for anyone 18 and older to request care online for non-urgent symptoms. For details visit [email or web address].   Also download the MyChart app! Go to the app store, search "MyChart", open the app, select [facility], and log in with your MyChart username and password.  Masks are optional in the cancer centers. If you would like for your care team to wear a mask while they are taking care of you, please let them know. You may have one support person who is at least 70 years old accompany you for your appointments. 

## 2022-07-10 ENCOUNTER — Other Ambulatory Visit: Payer: Self-pay | Admitting: Hematology and Oncology

## 2022-07-10 ENCOUNTER — Other Ambulatory Visit: Payer: Self-pay

## 2022-07-15 ENCOUNTER — Inpatient Hospital Stay: Payer: Medicare Other | Attending: Hematology and Oncology

## 2022-07-15 ENCOUNTER — Inpatient Hospital Stay: Payer: Medicare Other

## 2022-07-15 VITALS — BP 105/66 | HR 68 | Temp 97.9°F | Resp 16 | Ht 62.0 in | Wt 88.2 lb

## 2022-07-15 DIAGNOSIS — Z7189 Other specified counseling: Secondary | ICD-10-CM

## 2022-07-15 DIAGNOSIS — E538 Deficiency of other specified B group vitamins: Secondary | ICD-10-CM

## 2022-07-15 DIAGNOSIS — D539 Nutritional anemia, unspecified: Secondary | ICD-10-CM | POA: Diagnosis not present

## 2022-07-15 DIAGNOSIS — Z5111 Encounter for antineoplastic chemotherapy: Secondary | ICD-10-CM | POA: Insufficient documentation

## 2022-07-15 DIAGNOSIS — R64 Cachexia: Secondary | ICD-10-CM | POA: Diagnosis not present

## 2022-07-15 DIAGNOSIS — D61818 Other pancytopenia: Secondary | ICD-10-CM | POA: Diagnosis not present

## 2022-07-15 DIAGNOSIS — C539 Malignant neoplasm of cervix uteri, unspecified: Secondary | ICD-10-CM

## 2022-07-15 DIAGNOSIS — N1330 Unspecified hydronephrosis: Secondary | ICD-10-CM | POA: Insufficient documentation

## 2022-07-15 LAB — CBC WITH DIFFERENTIAL (CANCER CENTER ONLY)
Abs Immature Granulocytes: 0.11 K/uL — ABNORMAL HIGH (ref 0.00–0.07)
Basophils Absolute: 0 K/uL (ref 0.0–0.1)
Basophils Relative: 1 %
Eosinophils Absolute: 0 K/uL (ref 0.0–0.5)
Eosinophils Relative: 0 %
HCT: 26.8 % — ABNORMAL LOW (ref 36.0–46.0)
Hemoglobin: 9 g/dL — ABNORMAL LOW (ref 12.0–15.0)
Immature Granulocytes: 4 %
Lymphocytes Relative: 23 %
Lymphs Abs: 0.6 K/uL — ABNORMAL LOW (ref 0.7–4.0)
MCH: 30.4 pg (ref 26.0–34.0)
MCHC: 33.6 g/dL (ref 30.0–36.0)
MCV: 90.5 fL (ref 80.0–100.0)
Monocytes Absolute: 0.2 K/uL (ref 0.1–1.0)
Monocytes Relative: 7 %
Neutro Abs: 1.6 K/uL — ABNORMAL LOW (ref 1.7–7.7)
Neutrophils Relative %: 65 %
Platelet Count: 139 K/uL — ABNORMAL LOW (ref 150–400)
RBC: 2.96 MIL/uL — ABNORMAL LOW (ref 3.87–5.11)
RDW: 14.6 % (ref 11.5–15.5)
WBC Count: 2.5 K/uL — ABNORMAL LOW (ref 4.0–10.5)
nRBC: 0 % (ref 0.0–0.2)

## 2022-07-15 LAB — CMP (CANCER CENTER ONLY)
ALT: 6 U/L (ref 0–44)
AST: 14 U/L — ABNORMAL LOW (ref 15–41)
Albumin: 3.9 g/dL (ref 3.5–5.0)
Alkaline Phosphatase: 47 U/L (ref 38–126)
Anion gap: 8 (ref 5–15)
BUN: 20 mg/dL (ref 8–23)
CO2: 27 mmol/L (ref 22–32)
Calcium: 9.2 mg/dL (ref 8.9–10.3)
Chloride: 105 mmol/L (ref 98–111)
Creatinine: 1.21 mg/dL — ABNORMAL HIGH (ref 0.44–1.00)
GFR, Estimated: 48 mL/min — ABNORMAL LOW (ref >60)
Glucose, Bld: 88 mg/dL (ref 70–99)
Potassium: 3.6 mmol/L (ref 3.5–5.1)
Sodium: 140 mmol/L (ref 135–145)
Total Bilirubin: 0.3 mg/dL (ref 0.3–1.2)
Total Protein: 7.2 g/dL (ref 6.5–8.1)

## 2022-07-15 MED ORDER — SODIUM CHLORIDE 0.9% FLUSH
10.0000 mL | Freq: Once | INTRAVENOUS | Status: AC
  Administered 2022-07-15: 10 mL

## 2022-07-15 MED ORDER — CYANOCOBALAMIN 1000 MCG/ML IJ SOLN
1000.0000 ug | Freq: Once | INTRAMUSCULAR | Status: AC
  Administered 2022-07-15: 1000 ug via INTRAMUSCULAR
  Filled 2022-07-15: qty 1

## 2022-07-15 MED ORDER — SODIUM CHLORIDE 0.9 % IV SOLN: Freq: Once | INTRAVENOUS | Status: AC

## 2022-07-15 MED ORDER — SODIUM CHLORIDE 0.9 % IV SOLN
800.0000 mg/m2 | Freq: Once | INTRAVENOUS | Status: AC
  Administered 2022-07-15: 1064 mg via INTRAVENOUS
  Filled 2022-07-15: qty 27.98

## 2022-07-15 MED ORDER — SODIUM CHLORIDE 0.9% FLUSH
10.0000 mL | INTRAVENOUS | Status: DC | PRN
  Administered 2022-07-15: 10 mL

## 2022-07-15 MED ORDER — HEPARIN SOD (PORK) LOCK FLUSH 100 UNIT/ML IV SOLN
500.0000 [IU] | Freq: Once | INTRAVENOUS | Status: AC | PRN
  Administered 2022-07-15: 500 [IU]

## 2022-07-15 MED ORDER — PROCHLORPERAZINE MALEATE 10 MG PO TABS
10.0000 mg | ORAL_TABLET | Freq: Once | ORAL | Status: AC
  Administered 2022-07-15: 10 mg via ORAL
  Filled 2022-07-15: qty 1

## 2022-07-15 NOTE — Patient Instructions (Signed)
[facility] ONCOLOG[facility]  Discharge Instructions: Thank you for choosing [facility] to provide your oncology and hematology care.   If you have a lab appointment with the [facility], please go directly to the [facility] and check in at the registration area.   Wear comfortable clothing and clothing appropriate for easy access to any Portacath or PICC line.   We strive to give you quality time with your provider. You may need to reschedule your appointment if you arrive late (15 or more minutes).  Arriving late affects you and other patients whose appointments are after yours.  Also, if you miss three or more appointments without notifying the office, you may be dismissed from the clinic at the provider's discretion.      For prescription refill requests, have your pharmacy contact our office and allow 72 hours for refills to be completed.    Today you received the following chemotherapy and/or immunotherapy agents :  Gemcitabine.   To help prevent nausea and vomiting after your treatment, we encourage you to take your nausea medication as directed.  BELOW ARE SYMPTOMS THAT SHOULD BE REPORTED IMMEDIATELY: *FEVER GREATER THAN 100.4 F (38 C) OR HIGHER *CHILLS OR SWEATING *NAUSEA AND VOMITING THAT IS NOT CONTROLLED WITH YOUR NAUSEA MEDICATION *UNUSUAL SHORTNESS OF BREATH *UNUSUAL BRUISING OR BLEEDING *URINARY PROBLEMS (pain or burning when urinating, or frequent urination) *BOWEL PROBLEMS (unusual diarrhea, constipation, pain near the anus) TENDERNESS IN MOUTH AND THROAT WITH OR WITHOUT PRESENCE OF ULCERS (sore throat, sores in mouth, or a toothache) UNUSUAL RASH, SWELLING OR PAIN  UNUSUAL VAGINAL DISCHARGE OR ITCHING   Items with * indicate a potential emergency and should be followed up as soon as possible or go to the Emergency Department if any problems should occur.  Please show the CHEMOTHERAPY ALERT CARD or IMMUNOTHERAPY ALERT CARD at check-in  to the Emergency Department and triage nurse.  Should you have questions after your visit or need to cancel or reschedule your appointment, please contact [facility]  Dept: [phone]  and follow the prompts.  Office hours are 8:00 a.m. to 4:30 p.m. Monday - Friday. Please note that voicemails left after 4:00 p.m. may not be returned until the following business day.  We are closed weekends and major holidays. You have access to a nurse at all times for urgent questions. Please call the main number to the clinic Dept: [phone] and follow the prompts.   For any non-urgent questions, you may also contact your provider using MyChart. We now offer e-Visits for anyone 68 and older to request care online for non-urgent symptoms. For details visit [email or web address].   Also download the MyChart app! Go to the app store, search "MyChart", open the app, select [facility], and log in with your MyChart username and password.  Masks are optional in the cancer centers. If you would like for your care team to wear a mask while they are taking care of you, please let them know. You may have one support person who is at least 70 years old accompany you for your appointments. Vitamin B12 Injection What is this medication? Vitamin B12 (VAHY tuh min B12) prevents and treats low vitamin B12 levels in your body. It is used in people who do not get enough vitamin B12 from their diet or when their digestive tract does not absorb enough. Vitamin B12 plays an important role in maintaining the health of your nervous system and red  blood cells. This medicine may be used for other purposes; ask your health care provider or pharmacist if you have questions. COMMON BRAND NAME(S): B-12 Compliance Kit, B-12 Injection Kit, Cyomin, Dodex, LA-12, Nutri-Twelve, Physicians EZ Use B-12, Primabalt What should I tell my care team before I take this medication? They need to know if you have any of  these conditions: Kidney disease Leber's disease Megaloblastic anemia An unusual or allergic reaction to cyanocobalamin, cobalt, other medications, foods, dyes, or preservatives Pregnant or trying to get pregnant Breast-feeding How should I use this medication? This medication is injected into a muscle or deeply under the skin. It is usually given in a clinic or care team's office. However, your care team may teach you how to inject yourself. Follow all instructions. Talk to your care team about the use of this medication in children. Special care may be needed. Overdosage: If you think you have taken too much of this medicine contact a poison control center or emergency room at once. NOTE: This medicine is only for you. Do not share this medicine with others. What if I miss a dose? If you are given your dose at a clinic or care team's office, call to reschedule your appointment. If you give your own injections, and you miss a dose, take it as soon as you can. If it is almost time for your next dose, take only that dose. Do not take double or extra doses. What may interact with this medication? Alcohol Colchicine This list may not describe all possible interactions. Give your health care provider a list of all the medicines, herbs, non-prescription drugs, or dietary supplements you use. Also tell them if you smoke, drink alcohol, or use illegal drugs. Some items may interact with your medicine. What should I watch for while using this medication? Visit your care team regularly. You may need blood work done while you are taking this medication. You may need to follow a special diet. Talk to your care team. Limit your alcohol intake and avoid smoking to get the best benefit. What side effects may I notice from receiving this medication? Side effects that you should report to your care team as soon as possible: Allergic reactions--skin rash, itching, hives, swelling of the face, lips, tongue, or  throat Swelling of the ankles, hands, or feet Trouble breathing Side effects that usually do not require medical attention (report to your care team if they continue or are bothersome): Diarrhea This list may not describe all possible side effects. Call your doctor for medical advice about side effects. You may report side effects to FDA at 1-800-FDA-1088. Where should I keep my medication? Keep out of the reach of children. Store at room temperature between 15 and 30 degrees C (59 and 85 degrees F). Protect from light. Throw away any unused medication after the expiration date. NOTE: This sheet is a summary. It may not cover all possible information. If you have questions about this medicine, talk to your doctor, pharmacist, or health care provider.  2023 Elsevier/Gold Standard (2007-10-22 00:00:00)

## 2022-07-21 ENCOUNTER — Other Ambulatory Visit: Payer: Self-pay

## 2022-07-24 ENCOUNTER — Inpatient Hospital Stay: Payer: Medicare Other

## 2022-07-24 ENCOUNTER — Other Ambulatory Visit: Payer: Self-pay

## 2022-07-24 ENCOUNTER — Other Ambulatory Visit: Payer: Self-pay | Admitting: Hematology and Oncology

## 2022-07-24 VITALS — BP 134/71 | HR 70 | Temp 97.8°F | Resp 17

## 2022-07-24 DIAGNOSIS — E538 Deficiency of other specified B group vitamins: Secondary | ICD-10-CM

## 2022-07-24 DIAGNOSIS — D61818 Other pancytopenia: Secondary | ICD-10-CM

## 2022-07-24 DIAGNOSIS — C539 Malignant neoplasm of cervix uteri, unspecified: Secondary | ICD-10-CM

## 2022-07-24 DIAGNOSIS — Z7189 Other specified counseling: Secondary | ICD-10-CM

## 2022-07-24 DIAGNOSIS — Z5111 Encounter for antineoplastic chemotherapy: Secondary | ICD-10-CM | POA: Diagnosis not present

## 2022-07-24 LAB — CBC WITH DIFFERENTIAL (CANCER CENTER ONLY)
Abs Immature Granulocytes: 0.26 10*3/uL — ABNORMAL HIGH (ref 0.00–0.07)
Basophils Absolute: 0 10*3/uL (ref 0.0–0.1)
Basophils Relative: 0 %
Eosinophils Absolute: 0.1 10*3/uL (ref 0.0–0.5)
Eosinophils Relative: 1 %
HCT: 25.7 % — ABNORMAL LOW (ref 36.0–46.0)
Hemoglobin: 8.4 g/dL — ABNORMAL LOW (ref 12.0–15.0)
Immature Granulocytes: 4 %
Lymphocytes Relative: 10 %
Lymphs Abs: 0.6 10*3/uL — ABNORMAL LOW (ref 0.7–4.0)
MCH: 30.2 pg (ref 26.0–34.0)
MCHC: 32.7 g/dL (ref 30.0–36.0)
MCV: 92.4 fL (ref 80.0–100.0)
Monocytes Absolute: 0.7 10*3/uL (ref 0.1–1.0)
Monocytes Relative: 11 %
Neutro Abs: 4.4 10*3/uL (ref 1.7–7.7)
Neutrophils Relative %: 74 %
Platelet Count: 106 10*3/uL — ABNORMAL LOW (ref 150–400)
RBC: 2.78 MIL/uL — ABNORMAL LOW (ref 3.87–5.11)
RDW: 15.8 % — ABNORMAL HIGH (ref 11.5–15.5)
WBC Count: 6 10*3/uL (ref 4.0–10.5)
nRBC: 0 % (ref 0.0–0.2)

## 2022-07-24 LAB — CMP (CANCER CENTER ONLY)
ALT: 6 U/L (ref 0–44)
AST: 12 U/L — ABNORMAL LOW (ref 15–41)
Albumin: 3.8 g/dL (ref 3.5–5.0)
Alkaline Phosphatase: 46 U/L (ref 38–126)
Anion gap: 6 (ref 5–15)
BUN: 17 mg/dL (ref 8–23)
CO2: 27 mmol/L (ref 22–32)
Calcium: 8.8 mg/dL — ABNORMAL LOW (ref 8.9–10.3)
Chloride: 110 mmol/L (ref 98–111)
Creatinine: 1.18 mg/dL — ABNORMAL HIGH (ref 0.44–1.00)
GFR, Estimated: 50 mL/min — ABNORMAL LOW (ref >60)
Glucose, Bld: 89 mg/dL (ref 70–99)
Potassium: 3.5 mmol/L (ref 3.5–5.1)
Sodium: 143 mmol/L (ref 135–145)
Total Bilirubin: 0.2 mg/dL — ABNORMAL LOW (ref 0.3–1.2)
Total Protein: 7.1 g/dL (ref 6.5–8.1)

## 2022-07-24 MED ORDER — HEPARIN SOD (PORK) LOCK FLUSH 100 UNIT/ML IV SOLN
500.0000 [IU] | Freq: Once | INTRAVENOUS | Status: AC | PRN
  Administered 2022-07-24: 500 [IU]

## 2022-07-24 MED ORDER — CYANOCOBALAMIN 1000 MCG/ML IJ SOLN: 1000.0000 ug | Freq: Once | INTRAMUSCULAR | Status: DC

## 2022-07-24 MED ORDER — SODIUM CHLORIDE 0.9 % IV SOLN: Freq: Once | INTRAVENOUS | Status: AC

## 2022-07-24 MED ORDER — SODIUM CHLORIDE 0.9 % IV SOLN
800.0000 mg/m2 | Freq: Once | INTRAVENOUS | Status: AC
  Administered 2022-07-24: 1064 mg via INTRAVENOUS
  Filled 2022-07-24: qty 27.98

## 2022-07-24 MED ORDER — SODIUM CHLORIDE 0.9% FLUSH
10.0000 mL | Freq: Once | INTRAVENOUS | Status: AC
  Administered 2022-07-24: 10 mL

## 2022-07-24 MED ORDER — PROCHLORPERAZINE MALEATE 10 MG PO TABS
10.0000 mg | ORAL_TABLET | Freq: Once | ORAL | Status: AC
  Administered 2022-07-24: 10 mg via ORAL
  Filled 2022-07-24: qty 1

## 2022-07-24 MED ORDER — SODIUM CHLORIDE 0.9% FLUSH
10.0000 mL | INTRAVENOUS | Status: DC | PRN
  Administered 2022-07-24: 10 mL

## 2022-07-24 NOTE — Patient Instructions (Signed)
[facility] ONCOLOG[facility]  Discharge Instructions: Thank you for choosing [facility] to provide your oncology and hematology care.   If you have a lab appointment with the [facility], please go directly to the [facility] and check in at the registration area.   Wear comfortable clothing and clothing appropriate for easy access to any Portacath or PICC line.   We strive to give you quality time with your provider. You may need to reschedule your appointment if you arrive late (15 or more minutes).  Arriving late affects you and other patients whose appointments are after yours.  Also, if you miss three or more appointments without notifying the office, you may be dismissed from the clinic at the provider's discretion.      For prescription refill requests, have your pharmacy contact our office and allow 72 hours for refills to be completed.    Today you received the following chemotherapy and/or immunotherapy agents :  Gemcitabine.   To help prevent nausea and vomiting after your treatment, we encourage you to take your nausea medication as directed.  BELOW ARE SYMPTOMS THAT SHOULD BE REPORTED IMMEDIATELY: *FEVER GREATER THAN 100.4 F (38 C) OR HIGHER *CHILLS OR SWEATING *NAUSEA AND VOMITING THAT IS NOT CONTROLLED WITH YOUR NAUSEA MEDICATION *UNUSUAL SHORTNESS OF BREATH *UNUSUAL BRUISING OR BLEEDING *URINARY PROBLEMS (pain or burning when urinating, or frequent urination) *BOWEL PROBLEMS (unusual diarrhea, constipation, pain near the anus) TENDERNESS IN MOUTH AND THROAT WITH OR WITHOUT PRESENCE OF ULCERS (sore throat, sores in mouth, or a toothache) UNUSUAL RASH, SWELLING OR PAIN  UNUSUAL VAGINAL DISCHARGE OR ITCHING   Items with * indicate a potential emergency and should be followed up as soon as possible or go to the Emergency Department if any problems should occur.  Please show the CHEMOTHERAPY ALERT CARD or IMMUNOTHERAPY ALERT CARD at check-in  to the Emergency Department and triage nurse.  Should you have questions after your visit or need to cancel or reschedule your appointment, please contact [facility]  Dept: [phone]  and follow the prompts.  Office hours are 8:00 a.m. to 4:30 p.m. Monday - Friday. Please note that voicemails left after 4:00 p.m. may not be returned until the following business day.  We are closed weekends and major holidays. You have access to a nurse at all times for urgent questions. Please call the main number to the clinic Dept: [phone] and follow the prompts.   For any non-urgent questions, you may also contact your provider using MyChart. We now offer e-Visits for anyone 69 and older to request care online for non-urgent symptoms. For details visit [email or web address].   Also download the MyChart app! Go to the app store, search "MyChart", open the app, select [facility], and log in with your MyChart username and password.  Masks are optional in the cancer centers. If you would like for your care team to wear a mask while they are taking care of you, please let them know. You may have one support person who is at least 70 years old accompany you for your appointments. Vitamin B12 Injection What is this medication? Vitamin B12 (VAHY tuh min B12) prevents and treats low vitamin B12 levels in your body. It is used in people who do not get enough vitamin B12 from their diet or when their digestive tract does not absorb enough. Vitamin B12 plays an important role in maintaining the health of your nervous system and red  blood cells. This medicine may be used for other purposes; ask your health care provider or pharmacist if you have questions. COMMON BRAND NAME(S): B-12 Compliance Kit, B-12 Injection Kit, Cyomin, Dodex, LA-12, Nutri-Twelve, Physicians EZ Use B-12, Primabalt What should I tell my care team before I take this medication? They need to know if you have any of  these conditions: Kidney disease Leber's disease Megaloblastic anemia An unusual or allergic reaction to cyanocobalamin, cobalt, other medications, foods, dyes, or preservatives Pregnant or trying to get pregnant Breast-feeding How should I use this medication? This medication is injected into a muscle or deeply under the skin. It is usually given in a clinic or care team's office. However, your care team may teach you how to inject yourself. Follow all instructions. Talk to your care team about the use of this medication in children. Special care may be needed. Overdosage: If you think you have taken too much of this medicine contact a poison control center or emergency room at once. NOTE: This medicine is only for you. Do not share this medicine with others. What if I miss a dose? If you are given your dose at a clinic or care team's office, call to reschedule your appointment. If you give your own injections, and you miss a dose, take it as soon as you can. If it is almost time for your next dose, take only that dose. Do not take double or extra doses. What may interact with this medication? Alcohol Colchicine This list may not describe all possible interactions. Give your health care provider a list of all the medicines, herbs, non-prescription drugs, or dietary supplements you use. Also tell them if you smoke, drink alcohol, or use illegal drugs. Some items may interact with your medicine. What should I watch for while using this medication? Visit your care team regularly. You may need blood work done while you are taking this medication. You may need to follow a special diet. Talk to your care team. Limit your alcohol intake and avoid smoking to get the best benefit. What side effects may I notice from receiving this medication? Side effects that you should report to your care team as soon as possible: Allergic reactions--skin rash, itching, hives, swelling of the face, lips, tongue, or  throat Swelling of the ankles, hands, or feet Trouble breathing Side effects that usually do not require medical attention (report to your care team if they continue or are bothersome): Diarrhea This list may not describe all possible side effects. Call your doctor for medical advice about side effects. You may report side effects to FDA at 1-800-FDA-1088. Where should I keep my medication? Keep out of the reach of children. Store at room temperature between 15 and 30 degrees C (59 and 85 degrees F). Protect from light. Throw away any unused medication after the expiration date. NOTE: This sheet is a summary. It may not cover all possible information. If you have questions about this medicine, talk to your doctor, pharmacist, or health care provider.  2023 Elsevier/Gold Standard (2007-10-22 00:00:00)

## 2022-07-26 ENCOUNTER — Other Ambulatory Visit: Payer: Self-pay

## 2022-07-28 ENCOUNTER — Other Ambulatory Visit: Payer: Self-pay

## 2022-07-30 ENCOUNTER — Inpatient Hospital Stay: Payer: Medicare Other | Admitting: Hematology and Oncology

## 2022-07-30 ENCOUNTER — Inpatient Hospital Stay: Payer: Medicare Other

## 2022-08-03 ENCOUNTER — Encounter: Payer: Self-pay | Admitting: Hematology and Oncology

## 2022-08-03 ENCOUNTER — Inpatient Hospital Stay (HOSPITAL_BASED_OUTPATIENT_CLINIC_OR_DEPARTMENT_OTHER): Payer: Medicare Other | Admitting: Hematology and Oncology

## 2022-08-03 ENCOUNTER — Other Ambulatory Visit: Payer: Self-pay

## 2022-08-03 ENCOUNTER — Inpatient Hospital Stay: Payer: Medicare Other

## 2022-08-03 VITALS — BP 137/71 | HR 78 | Temp 97.7°F | Resp 18 | Ht 62.0 in | Wt 90.0 lb

## 2022-08-03 VITALS — BP 124/63 | HR 69 | Temp 97.6°F | Resp 18

## 2022-08-03 DIAGNOSIS — Z7189 Other specified counseling: Secondary | ICD-10-CM

## 2022-08-03 DIAGNOSIS — C539 Malignant neoplasm of cervix uteri, unspecified: Secondary | ICD-10-CM

## 2022-08-03 DIAGNOSIS — N1330 Unspecified hydronephrosis: Secondary | ICD-10-CM

## 2022-08-03 DIAGNOSIS — D539 Nutritional anemia, unspecified: Secondary | ICD-10-CM

## 2022-08-03 DIAGNOSIS — R64 Cachexia: Secondary | ICD-10-CM

## 2022-08-03 DIAGNOSIS — D61818 Other pancytopenia: Secondary | ICD-10-CM

## 2022-08-03 DIAGNOSIS — E538 Deficiency of other specified B group vitamins: Secondary | ICD-10-CM

## 2022-08-03 DIAGNOSIS — Z5111 Encounter for antineoplastic chemotherapy: Secondary | ICD-10-CM | POA: Diagnosis not present

## 2022-08-03 LAB — CBC WITH DIFFERENTIAL (CANCER CENTER ONLY)
Abs Immature Granulocytes: 0.31 10*3/uL — ABNORMAL HIGH (ref 0.00–0.07)
Basophils Absolute: 0.1 10*3/uL (ref 0.0–0.1)
Basophils Relative: 1 %
Eosinophils Absolute: 0.1 10*3/uL (ref 0.0–0.5)
Eosinophils Relative: 2 %
HCT: 26.2 % — ABNORMAL LOW (ref 36.0–46.0)
Hemoglobin: 8.7 g/dL — ABNORMAL LOW (ref 12.0–15.0)
Immature Granulocytes: 6 %
Lymphocytes Relative: 12 %
Lymphs Abs: 0.6 10*3/uL — ABNORMAL LOW (ref 0.7–4.0)
MCH: 31.2 pg (ref 26.0–34.0)
MCHC: 33.2 g/dL (ref 30.0–36.0)
MCV: 93.9 fL (ref 80.0–100.0)
Monocytes Absolute: 1.1 10*3/uL — ABNORMAL HIGH (ref 0.1–1.0)
Monocytes Relative: 20 %
Neutro Abs: 3.2 10*3/uL (ref 1.7–7.7)
Neutrophils Relative %: 59 %
Platelet Count: 270 10*3/uL (ref 150–400)
RBC: 2.79 MIL/uL — ABNORMAL LOW (ref 3.87–5.11)
RDW: 16.9 % — ABNORMAL HIGH (ref 11.5–15.5)
Smear Review: NORMAL
WBC Count: 5.4 10*3/uL (ref 4.0–10.5)
nRBC: 0 % (ref 0.0–0.2)

## 2022-08-03 LAB — CMP (CANCER CENTER ONLY)
ALT: 6 U/L (ref 0–44)
AST: 13 U/L — ABNORMAL LOW (ref 15–41)
Albumin: 3.8 g/dL (ref 3.5–5.0)
Alkaline Phosphatase: 50 U/L (ref 38–126)
Anion gap: 7 (ref 5–15)
BUN: 17 mg/dL (ref 8–23)
CO2: 28 mmol/L (ref 22–32)
Calcium: 9.4 mg/dL (ref 8.9–10.3)
Chloride: 106 mmol/L (ref 98–111)
Creatinine: 1.18 mg/dL — ABNORMAL HIGH (ref 0.44–1.00)
GFR, Estimated: 50 mL/min — ABNORMAL LOW (ref >60)
Glucose, Bld: 103 mg/dL — ABNORMAL HIGH (ref 70–99)
Potassium: 3.7 mmol/L (ref 3.5–5.1)
Sodium: 141 mmol/L (ref 135–145)
Total Bilirubin: 0.2 mg/dL — ABNORMAL LOW (ref 0.3–1.2)
Total Protein: 6.9 g/dL (ref 6.5–8.1)

## 2022-08-03 MED ORDER — SODIUM CHLORIDE 0.9 % IV SOLN: Freq: Once | INTRAVENOUS | Status: AC

## 2022-08-03 MED ORDER — LEVOTHYROXINE SODIUM 100 MCG PO TABS: ORAL_TABLET | ORAL | 3 refills | Status: DC

## 2022-08-03 MED ORDER — PROCHLORPERAZINE MALEATE 10 MG PO TABS
10.0000 mg | ORAL_TABLET | Freq: Once | ORAL | Status: AC
  Administered 2022-08-03: 10 mg via ORAL
  Filled 2022-08-03: qty 1

## 2022-08-03 MED ORDER — HEPARIN SOD (PORK) LOCK FLUSH 100 UNIT/ML IV SOLN
500.0000 [IU] | Freq: Once | INTRAVENOUS | Status: AC | PRN
  Administered 2022-08-03: 500 [IU]

## 2022-08-03 MED ORDER — SODIUM CHLORIDE 0.9 % IV SOLN
800.0000 mg/m2 | Freq: Once | INTRAVENOUS | Status: AC
  Administered 2022-08-03: 1064 mg via INTRAVENOUS
  Filled 2022-08-03: qty 27.98

## 2022-08-03 MED ORDER — SODIUM CHLORIDE 0.9% FLUSH
10.0000 mL | Freq: Once | INTRAVENOUS | Status: AC
  Administered 2022-08-03: 10 mL

## 2022-08-03 MED ORDER — SODIUM CHLORIDE 0.9% FLUSH
10.0000 mL | Freq: Once | INTRAVENOUS | Status: AC
  Administered 2022-08-03: 10 mL via INTRAVENOUS

## 2022-08-03 NOTE — Assessment & Plan Note (Signed)
This is likely due to recent treatment. The patient denies recent history of bleeding such as epistaxis, hematuria or hematochezia. She is asymptomatic from the anemia. I will observe for now.   

## 2022-08-03 NOTE — Assessment & Plan Note (Signed)
She has lost some weight but she felt that her appetite is improving We discussed importance of keeping her weight up while on treatment

## 2022-08-03 NOTE — Assessment & Plan Note (Signed)
She tolerated treatment poorly She has lost weight and she has developed progressive pancytopenia Her dose has been modified and the schedule has been modified recently due to her schedule I recommend we proceed with treatment with plan to repeat imaging study at the end of the year

## 2022-08-03 NOTE — Assessment & Plan Note (Signed)
She has compromised renal function due to hydronephrosis but overall asymptomatic I do not recommend stent placement long-term 

## 2022-08-03 NOTE — Patient Instructions (Signed)
[facility]  Discharge Instructions: Thank you for choosing [facility] to provide your oncology and hematology care.   If you have a lab appointment with the Cancer Center, please go directly to the Cancer Center and check in at the registration area.   Wear comfortable clothing and clothing appropriate for easy access to any Portacath or PICC line.   We strive to give you quality time with your provider. You may need to reschedule your appointment if you arrive late (15 or more minutes).  Arriving late affects you and other patients whose appointments are after yours.  Also, if you miss three or more appointments without notifying the office, you may be dismissed from the clinic at the provider's discretion.      For prescription refill requests, have your pharmacy contact our office and allow 72 hours for refills to be completed.    Today you received the following chemotherapy and/or immunotherapy agents: Gemcitabine (Gemzar)      To help prevent nausea and vomiting after your treatment, we encourage you to take your nausea medication as directed.  BELOW ARE SYMPTOMS THAT SHOULD BE REPORTED IMMEDIATELY: *FEVER GREATER THAN 100.4 F (38 C) OR HIGHER *CHILLS OR SWEATING *NAUSEA AND VOMITING THAT IS NOT CONTROLLED WITH YOUR NAUSEA MEDICATION *UNUSUAL SHORTNESS OF BREATH *UNUSUAL BRUISING OR BLEEDING *URINARY PROBLEMS (pain or burning when urinating, or frequent urination) *BOWEL PROBLEMS (unusual diarrhea, constipation, pain near the anus) TENDERNESS IN MOUTH AND THROAT WITH OR WITHOUT PRESENCE OF ULCERS (sore throat, sores in mouth, or a toothache) UNUSUAL RASH, SWELLING OR PAIN  UNUSUAL VAGINAL DISCHARGE OR ITCHING   Items with * indicate a potential emergency and should be followed up as soon as possible or go to the Emergency Department if any problems should occur.  Please show the CHEMOTHERAPY ALERT CARD or IMMUNOTHERAPY ALERT CARD at  check-in to the Emergency Department and triage nurse.  Should you have questions after your visit or need to cancel or reschedule your appointment, please contact [facility]  Dept: [phone]  and follow the prompts.  Office hours are 8:00 a.m. to 4:30 p.m. Monday - Friday. Please note that voicemails left after 4:00 p.m. may not be returned until the following business day.  We are closed weekends and major holidays. You have access to a nurse at all times for urgent questions. Please call the main number to the clinic Dept: [phone] and follow the prompts.   For any non-urgent questions, you may also contact your provider using MyChart. We now offer e-Visits for anyone 18 and older to request care online for non-urgent symptoms. For details visit [email or web address].   Also download the MyChart app! Go to the app store, search "MyChart", open the app, select [facility], and log in with your MyChart username and password.  Masks are optional in the cancer centers. If you would like for your care team to wear a mask while they are taking care of you, please let them know. You may have one support person who is at least 70 years old accompany you for your appointments. 

## 2022-08-03 NOTE — Progress Notes (Signed)
[facility] OFFICE PROGRESS NOTE  Patient Care Team: Default, Provider, MD as PCP - General  ASSESSMENT & PLAN:  Malignant neoplasm of cervix ([place]) She tolerated treatment poorly She has lost weight and she has developed progressive pancytopenia Her dose has been modified and the schedule has been modified recently due to her schedule I recommend we proceed with treatment with plan to repeat imaging study at the end of the year  Deficiency anemia This is likely due to recent treatment. The patient denies recent history of bleeding such as epistaxis, hematuria or hematochezia. She is asymptomatic from the anemia. I will observe for now.   Hydronephrosis of right kidney She has compromised renal function due to hydronephrosis but overall asymptomatic I do not recommend stent placement long-term  Cachexia ([place]) She has lost some weight but she felt that her appetite is improving We discussed importance of keeping her weight up while on treatment  Orders Placed This Encounter  Procedures   CT ABDOMEN PELVIS W CONTRAST    Standing Status:   Future    Standing Expiration Date:   08/04/2023    Order Specific Question:   If indicated for the ordered procedure, I authorize the administration of contrast media per Radiology protocol    Answer:   Yes    Order Specific Question:   Preferred imaging location?    Answer:   [facility]    Order Specific Question:   Radiology Contrast Protocol - do NOT remove file path    Answer:   [email or web address]   CBC with Differential (Cancer Center Only)    Standing Status:   Future    Standing Expiration Date:   08/29/2023   CMP ([place] only)    Standing Status:   Future    Standing Expiration Date:   08/29/2023   CBC with Differential ([facility] Only)    Standing Status:   Future    Standing Expiration Date:   09/05/2023   CMP ([place] only)    Standing Status:    Future    Standing Expiration Date:   09/05/2023    All questions were answered. The patient knows to call the clinic with any problems, questions or concerns. The total time spent in the appointment was 30 minutes encounter with patients including review of chart and various tests results, discussions about plan of care and coordination of care plan   [name], MD 08/03/2022 9:23 AM  INTERVAL HISTORY: Please see below for problem oriented charting. she returns for treatment follow-up on single agent gemcitabine She denies recent pelvic pain or vaginal spotting She denies symptoms from anemia  REVIEW OF SYSTEMS:   Constitutional: Denies fevers, chills or abnormal weight loss Eyes: Denies blurriness of vision Ears, nose, mouth, throat, and face: Denies mucositis or sore throat Respiratory: Denies cough, dyspnea or wheezes Cardiovascular: Denies palpitation, chest discomfort or lower extremity swelling Gastrointestinal:  Denies nausea, heartburn or change in bowel habits Skin: Denies abnormal skin rashes Lymphatics: Denies new lymphadenopathy or easy bruising Neurological:Denies numbness, tingling or new weaknesses Behavioral/Psych: Mood is stable, no new changes  All other systems were reviewed with the patient and are negative.  I have reviewed the past medical history, past surgical history, social history and family history with the patient and they are unchanged from previous note.  ALLERGIES:  has No Known Allergies.  MEDICATIONS:  Current Outpatient Medications  Medication Sig Dispense Refill   levothyroxine (SYNTHROID) 100 MCG tablet TAKE 1  TABLET(100 MCG) BY MOUTH DAILY BEFORE BREAKFAST 30 tablet 3   lidocaine-prilocaine (EMLA) cream Apply to affected area once 30 g 3   Multiple Vitamin (MULTIVITAMIN) tablet Take 1 tablet by mouth daily.     ondansetron (ZOFRAN) 8 MG tablet Take 1 tablet (8 mg total) by mouth 2 (two) times daily as needed (Nausea or vomiting). 30  tablet 1   prochlorperazine (COMPAZINE) 10 MG tablet Take 1 tablet (10 mg total) by mouth every 6 (six) hours as needed (Nausea or vomiting). 30 tablet 1   No current facility-administered medications for this visit.   Facility-Administered Medications Ordered in Other Visits  Medication Dose Route Frequency Provider Last Rate Last Admin   diphenhydrAMINE (BENADRYL) 50 MG/ML injection            famotidine (PEPCID) 20-0.9 MG/50ML-% IVPB             SUMMARY OF ONCOLOGIC HISTORY: Oncology History Overview Note  Cancer Staging Malignant neoplasm of cervix ([place]) Staging form: Cervix Uteri, AJCC 8th Edition - Clinical: Stage IIIB (cT3b, cN1, cM0) - Signed by [name], MD on 05/04/2018  PD-L1 1% Progressed on carboplatin, pembrolizumab, cisplatin and taxol   Malignant neoplasm of cervix ([place])  03/13/2018 Imaging   US pelvis There are mobile echogenic foci within the endometrial cavity suggestive of gas. This is nonspecific in etiology however may be secondary to endometritis. Correlate for history of recent instrumentation. This obscures visualization of the endometrial tissue and therefore a follow-up pelvic ultrasound in 2-4 weeks is recommended after resolution of the acute symptomatology if symptoms persist to further evaluate the endometrium.     03/13/2018 Initial Diagnosis   She initially presented with PMB   04/12/2018 Pathology Results   Cervix, biopsy, mass - INVASIVE SQUAMOUS CELL CARCINOMA - SEE COMMENT   04/12/2018 Surgery   PREOPERATIVE DIAGNOSIS:  Postmenopausal vaginal bleeding. POSTOPERATIVE DIAGNOSIS: The same PROCEDURE: Exam under anesthesia, pap smear, cervical mass biopsy SURGEON:  Dr. [name]   INDICATIONS: 71 y.o. yo G0P0000 with PMB here for exam under anesthesia.Risks of surgery were discussed with the patient including but not limited to: bleeding which may require transfusion; infection which may require antibiotics; injury to uterus or surrounding  organs; need for additional procedures including laparotomy or laparoscopy; and other postoperative/anesthesia complications. Written informed consent was obtained.     FINDINGS:  An 8-week size midline uterus.  No adnexal mass palpable on exam. Normal cervix not visualized. Friable mass seen in involving the vagina at the level of the cervix obliterating the anterior and posterior fornix.   ANESTHESIA:   General INTRAVENOUS FLUIDS:  300 ml of LR ESTIMATED BLOOD LOSS: 20 ml. SPECIMENS: pap smear, cervical mass biopsy COMPLICATIONS:  None immediate.     04/25/2018 PET scan   1. Large cervical mass may invade into the myometrium in down into the vagina, maximum SUV 21.3. There is a malignant left external iliac node measuring 1.4 cm in short axis with maximum SUV 14.1. 2. Accentuated activity in the cecum and ascending colon is likely physiologic given that it has no CT correlate. Correlation with the patient's colon cancer screening history is recommended. If screening is not up-to-date, appropriate screening should be considered. 3.  Aortic Atherosclerosis (ICD10-I70.0).     04/28/2018 Surgery   Pre-operative Diagnosis:  At least Stage 2B SCCa Cervical Cancer   Post-operative Diagnosis: At least clinical Stage 3A SCCa Cervical Cancer   Operation:  Exam under anesthesia due to intolerance for vaginal exam in  office Cystoscopy due to concern for extension to posterior bladder (from anterior vaginal wall lesion)   Operative Findings:   Parametrial involvement on right  Right sided subvaginal tumor burden ~3cm. This approximates the right ureteral insertion into the bladder based on my exam during cystoscopy. Extension of disease down upper half of vagina on lateral and posterior walls. Extension of disease down to lower third of anterior vagina (versus skip lesion) thus making her Stage 3A Cystoscopy revealed patent bilateral ureteral orifices and no bladder mucosa invasion or areas of  concern. Rectal exam grossly no disease.     05/04/2018 Cancer Staging   Staging form: Cervix Uteri, AJCC 8th Edition - Clinical: Stage IIIB (cT3b, cN1, cM0) - Signed by [name], MD on 05/04/2018   05/12/2018 Procedure   Successful 8 French right internal jugular vein power port placement with its tip at the SVC/RA junction   05/13/2018 - 06/24/2018 Chemotherapy   The patient had weekly cisplatin given with concurrent radiation    05/17/2018 - 07/27/2018 Radiation Therapy   Radiation treatment dates:   05/17/2018-07/27/2018   Site/dose: 1. Cervix, 1.8 Gy in 25 fractions for a total dose of 45 Gy                    2. Pelvis  Boost, 1.8 Gy in 5 fractions for a total dose of 9 Gy                    3. Cervix, 5.5 Gy in 5 fractions for a total dose of 27.5 Gy   06/03/2018 Adverse Reaction   She missed her chemo due to severe depression and was sent to the ER for suicidal ideation.  She was seen by psychiatrist.   10/27/2018 PET scan   IMPRESSION: 1. Complete metabolic response to therapy, without residual or recurrent hypermetabolic disease. 2.  Aortic Atherosclerosis (ICD10-I70.0).     11/17/2018 Imaging   Successful right IJ vein Port-A-Cath explant.   03/29/2020 PET scan   1. Hypermetabolic lesion along the right vaginal cuff is highly worrisome for recurrent cervical cancer. No evidence of distant metastatic disease. 2.  Aortic atherosclerosis (ICD10-I70.0).   04/26/2020 - 12/10/2020 Chemotherapy   The patient had CARBOplatin  for chemotherapy treatment.      Genetic Testing   Patient has genetic testing done for PD-L1. Results revealed patient has the following:  PD-L1 combined positive score (CPS): 1%   07/08/2020 Imaging   1. Unchanged post treatment appearance of the pelvis, including a soft tissue nodule in the low right hemipelvis adjacent to the vagina measuring 2.8 x 1.7 cm, previously FDG avid and consistent with malignancy. 2. No evidence of nodal or distant  metastatic disease in the abdomen or pelvis. 3. Aortic Atherosclerosis (ICD10-I70.0).   10/14/2020 Imaging   1. Unchanged right eccentric soft tissue mass in the low right hemipelvis centered on the cervix and vagina, measuring 2.9 x 1.9 cm. 2. New small volume fluid within the endometrial cavity, abnormal in the postmenopausal setting and suggesting obstruction of the cervical os by soft tissue mass. 3. No evidence of lymphadenopathy or distant metastatic disease in the abdomen or pelvis.   Aortic Atherosclerosis (ICD10-I70.0).   12/24/2020 Imaging   1. Abnormal hypodense masslike appearance continuous with hypodense masslike lesion probably centrally within a right eccentric cervix (versus right paracervical mass). The endometrial portion has enlarged compared to 10/14/2020 and active malignancy is suspected. 2. No adenopathy or findings of distant spread. 3. Other  imaging findings of potential clinical significance: Aortic Atherosclerosis (ICD10-I70.0). Mild cardiomegaly. Stable scarring in the left lower lobe. Sigmoid colon scattered diverticula. Tarlov cysts at the S2 level. Vertical sclerosis in the sacral ala possibly due to stress fractures or prior radiation therapy.     01/06/2021 - 06/19/2021 Chemotherapy   Patient is on Treatment Plan : UTERINE Pembrolizumab q21d      04/07/2021 Imaging   1. The previously hypermetabolic right vaginal mass currently measures 12 cubic cm in volume, previously 11 cubic cm on 12/23/2020. 2. No new adenopathy or new findings of distant metastatic spread. 3. New wall thickening in several loops of distal ileum suspicious for inflammation/enteritis. 4. Other imaging findings of potential clinical significance: Mild cardiomegaly. Mild sigmoid colon diverticulosis. Stable bony demineralization with vertical sclerosis in the sacral ala likely from old insufficiency fracture.   07/10/2021 Imaging   1. New moderate right-sided hydroureteronephrosis with  decreased perfusion to the right kidney compatible with obstructive uropathy.  2. Interval progression of the heterogeneous low-density uterine lesion, with progression of enhancing soft tissue extending inferiorly to the right, compatible with previously described disease in the right vaginal wall. Represents the site of right ureteral obstruction. 3. No evidence for new metastatic disease in the abdomen or pelvis. 4. Bilateral sacral insufficiency fractures. 5. Trace free fluid in the pelvis. 6. Aortic Atherosclerosis (ICD10-I70.0).     07/22/2021 Procedure   Successful placement of a right internal jugular approach power injectable Port-A-Cath.     07/24/2021 - 10/02/2021 Chemotherapy   Patient is on Treatment Plan : cervical cancer Cisplatin q7d       10/16/2021 Imaging   1. Mild interval increase in size of heterogeneous tumor arising from within the uterus with local tumor extension into the right pelvic sidewall. 2. Persistent right-sided obstructive uropathy with severe right hydronephrosis and progressive right renal cortical volume loss. Right hydroureter extends into the pelvis where it is obstructed by the uterine/cervical mass. 3. Bilateral sacral insufficiency fractures. 4. Aortic Atherosclerosis (ICD10-I70.0).   11/06/2021 - 05/15/2022 Chemotherapy   Patient is on Treatment Plan : Cervical Paclitaxel q7d     02/10/2022 Imaging   1. Slight interval decrease in size of a heterogeneous mass within the right pelvis, involving the adjacent right aspect of the uterus and closely abutting the posterior urinary bladder,, consistent with treatment response. Fat stranding and presacral soft tissue thickening in the adjacent pelvis, unchanged. 2. Unchanged severe right hydronephrosis and hydroureter, the distal right ureter obstructed by mass in the pelvis, again with slight progression of right renal cortical atrophy. 3. Unchanged, mildly sclerotic nondisplaced bilateral sacral insufficiency  fractures.   Aortic Atherosclerosis (ICD10-I70.0).   05/28/2022 Imaging   1. Slight interval increase in size of the lower uterine/cervical mass. 2. Persistent severe right-sided hydroureteronephrosis down to the level of the uterine/cervical mass. 3. Stable right-sided posterior bladder wall thickening. 4. No findings for metastatic disease involving the abdomen/pelvis.   Aortic Atherosclerosis (ICD10-I70.0).   05/29/2022 - 05/29/2022 Chemotherapy   Patient is on Treatment Plan : OVARIAN Paclitaxel (80) D1,8,15,22 q28d     06/12/2022 -  Chemotherapy   Patient is on Treatment Plan : Cervix Gemcitabine D1,8 (1000) q21d       PHYSICAL EXAMINATION: ECOG PERFORMANCE STATUS: 1 - Symptomatic but completely ambulatory  Vitals:   08/03/22 0852  BP: 137/71  Pulse: 78  Resp: 18  Temp: 97.7 F (36.5 C)  SpO2: 100%   Filed Weights   08/03/22 0852  Weight: 90 lb (40.8  kg)    GENERAL:alert, no distress and comfortable SKIN: skin color, texture, turgor are normal, no rashes or significant lesions NEURO: alert & oriented x 3 with fluent speech, no focal motor/sensory deficits  LABORATORY DATA:  I have reviewed the data as listed    Component Value Date/Time   NA 141 08/03/2022 0814   K 3.7 08/03/2022 0814   CL 106 08/03/2022 0814   CO2 28 08/03/2022 0814   GLUCOSE 103 (H) 08/03/2022 0814   BUN 17 08/03/2022 0814   CREATININE 1.18 (H) 08/03/2022 0814   CALCIUM 9.4 08/03/2022 0814   PROT 6.9 08/03/2022 0814   ALBUMIN 3.8 08/03/2022 0814   AST 13 (L) 08/03/2022 0814   ALT 6 08/03/2022 0814   ALKPHOS 50 08/03/2022 0814   BILITOT 0.2 (L) 08/03/2022 0814   GFRNONAA 50 (L) 08/03/2022 0814   GFRAA >60 06/14/2020 0844   GFRAA >60 03/11/2020 0951    No results found for: "SPEP", "UPEP"  Lab Results  Component Value Date   WBC 5.4 08/03/2022   NEUTROABS PENDING 08/03/2022   HGB 8.7 (L) 08/03/2022   HCT 26.2 (L) 08/03/2022   MCV 93.9 08/03/2022   PLT 270 08/03/2022       Chemistry      Component Value Date/Time   NA 141 08/03/2022 0814   K 3.7 08/03/2022 0814   CL 106 08/03/2022 0814   CO2 28 08/03/2022 0814   BUN 17 08/03/2022 0814   CREATININE 1.18 (H) 08/03/2022 0814      Component Value Date/Time   CALCIUM 9.4 08/03/2022 0814   ALKPHOS 50 08/03/2022 0814   AST 13 (L) 08/03/2022 0814   ALT 6 08/03/2022 0814   BILITOT 0.2 (L) 08/03/2022 4174

## 2022-08-04 ENCOUNTER — Other Ambulatory Visit: Payer: Self-pay

## 2022-08-13 ENCOUNTER — Other Ambulatory Visit: Payer: Self-pay

## 2022-08-14 ENCOUNTER — Inpatient Hospital Stay: Payer: Medicare Other

## 2022-08-14 ENCOUNTER — Other Ambulatory Visit: Payer: Self-pay

## 2022-08-14 ENCOUNTER — Inpatient Hospital Stay: Payer: Medicare Other | Attending: Hematology and Oncology

## 2022-08-14 VITALS — BP 118/80 | HR 80 | Temp 98.2°F | Resp 16 | Wt 89.2 lb

## 2022-08-14 DIAGNOSIS — C539 Malignant neoplasm of cervix uteri, unspecified: Secondary | ICD-10-CM | POA: Insufficient documentation

## 2022-08-14 DIAGNOSIS — N131 Hydronephrosis with ureteral stricture, not elsewhere classified: Secondary | ICD-10-CM | POA: Diagnosis not present

## 2022-08-14 DIAGNOSIS — Z7189 Other specified counseling: Secondary | ICD-10-CM

## 2022-08-14 DIAGNOSIS — E039 Hypothyroidism, unspecified: Secondary | ICD-10-CM

## 2022-08-14 DIAGNOSIS — I517 Cardiomegaly: Secondary | ICD-10-CM | POA: Insufficient documentation

## 2022-08-14 DIAGNOSIS — Z5111 Encounter for antineoplastic chemotherapy: Secondary | ICD-10-CM | POA: Insufficient documentation

## 2022-08-14 DIAGNOSIS — I700 Atherosclerosis of aorta: Secondary | ICD-10-CM | POA: Insufficient documentation

## 2022-08-14 DIAGNOSIS — Z79890 Hormone replacement therapy (postmenopausal): Secondary | ICD-10-CM | POA: Diagnosis not present

## 2022-08-14 DIAGNOSIS — K5730 Diverticulosis of large intestine without perforation or abscess without bleeding: Secondary | ICD-10-CM | POA: Insufficient documentation

## 2022-08-14 DIAGNOSIS — E538 Deficiency of other specified B group vitamins: Secondary | ICD-10-CM

## 2022-08-14 DIAGNOSIS — Z79899 Other long term (current) drug therapy: Secondary | ICD-10-CM | POA: Insufficient documentation

## 2022-08-14 DIAGNOSIS — D61818 Other pancytopenia: Secondary | ICD-10-CM

## 2022-08-14 LAB — CBC WITH DIFFERENTIAL (CANCER CENTER ONLY)
Abs Immature Granulocytes: 0.52 10*3/uL — ABNORMAL HIGH (ref 0.00–0.07)
Basophils Absolute: 0.1 10*3/uL (ref 0.0–0.1)
Basophils Relative: 1 %
Eosinophils Absolute: 0.1 10*3/uL (ref 0.0–0.5)
Eosinophils Relative: 1 %
HCT: 28.2 % — ABNORMAL LOW (ref 36.0–46.0)
Hemoglobin: 9.2 g/dL — ABNORMAL LOW (ref 12.0–15.0)
Immature Granulocytes: 6 %
Lymphocytes Relative: 8 %
Lymphs Abs: 0.6 10*3/uL — ABNORMAL LOW (ref 0.7–4.0)
MCH: 31.3 pg (ref 26.0–34.0)
MCHC: 32.6 g/dL (ref 30.0–36.0)
MCV: 95.9 fL (ref 80.0–100.0)
Monocytes Absolute: 1.3 10*3/uL — ABNORMAL HIGH (ref 0.1–1.0)
Monocytes Relative: 16 %
Neutro Abs: 5.5 10*3/uL (ref 1.7–7.7)
Neutrophils Relative %: 68 %
Platelet Count: 236 10*3/uL (ref 150–400)
RBC: 2.94 MIL/uL — ABNORMAL LOW (ref 3.87–5.11)
RDW: 18 % — ABNORMAL HIGH (ref 11.5–15.5)
Smear Review: NORMAL
WBC Count: 8.1 10*3/uL (ref 4.0–10.5)
nRBC: 0 % (ref 0.0–0.2)

## 2022-08-14 LAB — CMP (CANCER CENTER ONLY)
ALT: 6 U/L (ref 0–44)
AST: 16 U/L (ref 15–41)
Albumin: 4.2 g/dL (ref 3.5–5.0)
Alkaline Phosphatase: 49 U/L (ref 38–126)
Anion gap: 7 (ref 5–15)
BUN: 31 mg/dL — ABNORMAL HIGH (ref 8–23)
CO2: 27 mmol/L (ref 22–32)
Calcium: 9.3 mg/dL (ref 8.9–10.3)
Chloride: 104 mmol/L (ref 98–111)
Creatinine: 1.17 mg/dL — ABNORMAL HIGH (ref 0.44–1.00)
GFR, Estimated: 50 mL/min — ABNORMAL LOW (ref >60)
Glucose, Bld: 92 mg/dL (ref 70–99)
Potassium: 4.1 mmol/L (ref 3.5–5.1)
Sodium: 138 mmol/L (ref 135–145)
Total Bilirubin: 0.3 mg/dL (ref 0.3–1.2)
Total Protein: 7.4 g/dL (ref 6.5–8.1)

## 2022-08-14 LAB — TSH: TSH: 2.141 u[IU]/mL (ref 0.350–4.500)

## 2022-08-14 MED ORDER — PROCHLORPERAZINE MALEATE 10 MG PO TABS
10.0000 mg | ORAL_TABLET | Freq: Once | ORAL | Status: AC
  Administered 2022-08-14: 10 mg via ORAL
  Filled 2022-08-14: qty 1

## 2022-08-14 MED ORDER — SODIUM CHLORIDE 0.9 % IV SOLN
800.0000 mg/m2 | Freq: Once | INTRAVENOUS | Status: AC
  Administered 2022-08-14: 1064 mg via INTRAVENOUS
  Filled 2022-08-14: qty 27.98

## 2022-08-14 MED ORDER — CYANOCOBALAMIN 1000 MCG/ML IJ SOLN
1000.0000 ug | Freq: Once | INTRAMUSCULAR | Status: AC
  Administered 2022-08-14: 1000 ug via INTRAMUSCULAR
  Filled 2022-08-14: qty 1

## 2022-08-14 MED ORDER — SODIUM CHLORIDE 0.9% FLUSH
10.0000 mL | Freq: Once | INTRAVENOUS | Status: AC
  Administered 2022-08-14: 10 mL

## 2022-08-14 MED ORDER — SODIUM CHLORIDE 0.9 % IV SOLN: Freq: Once | INTRAVENOUS | Status: AC

## 2022-08-14 MED ORDER — HEPARIN SOD (PORK) LOCK FLUSH 100 UNIT/ML IV SOLN
500.0000 [IU] | Freq: Once | INTRAVENOUS | Status: AC | PRN
  Administered 2022-08-14: 500 [IU]

## 2022-08-14 NOTE — Patient Instructions (Signed)
[facility] MEDICAL ONCOLOGY   Discharge Instructions: Thank you for choosing [facility] to provide your oncology and hematology care.   If you have a lab appointment with the Cancer Center, please go directly to the Cancer Center and check in at the registration area.   Wear comfortable clothing and clothing appropriate for easy access to any Portacath or PICC line.   We strive to give you quality time with your provider. You may need to reschedule your appointment if you arrive late (15 or more minutes).  Arriving late affects you and other patients whose appointments are after yours.  Also, if you miss three or more appointments without notifying the office, you may be dismissed from the clinic at the provider's discretion.      For prescription refill requests, have your pharmacy contact our office and allow 72 hours for refills to be completed.    Today you received the following chemotherapy and/or immunotherapy agents: gemcitabine      To help prevent nausea and vomiting after your treatment, we encourage you to take your nausea medication as directed.  BELOW ARE SYMPTOMS THAT SHOULD BE REPORTED IMMEDIATELY: *FEVER GREATER THAN 100.4 F (38 C) OR HIGHER *CHILLS OR SWEATING *NAUSEA AND VOMITING THAT IS NOT CONTROLLED WITH YOUR NAUSEA MEDICATION *UNUSUAL SHORTNESS OF BREATH *UNUSUAL BRUISING OR BLEEDING *URINARY PROBLEMS (pain or burning when urinating, or frequent urination) *BOWEL PROBLEMS (unusual diarrhea, constipation, pain near the anus) TENDERNESS IN MOUTH AND THROAT WITH OR WITHOUT PRESENCE OF ULCERS (sore throat, sores in mouth, or a toothache) UNUSUAL RASH, SWELLING OR PAIN  UNUSUAL VAGINAL DISCHARGE OR ITCHING   Items with * indicate a potential emergency and should be followed up as soon as possible or go to the Emergency Department if any problems should occur.  Please show the CHEMOTHERAPY ALERT CARD or IMMUNOTHERAPY ALERT CARD at check-in  to the Emergency Department and triage nurse.  Should you have questions after your visit or need to cancel or reschedule your appointment, please contact [facility]ICAL [facility]COL[facility]  Dept: [phone]  and follow the prompts.  Office hours are 8:00 a.m. to 4:30 p.m. Monday - Friday. Please note that voicemails left after 4:00 p.m. may not be returned until the following business day.  We are closed weekends and major holidays. You have access to a nurse at all times for urgent questions. Please call the main number to the clinic Dept: [phone] and follow the prompts.   For any non-urgent questions, you may also contact your provider using MyChart. We now offer e-Visits for anyone 18 and older to request care online for non-urgent symptoms. For details visit [email or web address].   Also download the MyChart app! Go to the app store, search "MyChart", open the app, select [facility], and log in with your MyChart username and password.  Masks are optional in the cancer centers. If you would like for your care team to wear a mask while they are taking care of you, please let them know. You may have one support person who is at least 70 years old accompany you for your appointments. 

## 2022-08-26 ENCOUNTER — Ambulatory Visit (HOSPITAL_COMMUNITY)
Admission: RE | Admit: 2022-08-26 | Discharge: 2022-08-26 | Disposition: A | Discharge status: Home / Self Care | Payer: Medicare Other | Source: Ambulatory Visit | Attending: Hematology and Oncology | Admitting: Hematology and Oncology

## 2022-08-26 DIAGNOSIS — C539 Malignant neoplasm of cervix uteri, unspecified: Secondary | ICD-10-CM | POA: Diagnosis present

## 2022-08-26 DIAGNOSIS — Z7189 Other specified counseling: Secondary | ICD-10-CM | POA: Insufficient documentation

## 2022-08-26 MED ORDER — IOHEXOL 9 MG/ML PO SOLN: 1000.0000 mL | Freq: Once | ORAL | Status: DC

## 2022-08-26 MED ORDER — IOHEXOL 300 MG/ML  SOLN
80.0000 mL | Freq: Once | INTRAMUSCULAR | Status: AC | PRN
  Administered 2022-08-26: 80 mL via INTRAVENOUS

## 2022-08-26 MED ORDER — HEPARIN SOD (PORK) LOCK FLUSH 100 UNIT/ML IV SOLN
500.0000 [IU] | Freq: Once | INTRAVENOUS | Status: AC
  Administered 2022-08-26: 500 [IU] via INTRAVENOUS

## 2022-08-27 ENCOUNTER — Telehealth: Payer: Self-pay

## 2022-08-27 ENCOUNTER — Other Ambulatory Visit: Payer: Self-pay | Admitting: Hematology and Oncology

## 2022-08-27 NOTE — Telephone Encounter (Signed)
Called Pt to discuss appt changes. Relayed that Dr. Alvy Bimler will go over CT at tomorrow's appt and that we have canceled lab/infusion. Spoke with caregiver who confirmed she will come with Pt to discuss next options. Pt verbalized understanding.

## 2022-08-27 NOTE — Progress Notes (Signed)
DISCONTINUE OFF PATHWAY REGIMEN - Other   [id number]:Gemcitabine 1,000 mg/m2 D1, 8  q21 Days:   A cycle is every 21 days:     Gemcitabine   **Always confirm dose/schedule in your pharmacy ordering system**  REASON: Disease Progression PRIOR TREATMENT: Gemcitabine 1,000 mg/m2 D1, 8  q21 Days TREATMENT RESPONSE: Progressive Disease (PD)  START OFF PATHWAY REGIMEN - Other   [id number]:Cemiplimab 350 mg IV D1 q21 Days:   A cycle is every 21 days:     Cemiplimab-rwlc   **Always confirm dose/schedule in your pharmacy ordering system**  Patient Characteristics: Intent of Therapy: Non-Curative / Palliative Intent, Discussed with Patient

## 2022-08-27 NOTE — Telephone Encounter (Signed)
-----   Message from [name], MD sent at 08/27/2022  8:52 AM EST ----- Yes, she should bring her caregiver, [name] along to discuss next option ----- Message ----- From: Rickard Patience, RN Sent: 08/27/2022   8:34 AM EST To: Flo Shanks, RN; [name], MD  Appts canceled, should I call [facility]? ----- Message ----- From: [name], MD Sent: 08/27/2022   8:12 AM EST To: Flo Shanks, RN; Rickard Patience, RN  Her CT showed progression Please cancel her labs/flush tomorrow and chemo

## 2022-08-28 ENCOUNTER — Encounter: Payer: Self-pay | Admitting: Hematology and Oncology

## 2022-08-28 ENCOUNTER — Inpatient Hospital Stay: Payer: Medicare Other

## 2022-08-28 ENCOUNTER — Other Ambulatory Visit: Payer: Self-pay

## 2022-08-28 ENCOUNTER — Inpatient Hospital Stay (HOSPITAL_BASED_OUTPATIENT_CLINIC_OR_DEPARTMENT_OTHER): Payer: Medicare Other | Admitting: Hematology and Oncology

## 2022-08-28 VITALS — BP 116/68 | HR 97 | Temp 99.0°F | Resp 18 | Ht 62.0 in | Wt 88.6 lb

## 2022-08-28 DIAGNOSIS — Z5111 Encounter for antineoplastic chemotherapy: Secondary | ICD-10-CM | POA: Diagnosis not present

## 2022-08-28 DIAGNOSIS — N1330 Unspecified hydronephrosis: Secondary | ICD-10-CM

## 2022-08-28 DIAGNOSIS — C539 Malignant neoplasm of cervix uteri, unspecified: Secondary | ICD-10-CM | POA: Diagnosis not present

## 2022-08-28 DIAGNOSIS — Z7189 Other specified counseling: Secondary | ICD-10-CM

## 2022-08-28 NOTE — Assessment & Plan Note (Signed)
She has compromised renal function due to hydronephrosis but overall asymptomatic I do not recommend stent placement long-term 

## 2022-08-28 NOTE — Progress Notes (Signed)
[facility] OFFICE PROGRESS NOTE  Patient Care Team: Default, Provider, MD as PCP - General  ASSESSMENT & PLAN:  Malignant neoplasm of cervix ([place]) I have reviewed multiple imaging studies with the patient Unfortunately, she continues to progress despite multiple lines of chemotherapy Her prognosis is poor However, the patient desired more treatment We reviewed the guidelines and discussed treatment options  We reviewed the guidelines Treatment intent is strictly palliative The treatment was approved based on the study published   Survival with Cemiplimab in Recurrent Cervical Cancer  Krishnansu S. Jenna Luo, M.D., Theodoro Clock, M.D., Merla Riches, M.D., [place], Ph.D., Jenetta Loges, M.D., Ph.D., Katha Hamming, M.D., Kimberlee Nearing Man Maudie Mercury, M.D., Ph.D., Jules Husbands, M.D., Ph.D., Gibson Ramp, M.D., Ph.D., Boris Sharper, M.D., Ph.D., Winfield Rast, M.D., Lennox Pippins, M.D., Ph.D., et al., for the Investigators for GOG Protocol 0962 and ENGOT Protocol [employer] Med 2022; 836:629-476 DOI: [id number]  BACKGROUND Patients with recurrent cervical cancer have a poor prognosis. Cemiplimab, the fully human programmed cell death 1 (PD-1)-blocking antibody approved to treat lung and skin cancers, has been shown to have preliminary clinical activity in this population.  METHODS In this phase 3 trial, we enrolled patients who had disease progression after first-line platinum-containing chemotherapy, regardless of their programmed cell death ligand 1 (PD-L1) status. Women were randomly assigned (1:1) to receive cemiplimab (350 mg every 3 weeks) or the investigator's choice of single-agent chemotherapy. The primary end point was overall survival. Progression-free survival and safety were also assessed.  RESULTS A total of 608 women were enrolled (304 in each group). In the overall trial population, median overall survival was longer in the  cemiplimab group than in the chemotherapy group (12.0 months vs. 8.5 months; hazard ratio for death, 0.69; 95% confidence interval [CI], 0.56 to 0.84; two-sided P<0.001). The overall survival benefit was consistent in both histologic subgroups (squamous-cell carcinoma and adenocarcinoma [including adenosquamous carcinoma]). Progression-free survival was also longer in the cemiplimab group than in the chemotherapy group in the overall population (hazard ratio for disease progression or death, 0.75; 95% CI, 0.63 to 0.89; two-sided P<0.001). In the overall population, an objective response occurred in 16.4% (95% CI, 12.5 to 21.1) of the patients in the cemiplimab group, as compared with 6.3% (95% CI, 3.8 to 9.6) in the chemotherapy group. An objective response occurred in 18% (95% CI, 11 to 28) of the cemiplimab-treated patients with PD-L1 expression greater than or equal to 1% and in 11% (95% CI, 4 to 25) of those with PD-L1 expression of less than 1%. Overall, grade 3 or higher adverse events occurred in 45.0% of the patients who received cemiplimab and in 53.4% of those who received chemotherapy.  CONCLUSIONS Survival was significantly longer with cemiplimab than with single-agent chemotherapy among patients with recurrent cervical cancer after first-line platinum-containing chemotherapy  We discussed the importance of TSH monitoring After a long discussion about the risk, benefits, side effects of treatment the patient is willing to proceed with plan of care I recommend minimum 3 months of treatment before repeat imaging study   Goals of care, counseling/discussion We discussed goals of care She is aware of low response rates We discussed the role of palliative treatment  Hydronephrosis of right kidney She has compromised renal function due to hydronephrosis but overall asymptomatic I do not recommend stent placement long-term  Orders Placed This Encounter  Procedures   CBC with Differential  ([place] Only)    Standing Status:  Future    Standing Expiration Date:   09/12/2023   CMP ([place] only)    Standing Status:   Future    Standing Expiration Date:   09/12/2023   T4    Standing Status:   Future    Standing Expiration Date:   09/12/2023   TSH    Standing Status:   Future    Standing Expiration Date:   09/12/2023   CBC with Differential ([facility] Only)    Standing Status:   Future    Standing Expiration Date:   10/03/2023   CMP ([facility] only)    Standing Status:   Future    Standing Expiration Date:   10/03/2023   CBC with Differential ([facility]cer Center Only)    Standing Status:   Future    Standing Expiration Date:   10/24/2023   CMP ([facility] only)    Standing Status:   Future    Standing Expiration Date:   10/24/2023   T4    Standing Status:   Future    Standing Expiration Date:   10/24/2023   TSH    Standing Status:   Future    Standing Expiration Date:   10/24/2023   CBC with Differential ([facility] [facility] Only)    Standing Status:   Future    Standing Expiration Date:   11/14/2023   CMP ([facility] only)    Standing Status:   Future    Standing Expiration Date:   11/14/2023   CBC with Differential ([facility] Only)    Standing Status:   Future    Standing Expiration Date:   12/05/2023   CMP ([place] only)    Standing Status:   Future    Standing Expiration Date:   12/05/2023   CBC with Differential ([facility] Center Only)    Standing Status:   Future    Standing Expiration Date:   12/26/2023   CMP ([facility] only)    Standing Status:   Future    Standing Expiration Date:   12/26/2023   T4    Standing Status:   Future    Standing Expiration Date:   12/26/2023   TSH    Standing Status:   Future    Standing Expiration Date:   12/26/2023    All questions were answered. The patient knows to call the clinic with any problems, questions or concerns. The total time spent in the appointment was 40 minutes encounter with patients  including review of chart and various tests results, discussions about plan of care and coordination of care plan   [name], MD 08/28/2022 10:17 AM  INTERVAL HISTORY: Please see below for problem oriented charting. she returns for treatment follow-up with her caregiver She denies pelvic pain or discharge We discussed majority of our time reviewing CT imaging results and treatment options  REVIEW OF SYSTEMS:   Constitutional: Denies fevers, chills or abnormal weight loss Eyes: Denies blurriness of vision Ears, nose, mouth, throat, and face: Denies mucositis or sore throat Respiratory: Denies cough, dyspnea or wheezes Cardiovascular: Denies palpitation, chest discomfort or lower extremity swelling Gastrointestinal:  Denies nausea, heartburn or change in bowel habits Skin: Denies abnormal skin rashes Lymphatics: Denies new lymphadenopathy or easy bruising Neurological:Denies numbness, tingling or new weaknesses Behavioral/Psych: Mood is stable, no new changes  All other systems were reviewed with the patient and are negative.  I have reviewed the past medical history, past surgical history, social history and family history with the  patient and they are unchanged from previous note.  ALLERGIES:  has No Known Allergies.  MEDICATIONS:  Current Outpatient Medications  Medication Sig Dispense Refill   levothyroxine (SYNTHROID) 100 MCG tablet TAKE 1 TABLET(100 MCG) BY MOUTH DAILY BEFORE BREAKFAST 30 tablet 3   lidocaine-prilocaine (EMLA) cream Apply to affected area once 30 g 3   Multiple Vitamin (MULTIVITAMIN) tablet Take 1 tablet by mouth daily.     ondansetron (ZOFRAN) 8 MG tablet Take 1 tablet (8 mg total) by mouth 2 (two) times daily as needed (Nausea or vomiting). 30 tablet 1   prochlorperazine (COMPAZINE) 10 MG tablet Take 1 tablet (10 mg total) by mouth every 6 (six) hours as needed (Nausea or vomiting). 30 tablet 1   No current facility-administered medications for this  visit.   Facility-Administered Medications Ordered in Other Visits  Medication Dose Route Frequency Provider Last Rate Last Admin   diphenhydrAMINE (BENADRYL) 50 MG/ML injection            famotidine (PEPCID) 20-0.9 MG/50ML-% IVPB             SUMMARY OF ONCOLOGIC HISTORY: Oncology History Overview Note  Cancer Staging Malignant neoplasm of cervix ([place]) Staging form: Cervix Uteri, AJCC 8th Edition - Clinical: Stage IIIB (cT3b, cN1, cM0) - Signed by [name], MD on 05/04/2018  PD-L1 1% Progressed on carboplatin, pembrolizumab, cisplatin and taxol, gemzar   Malignant neoplasm of cervix ([place])  03/13/2018 Imaging   US pelvis There are mobile echogenic foci within the endometrial cavity suggestive of gas. This is nonspecific in etiology however may be secondary to endometritis. Correlate for history of recent instrumentation. This obscures visualization of the endometrial tissue and therefore a follow-up pelvic ultrasound in 2-4 weeks is recommended after resolution of the acute symptomatology if symptoms persist to further evaluate the endometrium.     03/13/2018 Initial Diagnosis   She initially presented with PMB   04/12/2018 Pathology Results   Cervix, biopsy, mass - INVASIVE SQUAMOUS CELL CARCINOMA - SEE COMMENT   04/12/2018 Surgery   PREOPERATIVE DIAGNOSIS:  Postmenopausal vaginal bleeding. POSTOPERATIVE DIAGNOSIS: The same PROCEDURE: Exam under anesthesia, pap smear, cervical mass biopsy SURGEON:  Dr. [name]   INDICATIONS: 71 y.o. yo G0P0000 with PMB here for exam under anesthesia.Risks of surgery were discussed with the patient including but not limited to: bleeding which may require transfusion; infection which may require antibiotics; injury to uterus or surrounding organs; need for additional procedures including laparotomy or laparoscopy; and other postoperative/anesthesia complications. Written informed consent was obtained.     FINDINGS:  An 8-week size midline  uterus.  No adnexal mass palpable on exam. Normal cervix not visualized. Friable mass seen in involving the vagina at the level of the cervix obliterating the anterior and posterior fornix.   ANESTHESIA:   General INTRAVENOUS FLUIDS:  300 ml of LR ESTIMATED BLOOD LOSS: 20 ml. SPECIMENS: pap smear, cervical mass biopsy COMPLICATIONS:  None immediate.     04/25/2018 PET scan   1. Large cervical mass may invade into the myometrium in down into the vagina, maximum SUV 21.3. There is a malignant left external iliac node measuring 1.4 cm in short axis with maximum SUV 14.1. 2. Accentuated activity in the cecum and ascending colon is likely physiologic given that it has no CT correlate. Correlation with the patient's colon cancer screening history is recommended. If screening is not up-to-date, appropriate screening should be considered. 3.  Aortic Atherosclerosis (ICD10-I70.0).     04/28/2018 Surgery  Pre-operative Diagnosis:  At least Stage 2B SCCa Cervical Cancer   Post-operative Diagnosis: At least clinical Stage 3A SCCa Cervical Cancer   Operation:  Exam under anesthesia due to intolerance for vaginal exam in office Cystoscopy due to concern for extension to posterior bladder (from anterior vaginal wall lesion)   Operative Findings:   Parametrial involvement on right  Right sided subvaginal tumor burden ~3cm. This approximates the right ureteral insertion into the bladder based on my exam during cystoscopy. Extension of disease down upper half of vagina on lateral and posterior walls. Extension of disease down to lower third of anterior vagina (versus skip lesion) thus making her Stage 3A Cystoscopy revealed patent bilateral ureteral orifices and no bladder mucosa invasion or areas of concern. Rectal exam grossly no disease.     05/04/2018 Cancer Staging   Staging form: Cervix Uteri, AJCC 8th Edition - Clinical: Stage IIIB (cT3b, cN1, cM0) - Signed by [name], MD on 05/04/2018    05/12/2018 Procedure   Successful 8 French right internal jugular vein power port placement with its tip at the SVC/RA junction   05/13/2018 - 06/24/2018 Chemotherapy   The patient had weekly cisplatin given with concurrent radiation    05/17/2018 - 07/27/2018 Radiation Therapy   Radiation treatment dates:   05/17/2018-07/27/2018   Site/dose: 1. Cervix, 1.8 Gy in 25 fractions for a total dose of 45 Gy                    2. Pelvis  Boost, 1.8 Gy in 5 fractions for a total dose of 9 Gy                    3. Cervix, 5.5 Gy in 5 fractions for a total dose of 27.5 Gy   06/03/2018 Adverse Reaction   She missed her chemo due to severe depression and was sent to the ER for suicidal ideation.  She was seen by psychiatrist.   10/27/2018 PET scan   IMPRESSION: 1. Complete metabolic response to therapy, without residual or recurrent hypermetabolic disease. 2.  Aortic Atherosclerosis (ICD10-I70.0).     11/17/2018 Imaging   Successful right IJ vein Port-A-Cath explant.   03/29/2020 PET scan   1. Hypermetabolic lesion along the right vaginal cuff is highly worrisome for recurrent cervical cancer. No evidence of distant metastatic disease. 2.  Aortic atherosclerosis (ICD10-I70.0).   04/26/2020 - 12/10/2020 Chemotherapy   The patient had CARBOplatin  for chemotherapy treatment.      Genetic Testing   Patient has genetic testing done for PD-L1. Results revealed patient has the following:  PD-L1 combined positive score (CPS): 1%   07/08/2020 Imaging   1. Unchanged post treatment appearance of the pelvis, including a soft tissue nodule in the low right hemipelvis adjacent to the vagina measuring 2.8 x 1.7 cm, previously FDG avid and consistent with malignancy. 2. No evidence of nodal or distant metastatic disease in the abdomen or pelvis. 3. Aortic Atherosclerosis (ICD10-I70.0).   10/14/2020 Imaging   1. Unchanged right eccentric soft tissue mass in the low right hemipelvis centered on the cervix and  vagina, measuring 2.9 x 1.9 cm. 2. New small volume fluid within the endometrial cavity, abnormal in the postmenopausal setting and suggesting obstruction of the cervical os by soft tissue mass. 3. No evidence of lymphadenopathy or distant metastatic disease in the abdomen or pelvis.   Aortic Atherosclerosis (ICD10-I70.0).   12/24/2020 Imaging   1. Abnormal hypodense masslike appearance continuous with hypodense  masslike lesion probably centrally within a right eccentric cervix (versus right paracervical mass). The endometrial portion has enlarged compared to 10/14/2020 and active malignancy is suspected. 2. No adenopathy or findings of distant spread. 3. Other imaging findings of potential clinical significance: Aortic Atherosclerosis (ICD10-I70.0). Mild cardiomegaly. Stable scarring in the left lower lobe. Sigmoid colon scattered diverticula. Tarlov cysts at the S2 level. Vertical sclerosis in the sacral ala possibly due to stress fractures or prior radiation therapy.     01/06/2021 - 06/19/2021 Chemotherapy   Patient is on Treatment Plan : UTERINE Pembrolizumab q21d      04/07/2021 Imaging   1. The previously hypermetabolic right vaginal mass currently measures 12 cubic cm in volume, previously 11 cubic cm on 12/23/2020. 2. No new adenopathy or new findings of distant metastatic spread. 3. New wall thickening in several loops of distal ileum suspicious for inflammation/enteritis. 4. Other imaging findings of potential clinical significance: Mild cardiomegaly. Mild sigmoid colon diverticulosis. Stable bony demineralization with vertical sclerosis in the sacral ala likely from old insufficiency fracture.   07/10/2021 Imaging   1. New moderate right-sided hydroureteronephrosis with decreased perfusion to the right kidney compatible with obstructive uropathy.  2. Interval progression of the heterogeneous low-density uterine lesion, with progression of enhancing soft tissue extending inferiorly to  the right, compatible with previously described disease in the right vaginal wall. Represents the site of right ureteral obstruction. 3. No evidence for new metastatic disease in the abdomen or pelvis. 4. Bilateral sacral insufficiency fractures. 5. Trace free fluid in the pelvis. 6. Aortic Atherosclerosis (ICD10-I70.0).     07/22/2021 Procedure   Successful placement of a right internal jugular approach power injectable Port-A-Cath.     07/24/2021 - 10/02/2021 Chemotherapy   Patient is on Treatment Plan : cervical cancer Cisplatin q7d       10/16/2021 Imaging   1. Mild interval increase in size of heterogeneous tumor arising from within the uterus with local tumor extension into the right pelvic sidewall. 2. Persistent right-sided obstructive uropathy with severe right hydronephrosis and progressive right renal cortical volume loss. Right hydroureter extends into the pelvis where it is obstructed by the uterine/cervical mass. 3. Bilateral sacral insufficiency fractures. 4. Aortic Atherosclerosis (ICD10-I70.0).   11/06/2021 - 05/15/2022 Chemotherapy   Patient is on Treatment Plan : Cervical Paclitaxel q7d     02/10/2022 Imaging   1. Slight interval decrease in size of a heterogeneous mass within the right pelvis, involving the adjacent right aspect of the uterus and closely abutting the posterior urinary bladder,, consistent with treatment response. Fat stranding and presacral soft tissue thickening in the adjacent pelvis, unchanged. 2. Unchanged severe right hydronephrosis and hydroureter, the distal right ureter obstructed by mass in the pelvis, again with slight progression of right renal cortical atrophy. 3. Unchanged, mildly sclerotic nondisplaced bilateral sacral insufficiency fractures.   Aortic Atherosclerosis (ICD10-I70.0).   05/28/2022 Imaging   1. Slight interval increase in size of the lower uterine/cervical mass. 2. Persistent severe right-sided hydroureteronephrosis down to the  level of the uterine/cervical mass. 3. Stable right-sided posterior bladder wall thickening. 4. No findings for metastatic disease involving the abdomen/pelvis.   Aortic Atherosclerosis (ICD10-I70.0).   05/29/2022 - 05/29/2022 Chemotherapy   Patient is on Treatment Plan : OVARIAN Paclitaxel (80) Y5,6,38,93 q28d     06/12/2022 - 08/14/2022 Chemotherapy   Patient is on Treatment Plan : Cervix Gemcitabine D1,8 (1000) q21d     08/27/2022 Imaging   1. Progressive enlargement of ill-defined mass involving the lower uterus  and cervix with probable progressive parametrial extension on the right causing chronic right ureteral obstruction. Adjacent posterior bladder wall thickening as before, potentially due to direct extension. 2. No evidence of distant metastatic disease or adenopathy. 3. Stable chronic right-sided hydronephrosis with marked right renal cortical thinning. 4. Probable chronic sacral insufficiency fractures and/or sequela of prior radiation therapy. 5.  Aortic Atherosclerosis (ICD10-I70.0).   09/11/2022 -  Chemotherapy   Patient is on Treatment Plan : cervical cancer Cemiplimab q21d       PHYSICAL EXAMINATION: ECOG PERFORMANCE STATUS: 1 - Symptomatic but completely ambulatory  Vitals:   08/28/22 0810  BP: 116/68  Pulse: 97  Resp: 18  Temp: 99 F (37.2 C)  SpO2: 99%   Filed Weights   08/28/22 0810  Weight: 88 lb 9.6 oz (40.2 kg)    GENERAL:alert, no distress and comfortable  NEURO: alert & oriented x 3 with fluent speech, no focal motor/sensory deficits  LABORATORY DATA:  I have reviewed the data as listed    Component Value Date/Time   NA 138 08/14/2022 1005   K 4.1 08/14/2022 1005   CL 104 08/14/2022 1005   CO2 27 08/14/2022 1005   GLUCOSE 92 08/14/2022 1005   BUN 31 (H) 08/14/2022 1005   CREATININE 1.17 (H) 08/14/2022 1005   CALCIUM 9.3 08/14/2022 1005   PROT 7.4 08/14/2022 1005   ALBUMIN 4.2 08/14/2022 1005   AST 16 08/14/2022 1005   ALT 6 08/14/2022  1005   ALKPHOS 49 08/14/2022 1005   BILITOT 0.3 08/14/2022 1005   GFRNONAA 50 (L) 08/14/2022 1005   GFRAA >60 06/14/2020 0844   GFRAA >60 03/11/2020 0951    No results found for: "SPEP", "UPEP"  Lab Results  Component Value Date   WBC 8.1 08/14/2022   NEUTROABS 5.5 08/14/2022   HGB 9.2 (L) 08/14/2022   HCT 28.2 (L) 08/14/2022   MCV 95.9 08/14/2022   PLT 236 08/14/2022      Chemistry      Component Value Date/Time   NA 138 08/14/2022 1005   K 4.1 08/14/2022 1005   CL 104 08/14/2022 1005   CO2 27 08/14/2022 1005   BUN 31 (H) 08/14/2022 1005   CREATININE 1.17 (H) 08/14/2022 1005      Component Value Date/Time   CALCIUM 9.3 08/14/2022 1005   ALKPHOS 49 08/14/2022 1005   AST 16 08/14/2022 1005   ALT 6 08/14/2022 1005   BILITOT 0.3 08/14/2022 1005       RADIOGRAPHIC STUDIES: I have reviewed multiple imaging studies with the patient I have personally reviewed the radiological images as listed and agreed with the findings in the report. CT ABDOMEN PELVIS W CONTRAST  Result Date: 08/26/2022 CLINICAL DATA:  History of cervical cancer. Assess treatment response. * Tracking Code: BO * EXAM: CT ABDOMEN AND PELVIS WITH CONTRAST TECHNIQUE: Multidetector CT imaging of the abdomen and pelvis was performed using the standard protocol following bolus administration of intravenous contrast. RADIATION DOSE REDUCTION: This exam was performed according to the departmental dose-optimization program which includes automated exposure control, adjustment of the mA and/or kV according to patient size and/or use of iterative reconstruction technique. CONTRAST:  71m OMNIPAQUE IOHEXOL 300 MG/ML  SOLN COMPARISON:  Prior CTs 05/27/2022 and 02/05/2022. PET-CT 03/29/2020. FINDINGS: Lower chest: Stable linear scarring at both lung bases. No significant pleural or pericardial effusion. Hepatobiliary: The liver is normal in density without suspicious focal abnormality. No evidence of gallstones, gallbladder  wall thickening or biliary dilatation. Pancreas: Unremarkable.  No pancreatic ductal dilatation or surrounding inflammatory changes. Spleen: Normal in size without focal abnormality. Adrenals/Urinary Tract: Both adrenal glands appear normal. Stable chronic right-sided hydronephrosis with marked right renal cortical thinning and absent contrast excretion due to chronic distal right ureteral obstruction. No significant findings within the left kidney or left ureter. Stable tiny left renal cysts which require no imaging follow-up. Bladder wall thickening again noted posteriorly in the right adjacent to the cervical mass, similar to previous studies. Cannot exclude bladder wall involvement. Stomach/Bowel: Enteric contrast was administered and has passed into the mid colon. The stomach appears unremarkable for its degree of distension. No evidence of bowel wall thickening, distention or surrounding inflammatory change. The appendix appears normal. There is moderate stool throughout the colon. Diverticular changes are present within the sigmoid colon. Vascular/Lymphatic: There are no enlarged abdominal or pelvic lymph nodes. Mild aortic and branch vessel atherosclerosis without evidence of aneurysm or large vessel occlusion. The portal, superior mesenteric and splenic veins appear patent. Reproductive: Ill-defined mass involving the lower uterus and cervix demonstrates progressive enlargement, measuring up to 5.2 x 3.6 cm transverse on image 59/2 (previously 5.2 x 3.5 cm). On the sagittal images, this extends up to 7.6 cm in length (image 74/5), compared with 6.9 cm previously. Probable progressive parametrial extension on the right causing chronic right ureteral obstruction. Both ovaries appear stable. No suspicious adnexal findings. Other: No ascites or peritoneal nodularity. Musculoskeletal: Probable chronic sacral insufficiency fractures and/or sequela of prior radiation therapy. No acute fracture or lytic lesion  identified. Chronic Schmorl's node involving the superior endplate of L5 and sacral Tarlov cysts. IMPRESSION: 1. Progressive enlargement of ill-defined mass involving the lower uterus and cervix with probable progressive parametrial extension on the right causing chronic right ureteral obstruction. Adjacent posterior bladder wall thickening as before, potentially due to direct extension. 2. No evidence of distant metastatic disease or adenopathy. 3. Stable chronic right-sided hydronephrosis with marked right renal cortical thinning. 4. Probable chronic sacral insufficiency fractures and/or sequela of prior radiation therapy. 5.  Aortic Atherosclerosis (ICD10-I70.0). Electronically Signed   By: Richardean Sale M.D.   On: 08/26/2022 16:10

## 2022-08-28 NOTE — Assessment & Plan Note (Signed)
We discussed goals of care She is aware of low response rates We discussed the role of palliative treatment

## 2022-08-28 NOTE — Assessment & Plan Note (Signed)
I have reviewed multiple imaging studies with the patient Unfortunately, she continues to progress despite multiple lines of chemotherapy Her prognosis is poor However, the patient desired more treatment We reviewed the guidelines and discussed treatment options  We reviewed the guidelines Treatment intent is strictly palliative The treatment was approved based on the study published   Survival with Cemiplimab in Recurrent Cervical Cancer  Krishnansu S. Jenna Luo, M.D., Theodoro Clock, M.D., Merla Riches, M.D., [place], Ph.D., Jenetta Loges, M.D., Ph.D., Katha Hamming, M.D., Kimberlee Nearing Man Maudie Mercury, M.D., Ph.D., Jules Husbands, M.D., Ph.D., Gibson Ramp, M.D., Ph.D., Boris Sharper, M.D., Ph.D., Winfield Rast, M.D., Lennox Pippins, M.D., Ph.D., et al., for the Investigators for GOG Protocol 5320 and ENGOT Protocol [employer] 2022; 233:435-686 DOI: 10.1056/[id number]  BACKGROUND Patients with recurrent cervical cancer have a poor prognosis. Cemiplimab, the fully human programmed cell death 1 (PD-1)-blocking antibody approved to treat lung and skin cancers, has been shown to have preliminary clinical activity in this population.  METHODS In this phase 3 trial, we enrolled patients who had disease progression after first-line platinum-containing chemotherapy, regardless of their programmed cell death ligand 1 (PD-L1) status. Women were randomly assigned (1:1) to receive cemiplimab (350 mg every 3 weeks) or the investigator's choice of single-agent chemotherapy. The primary end point was overall survival. Progression-free survival and safety were also assessed.  RESULTS A total of 608 women were enrolled (304 in each group). In the overall trial population, median overall survival was longer in the cemiplimab group than in the chemotherapy group (12.0 months vs. 8.5 months; hazard ratio for death, 0.69; 95% confidence interval [CI], 0.56 to 0.84; two-sided P<0.001). The  overall survival benefit was consistent in both histologic subgroups (squamous-cell carcinoma and adenocarcinoma [including adenosquamous carcinoma]). Progression-free survival was also longer in the cemiplimab group than in the chemotherapy group in the overall population (hazard ratio for disease progression or death, 0.75; 95% CI, 0.63 to 0.89; two-sided P<0.001). In the overall population, an objective response occurred in 16.4% (95% CI, 12.5 to 21.1) of the patients in the cemiplimab group, as compared with 6.3% (95% CI, 3.8 to 9.6) in the chemotherapy group. An objective response occurred in 18% (95% CI, 11 to 28) of the cemiplimab-treated patients with PD-L1 expression greater than or equal to 1% and in 11% (95% CI, 4 to 25) of those with PD-L1 expression of less than 1%. Overall, grade 3 or higher adverse events occurred in 45.0% of the patients who received cemiplimab and in 53.4% of those who received chemotherapy.  CONCLUSIONS Survival was significantly longer with cemiplimab than with single-agent chemotherapy among patients with recurrent cervical cancer after first-line platinum-containing chemotherapy  We discussed the importance of TSH monitoring After a long discussion about the risk, benefits, side effects of treatment the patient is willing to proceed with plan of care I recommend minimum 3 months of treatment before repeat imaging study

## 2022-08-30 ENCOUNTER — Other Ambulatory Visit: Payer: Self-pay

## 2022-09-01 ENCOUNTER — Other Ambulatory Visit: Payer: Self-pay

## 2022-09-02 ENCOUNTER — Other Ambulatory Visit: Payer: Self-pay

## 2022-09-03 NOTE — Progress Notes (Signed)
Pharmacist Chemotherapy Monitoring - Initial Assessment    Anticipated start date: 09/11/2022   The following has been reviewed per standard work regarding the patient's treatment regimen: The patient's diagnosis, treatment plan and drug doses, and organ/hematologic function Lab orders and baseline tests specific to treatment regimen  The treatment plan start date, drug sequencing, and pre-medications Prior authorization status  Patient's documented medication list, including drug-drug interaction screen and prescriptions for anti-emetics and supportive care specific to the treatment regimen The drug concentrations, fluid compatibility, administration routes, and timing of the medications to be used The patient's access for treatment and lifetime cumulative dose history, if applicable  The patient's medication allergies and previous infusion related reactions, if applicable   Changes made to treatment plan:  N/A  Follow up needed:  Pending authorization for treatment    Karmen Stabs, [place], 09/03/2022  8:22 AM

## 2022-09-04 ENCOUNTER — Inpatient Hospital Stay: Payer: Medicare Other

## 2022-09-04 ENCOUNTER — Other Ambulatory Visit: Payer: Self-pay

## 2022-09-10 ENCOUNTER — Other Ambulatory Visit: Payer: Self-pay

## 2022-09-11 ENCOUNTER — Other Ambulatory Visit: Payer: Self-pay

## 2022-09-11 ENCOUNTER — Inpatient Hospital Stay: Payer: Medicare Other

## 2022-09-11 VITALS — BP 107/57 | HR 77 | Temp 98.8°F | Resp 18 | Wt 86.0 lb

## 2022-09-11 DIAGNOSIS — C539 Malignant neoplasm of cervix uteri, unspecified: Secondary | ICD-10-CM

## 2022-09-11 DIAGNOSIS — D61818 Other pancytopenia: Secondary | ICD-10-CM

## 2022-09-11 DIAGNOSIS — Z5111 Encounter for antineoplastic chemotherapy: Secondary | ICD-10-CM | POA: Diagnosis not present

## 2022-09-11 DIAGNOSIS — Z7189 Other specified counseling: Secondary | ICD-10-CM

## 2022-09-11 DIAGNOSIS — E538 Deficiency of other specified B group vitamins: Secondary | ICD-10-CM

## 2022-09-11 LAB — CBC WITH DIFFERENTIAL (CANCER CENTER ONLY)
Abs Immature Granulocytes: 0.09 10*3/uL — ABNORMAL HIGH (ref 0.00–0.07)
Basophils Absolute: 0 10*3/uL (ref 0.0–0.1)
Basophils Relative: 1 %
Eosinophils Absolute: 0 10*3/uL (ref 0.0–0.5)
Eosinophils Relative: 0 %
HCT: 31.2 % — ABNORMAL LOW (ref 36.0–46.0)
Hemoglobin: 10 g/dL — ABNORMAL LOW (ref 12.0–15.0)
Immature Granulocytes: 2 %
Lymphocytes Relative: 12 %
Lymphs Abs: 0.7 10*3/uL (ref 0.7–4.0)
MCH: 30.2 pg (ref 26.0–34.0)
MCHC: 32.1 g/dL (ref 30.0–36.0)
MCV: 94.3 fL (ref 80.0–100.0)
Monocytes Absolute: 1.1 10*3/uL — ABNORMAL HIGH (ref 0.1–1.0)
Monocytes Relative: 20 %
Neutro Abs: 3.6 10*3/uL (ref 1.7–7.7)
Neutrophils Relative %: 65 %
Platelet Count: 190 10*3/uL (ref 150–400)
RBC: 3.31 MIL/uL — ABNORMAL LOW (ref 3.87–5.11)
RDW: 16.8 % — ABNORMAL HIGH (ref 11.5–15.5)
WBC Count: 5.5 10*3/uL (ref 4.0–10.5)
nRBC: 0 % (ref 0.0–0.2)

## 2022-09-11 LAB — CMP (CANCER CENTER ONLY)
ALT: 10 U/L (ref 0–44)
AST: 22 U/L (ref 15–41)
Albumin: 4 g/dL (ref 3.5–5.0)
Alkaline Phosphatase: 44 U/L (ref 38–126)
Anion gap: 10 (ref 5–15)
BUN: 27 mg/dL — ABNORMAL HIGH (ref 8–23)
CO2: 26 mmol/L (ref 22–32)
Calcium: 9 mg/dL (ref 8.9–10.3)
Chloride: 99 mmol/L (ref 98–111)
Creatinine: 1.22 mg/dL — ABNORMAL HIGH (ref 0.44–1.00)
GFR, Estimated: 48 mL/min — ABNORMAL LOW (ref >60)
Glucose, Bld: 108 mg/dL — ABNORMAL HIGH (ref 70–99)
Potassium: 4.1 mmol/L (ref 3.5–5.1)
Sodium: 135 mmol/L (ref 135–145)
Total Bilirubin: 0.3 mg/dL (ref 0.3–1.2)
Total Protein: 7.3 g/dL (ref 6.5–8.1)

## 2022-09-11 LAB — TSH: TSH: 0.255 u[IU]/mL — ABNORMAL LOW (ref 0.350–4.500)

## 2022-09-11 MED ORDER — SODIUM CHLORIDE 0.9 % IV SOLN
350.0000 mg | Freq: Once | INTRAVENOUS | Status: AC
  Administered 2022-09-11: 350 mg via INTRAVENOUS
  Filled 2022-09-11: qty 7

## 2022-09-11 MED ORDER — CYANOCOBALAMIN 1000 MCG/ML IJ SOLN
1000.0000 ug | Freq: Once | INTRAMUSCULAR | Status: AC
  Administered 2022-09-11: 1000 ug via INTRAMUSCULAR
  Filled 2022-09-11: qty 1

## 2022-09-11 MED ORDER — SODIUM CHLORIDE 0.9 % IV SOLN: Freq: Once | INTRAVENOUS | Status: AC

## 2022-09-11 MED ORDER — SODIUM CHLORIDE 0.9% FLUSH
10.0000 mL | Freq: Once | INTRAVENOUS | Status: AC
  Administered 2022-09-11: 10 mL

## 2022-09-11 MED ORDER — HEPARIN SOD (PORK) LOCK FLUSH 100 UNIT/ML IV SOLN
500.0000 [IU] | Freq: Once | INTRAVENOUS | Status: AC | PRN
  Administered 2022-09-11: 500 [IU]

## 2022-09-11 MED ORDER — SODIUM CHLORIDE 0.9% FLUSH
10.0000 mL | INTRAVENOUS | Status: DC | PRN
  Administered 2022-09-11: 10 mL

## 2022-09-11 NOTE — Patient Instructions (Signed)
[facility]  Discharge Instructions: Thank you for choosing [facility] to provide your oncology and hematology care.   If you have a lab appointment with the [facility], please go directly to the [facility] and check in at the registration area.   Wear comfortable clothing and clothing appropriate for easy access to any Portacath or PICC line.   We strive to give you quality time with your provider. You may need to reschedule your appointment if you arrive late (15 or more minutes).  Arriving late affects you and other patients whose appointments are after yours.  Also, if you miss three or more appointments without notifying the office, you may be dismissed from the clinic at the provider's discretion.      For prescription refill requests, have your pharmacy contact our office and allow 72 hours for refills to be completed.    Today you received the following chemotherapy and/or immunotherapy agents; Cemiplimab-rwlc (Libtayo)       To help prevent nausea and vomiting after your treatment, we encourage you to take your nausea medication as directed.  BELOW ARE SYMPTOMS THAT SHOULD BE REPORTED IMMEDIATELY: *FEVER GREATER THAN 100.4 F (38 C) OR HIGHER *CHILLS OR SWEATING *NAUSEA AND VOMITING THAT IS NOT CONTROLLED WITH YOUR NAUSEA MEDICATION *UNUSUAL SHORTNESS OF BREATH *UNUSUAL BRUISING OR BLEEDING *URINARY PROBLEMS (pain or burning when urinating, or frequent urination) *BOWEL PROBLEMS (unusual diarrhea, constipation, pain near the anus) TENDERNESS IN MOUTH AND THROAT WITH OR WITHOUT PRESENCE OF ULCERS (sore throat, sores in mouth, or a toothache) UNUSUAL RASH, SWELLING OR PAIN  UNUSUAL VAGINAL DISCHARGE OR ITCHING   Items with * indicate a potential emergency and should be followed up as soon as possible or go to the Emergency Department if any problems should occur.  Please show the CHEMOTHERAPY ALERT CARD or IMMUNOTHERAPY ALERT  CARD at check-in to the Emergency Department and triage nurse.  Should you have questions after your visit or need to cancel or reschedule your appointment, please contact [facility]  Dept: [phone]  and follow the prompts.  Office hours are 8:00 a.m. to 4:30 p.m. Monday - Friday. Please note that voicemails left after 4:00 p.m. may not be returned until the following business day.  We are closed weekends and major holidays. You have access to a nurse at all times for urgent questions. Please call the main number to the clinic Dept: [phone] and follow the prompts.   For any non-urgent questions, you may also contact your provider using MyChart. We now offer e-Visits for anyone 2 and older to request care online for non-urgent symptoms. For details visit [email or web address].   Also download the MyChart app! Go to the app store, search "MyChart", open the app, select [facility], and log in with your MyChart username and password.

## 2022-09-12 LAB — T4: T4, Total: 12.2 ug/dL — ABNORMAL HIGH (ref 4.5–12.0)

## 2022-09-15 ENCOUNTER — Telehealth: Payer: Self-pay | Admitting: *Deleted

## 2022-09-15 NOTE — Telephone Encounter (Signed)
Called pt to see how she did with her new treatment.  She reports doing well & denies any side effects.  Reminded of next appt & to call if questions or concerns.

## 2022-09-15 NOTE — Telephone Encounter (Signed)
-----   Message from Daphane Shepherd, RN sent at 09/11/2022  4:20 PM EST ----- Regarding: First time Libtayo Patient of Dr. Alvy Bimler, first time libtayo today. Has had different chemos in the past. Tolerated treatment well.

## 2022-09-16 ENCOUNTER — Other Ambulatory Visit: Payer: Self-pay | Admitting: Hematology and Oncology

## 2022-09-16 ENCOUNTER — Other Ambulatory Visit: Payer: Self-pay

## 2022-09-16 ENCOUNTER — Telehealth: Payer: Self-pay

## 2022-09-16 MED ORDER — LEVOTHYROXINE SODIUM 75 MCG PO TABS: 75.0000 ug | ORAL_TABLET | Freq: Every day | ORAL | 1 refills | Status: DC

## 2022-09-16 MED ORDER — LEVOTHYROXINE SODIUM 50 MCG PO TABS: 50.0000 ug | ORAL_TABLET | Freq: Every day | ORAL | 1 refills | Status: DC

## 2022-09-16 NOTE — Telephone Encounter (Signed)
Called and given below message. She verbalized understanding. Rx sent to preferred pharmacy. 

## 2022-09-16 NOTE — Telephone Encounter (Signed)
-----   Message from [name], MD sent at 09/16/2022  8:14 AM EST ----- Her TSH last week was abnormal She needs to reduce synthroid to 75 mcg If she does not have that at home, please e-scribe 30 tabs with 1 refill

## 2022-09-22 ENCOUNTER — Other Ambulatory Visit: Payer: Self-pay

## 2022-10-02 ENCOUNTER — Inpatient Hospital Stay: Payer: Medicare Other

## 2022-10-02 ENCOUNTER — Encounter: Payer: Self-pay | Admitting: Hematology and Oncology

## 2022-10-02 ENCOUNTER — Inpatient Hospital Stay: Payer: Medicare Other | Attending: Hematology and Oncology | Admitting: Hematology and Oncology

## 2022-10-02 ENCOUNTER — Other Ambulatory Visit: Payer: Self-pay

## 2022-10-02 VITALS — BP 130/79 | HR 93 | Temp 97.7°F | Resp 18 | Ht 62.0 in | Wt 86.2 lb

## 2022-10-02 DIAGNOSIS — Z7189 Other specified counseling: Secondary | ICD-10-CM | POA: Diagnosis not present

## 2022-10-02 DIAGNOSIS — N1330 Unspecified hydronephrosis: Secondary | ICD-10-CM | POA: Insufficient documentation

## 2022-10-02 DIAGNOSIS — T451X5A Adverse effect of antineoplastic and immunosuppressive drugs, initial encounter: Secondary | ICD-10-CM | POA: Insufficient documentation

## 2022-10-02 DIAGNOSIS — Z79899 Other long term (current) drug therapy: Secondary | ICD-10-CM | POA: Insufficient documentation

## 2022-10-02 DIAGNOSIS — E039 Hypothyroidism, unspecified: Secondary | ICD-10-CM | POA: Diagnosis not present

## 2022-10-02 DIAGNOSIS — Z5112 Encounter for antineoplastic immunotherapy: Secondary | ICD-10-CM | POA: Insufficient documentation

## 2022-10-02 DIAGNOSIS — E538 Deficiency of other specified B group vitamins: Secondary | ICD-10-CM

## 2022-10-02 DIAGNOSIS — Z923 Personal history of irradiation: Secondary | ICD-10-CM | POA: Diagnosis not present

## 2022-10-02 DIAGNOSIS — C539 Malignant neoplasm of cervix uteri, unspecified: Secondary | ICD-10-CM

## 2022-10-02 DIAGNOSIS — D6481 Anemia due to antineoplastic chemotherapy: Secondary | ICD-10-CM | POA: Diagnosis not present

## 2022-10-02 DIAGNOSIS — D61818 Other pancytopenia: Secondary | ICD-10-CM

## 2022-10-02 LAB — CBC WITH DIFFERENTIAL (CANCER CENTER ONLY)
Abs Immature Granulocytes: 0.05 10*3/uL (ref 0.00–0.07)
Basophils Absolute: 0 10*3/uL (ref 0.0–0.1)
Basophils Relative: 1 %
Eosinophils Absolute: 0.1 10*3/uL (ref 0.0–0.5)
Eosinophils Relative: 1 %
HCT: 31.6 % — ABNORMAL LOW (ref 36.0–46.0)
Hemoglobin: 10.4 g/dL — ABNORMAL LOW (ref 12.0–15.0)
Immature Granulocytes: 1 %
Lymphocytes Relative: 11 %
Lymphs Abs: 0.7 10*3/uL (ref 0.7–4.0)
MCH: 30.4 pg (ref 26.0–34.0)
MCHC: 32.9 g/dL (ref 30.0–36.0)
MCV: 92.4 fL (ref 80.0–100.0)
Monocytes Absolute: 0.8 10*3/uL (ref 0.1–1.0)
Monocytes Relative: 12 %
Neutro Abs: 4.6 10*3/uL (ref 1.7–7.7)
Neutrophils Relative %: 74 %
Platelet Count: 228 10*3/uL (ref 150–400)
RBC: 3.42 MIL/uL — ABNORMAL LOW (ref 3.87–5.11)
RDW: 16.1 % — ABNORMAL HIGH (ref 11.5–15.5)
WBC Count: 6.2 10*3/uL (ref 4.0–10.5)
nRBC: 0 % (ref 0.0–0.2)

## 2022-10-02 LAB — CMP (CANCER CENTER ONLY)
ALT: 5 U/L (ref 0–44)
AST: 14 U/L — ABNORMAL LOW (ref 15–41)
Albumin: 3.9 g/dL (ref 3.5–5.0)
Alkaline Phosphatase: 42 U/L (ref 38–126)
Anion gap: 7 (ref 5–15)
BUN: 24 mg/dL — ABNORMAL HIGH (ref 8–23)
CO2: 27 mmol/L (ref 22–32)
Calcium: 9.4 mg/dL (ref 8.9–10.3)
Chloride: 107 mmol/L (ref 98–111)
Creatinine: 1.18 mg/dL — ABNORMAL HIGH (ref 0.44–1.00)
GFR, Estimated: 50 mL/min — ABNORMAL LOW (ref >60)
Glucose, Bld: 80 mg/dL (ref 70–99)
Potassium: 4 mmol/L (ref 3.5–5.1)
Sodium: 141 mmol/L (ref 135–145)
Total Bilirubin: 0.4 mg/dL (ref 0.3–1.2)
Total Protein: 7.2 g/dL (ref 6.5–8.1)

## 2022-10-02 LAB — TSH: TSH: 1.138 u[IU]/mL (ref 0.350–4.500)

## 2022-10-02 MED ORDER — HEPARIN SOD (PORK) LOCK FLUSH 100 UNIT/ML IV SOLN
500.0000 [IU] | Freq: Once | INTRAVENOUS | Status: AC | PRN
  Administered 2022-10-02: 500 [IU]

## 2022-10-02 MED ORDER — SODIUM CHLORIDE 0.9 % IV SOLN: Freq: Once | INTRAVENOUS | Status: AC

## 2022-10-02 MED ORDER — SODIUM CHLORIDE 0.9% FLUSH
10.0000 mL | INTRAVENOUS | Status: DC | PRN
  Administered 2022-10-02: 10 mL

## 2022-10-02 MED ORDER — SODIUM CHLORIDE 0.9% FLUSH
10.0000 mL | Freq: Once | INTRAVENOUS | Status: AC
  Administered 2022-10-02: 10 mL

## 2022-10-02 MED ORDER — SODIUM CHLORIDE 0.9 % IV SOLN
350.0000 mg | Freq: Once | INTRAVENOUS | Status: AC
  Administered 2022-10-02: 350 mg via INTRAVENOUS
  Filled 2022-10-02: qty 7

## 2022-10-02 NOTE — Progress Notes (Signed)
[facility] OFFICE PROGRESS NOTE  Patient Care Team: Default, Provider, MD as PCP - General  ASSESSMENT & PLAN:  Malignant neoplasm of cervix ([place]) So far, she tolerated treatment well without side effects The only abnormalities noted on her recent blood work is abnormal thyroid function, persistent anemia and elevated creatinine which are stable Will proceed with treatment without delay Plan to repeat imaging study after cycle 4 therapy  Hydronephrosis of right kidney She has compromised renal function due to hydronephrosis but overall asymptomatic I do not recommend stent placement long-term  Anemia due to antineoplastic chemotherapy This is likely due to recent treatment. The patient denies recent history of bleeding such as epistaxis, hematuria or hematochezia. She is asymptomatic from the anemia. I will observe for now.    Acquired hypothyroidism She has intermittent abnormal TSH I will adjust the dose of her Synthroid accordingly The dose of her Synthroid currently is at 75 mcg She has some 50 mcg at home We will call her with test results today  No orders of the defined types were placed in this encounter.   All questions were answered. The patient knows to call the clinic with any problems, questions or concerns. The total time spent in the appointment was 20 minutes encounter with patients including review of chart and various tests results, discussions about plan of care and coordination of care plan   [name], MD 10/02/2022 8:57 AM  INTERVAL HISTORY: Please see below for problem oriented charting. she returns for treatment follow-up on treatment for recurrent cervical cancer She tolerated treatment well Denies side effects No pelvic pain, abnormal bleeding or discharge  REVIEW OF SYSTEMS:   Constitutional: Denies fevers, chills or abnormal weight loss Eyes: Denies blurriness of vision Ears, nose, mouth, throat, and face: Denies mucositis or  sore throat Respiratory: Denies cough, dyspnea or wheezes Cardiovascular: Denies palpitation, chest discomfort or lower extremity swelling Gastrointestinal:  Denies nausea, heartburn or change in bowel habits Skin: Denies abnormal skin rashes Lymphatics: Denies new lymphadenopathy or easy bruising Neurological:Denies numbness, tingling or new weaknesses Behavioral/Psych: Mood is stable, no new changes  All other systems were reviewed with the patient and are negative.  I have reviewed the past medical history, past surgical history, social history and family history with the patient and they are unchanged from previous note.  ALLERGIES:  has No Known Allergies.  MEDICATIONS:  Current Outpatient Medications  Medication Sig Dispense Refill   levothyroxine (SYNTHROID) 75 MCG tablet Take 1 tablet (75 mcg total) by mouth daily before breakfast. 30 tablet 1   lidocaine-prilocaine (EMLA) cream Apply to affected area once 30 g 3   Multiple Vitamin (MULTIVITAMIN) tablet Take 1 tablet by mouth daily.     ondansetron (ZOFRAN) 8 MG tablet Take 1 tablet (8 mg total) by mouth 2 (two) times daily as needed (Nausea or vomiting). 30 tablet 1   prochlorperazine (COMPAZINE) 10 MG tablet Take 1 tablet (10 mg total) by mouth every 6 (six) hours as needed (Nausea or vomiting). 30 tablet 1   No current facility-administered medications for this visit.   Facility-Administered Medications Ordered in Other Visits  Medication Dose Route Frequency Provider Last Rate Last Admin   diphenhydrAMINE (BENADRYL) 50 MG/ML injection            famotidine (PEPCID) 20-0.9 MG/50ML-% IVPB             SUMMARY OF ONCOLOGIC HISTORY: Oncology History Overview Note  Cancer Staging Malignant neoplasm of cervix ([place]) Staging  form: Cervix Uteri, AJCC 8th Edition - Clinical: Stage IIIB (cT3b, cN1, cM0) - Signed by [name], MD on 05/04/2018  PD-L1 1% Progressed on carboplatin, pembrolizumab, cisplatin and taxol, gemzar    Malignant neoplasm of cervix ([place])  03/13/2018 Imaging   US pelvis There are mobile echogenic foci within the endometrial cavity suggestive of gas. This is nonspecific in etiology however may be secondary to endometritis. Correlate for history of recent instrumentation. This obscures visualization of the endometrial tissue and therefore a follow-up pelvic ultrasound in 2-4 weeks is recommended after resolution of the acute symptomatology if symptoms persist to further evaluate the endometrium.     03/13/2018 Initial Diagnosis   She initially presented with PMB   04/12/2018 Pathology Results   Cervix, biopsy, mass - INVASIVE SQUAMOUS CELL CARCINOMA - SEE COMMENT   04/12/2018 Surgery   PREOPERATIVE DIAGNOSIS:  Postmenopausal vaginal bleeding. POSTOPERATIVE DIAGNOSIS: The same PROCEDURE: Exam under anesthesia, pap smear, cervical mass biopsy SURGEON:  Dr. [name]   INDICATIONS: 72 y.o. yo G0P0000 with PMB here for exam under anesthesia.Risks of surgery were discussed with the patient including but not limited to: bleeding which may require transfusion; infection which may require antibiotics; injury to uterus or surrounding organs; need for additional procedures including laparotomy or laparoscopy; and other postoperative/anesthesia complications. Written informed consent was obtained.     FINDINGS:  An 8-week size midline uterus.  No adnexal mass palpable on exam. Normal cervix not visualized. Friable mass seen in involving the vagina at the level of the cervix obliterating the anterior and posterior fornix.   ANESTHESIA:   General INTRAVENOUS FLUIDS:  300 ml of LR ESTIMATED BLOOD LOSS: 20 ml. SPECIMENS: pap smear, cervical mass biopsy COMPLICATIONS:  None immediate.     04/25/2018 PET scan   1. Large cervical mass may invade into the myometrium in down into the vagina, maximum SUV 21.3. There is a malignant left external iliac node measuring 1.4 cm in short axis with maximum SUV  14.1. 2. Accentuated activity in the cecum and ascending colon is likely physiologic given that it has no CT correlate. Correlation with the patient's colon cancer screening history is recommended. If screening is not up-to-date, appropriate screening should be considered. 3.  Aortic Atherosclerosis (ICD10-I70.0).     04/28/2018 Surgery   Pre-operative Diagnosis:  At least Stage 2B SCCa Cervical Cancer   Post-operative Diagnosis: At least clinical Stage 3A SCCa Cervical Cancer   Operation:  Exam under anesthesia due to intolerance for vaginal exam in office Cystoscopy due to concern for extension to posterior bladder (from anterior vaginal wall lesion)   Operative Findings:   Parametrial involvement on right  Right sided subvaginal tumor burden ~3cm. This approximates the right ureteral insertion into the bladder based on my exam during cystoscopy. Extension of disease down upper half of vagina on lateral and posterior walls. Extension of disease down to lower third of anterior vagina (versus skip lesion) thus making her Stage 3A Cystoscopy revealed patent bilateral ureteral orifices and no bladder mucosa invasion or areas of concern. Rectal exam grossly no disease.     05/04/2018 Cancer Staging   Staging form: Cervix Uteri, AJCC 8th Edition - Clinical: Stage IIIB (cT3b, cN1, cM0) - Signed by [name], MD on 05/04/2018   05/12/2018 Procedure   Successful 8 French right internal jugular vein power port placement with its tip at the SVC/RA junction   05/13/2018 - 06/24/2018 Chemotherapy   The patient had weekly cisplatin given with concurrent radiation  05/17/2018 - 07/27/2018 Radiation Therapy   Radiation treatment dates:   05/17/2018-07/27/2018   Site/dose: 1. Cervix, 1.8 Gy in 25 fractions for a total dose of 45 Gy                    2. Pelvis  Boost, 1.8 Gy in 5 fractions for a total dose of 9 Gy                    3. Cervix, 5.5 Gy in 5 fractions for a total dose of 27.5 Gy    06/03/2018 Adverse Reaction   She missed her chemo due to severe depression and was sent to the ER for suicidal ideation.  She was seen by psychiatrist.   10/27/2018 PET scan   IMPRESSION: 1. Complete metabolic response to therapy, without residual or recurrent hypermetabolic disease. 2.  Aortic Atherosclerosis (ICD10-I70.0).     11/17/2018 Imaging   Successful right IJ vein Port-A-Cath explant.   03/29/2020 PET scan   1. Hypermetabolic lesion along the right vaginal cuff is highly worrisome for recurrent cervical cancer. No evidence of distant metastatic disease. 2.  Aortic atherosclerosis (ICD10-I70.0).   04/26/2020 - 12/10/2020 Chemotherapy   The patient had CARBOplatin  for chemotherapy treatment.      Genetic Testing   Patient has genetic testing done for PD-L1. Results revealed patient has the following:  PD-L1 combined positive score (CPS): 1%   07/08/2020 Imaging   1. Unchanged post treatment appearance of the pelvis, including a soft tissue nodule in the low right hemipelvis adjacent to the vagina measuring 2.8 x 1.7 cm, previously FDG avid and consistent with malignancy. 2. No evidence of nodal or distant metastatic disease in the abdomen or pelvis. 3. Aortic Atherosclerosis (ICD10-I70.0).   10/14/2020 Imaging   1. Unchanged right eccentric soft tissue mass in the low right hemipelvis centered on the cervix and vagina, measuring 2.9 x 1.9 cm. 2. New small volume fluid within the endometrial cavity, abnormal in the postmenopausal setting and suggesting obstruction of the cervical os by soft tissue mass. 3. No evidence of lymphadenopathy or distant metastatic disease in the abdomen or pelvis.   Aortic Atherosclerosis (ICD10-I70.0).   12/24/2020 Imaging   1. Abnormal hypodense masslike appearance continuous with hypodense masslike lesion probably centrally within a right eccentric cervix (versus right paracervical mass). The endometrial portion has enlarged compared to  10/14/2020 and active malignancy is suspected. 2. No adenopathy or findings of distant spread. 3. Other imaging findings of potential clinical significance: Aortic Atherosclerosis (ICD10-I70.0). Mild cardiomegaly. Stable scarring in the left lower lobe. Sigmoid colon scattered diverticula. Tarlov cysts at the S2 level. Vertical sclerosis in the sacral ala possibly due to stress fractures or prior radiation therapy.     01/06/2021 - 06/19/2021 Chemotherapy   Patient is on Treatment Plan : UTERINE Pembrolizumab q21d      04/07/2021 Imaging   1. The previously hypermetabolic right vaginal mass currently measures 12 cubic cm in volume, previously 11 cubic cm on 12/23/2020. 2. No new adenopathy or new findings of distant metastatic spread. 3. New wall thickening in several loops of distal ileum suspicious for inflammation/enteritis. 4. Other imaging findings of potential clinical significance: Mild cardiomegaly. Mild sigmoid colon diverticulosis. Stable bony demineralization with vertical sclerosis in the sacral ala likely from old insufficiency fracture.   07/10/2021 Imaging   1. New moderate right-sided hydroureteronephrosis with decreased perfusion to the right kidney compatible with obstructive uropathy.  2. Interval progression of the  heterogeneous low-density uterine lesion, with progression of enhancing soft tissue extending inferiorly to the right, compatible with previously described disease in the right vaginal wall. Represents the site of right ureteral obstruction. 3. No evidence for new metastatic disease in the abdomen or pelvis. 4. Bilateral sacral insufficiency fractures. 5. Trace free fluid in the pelvis. 6. Aortic Atherosclerosis (ICD10-I70.0).     07/22/2021 Procedure   Successful placement of a right internal jugular approach power injectable Port-A-Cath.     07/24/2021 - 10/02/2021 Chemotherapy   Patient is on Treatment Plan : cervical cancer Cisplatin q7d       10/16/2021  Imaging   1. Mild interval increase in size of heterogeneous tumor arising from within the uterus with local tumor extension into the right pelvic sidewall. 2. Persistent right-sided obstructive uropathy with severe right hydronephrosis and progressive right renal cortical volume loss. Right hydroureter extends into the pelvis where it is obstructed by the uterine/cervical mass. 3. Bilateral sacral insufficiency fractures. 4. Aortic Atherosclerosis (ICD10-I70.0).   11/06/2021 - 05/15/2022 Chemotherapy   Patient is on Treatment Plan : Cervical Paclitaxel q7d     02/10/2022 Imaging   1. Slight interval decrease in size of a heterogeneous mass within the right pelvis, involving the adjacent right aspect of the uterus and closely abutting the posterior urinary bladder,, consistent with treatment response. Fat stranding and presacral soft tissue thickening in the adjacent pelvis, unchanged. 2. Unchanged severe right hydronephrosis and hydroureter, the distal right ureter obstructed by mass in the pelvis, again with slight progression of right renal cortical atrophy. 3. Unchanged, mildly sclerotic nondisplaced bilateral sacral insufficiency fractures.   Aortic Atherosclerosis (ICD10-I70.0).   05/28/2022 Imaging   1. Slight interval increase in size of the lower uterine/cervical mass. 2. Persistent severe right-sided hydroureteronephrosis down to the level of the uterine/cervical mass. 3. Stable right-sided posterior bladder wall thickening. 4. No findings for metastatic disease involving the abdomen/pelvis.   Aortic Atherosclerosis (ICD10-I70.0).   05/29/2022 - 05/29/2022 Chemotherapy   Patient is on Treatment Plan : OVARIAN Paclitaxel (80) E3,1,54,00 q28d     06/12/2022 - 08/14/2022 Chemotherapy   Patient is on Treatment Plan : Cervix Gemcitabine D1,8 (1000) q21d     08/27/2022 Imaging   1. Progressive enlargement of ill-defined mass involving the lower uterus and cervix with probable progressive  parametrial extension on the right causing chronic right ureteral obstruction. Adjacent posterior bladder wall thickening as before, potentially due to direct extension. 2. No evidence of distant metastatic disease or adenopathy. 3. Stable chronic right-sided hydronephrosis with marked right renal cortical thinning. 4. Probable chronic sacral insufficiency fractures and/or sequela of prior radiation therapy. 5.  Aortic Atherosclerosis (ICD10-I70.0).   09/11/2022 -  Chemotherapy   Patient is on Treatment Plan : cervical cancer Cemiplimab q21d       PHYSICAL EXAMINATION: ECOG PERFORMANCE STATUS: 0 - Asymptomatic  Vitals:   10/02/22 0853  BP: 130/79  Pulse: 93  Resp: 18  Temp: 97.7 F (36.5 C)  SpO2: 100%   Filed Weights   10/02/22 0853  Weight: 86 lb 3.2 oz (39.1 kg)    GENERAL:alert, no distress and comfortable NEURO: alert & oriented x 3 with fluent speech, no focal motor/sensory deficits  LABORATORY DATA:  I have reviewed the data as listed    Component Value Date/Time   NA 135 09/11/2022 1344   K 4.1 09/11/2022 1344   CL 99 09/11/2022 1344   CO2 26 09/11/2022 1344   GLUCOSE 108 (H) 09/11/2022 1344   BUN  27 (H) 09/11/2022 1344   CREATININE 1.22 (H) 09/11/2022 1344   CALCIUM 9.0 09/11/2022 1344   PROT 7.3 09/11/2022 1344   ALBUMIN 4.0 09/11/2022 1344   AST 22 09/11/2022 1344   ALT 10 09/11/2022 1344   ALKPHOS 44 09/11/2022 1344   BILITOT 0.3 09/11/2022 1344   GFRNONAA 48 (L) 09/11/2022 1344   GFRAA >60 06/14/2020 0844   GFRAA >60 03/11/2020 0951    No results found for: "SPEP", "UPEP"  Lab Results  Component Value Date   WBC 6.2 10/02/2022   NEUTROABS 4.6 10/02/2022   HGB 10.4 (L) 10/02/2022   HCT 31.6 (L) 10/02/2022   MCV 92.4 10/02/2022   PLT 228 10/02/2022      Chemistry      Component Value Date/Time   NA 135 09/11/2022 1344   K 4.1 09/11/2022 1344   CL 99 09/11/2022 1344   CO2 26 09/11/2022 1344   BUN 27 (H) 09/11/2022 1344   CREATININE  1.22 (H) 09/11/2022 1344      Component Value Date/Time   CALCIUM 9.0 09/11/2022 1344   ALKPHOS 44 09/11/2022 1344   AST 22 09/11/2022 1344   ALT 10 09/11/2022 1344   BILITOT 0.3 09/11/2022 1344

## 2022-10-02 NOTE — Assessment & Plan Note (Signed)
She has compromised renal function due to hydronephrosis but overall asymptomatic I do not recommend stent placement long-term 

## 2022-10-02 NOTE — Assessment & Plan Note (Signed)
She has intermittent abnormal TSH I will adjust the dose of her Synthroid accordingly The dose of her Synthroid currently is at 75 mcg She has some 50 mcg at home We will call her with test results today

## 2022-10-02 NOTE — Assessment & Plan Note (Signed)
This is likely due to recent treatment. The patient denies recent history of bleeding such as epistaxis, hematuria or hematochezia. She is asymptomatic from the anemia. I will observe for now.   

## 2022-10-02 NOTE — Patient Instructions (Signed)
[facility]  Discharge Instructions: Thank you for choosing [facility] to provide your oncology and hematology care.   If you have a lab appointment with the Cancer Center, please go directly to the Cancer Center and check in at the registration area.   Wear comfortable clothing and clothing appropriate for easy access to any Portacath or PICC line.   We strive to give you quality time with your provider. You may need to reschedule your appointment if you arrive late (15 or more minutes).  Arriving late affects you and other patients whose appointments are after yours.  Also, if you miss three or more appointments without notifying the office, you may be dismissed from the clinic at the provider's discretion.      For prescription refill requests, have your pharmacy contact our office and allow 72 hours for refills to be completed.    Today you received the following chemotherapy and/or immunotherapy agents: Libtayo.       To help prevent nausea and vomiting after your treatment, we encourage you to take your nausea medication as directed.  BELOW ARE SYMPTOMS THAT SHOULD BE REPORTED IMMEDIATELY: *FEVER GREATER THAN 100.4 F (38 C) OR HIGHER *CHILLS OR SWEATING *NAUSEA AND VOMITING THAT IS NOT CONTROLLED WITH YOUR NAUSEA MEDICATION *UNUSUAL SHORTNESS OF BREATH *UNUSUAL BRUISING OR BLEEDING *URINARY PROBLEMS (pain or burning when urinating, or frequent urination) *BOWEL PROBLEMS (unusual diarrhea, constipation, pain near the anus) TENDERNESS IN MOUTH AND THROAT WITH OR WITHOUT PRESENCE OF ULCERS (sore throat, sores in mouth, or a toothache) UNUSUAL RASH, SWELLING OR PAIN  UNUSUAL VAGINAL DISCHARGE OR ITCHING   Items with * indicate a potential emergency and should be followed up as soon as possible or go to the Emergency Department if any problems should occur.  Please show the CHEMOTHERAPY ALERT CARD or IMMUNOTHERAPY ALERT CARD at  check-in to the Emergency Department and triage nurse.  Should you have questions after your visit or need to cancel or reschedule your appointment, please contact [facility]  Dept: [phone]  and follow the prompts.  Office hours are 8:00 a.m. to 4:30 p.m. Monday - Friday. Please note that voicemails left after 4:00 p.m. may not be returned until the following business day.  We are closed weekends and major holidays. You have access to a nurse at all times for urgent questions. Please call the main number to the clinic Dept: [phone] and follow the prompts.   For any non-urgent questions, you may also contact your provider using MyChart. We now offer e-Visits for anyone 18 and older to request care online for non-urgent symptoms. For details visit [email or web address].   Also download the MyChart app! Go to the app store, search "MyChart", open the app, select [facility], and log in with your MyChart username and password.   

## 2022-10-02 NOTE — Assessment & Plan Note (Signed)
So far, she tolerated treatment well without side effects The only abnormalities noted on her recent blood work is abnormal thyroid function, persistent anemia and elevated creatinine which are stable Will proceed with treatment without delay Plan to repeat imaging study after cycle 4 therapy

## 2022-10-04 ENCOUNTER — Other Ambulatory Visit: Payer: Self-pay

## 2022-10-16 ENCOUNTER — Other Ambulatory Visit: Payer: Self-pay

## 2022-10-23 ENCOUNTER — Other Ambulatory Visit: Payer: Self-pay

## 2022-10-23 ENCOUNTER — Inpatient Hospital Stay (HOSPITAL_BASED_OUTPATIENT_CLINIC_OR_DEPARTMENT_OTHER): Payer: Medicare Other | Admitting: Hematology and Oncology

## 2022-10-23 ENCOUNTER — Telehealth: Payer: Self-pay

## 2022-10-23 ENCOUNTER — Inpatient Hospital Stay: Payer: Medicare Other

## 2022-10-23 ENCOUNTER — Encounter: Payer: Self-pay | Admitting: Hematology and Oncology

## 2022-10-23 ENCOUNTER — Inpatient Hospital Stay: Payer: Medicare Other | Attending: Hematology and Oncology

## 2022-10-23 VITALS — BP 132/73 | HR 89 | Temp 97.8°F | Resp 18 | Ht 62.0 in | Wt 89.0 lb

## 2022-10-23 DIAGNOSIS — E538 Deficiency of other specified B group vitamins: Secondary | ICD-10-CM | POA: Insufficient documentation

## 2022-10-23 DIAGNOSIS — N1330 Unspecified hydronephrosis: Secondary | ICD-10-CM | POA: Insufficient documentation

## 2022-10-23 DIAGNOSIS — Z79890 Hormone replacement therapy (postmenopausal): Secondary | ICD-10-CM | POA: Diagnosis not present

## 2022-10-23 DIAGNOSIS — Z5112 Encounter for antineoplastic immunotherapy: Secondary | ICD-10-CM | POA: Diagnosis not present

## 2022-10-23 DIAGNOSIS — Z923 Personal history of irradiation: Secondary | ICD-10-CM | POA: Diagnosis not present

## 2022-10-23 DIAGNOSIS — D61818 Other pancytopenia: Secondary | ICD-10-CM

## 2022-10-23 DIAGNOSIS — Z79620 Long term (current) use of immunosuppressive biologic: Secondary | ICD-10-CM | POA: Diagnosis not present

## 2022-10-23 DIAGNOSIS — C539 Malignant neoplasm of cervix uteri, unspecified: Secondary | ICD-10-CM | POA: Diagnosis not present

## 2022-10-23 DIAGNOSIS — I700 Atherosclerosis of aorta: Secondary | ICD-10-CM | POA: Insufficient documentation

## 2022-10-23 DIAGNOSIS — Z9221 Personal history of antineoplastic chemotherapy: Secondary | ICD-10-CM | POA: Insufficient documentation

## 2022-10-23 DIAGNOSIS — Z7189 Other specified counseling: Secondary | ICD-10-CM

## 2022-10-23 DIAGNOSIS — M8008XA Age-related osteoporosis with current pathological fracture, vertebra(e), initial encounter for fracture: Secondary | ICD-10-CM | POA: Diagnosis not present

## 2022-10-23 LAB — CBC WITH DIFFERENTIAL (CANCER CENTER ONLY)
Abs Immature Granulocytes: 0.06 10*3/uL (ref 0.00–0.07)
Basophils Absolute: 0.1 10*3/uL (ref 0.0–0.1)
Basophils Relative: 1 %
Eosinophils Absolute: 0.2 10*3/uL (ref 0.0–0.5)
Eosinophils Relative: 2 %
HCT: 32.3 % — ABNORMAL LOW (ref 36.0–46.0)
Hemoglobin: 10.3 g/dL — ABNORMAL LOW (ref 12.0–15.0)
Immature Granulocytes: 1 %
Lymphocytes Relative: 10 %
Lymphs Abs: 0.7 10*3/uL (ref 0.7–4.0)
MCH: 29.7 pg (ref 26.0–34.0)
MCHC: 31.9 g/dL (ref 30.0–36.0)
MCV: 93.1 fL (ref 80.0–100.0)
Monocytes Absolute: 0.6 10*3/uL (ref 0.1–1.0)
Monocytes Relative: 9 %
Neutro Abs: 5.3 10*3/uL (ref 1.7–7.7)
Neutrophils Relative %: 77 %
Platelet Count: 268 10*3/uL (ref 150–400)
RBC: 3.47 MIL/uL — ABNORMAL LOW (ref 3.87–5.11)
RDW: 15.5 % (ref 11.5–15.5)
WBC Count: 6.8 10*3/uL (ref 4.0–10.5)
nRBC: 0 % (ref 0.0–0.2)

## 2022-10-23 LAB — CMP (CANCER CENTER ONLY)
ALT: 6 U/L (ref 0–44)
AST: 15 U/L (ref 15–41)
Albumin: 3.9 g/dL (ref 3.5–5.0)
Alkaline Phosphatase: 45 U/L (ref 38–126)
Anion gap: 8 (ref 5–15)
BUN: 17 mg/dL (ref 8–23)
CO2: 24 mmol/L (ref 22–32)
Calcium: 9.1 mg/dL (ref 8.9–10.3)
Chloride: 108 mmol/L (ref 98–111)
Creatinine: 1.05 mg/dL — ABNORMAL HIGH (ref 0.44–1.00)
GFR, Estimated: 57 mL/min — ABNORMAL LOW (ref >60)
Glucose, Bld: 81 mg/dL (ref 70–99)
Potassium: 3.8 mmol/L (ref 3.5–5.1)
Sodium: 140 mmol/L (ref 135–145)
Total Bilirubin: 0.4 mg/dL (ref 0.3–1.2)
Total Protein: 7.4 g/dL (ref 6.5–8.1)

## 2022-10-23 LAB — VITAMIN B12: Vitamin B-12: 195 pg/mL (ref 180–914)

## 2022-10-23 LAB — TSH: TSH: 8.921 u[IU]/mL — ABNORMAL HIGH (ref 0.350–4.500)

## 2022-10-23 MED ORDER — SODIUM CHLORIDE 0.9 % IV SOLN: Freq: Once | INTRAVENOUS | Status: AC

## 2022-10-23 MED ORDER — CYANOCOBALAMIN 1000 MCG/ML IJ SOLN
1000.0000 ug | Freq: Once | INTRAMUSCULAR | Status: AC
  Administered 2022-10-23: 1000 ug via INTRAMUSCULAR
  Filled 2022-10-23: qty 1

## 2022-10-23 MED ORDER — LEVOTHYROXINE SODIUM 100 MCG PO TABS: 100.0000 ug | ORAL_TABLET | Freq: Every day | ORAL | 0 refills | Status: AC

## 2022-10-23 MED ORDER — SODIUM CHLORIDE 0.9% FLUSH
10.0000 mL | Freq: Once | INTRAVENOUS | Status: AC
  Administered 2022-10-23: 10 mL

## 2022-10-23 MED ORDER — SODIUM CHLORIDE 0.9 % IV SOLN
350.0000 mg | Freq: Once | INTRAVENOUS | Status: AC
  Administered 2022-10-23: 350 mg via INTRAVENOUS
  Filled 2022-10-23: qty 7

## 2022-10-23 MED ORDER — HEPARIN SOD (PORK) LOCK FLUSH 100 UNIT/ML IV SOLN: 250.0000 [IU] | Freq: Once | INTRAVENOUS | Status: DC

## 2022-10-23 NOTE — Assessment & Plan Note (Signed)
She has compromised renal function due to hydronephrosis but overall asymptomatic I do not recommend stent placement long-term

## 2022-10-23 NOTE — Assessment & Plan Note (Signed)
So far, she tolerated treatment well without side effects She has persistent anemia and elevated creatinine which are stable Will proceed with treatment without delay Plan to repeat imaging study after cycle 4 therapy

## 2022-10-23 NOTE — Telephone Encounter (Signed)
Called and given below message. She verbalized understanding. Rx sent to preferred pharmacy.

## 2022-10-23 NOTE — Patient Instructions (Signed)
[facility]  Discharge Instructions: Thank you for choosing [facility] to provide your oncology and hematology care.   If you have a lab appointment with the Cancer Center, please go directly to the Cancer Center and check in at the registration area.   Wear comfortable clothing and clothing appropriate for easy access to any Portacath or PICC line.   We strive to give you quality time with your provider. You may need to reschedule your appointment if you arrive late (15 or more minutes).  Arriving late affects you and other patients whose appointments are after yours.  Also, if you miss three or more appointments without notifying the office, you may be dismissed from the clinic at the provider's discretion.      For prescription refill requests, have your pharmacy contact our office and allow 72 hours for refills to be completed.    Today you received the following chemotherapy and/or immunotherapy agents: Libtayo.       To help prevent nausea and vomiting after your treatment, we encourage you to take your nausea medication as directed.  BELOW ARE SYMPTOMS THAT SHOULD BE REPORTED IMMEDIATELY: *FEVER GREATER THAN 100.4 F (38 C) OR HIGHER *CHILLS OR SWEATING *NAUSEA AND VOMITING THAT IS NOT CONTROLLED WITH YOUR NAUSEA MEDICATION *UNUSUAL SHORTNESS OF BREATH *UNUSUAL BRUISING OR BLEEDING *URINARY PROBLEMS (pain or burning when urinating, or frequent urination) *BOWEL PROBLEMS (unusual diarrhea, constipation, pain near the anus) TENDERNESS IN MOUTH AND THROAT WITH OR WITHOUT PRESENCE OF ULCERS (sore throat, sores in mouth, or a toothache) UNUSUAL RASH, SWELLING OR PAIN  UNUSUAL VAGINAL DISCHARGE OR ITCHING   Items with * indicate a potential emergency and should be followed up as soon as possible or go to the Emergency Department if any problems should occur.  Please show the CHEMOTHERAPY ALERT CARD or IMMUNOTHERAPY ALERT CARD at  check-in to the Emergency Department and triage nurse.  Should you have questions after your visit or need to cancel or reschedule your appointment, please contact [facility]  Dept: [phone]  and follow the prompts.  Office hours are 8:00 a.m. to 4:30 p.m. Monday - Friday. Please note that voicemails left after 4:00 p.m. may not be returned until the following business day.  We are closed weekends and major holidays. You have access to a nurse at all times for urgent questions. Please call the main number to the clinic Dept: [phone] and follow the prompts.   For any non-urgent questions, you may also contact your provider using MyChart. We now offer e-Visits for anyone 18 and older to request care online for non-urgent symptoms. For details visit [email or web address].   Also download the MyChart app! Go to the app store, search "MyChart", open the app, select [facility], and log in with your MyChart username and password.   

## 2022-10-23 NOTE — Progress Notes (Signed)
[facility] OFFICE PROGRESS NOTE  Patient Care Team: Default, Provider, MD as PCP - General  ASSESSMENT & PLAN:  Malignant neoplasm of cervix ([place]) So far, she tolerated treatment well without side effects She has persistent anemia and elevated creatinine which are stable Will proceed with treatment without delay Plan to repeat imaging study after cycle 4 therapy  Vitamin B12 deficiency We will continue B12 injections monthly I plan to monitor her B12   Hydronephrosis of right kidney She has compromised renal function due to hydronephrosis but overall asymptomatic I do not recommend stent placement long-term  Orders Placed This Encounter  Procedures   Vitamin B12    Standing Status:   Future    Number of Occurrences:   1    Standing Expiration Date:   10/24/2023    All questions were answered. The patient knows to call the clinic with any problems, questions or concerns. The total time spent in the appointment was 20 minutes encounter with patients including review of chart and various tests results, discussions about plan of care and coordination of care plan   [name], MD 10/23/2022 10:30 AM  INTERVAL HISTORY: Please see below for problem oriented charting. she returns for treatment follow-up She tolerated treatment well Denies side effects Denies pelvic pain or abnormal vaginal discharge  REVIEW OF SYSTEMS:   Constitutional: Denies fevers, chills or abnormal weight loss Eyes: Denies blurriness of vision Ears, nose, mouth, throat, and face: Denies mucositis or sore throat Respiratory: Denies cough, dyspnea or wheezes Cardiovascular: Denies palpitation, chest discomfort or lower extremity swelling Gastrointestinal:  Denies nausea, heartburn or change in bowel habits Skin: Denies abnormal skin rashes Lymphatics: Denies new lymphadenopathy or easy bruising Neurological:Denies numbness, tingling or new weaknesses Behavioral/Psych: Mood is stable, no new  changes  All other systems were reviewed with the patient and are negative.  I have reviewed the past medical history, past surgical history, social history and family history with the patient and they are unchanged from previous note.  ALLERGIES:  has No Known Allergies.  MEDICATIONS:  Current Outpatient Medications  Medication Sig Dispense Refill   levothyroxine (SYNTHROID) 75 MCG tablet Take 1 tablet (75 mcg total) by mouth daily before breakfast. 30 tablet 1   lidocaine-prilocaine (EMLA) cream Apply to affected area once 30 g 3   Multiple Vitamin (MULTIVITAMIN) tablet Take 1 tablet by mouth daily.     ondansetron (ZOFRAN) 8 MG tablet Take 1 tablet (8 mg total) by mouth 2 (two) times daily as needed (Nausea or vomiting). 30 tablet 1   prochlorperazine (COMPAZINE) 10 MG tablet Take 1 tablet (10 mg total) by mouth every 6 (six) hours as needed (Nausea or vomiting). 30 tablet 1   No current facility-administered medications for this visit.   Facility-Administered Medications Ordered in Other Visits  Medication Dose Route Frequency Provider Last Rate Last Admin   diphenhydrAMINE (BENADRYL) 50 MG/ML injection            famotidine (PEPCID) 20-0.9 MG/50ML-% IVPB             SUMMARY OF ONCOLOGIC HISTORY: Oncology History Overview Note  Cancer Staging Malignant neoplasm of cervix ([place]) Staging form: Cervix Uteri, AJCC 8th Edition - Clinical: Stage IIIB (cT3b, cN1, cM0) - Signed by [name], MD on 05/04/2018  PD-L1 1% Progressed on carboplatin, pembrolizumab, cisplatin and taxol, gemzar   Malignant neoplasm of cervix ([place])  03/13/2018 Imaging   US pelvis There are mobile echogenic foci within the endometrial cavity  suggestive of gas. This is nonspecific in etiology however may be secondary to endometritis. Correlate for history of recent instrumentation. This obscures visualization of the endometrial tissue and therefore a follow-up pelvic ultrasound in 2-4 weeks is recommended after  resolution of the acute symptomatology if symptoms persist to further evaluate the endometrium.     03/13/2018 Initial Diagnosis   She initially presented with PMB   04/12/2018 Pathology Results   Cervix, biopsy, mass - INVASIVE SQUAMOUS CELL CARCINOMA - SEE COMMENT   04/12/2018 Surgery   PREOPERATIVE DIAGNOSIS:  Postmenopausal vaginal bleeding. POSTOPERATIVE DIAGNOSIS: The same PROCEDURE: Exam under anesthesia, pap smear, cervical mass biopsy SURGEON:  Dr. [name]   INDICATIONS: 72 y.o. yo [id number] with PMB here for exam under anesthesia.Risks of surgery were discussed with the patient including but not limited to: bleeding which may require transfusion; infection which may require antibiotics; injury to uterus or surrounding organs; need for additional procedures including laparotomy or laparoscopy; and other postoperative/anesthesia complications. Written informed consent was obtained.     FINDINGS:  An 8-week size midline uterus.  No adnexal mass palpable on exam. Normal cervix not visualized. Friable mass seen in involving the vagina at the level of the cervix obliterating the anterior and posterior fornix.   ANESTHESIA:   General INTRAVENOUS FLUIDS:  300 ml of LR ESTIMATED BLOOD LOSS: 20 ml. SPECIMENS: pap smear, cervical mass biopsy COMPLICATIONS:  None immediate.     04/25/2018 PET scan   1. Large cervical mass may invade into the myometrium in down into the vagina, maximum SUV 21.3. There is a malignant left external iliac node measuring 1.4 cm in short axis with maximum SUV 14.1. 2. Accentuated activity in the cecum and ascending colon is likely physiologic given that it has no CT correlate. Correlation with the patient's colon cancer screening history is recommended. If screening is not up-to-date, appropriate screening should be considered. 3.  Aortic Atherosclerosis (ICD10-I70.0).     04/28/2018 Surgery   Pre-operative Diagnosis:  At least Stage 2B SCCa Cervical  Cancer   Post-operative Diagnosis: At least clinical Stage 3A SCCa Cervical Cancer   Operation:  Exam under anesthesia due to intolerance for vaginal exam in office Cystoscopy due to concern for extension to posterior bladder (from anterior vaginal wall lesion)   Operative Findings:   Parametrial involvement on right  Right sided subvaginal tumor burden ~3cm. This approximates the right ureteral insertion into the bladder based on my exam during cystoscopy. Extension of disease down upper half of vagina on lateral and posterior walls. Extension of disease down to lower third of anterior vagina (versus skip lesion) thus making her Stage 3A Cystoscopy revealed patent bilateral ureteral orifices and no bladder mucosa invasion or areas of concern. Rectal exam grossly no disease.     05/04/2018 Cancer Staging   Staging form: Cervix Uteri, AJCC 8th Edition - Clinical: Stage IIIB (cT3b, cN1, cM0) - Signed by [name], MD on 05/04/2018   05/12/2018 Procedure   Successful 8 French right internal jugular vein power port placement with its tip at the SVC/RA junction   05/13/2018 - 06/24/2018 Chemotherapy   The patient had weekly cisplatin given with concurrent radiation    05/17/2018 - 07/27/2018 Radiation Therapy   Radiation treatment dates:   05/17/2018-07/27/2018   Site/dose: 1. Cervix, 1.8 Gy in 25 fractions for a total dose of 45 Gy                    2. Pelvis  Boost, 1.8 Gy in 5 fractions for a total dose of 9 Gy                    3. Cervix, 5.5 Gy in 5 fractions for a total dose of 27.5 Gy   06/03/2018 Adverse Reaction   She missed her chemo due to severe depression and was sent to the ER for suicidal ideation.  She was seen by psychiatrist.   10/27/2018 PET scan   IMPRESSION: 1. Complete metabolic response to therapy, without residual or recurrent hypermetabolic disease. 2.  Aortic Atherosclerosis (ICD10-I70.0).     11/17/2018 Imaging   Successful right IJ vein Port-A-Cath  explant.   03/29/2020 PET scan   1. Hypermetabolic lesion along the right vaginal cuff is highly worrisome for recurrent cervical cancer. No evidence of distant metastatic disease. 2.  Aortic atherosclerosis (ICD10-I70.0).   04/26/2020 - 12/10/2020 Chemotherapy   The patient had CARBOplatin  for chemotherapy treatment.      Genetic Testing   Patient has genetic testing done for PD-L1. Results revealed patient has the following:  PD-L1 combined positive score (CPS): 1%   07/08/2020 Imaging   1. Unchanged post treatment appearance of the pelvis, including a soft tissue nodule in the low right hemipelvis adjacent to the vagina measuring 2.8 x 1.7 cm, previously FDG avid and consistent with malignancy. 2. No evidence of nodal or distant metastatic disease in the abdomen or pelvis. 3. Aortic Atherosclerosis (ICD10-I70.0).   10/14/2020 Imaging   1. Unchanged right eccentric soft tissue mass in the low right hemipelvis centered on the cervix and vagina, measuring 2.9 x 1.9 cm. 2. New small volume fluid within the endometrial cavity, abnormal in the postmenopausal setting and suggesting obstruction of the cervical os by soft tissue mass. 3. No evidence of lymphadenopathy or distant metastatic disease in the abdomen or pelvis.   Aortic Atherosclerosis (ICD10-I70.0).   12/24/2020 Imaging   1. Abnormal hypodense masslike appearance continuous with hypodense masslike lesion probably centrally within a right eccentric cervix (versus right paracervical mass). The endometrial portion has enlarged compared to 10/14/2020 and active malignancy is suspected. 2. No adenopathy or findings of distant spread. 3. Other imaging findings of potential clinical significance: Aortic Atherosclerosis (ICD10-I70.0). Mild cardiomegaly. Stable scarring in the left lower lobe. Sigmoid colon scattered diverticula. Tarlov cysts at the S2 level. Vertical sclerosis in the sacral ala possibly due to stress fractures or prior  radiation therapy.     01/06/2021 - 06/19/2021 Chemotherapy   Patient is on Treatment Plan : UTERINE Pembrolizumab q21d      04/07/2021 Imaging   1. The previously hypermetabolic right vaginal mass currently measures 12 cubic cm in volume, previously 11 cubic cm on 12/23/2020. 2. No new adenopathy or new findings of distant metastatic spread. 3. New wall thickening in several loops of distal ileum suspicious for inflammation/enteritis. 4. Other imaging findings of potential clinical significance: Mild cardiomegaly. Mild sigmoid colon diverticulosis. Stable bony demineralization with vertical sclerosis in the sacral ala likely from old insufficiency fracture.   07/10/2021 Imaging   1. New moderate right-sided hydroureteronephrosis with decreased perfusion to the right kidney compatible with obstructive uropathy.  2. Interval progression of the heterogeneous low-density uterine lesion, with progression of enhancing soft tissue extending inferiorly to the right, compatible with previously described disease in the right vaginal wall. Represents the site of right ureteral obstruction. 3. No evidence for new metastatic disease in the abdomen or pelvis. 4. Bilateral sacral insufficiency fractures. 5. Trace free  fluid in the pelvis. 6. Aortic Atherosclerosis (ICD10-I70.0).     07/22/2021 Procedure   Successful placement of a right internal jugular approach power injectable Port-A-Cath.     07/24/2021 - 10/02/2021 Chemotherapy   Patient is on Treatment Plan : cervical cancer Cisplatin q7d       10/16/2021 Imaging   1. Mild interval increase in size of heterogeneous tumor arising from within the uterus with local tumor extension into the right pelvic sidewall. 2. Persistent right-sided obstructive uropathy with severe right hydronephrosis and progressive right renal cortical volume loss. Right hydroureter extends into the pelvis where it is obstructed by the uterine/cervical mass. 3. Bilateral sacral  insufficiency fractures. 4. Aortic Atherosclerosis (ICD10-I70.0).   11/06/2021 - 05/15/2022 Chemotherapy   Patient is on Treatment Plan : Cervical Paclitaxel q7d     02/10/2022 Imaging   1. Slight interval decrease in size of a heterogeneous mass within the right pelvis, involving the adjacent right aspect of the uterus and closely abutting the posterior urinary bladder,, consistent with treatment response. Fat stranding and presacral soft tissue thickening in the adjacent pelvis, unchanged. 2. Unchanged severe right hydronephrosis and hydroureter, the distal right ureter obstructed by mass in the pelvis, again with slight progression of right renal cortical atrophy. 3. Unchanged, mildly sclerotic nondisplaced bilateral sacral insufficiency fractures.   Aortic Atherosclerosis (ICD10-I70.0).   05/28/2022 Imaging   1. Slight interval increase in size of the lower uterine/cervical mass. 2. Persistent severe right-sided hydroureteronephrosis down to the level of the uterine/cervical mass. 3. Stable right-sided posterior bladder wall thickening. 4. No findings for metastatic disease involving the abdomen/pelvis.   Aortic Atherosclerosis (ICD10-I70.0).   05/29/2022 - 05/29/2022 Chemotherapy   Patient is on Treatment Plan : OVARIAN Paclitaxel (80) [id number] q28d     06/12/2022 - 08/14/2022 Chemotherapy   Patient is on Treatment Plan : Cervix Gemcitabine D1,8 (1000) q21d     08/27/2022 Imaging   1. Progressive enlargement of ill-defined mass involving the lower uterus and cervix with probable progressive parametrial extension on the right causing chronic right ureteral obstruction. Adjacent posterior bladder wall thickening as before, potentially due to direct extension. 2. No evidence of distant metastatic disease or adenopathy. 3. Stable chronic right-sided hydronephrosis with marked right renal cortical thinning. 4. Probable chronic sacral insufficiency fractures and/or sequela of prior radiation  therapy. 5.  Aortic Atherosclerosis (ICD10-I70.0).   09/11/2022 -  Chemotherapy   Patient is on Treatment Plan : cervical cancer Cemiplimab q21d       PHYSICAL EXAMINATION: ECOG PERFORMANCE STATUS: 0 - Asymptomatic  Vitals:   10/23/22 0827  BP: 132/73  Pulse: 89  Resp: 18  Temp: 97.8 F (36.6 C)  SpO2: 99%   Filed Weights   10/23/22 0827  Weight: 89 lb (40.4 kg)    GENERAL:alert, no distress and comfortable  NEURO: alert & oriented x 3 with fluent speech, no focal motor/sensory deficits  LABORATORY DATA:  I have reviewed the data as listed    Component Value Date/Time   NA 140 10/23/2022 0804   K 3.8 10/23/2022 0804   CL 108 10/23/2022 0804   CO2 24 10/23/2022 0804   GLUCOSE 81 10/23/2022 0804   BUN 17 10/23/2022 0804   CREATININE 1.05 (H) 10/23/2022 0804   CALCIUM 9.1 10/23/2022 0804   PROT 7.4 10/23/2022 0804   ALBUMIN 3.9 10/23/2022 0804   AST 15 10/23/2022 0804   ALT 6 10/23/2022 0804   ALKPHOS 45 10/23/2022 0804   BILITOT 0.4 10/23/2022 0804  GFRNONAA 57 (L) 10/23/2022 0804   GFRAA >60 06/14/2020 0844   GFRAA >60 03/11/2020 0951    No results found for: "SPEP", "UPEP"  Lab Results  Component Value Date   WBC 6.8 10/23/2022   NEUTROABS 5.3 10/23/2022   HGB 10.3 (L) 10/23/2022   HCT 32.3 (L) 10/23/2022   MCV 93.1 10/23/2022   PLT 268 10/23/2022      Chemistry      Component Value Date/Time   NA 140 10/23/2022 0804   K 3.8 10/23/2022 0804   CL 108 10/23/2022 0804   CO2 24 10/23/2022 0804   BUN 17 10/23/2022 0804   CREATININE 1.05 (H) 10/23/2022 0804      Component Value Date/Time   CALCIUM 9.1 10/23/2022 0804   ALKPHOS 45 10/23/2022 0804   AST 15 10/23/2022 0804   ALT 6 10/23/2022 0804   BILITOT 0.4 10/23/2022 0804

## 2022-10-23 NOTE — Telephone Encounter (Signed)
-----   Message from [name], MD sent at 10/23/2022  1:30 PM EST ----- We need to increase synthroid to 100 mcg Can you call and ask if she has the 100 mcg dose? If not call in 30 tabs, no refills to her pharmacy

## 2022-10-23 NOTE — Assessment & Plan Note (Signed)
We will continue B12 injections monthly I plan to monitor her B12

## 2022-10-25 LAB — T4: T4, Total: 11.5 ug/dL (ref 4.5–12.0)

## 2022-10-26 ENCOUNTER — Other Ambulatory Visit: Payer: Self-pay | Admitting: Hematology and Oncology

## 2022-11-08 NOTE — Progress Notes (Deleted)
Pharmacist Chemotherapy Monitoring - Initial Assessment    Anticipated start date: 11/13/22    The following has been reviewed per standard work regarding the patient's treatment regimen: The patient's diagnosis, treatment plan and drug doses, and organ/hematologic function Lab orders and baseline tests specific to treatment regimen  The treatment plan start date, drug sequencing, and pre-medications Prior authorization status  Patient's documented medication list, including drug-drug interaction screen and prescriptions for anti-emetics and supportive care specific to the treatment regimen The drug concentrations, fluid compatibility, administration routes, and timing of the medications to be used The patient's access for treatment and lifetime cumulative dose history, if applicable  The patient's medication allergies and previous infusion related reactions, if applicable   Changes made to treatment plan:  N/A  Follow up needed:  Pending authorization for treatment    Acquanetta Belling, RPH, BCPS, BCOP 11/08/2022  4:40 PM

## 2022-11-13 ENCOUNTER — Inpatient Hospital Stay (HOSPITAL_BASED_OUTPATIENT_CLINIC_OR_DEPARTMENT_OTHER): Payer: Medicare Other | Admitting: Hematology and Oncology

## 2022-11-13 ENCOUNTER — Inpatient Hospital Stay: Payer: Medicare Other

## 2022-11-13 ENCOUNTER — Other Ambulatory Visit: Payer: Self-pay

## 2022-11-13 ENCOUNTER — Encounter: Payer: Self-pay | Admitting: Hematology and Oncology

## 2022-11-13 ENCOUNTER — Inpatient Hospital Stay: Payer: Medicare Other | Attending: Hematology and Oncology

## 2022-11-13 VITALS — BP 137/86 | HR 86 | Temp 98.0°F | Resp 18 | Ht 62.0 in | Wt 90.2 lb

## 2022-11-13 VITALS — BP 134/76 | HR 77 | Resp 17

## 2022-11-13 DIAGNOSIS — N131 Hydronephrosis with ureteral stricture, not elsewhere classified: Secondary | ICD-10-CM | POA: Diagnosis not present

## 2022-11-13 DIAGNOSIS — D538 Other specified nutritional anemias: Secondary | ICD-10-CM | POA: Diagnosis not present

## 2022-11-13 DIAGNOSIS — R944 Abnormal results of kidney function studies: Secondary | ICD-10-CM | POA: Insufficient documentation

## 2022-11-13 DIAGNOSIS — Z7189 Other specified counseling: Secondary | ICD-10-CM

## 2022-11-13 DIAGNOSIS — I517 Cardiomegaly: Secondary | ICD-10-CM | POA: Diagnosis not present

## 2022-11-13 DIAGNOSIS — R1900 Intra-abdominal and pelvic swelling, mass and lump, unspecified site: Secondary | ICD-10-CM | POA: Diagnosis not present

## 2022-11-13 DIAGNOSIS — E538 Deficiency of other specified B group vitamins: Secondary | ICD-10-CM

## 2022-11-13 DIAGNOSIS — C539 Malignant neoplasm of cervix uteri, unspecified: Secondary | ICD-10-CM

## 2022-11-13 DIAGNOSIS — D649 Anemia, unspecified: Secondary | ICD-10-CM | POA: Insufficient documentation

## 2022-11-13 DIAGNOSIS — E039 Hypothyroidism, unspecified: Secondary | ICD-10-CM | POA: Diagnosis not present

## 2022-11-13 DIAGNOSIS — D61818 Other pancytopenia: Secondary | ICD-10-CM

## 2022-11-13 DIAGNOSIS — Z5111 Encounter for antineoplastic chemotherapy: Secondary | ICD-10-CM | POA: Diagnosis not present

## 2022-11-13 DIAGNOSIS — Z79899 Other long term (current) drug therapy: Secondary | ICD-10-CM | POA: Diagnosis not present

## 2022-11-13 DIAGNOSIS — I700 Atherosclerosis of aorta: Secondary | ICD-10-CM | POA: Insufficient documentation

## 2022-11-13 DIAGNOSIS — Z5112 Encounter for antineoplastic immunotherapy: Secondary | ICD-10-CM | POA: Diagnosis not present

## 2022-11-13 LAB — CBC WITH DIFFERENTIAL (CANCER CENTER ONLY)
Abs Immature Granulocytes: 0.04 10*3/uL (ref 0.00–0.07)
Basophils Absolute: 0.1 10*3/uL (ref 0.0–0.1)
Basophils Relative: 1 %
Eosinophils Absolute: 0.1 10*3/uL (ref 0.0–0.5)
Eosinophils Relative: 2 %
HCT: 31.6 % — ABNORMAL LOW (ref 36.0–46.0)
Hemoglobin: 10.1 g/dL — ABNORMAL LOW (ref 12.0–15.0)
Immature Granulocytes: 1 %
Lymphocytes Relative: 11 %
Lymphs Abs: 0.7 10*3/uL (ref 0.7–4.0)
MCH: 29 pg (ref 26.0–34.0)
MCHC: 32 g/dL (ref 30.0–36.0)
MCV: 90.8 fL (ref 80.0–100.0)
Monocytes Absolute: 0.8 10*3/uL (ref 0.1–1.0)
Monocytes Relative: 12 %
Neutro Abs: 5 10*3/uL (ref 1.7–7.7)
Neutrophils Relative %: 73 %
Platelet Count: 261 10*3/uL (ref 150–400)
RBC: 3.48 MIL/uL — ABNORMAL LOW (ref 3.87–5.11)
RDW: 14.5 % (ref 11.5–15.5)
WBC Count: 6.8 10*3/uL (ref 4.0–10.5)
nRBC: 0 % (ref 0.0–0.2)

## 2022-11-13 LAB — CMP (CANCER CENTER ONLY)
ALT: 8 U/L (ref 0–44)
AST: 16 U/L (ref 15–41)
Albumin: 3.4 g/dL — ABNORMAL LOW (ref 3.5–5.0)
Alkaline Phosphatase: 53 U/L (ref 38–126)
Anion gap: 8 (ref 5–15)
BUN: 28 mg/dL — ABNORMAL HIGH (ref 8–23)
CO2: 25 mmol/L (ref 22–32)
Calcium: 8.8 mg/dL — ABNORMAL LOW (ref 8.9–10.3)
Chloride: 105 mmol/L (ref 98–111)
Creatinine: 1.26 mg/dL — ABNORMAL HIGH (ref 0.44–1.00)
GFR, Estimated: 46 mL/min — ABNORMAL LOW (ref >60)
Glucose, Bld: 91 mg/dL (ref 70–99)
Potassium: 4.1 mmol/L (ref 3.5–5.1)
Sodium: 138 mmol/L (ref 135–145)
Total Bilirubin: 0.4 mg/dL (ref 0.3–1.2)
Total Protein: 7.5 g/dL (ref 6.5–8.1)

## 2022-11-13 LAB — TSH: TSH: 1.299 u[IU]/mL (ref 0.350–4.500)

## 2022-11-13 MED ORDER — HEPARIN SOD (PORK) LOCK FLUSH 100 UNIT/ML IV SOLN
500.0000 [IU] | Freq: Once | INTRAVENOUS | Status: AC | PRN
  Administered 2022-11-13: 500 [IU]

## 2022-11-13 MED ORDER — SODIUM CHLORIDE 0.9 % IV SOLN
350.0000 mg | Freq: Once | INTRAVENOUS | Status: AC
  Administered 2022-11-13: 350 mg via INTRAVENOUS
  Filled 2022-11-13: qty 7

## 2022-11-13 MED ORDER — SODIUM CHLORIDE 0.9% FLUSH
10.0000 mL | Freq: Once | INTRAVENOUS | Status: AC
  Administered 2022-11-13: 10 mL

## 2022-11-13 MED ORDER — SODIUM CHLORIDE 0.9% FLUSH
10.0000 mL | INTRAVENOUS | Status: DC | PRN
  Administered 2022-11-13: 10 mL

## 2022-11-13 MED ORDER — CYANOCOBALAMIN 1000 MCG/ML IJ SOLN
1000.0000 ug | Freq: Once | INTRAMUSCULAR | Status: AC
  Administered 2022-11-13: 1000 ug via INTRAMUSCULAR
  Filled 2022-11-13: qty 1

## 2022-11-13 MED ORDER — SODIUM CHLORIDE 0.9 % IV SOLN: Freq: Once | INTRAVENOUS | Status: AC

## 2022-11-13 NOTE — Assessment & Plan Note (Signed)
We will continue B12 injections with every visit

## 2022-11-13 NOTE — Patient Instructions (Signed)
[facility]  Discharge Instructions: Thank you for choosing [facility] to provide your oncology and hematology care.   If you have a lab appointment with the Cancer Center, please go directly to the Cancer Center and check in at the registration area.   Wear comfortable clothing and clothing appropriate for easy access to any Portacath or PICC line.   We strive to give you quality time with your provider. You may need to reschedule your appointment if you arrive late (15 or more minutes).  Arriving late affects you and other patients whose appointments are after yours.  Also, if you miss three or more appointments without notifying the office, you may be dismissed from the clinic at the provider's discretion.      For prescription refill requests, have your pharmacy contact our office and allow 72 hours for refills to be completed.    Today you received the following chemotherapy and/or immunotherapy agents: Libtayo.       To help prevent nausea and vomiting after your treatment, we encourage you to take your nausea medication as directed.  BELOW ARE SYMPTOMS THAT SHOULD BE REPORTED IMMEDIATELY: *FEVER GREATER THAN 100.4 F (38 C) OR HIGHER *CHILLS OR SWEATING *NAUSEA AND VOMITING THAT IS NOT CONTROLLED WITH YOUR NAUSEA MEDICATION *UNUSUAL SHORTNESS OF BREATH *UNUSUAL BRUISING OR BLEEDING *URINARY PROBLEMS (pain or burning when urinating, or frequent urination) *BOWEL PROBLEMS (unusual diarrhea, constipation, pain near the anus) TENDERNESS IN MOUTH AND THROAT WITH OR WITHOUT PRESENCE OF ULCERS (sore throat, sores in mouth, or a toothache) UNUSUAL RASH, SWELLING OR PAIN  UNUSUAL VAGINAL DISCHARGE OR ITCHING   Items with * indicate a potential emergency and should be followed up as soon as possible or go to the Emergency Department if any problems should occur.  Please show the CHEMOTHERAPY ALERT CARD or IMMUNOTHERAPY ALERT CARD at  check-in to the Emergency Department and triage nurse.  Should you have questions after your visit or need to cancel or reschedule your appointment, please contact [facility]  Dept: [phone]  and follow the prompts.  Office hours are 8:00 a.m. to 4:30 p.m. Monday - Friday. Please note that voicemails left after 4:00 p.m. may not be returned until the following business day.  We are closed weekends and major holidays. You have access to a nurse at all times for urgent questions. Please call the main number to the clinic Dept: [phone] and follow the prompts.   For any non-urgent questions, you may also contact your provider using MyChart. We now offer e-Visits for anyone 18 and older to request care online for non-urgent symptoms. For details visit [email or web address].   Also download the MyChart app! Go to the app store, search "MyChart", open the app, select [facility], and log in with your MyChart username and password.   

## 2022-11-13 NOTE — Progress Notes (Signed)
Ok to proceed with treatment without CMP results per Dr Alvy Bimler

## 2022-11-13 NOTE — Assessment & Plan Note (Signed)
So far, she tolerated treatment well without side effects She has persistent anemia and elevated creatinine which are stable Will proceed with treatment without delay Plan to repeat imaging study after cycle 4 therapy

## 2022-11-13 NOTE — Progress Notes (Signed)
[facility] OFFICE PROGRESS NOTE  Patient Care Team: Default, Provider, MD as PCP - General  ASSESSMENT & PLAN:  Malignant neoplasm of cervix ([place]) So far, she tolerated treatment well without side effects She has persistent anemia and elevated creatinine which are stable Will proceed with treatment without delay Plan to repeat imaging study after cycle 4 therapy  Vitamin B12 deficiency We will continue B12 injections with every visit  Acquired hypothyroidism 72} has intermittent abnormal TSH I will adjust the dose of her Synthroid accordingly We will call her with test results today  Orders Placed This Encounter  Procedures   CT ABDOMEN PELVIS W CONTRAST    Standing Status:   Future    Standing Expiration Date:   11/13/2023    Scheduling Instructions:     No need oral contrast    Order Specific Question:   If indicated for the ordered procedure, I authorize the administration of contrast media per Radiology protocol    Answer:   Yes    Order Specific Question:   Preferred imaging location?    Answer:   [facility]    Order Specific Question:   Radiology Contrast Protocol - do NOT remove file path    Answer:   [email or web address]    All questions were answered. The patient knows to call the clinic with any problems, questions or concerns. The total time spent in the appointment was 20 minutes encounter with patients including review of chart and various tests results, discussions about plan of care and coordination of care plan   [name], MD 11/13/2022 9:18 AM  INTERVAL HISTORY: Please see below for problem oriented charting. she returns for treatment follow-up seen prior to cycle 4 of treatment She denies side effects from treatment Denies pelvic pain, vaginal bleeding or discharge  REVIEW OF SYSTEMS:   Constitutional: Denies fevers, chills or abnormal weight loss Eyes: Denies blurriness of vision Ears,  nose, mouth, throat, and face: Denies mucositis or sore throat Respiratory: Denies cough, dyspnea or wheezes Cardiovascular: Denies palpitation, chest discomfort or lower extremity swelling Gastrointestinal:  Denies nausea, heartburn or change in bowel habits Skin: Denies abnormal skin rashes Lymphatics: Denies new lymphadenopathy or easy bruising Neurological:Denies numbness, tingling or new weaknesses Behavioral/Psych: Mood is stable, no new changes  All other systems were reviewed with the patient and are negative.  I have reviewed the past medical history, past surgical history, social history and family history with the patient and they are unchanged from previous note.  ALLERGIES:  has No Known Allergies.  MEDICATIONS:  Current Outpatient Medications  Medication Sig Dispense Refill   levothyroxine (SYNTHROID) 100 MCG tablet Take 1 tablet (100 mcg total) by mouth daily before breakfast. 30 tablet 0   lidocaine-prilocaine (EMLA) cream Apply to affected area once 30 g 3   Multiple Vitamin (MULTIVITAMIN) tablet Take 1 tablet by mouth daily.     ondansetron (ZOFRAN) 8 MG tablet Take 1 tablet (8 mg total) by mouth 2 (two) times daily as needed (Nausea or vomiting). 30 tablet 1   prochlorperazine (COMPAZINE) 10 MG tablet Take 1 tablet (10 mg total) by mouth every 6 (six) hours as needed (Nausea or vomiting). 30 tablet 1   No current facility-administered medications for this visit.   Facility-Administered Medications Ordered in Other Visits  Medication Dose Route Frequency Provider Last Rate Last Admin   cemiplimab-rwlc (LIBTAYO) 350 mg in sodium chloride 0.9 % 100 mL chemo infusion  350 mg Intravenous Once Legna Mausolf,  MD       cyanocobalamin (VITAMIN B12) injection 1,000 mcg  1,000 mcg Intramuscular Once Alvy Bimler, Kelcie Currie, MD       diphenhydrAMINE (BENADRYL) 50 MG/ML injection            famotidine (PEPCID) 20-0.9 MG/50ML-% IVPB            heparin lock flush 100 unit/mL  500 Units  Intracatheter Once PRN Alvy Bimler, Jonathan Corpus, MD       sodium chloride flush (NS) 0.9 % injection 10 mL  10 mL Intracatheter PRN [name], MD        SUMMARY OF ONCOLOGIC HISTORY: Oncology History Overview Note  Cancer Staging Malignant neoplasm of cervix ([place]) Staging form: Cervix Uteri, AJCC 8th Edition - Clinical: Stage IIIB (cT3b, cN1, cM0) - Signed by [name], MD on 05/04/2018  PD-L1 1% Progressed on carboplatin, pembrolizumab, cisplatin and taxol, gemzar   Malignant neoplasm of cervix ([place])  03/13/2018 Imaging   US pelvis There are mobile echogenic foci within the endometrial cavity suggestive of gas. This is nonspecific in etiology however may be secondary to endometritis. Correlate for history of recent instrumentation. This obscures visualization of the endometrial tissue and therefore a follow-up pelvic ultrasound in 2-4 weeks is recommended after resolution of the acute symptomatology if symptoms persist to further evaluate the endometrium.     03/13/2018 Initial Diagnosis   She initially presented with PMB   04/12/2018 Pathology Results   Cervix, biopsy, mass - INVASIVE SQUAMOUS CELL CARCINOMA - SEE COMMENT   04/12/2018 Surgery   PREOPERATIVE DIAGNOSIS:  Postmenopausal vaginal bleeding. POSTOPERATIVE DIAGNOSIS: The same PROCEDURE: Exam under anesthesia, pap smear, cervical mass biopsy SURGEON:  Dr. [name]   INDICATIONS: 72 y.o. yo [id number] with PMB here for exam under anesthesia.Risks of surgery were discussed with the patient including but not limited to: bleeding which may require transfusion; infection which may require antibiotics; injury to uterus or surrounding organs; need for additional procedures including laparotomy or laparoscopy; and other postoperative/anesthesia complications. Written informed consent was obtained.     FINDINGS:  An 8-week size midline uterus.  No adnexal mass palpable on exam. Normal cervix not visualized. Friable mass seen in involving  the vagina at the level of the cervix obliterating the anterior and posterior fornix.   ANESTHESIA:   General INTRAVENOUS FLUIDS:  300 ml of LR ESTIMATED BLOOD LOSS: 20 ml. SPECIMENS: pap smear, cervical mass biopsy COMPLICATIONS:  None immediate.     04/25/2018 PET scan   1. Large cervical mass may invade into the myometrium in down into the vagina, maximum SUV 21.3. There is a malignant left external iliac node measuring 1.4 cm in short axis with maximum SUV 14.1. 2. Accentuated activity in the cecum and ascending colon is likely physiologic given that it has no CT correlate. Correlation with the patient's colon cancer screening history is recommended. If screening is not up-to-date, appropriate screening should be considered. 3.  Aortic Atherosclerosis (ICD10-I70.0).     04/28/2018 Surgery   Pre-operative Diagnosis:  At least Stage 2B SCCa Cervical Cancer   Post-operative Diagnosis: At least clinical Stage 3A SCCa Cervical Cancer   Operation:  Exam under anesthesia due to intolerance for vaginal exam in office Cystoscopy due to concern for extension to posterior bladder (from anterior vaginal wall lesion)   Operative Findings:   Parametrial involvement on right  Right sided subvaginal tumor burden ~3cm. This approximates the right ureteral insertion into the bladder based on my exam during cystoscopy. Extension of  disease down upper half of vagina on lateral and posterior walls. Extension of disease down to lower third of anterior vagina (versus skip lesion) thus making her Stage 3A Cystoscopy revealed patent bilateral ureteral orifices and no bladder mucosa invasion or areas of concern. Rectal exam grossly no disease.     05/04/2018 Cancer Staging   Staging form: Cervix Uteri, AJCC 8th Edition - Clinical: Stage IIIB (cT3b, cN1, cM0) - Signed by [name], MD on 05/04/2018   05/12/2018 Procedure   Successful 8 French right internal jugular vein power port placement with its tip  at the SVC/RA junction   05/13/2018 - 06/24/2018 Chemotherapy   The patient had weekly cisplatin given with concurrent radiation    05/17/2018 - 07/27/2018 Radiation Therapy   Radiation treatment dates:   05/17/2018-07/27/2018   Site/dose: 1. Cervix, 1.8 Gy in 25 fractions for a total dose of 45 Gy                    2. Pelvis  Boost, 1.8 Gy in 5 fractions for a total dose of 9 Gy                    3. Cervix, 5.5 Gy in 5 fractions for a total dose of 27.5 Gy   06/03/2018 Adverse Reaction   She missed her chemo due to severe depression and was sent to the ER for suicidal ideation.  She was seen by psychiatrist.   10/27/2018 PET scan   IMPRESSION: 1. Complete metabolic response to therapy, without residual or recurrent hypermetabolic disease. 2.  Aortic Atherosclerosis (ICD10-I70.0).     11/17/2018 Imaging   Successful right IJ vein Port-A-Cath explant.   03/29/2020 PET scan   1. Hypermetabolic lesion along the right vaginal cuff is highly worrisome for recurrent cervical cancer. No evidence of distant metastatic disease. 2.  Aortic atherosclerosis (ICD10-I70.0).   04/26/2020 - 12/10/2020 Chemotherapy   The patient had CARBOplatin  for chemotherapy treatment.      Genetic Testing   Patient has genetic testing done for PD-L1. Results revealed patient has the following:  PD-L1 combined positive score (CPS): 1%   07/08/2020 Imaging   1. Unchanged post treatment appearance of the pelvis, including a soft tissue nodule in the low right hemipelvis adjacent to the vagina measuring 2.8 x 1.7 cm, previously FDG avid and consistent with malignancy. 2. No evidence of nodal or distant metastatic disease in the abdomen or pelvis. 3. Aortic Atherosclerosis (ICD10-I70.0).   10/14/2020 Imaging   1. Unchanged right eccentric soft tissue mass in the low right hemipelvis centered on the cervix and vagina, measuring 2.9 x 1.9 cm. 2. New small volume fluid within the endometrial cavity, abnormal in the  postmenopausal setting and suggesting obstruction of the cervical os by soft tissue mass. 3. No evidence of lymphadenopathy or distant metastatic disease in the abdomen or pelvis.   Aortic Atherosclerosis (ICD10-I70.0).   12/24/2020 Imaging   1. Abnormal hypodense masslike appearance continuous with hypodense masslike lesion probably centrally within a right eccentric cervix (versus right paracervical mass). The endometrial portion has enlarged compared to 10/14/2020 and active malignancy is suspected. 2. No adenopathy or findings of distant spread. 3. Other imaging findings of potential clinical significance: Aortic Atherosclerosis (ICD10-I70.0). Mild cardiomegaly. Stable scarring in the left lower lobe. Sigmoid colon scattered diverticula. Tarlov cysts at the S2 level. Vertical sclerosis in the sacral ala possibly due to stress fractures or prior radiation therapy.     01/06/2021 -  06/19/2021 Chemotherapy   Patient is on Treatment Plan : UTERINE Pembrolizumab q21d      04/07/2021 Imaging   1. The previously hypermetabolic right vaginal mass currently measures 12 cubic cm in volume, previously 11 cubic cm on 12/23/2020. 2. No new adenopathy or new findings of distant metastatic spread. 3. New wall thickening in several loops of distal ileum suspicious for inflammation/enteritis. 4. Other imaging findings of potential clinical significance: Mild cardiomegaly. Mild sigmoid colon diverticulosis. Stable bony demineralization with vertical sclerosis in the sacral ala likely from old insufficiency fracture.   07/10/2021 Imaging   1. New moderate right-sided hydroureteronephrosis with decreased perfusion to the right kidney compatible with obstructive uropathy.  2. Interval progression of the heterogeneous low-density uterine lesion, with progression of enhancing soft tissue extending inferiorly to the right, compatible with previously described disease in the right vaginal wall. Represents the site of  right ureteral obstruction. 3. No evidence for new metastatic disease in the abdomen or pelvis. 4. Bilateral sacral insufficiency fractures. 5. Trace free fluid in the pelvis. 6. Aortic Atherosclerosis (ICD10-I70.0).     07/22/2021 Procedure   Successful placement of a right internal jugular approach power injectable Port-A-Cath.     07/24/2021 - 10/02/2021 Chemotherapy   Patient is on Treatment Plan : cervical cancer Cisplatin q7d       10/16/2021 Imaging   1. Mild interval increase in size of heterogeneous tumor arising from within the uterus with local tumor extension into the right pelvic sidewall. 2. Persistent right-sided obstructive uropathy with severe right hydronephrosis and progressive right renal cortical volume loss. Right hydroureter extends into the pelvis where it is obstructed by the uterine/cervical mass. 3. Bilateral sacral insufficiency fractures. 4. Aortic Atherosclerosis (ICD10-I70.0).   11/06/2021 - 05/15/2022 Chemotherapy   Patient is on Treatment Plan : Cervical Paclitaxel q7d     02/10/2022 Imaging   1. Slight interval decrease in size of a heterogeneous mass within the right pelvis, involving the adjacent right aspect of the uterus and closely abutting the posterior urinary bladder,, consistent with treatment response. Fat stranding and presacral soft tissue thickening in the adjacent pelvis, unchanged. 2. Unchanged severe right hydronephrosis and hydroureter, the distal right ureter obstructed by mass in the pelvis, again with slight progression of right renal cortical atrophy. 3. Unchanged, mildly sclerotic nondisplaced bilateral sacral insufficiency fractures.   Aortic Atherosclerosis (ICD10-I70.0).   05/28/2022 Imaging   1. Slight interval increase in size of the lower uterine/cervical mass. 2. Persistent severe right-sided hydroureteronephrosis down to the level of the uterine/cervical mass. 3. Stable right-sided posterior bladder wall thickening. 4. No  findings for metastatic disease involving the abdomen/pelvis.   Aortic Atherosclerosis (ICD10-I70.0).   05/29/2022 - 05/29/2022 Chemotherapy   Patient is on Treatment Plan : OVARIAN Paclitaxel (80) [id number] q28d     06/12/2022 - 08/14/2022 Chemotherapy   Patient is on Treatment Plan : Cervix Gemcitabine D1,8 (1000) q21d     08/27/2022 Imaging   1. Progressive enlargement of ill-defined mass involving the lower uterus and cervix with probable progressive parametrial extension on the right causing chronic right ureteral obstruction. Adjacent posterior bladder wall thickening as before, potentially due to direct extension. 2. No evidence of distant metastatic disease or adenopathy. 3. Stable chronic right-sided hydronephrosis with marked right renal cortical thinning. 4. Probable chronic sacral insufficiency fractures and/or sequela of prior radiation therapy. 5.  Aortic Atherosclerosis (ICD10-I70.0).   09/11/2022 -  Chemotherapy   Patient is on Treatment Plan : cervical cancer Cemiplimab q21d  PHYSICAL EXAMINATION: ECOG PERFORMANCE STATUS: 0 - Asymptomatic  Vitals:   11/13/22 0822  BP: 137/86  Pulse: 86  Resp: 18  Temp: 98 F (36.7 C)  SpO2: 100%   Filed Weights   11/13/22 0822  Weight: 90 lb 3.2 oz (40.9 kg)    GENERAL:alert, no distress and comfortable  NEURO: alert & oriented x 3 with fluent speech, no focal motor/sensory deficits  LABORATORY DATA:  I have reviewed the data as listed    Component Value Date/Time   NA 140 10/23/2022 0804   K 3.8 10/23/2022 0804   CL 108 10/23/2022 0804   CO2 24 10/23/2022 0804   GLUCOSE 81 10/23/2022 0804   BUN 17 10/23/2022 0804   CREATININE 1.05 (H) 10/23/2022 0804   CALCIUM 9.1 10/23/2022 0804   PROT 7.4 10/23/2022 0804   ALBUMIN 3.9 10/23/2022 0804   AST 15 10/23/2022 0804   ALT 6 10/23/2022 0804   ALKPHOS 45 10/23/2022 0804   BILITOT 0.4 10/23/2022 0804   GFRNONAA 57 (L) 10/23/2022 0804   GFRAA >60 06/14/2020 0844    GFRAA >60 03/11/2020 0951    No results found for: "SPEP", "UPEP"  Lab Results  Component Value Date   WBC 6.8 11/13/2022   NEUTROABS 5.0 11/13/2022   HGB 10.1 (L) 11/13/2022   HCT 31.6 (L) 11/13/2022   MCV 90.8 11/13/2022   PLT 261 11/13/2022      Chemistry      Component Value Date/Time   NA 140 10/23/2022 0804   K 3.8 10/23/2022 0804   CL 108 10/23/2022 0804   CO2 24 10/23/2022 0804   BUN 17 10/23/2022 0804   CREATININE 1.05 (H) 10/23/2022 0804      Component Value Date/Time   CALCIUM 9.1 10/23/2022 0804   ALKPHOS 45 10/23/2022 0804   AST 15 10/23/2022 0804   ALT 6 10/23/2022 0804   BILITOT 0.4 10/23/2022 0804

## 2022-11-13 NOTE — Assessment & Plan Note (Signed)
She has intermittent abnormal TSH I will adjust the dose of her Synthroid accordingly We will call her with test results today

## 2022-11-14 ENCOUNTER — Other Ambulatory Visit: Payer: Self-pay

## 2022-12-02 ENCOUNTER — Encounter (HOSPITAL_COMMUNITY): Payer: Self-pay

## 2022-12-02 ENCOUNTER — Ambulatory Visit (HOSPITAL_COMMUNITY)
Admission: RE | Admit: 2022-12-02 | Discharge: 2022-12-02 | Disposition: A | Discharge status: Home / Self Care | Payer: Medicare Other | Source: Ambulatory Visit | Attending: Hematology and Oncology | Admitting: Hematology and Oncology

## 2022-12-02 DIAGNOSIS — Z7189 Other specified counseling: Secondary | ICD-10-CM

## 2022-12-02 DIAGNOSIS — C539 Malignant neoplasm of cervix uteri, unspecified: Secondary | ICD-10-CM | POA: Diagnosis present

## 2022-12-02 MED ORDER — SODIUM CHLORIDE (PF) 0.9 % IJ SOLN
INTRAMUSCULAR | Status: AC
  Filled 2022-12-02: qty 50

## 2022-12-02 MED ORDER — IOHEXOL 300 MG/ML  SOLN
75.0000 mL | Freq: Once | INTRAMUSCULAR | Status: AC | PRN
  Administered 2022-12-02: 75 mL via INTRAVENOUS

## 2022-12-02 MED ORDER — HEPARIN SOD (PORK) LOCK FLUSH 100 UNIT/ML IV SOLN: 500.0000 [IU] | Freq: Once | INTRAVENOUS | Status: AC

## 2022-12-02 MED ORDER — IOHEXOL 9 MG/ML PO SOLN
1000.0000 mL | ORAL | Status: AC
  Administered 2022-12-02: 1000 mL via ORAL

## 2022-12-02 MED ORDER — IOHEXOL 9 MG/ML PO SOLN
ORAL | Status: AC
  Filled 2022-12-02: qty 1000

## 2022-12-02 MED ORDER — HEPARIN SOD (PORK) LOCK FLUSH 100 UNIT/ML IV SOLN
INTRAVENOUS | Status: AC
  Administered 2022-12-02: 500 [IU] via INTRAVENOUS
  Filled 2022-12-02: qty 5

## 2022-12-03 ENCOUNTER — Other Ambulatory Visit: Payer: Self-pay | Admitting: Hematology and Oncology

## 2022-12-04 ENCOUNTER — Inpatient Hospital Stay: Payer: Medicare Other

## 2022-12-04 ENCOUNTER — Encounter: Payer: Self-pay | Admitting: Hematology and Oncology

## 2022-12-04 ENCOUNTER — Other Ambulatory Visit: Payer: Self-pay

## 2022-12-04 ENCOUNTER — Inpatient Hospital Stay (HOSPITAL_BASED_OUTPATIENT_CLINIC_OR_DEPARTMENT_OTHER): Payer: Medicare Other | Admitting: Hematology and Oncology

## 2022-12-04 VITALS — BP 141/67 | HR 87 | Temp 99.1°F | Resp 18 | Ht 62.0 in | Wt 90.6 lb

## 2022-12-04 DIAGNOSIS — C539 Malignant neoplasm of cervix uteri, unspecified: Secondary | ICD-10-CM | POA: Diagnosis not present

## 2022-12-04 DIAGNOSIS — Z5112 Encounter for antineoplastic immunotherapy: Secondary | ICD-10-CM | POA: Diagnosis not present

## 2022-12-04 NOTE — Progress Notes (Signed)
[place] OFFICE PROGRESS NOTE  Patient Care Team: [name], MD as PCP - General (Hematology and Oncology)  ASSESSMENT & PLAN:  Malignant neoplasm of cervix [facility]) I have reviewed multiple imaging studies with the patient and her caregiver The patient has progressed on 6 different lines of chemotherapy I do not believe there is any meaningful gain for trying further palliative chemotherapy The only possible drug that could bring any meaningful change to the trajectory of her disease would be Tisotumab However, there is high risk of ocular toxicity Ultimately, there is also consideration to transition her care to palliative care  Unfortunately, neither the patient nor caregiver has full understanding in regards to benefits of palliative care Her caregiver stated in response to suggestion of palliative care, " so do we just watch her die?" We discussed the role of second opinion elsewhere The patient and her caregiver rejected second opinion but then her caregiver stated that he would asked a friend who is a Warden/ranger at [facility] for opinion of how to proceed Currently, she is not symptomatic  I recommend the patient and her caregiver to get back to me with her final decision not more than a month away from her recent imaging study She would also need her blood count monitor as well as her thyroid medication adjusted in the future  No orders of the defined types were placed in this encounter.   All questions were answered. The patient knows to call the clinic with any problems, questions or concerns. The total time spent in the appointment was 40 minutes encounter with patients including review of chart and various tests results, discussions about plan of care and coordination of care plan   [name], MD 12/04/2022 4:12 PM  INTERVAL HISTORY: Please see below for problem oriented charting. she returns for review of test results We discussed imaging study results  and other treatment options  REVIEW OF SYSTEMS:   Constitutional: Denies fevers, chills or abnormal weight loss Eyes: Denies blurriness of vision Ears, nose, mouth, throat, and face: Denies mucositis or sore throat Respiratory: Denies cough, dyspnea or wheezes Cardiovascular: Denies palpitation, chest discomfort or lower extremity swelling Gastrointestinal:  Denies nausea, heartburn or change in bowel habits Skin: Denies abnormal skin rashes Lymphatics: Denies new lymphadenopathy or easy bruising Neurological:Denies numbness, tingling or new weaknesses Behavioral/Psych: Mood is stable, no new changes  All other systems were reviewed with the patient and are negative.  I have reviewed the past medical history, past surgical history, social history and family history with the patient and they are unchanged from previous note.  ALLERGIES:  has No Known Allergies.  MEDICATIONS:  Current Outpatient Medications  Medication Sig Dispense Refill   levothyroxine (SYNTHROID) 100 MCG tablet Take 1 tablet (100 mcg total) by mouth daily before breakfast. 30 tablet 0   lidocaine-prilocaine (EMLA) cream Apply to affected area once 30 g 3   Multiple Vitamin (MULTIVITAMIN) tablet Take 1 tablet by mouth daily.     ondansetron (ZOFRAN) 8 MG tablet Take 1 tablet (8 mg total) by mouth 2 (two) times daily as needed (Nausea or vomiting). 30 tablet 1   prochlorperazine (COMPAZINE) 10 MG tablet Take 1 tablet (10 mg total) by mouth every 6 (six) hours as needed (Nausea or vomiting). 30 tablet 1   No current facility-administered medications for this visit.   Facility-Administered Medications Ordered in Other Visits  Medication Dose Route Frequency Provider Last Rate Last Admin   diphenhydrAMINE (BENADRYL) 50 MG/ML  injection            famotidine (PEPCID) 20-0.9 MG/50ML-% IVPB             SUMMARY OF ONCOLOGIC HISTORY: Oncology History Overview Note  Cancer Staging Malignant neoplasm of cervix  ([place]) Staging form: Cervix Uteri, AJCC 8th Edition - Clinical: Stage IIIB (cT3b, cN1, cM0) - Signed by [name], MD on 05/04/2018  PD-L1 1% Progressed on carboplatin, pembrolizumab, cisplatin, taxol, gemzar and cemiplimab   Malignant neoplasm of cervix ([place])  03/13/2018 Imaging   US pelvis There are mobile echogenic foci within the endometrial cavity suggestive of gas. This is nonspecific in etiology however may be secondary to endometritis. Correlate for history of recent instrumentation. This obscures visualization of the endometrial tissue and therefore a follow-up pelvic ultrasound in 2-4 weeks is recommended after resolution of the acute symptomatology if symptoms persist to further evaluate the endometrium.     03/13/2018 Initial Diagnosis   She initially presented with PMB   04/12/2018 Pathology Results   Cervix, biopsy, mass - INVASIVE SQUAMOUS CELL CARCINOMA - SEE COMMENT   04/12/2018 Surgery   PREOPERATIVE DIAGNOSIS:  Postmenopausal vaginal bleeding. POSTOPERATIVE DIAGNOSIS: The same PROCEDURE: Exam under anesthesia, pap smear, cervical mass biopsy SURGEON:  Dr. [name]   INDICATIONS: 72 y.o. yo [id number] with PMB here for exam under anesthesia.Risks of surgery were discussed with the patient including but not limited to: bleeding which may require transfusion; infection which may require antibiotics; injury to uterus or surrounding organs; need for additional procedures including laparotomy or laparoscopy; and other postoperative/anesthesia complications. Written informed consent was obtained.     FINDINGS:  An 8-week size midline uterus.  No adnexal mass palpable on exam. Normal cervix not visualized. Friable mass seen in involving the vagina at the level of the cervix obliterating the anterior and posterior fornix.   ANESTHESIA:   General INTRAVENOUS FLUIDS:  300 ml of LR ESTIMATED BLOOD LOSS: 20 ml. SPECIMENS: pap smear, cervical mass biopsy COMPLICATIONS:  None  immediate.     04/25/2018 PET scan   1. Large cervical mass may invade into the myometrium in down into the vagina, maximum SUV 21.3. There is a malignant left external iliac node measuring 1.4 cm in short axis with maximum SUV 14.1. 2. Accentuated activity in the cecum and ascending colon is likely physiologic given that it has no CT correlate. Correlation with the patient's colon cancer screening history is recommended. If screening is not up-to-date, appropriate screening should be considered. 3.  Aortic Atherosclerosis (ICD10-I70.0).     04/28/2018 Surgery   Pre-operative Diagnosis:  At least Stage 2B SCCa Cervical Cancer   Post-operative Diagnosis: At least clinical Stage 3A SCCa Cervical Cancer   Operation:  Exam under anesthesia due to intolerance for vaginal exam in office Cystoscopy due to concern for extension to posterior bladder (from anterior vaginal wall lesion)   Operative Findings:   Parametrial involvement on right  Right sided subvaginal tumor burden ~3cm. This approximates the right ureteral insertion into the bladder based on my exam during cystoscopy. Extension of disease down upper half of vagina on lateral and posterior walls. Extension of disease down to lower third of anterior vagina (versus skip lesion) thus making her Stage 3A Cystoscopy revealed patent bilateral ureteral orifices and no bladder mucosa invasion or areas of concern. Rectal exam grossly no disease.     05/04/2018 Cancer Staging   Staging form: Cervix Uteri, AJCC 8th Edition - Clinical: Stage IIIB (cT3b, cN1, cM0) -  Signed by [name], MD on 05/04/2018   05/12/2018 Procedure   Successful 8 French right internal jugular vein power port placement with its tip at the SVC/RA junction   05/13/2018 - 06/24/2018 Chemotherapy   The patient had weekly cisplatin given with concurrent radiation    05/17/2018 - 07/27/2018 Radiation Therapy   Radiation treatment dates:   05/17/2018-07/27/2018   Site/dose:  1. Cervix, 1.8 Gy in 25 fractions for a total dose of 45 Gy                    2. Pelvis  Boost, 1.8 Gy in 5 fractions for a total dose of 9 Gy                    3. Cervix, 5.5 Gy in 5 fractions for a total dose of 27.5 Gy   06/03/2018 Adverse Reaction   She missed her chemo due to severe depression and was sent to the ER for suicidal ideation.  She was seen by psychiatrist.   10/27/2018 PET scan   IMPRESSION: 1. Complete metabolic response to therapy, without residual or recurrent hypermetabolic disease. 2.  Aortic Atherosclerosis (ICD10-I70.0).     11/17/2018 Imaging   Successful right IJ vein Port-A-Cath explant.   03/29/2020 PET scan   1. Hypermetabolic lesion along the right vaginal cuff is highly worrisome for recurrent cervical cancer. No evidence of distant metastatic disease. 2.  Aortic atherosclerosis (ICD10-I70.0).   04/26/2020 - 12/10/2020 Chemotherapy   The patient had CARBOplatin  for chemotherapy treatment.      Genetic Testing   Patient has genetic testing done for PD-L1. Results revealed patient has the following:  PD-L1 combined positive score (CPS): 1%   07/08/2020 Imaging   1. Unchanged post treatment appearance of the pelvis, including a soft tissue nodule in the low right hemipelvis adjacent to the vagina measuring 2.8 x 1.7 cm, previously FDG avid and consistent with malignancy. 2. No evidence of nodal or distant metastatic disease in the abdomen or pelvis. 3. Aortic Atherosclerosis (ICD10-I70.0).   10/14/2020 Imaging   1. Unchanged right eccentric soft tissue mass in the low right hemipelvis centered on the cervix and vagina, measuring 2.9 x 1.9 cm. 2. New small volume fluid within the endometrial cavity, abnormal in the postmenopausal setting and suggesting obstruction of the cervical os by soft tissue mass. 3. No evidence of lymphadenopathy or distant metastatic disease in the abdomen or pelvis.   Aortic Atherosclerosis (ICD10-I70.0).   12/24/2020 Imaging    1. Abnormal hypodense masslike appearance continuous with hypodense masslike lesion probably centrally within a right eccentric cervix (versus right paracervical mass). The endometrial portion has enlarged compared to 10/14/2020 and active malignancy is suspected. 2. No adenopathy or findings of distant spread. 3. Other imaging findings of potential clinical significance: Aortic Atherosclerosis (ICD10-I70.0). Mild cardiomegaly. Stable scarring in the left lower lobe. Sigmoid colon scattered diverticula. Tarlov cysts at the S2 level. Vertical sclerosis in the sacral ala possibly due to stress fractures or prior radiation therapy.     01/06/2021 - 06/19/2021 Chemotherapy   Patient is on Treatment Plan : UTERINE Pembrolizumab q21d      04/07/2021 Imaging   1. The previously hypermetabolic right vaginal mass currently measures 12 cubic cm in volume, previously 11 cubic cm on 12/23/2020. 2. No new adenopathy or new findings of distant metastatic spread. 3. New wall thickening in several loops of distal ileum suspicious for inflammation/enteritis. 4. Other imaging findings of potential clinical  significance: Mild cardiomegaly. Mild sigmoid colon diverticulosis. Stable bony demineralization with vertical sclerosis in the sacral ala likely from old insufficiency fracture.   07/10/2021 Imaging   1. New moderate right-sided hydroureteronephrosis with decreased perfusion to the right kidney compatible with obstructive uropathy.  2. Interval progression of the heterogeneous low-density uterine lesion, with progression of enhancing soft tissue extending inferiorly to the right, compatible with previously described disease in the right vaginal wall. Represents the site of right ureteral obstruction. 3. No evidence for new metastatic disease in the abdomen or pelvis. 4. Bilateral sacral insufficiency fractures. 5. Trace free fluid in the pelvis. 6. Aortic Atherosclerosis (ICD10-I70.0).     07/22/2021  Procedure   Successful placement of a right internal jugular approach power injectable Port-A-Cath.     07/24/2021 - 10/02/2021 Chemotherapy   Patient is on Treatment Plan : cervical cancer Cisplatin q7d       10/16/2021 Imaging   1. Mild interval increase in size of heterogeneous tumor arising from within the uterus with local tumor extension into the right pelvic sidewall. 2. Persistent right-sided obstructive uropathy with severe right hydronephrosis and progressive right renal cortical volume loss. Right hydroureter extends into the pelvis where it is obstructed by the uterine/cervical mass. 3. Bilateral sacral insufficiency fractures. 4. Aortic Atherosclerosis (ICD10-I70.0).   11/06/2021 - 05/15/2022 Chemotherapy   Patient is on Treatment Plan : Cervical Paclitaxel q7d     02/10/2022 Imaging   1. Slight interval decrease in size of a heterogeneous mass within the right pelvis, involving the adjacent right aspect of the uterus and closely abutting the posterior urinary bladder,, consistent with treatment response. Fat stranding and presacral soft tissue thickening in the adjacent pelvis, unchanged. 2. Unchanged severe right hydronephrosis and hydroureter, the distal right ureter obstructed by mass in the pelvis, again with slight progression of right renal cortical atrophy. 3. Unchanged, mildly sclerotic nondisplaced bilateral sacral insufficiency fractures.   Aortic Atherosclerosis (ICD10-I70.0).   05/28/2022 Imaging   1. Slight interval increase in size of the lower uterine/cervical mass. 2. Persistent severe right-sided hydroureteronephrosis down to the level of the uterine/cervical mass. 3. Stable right-sided posterior bladder wall thickening. 4. No findings for metastatic disease involving the abdomen/pelvis.   Aortic Atherosclerosis (ICD10-I70.0).   05/29/2022 - 05/29/2022 Chemotherapy   Patient is on Treatment Plan : OVARIAN Paclitaxel (80) [id number] q28d     06/12/2022 -  08/14/2022 Chemotherapy   Patient is on Treatment Plan : Cervix Gemcitabine D1,8 (1000) q21d     08/27/2022 Imaging   1. Progressive enlargement of ill-defined mass involving the lower uterus and cervix with probable progressive parametrial extension on the right causing chronic right ureteral obstruction. Adjacent posterior bladder wall thickening as before, potentially due to direct extension. 2. No evidence of distant metastatic disease or adenopathy. 3. Stable chronic right-sided hydronephrosis with marked right renal cortical thinning. 4. Probable chronic sacral insufficiency fractures and/or sequela of prior radiation therapy. 5.  Aortic Atherosclerosis (ICD10-I70.0).   09/11/2022 - 11/13/2022 Chemotherapy   Patient is on Treatment Plan : cervical cancer Cemiplimab q21d     12/03/2022 Imaging   Mild increase in size of central pelvic mass which involves the uterus, vagina, distal right ureter, and right posterior bladder wall.   Stable 7 mm left parametrial lymph node. No other sites of lymphadenopathy identified.   Chronic right renal atrophy and severe hydroureteronephrosis due to distal right ureteral obstruction by the pelvic mass.    Subtle tiny low-attenuation lesion in left hepatic lobe, which was  not seen on previous study. This is nonspecific, and liver metastasis cannot be excluded. Consider continued short-term follow-up CT, or further characterization with abdomen MRI without and with contrast.     PHYSICAL EXAMINATION: ECOG PERFORMANCE STATUS: 1 - Symptomatic but completely ambulatory  Vitals:   12/04/22 0830  BP: (!) 141/67  Pulse: 87  Resp: 18  Temp: 99.1 F (37.3 C)  SpO2: 97%   Filed Weights   12/04/22 0830  Weight: 90 lb 9.6 oz (41.1 kg)    GENERAL:alert, no distress and comfortable  NEURO: alert & oriented x 3 with fluent speech, no focal motor/sensory deficits  LABORATORY DATA:  I have reviewed the data as listed    Component Value Date/Time   NA  138 11/13/2022 0804   K 4.1 11/13/2022 0804   CL 105 11/13/2022 0804   CO2 25 11/13/2022 0804   GLUCOSE 91 11/13/2022 0804   BUN 28 (H) 11/13/2022 0804   CREATININE 1.26 (H) 11/13/2022 0804   CALCIUM 8.8 (L) 11/13/2022 0804   PROT 7.5 11/13/2022 0804   ALBUMIN 3.4 (L) 11/13/2022 0804   AST 16 11/13/2022 0804   ALT 8 11/13/2022 0804   ALKPHOS 53 11/13/2022 0804   BILITOT 0.4 11/13/2022 0804   GFRNONAA 46 (L) 11/13/2022 0804   GFRAA >60 06/14/2020 0844   GFRAA >60 03/11/2020 0951    No results found for: "SPEP", "UPEP"  Lab Results  Component Value Date   WBC 6.8 11/13/2022   NEUTROABS 5.0 11/13/2022   HGB 10.1 (L) 11/13/2022   HCT 31.6 (L) 11/13/2022   MCV 90.8 11/13/2022   PLT 261 11/13/2022      Chemistry      Component Value Date/Time   NA 138 11/13/2022 0804   K 4.1 11/13/2022 0804   CL 105 11/13/2022 0804   CO2 25 11/13/2022 0804   BUN 28 (H) 11/13/2022 0804   CREATININE 1.26 (H) 11/13/2022 0804      Component Value Date/Time   CALCIUM 8.8 (L) 11/13/2022 0804   ALKPHOS 53 11/13/2022 0804   AST 16 11/13/2022 0804   ALT 8 11/13/2022 0804   BILITOT 0.4 11/13/2022 0804       RADIOGRAPHIC STUDIES: I have personally reviewed the radiological images as listed and agreed with the findings in the report. CT ABDOMEN PELVIS W CONTRAST  Result Date: 12/03/2022 CLINICAL DATA:  Follow-up cervical carcinoma. Ongoing chemotherapy. Assess treatment response. * Tracking Code: BO * EXAM: CT ABDOMEN AND PELVIS WITH CONTRAST TECHNIQUE: Multidetector CT imaging of the abdomen and pelvis was performed using the standard protocol following bolus administration of intravenous contrast. RADIATION DOSE REDUCTION: This exam was performed according to the departmental dose-optimization program which includes automated exposure control, adjustment of the mA and/or kV according to patient size and/or use of iterative reconstruction technique. CONTRAST:  44mL OMNIPAQUE IOHEXOL 300 MG/ML   SOLN COMPARISON:  08/26/2022 FINDINGS: Lower Chest: No acute findings. Hepatobiliary: A subtle low-attenuation lesion is seen in segment 3 of the left hepatic lobe measuring 8 x 8 mm on image 24/2, which was not seen on previous study. No other hepatic masses are identified. Gallbladder is unremarkable. No evidence of biliary ductal dilatation. Pancreas:  No mass or inflammatory changes. Spleen: Within normal limits in size and appearance. Adrenals/Urinary Tract: No suspicious masses identified. Chronic right renal atrophy and severe right hydroureteronephrosis due to right pelvic mass is unchanged since prior study. Stomach/Bowel: No evidence of obstruction, inflammatory process or abnormal fluid collections. Vascular/Lymphatic: 7 mm  left parametrial lymph node on image 61/2 shows no significant change. No other sites of lymphadenopathy identified. No acute vascular findings. Reproductive: Centrally necrotic soft tissue mass is again seen in the inferior pelvis which involves the uterus and vagina, and obstructs the distal right ureter. This mass is also contiguous with the right posterior bladder wall which appears thickened, suspicious for direct bladder involvement. This mass measures 7.7 x 4.9 cm on image 59/2 increased in size from 6.2 x 4.1 cm on prior study. Tiny amount of free pelvic fluid again noted. Other:  None. Musculoskeletal:  No suspicious bone lesions identified. IMPRESSION: Mild increase in size of central pelvic mass which involves the uterus, vagina, distal right ureter, and right posterior bladder wall. Stable 7 mm left parametrial lymph node. No other sites of lymphadenopathy identified. Chronic right renal atrophy and severe hydroureteronephrosis due to distal right ureteral obstruction by the pelvic mass. Subtle tiny low-attenuation lesion in left hepatic lobe, which was not seen on previous study. This is nonspecific, and liver metastasis cannot be excluded. Consider continued short-term  follow-up CT, or further characterization with abdomen MRI without and with contrast. Electronically Signed   By: Marlaine Hind M.D.   On: 12/03/2022 13:01

## 2022-12-04 NOTE — Assessment & Plan Note (Signed)
I have reviewed multiple imaging studies with the patient and her caregiver The patient has progressed on 6 different lines of chemotherapy I do not believe there is any meaningful gain for trying further palliative chemotherapy The only possible drug that could bring any meaningful change to the trajectory of her disease would be Tisotumab However, there is high risk of ocular toxicity Ultimately, there is also consideration to transition her care to palliative care  Unfortunately, neither the patient nor caregiver has full understanding in regards to benefits of palliative care Her caregiver stated in response to suggestion of palliative care, " so do we just watch her die?" We discussed the role of second opinion elsewhere The patient and her caregiver rejected second opinion but then her caregiver stated that he would asked a friend who is a Warden/ranger at [facility] for opinion of how to proceed Currently, she is not symptomatic  I recommend the patient and her caregiver to get back to me with her final decision not more than a month away from her recent imaging study She would also need her blood count monitor as well as her thyroid medication adjusted in the future

## 2023-04-23 IMAGING — CT CT CHEST-ABD-PELV W/ CM
3 of 5 series · 14 of 36 positions shown, 16 images · IV contrast (OMNIPAQUE)
Comparison: Multiple exams, including 12/23/2020 and PET-CT from
03/29/2020

CLINICAL DATA: Cervical cancer diagnosed in [id number], restaging
assessment. Chemotherapy and radiation therapy.

EXAM:
CT CHEST, ABDOMEN, AND PELVIS WITH CONTRAST
TECHNIQUE: Multidetector CT imaging of the chest, abdomen and pelvis was
performed following the standard protocol during bolus
administration of intravenous contrast.
CONTRAST:  80mL OMNIPAQUE IOHEXOL 350 MG/ML SOLN

[Series 2: cap with · axial · 0.66mm/px · z∈[-743,-263]mm · 9 of 121 slices shown, 11 images]
[im 13/121  mediastinal]
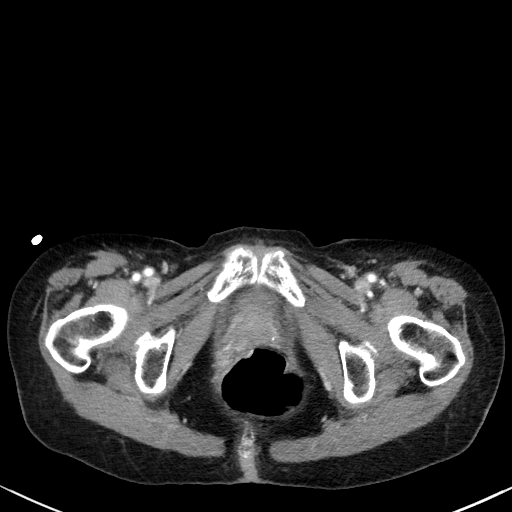
[im 13/121  bone]
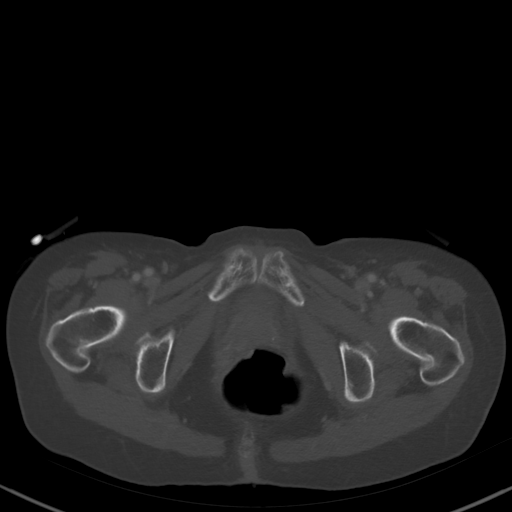
[im 25/121  mediastinal]
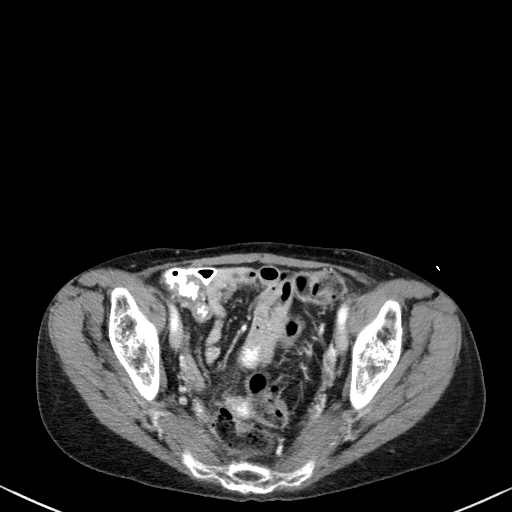
[im 37/121  mediastinal]
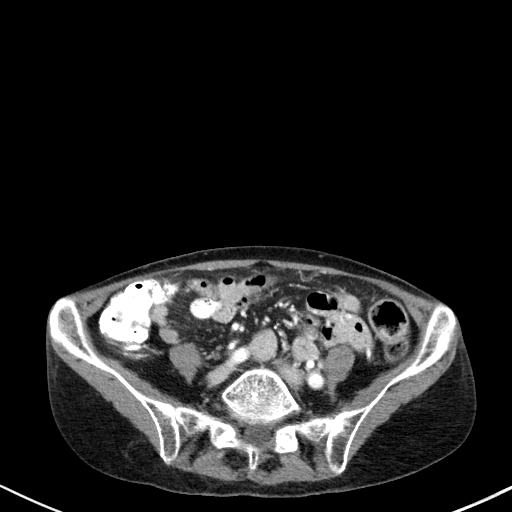
[im 49/121  mediastinal]
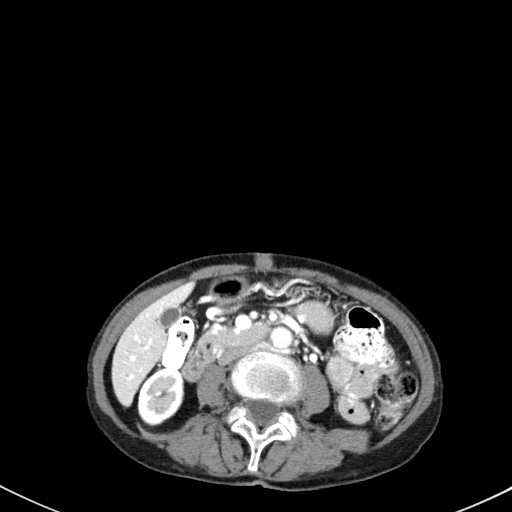
[im 61/121  mediastinal]
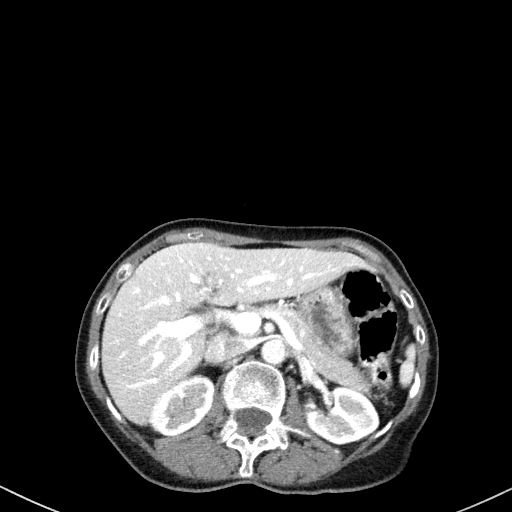
[im 73/121  mediastinal]
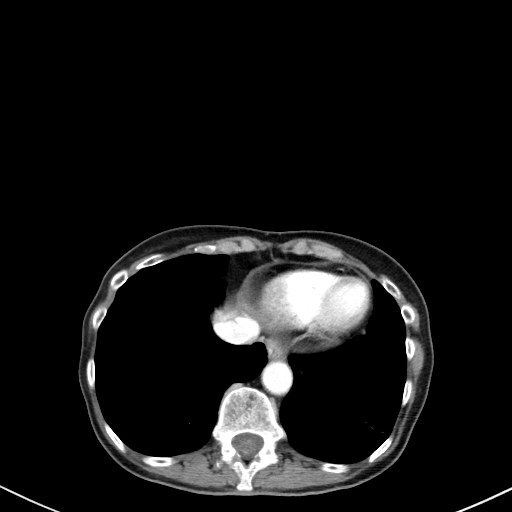
[im 85/121  mediastinal]
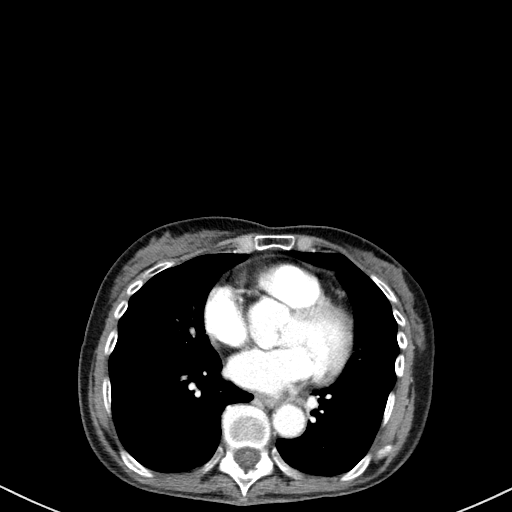
[im 97/121  mediastinal]
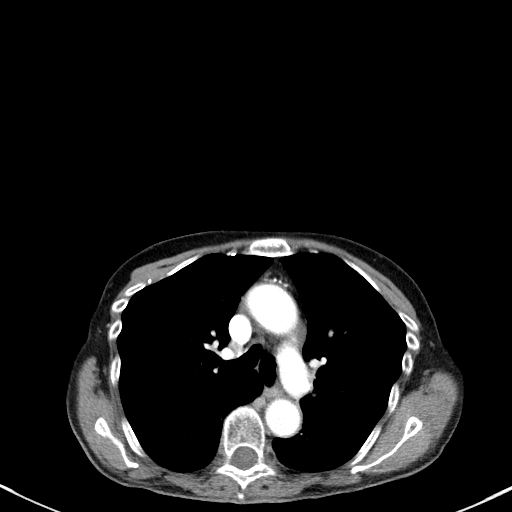
[im 109/121  mediastinal]
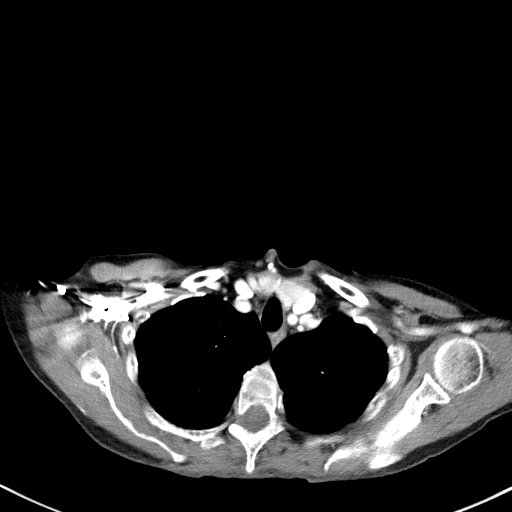
[im 109/121  bone]
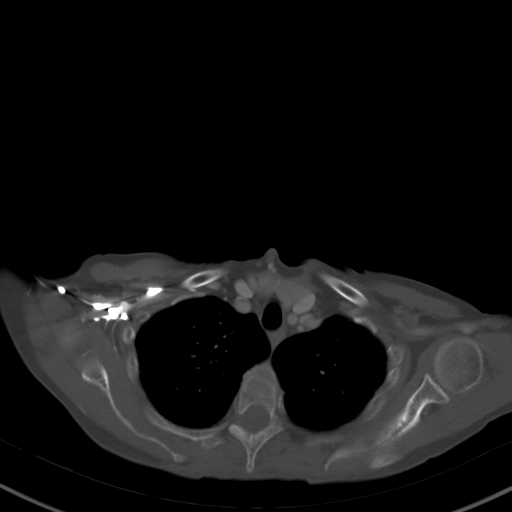

[Series 5: coronals · coronal · 0.71mm/px · 3 of 86 slices shown]
[im 18/86  mediastinal]
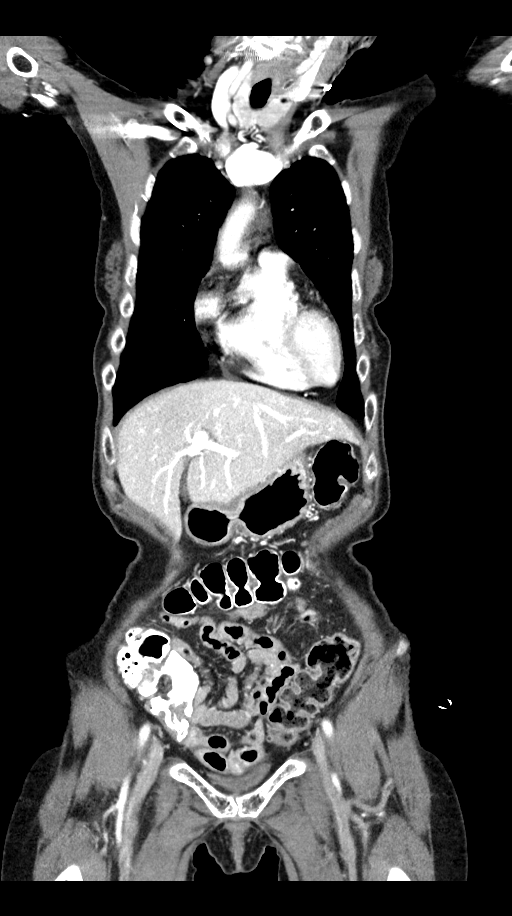
[im 35/86  mediastinal]
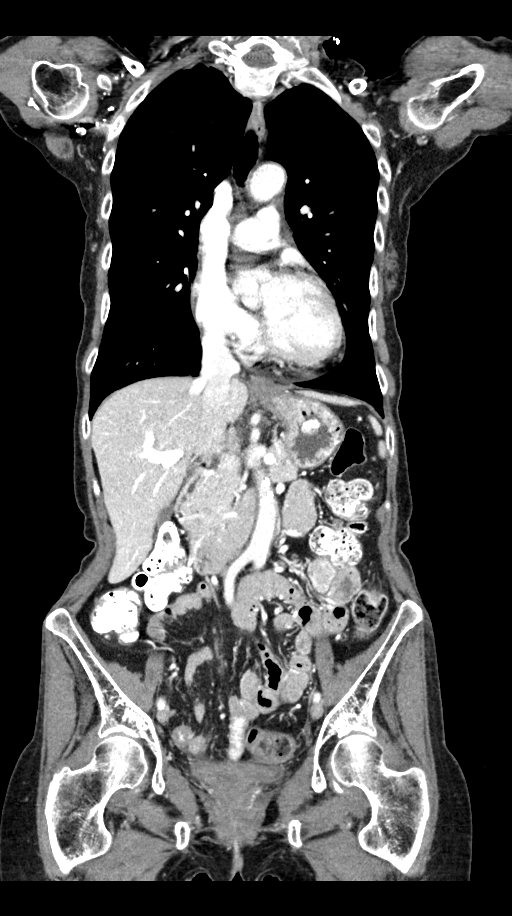
[im 52/86  mediastinal]
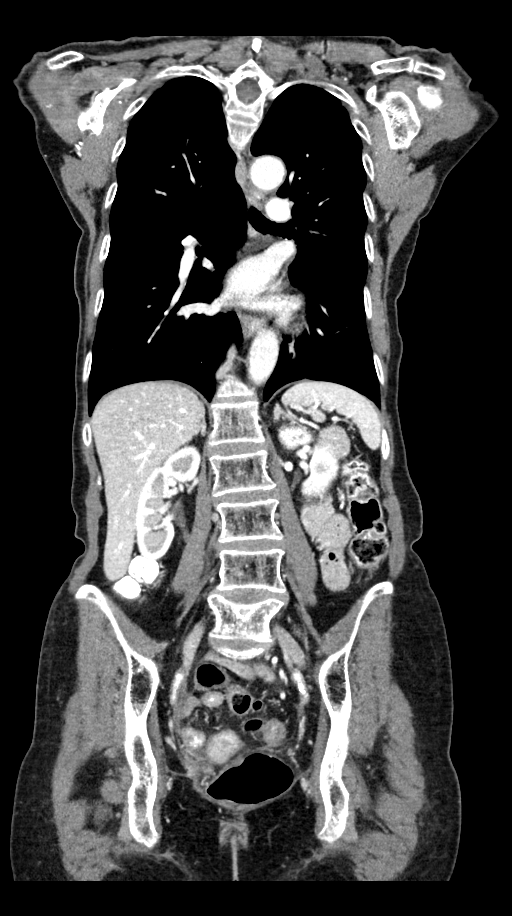

[Series 7: lung · axial · 0.66mm/px · z∈[-489,-443]mm · 2 of 149 slices shown]
[im 12/149  bone]
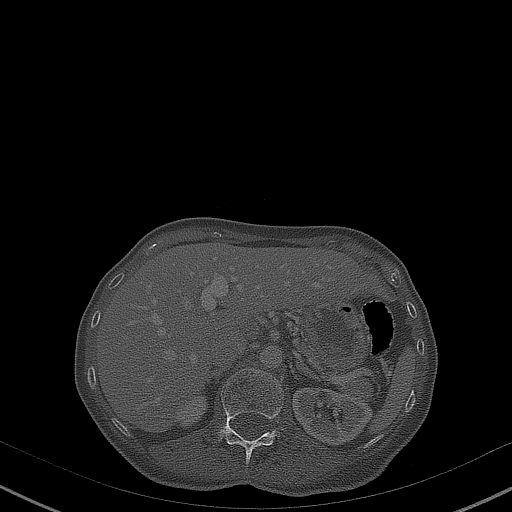
[im 35/149  bone]
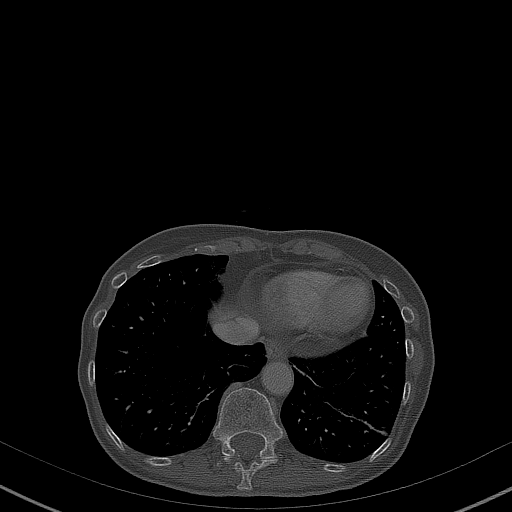

[14 of 36 positions shown; findings below may reference images not displayed]

FINDINGS: CT CHEST FINDINGS

Cardiovascular: Mild thoracic aortic atherosclerotic calcification.
Mild cardiomegaly.

Mediastinum/Nodes: Unremarkable

Lungs/Pleura: Mild biapical pleuroparenchymal scarring, right
greater than left. Mild scarring in the lingula and left lower lobe.

Musculoskeletal: Unremarkable

CT ABDOMEN PELVIS FINDINGS

Hepatobiliary: Unremarkable

Pancreas: Unremarkable

Spleen: Unremarkable

Adrenals/Urinary Tract: Unremarkable

Stomach/Bowel: Mild sigmoid colon diverticulosis. Wall thickening in
the distal ileum suggesting distal enteritis.

Vascular/Lymphatic: Aortoiliac atherosclerotic vascular disease. No
pathologic adenopathy is identified.

Reproductive: Residual hypodensity along the endometrium
approximately 1.6 by 1.4 cm on image 102 of series 2. Continued
abnormal soft tissue density with central lucency along the right
vaginal fornix/vaginal wall, overall size 3.2 by 2.3 by 3.1 cm
(volume = 12 cm^3), with a lucent central portion measuring 2.1 by
1.0 by 1.9 cm. When I compare this to the 12/23/2020 exam, the
abnormal enhancing right vaginal mass previously measured 3.2 by
by 2.8 cm (volume = 11 cm^3).

Other: No supplemental non-categorized findings.

Musculoskeletal: Bony demineralization. Tarlov cyst eccentric to the
left at the S2 level. Stable vertical sclerosis in the sacral ala
likely from old insufficiency fracture. Old superior endplate
compression fracture at L5, unchanged.
IMPRESSION: 1. The previously hypermetabolic right vaginal mass currently
measures 12 cubic cm in volume, previously 11 cubic cm on
12/23/2020.
2. No new adenopathy or new findings of distant metastatic spread.
3. New wall thickening in several loops of distal ileum suspicious
for inflammation/enteritis.
4. Other imaging findings of potential clinical significance: Mild
cardiomegaly. Mild sigmoid colon diverticulosis. Stable bony
demineralization with vertical sclerosis in the sacral ala likely
from old insufficiency fracture.

## 2023-08-04 ENCOUNTER — Encounter: Payer: Self-pay | Admitting: Hematology and Oncology

## 2023-08-04 NOTE — Telephone Encounter (Signed)
Telephone call
